# Patient Record
Sex: Female | Born: 1945 | Race: White | Hispanic: No | Smoking: Never smoker
Health system: Southern US, Community
[De-identification: ages and names within clinical notes are randomized; demographics above are authoritative.]

## PROBLEM LIST (undated history)

## (undated) DIAGNOSIS — R112 Nausea with vomiting, unspecified: Secondary | ICD-10-CM

## (undated) DIAGNOSIS — R590 Localized enlarged lymph nodes: Secondary | ICD-10-CM

## (undated) DIAGNOSIS — Z780 Asymptomatic menopausal state: Secondary | ICD-10-CM

## (undated) DIAGNOSIS — M4317 Spondylolisthesis, lumbosacral region: Secondary | ICD-10-CM

## (undated) DIAGNOSIS — I10 Essential (primary) hypertension: Secondary | ICD-10-CM

## (undated) DIAGNOSIS — H539 Unspecified visual disturbance: Secondary | ICD-10-CM

## (undated) DIAGNOSIS — Z9889 Other specified postprocedural states: Secondary | ICD-10-CM

## (undated) DIAGNOSIS — J45909 Unspecified asthma, uncomplicated: Secondary | ICD-10-CM

## (undated) DIAGNOSIS — K219 Gastro-esophageal reflux disease without esophagitis: Secondary | ICD-10-CM

## (undated) DIAGNOSIS — N200 Calculus of kidney: Secondary | ICD-10-CM

## (undated) DIAGNOSIS — E785 Hyperlipidemia, unspecified: Secondary | ICD-10-CM

## (undated) DIAGNOSIS — R7303 Prediabetes: Secondary | ICD-10-CM

## (undated) DIAGNOSIS — R7309 Other abnormal glucose: Secondary | ICD-10-CM

## (undated) DIAGNOSIS — N6009 Solitary cyst of unspecified breast: Secondary | ICD-10-CM

## (undated) DIAGNOSIS — Z9289 Personal history of other medical treatment: Secondary | ICD-10-CM

## (undated) DIAGNOSIS — M8580 Other specified disorders of bone density and structure, unspecified site: Secondary | ICD-10-CM

## (undated) DIAGNOSIS — T7840XA Allergy, unspecified, initial encounter: Secondary | ICD-10-CM

## (undated) HISTORY — DX: Other abnormal glucose: R73.09

## (undated) HISTORY — DX: Spondylolisthesis, lumbosacral region: M43.17

## (undated) HISTORY — DX: Other specified disorders of bone density and structure, unspecified site: M85.80

## (undated) HISTORY — DX: Unspecified visual disturbance: H53.9

## (undated) HISTORY — DX: Unspecified asthma, uncomplicated: J45.909

## (undated) HISTORY — PX: LYMPH NODE BIOPSY: SHX201

## (undated) HISTORY — DX: Personal history of other medical treatment: Z92.89

## (undated) HISTORY — DX: Calculus of kidney: N20.0

## (undated) HISTORY — DX: Asymptomatic menopausal state: Z78.0

## (undated) HISTORY — DX: Solitary cyst of unspecified breast: N60.09

## (undated) HISTORY — DX: Localized enlarged lymph nodes: R59.0

## (undated) HISTORY — PX: CHOLECYSTECTOMY: SHX55

## (undated) HISTORY — DX: Hyperlipidemia, unspecified: E78.5

## (undated) HISTORY — DX: Gastro-esophageal reflux disease without esophagitis: K21.9

## (undated) HISTORY — DX: Allergy, unspecified, initial encounter: T78.40XA

## (undated) HISTORY — DX: Essential (primary) hypertension: I10

---

## 1982-06-13 DIAGNOSIS — N200 Calculus of kidney: Secondary | ICD-10-CM

## 1982-06-13 HISTORY — DX: Calculus of kidney: N20.0

## 1982-06-13 HISTORY — PX: LITHOTRIPSY: SUR834

## 1998-11-30 ENCOUNTER — Other Ambulatory Visit: Admission: RE | Admit: 1998-11-30 | Discharge: 1998-11-30 | Payer: Self-pay | Admitting: Internal Medicine

## 1999-08-24 ENCOUNTER — Encounter: Admission: RE | Admit: 1999-08-24 | Discharge: 1999-08-24 | Payer: Self-pay | Admitting: Cardiovascular Disease

## 1999-12-16 ENCOUNTER — Encounter: Payer: Self-pay | Admitting: Internal Medicine

## 1999-12-16 ENCOUNTER — Encounter: Admission: RE | Admit: 1999-12-16 | Discharge: 1999-12-16 | Payer: Self-pay | Admitting: Internal Medicine

## 2000-10-09 ENCOUNTER — Encounter (INDEPENDENT_AMBULATORY_CARE_PROVIDER_SITE_OTHER): Payer: Self-pay

## 2000-10-09 ENCOUNTER — Ambulatory Visit (HOSPITAL_COMMUNITY): Admission: RE | Admit: 2000-10-09 | Discharge: 2000-10-09 | Payer: Self-pay | Admitting: Gastroenterology

## 2000-12-20 ENCOUNTER — Encounter: Admission: RE | Admit: 2000-12-20 | Discharge: 2000-12-20 | Payer: Self-pay | Admitting: Emergency Medicine

## 2000-12-20 ENCOUNTER — Encounter: Payer: Self-pay | Admitting: Emergency Medicine

## 2002-01-25 ENCOUNTER — Encounter: Payer: Self-pay | Admitting: Family Medicine

## 2002-01-25 ENCOUNTER — Encounter: Admission: RE | Admit: 2002-01-25 | Discharge: 2002-01-25 | Payer: Self-pay | Admitting: Family Medicine

## 2002-04-11 ENCOUNTER — Encounter: Admission: RE | Admit: 2002-04-11 | Discharge: 2002-04-11 | Payer: Self-pay | Admitting: Gastroenterology

## 2002-04-11 ENCOUNTER — Encounter: Payer: Self-pay | Admitting: Gastroenterology

## 2002-05-29 ENCOUNTER — Encounter: Payer: Self-pay | Admitting: Surgery

## 2002-06-03 ENCOUNTER — Ambulatory Visit (HOSPITAL_COMMUNITY): Admission: RE | Admit: 2002-06-03 | Discharge: 2002-06-04 | Payer: Self-pay | Admitting: Surgery

## 2002-06-03 ENCOUNTER — Encounter (INDEPENDENT_AMBULATORY_CARE_PROVIDER_SITE_OTHER): Payer: Self-pay | Admitting: *Deleted

## 2003-01-28 ENCOUNTER — Encounter: Payer: Self-pay | Admitting: Family Medicine

## 2003-01-28 ENCOUNTER — Encounter: Admission: RE | Admit: 2003-01-28 | Discharge: 2003-01-28 | Payer: Self-pay | Admitting: Family Medicine

## 2004-01-29 ENCOUNTER — Encounter: Admission: RE | Admit: 2004-01-29 | Discharge: 2004-01-29 | Payer: Self-pay | Admitting: Family Medicine

## 2004-04-23 ENCOUNTER — Other Ambulatory Visit: Admission: RE | Admit: 2004-04-23 | Discharge: 2004-04-23 | Payer: Self-pay | Admitting: Family Medicine

## 2005-03-03 ENCOUNTER — Encounter: Admission: RE | Admit: 2005-03-03 | Discharge: 2005-03-03 | Payer: Self-pay | Admitting: Family Medicine

## 2005-04-28 ENCOUNTER — Other Ambulatory Visit: Admission: RE | Admit: 2005-04-28 | Discharge: 2005-04-28 | Payer: Self-pay | Admitting: Family Medicine

## 2005-09-14 ENCOUNTER — Ambulatory Visit: Payer: Self-pay | Admitting: Cardiovascular Disease

## 2006-03-08 ENCOUNTER — Encounter: Admission: RE | Admit: 2006-03-08 | Discharge: 2006-03-08 | Payer: Self-pay | Admitting: Family Medicine

## 2006-07-12 ENCOUNTER — Other Ambulatory Visit: Admission: RE | Admit: 2006-07-12 | Discharge: 2006-07-12 | Payer: Self-pay | Admitting: Family Medicine

## 2006-07-21 ENCOUNTER — Ambulatory Visit (HOSPITAL_COMMUNITY): Admission: RE | Admit: 2006-07-21 | Discharge: 2006-07-21 | Payer: Self-pay | Admitting: Family Medicine

## 2006-10-31 ENCOUNTER — Ambulatory Visit (HOSPITAL_COMMUNITY): Admission: RE | Admit: 2006-10-31 | Discharge: 2006-10-31 | Payer: Self-pay | Admitting: Urology

## 2007-04-24 ENCOUNTER — Encounter: Admission: RE | Admit: 2007-04-24 | Discharge: 2007-04-24 | Payer: Self-pay | Admitting: Family Medicine

## 2007-06-14 DIAGNOSIS — Z9289 Personal history of other medical treatment: Secondary | ICD-10-CM

## 2007-06-14 HISTORY — DX: Personal history of other medical treatment: Z92.89

## 2008-02-01 ENCOUNTER — Ambulatory Visit: Payer: Self-pay | Admitting: Cardiovascular Disease

## 2008-02-01 LAB — CONVERTED CEMR LAB
BUN: 15 mg/dL (ref 6–23)
CO2: 31 meq/L (ref 19–32)
Calcium: 9.5 mg/dL (ref 8.4–10.5)
Creatinine, Ser: 0.9 mg/dL (ref 0.4–1.2)
GFR calc non Af Amer: 68 mL/min

## 2008-02-07 ENCOUNTER — Ambulatory Visit: Payer: Self-pay | Admitting: Cardiovascular Disease

## 2008-02-07 ENCOUNTER — Ambulatory Visit (HOSPITAL_COMMUNITY): Admission: RE | Admit: 2008-02-07 | Discharge: 2008-02-07 | Payer: Self-pay | Admitting: Cardiovascular Disease

## 2008-04-24 ENCOUNTER — Encounter: Admission: RE | Admit: 2008-04-24 | Discharge: 2008-04-24 | Payer: Self-pay | Admitting: Family Medicine

## 2008-12-03 ENCOUNTER — Ambulatory Visit (HOSPITAL_COMMUNITY): Admission: RE | Admit: 2008-12-03 | Discharge: 2008-12-03 | Payer: Self-pay | Admitting: Family Medicine

## 2009-04-30 ENCOUNTER — Encounter: Admission: RE | Admit: 2009-04-30 | Discharge: 2009-04-30 | Payer: Self-pay | Admitting: Family Medicine

## 2010-04-06 ENCOUNTER — Ambulatory Visit (HOSPITAL_COMMUNITY)
Admission: RE | Admit: 2010-04-06 | Discharge: 2010-04-06 | Payer: Self-pay | Source: Home / Self Care | Admitting: Surgery

## 2010-04-14 ENCOUNTER — Ambulatory Visit: Payer: Self-pay | Admitting: Oncology

## 2010-04-15 LAB — CBC WITH DIFFERENTIAL/PLATELET
BASO%: 0.2 % (ref 0.0–2.0)
EOS%: 5.9 % (ref 0.0–7.0)
LYMPH%: 14.7 % (ref 14.0–49.7)
MCH: 30.4 pg (ref 25.1–34.0)
MCHC: 34.2 g/dL (ref 31.5–36.0)
MONO#: 0.9 10*3/uL (ref 0.1–0.9)
NEUT%: 68.9 % (ref 38.4–76.8)
Platelets: 255 10*3/uL (ref 145–400)
RBC: 4.45 10*6/uL (ref 3.70–5.45)
WBC: 8.3 10*3/uL (ref 3.9–10.3)

## 2010-04-15 LAB — MORPHOLOGY: PLT EST: ADEQUATE

## 2010-04-16 LAB — COMPREHENSIVE METABOLIC PANEL
AST: 17 U/L (ref 0–37)
Albumin: 4.5 g/dL (ref 3.5–5.2)
BUN: 17 mg/dL (ref 6–23)
CO2: 23 mEq/L (ref 19–32)
Calcium: 9 mg/dL (ref 8.4–10.5)
Chloride: 106 mEq/L (ref 96–112)
Creatinine, Ser: 0.73 mg/dL (ref 0.40–1.20)
Glucose, Bld: 111 mg/dL — ABNORMAL HIGH (ref 70–99)
Potassium: 4.2 mEq/L (ref 3.5–5.3)

## 2010-04-16 LAB — ANA: Anti Nuclear Antibody(ANA): NEGATIVE

## 2010-04-16 LAB — RHEUMATOID FACTOR: Rhuematoid fact SerPl-aCnc: <20 IU/mL (ref 0–20)

## 2010-04-16 LAB — URIC ACID: Uric Acid, Serum: 4.2 mg/dL (ref 2.4–7.0)

## 2010-04-16 LAB — SEDIMENTATION RATE: Sed Rate: 4 mm/hr (ref 0–22)

## 2010-04-16 LAB — LACTATE DEHYDROGENASE: LDH: 161 U/L (ref 94–250)

## 2010-04-29 LAB — EPSTEIN-BARR VIRUS NUCLEAR ANTIGEN ANTIBODY, IGG: EBV NA IgG: 3.42 {ISR} — ABNORMAL HIGH

## 2010-04-29 LAB — EPSTEIN-BARR VIRUS VCA, IGG: EBV VCA IgG: 1.24 {ISR} — ABNORMAL HIGH

## 2010-04-29 LAB — EPSTEIN-BARR VIRUS EARLY D ANTIGEN ANTIBODY, IGG: EBV EA IgG: 0.84 {ISR}

## 2010-04-29 LAB — EPSTEIN-BARR VIRUS VCA, IGM: EBV VCA IgM: 0.18 {ISR}

## 2010-05-03 ENCOUNTER — Encounter: Admission: RE | Admit: 2010-05-03 | Discharge: 2010-05-03 | Payer: Self-pay | Admitting: Family Medicine

## 2010-06-24 ENCOUNTER — Ambulatory Visit: Payer: Self-pay | Admitting: Oncology

## 2010-06-28 LAB — CBC WITH DIFFERENTIAL/PLATELET
BASO%: 0.2 % (ref 0.0–2.0)
Basophils Absolute: 0 10*3/uL (ref 0.0–0.1)
EOS%: 2.5 % (ref 0.0–7.0)
Eosinophils Absolute: 0.2 10*3/uL (ref 0.0–0.5)
HCT: 42.8 % (ref 34.8–46.6)
HGB: 14.6 g/dL (ref 11.6–15.9)
LYMPH%: 15.3 % (ref 14.0–49.7)
MCH: 30.1 pg (ref 25.1–34.0)
MCHC: 34 g/dL (ref 31.5–36.0)
MCV: 88.4 fL (ref 79.5–101.0)
MONO#: 0.6 10*3/uL (ref 0.1–0.9)
MONO%: 8.2 % (ref 0.0–14.0)
NEUT#: 5 10*3/uL (ref 1.5–6.5)
NEUT%: 73.8 % (ref 38.4–76.8)
Platelets: 216 10*3/uL (ref 145–400)
RBC: 4.84 10*6/uL (ref 3.70–5.45)
RDW: 13.6 % (ref 11.2–14.5)
WBC: 6.8 10*3/uL (ref 3.9–10.3)
lymph#: 1 10*3/uL (ref 0.9–3.3)

## 2010-06-28 LAB — COMPREHENSIVE METABOLIC PANEL
ALT: 22 U/L (ref 0–35)
AST: 27 U/L (ref 0–37)
Albumin: 4.3 g/dL (ref 3.5–5.2)
Alkaline Phosphatase: 47 U/L (ref 39–117)
BUN: 16 mg/dL (ref 6–23)
CO2: 30 mEq/L (ref 19–32)
Calcium: 10 mg/dL (ref 8.4–10.5)
Chloride: 102 mEq/L (ref 96–112)
Creatinine, Ser: 0.91 mg/dL (ref 0.40–1.20)
Glucose, Bld: 122 mg/dL — ABNORMAL HIGH (ref 70–99)
Potassium: 4.3 mEq/L (ref 3.5–5.3)
Sodium: 140 mEq/L (ref 135–145)
Total Bilirubin: 1 mg/dL (ref 0.3–1.2)
Total Protein: 7.7 g/dL (ref 6.0–8.3)

## 2010-07-05 ENCOUNTER — Encounter: Payer: Self-pay | Admitting: Cardiovascular Disease

## 2010-07-06 ENCOUNTER — Other Ambulatory Visit: Payer: Self-pay | Admitting: Oncology

## 2010-07-06 DIAGNOSIS — R0989 Other specified symptoms and signs involving the circulatory and respiratory systems: Secondary | ICD-10-CM

## 2010-07-20 ENCOUNTER — Other Ambulatory Visit: Payer: Self-pay | Admitting: Oncology

## 2010-07-20 DIAGNOSIS — R591 Generalized enlarged lymph nodes: Secondary | ICD-10-CM

## 2010-08-25 LAB — BASIC METABOLIC PANEL
CO2: 29 mEq/L (ref 19–32)
Calcium: 9.3 mg/dL (ref 8.4–10.5)
Creatinine, Ser: 0.87 mg/dL (ref 0.4–1.2)
GFR calc Af Amer: >60 mL/min (ref >60)
GFR calc non Af Amer: >60 mL/min (ref >60)
Sodium: 141 mEq/L (ref 135–145)

## 2010-08-25 LAB — SURGICAL PCR SCREEN: MRSA, PCR: NEGATIVE

## 2010-10-26 NOTE — Assessment & Plan Note (Signed)
[facility]                            CARDIOLOGY OFFICE NOTE   BETTIE, CAPISTRAN                          MRN:          [id number]  DATE:02/01/2008                            DOB:          08/14/1945    Ms. Cathy [name] is a delightful 65 year old patient referred by Dr. Gilmore Laroche.  We have seen her in the past in 2005.  She has had multiple  previous episodes of somewhat atypical chest pain.  At one point, she  had significant gallbladder disease.   The patient had an episode about 3 weeks ago of substernal chest pain.  It was while she was outside working.  She had cramping in the left side  of her chest radiating to her arm.  It lasted for about 30 minutes.  She  subsequently had an episode of lightheadedness and palpitations.   She has had one recurrence since that time.  Her son, Dr. Gilmore Laroche,  subsequently did a treadmill test on January 09, 2008, on the patient.   She went 7 minutes on a Bruce protocol and had a positive  electrocardiographic response.   She is referred here for further workup.  In talking to [name], she has had  a couple of episodes of chest pain.  Her risk factors include,  hypercholesterolemia and hypertension.   We had a Myoview study from September 2005, which was normal on her.  Interestingly, at that time, she had a positive electrocardiographic  response as well.  She has been doing well over the last week without a  recurrence.   After lengthy discussion with [name], I think the best thing to do is refer  the patient for a cardiac CT.  She need some sort of imaging modality  and I do not think she would require a invasive catheterization at this  point; however, the positive electrocardiographic stress test needs to  be dealt with.   She seems to be happy with this idea.   ALLERGIES:  She has no known drug allergies.   PAST MEDICAL HISTORY:  Otherwise remarkable for hypertension,  hyperlipidemia.   FAMILY  HISTORY:  Coronary artery disease, noncardiac chest pain, and  history of cholelithiasis.   She is widowed.  I actually remember reading an article about Dr. Gilmore Laroche and how he became a physician after his father died suddenly of  a heart attack.  Elijah Birk is [name]'s only son.  She has a 22-year-old grandson,  which she spoils.  She does not smoke or drink.  She is otherwise  active.   FAMILY HISTORY:  Remarkable for premature coronary disease.   CURRENT MEDICATIONS:  1. Metoprolol 25 b.i.d.  She had previously been on Toprol 25 a day,      but was switched when there was a Sport and exercise psychologist of long-acting      succinate.  2. Ranitidine 150 a day.  3. Benicar 40/12.5.  4. Naprosyn p.r.n.  5. Simvastatin 80 a day  6. Aspirin a day.  7. Multivitamins.   She uses the [facility] in [place] off  [address].   PHYSICAL EXAMINATION:  GENERAL:  Remarkable for a pleasant white female  in no distress.  Affect appropriate.  VITAL SIGNS:  Weight 164, respiratory rate 14, afebrile, blood pressure  137/84, pulse 100 and regular.  HEENT:  Unremarkable.  NECK:  Carotids are without bruit.  No lymphadenopathy, thyromegaly, or  JVP elevation.  LUNGS:  Clear.  Good diaphragmatic motion.  No wheezing.  HEART:  S1 and S2.  Normal heart sounds.  PMI normal.  ABDOMEN:  Benign.  Bowel sounds positive.  No AAA.  No tenderness.  No  bruit.  No hepatosplenomegaly or hepatojugular reflux.  No tenderness.  EXTREMITIES:  Distal pulses are intact.  No edema.  NEURO:  Nonfocal.  SKIN:  Warm and dry.  MUSCULOSKELETAL:  No muscular weakness.   Strips from her treadmill were reviewed.  Her baseline EKG appeared  fairly normal with minor flat ST-segments, but she clearly had 1.5 mm of  ST-segment depression at the end of her treadmill test.   IMPRESSION:  1. Chest pain, multiple coronary artery risk factors, and abnormal      treadmill test.  We would like to refer the patient for cardiac CT.      I do  not think invasive cardiac catheterization is warranted at      this point.  As long as this can be approved, we will go ahead with      this, if not, I think she should have a followup stress Myoview.      We do have a comparison for that in 2005.  2. Hypertension, well controlled.  Continue current dose of Benicar      and beta-blocker.  3. Hypercholesterolemia, continue simvastatin.  Lipid and liver      profile in 6 months.  4. History of reflux, continue ranitidine.  Avoid spicy foods and late-      night meals.   Further recommendation will be based on the results of her followup  stress testing.     Noralyn Pick. Eden Emms, MD, [facility]  Electronically Signed    PCN/MedQ  DD: 02/01/2008  DT: 02/02/2008  Job #: [id number]

## 2010-10-29 NOTE — Procedures (Signed)
[facility]  Patient:    Cathy [name], Cathy [name]                        MRN: [id number] Proc. Date: 10/09/00 Adm. Date:  [id number] Attending:  Rich Brave CC:         Earl Lites, M.D.   Procedure Report  PROCEDURE:  Colonoscopy with biopsy.  INDICATIONS FOR PROCEDURE:  A 65 year old female with Hemoccult positive stool while on aspirin, with negative upper endoscopy (off aspirin x 2 weeks).  FINDINGS:  Diminutive cecal polyp. Possible early sigmoid diverticulosis.  DESCRIPTION OF PROCEDURE:  The nature, purpose and risk of the procedure had been reviewed with the patient who provided written consent. Sedation for this procedure and the upper endoscopy which preceded it totaled fentanyl 125 mcg and Versed 10 mg IV appendiceal orifice arrhythmias or desaturation. Digital exam showed generous anal sphincter tone without frank anal sphincteric stricturing. The Olympus adult video colonoscope was advanced to the cecum without too much difficulty, turning the patient into the supine position to help negotiate passage through the sigmoid region.  In the cecum, near its base was a 1-2 mm small hyperplastic-appearing polyp removed by a single cold biopsy or two. No large polyp, cancer, colitis, vascular malformations or definite diverticular disease were observed although there was a question of some early diverticular change in the sigmoid region. Retroflexion was attempted but could not be accomplished in the rectum. Pullout through the anal canal demonstrated a slightly excoriated anal canal.  Retroflexion could not be accomplished in the rectum due to a relatively small rectal ampulla.  Apart from a 2 mm cecal polyp which was cold biopsied x 1, in the question of the above-mentioned early diverticular change in the sigmoid region, this was a normal colonoscopy. The quality of the prep was very good and it was felt that all areas were well seen. No  large polyps, cancer, colitis, or vascular malformations were observed.  The patient tolerated the procedure well and there were no apparent complications.  IMPRESSION: 1. Early cecal polyp. 2. Possible early sigmoid diverticulosis. 3. No source for heme positivity identified.  PLAN:  P.R.N. follow-up. Could consider a repeat set of Hemoccults at the primary physicians discretion. DD:  10/09/00 TD:  10/09/00 Job: [id number]

## 2010-10-29 NOTE — Op Note (Signed)
NAME:  Cathy [name], Cathy [name]                           ACCOUNT NO.:  [id number]   MEDICAL RECORD NO.:  [id number]                   PATIENT TYPE:  OIB   LOCATION:  NA                                   FACILITY:  [facility]   PHYSICIAN:  Abigail Miyamoto, M.D.              DATE OF BIRTH:  1945/11/22   DATE OF PROCEDURE:  06/03/2002  DATE OF DISCHARGE:                                 OPERATIVE REPORT   PREOPERATIVE DIAGNOSIS:  Symptomatic cholelithiasis.   POSTOPERATIVE DIAGNOSIS:  Symptomatic cholelithiasis.   OPERATION PERFORMED:  Laparoscopic cholecystectomy.   SURGEON:  Douglas A. Magnus Ivan, M.D.   ASSISTANT:  Donnie Coffin. Samuella Cota, M.D.   ANESTHESIA:  General endotracheal and 0.25% Marcaine.   ESTIMATED BLOOD LOSS:  Minimal.   DESCRIPTION OF PROCEDURE:  The patient was brought to the operating room and  identified.  She was placed supine on the operating table and general  anesthesia was induced.  Her abdomen was then prepped and draped in the  usual sterile fashion.  Using a #15 blade, a small vertical incision was  made at the umbilicus.  The incision was carried down to the fascia which  was then opened with a scalpel.  Hemostat was introduced and passed into the  peritoneal cavity. Next, a 0 Vicryl pursestring suture was placed around the  fascial opening.  The Hasson port was placed through the opening and  insufflation of the abdomen was begun.  An 11 mm port was then placed in the  patient's epigastrium and two 5 mm ports placed in the patient's right flank  under direct vision.  The gallbladder was then elevated and retracted above  the liver bed.  Dissection was then carried out at the base of the  gallbladder.  A small bridging vein was identified and clipped twice  proximally, once distally and transected.  The cystic duct was then  dissected out and clipped three times proximally, once distally and  transected with the scissors as well.  The cystic artery was likewise  identified, clipped twice proximally and once distally and transected.  The  gallbladder was then easily dissected free from the liver bed with the  electrocautery.  The gallbladder was thickwalled in appearance and contained  small stones.  Once the gallbladder was completely excised from the liver  bed, the liver bed was examined and hemostasis was achieved.  The  gallbladder was then removed through the incision at the umbilicus.  The 0  Vicryl at the umbilicus was then tied in placed closing the fascial defect.  The liver bed was then examined.  Hemostasis was achieved.  All incisions  were then anesthetized with 0.25% Marcaine, then closed with 4-0 Vicryl  subcuticular sutures.  Steri-Strips, gauze and tape were then applied.  The  patient tolerated the procedure well.  All sponge, needle and instrument  counts were correct at the end of the procedure.  The patient was then  extubated in the operating room and taken in stable condition to the  recovery room.                                                   Abigail Miyamoto, M.D.    DB/MEDQ  D:  06/03/2002  T:  06/03/2002  Job:  [id number]   cc:   Bernette Redbird, M.D.  [address] [place]  [place], [place] [address]  Fax: [phone]

## 2010-12-06 ENCOUNTER — Other Ambulatory Visit: Payer: Self-pay | Admitting: Oncology

## 2010-12-06 ENCOUNTER — Encounter (HOSPITAL_BASED_OUTPATIENT_CLINIC_OR_DEPARTMENT_OTHER): Payer: BC Managed Care – PPO | Admitting: Oncology

## 2010-12-06 DIAGNOSIS — D360 Benign neoplasm of lymph nodes: Secondary | ICD-10-CM

## 2010-12-06 LAB — COMPREHENSIVE METABOLIC PANEL
ALT: 17 U/L (ref 0–35)
AST: 21 U/L (ref 0–37)
Albumin: 4.5 g/dL (ref 3.5–5.2)
CO2: 26 mEq/L (ref 19–32)
Calcium: 9.9 mg/dL (ref 8.4–10.5)
Chloride: 105 mEq/L (ref 96–112)
Potassium: 4.3 mEq/L (ref 3.5–5.3)
Sodium: 142 mEq/L (ref 135–145)
Total Protein: 6.9 g/dL (ref 6.0–8.3)

## 2010-12-06 LAB — TOXOPLASMA GONDII ANTIBODY, IGG: Toxoplasma IgG Ratio: 67.6 IU/mL — ABNORMAL HIGH (ref <6.4)

## 2010-12-06 LAB — CBC WITH DIFFERENTIAL/PLATELET
BASO%: 0.3 % (ref 0.0–2.0)
EOS%: 3.6 % (ref 0.0–7.0)
HCT: 41.1 % (ref 34.8–46.6)
LYMPH%: 16.1 % (ref 14.0–49.7)
MCH: 29.9 pg (ref 25.1–34.0)
MCHC: 34 g/dL (ref 31.5–36.0)
NEUT%: 68.3 % (ref 38.4–76.8)
Platelets: 199 10*3/uL (ref 145–400)
RBC: 4.66 10*6/uL (ref 3.70–5.45)
WBC: 5.5 10*3/uL (ref 3.9–10.3)
lymph#: 0.9 10*3/uL (ref 0.9–3.3)

## 2010-12-06 LAB — SEDIMENTATION RATE: Sed Rate: 4 mm/hr (ref 0–22)

## 2010-12-06 LAB — LACTATE DEHYDROGENASE: LDH: 150 U/L (ref 94–250)

## 2010-12-06 LAB — CHCC SMEAR

## 2010-12-07 LAB — TOXOPLASMA GONDII ANTIBODY, IGM

## 2010-12-20 ENCOUNTER — Inpatient Hospital Stay (HOSPITAL_COMMUNITY): Admission: RE | Admit: 2010-12-20 | Payer: Self-pay | Source: Ambulatory Visit

## 2010-12-20 ENCOUNTER — Other Ambulatory Visit (HOSPITAL_COMMUNITY): Payer: Self-pay

## 2010-12-28 ENCOUNTER — Encounter (HOSPITAL_BASED_OUTPATIENT_CLINIC_OR_DEPARTMENT_OTHER): Payer: BC Managed Care – PPO | Admitting: Oncology

## 2010-12-28 DIAGNOSIS — D360 Benign neoplasm of lymph nodes: Secondary | ICD-10-CM

## 2011-03-28 ENCOUNTER — Other Ambulatory Visit: Payer: Self-pay | Admitting: Family Medicine

## 2011-03-28 DIAGNOSIS — Z1231 Encounter for screening mammogram for malignant neoplasm of breast: Secondary | ICD-10-CM

## 2011-05-09 ENCOUNTER — Ambulatory Visit
Admission: RE | Admit: 2011-05-09 | Discharge: 2011-05-09 | Disposition: A | Discharge status: Home / Self Care | Payer: BC Managed Care – PPO | Source: Ambulatory Visit | Attending: Family Medicine | Admitting: Family Medicine

## 2011-05-09 DIAGNOSIS — Z1231 Encounter for screening mammogram for malignant neoplasm of breast: Secondary | ICD-10-CM

## 2011-05-19 ENCOUNTER — Telehealth: Payer: Self-pay | Admitting: Oncology

## 2011-05-19 NOTE — Telephone Encounter (Signed)
Called [facility] today to schedule a CT , computer was down, was given another number to call. Called the number and left message to call me directly on my extension. Called pt tonight to update, left message

## 2011-05-20 ENCOUNTER — Telehealth: Payer: Self-pay | Admitting: Oncology

## 2011-05-20 NOTE — Telephone Encounter (Signed)
Called [facility] to schedule CT Chest, Ct will be done 07/28/10 10 am.Fax order result is ok. Called pt,left message, instructed to be NPO 4 hrs prior to scan

## 2011-06-17 ENCOUNTER — Telehealth: Payer: Self-pay | Admitting: Oncology

## 2011-06-17 NOTE — Telephone Encounter (Signed)
Called [facility] and added the ct scxan of the neck to the chest appt that was already scheduled

## 2011-07-22 ENCOUNTER — Telehealth: Payer: Self-pay | Admitting: *Deleted

## 2011-07-22 NOTE — Telephone Encounter (Signed)
Received call from Rachel/[facility] stating they need an order for CT neck that was added to chest CT.  Order from 12/28/10 for 06/28/11 faxed to [phone].  She is scheduled for Monday per Fleet Contras.

## 2011-07-27 ENCOUNTER — Ambulatory Visit (HOSPITAL_BASED_OUTPATIENT_CLINIC_OR_DEPARTMENT_OTHER): Payer: BC Managed Care – PPO

## 2011-07-27 ENCOUNTER — Other Ambulatory Visit: Payer: BC Managed Care – PPO | Admitting: Lab

## 2011-07-27 DIAGNOSIS — D360 Benign neoplasm of lymph nodes: Secondary | ICD-10-CM

## 2011-07-27 LAB — CBC WITH DIFFERENTIAL/PLATELET
BASO%: 0.2 % (ref 0.0–2.0)
Basophils Absolute: 0 10*3/uL (ref 0.0–0.1)
EOS%: 0.8 % (ref 0.0–7.0)
HGB: 14.9 g/dL (ref 11.6–15.9)
MCH: 30.1 pg (ref 25.1–34.0)
RBC: 4.94 10*6/uL (ref 3.70–5.45)
RDW: 13.3 % (ref 11.2–14.5)
lymph#: 1.2 10*3/uL (ref 0.9–3.3)

## 2011-07-27 LAB — MORPHOLOGY: PLT EST: ADEQUATE

## 2011-07-28 LAB — BASIC METABOLIC PANEL
CO2: 26 mEq/L (ref 19–32)
Chloride: 102 mEq/L (ref 96–112)
Sodium: 141 mEq/L (ref 135–145)

## 2011-07-29 ENCOUNTER — Other Ambulatory Visit: Payer: BC Managed Care – PPO | Admitting: Lab

## 2011-08-01 ENCOUNTER — Ambulatory Visit (HOSPITAL_BASED_OUTPATIENT_CLINIC_OR_DEPARTMENT_OTHER): Payer: BC Managed Care – PPO | Admitting: Oncology

## 2011-08-01 ENCOUNTER — Encounter: Payer: Self-pay | Admitting: Oncology

## 2011-08-01 ENCOUNTER — Telehealth: Payer: Self-pay | Admitting: Oncology

## 2011-08-01 VITALS — BP 133/79 | HR 95 | Temp 99.0°F | Ht 64.0 in | Wt 161.5 lb

## 2011-08-01 DIAGNOSIS — R590 Localized enlarged lymph nodes: Secondary | ICD-10-CM | POA: Insufficient documentation

## 2011-08-01 DIAGNOSIS — R599 Enlarged lymph nodes, unspecified: Secondary | ICD-10-CM

## 2011-08-01 HISTORY — DX: Localized enlarged lymph nodes: R59.0

## 2011-08-01 NOTE — Telephone Encounter (Signed)
2014 appts made and printed for pt.  Ct order faxed to [facility](1 [phone]) they will call the pt with her appt.

## 2011-08-02 ENCOUNTER — Encounter: Payer: Self-pay | Admitting: Oncology

## 2011-08-02 NOTE — Progress Notes (Signed)
Hematology and Oncology Follow Up Visit  Cathy [name] [id number] [birth year] 66 y.o. 08/02/2011 12:29 PM   Principle Diagnosis: Encounter Diagnoses  Name Primary?  . Lymphadenopathy of left cervical region Yes  . 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 She initially presented in October 2011 with a lymph node in the left supraclavicular area. Biopsy 04/06/2010 showed atypical lymphoid hyperplasia. Pathology sent to the [facility] [place] second opinion. This appeared to be a T-cell process with increased CD4 positive T. lymphocytes but a firm diagnosis of lymphoma could not be established. CT scans of the neck chest abdomen and pelvis showed adenopathy in the left supraclavicular and left axillary regions and a single large right paratracheal lymph node 2 cm in diameter and 5-6 cm in length. No abdominal or pelvic adenopathy and no splenomegaly. She had no constitutional symptoms. Lab testing showed previous exposure to EBV and CMV  viruses with elevated IgG but not IgM titers. There was also a significant elevation of IgG against toxoplasmosis. She had no history of exposure to cats or cat litter. She was not anemic. She had a normal white blood count and differential. Serum LDH normal. ESR 4 mm. I elected to follow her with close observation alone and serial CT scans. I never did a PET scan since I didn't think the results would  change my clinical management. She remains asymptomatic. She denies any fevers, weight loss, night sweats, difficulty breathing, chest pressure, or dysphagia.  We initially checked scans at 4 month intervals and most recent scan was done at a six-month interval on 07/29/2011. I reviewed all of the images with her. There is persistent but unchanged adenopathy in all previous areas of involvement and no new areas of adenopathy. Largest node remains the right paratracheal node 2 x 2 cm transverse and 5 cm  in height. Incidentally reported as a 1.5 cm nodular area in the left breast "lesion is stable" but not mentioned on previous reports. The patient just had a mammogram last month and there were no areas of concern.   Medications: reviewed  Allergies:  Allergies  Allergen Reactions  . Codeine     Unable to recall     Review of Systems: Constitutional:   None see above Respiratory: No cough or dyspnea Cardiovascular:  No ischemic type chest pain pressure or palpitations Gastrointestinal: No abdominal pain or change in bowel habit Genito-Urinary: No urinary tract symptoms Musculoskeletal: No muscle or bone pain Neurologic: No headache or change in vision Skin: No rash or ecchymoses Remaining ROS negative.  Physical Exam: Blood pressure 133/79, pulse 95, temperature 99 F (37.2 C), temperature source Oral, height 5\' 4"  (1.626 m), weight 161 lb 8 oz (73.256 kg). Wt Readings from Last 3 Encounters:  08/01/11 161 lb 8 oz (73.256 kg)     General appearance: Well-nourished Caucasian woman HENNT: Pharynx no erythema or exudate Lymph nodes: Approximate 2 cm lymph node palpable in the area of previous scar left supraclavicular fossa. No other cervical, right supraclavicular, or axillary adenopathy palpable. Breasts: No dominant breast masses Lungs: Clear to auscultation resonant to percussion Heart: Regular cardiac rhythm no murmur or gallop Abdomen: Soft nontender no mass no organomegaly Extremities: No edema no calf tenderness Vascular: No cyanosis Neurologic: Motor strength 5 over 5 reflexes 1+ symmetric Skin: No rash or ecchymosis  Lab Results: Lab Results  Component Value Date   WBC 9.1 07/27/2011   HGB 14.9 07/27/2011  HCT 44.3 07/27/2011   MCV 89.6 07/27/2011   PLT 219 07/27/2011     Chemistry      Component Value Date/Time   NA 141 07/27/2011 1340   K 4.5 07/27/2011 1340   CL 102 07/27/2011 1340   CO2 26 07/27/2011 1340   BUN 18 07/27/2011 1340   CREATININE 0.77  07/27/2011 1340      Component Value Date/Time   CALCIUM 9.8 07/27/2011 1340   ALKPHOS 47 12/06/2010 0830   ALKPHOS 47 12/06/2010 0830   ALKPHOS 47 12/06/2010 0830   ALKPHOS 47 12/06/2010 0830   AST 21 12/06/2010 0830   AST 21 12/06/2010 0830   AST 21 12/06/2010 0830   AST 21 12/06/2010 0830   ALT 17 12/06/2010 0830   ALT 17 12/06/2010 0830   ALT 17 12/06/2010 0830   ALT 17 12/06/2010 0830   BILITOT 0.8 12/06/2010 0830   BILITOT 0.8 12/06/2010 0830   BILITOT 0.8 12/06/2010 0830   BILITOT 0.8 12/06/2010 0830       Radiological Studies: See discussion above. Most recent CT scan of the neck and CT chest done at [facility] 07/29/2011 shows stable adenopathy and no new abnormalities. Incidental mention of a nodular area in the left breast which did not appear abnormal on a recent mammogram done in November 2012. No palpable breast masses on exam.   Impression and Plan: #1. Idiopathic lymphadenopathy No clinical or radiographic changes now with more than 2 years of observation. Am going to schedule her next scans for a one year interval. She will call if she has any interim problems.  #2. Nodular area left breast seen on CT scan but not on mammogram. No palpable breast masses on current physical exam. I would like to get the radiologist to review these studies and determine whether she needs an ultrasound or an MRI of her left breast.    CC:. Dr. Nils Pyle; Dr Baird Lyons; Dr Criselda Peaches, MD 2/19/201312:29 PM

## 2011-08-10 ENCOUNTER — Other Ambulatory Visit: Payer: Self-pay | Admitting: Oncology

## 2011-08-10 DIAGNOSIS — R9389 Abnormal findings on diagnostic imaging of other specified body structures: Secondary | ICD-10-CM

## 2011-08-16 ENCOUNTER — Telehealth: Payer: Self-pay | Admitting: Oncology

## 2011-08-16 NOTE — Telephone Encounter (Signed)
Talked to pt, she is aware of the appt for mammogram and U/S on 08/22/11

## 2011-08-18 ENCOUNTER — Ambulatory Visit
Admission: RE | Admit: 2011-08-18 | Discharge: 2011-08-18 | Disposition: A | Discharge status: Home / Self Care | Payer: BC Managed Care – PPO | Source: Ambulatory Visit | Attending: Oncology | Admitting: Oncology

## 2011-08-18 DIAGNOSIS — R9389 Abnormal findings on diagnostic imaging of other specified body structures: Secondary | ICD-10-CM

## 2011-08-19 ENCOUNTER — Other Ambulatory Visit: Payer: Self-pay | Admitting: Oncology

## 2011-08-19 ENCOUNTER — Ambulatory Visit
Admission: RE | Admit: 2011-08-19 | Discharge: 2011-08-19 | Disposition: A | Discharge status: Home / Self Care | Payer: BC Managed Care – PPO | Source: Ambulatory Visit | Attending: Oncology | Admitting: Oncology

## 2011-08-19 ENCOUNTER — Ambulatory Visit: Admission: RE | Admit: 2011-08-19 | Payer: BC Managed Care – PPO | Source: Ambulatory Visit

## 2011-08-19 DIAGNOSIS — R9389 Abnormal findings on diagnostic imaging of other specified body structures: Secondary | ICD-10-CM

## 2011-08-22 ENCOUNTER — Other Ambulatory Visit: Payer: BC Managed Care – PPO

## 2011-08-25 ENCOUNTER — Encounter: Payer: Self-pay | Admitting: Oncology

## 2011-09-05 ENCOUNTER — Other Ambulatory Visit: Payer: Self-pay | Admitting: Family Medicine

## 2011-09-05 DIAGNOSIS — Z780 Asymptomatic menopausal state: Secondary | ICD-10-CM

## 2011-09-12 ENCOUNTER — Ambulatory Visit
Admission: RE | Admit: 2011-09-12 | Discharge: 2011-09-12 | Disposition: A | Discharge status: Home / Self Care | Payer: BC Managed Care – PPO | Source: Ambulatory Visit | Attending: Family Medicine | Admitting: Family Medicine

## 2011-09-12 DIAGNOSIS — Z780 Asymptomatic menopausal state: Secondary | ICD-10-CM

## 2012-01-12 ENCOUNTER — Telehealth: Payer: Self-pay | Admitting: *Deleted

## 2012-01-12 NOTE — Telephone Encounter (Signed)
Received call from pt stating that she is supposed to have a CT @ [facility] Feb 2014 & no one has called her yet.  She is requesting this to be scheduled.  Message sent to Scheduler/Rose to f/u.

## 2012-01-27 ENCOUNTER — Telehealth: Payer: Self-pay | Admitting: Oncology

## 2012-01-27 NOTE — Telephone Encounter (Signed)
Called pt and left message regarding CT for 2/14, tried to make appt with [facility] per pt's request, [facility] do not have a template for next year and don't know will it open, notified patient and waiting for a call back

## 2012-01-27 NOTE — Telephone Encounter (Signed)
Pt called back,regarding CT appt for February 2014, pt wants to wait and she will call us abck and remind [place] to schedule the CT @ [facility] next year

## 2012-03-19 ENCOUNTER — Other Ambulatory Visit: Payer: Self-pay | Admitting: Oncology

## 2012-03-19 DIAGNOSIS — Z1231 Encounter for screening mammogram for malignant neoplasm of breast: Secondary | ICD-10-CM

## 2012-03-30 ENCOUNTER — Telehealth: Payer: Self-pay | Admitting: Oncology

## 2012-03-30 NOTE — Telephone Encounter (Signed)
Called pt, left message regarding her  appt for CT @ [facility] on 07/28/11 @ 1045 am , pt aware of appt with labs and MD on February 2014

## 2012-05-14 ENCOUNTER — Ambulatory Visit: Payer: Medicare Other

## 2012-05-15 ENCOUNTER — Ambulatory Visit
Admission: RE | Admit: 2012-05-15 | Discharge: 2012-05-15 | Disposition: A | Discharge status: Home / Self Care | Payer: Medicare Other | Source: Ambulatory Visit | Attending: Oncology | Admitting: Oncology

## 2012-05-15 DIAGNOSIS — Z1231 Encounter for screening mammogram for malignant neoplasm of breast: Secondary | ICD-10-CM

## 2012-06-08 ENCOUNTER — Telehealth: Payer: Self-pay | Admitting: Oncology

## 2012-06-08 NOTE — Telephone Encounter (Signed)
Talked to patient and she will move CT @ [facility] lab moved to 2/3  And MD to 2/10 pt aware, r/s due to MD's PAL

## 2012-07-16 ENCOUNTER — Other Ambulatory Visit: Payer: Medicare Other

## 2012-07-16 ENCOUNTER — Other Ambulatory Visit (HOSPITAL_BASED_OUTPATIENT_CLINIC_OR_DEPARTMENT_OTHER): Payer: Medicare Other | Admitting: Lab

## 2012-07-16 DIAGNOSIS — R599 Enlarged lymph nodes, unspecified: Secondary | ICD-10-CM

## 2012-07-16 DIAGNOSIS — R590 Localized enlarged lymph nodes: Secondary | ICD-10-CM

## 2012-07-16 LAB — BASIC METABOLIC PANEL (CC13)
BUN: 14.4 mg/dL (ref 7.0–26.0)
CO2: 25 mEq/L (ref 22–29)
Calcium: 9.6 mg/dL (ref 8.4–10.4)
Creatinine: 0.8 mg/dL (ref 0.6–1.1)
Glucose: 112 mg/dl — ABNORMAL HIGH (ref 70–99)

## 2012-07-16 LAB — CBC WITH DIFFERENTIAL/PLATELET
Basophils Absolute: 0 10*3/uL (ref 0.0–0.1)
EOS%: 4 % (ref 0.0–7.0)
Eosinophils Absolute: 0.4 10*3/uL (ref 0.0–0.5)
HGB: 13.8 g/dL (ref 11.6–15.9)
LYMPH%: 15.5 % (ref 14.0–49.7)
MCH: 29.1 pg (ref 25.1–34.0)
MCV: 87 fL (ref 79.5–101.0)
MONO%: 8.7 % (ref 0.0–14.0)
NEUT#: 7 10*3/uL — ABNORMAL HIGH (ref 1.5–6.5)
Platelets: 274 10*3/uL (ref 145–400)
RDW: 13.4 % (ref 11.2–14.5)

## 2012-07-16 LAB — LACTATE DEHYDROGENASE (CC13): LDH: 212 U/L (ref 125–245)

## 2012-07-23 ENCOUNTER — Ambulatory Visit (HOSPITAL_BASED_OUTPATIENT_CLINIC_OR_DEPARTMENT_OTHER): Payer: Medicare Other | Admitting: Oncology

## 2012-07-23 ENCOUNTER — Telehealth: Payer: Self-pay | Admitting: Oncology

## 2012-07-23 ENCOUNTER — Ambulatory Visit: Payer: BC Managed Care – PPO | Admitting: Oncology

## 2012-07-23 VITALS — BP 126/77 | HR 105 | Temp 97.0°F | Resp 20 | Ht 64.0 in | Wt 166.6 lb

## 2012-07-23 DIAGNOSIS — R590 Localized enlarged lymph nodes: Secondary | ICD-10-CM

## 2012-07-23 DIAGNOSIS — R599 Enlarged lymph nodes, unspecified: Secondary | ICD-10-CM

## 2012-07-23 NOTE — Telephone Encounter (Signed)
Gave pt appt for lab before Ct on 8/11 am pt will have CT on 8/11@ [facility]  9am, pt aware of appt, then see MD on 01/25/13

## 2012-07-23 NOTE — Progress Notes (Signed)
Hematology and Oncology Follow Up Visit  Cathy [name] [id number] [birth year] 67 y.o. 07/23/2012 12:54 PM   Principle Diagnosis: Encounter Diagnosis  Name Primary?  . 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 She presented with asymptomatic left supraclavicular lymphadenopathy over 3 years ago in October 2011. CT scan showed additional right paratracheal lymphadenopathy maximum diameter 2 cm but extending over a 5 cm length. Additional left axillary adenopathy not clinically palpable. Biopsy of the left supraclavicular node was not diagnostic.The specimen was sent for an outside review to the [facility] [place].There was atypical lymphoid hyperplasia but a firm diagnosis of lymphoma could not be established. . The lymph node was very vascular. There was a mixed population of lymphocytes, plasma cells, histiocytes, immunoblasts, and occasional Reed-Sternberg-like cells. Although T cells predominated on immunohistochemical stains, there was no significant T cell expansion. Immunostaining for CMV was negative. FISH study for EBV could not be interpreted due to technical problems.serology in the peripheral blood showed evidence of prior exposure to both CMV and EBV but IgM titers were not elevated.Toxoplasmosis titer was significantly elevatedbut it appears that only an IgG and not an IgM titer was obtained.these titers have come down to undetectable levels over time.  She remains asymptomatic at this time. She denies any constitutional symptoms. Only interim problem occurred last week when she fell down coming out of her attic and fractured some ribs.  Medications: reviewed  Allergies:  Allergies  Allergen Reactions  . Codeine     Unable to recall     Review of Systems: Constitutional:   No constitutional symptoms Respiratory:no cough or dyspnea Cardiovascular:  No chest pain or palpitations Gastrointestinal:no  change in bowel habit Genito-Urinary: not questioned Musculoskeletal:right rib pain after a fall and fracture Neurologic::no headache or change in vision Skin:no rash or ecchymosis Remaining ROS negative.  Physical Exam: Blood pressure 126/77, pulse 105, temperature 97 F (36.1 C), temperature source Oral, resp. rate 20, height 5\' 4"  (1.626 m), weight 166 lb 9.6 oz (75.569 kg). Wt Readings from Last 3 Encounters:  07/23/12 166 lb 9.6 oz (75.569 kg)  08/01/11 161 lb 8 oz (73.256 kg)     General appearance: well-nourished Caucasian woman HENNT: pharynx no erythema or exudate Lymph nodes: persistent 2 cm lymph node palpable left supraclavicular fossa no other areas of adenopathy in the neck, right supraclavicular area, or axillae Breasts: Lungs:clear to auscultation resonant to percussion Heart:regular rhythm no murmur Abdomen:soft, nontender, no mass, no organomegaly Extremities:no edema, no calf tenderness Vascular:no cyanosis Neurologic:no focal deficit Skin:no rash or ecchymosis  Lab Results: Lab Results  Component Value Date   WBC 9.8 07/16/2012   HGB 13.8 07/16/2012   HCT 41.2 07/16/2012   MCV 87.0 07/16/2012   PLT 274 07/16/2012     Chemistry      Component Value Date/Time   NA 139 07/16/2012 1304   NA 141 07/27/2011 1340   K 4.3 07/16/2012 1304   K 4.5 07/27/2011 1340   CL 103 07/16/2012 1304   CL 102 07/27/2011 1340   CO2 25 07/16/2012 1304   CO2 26 07/27/2011 1340   BUN 14.4 07/16/2012 1304   BUN 18 07/27/2011 1340   CREATININE 0.8 07/16/2012 1304   CREATININE 0.77 07/27/2011 1340      Component Value Date/Time   CALCIUM 9.6 07/16/2012 1304   CALCIUM 9.8 07/27/2011 1340   ALKPHOS 47 12/06/2010 0830   AST 21 12/06/2010 0830  ALT 17 12/06/2010 0830   BILITOT 0.8 12/06/2010 0830       Radiological Studies:CT scans of the neck and chest done 07/16/2012 at [facility] and I personally reviewed these images. There has been little change compared  with prior studies. There is stable  intrathoracic, axillary, and left base of neck adenopathy. Some of these nodes are "slightly larger" than on the previous study but there is no new adenopathy. Lymph nodes in the right posterior paratracheal region similar in size and appearance. Fractures in the seventh through 11th rib apparent from recent fall. A small left pleural effusion has developed. .  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  No major changes in the size or shape of these nodes compared with prior studies over the last 3 years. Pathogenesis  remains unclear. I will refer for repeat biopsy if there is any clinical or radiographic change in the future. For now, I will continue every six-month followup visits and scans.   CC:. Dr. Lynnea Ferrier;   Levert Feinstein, MD 2/10/201412:54 PM

## 2012-07-27 ENCOUNTER — Other Ambulatory Visit: Payer: BC Managed Care – PPO | Admitting: Lab

## 2012-08-03 ENCOUNTER — Ambulatory Visit: Payer: BC Managed Care – PPO | Admitting: Oncology

## 2012-08-07 ENCOUNTER — Encounter: Payer: Self-pay | Admitting: Oncology

## 2012-08-07 ENCOUNTER — Ambulatory Visit: Payer: BC Managed Care – PPO | Admitting: Oncology

## 2012-08-11 ENCOUNTER — Encounter: Payer: Self-pay | Admitting: *Deleted

## 2012-08-11 DIAGNOSIS — I10 Essential (primary) hypertension: Secondary | ICD-10-CM | POA: Insufficient documentation

## 2012-08-11 DIAGNOSIS — E785 Hyperlipidemia, unspecified: Secondary | ICD-10-CM | POA: Insufficient documentation

## 2012-08-11 DIAGNOSIS — K219 Gastro-esophageal reflux disease without esophagitis: Secondary | ICD-10-CM | POA: Insufficient documentation

## 2012-09-03 ENCOUNTER — Other Ambulatory Visit: Payer: Self-pay | Admitting: Physician Assistant

## 2012-09-03 ENCOUNTER — Other Ambulatory Visit (INDEPENDENT_AMBULATORY_CARE_PROVIDER_SITE_OTHER): Payer: Medicare Other | Admitting: Family Medicine

## 2012-09-03 DIAGNOSIS — R590 Localized enlarged lymph nodes: Secondary | ICD-10-CM

## 2012-09-03 DIAGNOSIS — I10 Essential (primary) hypertension: Secondary | ICD-10-CM

## 2012-09-03 DIAGNOSIS — E782 Mixed hyperlipidemia: Secondary | ICD-10-CM

## 2012-09-03 DIAGNOSIS — Z79899 Other long term (current) drug therapy: Secondary | ICD-10-CM

## 2012-09-03 DIAGNOSIS — M8580 Other specified disorders of bone density and structure, unspecified site: Secondary | ICD-10-CM

## 2012-09-03 DIAGNOSIS — Z0000 Encounter for general adult medical examination without abnormal findings: Secondary | ICD-10-CM

## 2012-09-03 LAB — CBC WITH DIFFERENTIAL/PLATELET
Eosinophils Absolute: 0.4 10*3/uL (ref 0.0–0.7)
HCT: 44.2 % (ref 36.0–46.0)
Hemoglobin: 15.1 g/dL — ABNORMAL HIGH (ref 12.0–15.0)
Lymphs Abs: 1.5 10*3/uL (ref 0.7–4.0)
MCH: 29.2 pg (ref 26.0–34.0)
Monocytes Absolute: 0.7 10*3/uL (ref 0.1–1.0)
Monocytes Relative: 9 % (ref 3–12)
Neutro Abs: 5.5 10*3/uL (ref 1.7–7.7)
Neutrophils Relative %: 68 % (ref 43–77)
RBC: 5.18 MIL/uL — ABNORMAL HIGH (ref 3.87–5.11)

## 2012-09-03 NOTE — Addendum Note (Signed)
Addended by: WRAY, [place] on: 09/03/2012 09:28 AM   Modules accepted: Orders

## 2012-09-04 ENCOUNTER — Other Ambulatory Visit: Payer: Self-pay | Admitting: *Deleted

## 2012-09-04 DIAGNOSIS — R590 Localized enlarged lymph nodes: Secondary | ICD-10-CM

## 2012-09-04 LAB — COMPLETE METABOLIC PANEL WITH GFR
ALT: 18 U/L (ref 0–35)
BUN: 12 mg/dL (ref 6–23)
CO2: 28 mEq/L (ref 19–32)
Calcium: 10.1 mg/dL (ref 8.4–10.5)
Chloride: 99 mEq/L (ref 96–112)
Creat: 0.73 mg/dL (ref 0.50–1.10)
GFR, Est African American: >89 mL/min
GFR, Est Non African American: 87 mL/min
Glucose, Bld: 133 mg/dL — ABNORMAL HIGH (ref 70–99)
Total Bilirubin: 0.6 mg/dL (ref 0.3–1.2)

## 2012-09-04 LAB — LIPID PANEL
Cholesterol: 172 mg/dL (ref 0–200)
LDL Cholesterol: 111 mg/dL — ABNORMAL HIGH (ref 0–99)
Triglycerides: 84 mg/dL (ref <150)

## 2012-09-04 LAB — HEMOGLOBIN A1C: Hgb A1c MFr Bld: 6.1 % — ABNORMAL HIGH (ref <5.7)

## 2012-09-04 LAB — VITAMIN D 25 HYDROXY (VIT D DEFICIENCY, FRACTURES): Vit D, 25-Hydroxy: 55 ng/mL (ref 30–89)

## 2012-09-06 ENCOUNTER — Telehealth: Payer: Self-pay | Admitting: Physician Assistant

## 2012-09-06 DIAGNOSIS — I10 Essential (primary) hypertension: Secondary | ICD-10-CM

## 2012-09-06 DIAGNOSIS — R590 Localized enlarged lymph nodes: Secondary | ICD-10-CM

## 2012-09-06 MED ORDER — LOSARTAN POTASSIUM 50 MG PO TABS: 50.0000 mg | ORAL_TABLET | Freq: Every day | ORAL | Status: DC

## 2012-09-06 NOTE — Telephone Encounter (Signed)
Medication refilled per protocol. 

## 2012-09-10 ENCOUNTER — Ambulatory Visit (INDEPENDENT_AMBULATORY_CARE_PROVIDER_SITE_OTHER): Payer: Medicare Other | Admitting: Physician Assistant

## 2012-09-10 ENCOUNTER — Encounter: Payer: Self-pay | Admitting: Physician Assistant

## 2012-09-10 VITALS — BP 122/70 | HR 92 | Temp 98.5°F | Resp 18 | Ht 60.75 in | Wt 165.0 lb

## 2012-09-10 DIAGNOSIS — M8580 Other specified disorders of bone density and structure, unspecified site: Secondary | ICD-10-CM | POA: Insufficient documentation

## 2012-09-10 DIAGNOSIS — J45909 Unspecified asthma, uncomplicated: Secondary | ICD-10-CM

## 2012-09-10 DIAGNOSIS — R599 Enlarged lymph nodes, unspecified: Secondary | ICD-10-CM

## 2012-09-10 DIAGNOSIS — I10 Essential (primary) hypertension: Secondary | ICD-10-CM

## 2012-09-10 DIAGNOSIS — R7309 Other abnormal glucose: Secondary | ICD-10-CM

## 2012-09-10 DIAGNOSIS — R739 Hyperglycemia, unspecified: Secondary | ICD-10-CM

## 2012-09-10 DIAGNOSIS — E785 Hyperlipidemia, unspecified: Secondary | ICD-10-CM

## 2012-09-10 DIAGNOSIS — R590 Localized enlarged lymph nodes: Secondary | ICD-10-CM

## 2012-09-10 DIAGNOSIS — M899 Disorder of bone, unspecified: Secondary | ICD-10-CM

## 2012-09-10 DIAGNOSIS — K219 Gastro-esophageal reflux disease without esophagitis: Secondary | ICD-10-CM

## 2012-09-10 DIAGNOSIS — M949 Disorder of cartilage, unspecified: Secondary | ICD-10-CM

## 2012-09-10 MED ORDER — ALENDRONATE SODIUM 70 MG PO TABS: 70.0000 mg | ORAL_TABLET | ORAL | Status: DC

## 2012-09-10 MED ORDER — METOPROLOL SUCCINATE ER 50 MG PO TB24: 50.0000 mg | ORAL_TABLET | Freq: Every day | ORAL | Status: DC

## 2012-09-10 MED ORDER — ALBUTEROL SULFATE HFA 108 (90 BASE) MCG/ACT IN AERS: 2.0000 | INHALATION_SPRAY | Freq: Four times a day (QID) | RESPIRATORY_TRACT | Status: DC | PRN

## 2012-09-10 MED ORDER — LOSARTAN POTASSIUM 50 MG PO TABS: 50.0000 mg | ORAL_TABLET | Freq: Every day | ORAL | Status: DC

## 2012-09-10 MED ORDER — RANITIDINE HCL 150 MG PO TABS: 150.0000 mg | ORAL_TABLET | Freq: Every day | ORAL | Status: DC

## 2012-09-10 MED ORDER — PRAVASTATIN SODIUM 20 MG PO TABS: 20.0000 mg | ORAL_TABLET | Freq: Every day | ORAL | Status: DC

## 2012-09-10 NOTE — Progress Notes (Signed)
Patient ID: Cathy [name] MRN: [id number], DOB: Apr 10, 1946, 67 y.o. Date of Encounter: 09/10/2012,   Chief Complaint: Physical (CPE)  HPI: 67 y.o. y/o female with history of noted below here for CPE.  Doing well. No issues/complaints.  Review of Systems: Consitutional: No fever, chills, fatigue, night sweats. No significant/unexplained weight changes. Eyes: No visual changes, eye redness, or discharge. ENT/Mouth: No ear pain, sore throat, nasal drainage, or sinus pain. Cardiovascular: No chest pressure,heaviness, tightness or squeezing, even with exertion. No increased shortness of breath or dyspnea on exertion.No palpitations, edema, orthopnea, PND. Respiratory: No cough, hemoptysis, SOB, or wheezing. Gastrointestinal: No anorexia, dysphagia, reflux, pain, nausea, vomiting, hematemesis, diarrhea, constipation, BRBPR, or melena. Breast: No mass, nodules, bulging, or retraction. No skin changes or inflammation. No nipple discharge. No lymphadenopathy. Genitourinary: No dysuria, hematuria, incontinence, vaginal discharge, pruritis, burning, abnormal bleeding, or pain. Musculoskeletal: No decreased ROM, No joint pain or swelling. No significant pain in neck, back, or extremities. Skin: No rash, pruritis, or concerning lesions. Neurological: No headache, dizziness, syncope, seizures, tremors, memory loss, coordination problems, or paresthesias. Psychological: No anxiety, depression, hallucinations, SI/HI. Endocrine: No polydipsia, polyphagia, polyuria, or known diabetes.No increased fatigue. No palpitations/rapid heart rate. No significant/unexplained weight change. All other systems were reviewed and are otherwise negative.  Past Medical History  Diagnosis Date  . Lymphadenopathy of left cervical region 08/01/2011  . Hilar lymphadenopathy 08/01/2011  . Post-menopause   . Abnormal glucose   . Vision changes   . Benign breast cyst in female   . 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     Grade 2   . Nephrolithiasis 1984  . Asthma     Allergy induced  . GERD (gastroesophageal reflux disease)   . Hyperlipidemia   . Hypertension   . Osteopenia      Past Surgical History  Procedure Laterality Date  . Cholecystectomy    . Lithotripsy  1984    Home Meds:  Current Outpatient Prescriptions on File Prior to Visit  Medication Sig Dispense Refill  . albuterol (PROVENTIL HFA;VENTOLIN HFA) 108 (90 BASE) MCG/ACT inhaler Inhale 2 puffs into the lungs every 6 (six) hours as needed.      Marland Kitchen aspirin 81 MG tablet Take 81 mg by mouth daily.      . calcium carbonate (OS-CAL) 600 MG TABS Take 600 mg by mouth daily.      Marland Kitchen glucosamine-chondroitin 500-400 MG tablet Take 1 tablet by mouth 2 (two) times daily.      Marland Kitchen HYDROcodone-acetaminophen (NORCO/VICODIN) 5-325 MG per tablet       . losartan (COZAAR) 50 MG tablet Take 1 tablet (50 mg total) by mouth daily.  30 tablet  11  . methocarbamol (ROBAXIN) 750 MG tablet       . metoprolol succinate (TOPROL-XL) 50 MG 24 hr tablet Take 50 mg by mouth daily. Take with or immediately following a meal.      . Multiple Vitamin (MULTIVITAMIN) capsule Take 1 capsule by mouth daily.      . naproxen (NAPROSYN) 500 MG tablet Take 500 mg by mouth 2 (two) times daily with a meal.      . ranitidine (ZANTAC) 150 MG tablet Take 150 mg by mouth daily.       No current facility-administered medications on file prior to visit.    Allergies:  Allergies  Allergen Reactions  . Codeine     Unable to recall     History   Social History  . Marital Status: Widowed  Spouse Name: N/A    Number of Children: N/A  . Years of Education: N/A   Occupational History  . Not on file.   Social History Main Topics  . Smoking status: Never Smoker   . Smokeless tobacco: Not on file  . Alcohol Use: No  . Drug Use: No  . Sexually Active: Not on file   Other Topics Concern  . Not on file   Social History Narrative  . No narrative on file    Family History  Problem  Relation Age of Onset  . Hypertension Mother   . Stroke Mother   . Heart attack Mother     CABG, 5 Stents  . Dementia Mother   . Cancer Father     Kidney  . Diabetes Brother     Borderline  . Alcohol abuse Brother   . Asthma Sister     Physical Exam: Blood pressure 122/70, pulse 92, temperature 98.5 F (36.9 C), temperature source Oral, resp. rate 18, height 5' 0.75" (1.543 m), weight 165 lb (74.844 kg)., Body mass index is 31.44 kg/(m^2). General: Well developed, well nourished, in no acute distress. HEENT: Normocephalic, atraumatic. Conjunctiva pink, sclera non-icteric. Pupils 2 mm constricting to 1 mm, round, regular, and equally reactive to light and accomodation. EOMI. Internal auditory canal clear. TMs with good cone of light and without pathology. Nasal mucosa pink. Nares are without discharge. No sinus tenderness. Oral mucosa pink.  Pharynx without exudate.   Neck: Supple. Trachea midline. No thyromegaly. Full ROM.No Carotid Bruits. Lungs: Clear to auscultation bilaterally without wheezes, rales, or rhonchi. Breathing is of normal effort and unlabored. Cardiovascular: RRR with S1 S2. No murmurs, rubs, or gallops. Distal pulses 2+ symmetrically. No carotid or abdominal bruits. Breast: Symmetrical. No masses. Nipples without discharge. Abdomen: Soft, non-tender, non-distended with normoactive bowel sounds. No hepatosplenomegaly or masses. No rebound/guarding. No CVA tenderness. No hernias.  Genitourinary:  External genitalia without lesions. Vaginal mucosa pink.No discharge present. Cervix pink and without discharge. No cervical tenderness.Normal uterus size. No adnexal mass or tenderness.   Musculoskeletal: Full range of motion and 5/5 strength throughout. Without swelling, atrophy, tenderness, crepitus, or warmth. Extremities without clubbing, cyanosis, or edema. Calves supple. Skin: Warm and moist without erythema, ecchymosis, wounds, or rash. Neuro: A+Ox3. CN II-XII grossly  intact. Moves all extremities spontaneously. Full sensation throughout. Normal gait. DTR 2+ throughout upper and lower extremities. Finger to nose intact. Psych:  Responds to questions appropriately with a normal affect.    Immunization History  Administered Date(s) Administered  . Influenza Split 03/07/2012  . Pneumococcal Polysaccharide 03/31/2010  . Td 01/03/2011  . Zoster 05/04/2007    Assessment/Plan:  67 y.o. y/o female here for CPE 1. Asthma Occasionally needs inhaler "when allergies act up." Old inhaler expired;needs refill. - albuterol (PROVENTIL HFA;VENTOLIN HFA) 108 (90 BASE) MCG/ACT inhaler; Inhale 2 puffs into the lungs every 6 (six) hours as needed for wheezing.  Dispense: 1 Inhaler; Refill: 3  2. GERD (gastroesophageal reflux disease) Controlled. Cont Zantac QD  3. Hyperlipidemia Pt quit Zocor secondary to knee pain-not real sure if pain only in joint or if in muscle. Pain is better with glucosamine/chondroitin.  Pt had labs already. LFTs Nml. LDL 111. Pt concerned with her family h/o CAD. Also, with hyperglycemia, and HTN, will restart chol tx. Will go ahead and change to pravastatin rather than re-challenging her with simvastatin. If she develops pain in muscles, let me know. Will start Pravastatin 20mg  then recheck FLP/LFT in 3mos. -  pravastatin (PRAVACHOL) 20 MG tablet; Take 1 tablet (20 mg total) by mouth daily.  Dispense: 90 tablet; Refill: 3 Order placed for Lab in 3 mos.  4. Hypertension At Goal. BMET Nml. Cont current meds.   5. Hilar lymphadenopathy Pt reports that Dr Cyndie Chime plans to recheck in 6 months. At that time he may recommend bone marrow biopsy.  6. Lymphadenopathy of left cervical region See # 5.  7. Osteopenia She was on Evista in past. D/Ced this 9/09 secondary to concern of blood clot.  9/09 changed to Fosamax. At OV 3/13 she reported that she had stopped Fosamax b/c she heard of risk of problem with jaw and femur. She is still off  fosamax but is agreeable to restart if necessary. We reviewed her last DEXA-  09/12/11:   Lumbar Spine -2.1 T score.     Hip T-score: -1.9 Discussed risks/benefits of therapy. Discussed that studies have shown that if fosamax is continued more than 5 years, the risk of atypical fracture of the femur increases but that patients do not get additional benefit of continuing treatment greater than 5 years. She agrees to restart fosamax now but will plan to stop it around 02/2013. (It was started 9/09 but she has been off the med for past one year).  - alendronate (FOSAMAX) 70 MG tablet; Take 1 tablet (70 mg total) by mouth every 7 (seven) days. Take with a full glass of water on an empty stomach.  Dispense: 4 tablet; Refill: 11  8. Hyperglycemia: Discussed lab result with patient at length today. Discussed FBS and A1C. Gave and reviewed carbohydarate handout. Reviewed with her at length. Discussed specific things to avoid but also discussed good meal options. She is to be stricter with this then recheck A1C in 3 months when has FLP/LFT. Order placed. Her walking/activity has been decreased secondary to knee pain and achiness in back since fall (see note 06/25/12).  9.Screening Labs: Other screening labs normal. Cont current dose Vit D.  10. Immunizations: See Above-UTD  11. Colonoscopy: 10/10/11- Diverticulosis. Report states to repeat in 5 years.  12. Pap: Normal 08/31/11. Wait 3-5 years to repeat pap. Pelvic exam normal today.  13. Mammogram: 05/15/12 negative.  Repeat FLP/LFT/A1C in 3 months.      ROV in 6 months.  [address] [place], [place], [facility] 09/10/2012 11:43 AM

## 2013-01-18 ENCOUNTER — Telehealth: Payer: Self-pay | Admitting: *Deleted

## 2013-01-18 NOTE — Telephone Encounter (Signed)
Patient is scheduled for CT scan of soft tissue neck and chest with contrast.  [facility] does not have orders.  [facility] orders for scans faxed to [phone].

## 2013-01-21 ENCOUNTER — Other Ambulatory Visit (HOSPITAL_BASED_OUTPATIENT_CLINIC_OR_DEPARTMENT_OTHER): Payer: Medicare Other | Admitting: Lab

## 2013-01-21 DIAGNOSIS — R590 Localized enlarged lymph nodes: Secondary | ICD-10-CM

## 2013-01-21 DIAGNOSIS — R599 Enlarged lymph nodes, unspecified: Secondary | ICD-10-CM

## 2013-01-21 LAB — CBC WITH DIFFERENTIAL/PLATELET
Basophils Absolute: 0 10*3/uL (ref 0.0–0.1)
Eosinophils Absolute: 0.3 10*3/uL (ref 0.0–0.5)
HCT: 43.2 % (ref 34.8–46.6)
HGB: 14.5 g/dL (ref 11.6–15.9)
LYMPH%: 19.8 % (ref 14.0–49.7)
MONO#: 0.9 10*3/uL (ref 0.1–0.9)
NEUT#: 5.3 10*3/uL (ref 1.5–6.5)
Platelets: 203 10*3/uL (ref 145–400)
RBC: 4.9 10*6/uL (ref 3.70–5.45)
WBC: 8.1 10*3/uL (ref 3.9–10.3)

## 2013-01-21 LAB — SEDIMENTATION RATE: Sed Rate: 4 mm/hr (ref 0–22)

## 2013-01-25 ENCOUNTER — Telehealth: Payer: Self-pay | Admitting: Oncology

## 2013-01-25 ENCOUNTER — Ambulatory Visit (HOSPITAL_BASED_OUTPATIENT_CLINIC_OR_DEPARTMENT_OTHER): Payer: Medicare Other | Admitting: Oncology

## 2013-01-25 VITALS — BP 145/82 | HR 89 | Temp 97.6°F | Resp 18 | Ht 60.75 in | Wt 169.4 lb

## 2013-01-25 DIAGNOSIS — R590 Localized enlarged lymph nodes: Secondary | ICD-10-CM

## 2013-01-25 DIAGNOSIS — R599 Enlarged lymph nodes, unspecified: Secondary | ICD-10-CM

## 2013-01-25 NOTE — Progress Notes (Signed)
Hematology and Oncology Follow Up Visit  Cathy [name] [id number] 03-Jan-1946 67 y.o. 01/25/2013 4:27 PM   Principle Diagnosis: Encounter Diagnoses  Name Primary?  . Lymphadenopathy of left cervical region Yes  . Hilar lymphadenopathy      Interim History:  Follow up visit for this delightful 67 year old woman with idiopathic cervical and mediastinal lymphadenopathy initially presenting with asymptomatic left supraclavicular lymph gland enlargement in October 2011. She had an incisional biopsy on October 25 which showed atypical lymphoid hyperplasia. Biopsies were sent for second opinion. This appeared to be a T-cell process with increased CD4 positive T lymphocytes but a firm diagnosis of lymphoma could not be established. CT scans of the neck chest abdomen and pelvis showed adenopathy in the left supraclavicular and left axillary regions and a single large right paratracheal lymph node 2 cm in diameter and 5-6 cm in length. No abdominal or pelvic adenopathy and no splenomegaly.  She had no constitutional symptoms. Lab testing showed previous exposure to EBV and CMV viruses with elevated IgG but not IgM titers. There was also a significant elevation of IgG against toxoplasmosis. She had no history of exposure to cats or cat litter. She was not anemic. She had a normal white blood count and differential. Serum LDH normal. ESR 4 mm.  I elected to follow her with close observation alone and serial CT scans. She has remained asymptomatic. She has had no interim medical problems. She denies any dyspnea, dysphagia, no constitutional symptoms. She gets intermittent supraclavicular soft tissue swelling in the area of previous lymph node biopsy.   Medications: reviewed  Allergies:  Allergies  Allergen Reactions  . Codeine     Unable to recall     Review of Systems: Constitutional:   See above Respiratory: See above Cardiovascular:  No chest pain or palpitations Gastrointestinal: No change in  bowel habit Genito-Urinary: Not questioned Musculoskeletal: No muscle bone or joint pain Neurologic: No headache Skin: No rash Remaining ROS negative.  Physical Exam: Blood pressure 145/82, pulse 89, temperature 97.6 F (36.4 C), temperature source Oral, resp. rate 18, height 5' 0.75" (1.543 m), weight 169 lb 6.4 oz (76.839 kg). Wt Readings from Last 3 Encounters:  01/25/13 169 lb 6.4 oz (76.839 kg)  09/10/12 165 lb (74.844 kg)  06/25/12 166 lb (75.297 kg)     General appearance: Well-nourished Caucasian woman HENNT: Pharynx no erythema exudate or mass Lymph nodes: Soft tissue swelling left supraclavicular area without definite palpable adenopathy. No cervical, axillary, adenopathy Breasts: Lungs: Clear to auscultation resonant to percussion Heart: Regular rhythm no murmur Abdomen: Soft, nontender, no mass, no organomegaly Extremities: No edema, no calf tenderness Musculoskeletal: No joint deformities GU: Vascular: No cyanosis Neurologic: Motor strength 5 over 5 Skin: No rash or ecchymosis  Lab Results: Lab Results  Component Value Date   WBC 8.1 01/21/2013   HGB 14.5 01/21/2013   HCT 43.2 01/21/2013   MCV 88.1 01/21/2013   PLT 203 01/21/2013     Chemistry      Component Value Date/Time   NA 140 09/03/2012 0929   NA 139 07/16/2012 1304   K 5.1 09/03/2012 0929   K 4.3 07/16/2012 1304   CL 99 09/03/2012 0929   CL 103 07/16/2012 1304   CO2 28 09/03/2012 0929   CO2 25 07/16/2012 1304   BUN 12 09/03/2012 0929   BUN 14.4 07/16/2012 1304   CREATININE 0.73 09/03/2012 0929   CREATININE 0.8 07/16/2012 1304   CREATININE 0.77 07/27/2011 1340  Component Value Date/Time   CALCIUM 10.1 09/03/2012 0929   CALCIUM 9.6 07/16/2012 1304   ALKPHOS 92 09/03/2012 0929   AST 22 09/03/2012 0929   ALT 18 09/03/2012 0929   BILITOT 0.6 09/03/2012 0929    ESR 4 mm White count differential: 66 neutrophils, 20 lymphocytes, 11 monocytes, 4 eosinophils on 01/21/2013   Radiological Studies: CT scans of the  neck and chest which I personally reviewed the done at [facility] shows no change in the size or distribution of cervical, mediastinal, axillary, and left base of neck lymphadenopathy. Some of the nodes appear more prominent compared with the study done in February 2013. I went back and looked at a study done at diagnosis in October of 2011. It is my impression that all of the lymph nodes are less prominent. Dominant right paratracheal node measured in transverse dimension 2.4 cm on 03/29/2010 with current measuring 1.5 cm.  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  The patient remains asymptomatic. Plan: I will continue observation alone at this time. She understands that at some point we may need to repeat a biopsy. I'll see her again in 6 months with lab and scan a few days prior to the visit.   CC:.    Levert Feinstein, MD 8/15/20144:27 PM

## 2013-01-25 NOTE — Telephone Encounter (Signed)
Gave pt appt for lab and MD , gave Lanora Manis, to precert , order for CT to be scheduled @ [facility] on February 2015

## 2013-01-29 ENCOUNTER — Other Ambulatory Visit: Payer: Self-pay | Admitting: Oncology

## 2013-01-29 ENCOUNTER — Telehealth: Payer: Self-pay | Admitting: Oncology

## 2013-01-29 NOTE — Telephone Encounter (Signed)
Called pt and left message regarding lab and MD on February 2015

## 2013-02-15 ENCOUNTER — Other Ambulatory Visit (INDEPENDENT_AMBULATORY_CARE_PROVIDER_SITE_OTHER): Payer: Medicare Other

## 2013-02-15 DIAGNOSIS — R739 Hyperglycemia, unspecified: Secondary | ICD-10-CM

## 2013-02-15 DIAGNOSIS — I10 Essential (primary) hypertension: Secondary | ICD-10-CM

## 2013-02-15 DIAGNOSIS — Z23 Encounter for immunization: Secondary | ICD-10-CM

## 2013-02-15 DIAGNOSIS — E785 Hyperlipidemia, unspecified: Secondary | ICD-10-CM

## 2013-02-15 LAB — LIPID PANEL
HDL: 38 mg/dL — ABNORMAL LOW (ref >39)
LDL Cholesterol: 79 mg/dL (ref 0–99)

## 2013-02-15 LAB — COMPLETE METABOLIC PANEL WITH GFR
ALT: 28 U/L (ref 0–35)
CO2: 29 mEq/L (ref 19–32)
Chloride: 102 mEq/L (ref 96–112)
GFR, Est African American: >89 mL/min
Sodium: 138 mEq/L (ref 135–145)
Total Bilirubin: 0.7 mg/dL (ref 0.3–1.2)
Total Protein: 7.3 g/dL (ref 6.0–8.3)

## 2013-03-07 ENCOUNTER — Encounter: Payer: Self-pay | Admitting: Oncology

## 2013-03-11 ENCOUNTER — Telehealth: Payer: Self-pay | Admitting: Oncology

## 2013-03-11 NOTE — Telephone Encounter (Signed)
Talked to pt gave her appt fot CT on 07/23/12 @ [facility] lab here @ 0800

## 2013-04-05 ENCOUNTER — Other Ambulatory Visit: Payer: Self-pay

## 2013-04-05 DIAGNOSIS — Z1231 Encounter for screening mammogram for malignant neoplasm of breast: Secondary | ICD-10-CM

## 2013-05-20 ENCOUNTER — Ambulatory Visit
Admission: RE | Admit: 2013-05-20 | Discharge: 2013-05-20 | Disposition: A | Discharge status: Home / Self Care | Payer: Medicare Other | Source: Ambulatory Visit

## 2013-05-20 DIAGNOSIS — Z1231 Encounter for screening mammogram for malignant neoplasm of breast: Secondary | ICD-10-CM

## 2013-05-28 ENCOUNTER — Ambulatory Visit (INDEPENDENT_AMBULATORY_CARE_PROVIDER_SITE_OTHER): Payer: Medicare Other | Admitting: Family Medicine

## 2013-05-28 DIAGNOSIS — Z23 Encounter for immunization: Secondary | ICD-10-CM

## 2013-07-19 ENCOUNTER — Telehealth: Payer: Self-pay | Admitting: Family Medicine

## 2013-07-19 DIAGNOSIS — R590 Localized enlarged lymph nodes: Secondary | ICD-10-CM

## 2013-07-19 DIAGNOSIS — E785 Hyperlipidemia, unspecified: Secondary | ICD-10-CM

## 2013-07-19 DIAGNOSIS — I10 Essential (primary) hypertension: Secondary | ICD-10-CM

## 2013-07-19 MED ORDER — RANITIDINE HCL 150 MG PO TABS: 150.0000 mg | ORAL_TABLET | Freq: Every day | ORAL | Status: DC

## 2013-07-19 MED ORDER — LOSARTAN POTASSIUM 50 MG PO TABS: 50.0000 mg | ORAL_TABLET | Freq: Every day | ORAL | Status: DC

## 2013-07-19 MED ORDER — PRAVASTATIN SODIUM 20 MG PO TABS: 20.0000 mg | ORAL_TABLET | Freq: Every day | ORAL | Status: DC

## 2013-07-19 NOTE — Telephone Encounter (Signed)
Medication refilled per protocol. 

## 2013-07-23 ENCOUNTER — Telehealth: Payer: Self-pay | Admitting: *Deleted

## 2013-07-23 ENCOUNTER — Other Ambulatory Visit (HOSPITAL_BASED_OUTPATIENT_CLINIC_OR_DEPARTMENT_OTHER): Payer: Medicare Other

## 2013-07-23 ENCOUNTER — Other Ambulatory Visit: Payer: Self-pay | Admitting: *Deleted

## 2013-07-23 DIAGNOSIS — R599 Enlarged lymph nodes, unspecified: Secondary | ICD-10-CM

## 2013-07-23 DIAGNOSIS — R590 Localized enlarged lymph nodes: Secondary | ICD-10-CM

## 2013-07-23 LAB — COMPREHENSIVE METABOLIC PANEL (CC13)
ALBUMIN: 4.5 g/dL (ref 3.5–5.0)
ALK PHOS: 76 U/L (ref 40–150)
ALT: 29 U/L (ref 0–55)
AST: 30 U/L (ref 5–34)
Anion Gap: 9 mEq/L (ref 3–11)
BUN: 13.1 mg/dL (ref 7.0–26.0)
CHLORIDE: 104 meq/L (ref 98–109)
CO2: 28 mEq/L (ref 22–29)
Calcium: 10.3 mg/dL (ref 8.4–10.4)
Creatinine: 0.8 mg/dL (ref 0.6–1.1)
Glucose: 118 mg/dl (ref 70–140)
POTASSIUM: 4.3 meq/L (ref 3.5–5.1)
Sodium: 141 mEq/L (ref 136–145)
TOTAL PROTEIN: 7.6 g/dL (ref 6.4–8.3)
Total Bilirubin: 0.83 mg/dL (ref 0.20–1.20)

## 2013-07-23 LAB — CBC WITH DIFFERENTIAL/PLATELET
BASO%: 0.3 % (ref 0.0–2.0)
Basophils Absolute: 0 10*3/uL (ref 0.0–0.1)
EOS ABS: 0.3 10*3/uL (ref 0.0–0.5)
EOS%: 3.4 % (ref 0.0–7.0)
HCT: 43.7 % (ref 34.8–46.6)
HEMOGLOBIN: 14.3 g/dL (ref 11.6–15.9)
LYMPH%: 15.9 % (ref 14.0–49.7)
MCH: 29.1 pg (ref 25.1–34.0)
MCHC: 32.7 g/dL (ref 31.5–36.0)
MCV: 88.8 fL (ref 79.5–101.0)
MONO#: 0.9 10*3/uL (ref 0.1–0.9)
MONO%: 10.8 % (ref 0.0–14.0)
NEUT%: 69.6 % (ref 38.4–76.8)
NEUTROS ABS: 5.5 10*3/uL (ref 1.5–6.5)
Platelets: 234 10*3/uL (ref 145–400)
RBC: 4.92 10*6/uL (ref 3.70–5.45)
RDW: 13.4 % (ref 11.2–14.5)
WBC: 7.9 10*3/uL (ref 3.9–10.3)
lymph#: 1.3 10*3/uL (ref 0.9–3.3)

## 2013-07-23 LAB — MORPHOLOGY: PLT EST: ADEQUATE

## 2013-07-23 LAB — SEDIMENTATION RATE: Sed Rate: 6 mm/hr (ref 0–22)

## 2013-07-23 LAB — LACTATE DEHYDROGENASE (CC13): LDH: 208 U/L (ref 125–245)

## 2013-07-23 LAB — CHCC SMEAR

## 2013-07-23 NOTE — Telephone Encounter (Signed)
Received call from [place] stating that CT's need to be changed to with Contrast.  Notified [facility] to precert change & new order entered.  [employer] approved change & Cathy [name] was notified to contact [place].

## 2013-07-26 ENCOUNTER — Telehealth: Payer: Self-pay | Admitting: Oncology

## 2013-07-26 ENCOUNTER — Ambulatory Visit (HOSPITAL_BASED_OUTPATIENT_CLINIC_OR_DEPARTMENT_OTHER): Payer: Medicare Other | Admitting: Oncology

## 2013-07-26 VITALS — BP 166/82 | HR 88 | Temp 99.2°F | Resp 18 | Ht 60.0 in | Wt 172.0 lb

## 2013-07-26 DIAGNOSIS — R599 Enlarged lymph nodes, unspecified: Secondary | ICD-10-CM

## 2013-07-26 DIAGNOSIS — R590 Localized enlarged lymph nodes: Secondary | ICD-10-CM

## 2013-07-26 NOTE — Progress Notes (Signed)
Hematology and Oncology Follow Up Visit  Cathy [name] [id number] Jul 06, 1945 68 y.o. 07/26/2013 2:59 PM   Principle Diagnosis: Encounter Diagnoses  Name Primary?  . Lymphadenopathy of left cervical region Yes  . 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 She had an incisional biopsy on October 25 which showed atypical lymphoid hyperplasia. Biopsies were sent for second opinion. This appeared to be a T-cell process with increased CD4 positive T lymphocytes but a firm diagnosis of lymphoma could not be established. CT scans of the neck chest abdomen and pelvis showed adenopathy in the left supraclavicular and left axillary regions and a single large right paratracheal lymph node 2 cm in diameter and 5-6 cm in length. No abdominal or pelvic adenopathy and no splenomegaly.  She had no constitutional symptoms. Lab testing showed previous exposure to EBV and CMV viruses with elevated IgG but not IgM titers. There was also a significant elevation of IgG against toxoplasmosis. She had no history of exposure to cats or cat litter. She was not anemic. She had a normal white blood count and differential. Serum LDH normal. ESR 4 mm.  I elected to follow her with close observation alone and serial CT scans. She has remained asymptomatic.   She has had no interim medical problems. She denies any dyspnea, dysphagia, no constitutional symptoms.  She gets intermittent supraclavicular soft tissue swelling in the area of previous lymph node biopsy.  CT scans of the neck and chest and upper abdomen done at [facility] in anticipation of today's visit February 5. I personally reviewed these images with one of our senior radiologists Dr. Lin Landsman. Cuts through the neck are of poor quality due to artifact  from dental work. What is reported as "interval heterogeneous enlargement of the left oropharyngeal soft tissues with compression and rightward deviation of the trachea" is not confirmed by our radiologist. There are some minor changes in some of the lymph glands he.mec size of left pre-clavicular and left axillary nodes are slightly increased in size compared with 07/16/2012 but appear generally stable compared with 01/21/2013." There is persistent but unchanged adenopathy within the left neck base and right paratracheal region similar to prior exams. Subcentimeter mediastinal lymph nodes not pathologically enlarged. Upper abdomen appears normal to me with normal-size liver and spleen.    Medications: reviewed  Allergies:  Allergies  Allergen Reactions  . Codeine     Unable to recall     Review of Systems: Hematology:  No bleeding or bruising ENT ROS: No sore throat Breast ROS:  Respiratory ROS: No cough or dyspnea  Cardiovascular ROS:  No chest pain or palpitations Gastrointestinal ROS: No abdominal pain   Genito-Urinary ROS: Not questioned  Musculoskeletal ROS: No bone pain Neurological ROS: No headache or change in vision Dermatological ROS: No rash or ecchymosis Remaining ROS negative:   Physical Exam: Blood pressure 166/82, pulse 88, temperature 99.2 F (37.3 C), temperature source Oral, resp. rate 18, height 5' (1.524 m), weight 172 lb (78.019 kg), SpO2 98.00%. Wt Readings from Last 3 Encounters:  07/26/13 172 lb (78.019 kg)  01/25/13 169 lb 6.4 oz (76.839 kg)  09/10/12 165 lb (74.844 kg)     General appearance: Well-nourished Caucasian woman HENNT: Pharynx no erythema, exudate, mass, or ulcer. No thyromegaly or thyroid nodules Lymph nodes: No cervical, supraclavicular,  axillary, or inguinal lymphadenopathy  Breasts: Lungs: Clear to auscultation, resonant to percussion throughout Heart: Regular rhythm, no murmur, no gallop, no rub, no click, no edema Abdomen: Soft,  nontender, normal bowel sounds, no mass, no organomegaly Extremities: No edema, no calf tenderness Musculoskeletal: no joint deformities GU:  Vascular: Carotid pulses 2+, no bruits,  Neurologic: Alert, oriented, PERRLA,  , cranial nerves grossly normal, motor strength 5 over 5, reflexes 1+ symmetric, upper body coordination normal, gait normal, Skin: No rash or ecchymosis  Lab Results: CBC W/Diff    Component Value Date/Time   WBC 7.9 07/23/2013 0802   WBC 8.1 09/03/2012 0929   RBC 4.92 07/23/2013 0802   RBC 5.18* 09/03/2012 0929   HGB 14.3 07/23/2013 0802   HGB 15.1* 09/03/2012 0929   HCT 43.7 07/23/2013 0802   HCT 44.2 09/03/2012 0929   PLT 234 07/23/2013 0802   PLT 302 09/03/2012 0929   MCV 88.8 07/23/2013 0802   MCV 85.3 09/03/2012 0929   MCH 29.1 07/23/2013 0802   MCH 29.2 09/03/2012 0929   MCHC 32.7 07/23/2013 0802   MCHC 34.2 09/03/2012 0929   RDW 13.4 07/23/2013 0802   RDW 13.9 09/03/2012 0929   LYMPHSABS 1.3 07/23/2013 0802   LYMPHSABS 1.5 09/03/2012 0929   MONOABS 0.9 07/23/2013 0802   MONOABS 0.7 09/03/2012 0929   EOSABS 0.3 07/23/2013 0802   EOSABS 0.4 09/03/2012 0929   BASOSABS 0.0 07/23/2013 0802   BASOSABS 0.0 09/03/2012 0929     Chemistry      Component Value Date/Time   NA 141 07/23/2013 0802   NA 138 02/15/2013 0807   K 4.3 07/23/2013 0802   K 4.6 02/15/2013 0807   CL 102 02/15/2013 0807   CL 103 07/16/2012 1304   CO2 28 07/23/2013 0802   CO2 29 02/15/2013 0807   BUN 13.1 07/23/2013 0802   BUN 11 02/15/2013 0807   CREATININE 0.8 07/23/2013 0802   CREATININE 0.74 02/15/2013 0807   CREATININE 0.77 07/27/2011 1340      Component Value Date/Time   CALCIUM 10.3 07/23/2013 0802   CALCIUM 9.5 02/15/2013 0807   ALKPHOS 76 07/23/2013 0802   ALKPHOS 67 02/15/2013 0807   AST 30 07/23/2013 0802   AST 31 02/15/2013 0807   ALT 29 07/23/2013 0802   ALT 28 02/15/2013 0807   BILITOT 0.83 07/23/2013 0802   BILITOT 0.7 02/15/2013 9735       Radiological Studies: See discussion above   Impression:   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  She remains asymptomatic.   Plan: I will continue observation alone at this time. We discussed again that at some point we may need to repeat a biopsy but I do not see any indications for this at the present time.   I will transition her care to Dr. Julieanne Manson.    CC: Patient Care Team: Susy Frizzle, MD as PCP - General (Family Medicine)   Annia Belt, MD 2/13/20152:59 PM

## 2013-07-26 NOTE — Telephone Encounter (Signed)
Gave pt appt for lab and MD for August a former Dr.[email or web address]

## 2013-08-10 ENCOUNTER — Encounter: Payer: Self-pay | Admitting: Oncology

## 2013-08-15 ENCOUNTER — Encounter: Payer: Self-pay | Admitting: Oncology

## 2013-09-10 ENCOUNTER — Other Ambulatory Visit: Payer: Medicare Other

## 2013-09-10 ENCOUNTER — Other Ambulatory Visit: Payer: Self-pay | Admitting: *Deleted

## 2013-09-10 DIAGNOSIS — Z0000 Encounter for general adult medical examination without abnormal findings: Secondary | ICD-10-CM

## 2013-09-10 DIAGNOSIS — E785 Hyperlipidemia, unspecified: Secondary | ICD-10-CM

## 2013-09-10 DIAGNOSIS — I10 Essential (primary) hypertension: Secondary | ICD-10-CM

## 2013-09-10 DIAGNOSIS — E559 Vitamin D deficiency, unspecified: Secondary | ICD-10-CM

## 2013-09-10 LAB — COMPLETE METABOLIC PANEL WITH GFR
ALBUMIN: 4.6 g/dL (ref 3.5–5.2)
ALT: 30 U/L (ref 0–35)
AST: 32 U/L (ref 0–37)
Alkaline Phosphatase: 70 U/L (ref 39–117)
BUN: 14 mg/dL (ref 6–23)
CALCIUM: 9.7 mg/dL (ref 8.4–10.5)
CHLORIDE: 101 meq/L (ref 96–112)
CO2: 27 mEq/L (ref 19–32)
Creat: 0.71 mg/dL (ref 0.50–1.10)
GFR, Est African American: >89 mL/min
GFR, Est Non African American: 89 mL/min
Glucose, Bld: 112 mg/dL — ABNORMAL HIGH (ref 70–99)
Potassium: 4.6 mEq/L (ref 3.5–5.3)
Sodium: 140 mEq/L (ref 135–145)
Total Bilirubin: 0.7 mg/dL (ref 0.2–1.2)
Total Protein: 7.3 g/dL (ref 6.0–8.3)

## 2013-09-10 LAB — HEMOGLOBIN A1C
Hgb A1c MFr Bld: 6.1 % — ABNORMAL HIGH (ref <5.7)
Mean Plasma Glucose: 128 mg/dL — ABNORMAL HIGH (ref <117)

## 2013-09-10 LAB — CBC WITH DIFFERENTIAL/PLATELET
Basophils Absolute: 0 10*3/uL (ref 0.0–0.1)
Basophils Relative: 0 % (ref 0–1)
Eosinophils Absolute: 0.3 10*3/uL (ref 0.0–0.7)
Eosinophils Relative: 4 % (ref 0–5)
HCT: 41.9 % (ref 36.0–46.0)
Hemoglobin: 14.5 g/dL (ref 12.0–15.0)
Lymphocytes Relative: 22 % (ref 12–46)
Lymphs Abs: 1.7 10*3/uL (ref 0.7–4.0)
MCH: 29.2 pg (ref 26.0–34.0)
MCHC: 34.6 g/dL (ref 30.0–36.0)
MCV: 84.5 fL (ref 78.0–100.0)
Monocytes Absolute: 0.8 10*3/uL (ref 0.1–1.0)
Monocytes Relative: 11 % (ref 3–12)
NEUTROS ABS: 4.9 10*3/uL (ref 1.7–7.7)
Neutrophils Relative %: 63 % (ref 43–77)
PLATELETS: 221 10*3/uL (ref 150–400)
RBC: 4.96 MIL/uL (ref 3.87–5.11)
RDW: 14.7 % (ref 11.5–15.5)
WBC: 7.7 10*3/uL (ref 4.0–10.5)

## 2013-09-10 LAB — LIPID PANEL
CHOL/HDL RATIO: 3.8 ratio
Cholesterol: 150 mg/dL (ref 0–200)
HDL: 40 mg/dL (ref >39)
LDL Cholesterol: 80 mg/dL (ref 0–99)
Triglycerides: 151 mg/dL — ABNORMAL HIGH (ref <150)
VLDL: 30 mg/dL (ref 0–40)

## 2013-09-10 LAB — VITAMIN D 25 HYDROXY (VIT D DEFICIENCY, FRACTURES): Vit D, 25-Hydroxy: 54 ng/mL (ref 30–89)

## 2013-09-17 ENCOUNTER — Ambulatory Visit (INDEPENDENT_AMBULATORY_CARE_PROVIDER_SITE_OTHER): Payer: Medicare Other | Admitting: Family Medicine

## 2013-09-17 ENCOUNTER — Encounter: Payer: Self-pay | Admitting: Family Medicine

## 2013-09-17 VITALS — BP 138/82 | HR 76 | Temp 98.5°F | Resp 18 | Ht 61.5 in | Wt 173.0 lb

## 2013-09-17 DIAGNOSIS — R7303 Prediabetes: Secondary | ICD-10-CM

## 2013-09-17 DIAGNOSIS — R599 Enlarged lymph nodes, unspecified: Secondary | ICD-10-CM

## 2013-09-17 DIAGNOSIS — R590 Localized enlarged lymph nodes: Secondary | ICD-10-CM

## 2013-09-17 DIAGNOSIS — I10 Essential (primary) hypertension: Secondary | ICD-10-CM

## 2013-09-17 DIAGNOSIS — M1710 Unilateral primary osteoarthritis, unspecified knee: Secondary | ICD-10-CM

## 2013-09-17 DIAGNOSIS — E785 Hyperlipidemia, unspecified: Secondary | ICD-10-CM

## 2013-09-17 DIAGNOSIS — G629 Polyneuropathy, unspecified: Secondary | ICD-10-CM | POA: Insufficient documentation

## 2013-09-17 DIAGNOSIS — Z0000 Encounter for general adult medical examination without abnormal findings: Secondary | ICD-10-CM

## 2013-09-17 DIAGNOSIS — M899 Disorder of bone, unspecified: Secondary | ICD-10-CM

## 2013-09-17 DIAGNOSIS — M8580 Other specified disorders of bone density and structure, unspecified site: Secondary | ICD-10-CM

## 2013-09-17 DIAGNOSIS — IMO0002 Reserved for concepts with insufficient information to code with codable children: Secondary | ICD-10-CM

## 2013-09-17 DIAGNOSIS — R202 Paresthesia of skin: Secondary | ICD-10-CM

## 2013-09-17 DIAGNOSIS — M949 Disorder of cartilage, unspecified: Secondary | ICD-10-CM

## 2013-09-17 DIAGNOSIS — J45909 Unspecified asthma, uncomplicated: Secondary | ICD-10-CM

## 2013-09-17 DIAGNOSIS — R209 Unspecified disturbances of skin sensation: Secondary | ICD-10-CM

## 2013-09-17 DIAGNOSIS — M179 Osteoarthritis of knee, unspecified: Secondary | ICD-10-CM | POA: Insufficient documentation

## 2013-09-17 DIAGNOSIS — R7309 Other abnormal glucose: Secondary | ICD-10-CM

## 2013-09-17 MED ORDER — PRAVASTATIN SODIUM 20 MG PO TABS: 20.0000 mg | ORAL_TABLET | Freq: Every day | ORAL | Status: DC

## 2013-09-17 MED ORDER — RANITIDINE HCL 150 MG PO TABS: 150.0000 mg | ORAL_TABLET | Freq: Every day | ORAL | Status: DC

## 2013-09-17 MED ORDER — METOPROLOL SUCCINATE ER 50 MG PO TB24: 50.0000 mg | ORAL_TABLET | Freq: Every day | ORAL | Status: DC

## 2013-09-17 MED ORDER — LOSARTAN POTASSIUM 50 MG PO TABS: 50.0000 mg | ORAL_TABLET | Freq: Every day | ORAL | Status: DC

## 2013-09-17 NOTE — Assessment & Plan Note (Signed)
Advised her to try Claritin or Zyrtec over-the-counter for her allergies.

## 2013-09-17 NOTE — Assessment & Plan Note (Signed)
Bone density to be done she is on calcium and vitamin D

## 2013-09-17 NOTE — Assessment & Plan Note (Signed)
A1c looks good she has gained some weight recently so we have discussed low-fat low sugar diet.

## 2013-09-17 NOTE — Patient Instructions (Signed)
Try the zyrtec or claritin for allergies Try the Aleve for the joints- or Aspercreme Bone Density to be set up  Work on your diet - decrease the white foods  Schedule with Eye doctor F/U 4 months

## 2013-09-17 NOTE — Progress Notes (Signed)
Patient ID: Cathy [name], female   DOB: 1945/11/24, 68 y.o.   MRN: [id number] Subjective:   Patient presents for Medicare Annual/Subsequent preventive examination.   Patient here for annual exam. Her medications and history were reviewed her fasting labs were reviewed her immunizations are up to date per  History of prediabetes her A1c looks good. She's been trying to monitor her diet. She does have some mild paresthesias in her feet from time to time but the symptoms are not consistent. She does also complain of knee pain she has a history of osteoporosis of the knee she typically takes glucosamine. She has had cortisone injections in her knees before which has helped. She feels more stiff over the past few months especially when she gets up first thing in the morning when she gets moving around it improves. She occasionally takes an over-the-counter ibuprofen for pain which does help.  She still being followed by oncology for her lymphadenopathy everything has been stable.   Allergy-induced asthma she inquired about antihistamines as she to use for her allergies. Typically the pollen is what induces her asthma.  Osteopenia she is due for repeat bone density she is currently on calcium and vitamin D       Review Past Medical/Family/Social:-PER EMR   Risk Factors  Current exercise habits: Walks Dietary issues discussed: YES  Cardiac risk factors: Obesity (BMI >= 30 kg/m2).   Depression Screen  (Note: if answer to either of the following is "Yes", a more complete depression screening is indicated)  Over the past two weeks, have you felt down, depressed or hopeless? No Over the past two weeks, have you felt little interest or pleasure in doing things? No Have you lost interest or pleasure in daily life? No Do you often feel hopeless? No Do you cry easily over simple problems? No   Activities of Daily Living  In your present state of health, do you have any difficulty performing the  following activities?:  Driving? No  Managing money? No  Feeding yourself? No  Getting from bed to chair? No  Climbing a flight of stairs? No  Preparing food and eating?: No  Bathing or showering? No  Getting dressed: No  Getting to the toilet? No  Using the toilet:No  Moving around from place to place: No  In the past year have you fallen or had a near fall?:No  Are you sexually active? No  Do you have more than one partner? No   Hearing Difficulties: No  Do you often ask people to speak up or repeat themselves? No  Do you experience ringing or noises in your ears? No Do you have difficulty understanding soft or whispered voices? No  Do you feel that you have a problem with memory? No Do you often misplace items? No  Do you feel safe at home? Yes  Cognitive Testing  Alert? Yes Normal Appearance?Yes  Oriented to person? Yes Place? Yes  Time? Yes  Recall of three objects? Yes  Can perform simple calculations? Yes  Displays appropriate judgment?Yes  Can read the correct time from a watch face?Yes   List the Names of Other Physician/Practitioners you currently use: Oncology   Screening Tests / Date - Below ALL UP TO DATE Colonoscopy                     Zostavax  Mammogram  Influenza Vaccine  Tetanus/tdap  ROS:  GEN- denies fatigue, fever, weight loss,weakness, recent illness HEENT- denies eye  drainage, change in vision, nasal discharge, CVS- denies chest pain, palpitations RESP- denies SOB, cough, wheeze ABD- denies N/V, change in stools, abd pain GU- denies dysuria, hematuria, dribbling, incontinence MSK- denies joint pain, muscle aches, injury Neuro- denies headache, dizziness, syncope, seizure activity   PHYSICAL: GEN- NAD, alert and oriented x3 HEENT- PERRL, EOMI, non injected sclera, pink conjunctiva, MMM, oropharynx clear Neck- Supple, no thyromegaly, no LAD CVS- RRR, no murmur RESP-CTAB ABD-NABS,soft,NT,ND MSK- Right knee normal inspection, ligaments  in tact, fair ROM, no crepitus  - Left knee mild swelling inferior patella, no large effusion, no warmth, no erythema, ligaments in tact, fair ROM, no crepitus EXT- No edema Pulses- Radial, DP- 2+ Skin- in tact Neuro- normal monofilament bilat feet   Assessment:    Annual wellness medicare exam   Plan:    During the course of the visit the patient was educated and counseled about appropriate screening and preventive services including:   Screen Neg  for depression.  Diet review for nutrition referral? Yes ____ Not Indicated __x__  Patient Instructions (the written plan) was given to the patient.  Medicare Attestation  I have personally reviewed:  The patient's medical and social history  Their use of alcohol, tobacco or illicit drugs  Their current medications and supplements  The patient's functional ability including ADLs,fall risks, home safety risks, cognitive, and hearing and visual impairment  Diet and physical activities  Evidence for depression or mood disorders  The patient's weight, height, BMI, and visual acuity have been recorded in the chart. I have made referrals, counseling, and provided education to the patient based on review of the above and I have provided the patient with a written personalized care plan for preventive services.

## 2013-09-17 NOTE — Assessment & Plan Note (Signed)
Cholesterol looks good no change in medications. Continue to work on [employer]

## 2013-09-17 NOTE — Assessment & Plan Note (Signed)
We discussed imaging however she wants to hold off at this time. Advise her to try the Aleve as well as a topical Aspercreme for her joint to see if this helps first. She has had steroid injections in the past which did give her some relief. She can also continue the glucosamine

## 2013-09-17 NOTE — Assessment & Plan Note (Signed)
Blood pressure is well-controlled no change in medication 

## 2013-09-17 NOTE — Assessment & Plan Note (Signed)
She has a mild paresthesias or monofilament is normal these are not consistent symptoms therefore I would not wait for quite a neuropathy. We'll continue to monitor this.

## 2013-10-08 ENCOUNTER — Ambulatory Visit
Admission: RE | Admit: 2013-10-08 | Discharge: 2013-10-08 | Disposition: A | Discharge status: Home / Self Care | Payer: Self-pay | Source: Ambulatory Visit | Attending: Family Medicine | Admitting: Family Medicine

## 2013-10-08 ENCOUNTER — Encounter (INDEPENDENT_AMBULATORY_CARE_PROVIDER_SITE_OTHER): Payer: Self-pay

## 2013-10-08 DIAGNOSIS — M8580 Other specified disorders of bone density and structure, unspecified site: Secondary | ICD-10-CM

## 2013-10-08 LAB — HM DEXA SCAN

## 2013-10-18 ENCOUNTER — Encounter: Payer: Self-pay | Admitting: Family Medicine

## 2013-11-23 ENCOUNTER — Telehealth: Payer: Self-pay | Admitting: Oncology

## 2013-11-23 NOTE — Telephone Encounter (Signed)
PAL - per BS moved 8/14 appt from BS to [facility]. lmonvm for pt re appt for 8/11 and 8/14. schedule mailed.

## 2014-01-11 ENCOUNTER — Telehealth: Payer: Self-pay | Admitting: Hematology and Oncology

## 2014-01-11 NOTE — Telephone Encounter (Signed)
s.w. pt she did not want to keep appt will call back to sched appt once her sched is available.

## 2014-01-20 ENCOUNTER — Telehealth: Payer: Self-pay | Admitting: Hematology and Oncology

## 2014-01-20 ENCOUNTER — Ambulatory Visit: Payer: Medicare Other | Admitting: Family Medicine

## 2014-01-20 NOTE — Telephone Encounter (Signed)
Pt return cld from [facility] to r/s visit w/Dr. [name], r/s for 08/17 .Marland KitchenMarland KitchenMarland KitchenMarland KitchenKJ

## 2014-01-21 ENCOUNTER — Ambulatory Visit (HOSPITAL_COMMUNITY)
Admission: RE | Admit: 2014-01-21 | Discharge: 2014-01-21 | Disposition: A | Discharge status: Home / Self Care | Payer: Medicare Other | Source: Ambulatory Visit | Attending: Oncology | Admitting: Oncology

## 2014-01-21 ENCOUNTER — Other Ambulatory Visit (HOSPITAL_BASED_OUTPATIENT_CLINIC_OR_DEPARTMENT_OTHER): Payer: Medicare Other

## 2014-01-21 ENCOUNTER — Encounter (HOSPITAL_COMMUNITY): Payer: Self-pay

## 2014-01-21 DIAGNOSIS — M47812 Spondylosis without myelopathy or radiculopathy, cervical region: Secondary | ICD-10-CM | POA: Insufficient documentation

## 2014-01-21 DIAGNOSIS — R599 Enlarged lymph nodes, unspecified: Secondary | ICD-10-CM | POA: Diagnosis not present

## 2014-01-21 DIAGNOSIS — R590 Localized enlarged lymph nodes: Secondary | ICD-10-CM

## 2014-01-21 DIAGNOSIS — I7090 Unspecified atherosclerosis: Secondary | ICD-10-CM | POA: Diagnosis not present

## 2014-01-21 LAB — CBC WITH DIFFERENTIAL/PLATELET
BASO%: 0.4 % (ref 0.0–2.0)
Basophils Absolute: 0 10*3/uL (ref 0.0–0.1)
EOS%: 5.9 % (ref 0.0–7.0)
Eosinophils Absolute: 0.4 10*3/uL (ref 0.0–0.5)
HEMATOCRIT: 41.3 % (ref 34.8–46.6)
HEMOGLOBIN: 13.4 g/dL (ref 11.6–15.9)
LYMPH#: 1.3 10*3/uL (ref 0.9–3.3)
LYMPH%: 18.6 % (ref 14.0–49.7)
MCH: 29.2 pg (ref 25.1–34.0)
MCHC: 32.5 g/dL (ref 31.5–36.0)
MCV: 89.7 fL (ref 79.5–101.0)
MONO#: 0.8 10*3/uL (ref 0.1–0.9)
MONO%: 11.8 % (ref 0.0–14.0)
NEUT#: 4.4 10*3/uL (ref 1.5–6.5)
NEUT%: 63.3 % (ref 38.4–76.8)
Platelets: 213 10*3/uL (ref 145–400)
RBC: 4.6 10*6/uL (ref 3.70–5.45)
RDW: 13.9 % (ref 11.2–14.5)
WBC: 7 10*3/uL (ref 3.9–10.3)

## 2014-01-21 LAB — COMPREHENSIVE METABOLIC PANEL
ALK PHOS: 73 U/L (ref 39–117)
ALT: 32 U/L (ref 0–35)
AST: 33 U/L (ref 0–37)
Albumin: 4.2 g/dL (ref 3.5–5.2)
BILIRUBIN TOTAL: 0.6 mg/dL (ref 0.2–1.2)
BUN: 13 mg/dL (ref 6–23)
CO2: 28 mEq/L (ref 19–32)
Calcium: 9.9 mg/dL (ref 8.4–10.5)
Chloride: 101 mEq/L (ref 96–112)
Creatinine, Ser: 0.79 mg/dL (ref 0.50–1.10)
Glucose, Bld: 131 mg/dL — ABNORMAL HIGH (ref 70–99)
Potassium: 4.6 mEq/L (ref 3.5–5.3)
SODIUM: 140 meq/L (ref 135–145)
Total Protein: 7.6 g/dL (ref 6.0–8.3)

## 2014-01-21 LAB — LACTATE DEHYDROGENASE: LDH: 169 U/L (ref 94–250)

## 2014-01-21 LAB — SEDIMENTATION RATE: Sed Rate: 5 mm/hr (ref 0–22)

## 2014-01-21 MED ORDER — IOHEXOL 300 MG/ML  SOLN
100.0000 mL | Freq: Once | INTRAMUSCULAR | Status: AC | PRN
  Administered 2014-01-21: 100 mL via INTRAVENOUS

## 2014-01-22 ENCOUNTER — Telehealth: Payer: Self-pay | Admitting: Family Medicine

## 2014-01-22 NOTE — Telephone Encounter (Signed)
[phone]  Pt is calling because she had a CT done yesterday at [facility] and she had asked them to send it to [place], but they told her that we would have to request the CT is there anyway that you can contact them to get this sent over.

## 2014-01-23 NOTE — Telephone Encounter (Signed)
Contacted pt and informed her that the CT scan was in [facility] and provider will review and let her now the results.

## 2014-01-24 ENCOUNTER — Ambulatory Visit: Payer: Medicare Other | Admitting: Hematology and Oncology

## 2014-01-24 ENCOUNTER — Ambulatory Visit: Payer: Medicare Other | Admitting: Oncology

## 2014-01-27 ENCOUNTER — Ambulatory Visit: Payer: Medicare Other | Admitting: Hematology and Oncology

## 2014-01-27 ENCOUNTER — Encounter: Payer: Self-pay | Admitting: Hematology and Oncology

## 2014-01-27 ENCOUNTER — Ambulatory Visit (HOSPITAL_BASED_OUTPATIENT_CLINIC_OR_DEPARTMENT_OTHER): Payer: Medicare Other | Admitting: Hematology and Oncology

## 2014-01-27 VITALS — BP 160/77 | HR 88 | Temp 98.4°F | Resp 19 | Ht 61.0 in | Wt 175.4 lb

## 2014-01-27 DIAGNOSIS — R590 Localized enlarged lymph nodes: Secondary | ICD-10-CM

## 2014-01-27 DIAGNOSIS — R599 Enlarged lymph nodes, unspecified: Secondary | ICD-10-CM

## 2014-01-27 NOTE — Assessment & Plan Note (Signed)
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 She is not symptomatic. I recommend history, physical examination and blood work in 6 months and to defer imaging study unless there are signs and symptoms to suggest disease progression. She agreed.

## 2014-01-27 NOTE — Progress Notes (Signed)
[name] FOLLOW-UP progress notes  Patient Care Team: Susy Frizzle, MD as PCP - General (Family Medicine)  CHIEF COMPLAINTS/PURPOSE OF VISIT:  Lymphadenopathy  HISTORY OF PRESENTING ILLNESS:  Cathy [name] 68 y.o. female was transferred to my care after her prior physician has left.  I reviewed the patient's records extensive and collaborated the history with the patient. 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 She had an incisional biopsy on October 25 which showed atypical lymphoid hyperplasia. Biopsies were sent for second opinion. This appeared to be a T-cell process with increased CD4 positive T lymphocytes but a firm diagnosis of lymphoma could not be established. CT scans of the neck chest abdomen and pelvis showed adenopathy in the left supraclavicular and left axillary regions and a single large right paratracheal lymph node 2 cm in diameter. No abdominal or pelvic adenopathy and no splenomegaly.  She had no constitutional symptoms. Lab testing showed previous exposure to EBV and CMV viruses with elevated IgG but not IgM titers. There was also a significant elevation of IgG against toxoplasmosis. She had no history of exposure to cats or cat litter. She was not anemic. She had a normal white blood count and differential. Serum LDH normal. ESR 4 mm.  I elected to follow her with close observation alone and serial CT scans. She has remained asymptomatic.   Since she was last seen here, she denies any progression of the palpable lymphadenopathy on her neck on new lymphadenopathy. Denies recent infection. MEDICAL HISTORY:  Past Medical History  Diagnosis Date  . Lymphadenopathy of left cervical region 08/01/2011  . Hilar lymphadenopathy 08/01/2011  . Post-menopause   . Abnormal  glucose   . Vision changes   . Benign breast cyst in female   . 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     Grade 2  . Nephrolithiasis 1984  . Asthma     Allergy induced  . GERD (gastroesophageal reflux disease)   . Hyperlipidemia   . Hypertension   . Osteopenia     SURGICAL HISTORY: Past Surgical History  Procedure Laterality Date  . Cholecystectomy    . Lithotripsy  1984  . Lymph node biopsy      SOCIAL HISTORY: History   Social History  . Marital Status: Widowed    Spouse Name: N/A    Number of Children: N/A  . Years of Education: N/A   Occupational History  . Not on file.   Social History Main Topics  . Smoking status: Never Smoker   . Smokeless tobacco: Never Used  . Alcohol Use: No  . Drug Use: No  . Sexual Activity: Not Currently   Other Topics Concern  . Not on file   Social History Narrative  . No narrative on file    FAMILY HISTORY: Family History  Problem Relation Age of Onset  . Hypertension Mother   . Stroke Mother   . Heart attack Mother     CABG, 5 Stents  . Dementia Mother   . Cancer Father     Kidney  . Diabetes Brother     Borderline  . Alcohol abuse Brother   . Asthma Sister   . Cancer Maternal Aunt     stomach ca  . Cancer Maternal Aunt     ovarian ca    ALLERGIES:  is allergic to codeine.  MEDICATIONS:  Current Outpatient Prescriptions  Medication Sig Dispense Refill  . albuterol (PROVENTIL HFA;VENTOLIN HFA) 108 (90 BASE) MCG/ACT inhaler Inhale 2 puffs into the lungs every 6 (six) hours as needed.      Marland Kitchen aspirin 81 MG tablet Take 81 mg by mouth daily.      . calcium carbonate (OS-CAL) 600 MG TABS Take 600 mg by mouth daily.      Marland Kitchen glucosamine-chondroitin 500-400 MG tablet Take 1 tablet by mouth 2 (two) times daily.      Marland Kitchen losartan (COZAAR) 50 MG tablet Take 1 tablet (50 mg total) by mouth daily.  90 tablet  2  . LYSINE PO Take by mouth daily.      . metoprolol succinate (TOPROL-XL) 50 MG 24 hr tablet Take 1 tablet (50 mg  total) by mouth daily. Take with or immediately following a meal.  90 tablet  2  . Multiple Vitamin (MULTIVITAMIN) capsule Take 1 capsule by mouth daily.      . Multiple Vitamins-Minerals (HAIR/SKIN/NAILS PO) Take 1 tablet by mouth daily.      Marland Kitchen OVER THE COUNTER MEDICATION Tumeric once daily      . pravastatin (PRAVACHOL) 20 MG tablet Take 1 tablet (20 mg total) by mouth daily.  90 tablet  2  . ranitidine (ZANTAC) 150 MG tablet Take 1 tablet (150 mg total) by mouth daily.  90 tablet  2   No current facility-administered medications for this visit.    REVIEW OF SYSTEMS:   Constitutional: Denies fevers, chills or abnormal night sweats Eyes: Denies blurriness of vision, double vision or watery eyes Ears, nose, mouth, throat, and face: Denies mucositis or sore throat Respiratory: Denies cough, dyspnea or wheezes Cardiovascular: Denies palpitation, chest discomfort or lower extremity swelling Gastrointestinal:  Denies nausea, heartburn or change in bowel habits Skin: Denies abnormal skin rashes Lymphatics: Denies new lymphadenopathy or easy bruising Neurological:Denies numbness, tingling or new weaknesses Behavioral/Psych: Mood is stable, no new changes  All other systems were reviewed with the patient and are negative.  PHYSICAL EXAMINATION: ECOG PERFORMANCE STATUS: 0 - Asymptomatic  Filed Vitals:   01/27/14 1246  BP: 160/77  Pulse: 88  Temp: 98.4 F (36.9 C)  Resp: 19   Filed Weights   01/27/14 1246  Weight: 175 lb 6.4 oz (79.561 kg)    GENERAL:alert, no distress and comfortable ears she is mildly obese SKIN: skin color, texture, turgor are normal, no rashes or significant lesions EYES: normal, conjunctiva are pink and non-injected, sclera clear OROPHARYNX:no exudate, normal lips, buccal mucosa, and tongue  NECK: supple, noted surgical scar on the left neck with probable lymphadenopathy  LYMPH:  no palpable lymphadenopathy in the cervical, axillary or inguinal LUNGS: clear  to auscultation and percussion with normal breathing effort HEART: regular rate & rhythm and no murmurs without lower extremity edema ABDOMEN:abdomen soft, non-tender and normal bowel sounds Musculoskeletal:no cyanosis of digits and no clubbing  PSYCH: alert & oriented x 3 with fluent speech NEURO: no focal motor/sensory deficits  LABORATORY DATA:  I have reviewed the data as listed Lab Results  Component Value Date   WBC 7.0 01/21/2014   HGB 13.4 01/21/2014   HCT 41.3 01/21/2014   MCV 89.7 01/21/2014   PLT 213 01/21/2014    Recent Labs  02/15/13 0807 07/23/13 0802 09/10/13 0948 01/21/14 0753  NA 138 141 140 140  K 4.6 4.3 4.6 4.6  CL 102  --  101 101  CO2 $Re'29 28 27 28  'qIf$ GLUCOSE  115* 118 112* 131*  BUN 11 13.$RemoveB'1 14 13  'SxYYXUQW$ CREATININE 0.74 0.8 0.71 0.79  CALCIUM 9.5 10.3 9.7 9.9  GFRNONAA 85  --  89  --   GFRAA >89  --  >89  --   PROT 7.3 7.6 7.3 7.6  ALBUMIN 4.4 4.5 4.6 4.2  AST 31 30 32 33  ALT $Re'28 29 30 'hHh$ 32  ALKPHOS 67 76 70 73  BILITOT 0.7 0.83 0.7 0.6    RADIOGRAPHIC STUDIES: I reviewed the imaging with the patient I have personally reviewed the radiological images as listed and agreed with the findings in the report. Ct Soft Tissue Neck W Contrast  01/21/2014   CLINICAL DATA:  Idiopathic lymphadenopathy with a inconclusive biopsy of a supraclavicular lymph node several years ago.  EXAM: CT NECK WITH CONTRAST  TECHNIQUE: Multidetector CT imaging of the neck was performed using the standard protocol following the bolus administration of intravenous contrast.  CONTRAST:  114mL OMNIPAQUE IOHEXOL 300 MG/ML  SOLN  COMPARISON:  Previous imaging studies were ordered and performed at [facility]. These are not available on the [facility] PACS timeline for comparison.  FINDINGS: Suprahyoid neck: No worrisome masses are observed. There are prominent palatine tonsillar calcifications on the LEFT which in conjunction with the noncalcified tonsillar tissue mildly efface the oropharynx, non  worrisome.  Larynx:  Normal.  Infrahyoid neck:  Normal.  Lymph nodes: LEFT greater than RIGHT level VB lymph node enlargement, extending from the low neck into the supraclavicular region. Index lesion on sagittal image 49 measures 18 mm short axis. Additional pathologically enlarged node on coronal image 30 measures 15 mm in the supraclavicular region on the LEFT.  Upper chest/mediastinum:  Reported separately.  Additional: Atherosclerosis. Cervical spondylosis. Negative intracranial compartment.  IMPRESSION: LEFT greater than RIGHT level VB lymph node enlargement as described above.   Electronically Signed   By: Rolla Flatten M.D.   On: 01/21/2014 10:01   Ct Chest W Contrast  01/21/2014   CLINICAL DATA:  Persistent lymphadenopathy  EXAM: CT CHEST WITH CONTRAST  TECHNIQUE: Multidetector CT imaging of the chest was performed during intravenous contrast administration.  CONTRAST:  15mL OMNIPAQUE IOHEXOL 300 MG/ML  SOLN  COMPARISON:  Outside CT 07/23/2013  FINDINGS: There is left-sided supraclavicular and axillary lymphadenopathy as well as mediastinal adenopathy not changed from comparison exam. For example high left axillary lymph node measures 18 mm (image 6 series 2) compared to 18 mm on prior. Lymph node in the posterior left triangle measures 16 mm (image number 3) compared to 15 mm on prior. Infraclavicular lymph node measures 11 mm (image number 2) compared to 11 mm on prior.  Within the mediastinum, right paratracheal lymph node measures 15 mm (image number 12) compared to 14 mm on prior. No hilar lymphadenopathy. No pericardial fluid. No central pulmonary embolism.  Review of the lung windows demonstrates no suspicious pulmonary nodules.  Limited view of the upper abdomen shows no focal hepatic lesion. Spleen is normal. No periportal or retroperitoneal lymphadenopathy. No retrocrural adenopathy.  Limited view of the skeleton demonstrates no aggressive osseous lesion.  IMPRESSION: 1. Stable left axillary,  supraclavicular, and mediastinal lymphadenopathy compared to outside CT of 07/15/2013. 2. No evidence of adenopathy in the upper abdomen.   Electronically Signed   By: Suzy Bouchard M.D.   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 She is not symptomatic. I recommend history, physical examination and  blood work in 6 months and to defer imaging study unless there are signs and symptoms to suggest disease progression. She agreed.    Orders Placed This Encounter  Procedures  . CBC with Differential    Standing Status: Future     Number of Occurrences:      Standing Expiration Date: 03/03/2015  . Comprehensive metabolic panel    Standing Status: Future     Number of Occurrences:      Standing Expiration Date: 03/03/2015  . Lactate dehydrogenase    Standing Status: Future     Number of Occurrences:      Standing Expiration Date: 03/03/2015    All questions were answered. The patient knows to call the clinic with any problems, questions or concerns. I spent 20 minutes counseling the patient face to face. The total time spent in the appointment was 25 minutes and more than 50% was on counseling.     [facility], Jastin Fore, MD 01/27/2014 8:41 PM

## 2014-01-30 ENCOUNTER — Telehealth: Payer: Self-pay | Admitting: Hematology and Oncology

## 2014-01-30 NOTE — Telephone Encounter (Signed)
s.w. pt and advised on Feb 2016 appt ....pt ok and aware °

## 2014-02-25 ENCOUNTER — Ambulatory Visit: Payer: Medicare Other

## 2014-03-03 ENCOUNTER — Encounter: Payer: Self-pay | Admitting: Family Medicine

## 2014-03-03 ENCOUNTER — Ambulatory Visit (INDEPENDENT_AMBULATORY_CARE_PROVIDER_SITE_OTHER): Payer: Medicare Other | Admitting: Family Medicine

## 2014-03-03 ENCOUNTER — Ambulatory Visit (HOSPITAL_COMMUNITY)
Admission: RE | Admit: 2014-03-03 | Discharge: 2014-03-03 | Disposition: A | Discharge status: Home / Self Care | Payer: Medicare Other | Source: Ambulatory Visit | Attending: Family Medicine | Admitting: Family Medicine

## 2014-03-03 VITALS — BP 132/74 | HR 68 | Temp 98.3°F | Resp 12 | Ht 61.5 in | Wt 172.0 lb

## 2014-03-03 DIAGNOSIS — M1712 Unilateral primary osteoarthritis, left knee: Secondary | ICD-10-CM

## 2014-03-03 DIAGNOSIS — R7303 Prediabetes: Secondary | ICD-10-CM

## 2014-03-03 DIAGNOSIS — I10 Essential (primary) hypertension: Secondary | ICD-10-CM

## 2014-03-03 DIAGNOSIS — M1710 Unilateral primary osteoarthritis, unspecified knee: Secondary | ICD-10-CM

## 2014-03-03 DIAGNOSIS — R7309 Other abnormal glucose: Secondary | ICD-10-CM

## 2014-03-03 DIAGNOSIS — E785 Hyperlipidemia, unspecified: Secondary | ICD-10-CM

## 2014-03-03 LAB — LIPID PANEL
CHOLESTEROL: 146 mg/dL (ref 0–200)
HDL: 36 mg/dL — AB (ref >39)
LDL CALC: 67 mg/dL (ref 0–99)
Total CHOL/HDL Ratio: 4.1 Ratio
Triglycerides: 216 mg/dL — ABNORMAL HIGH (ref <150)
VLDL: 43 mg/dL — AB (ref 0–40)

## 2014-03-03 LAB — HEMOGLOBIN A1C
Hgb A1c MFr Bld: 6.5 % — ABNORMAL HIGH (ref <5.7)
Mean Plasma Glucose: 140 mg/dL — ABNORMAL HIGH (ref <117)

## 2014-03-03 MED ORDER — TRAMADOL HCL 50 MG PO TABS: 50.0000 mg | ORAL_TABLET | Freq: Three times a day (TID) | ORAL | Status: DC | PRN

## 2014-03-03 NOTE — Patient Instructions (Signed)
Steroid shot given Decrease aleve to 1 tablet twice a day at most Take Ultram for pain  Get xray of knee We will call with lab results F/U 4 months

## 2014-03-04 NOTE — Progress Notes (Signed)
Patient ID: Buffey Zabinski, female   DOB: 1945/11/11, 68 y.o.   MRN: [id number]   Subjective:    Patient ID: Kindred Heying, female    DOB: [birth year], 68 y.o.   MRN: [id number]  Patient presents for F/U and Knee Pain  patient here for blood work. She's history of borderline diabetes mellitus she had labs recently done with her oncologist which showed a fasting glucose of 131. She's also due for repeat cholesterol panel. She's been trying to watch her diet.  History of chronic knee pain with worsening left knee pain recently. She states that crutches and is very stiff. She's been taking a significant amount of anti-inflammatories she typically takes 2 ibuprofen to 3 times a day and also mixes and Aleve a couple times a day. She started on Zantac for acid reflux which she had in the past but has not been very symptomatic even with all the NSAIDs. She has been also taking tumeric which helps with the swelling of her joints as well as her varicose veins. She's not had any recent imaging of her knee but does not want to proceed with any surgical intervention. She has had a steroid injection in the past and did well with this.    Review Of Systems:  GEN- denies fatigue, fever, weight loss,weakness, recent illness HEENT- denies eye drainage, change in vision, nasal discharge, CVS- denies chest pain, palpitations RESP- denies SOB, cough, wheeze ABD- denies N/V, change in stools, abd pain GU- denies dysuria, hematuria, dribbling, incontinence MSK- +joint pain, muscle aches, injury Neuro- denies headache, dizziness, syncope, seizure activity       Objective:    BP 132/74  Pulse 68  Temp(Src) 98.3 F (36.8 C) (Oral)  Resp 12  Ht 5' 1.5" (1.562 m)  Wt 172 lb (78.019 kg)  BMI 31.98 kg/m2 GEN- NAD, alert and oriented x3 HEENT- PERRL, EOMI, non injected sclera, pink conjunctiva, MMM, oropharynx clear CVS- RRR, no murmur RESP-CTAB MSK- right knee normal inspection, no effusion, good ROM, left knee-  small effusion, no warmth, decreased ROM with flexion, +crepitus, no erythema of skin, non antalgic gait EXT- No edema Pulses- Radial, DP- 2+   Procedure- Knee Injection Procedure explained to patient questions answered benefits and risks discussed verbal consent obtained. Antiseptic-betadine Injection- Kenalog 40mg , marcaine 2% 34ml, lidocaine 1% 31ml Pt tolerated procedure well Bandage applied         Assessment & Plan:      Problem List Items Addressed This Visit   Pre-diabetes - Primary   Relevant Orders      Hemoglobin A1c (Completed)   OA (osteoarthritis) of knee   Relevant Medications      traMADol (ULTRAM) tablet 50 mg   Other Relevant Orders      DG Knee 4 Views W/Patella Left (Completed)   Hypertension   Hyperlipidemia   Relevant Orders      Lipid panel (Completed)      Note: This dictation was prepared with Dragon dictation along with smaller phrase technology. Any transcriptional errors that result from this process are unintentional.

## 2014-03-04 NOTE — Assessment & Plan Note (Signed)
S/p knee injection tolerated well Will decrease use of NSAIDS Aleve no more than twice a day Ultram for severe pain Recheck renal function

## 2014-03-04 NOTE — Assessment & Plan Note (Signed)
Recheck A1C, concern for DM with fasting of 131 Work on dietary changes

## 2014-03-04 NOTE — Assessment & Plan Note (Signed)
BP looks good no change to meds 

## 2014-03-04 NOTE — Assessment & Plan Note (Signed)
On pravastatin labs showed elevated TG, work on dietary changes, add fish oil

## 2014-03-05 ENCOUNTER — Other Ambulatory Visit: Payer: Self-pay | Admitting: *Deleted

## 2014-03-05 MED ORDER — BLOOD GLUCOSE METER KIT: PACK | Status: DC

## 2014-04-15 ENCOUNTER — Other Ambulatory Visit: Payer: Self-pay

## 2014-04-15 DIAGNOSIS — Z1231 Encounter for screening mammogram for malignant neoplasm of breast: Secondary | ICD-10-CM

## 2014-04-30 ENCOUNTER — Other Ambulatory Visit: Payer: Self-pay | Admitting: Family Medicine

## 2014-04-30 NOTE — Telephone Encounter (Signed)
Medication refilled per protocol. 

## 2014-05-06 ENCOUNTER — Other Ambulatory Visit: Payer: Self-pay | Admitting: Family Medicine

## 2014-05-06 NOTE — Telephone Encounter (Signed)
Refill appropriate and filled per protocol. 

## 2014-05-26 ENCOUNTER — Ambulatory Visit
Admission: RE | Admit: 2014-05-26 | Discharge: 2014-05-26 | Disposition: A | Discharge status: Home / Self Care | Payer: Medicare Other | Source: Ambulatory Visit

## 2014-05-26 DIAGNOSIS — Z1231 Encounter for screening mammogram for malignant neoplasm of breast: Secondary | ICD-10-CM

## 2014-06-30 ENCOUNTER — Ambulatory Visit: Payer: Medicare Other | Admitting: Family Medicine

## 2014-07-08 ENCOUNTER — Encounter: Payer: Self-pay | Admitting: Family Medicine

## 2014-07-08 ENCOUNTER — Ambulatory Visit (INDEPENDENT_AMBULATORY_CARE_PROVIDER_SITE_OTHER): Payer: 59 | Admitting: Family Medicine

## 2014-07-08 ENCOUNTER — Ambulatory Visit: Payer: Medicare Other | Admitting: Family Medicine

## 2014-07-08 VITALS — BP 158/76 | HR 78 | Temp 98.0°F | Resp 16 | Ht 61.5 in | Wt 171.0 lb

## 2014-07-08 DIAGNOSIS — R599 Enlarged lymph nodes, unspecified: Secondary | ICD-10-CM

## 2014-07-08 DIAGNOSIS — M170 Bilateral primary osteoarthritis of knee: Secondary | ICD-10-CM

## 2014-07-08 DIAGNOSIS — E785 Hyperlipidemia, unspecified: Secondary | ICD-10-CM

## 2014-07-08 DIAGNOSIS — R7303 Prediabetes: Secondary | ICD-10-CM

## 2014-07-08 DIAGNOSIS — R7309 Other abnormal glucose: Secondary | ICD-10-CM

## 2014-07-08 DIAGNOSIS — I10 Essential (primary) hypertension: Secondary | ICD-10-CM

## 2014-07-08 DIAGNOSIS — R590 Localized enlarged lymph nodes: Secondary | ICD-10-CM

## 2014-07-08 DIAGNOSIS — J4520 Mild intermittent asthma, uncomplicated: Secondary | ICD-10-CM

## 2014-07-08 MED ORDER — METOPROLOL SUCCINATE ER 50 MG PO TB24: ORAL_TABLET | ORAL | Status: DC

## 2014-07-08 MED ORDER — LOSARTAN POTASSIUM 100 MG PO TABS: 50.0000 mg | ORAL_TABLET | Freq: Every day | ORAL | Status: DC

## 2014-07-08 MED ORDER — PRAVASTATIN SODIUM 20 MG PO TABS: 20.0000 mg | ORAL_TABLET | Freq: Every day | ORAL | Status: DC

## 2014-07-08 MED ORDER — RANITIDINE HCL 150 MG PO TABS: 150.0000 mg | ORAL_TABLET | Freq: Every day | ORAL | Status: DC

## 2014-07-08 MED ORDER — ALBUTEROL SULFATE HFA 108 (90 BASE) MCG/ACT IN AERS: 2.0000 | INHALATION_SPRAY | RESPIRATORY_TRACT | Status: DC | PRN

## 2014-07-08 NOTE — Assessment & Plan Note (Signed)
Her blood pressure has been trending up over the past few months. She had an elevated blood pressure even at her oncologist office a few months back. I'll increase her losartan to 100 mg once a day. She will continue her Toprol at the current dose her heart rates in the 70s and I do not want to cause any bradycardia or increase fatigue. I will check her renal function today. She will buy a home monitoring system and we will follow up by phone with her blood pressure readings

## 2014-07-08 NOTE — Assessment & Plan Note (Signed)
Recheck triglycerides and LDL she's currently on pravastatin and Fish oil we also discussed dietary changes

## 2014-07-08 NOTE — Assessment & Plan Note (Signed)
Her fasting blood sugars have looked good she is not currently on any medications for A1c is greater than 6.5% we will start metformin therapy.

## 2014-07-08 NOTE — Assessment & Plan Note (Signed)
Mild intermittent typically allergy induced. Albuterol as needed she's had a flu shot

## 2014-07-08 NOTE — Progress Notes (Signed)
Patient ID: Cathy [name], female   DOB: 10-Jan-1946, 69 y.o.   MRN: [id number]   Subjective:    Patient ID: Cathy [name], female    DOB: 1945-11-30, 69 y.o.   MRN: [id number]  Patient presents for 4 month F/U  Patient here to follow-up chronic medical problems. At her last visit her A1c was elevated at 6.5% she is currently being treated as prediabetic. She's been checking her blood sugars 3 times a week her blood sugars range from 108 to 120s. She did have one elevated to146 after eating some birthday cake. Her overall average was 114  She's been taking fish oil as well as her pravastatin as prescribed as well as her blood pressure medicines and has not had any difficulties with these. 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 She does use ibuprofen typically once a day she has not tried the tramadol for her knee.  She is follow-up with her oncologist next month   Review Of Systems:  GEN- denies fatigue, fever, weight loss,weakness, recent illness HEENT- denies eye drainage, change in vision, nasal discharge, CVS- denies chest pain, palpitations RESP- denies SOB, cough, wheeze ABD- denies N/V, change in stools, abd pain GU- denies dysuria, hematuria, dribbling, incontinence MSK- denies joint pain, muscle aches, injury Neuro- denies headache, dizziness, syncope, seizure activity       Objective:    BP 158/76 mmHg  Pulse 78  Temp(Src) 98 F (36.7 C) (Oral)  Resp 16  Ht 5' 1.5" (1.562 m)  Wt 171 lb (77.565 kg)  BMI 31.79 kg/m2             Left arm- 158/72 GEN- NAD, alert and oriented x3 HEENT- PERRL, EOMI, non injected sclera, pink conjunctiva, MMM, oropharynx clear Neck- Supple, no thyromegaly CVS- RRR, no murmur RESP-CTAB EXT- No edema Pulses- Radial, DP- 2+        Assessment & Plan:      Problem List Items Addressed This Visit      Unprioritized   Pre-diabetes -  Primary   Relevant Orders   CBC with Differential/Platelet   Comprehensive metabolic panel   Hemoglobin A1c   Lymphadenopathy of left cervical region   Relevant Medications   albuterol (PROVENTIL HFA;VENTOLIN HFA) 108 (90 BASE) MCG/ACT inhaler   Hypertension   Relevant Medications   losartan (COZAAR) tablet   metoprolol succinate (TOPROL-XL) 24 hr tablet   pravastatin (PRAVACHOL) tablet   Hyperlipidemia   Relevant Medications   losartan (COZAAR) tablet   metoprolol succinate (TOPROL-XL) 24 hr tablet   pravastatin (PRAVACHOL) tablet   Other Relevant Orders   Lipid panel   Hilar lymphadenopathy   Relevant Medications   albuterol (PROVENTIL HFA;VENTOLIN HFA) 108 (90 BASE) MCG/ACT inhaler   Asthma   Relevant Medications   albuterol (PROVENTIL HFA;VENTOLIN HFA) 108 (90 BASE) MCG/ACT inhaler      Note: This dictation was prepared with Dragon dictation along with smaller phrase technology. Any transcriptional errors that result from this process are unintentional.

## 2014-07-08 NOTE — Patient Instructions (Signed)
Increase losartan to 100mg  for blood pressure Continue all other medications I will call with lab results F/u blood pressure by phone in 2 weeks F/U 4 months

## 2014-07-08 NOTE — Assessment & Plan Note (Signed)
Note she has done well status post the knee injection. I discussed with her chronic NSAIDs and the effects on the GI system as well as her kidneys. She can try acetaminophen twice a day if she does not one to take the tramadol

## 2014-07-09 LAB — COMPREHENSIVE METABOLIC PANEL
ALT: 33 U/L (ref 0–35)
AST: 37 U/L (ref 0–37)
Albumin: 4.5 g/dL (ref 3.5–5.2)
Alkaline Phosphatase: 63 U/L (ref 39–117)
BILIRUBIN TOTAL: 0.7 mg/dL (ref 0.2–1.2)
BUN: 13 mg/dL (ref 6–23)
CO2: 29 meq/L (ref 19–32)
Calcium: 10 mg/dL (ref 8.4–10.5)
Chloride: 102 mEq/L (ref 96–112)
Creat: 0.62 mg/dL (ref 0.50–1.10)
Glucose, Bld: 102 mg/dL — ABNORMAL HIGH (ref 70–99)
Potassium: 4.5 mEq/L (ref 3.5–5.3)
Sodium: 139 mEq/L (ref 135–145)
TOTAL PROTEIN: 7.3 g/dL (ref 6.0–8.3)

## 2014-07-09 LAB — CBC WITH DIFFERENTIAL/PLATELET
BASOS ABS: 0 10*3/uL (ref 0.0–0.1)
BASOS PCT: 0 % (ref 0–1)
EOS PCT: 2 % (ref 0–5)
Eosinophils Absolute: 0.2 10*3/uL (ref 0.0–0.7)
HCT: 43 % (ref 36.0–46.0)
Hemoglobin: 13.9 g/dL (ref 12.0–15.0)
LYMPHS PCT: 18 % (ref 12–46)
Lymphs Abs: 1.4 10*3/uL (ref 0.7–4.0)
MCH: 28.5 pg (ref 26.0–34.0)
MCHC: 32.3 g/dL (ref 30.0–36.0)
MCV: 88.3 fL (ref 78.0–100.0)
MPV: 10 fL (ref 8.6–12.4)
Monocytes Absolute: 0.9 10*3/uL (ref 0.1–1.0)
Monocytes Relative: 12 % (ref 3–12)
NEUTROS ABS: 5.2 10*3/uL (ref 1.7–7.7)
Neutrophils Relative %: 68 % (ref 43–77)
Platelets: 226 10*3/uL (ref 150–400)
RBC: 4.87 MIL/uL (ref 3.87–5.11)
RDW: 14.2 % (ref 11.5–15.5)
WBC: 7.6 10*3/uL (ref 4.0–10.5)

## 2014-07-09 LAB — LIPID PANEL
CHOL/HDL RATIO: 3.7 ratio
Cholesterol: 155 mg/dL (ref 0–200)
HDL: 42 mg/dL (ref >39)
LDL Cholesterol: 81 mg/dL (ref 0–99)
Triglycerides: 162 mg/dL — ABNORMAL HIGH (ref <150)
VLDL: 32 mg/dL (ref 0–40)

## 2014-07-09 LAB — HEMOGLOBIN A1C
HEMOGLOBIN A1C: 6.1 % — AB (ref <5.7)
Mean Plasma Glucose: 128 mg/dL — ABNORMAL HIGH (ref <117)

## 2014-07-23 ENCOUNTER — Telehealth: Payer: Self-pay | Admitting: Family Medicine

## 2014-07-23 NOTE — Telephone Encounter (Signed)
-----   Message from [place], LPN sent at 2/50/0370  4:45 PM EST ----- Regarding: RE: f/u BP readings at home- [facility] Call placed to patient.   Patient reports that she has not been diligent in taking BP readings.   Reports that her last readings are as follows: 164/81 139/79 161/78 154/74 137/77.  Reports that her HR has been running 70-74.  Advised to monitor BP 1 hour after taking HTN meditations QAM.   Reports that she has been taking BP throughout day when she remembers it.   States that she will contact office with readings again next week. ----- Message -----    From: Alycia Rossetti, MD    Sent: 07/23/2014   8:00 AM      To: [place]athe [place], LPN Subject: FW: f/u BP readings at home- [facility]       Call pt and see what her BP has been running, I increased losartan at last visit    ----- Message -----    From: Alycia Rossetti, MD    Sent: 07/23/2014      To: Alycia Rossetti, MD Subject: f/u BP readings at home- [facility]

## 2014-07-25 ENCOUNTER — Telehealth: Payer: Self-pay | Admitting: Hematology and Oncology

## 2014-07-25 NOTE — Telephone Encounter (Signed)
pt called to r/s appt....done...pt aware of new date and time

## 2014-07-28 ENCOUNTER — Ambulatory Visit: Payer: Medicare Other | Admitting: Hematology and Oncology

## 2014-07-28 ENCOUNTER — Other Ambulatory Visit: Payer: Medicare Other

## 2014-08-01 ENCOUNTER — Telehealth: Payer: Self-pay | Admitting: Family Medicine

## 2014-08-01 NOTE — Telephone Encounter (Signed)
MD to be made aware.  

## 2014-08-01 NOTE — Telephone Encounter (Signed)
These blood pressures look okay, no change to losartan dose. Keep previous F/U appt  Let us know if her BP starts to stay above 150/90

## 2014-08-01 NOTE — Telephone Encounter (Signed)
[phone] PT called with her BP and pulse readings over past few days 142/75 and pulse 74 142/73 and pulse 72 141/72 and pulse 85 149/73 and pulse 77 136/74 and pulse 71 133/72 and pulse 78 126/69 and pulse 71 149/77 and pulse 71

## 2014-08-01 NOTE — Telephone Encounter (Signed)
Call placed to patient and patient made aware.  

## 2014-08-18 ENCOUNTER — Ambulatory Visit (HOSPITAL_BASED_OUTPATIENT_CLINIC_OR_DEPARTMENT_OTHER): Payer: Medicare Other | Admitting: Hematology and Oncology

## 2014-08-18 ENCOUNTER — Encounter: Payer: Self-pay | Admitting: Hematology and Oncology

## 2014-08-18 ENCOUNTER — Telehealth: Payer: Self-pay | Admitting: Hematology and Oncology

## 2014-08-18 ENCOUNTER — Other Ambulatory Visit (HOSPITAL_BASED_OUTPATIENT_CLINIC_OR_DEPARTMENT_OTHER): Payer: Medicare Other

## 2014-08-18 VITALS — BP 144/69 | HR 73 | Temp 98.0°F | Resp 18 | Ht 61.5 in | Wt 171.2 lb

## 2014-08-18 DIAGNOSIS — R599 Enlarged lymph nodes, unspecified: Secondary | ICD-10-CM

## 2014-08-18 DIAGNOSIS — R590 Localized enlarged lymph nodes: Secondary | ICD-10-CM

## 2014-08-18 LAB — CBC WITH DIFFERENTIAL/PLATELET
BASO%: 0.3 % (ref 0.0–2.0)
BASOS ABS: 0 10*3/uL (ref 0.0–0.1)
EOS%: 4.3 % (ref 0.0–7.0)
Eosinophils Absolute: 0.3 10*3/uL (ref 0.0–0.5)
HCT: 42.7 % (ref 34.8–46.6)
HGB: 14.1 g/dL (ref 11.6–15.9)
LYMPH%: 21.6 % (ref 14.0–49.7)
MCH: 29.4 pg (ref 25.1–34.0)
MCHC: 33 g/dL (ref 31.5–36.0)
MCV: 89 fL (ref 79.5–101.0)
MONO#: 0.9 10*3/uL (ref 0.1–0.9)
MONO%: 12.6 % (ref 0.0–14.0)
NEUT#: 4.3 10*3/uL (ref 1.5–6.5)
NEUT%: 61.2 % (ref 38.4–76.8)
PLATELETS: 210 10*3/uL (ref 145–400)
RBC: 4.8 10*6/uL (ref 3.70–5.45)
RDW: 13.5 % (ref 11.2–14.5)
WBC: 7 10*3/uL (ref 3.9–10.3)
lymph#: 1.5 10*3/uL (ref 0.9–3.3)

## 2014-08-18 LAB — COMPREHENSIVE METABOLIC PANEL (CC13)
ALBUMIN: 4.2 g/dL (ref 3.5–5.0)
ALT: 28 U/L (ref 0–55)
ANION GAP: 10 meq/L (ref 3–11)
AST: 29 U/L (ref 5–34)
Alkaline Phosphatase: 75 U/L (ref 40–150)
BILIRUBIN TOTAL: 0.71 mg/dL (ref 0.20–1.20)
BUN: 13.1 mg/dL (ref 7.0–26.0)
CALCIUM: 10 mg/dL (ref 8.4–10.4)
CO2: 27 meq/L (ref 22–29)
Chloride: 105 mEq/L (ref 98–109)
Creatinine: 0.8 mg/dL (ref 0.6–1.1)
EGFR: 76 mL/min/{1.73_m2} — AB (ref >90)
Glucose: 109 mg/dl (ref 70–140)
POTASSIUM: 4.6 meq/L (ref 3.5–5.1)
SODIUM: 141 meq/L (ref 136–145)
TOTAL PROTEIN: 7.2 g/dL (ref 6.4–8.3)

## 2014-08-18 LAB — LACTATE DEHYDROGENASE (CC13): LDH: 215 U/L (ref 125–245)

## 2014-08-18 NOTE — Telephone Encounter (Signed)
lvm for pt regarding to 08/2015 appt....mailed pt appt sched and avs for pt for [phone]

## 2014-08-18 NOTE — Progress Notes (Signed)
[place] OFFICE PROGRESS NOTE  Patient Care Team: Alycia Rossetti, MD as PCP - General (Family Medicine) Heath Lark, MD as Consulting Physician (Hematology and Oncology)  SUMMARY OF ONCOLOGIC HISTORY: I reviewed the patient's records extensive and collaborated the history with the patient. 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 She had an incisional biopsy on October 25 which showed atypical lymphoid hyperplasia. Biopsies were sent for second opinion. This appeared to be a T-cell process with increased CD4 positive T lymphocytes but a firm diagnosis of lymphoma could not be established. CT scans of the neck chest abdomen and pelvis showed adenopathy in the left supraclavicular and left axillary regions and a single large right paratracheal lymph node 2 cm in diameter. No abdominal or pelvic adenopathy and no splenomegaly.  She had no constitutional symptoms. Lab testing showed previous exposure to EBV and CMV viruses with elevated IgG but not IgM titers. There was also a significant elevation of IgG against toxoplasmosis. She had no history of exposure to cats or cat litter. She was not anemic. She had a normal white blood count and differential. Serum LDH normal. ESR 4 mm.  I elected to follow her with close observation alone and serial CT scans. She has remained asymptomatic.   Since she was last seen here, she denies any progression of the palpable lymphadenopathy on her neck on new lymphadenopathy. Denies recent infection.  REVIEW OF SYSTEMS:   Constitutional: Denies fevers, chills or abnormal weight loss Eyes: Denies blurriness of vision Ears, nose, mouth, throat, and face: Denies mucositis or sore throat Respiratory: Denies cough, dyspnea or wheezes Cardiovascular: Denies  palpitation, chest discomfort or lower extremity swelling Gastrointestinal:  Denies nausea, heartburn or change in bowel habits Skin: Denies abnormal skin rashes Lymphatics: Denies new lymphadenopathy or easy bruising Neurological:Denies numbness, tingling or new weaknesses Behavioral/Psych: Mood is stable, no new changes  All other systems were reviewed with the patient and are negative.  I have reviewed the past medical history, past surgical history, social history and family history with the patient and they are unchanged from previous note.  ALLERGIES:  is allergic to codeine.  MEDICATIONS:  Current Outpatient Prescriptions  Medication Sig Dispense Refill  . ACCU-CHEK AVIVA PLUS test strip USE TO MONITOR BLOOD SUGAR 3 TIMES A WEEK 50 each 11  . albuterol (PROVENTIL HFA;VENTOLIN HFA) 108 (90 BASE) MCG/ACT inhaler Inhale 2 puffs into the lungs every 4 (four) hours as needed. 1 Inhaler 3  . aspirin 81 MG tablet Take 81 mg by mouth daily.    . calcium carbonate (OS-CAL) 600 MG TABS Take 600 mg by mouth daily.    . cetirizine (ZYRTEC) 10 MG tablet Take 10 mg by mouth daily.    . Cinnamon 500 MG TABS Take 1 tablet by mouth daily.    Marland Kitchen glucosamine-chondroitin 500-400 MG tablet Take 1 tablet by mouth 2 (two) times daily.    Marland Kitchen ibuprofen (ADVIL,MOTRIN) 200 MG tablet Take 200 mg by mouth every 6 (six) hours as needed.    Marland Kitchen losartan (COZAAR) 100 MG tablet Take 0.5 tablets (50 mg total) by mouth daily. 1 tab po daily for blood pressure 90 tablet 2  . metoprolol succinate (TOPROL-XL) 50 MG 24 hr tablet TAKE 1 TABLET BY MOUTH ONCE A DAY WITH OR IMMEDIATELY FOLLOWING A MEAL 90 tablet 3  . Multiple  Vitamin (MULTIVITAMIN) capsule Take 1 capsule by mouth daily.    . Multiple Vitamins-Minerals (OCUVITE-LUTEIN PO) Take 1 tablet by mouth daily.    . Omega-3 Fatty Acids (FISH OIL) 1000 MG CAPS Take 1 capsule by mouth daily.    Marland Kitchen OVER THE COUNTER MEDICATION Tumeric once daily    . pravastatin (PRAVACHOL)  20 MG tablet Take 1 tablet (20 mg total) by mouth daily. 90 tablet 2  . ranitidine (ZANTAC) 150 MG tablet Take 1 tablet (150 mg total) by mouth daily. 90 tablet 3  . tetrahydrozoline-zinc (VISINE-AC) 0.05-0.25 % ophthalmic solution 2 drops 3 (three) times daily as needed.    . traMADol (ULTRAM) 50 MG tablet Take 1 tablet (50 mg total) by mouth every 8 (eight) hours as needed. (Patient not taking: Reported on 07/08/2014) 45 tablet 0   No current facility-administered medications for this visit.    PHYSICAL EXAMINATION: ECOG PERFORMANCE STATUS: 0 - Asymptomatic  Filed Vitals:   08/18/14 1054  BP: 144/69  Pulse: 73  Temp: 98 F (36.7 C)  Resp: 18   Filed Weights   08/18/14 1054  Weight: 171 lb 3.2 oz (77.656 kg)    GENERAL:alert, no distress and comfortable SKIN: skin color, texture, turgor are normal, no rashes or significant lesions EYES: normal, Conjunctiva are pink and non-injected, sclera clear OROPHARYNX:no exudate, no erythema and lips, buccal mucosa, and tongue normal  NECK: Noted a fluctuant nodule at the previous surgical scar on the left supraclavicular region, unchanged.  LYMPH:  no palpable lymphadenopathy in the cervical, axillary or inguinal LUNGS: clear to auscultation and percussion with normal breathing effort HEART: regular rate & rhythm and no murmurs and no lower extremity edema ABDOMEN:abdomen soft, non-tender and normal bowel sounds Musculoskeletal:no cyanosis of digits and no clubbing  NEURO: alert & oriented x 3 with fluent speech, no focal motor/sensory deficits  LABORATORY DATA:  I have reviewed the data as listed    Component Value Date/Time   NA 141 08/18/2014 1027   NA 139 07/08/2014 1304   K 4.6 08/18/2014 1027   K 4.5 07/08/2014 1304   CL 102 07/08/2014 1304   CL 103 07/16/2012 1304   CO2 27 08/18/2014 1027   CO2 29 07/08/2014 1304   GLUCOSE 109 08/18/2014 1027   GLUCOSE 102* 07/08/2014 1304   GLUCOSE 112* 07/16/2012 1304   BUN 13.1  08/18/2014 1027   BUN 13 07/08/2014 1304   CREATININE 0.8 08/18/2014 1027   CREATININE 0.62 07/08/2014 1304   CREATININE 0.79 01/21/2014 0753   CALCIUM 10.0 08/18/2014 1027   CALCIUM 10.0 07/08/2014 1304   PROT 7.2 08/18/2014 1027   PROT 7.3 07/08/2014 1304   ALBUMIN 4.2 08/18/2014 1027   ALBUMIN 4.5 07/08/2014 1304   AST 29 08/18/2014 1027   AST 37 07/08/2014 1304   ALT 28 08/18/2014 1027   ALT 33 07/08/2014 1304   ALKPHOS 75 08/18/2014 1027   ALKPHOS 63 07/08/2014 1304   BILITOT 0.71 08/18/2014 1027   BILITOT 0.7 07/08/2014 1304   GFRNONAA 89 09/10/2013 0948   GFRNONAA >60 04/01/2010 1015   GFRAA >89 09/10/2013 0948   GFRAA  04/01/2010 1015    >60        The eGFR has been calculated using the MDRD equation. This calculation has not been validated in all clinical situations. eGFR's persistently <60 mL/min signify possible Chronic Kidney Disease.    No results found for: SPEP, UPEP  Lab Results  Component Value Date   WBC 7.0  08/18/2014   NEUTROABS 4.3 08/18/2014   HGB 14.1 08/18/2014   HCT 42.7 08/18/2014   MCV 89.0 08/18/2014   PLT 210 08/18/2014      Chemistry      Component Value Date/Time   NA 141 08/18/2014 1027   NA 139 07/08/2014 1304   K 4.6 08/18/2014 1027   K 4.5 07/08/2014 1304   CL 102 07/08/2014 1304   CL 103 07/16/2012 1304   CO2 27 08/18/2014 1027   CO2 29 07/08/2014 1304   BUN 13.1 08/18/2014 1027   BUN 13 07/08/2014 1304   CREATININE 0.8 08/18/2014 1027   CREATININE 0.62 07/08/2014 1304   CREATININE 0.79 01/21/2014 0753      Component Value Date/Time   CALCIUM 10.0 08/18/2014 1027   CALCIUM 10.0 07/08/2014 1304   ALKPHOS 75 08/18/2014 1027   ALKPHOS 63 07/08/2014 1304   AST 29 08/18/2014 1027   AST 37 07/08/2014 1304   ALT 28 08/18/2014 1027   ALT 33 07/08/2014 1304   BILITOT 0.71 08/18/2014 1027   BILITOT 0.7 07/08/2014 1304      ASSESSMENT & PLAN:  Lymphadenopathy of left cervical region 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 She is not symptomatic. I recommend history, physical examination and blood work in 1 year and to defer imaging study unless there are signs and symptoms to suggest disease progression. She agreed.      Orders Placed This Encounter  Procedures  . Comprehensive metabolic panel    Standing Status: Future     Number of Occurrences:      Standing Expiration Date: 09/22/2015  . CBC with Differential/Platelet    Standing Status: Future     Number of Occurrences:      Standing Expiration Date: 09/22/2015  . Lactate dehydrogenase    Standing Status: Future     Number of Occurrences:      Standing Expiration Date: 09/22/2015   All questions were answered. The patient knows to call the clinic with any problems, questions or concerns. No barriers to learning was detected. I spent 15 minutes counseling the patient face to face. The total time spent in the appointment was 20 minutes and more than 50% was on counseling and review of test results     [facility], [place], MD 08/18/2014 11:36 AM

## 2014-08-18 NOTE — Assessment & Plan Note (Signed)
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 She is not symptomatic. I recommend history, physical examination and blood work in 1 year and to defer imaging study unless there are signs and symptoms to suggest disease progression. She agreed.

## 2014-10-10 ENCOUNTER — Ambulatory Visit (INDEPENDENT_AMBULATORY_CARE_PROVIDER_SITE_OTHER): Payer: Medicare Other | Admitting: Family Medicine

## 2014-10-10 ENCOUNTER — Encounter: Payer: Self-pay | Admitting: Family Medicine

## 2014-10-10 VITALS — BP 140/68 | HR 66 | Temp 98.5°F | Resp 14 | Ht 61.5 in | Wt 171.0 lb

## 2014-10-10 DIAGNOSIS — M170 Bilateral primary osteoarthritis of knee: Secondary | ICD-10-CM | POA: Diagnosis not present

## 2014-10-10 NOTE — Assessment & Plan Note (Signed)
Tolerated knee injection well Has OA with bone spur on xray I think she will need knee replacement eventually She wants to hold off at this time

## 2014-10-10 NOTE — Progress Notes (Signed)
Patient ID: Cathy [name], female   DOB: Jan 08, 1946, 69 y.o.   MRN: [id number]   Subjective:    Patient ID: Cathy [name], female    DOB: [birth year], 69 y.o.   MRN: [id number]  Patient presents for L Knee Pain  patient here for left knee injection. She has history of known osteoarthritis of the knee. She had a knee injection done about 6-7 months ago which she has done well into this time. She does take ibuprofen for pain occasionally she uses the tramadol. She has no other new concerns today. Her blood pressure has done well at home the top number stays at 140 the bottom number is always below 80.     Review Of Systems:  GEN- denies fatigue, fever, weight loss,weakness, recent illness HEENT- denies eye drainage, change in vision, nasal discharge, CVS- denies chest pain, palpitations RESP- denies SOB, cough, wheeze ABD- denies N/V, change in stools, abd pain GU- denies dysuria, hematuria, dribbling, incontinence MSK-+ joint pain, muscle aches, injury Neuro- denies headache, dizziness, syncope, seizure activity       Objective:    BP 140/68 mmHg  Pulse 66  Temp(Src) 98.5 F (36.9 C) (Oral)  Resp 14  Ht 5' 1.5" (1.562 m)  Wt 171 lb (77.565 kg)  BMI 31.79 kg/m2 GEN- NAD, alert and oriented x3 MSK- Left knee- mild swelling inferior knee, no warmth, decreased ROM, +crepitus, ligaments in tact EXT- No edema Pulses- DP- 2+  Procedure- Left Knee injection Procedure explained to patient questions answered benefits and risks discussed verbal consent obtained. Antiseptic-Betadine Injection- Kenalog 40mg  1cc, Lidocaine 1% 2cc, Marcaine 0.5% 2cc Minimal blood loss Patient tolerated procedure well Bandage applied          Assessment & Plan:      Problem List Items Addressed This Visit    OA (osteoarthritis) of knee - Primary    Tolerated knee injection well Has OA with bone spur on xray I think she will need knee replacement eventually She wants to hold off at this time         Note: This dictation was prepared with Dragon dictation along with smaller phrase technology. Any transcriptional errors that result from this process are unintentional.

## 2014-10-10 NOTE — Patient Instructions (Signed)
F/U as previous Ice the knee Elevate your knee

## 2014-10-28 ENCOUNTER — Ambulatory Visit (INDEPENDENT_AMBULATORY_CARE_PROVIDER_SITE_OTHER): Payer: Medicare Other | Admitting: Family Medicine

## 2014-10-28 ENCOUNTER — Encounter: Payer: Self-pay | Admitting: Family Medicine

## 2014-10-28 VITALS — BP 132/70 | HR 70 | Temp 97.9°F | Resp 16 | Ht 61.5 in | Wt 169.0 lb

## 2014-10-28 DIAGNOSIS — H8110 Benign paroxysmal vertigo, unspecified ear: Secondary | ICD-10-CM | POA: Insufficient documentation

## 2014-10-28 DIAGNOSIS — R011 Cardiac murmur, unspecified: Secondary | ICD-10-CM | POA: Diagnosis not present

## 2014-10-28 DIAGNOSIS — R599 Enlarged lymph nodes, unspecified: Secondary | ICD-10-CM | POA: Diagnosis not present

## 2014-10-28 DIAGNOSIS — M8580 Other specified disorders of bone density and structure, unspecified site: Secondary | ICD-10-CM

## 2014-10-28 DIAGNOSIS — I10 Essential (primary) hypertension: Secondary | ICD-10-CM

## 2014-10-28 DIAGNOSIS — R590 Localized enlarged lymph nodes: Secondary | ICD-10-CM

## 2014-10-28 DIAGNOSIS — R7309 Other abnormal glucose: Secondary | ICD-10-CM

## 2014-10-28 DIAGNOSIS — Z0000 Encounter for general adult medical examination without abnormal findings: Secondary | ICD-10-CM | POA: Diagnosis not present

## 2014-10-28 DIAGNOSIS — R7303 Prediabetes: Secondary | ICD-10-CM

## 2014-10-28 DIAGNOSIS — E785 Hyperlipidemia, unspecified: Secondary | ICD-10-CM | POA: Diagnosis not present

## 2014-10-28 LAB — COMPREHENSIVE METABOLIC PANEL
ALT: 25 U/L (ref 0–35)
AST: 22 U/L (ref 0–37)
Albumin: 4.1 g/dL (ref 3.5–5.2)
Alkaline Phosphatase: 62 U/L (ref 39–117)
BILIRUBIN TOTAL: 0.6 mg/dL (ref 0.2–1.2)
BUN: 14 mg/dL (ref 6–23)
CO2: 23 mEq/L (ref 19–32)
Calcium: 9.6 mg/dL (ref 8.4–10.5)
Chloride: 105 mEq/L (ref 96–112)
Creat: 0.77 mg/dL (ref 0.50–1.10)
GLUCOSE: 112 mg/dL — AB (ref 70–99)
Potassium: 4.5 mEq/L (ref 3.5–5.3)
Sodium: 140 mEq/L (ref 135–145)
Total Protein: 6.8 g/dL (ref 6.0–8.3)

## 2014-10-28 LAB — CBC WITH DIFFERENTIAL/PLATELET
Basophils Absolute: 0 10*3/uL (ref 0.0–0.1)
Basophils Relative: 0 % (ref 0–1)
EOS ABS: 0.2 10*3/uL (ref 0.0–0.7)
EOS PCT: 3 % (ref 0–5)
HEMATOCRIT: 41.1 % (ref 36.0–46.0)
HEMOGLOBIN: 13.6 g/dL (ref 12.0–15.0)
LYMPHS ABS: 1.3 10*3/uL (ref 0.7–4.0)
Lymphocytes Relative: 19 % (ref 12–46)
MCH: 28.6 pg (ref 26.0–34.0)
MCHC: 33.1 g/dL (ref 30.0–36.0)
MCV: 86.5 fL (ref 78.0–100.0)
MONOS PCT: 12 % (ref 3–12)
MPV: 9.5 fL (ref 8.6–12.4)
Monocytes Absolute: 0.9 10*3/uL (ref 0.1–1.0)
Neutro Abs: 4.7 10*3/uL (ref 1.7–7.7)
Neutrophils Relative %: 66 % (ref 43–77)
Platelets: 211 10*3/uL (ref 150–400)
RBC: 4.75 MIL/uL (ref 3.87–5.11)
RDW: 14 % (ref 11.5–15.5)
WBC: 7.1 10*3/uL (ref 4.0–10.5)

## 2014-10-28 LAB — LIPID PANEL
CHOL/HDL RATIO: 3.5 ratio
Cholesterol: 151 mg/dL (ref 0–200)
HDL: 43 mg/dL — AB (ref >=46)
LDL Cholesterol: 84 mg/dL (ref 0–99)
Triglycerides: 122 mg/dL (ref <150)
VLDL: 24 mg/dL (ref 0–40)

## 2014-10-28 LAB — HEMOGLOBIN A1C
HEMOGLOBIN A1C: 6.3 % — AB (ref <5.7)
Mean Plasma Glucose: 134 mg/dL — ABNORMAL HIGH (ref <117)

## 2014-10-28 LAB — TSH: TSH: 2.173 u[IU]/mL (ref 0.350–4.500)

## 2014-10-28 MED ORDER — EPINEPHRINE 0.3 MG/0.3ML IJ SOAJ: 0.3000 mg | Freq: Once | INTRAMUSCULAR | Status: DC

## 2014-10-28 MED ORDER — PRAVASTATIN SODIUM 20 MG PO TABS: 20.0000 mg | ORAL_TABLET | Freq: Every day | ORAL | Status: DC

## 2014-10-28 MED ORDER — METOPROLOL SUCCINATE ER 50 MG PO TB24: ORAL_TABLET | ORAL | Status: DC

## 2014-10-28 MED ORDER — ALBUTEROL SULFATE HFA 108 (90 BASE) MCG/ACT IN AERS: 2.0000 | INHALATION_SPRAY | RESPIRATORY_TRACT | Status: DC | PRN

## 2014-10-28 MED ORDER — LOSARTAN POTASSIUM 100 MG PO TABS: 100.0000 mg | ORAL_TABLET | Freq: Every day | ORAL | Status: DC

## 2014-10-28 NOTE — Assessment & Plan Note (Signed)
Recheck cholesterol panel

## 2014-10-28 NOTE — Patient Instructions (Addendum)
Continue current medications 2D Echo to be done Colonoscopy not due until 10/09/16 EKG is normal We will call with lab results F/U 4 MONTHS

## 2014-10-28 NOTE — Assessment & Plan Note (Signed)
BP well controlled , no change to medication

## 2014-10-28 NOTE — Assessment & Plan Note (Signed)
Recheck A1C, some weight loss noted

## 2014-10-28 NOTE — Assessment & Plan Note (Signed)
Plan for repeat bone density in 2017 Continue calcium and vitamin D Has been on Evista  And Fosamax in the past

## 2014-10-28 NOTE — Progress Notes (Signed)
Patient ID: Cathy [name], female   DOB: [birth year], 69 y.o.   MRN: [id number] Subjective:   Patient presents for Medicare Annual/Subsequent preventive examination.  Patient here for annual wellness exam and to follow-up prediabetes and hypertension. This past weekend she had a couple episodes of dizziness only lasts a couple seconds. She stated times it will be when she moved her head but she was also a little congested and had to take some allergy medicine. Her symptoms are now resolved she did use some over-the-counter M Amin. With regards to her arthritis in her knee she's done well since the steroid injection she was out in her garden and twisted it but use her knee brace and some Aspercreme and she was back to her baseline.  Her blood pressure at home is been in the 008 systolic over 67Y to 19J diastolic. For her prediabetes her blood sugars fasting are typically less than 120. Review Past Medical/Family/Social: Per EMR   Risk Factors  Current exercise habits: walks some Dietary issues discussed: yes  Cardiac risk factors: Obesity (BMI >= 30 kg/m2), DM, HTN  Depression Screen  (Note: if answer to either of the following is "Yes", a more complete depression screening is indicated)  Over the past two weeks, have you felt down, depressed or hopeless? No Over the past two weeks, have you felt little interest or pleasure in doing things? No Have you lost interest or pleasure in daily life? No Do you often feel hopeless? No Do you cry easily over simple problems? No   Activities of Daily Living  In your present state of health, do you have any difficulty performing the following activities?:  Driving? No  Managing money? No  Feeding yourself? No  Getting from bed to chair? No  Climbing a flight of stairs? No  Preparing food and eating?: No  Bathing or showering? No  Getting dressed: No  Getting to the toilet? No  Using the toilet:No  Moving around from place to place: No  In the past  year have you fallen or had a near fall?:No  Are you sexually active? No  Do you have more than one partner? No   Hearing Difficulties: No  Do you often ask people to speak up or repeat themselves? No  Do you experience ringing or noises in your ears? No Do you have difficulty understanding soft or whispered voices? No  Do you feel that you have a problem with memory? No Do you often misplace items? No  Do you feel safe at home? Yes  Cognitive Testing  Alert? Yes Normal Appearance?Yes  Oriented to person? Yes Place? Yes  Time? Yes  Recall of three objects? Yes  Can perform simple calculations? Yes  Displays appropriate judgment?Yes  Can read the correct time from a watch face?Yes   List the Names of Other Physician/Practitioners you currently use: Oncology  Screening Tests / Date - All UTD Colonoscopy                     Zostavax  Mammogram  Influenza Vaccine  Tetanus/tdap  ROS: GEN- denies fatigue, fever, weight loss,weakness, recent illness HEENT- denies eye drainage, change in vision, nasal discharge, CVS- denies chest pain, palpitations RESP- denies SOB, cough, wheeze ABD- denies N/V, change in stools, abd pain GU- denies dysuria, hematuria, dribbling, incontinence MSK- + joint pain, muscle aches, injury Neuro- denies headache, +dizziness, syncope, seizure activity  Physical: GEN- NAD, alert and oriented x3 HEENT- PERRL, EOMI, non  injected sclera, pink conjunctiva, MMM, oropharynx clear, TM clear no effusion, canals clear, nares clear Neck- Supple, no thryomegaly CVS- RRR, 2/6 SEM best right sternal border RESP-CTAB ABD-NABS,soft,NT,ND Neuro-CNII-XII intact, no deficits EXT- No edema Pulses- Radial, DP- 2+   EKG - NSR, no ST changes   Assessment:    Annual wellness medicare exam   Plan:    During the course of the visit the patient was educated and counseled about appropriate screening and preventive services including:   Preventative care UTD  With  regards to heart murmur- concern for possible aortic sclerosis based on exam or possible mitral regurgitation, no systemic symptoms of CHF, strong family history of heart disease. Will obtain 2D Echo to evalute EF and valves. Fasting labs today, TSH will also be done    Diet review for nutrition referral? Yes ____ Not Indicated __x__  Patient Instructions (the written plan) was given to the patient.  Medicare Attestation  I have personally reviewed:  The patient's medical and social history  Their use of alcohol, tobacco or illicit drugs  Their current medications and supplements  The patient's functional ability including ADLs,fall risks, home safety risks, cognitive, and hearing and visual impairment  Diet and physical activities  Evidence for depression or mood disorders  The patient's weight, height, BMI, and visual acuity have been recorded in the chart. I have made referrals, counseling, and provided education to the patient based on review of the above and I have provided the patient with a written personalized care plan for preventive services.

## 2014-10-28 NOTE — Assessment & Plan Note (Signed)
Mild positional veritgo this past weekend, used dramamine , now clear Also had to take zyrtec ? Allergy related as well

## 2014-10-29 ENCOUNTER — Telehealth: Payer: Self-pay | Admitting: *Deleted

## 2014-10-29 LAB — VITAMIN D 25 HYDROXY (VIT D DEFICIENCY, FRACTURES): Vit D, 25-Hydroxy: 56 ng/mL (ref 30–100)

## 2014-10-29 NOTE — Telephone Encounter (Signed)
Submitted referral thru [facility] Medicare for authorization for 2D echo at [facility] with authorization number [id number]  Valid 10/29/14-12/13/14   Dx: Cardiac Murmur, Unspecified  Copy has been faxed to [facility] for review.

## 2014-10-30 ENCOUNTER — Encounter: Payer: Self-pay | Admitting: Family Medicine

## 2014-11-03 ENCOUNTER — Other Ambulatory Visit: Payer: Self-pay

## 2014-11-03 ENCOUNTER — Ambulatory Visit (HOSPITAL_COMMUNITY): Payer: Medicare Other | Attending: Cardiovascular Disease

## 2014-11-03 DIAGNOSIS — I10 Essential (primary) hypertension: Secondary | ICD-10-CM | POA: Diagnosis not present

## 2014-11-03 DIAGNOSIS — R011 Cardiac murmur, unspecified: Secondary | ICD-10-CM | POA: Diagnosis not present

## 2014-11-18 ENCOUNTER — Encounter: Payer: Self-pay | Admitting: Family Medicine

## 2015-02-25 ENCOUNTER — Other Ambulatory Visit: Payer: Self-pay | Admitting: Family Medicine

## 2015-02-25 DIAGNOSIS — R590 Localized enlarged lymph nodes: Secondary | ICD-10-CM

## 2015-02-25 NOTE — Progress Notes (Signed)
Pt called, has appt in 2 weeks, but I will be on vacation,s he has noticed some enlarged lymph nodes and swelling where she previously had her biopsy site done her lymphadenopathy has been followed by oncology for the past few years. She also notices a hard knot in the area. She denies any pain. We'll obtain CT of chest and neck there is been concerned that she may have underlying lymphoma

## 2015-03-03 ENCOUNTER — Ambulatory Visit: Payer: Medicare Other | Admitting: Family Medicine

## 2015-03-05 ENCOUNTER — Other Ambulatory Visit: Payer: Self-pay | Admitting: Family Medicine

## 2015-03-05 ENCOUNTER — Other Ambulatory Visit: Payer: Medicare Other

## 2015-03-05 DIAGNOSIS — R590 Localized enlarged lymph nodes: Secondary | ICD-10-CM

## 2015-03-05 DIAGNOSIS — Z0000 Encounter for general adult medical examination without abnormal findings: Secondary | ICD-10-CM

## 2015-03-05 DIAGNOSIS — I10 Essential (primary) hypertension: Secondary | ICD-10-CM

## 2015-03-06 LAB — COMPLETE METABOLIC PANEL WITH GFR
ALT: 29 U/L (ref 6–29)
AST: 33 U/L (ref 10–35)
Albumin: 4.5 g/dL (ref 3.6–5.1)
Alkaline Phosphatase: 64 U/L (ref 33–130)
BILIRUBIN TOTAL: 0.7 mg/dL (ref 0.2–1.2)
BUN: 13 mg/dL (ref 7–25)
CHLORIDE: 103 mmol/L (ref 98–110)
CO2: 28 mmol/L (ref 20–31)
Calcium: 10.3 mg/dL (ref 8.6–10.4)
Creat: 0.73 mg/dL (ref 0.50–0.99)
GFR, EST NON AFRICAN AMERICAN: 85 mL/min (ref >=60)
Glucose, Bld: 104 mg/dL — ABNORMAL HIGH (ref 70–99)
POTASSIUM: 4.9 mmol/L (ref 3.5–5.3)
Sodium: 139 mmol/L (ref 135–146)
TOTAL PROTEIN: 7.3 g/dL (ref 6.1–8.1)

## 2015-03-06 LAB — LIPID PANEL
CHOL/HDL RATIO: 4.8 ratio (ref <=5.0)
Cholesterol: 159 mg/dL (ref 125–200)
HDL: 33 mg/dL — ABNORMAL LOW (ref >=46)
LDL CALC: 84 mg/dL (ref <130)
Triglycerides: 208 mg/dL — ABNORMAL HIGH (ref <150)
VLDL: 42 mg/dL — AB (ref <30)

## 2015-03-06 LAB — HEMOGLOBIN A1C
HEMOGLOBIN A1C: 6.2 % — AB (ref <5.7)
MEAN PLASMA GLUCOSE: 131 mg/dL — AB (ref <117)

## 2015-03-09 ENCOUNTER — Encounter (HOSPITAL_COMMUNITY): Payer: Self-pay

## 2015-03-09 ENCOUNTER — Ambulatory Visit (HOSPITAL_COMMUNITY)
Admission: RE | Admit: 2015-03-09 | Discharge: 2015-03-09 | Disposition: A | Discharge status: Home / Self Care | Payer: Medicare Other | Source: Ambulatory Visit | Attending: Family Medicine | Admitting: Family Medicine

## 2015-03-09 DIAGNOSIS — R591 Generalized enlarged lymph nodes: Secondary | ICD-10-CM | POA: Insufficient documentation

## 2015-03-09 DIAGNOSIS — R590 Localized enlarged lymph nodes: Secondary | ICD-10-CM

## 2015-03-09 MED ORDER — IOHEXOL 300 MG/ML  SOLN
75.0000 mL | Freq: Once | INTRAMUSCULAR | Status: AC | PRN
  Administered 2015-03-09: 100 mL via INTRAVENOUS

## 2015-03-10 ENCOUNTER — Ambulatory Visit (INDEPENDENT_AMBULATORY_CARE_PROVIDER_SITE_OTHER): Payer: Medicare Other | Admitting: Family Medicine

## 2015-03-10 ENCOUNTER — Encounter: Payer: Self-pay | Admitting: Family Medicine

## 2015-03-10 VITALS — BP 136/72 | HR 84 | Temp 98.2°F | Resp 12 | Ht 62.0 in | Wt 176.0 lb

## 2015-03-10 DIAGNOSIS — E669 Obesity, unspecified: Secondary | ICD-10-CM

## 2015-03-10 DIAGNOSIS — G4700 Insomnia, unspecified: Secondary | ICD-10-CM

## 2015-03-10 DIAGNOSIS — Z23 Encounter for immunization: Secondary | ICD-10-CM

## 2015-03-10 DIAGNOSIS — R7309 Other abnormal glucose: Secondary | ICD-10-CM | POA: Diagnosis not present

## 2015-03-10 DIAGNOSIS — M170 Bilateral primary osteoarthritis of knee: Secondary | ICD-10-CM

## 2015-03-10 DIAGNOSIS — I10 Essential (primary) hypertension: Secondary | ICD-10-CM

## 2015-03-10 DIAGNOSIS — E785 Hyperlipidemia, unspecified: Secondary | ICD-10-CM | POA: Diagnosis not present

## 2015-03-10 DIAGNOSIS — R590 Localized enlarged lymph nodes: Secondary | ICD-10-CM

## 2015-03-10 DIAGNOSIS — R599 Enlarged lymph nodes, unspecified: Secondary | ICD-10-CM

## 2015-03-10 DIAGNOSIS — R7303 Prediabetes: Secondary | ICD-10-CM

## 2015-03-10 LAB — CBC WITH DIFFERENTIAL/PLATELET
BASOS PCT: 1 % (ref 0–1)
Basophils Absolute: 0.1 10*3/uL (ref 0.0–0.1)
EOS ABS: 0.3 10*3/uL (ref 0.0–0.7)
EOS PCT: 4 % (ref 0–5)
HCT: 39.5 % (ref 36.0–46.0)
Hemoglobin: 13.1 g/dL (ref 12.0–15.0)
Lymphocytes Relative: 19 % (ref 12–46)
Lymphs Abs: 1.5 10*3/uL (ref 0.7–4.0)
MCH: 29.5 pg (ref 26.0–34.0)
MCHC: 33.2 g/dL (ref 30.0–36.0)
MCV: 89 fL (ref 78.0–100.0)
MONOS PCT: 12 % (ref 3–12)
MPV: 9.2 fL (ref 8.6–12.4)
Monocytes Absolute: 0.9 10*3/uL (ref 0.1–1.0)
NEUTROS PCT: 64 % (ref 43–77)
Neutro Abs: 5.1 10*3/uL (ref 1.7–7.7)
PLATELETS: 224 10*3/uL (ref 150–400)
RBC: 4.44 MIL/uL (ref 3.87–5.11)
RDW: 13.8 % (ref 11.5–15.5)
WBC: 7.9 10*3/uL (ref 4.0–10.5)

## 2015-03-10 NOTE — Assessment & Plan Note (Signed)
A1C improved, continue to work on dietary changes

## 2015-03-10 NOTE — Assessment & Plan Note (Signed)
Trial of Melatonin over the counter

## 2015-03-10 NOTE — Assessment & Plan Note (Signed)
Nodes are stable, no further intervention at this time

## 2015-03-10 NOTE — Assessment & Plan Note (Signed)
Well controlled 

## 2015-03-10 NOTE — Patient Instructions (Signed)
Flu shot given Try the Melatonin over the counter start with 1mg  We will call with CBC  Work on diet- Avoid fried foods, increase water F/U 4 months

## 2015-03-10 NOTE — Assessment & Plan Note (Signed)
Knee injection given, discussed she will need to see ortho within the year, multiple steroid injections can weaken the ligaments, she wants to hold off until next year Has ultram but does not use

## 2015-03-10 NOTE — Progress Notes (Signed)
Patient ID: Cathy [name], female   DOB: [birth year], 69 y.o.   MRN: [id number]   Subjective:    Patient ID: Cathy [name], female    DOB: 11-30-1945, 69 y.o.   MRN: [id number]  Patient presents for 3 month F/U; L Knee Pain; and Immunizations   Patient here to follow-up medical problems. She will like to have another sterile injection done for her left knee she has known severe osteoarthritis once hold off on orthopedics at this time. She states the knee does give out on her at times it feels weak. She does use ibuprofen or Aleve. She also used 1 pain pill because she had severe pain a few weeks ago and that helped resolve the pain.  Her fasting labs were reviewed she is borderline diabetic or weight is actually up 7 pounds from her last visit. She is needing a lot of fried foods and eating out a lot. Her triglycerides were elevated at 208 however LDL total cholesterol was normal. HDL has also decreased.   There was concern for some increased supraclavicular swelling where she has had known lymphadenopathy being followed by oncology. CT of neck and chest were done and showed that the nodes overall were stable some and actually decreased in size. CBC was not done on her routine labs therefore this will be done today.    Review Of Systems:  GEN- denies fatigue, fever, weight loss,weakness, recent illness HEENT- denies eye drainage, change in vision, nasal discharge, CVS- denies chest pain, palpitations RESP- denies SOB, cough, wheeze ABD- denies N/V, change in stools, abd pain GU- denies dysuria, hematuria, dribbling, incontinence MSK- + joint pain, muscle aches, injury Neuro- denies headache, dizziness, syncope, seizure activity       Objective:    BP 136/72 mmHg  Pulse 84  Temp(Src) 98.2 F (36.8 C) (Oral)  Resp 12  Ht 5\' 2"  (1.575 m)  Wt 176 lb (79.833 kg)  BMI 32.18 kg/m2 GEN- NAD, alert and oriented x3 HEENT- PERRL, EOMI, non injected sclera, pink conjunctiva, MMM, oropharynx  clear Neck- Supple, no thyromegaly, left supraclavicular swelling noted, NT  CVS- RRR, 2/6 SEM RESP-CTAB ABD-NABS,soft,NT,ND EXT- No edema Pulses- Radial, DP- 2+ MSK- Left knee- No effusion, no warmth, decreased ROM, +crepitus, ligaments in tact   Procedure- Left Knee injection Procedure explained to patient questions answered benefits and risks discussed verbal consent obtained. Antiseptic-Betadine Injection- Kenalog 40mg  1cc, Lidocaine 1% 2cc, Marcaine 0.5% 2cc Minimal blood loss Patient tolerated procedure well Bandage applied       Assessment & Plan:      Problem List Items Addressed This Visit    Pre-diabetes - Primary    A1C improved, continue to work on dietary changes      Obesity   OA (osteoarthritis) of knee    Knee injection given, discussed she will need to see ortho within the year, multiple steroid injections can weaken the ligaments, she wants to hold off until next year Has ultram but does not use      Lymphadenopathy of left cervical region    Nodes are stable, no further intervention at this time      Insomnia    Trial of Melatonin over the counter      Hypertension    Well controlled      Relevant Orders   CBC with Differential/Platelet   Hyperlipidemia    Discussed healthy eating and change in diet, avoid fried foods, eating out, TG increasing as well as weight No change  to pravastatin LDL at goal      Hilar lymphadenopathy    Other Visit Diagnoses    Need for prophylactic vaccination and inoculation against influenza        Relevant Orders    Flu Vaccine QUAD 36+ mos PF IM (Fluarix & Fluzone Quad PF) (Completed)       Note: This dictation was prepared with Dragon dictation along with smaller phrase technology. Any transcriptional errors that result from this process are unintentional.

## 2015-03-10 NOTE — Assessment & Plan Note (Signed)
Discussed healthy eating and change in diet, avoid fried foods, eating out, TG increasing as well as weight No change to pravastatin LDL at goal

## 2015-04-20 ENCOUNTER — Other Ambulatory Visit: Payer: Self-pay

## 2015-04-20 DIAGNOSIS — Z1231 Encounter for screening mammogram for malignant neoplasm of breast: Secondary | ICD-10-CM

## 2015-06-01 ENCOUNTER — Ambulatory Visit: Payer: Medicare Other

## 2015-06-23 ENCOUNTER — Ambulatory Visit: Payer: Medicare Other

## 2015-06-26 ENCOUNTER — Other Ambulatory Visit: Payer: Self-pay | Admitting: Family Medicine

## 2015-06-26 NOTE — Telephone Encounter (Signed)
LRF 03/04/15 #45.  LOV 03/10/15  OK refill Tramadol?

## 2015-06-26 NOTE — Telephone Encounter (Addendum)
I already called this in this morning.  Trying to reach pharmacy-busy  Did finally reach pharmacy and they did have Tramadol RX that was called in earlier

## 2015-06-26 NOTE — Telephone Encounter (Signed)
Medication refilled per protocol. 

## 2015-06-26 NOTE — Telephone Encounter (Signed)
Okay to refill? 

## 2015-06-30 ENCOUNTER — Ambulatory Visit
Admission: RE | Admit: 2015-06-30 | Discharge: 2015-06-30 | Disposition: A | Discharge status: Home / Self Care | Payer: Medicare Other | Source: Ambulatory Visit

## 2015-06-30 DIAGNOSIS — Z1231 Encounter for screening mammogram for malignant neoplasm of breast: Secondary | ICD-10-CM

## 2015-07-13 ENCOUNTER — Ambulatory Visit (INDEPENDENT_AMBULATORY_CARE_PROVIDER_SITE_OTHER): Payer: Medicare Other | Admitting: Family Medicine

## 2015-07-13 ENCOUNTER — Encounter: Payer: Self-pay | Admitting: Family Medicine

## 2015-07-13 VITALS — BP 154/84 | HR 76 | Temp 98.3°F | Resp 14 | Ht 62.0 in | Wt 168.0 lb

## 2015-07-13 DIAGNOSIS — R7303 Prediabetes: Secondary | ICD-10-CM

## 2015-07-13 DIAGNOSIS — I10 Essential (primary) hypertension: Secondary | ICD-10-CM

## 2015-07-13 DIAGNOSIS — M170 Bilateral primary osteoarthritis of knee: Secondary | ICD-10-CM | POA: Diagnosis not present

## 2015-07-13 LAB — CBC WITH DIFFERENTIAL/PLATELET
Basophils Absolute: 0 10*3/uL (ref 0.0–0.1)
Basophils Relative: 0 % (ref 0–1)
EOS ABS: 0.2 10*3/uL (ref 0.0–0.7)
Eosinophils Relative: 2 % (ref 0–5)
HCT: 43.8 % (ref 36.0–46.0)
HEMOGLOBIN: 14.5 g/dL (ref 12.0–15.0)
LYMPHS ABS: 1.6 10*3/uL (ref 0.7–4.0)
Lymphocytes Relative: 21 % (ref 12–46)
MCH: 29.1 pg (ref 26.0–34.0)
MCHC: 33.1 g/dL (ref 30.0–36.0)
MCV: 88 fL (ref 78.0–100.0)
MONOS PCT: 11 % (ref 3–12)
MPV: 9.4 fL (ref 8.6–12.4)
Monocytes Absolute: 0.8 10*3/uL (ref 0.1–1.0)
NEUTROS PCT: 66 % (ref 43–77)
Neutro Abs: 5.1 10*3/uL (ref 1.7–7.7)
PLATELETS: 264 10*3/uL (ref 150–400)
RBC: 4.98 MIL/uL (ref 3.87–5.11)
RDW: 13.8 % (ref 11.5–15.5)
WBC: 7.7 10*3/uL (ref 4.0–10.5)

## 2015-07-13 LAB — COMPREHENSIVE METABOLIC PANEL
ALBUMIN: 4.6 g/dL (ref 3.6–5.1)
ALK PHOS: 63 U/L (ref 33–130)
ALT: 25 U/L (ref 6–29)
AST: 28 U/L (ref 10–35)
BILIRUBIN TOTAL: 0.7 mg/dL (ref 0.2–1.2)
BUN: 13 mg/dL (ref 7–25)
CHLORIDE: 102 mmol/L (ref 98–110)
CO2: 28 mmol/L (ref 20–31)
Calcium: 10.6 mg/dL — ABNORMAL HIGH (ref 8.6–10.4)
Creat: 0.72 mg/dL (ref 0.50–0.99)
Glucose, Bld: 125 mg/dL — ABNORMAL HIGH (ref 70–99)
POTASSIUM: 4.6 mmol/L (ref 3.5–5.3)
Sodium: 140 mmol/L (ref 135–146)
TOTAL PROTEIN: 7.4 g/dL (ref 6.1–8.1)

## 2015-07-13 LAB — LIPID PANEL
CHOL/HDL RATIO: 4.7 ratio (ref <=5.0)
Cholesterol: 141 mg/dL (ref 125–200)
HDL: 30 mg/dL — AB (ref >=46)
LDL Cholesterol: 81 mg/dL (ref <130)
TRIGLYCERIDES: 152 mg/dL — AB (ref <150)
VLDL: 30 mg/dL (ref <30)

## 2015-07-13 LAB — HEMOGLOBIN A1C
Hgb A1c MFr Bld: 6.1 % — ABNORMAL HIGH (ref <5.7)
MEAN PLASMA GLUCOSE: 128 mg/dL — AB (ref <117)

## 2015-07-13 NOTE — Progress Notes (Signed)
Patient ID: Cathy [name], female   DOB: December 15, 1945, 70 y.o.   MRN: [id number]    Subjective:    Patient ID: Cathy [name], female    DOB: 1946/01/02, 70 y.o.   MRN: [id number]  Patient presents for 4 month F/u and L Knee Pain  patient here to follow-up.  she will like to proceed with orthopedic referral for her osteoarthritis of her left knee. Her knee continues to give her difficulties. She is using tramadol as needed for pain the knee does give out on her at times. She does do a lot of bending and stooping as she watches her granddaughter. I have given her a couple of steroid injections which have helped but only last a few months in a time.    she is borderline diabetic last A1c 6.2% she has decreased her weight about 7 pounds since her last visit intentionally.   Hypertension she is taking her losartan as prescribed typically her blood pressure is well controlled today's blood pressure was little elevated. She denies any chest pain or shortness of breath. Next  She has follow-up with oncology in March for her lymphadenopathy    Review Of Systems:  GEN- denies fatigue, fever, weight loss,weakness, recent illness HEENT- denies eye drainage, change in vision, nasal discharge, CVS- denies chest pain, palpitations RESP- denies SOB, cough, wheeze ABD- denies N/V, change in stools, abd pain GU- denies dysuria, hematuria, dribbling, incontinence MSK- denies joint pain, muscle aches, injury Neuro- denies headache, dizziness, syncope, seizure activity       Objective:    BP 154/84 mmHg  Pulse 76  Temp(Src) 98.3 F (36.8 C) (Oral)  Resp 14  Ht 5\' 2"  (1.575 m)  Wt 168 lb (76.204 kg)  BMI 30.72 kg/m2 GEN- NAD, alert and oriented x3 HEENT- PERRL, EOMI, non injected sclera, pink conjunctiva, MMM, oropharynx clear Neck- Supple, no thyromegaly, left supraclavicular swelling noted, NT  CVS- RRR, 2/6 SEM RESP-CTAB ABD-NABS,soft,NT,ND EXT- No edema Pulses- Radial, DP- 2+        Assessment & Plan:      Problem List Items Addressed This Visit    Pre-diabetes   Relevant Orders   Hemoglobin A1c   OA (osteoarthritis) of knee    Referral to Dr. Ricki Rodriguez      Insomnia   Hypertension - Primary   Relevant Orders   CBC with Differential/Platelet   Comprehensive metabolic panel   Lipid panel      Note: This dictation was prepared with Dragon dictation along with smaller phrase technology. Any transcriptional errors that result from this process are unintentional.

## 2015-07-13 NOTE — Assessment & Plan Note (Addendum)
Referral to Dr. Ricki Rodriguez Continue tramadol

## 2015-07-13 NOTE — Patient Instructions (Addendum)
We will call with lab results Referral to ortho - Dr. Ricki Rodriguez F/U end of May for Physical

## 2015-07-14 ENCOUNTER — Encounter: Payer: Self-pay | Admitting: Family Medicine

## 2015-07-14 NOTE — Assessment & Plan Note (Signed)
She has lost some weight which is an improvement with regards her risk factors. Including her borderline diabetes. Recheck her levels today along with her cholesterol previously her triglycerides were elevated

## 2015-07-14 NOTE — Assessment & Plan Note (Signed)
Blood pressure is a little elevated today. She is typically well controlled. She is on losartan and Toprol. I'll have her check her blood pressure at home to see if this was just a random event here in the office. No change to blood pressure medication today Fasting labs were completed

## 2015-08-17 ENCOUNTER — Other Ambulatory Visit (HOSPITAL_BASED_OUTPATIENT_CLINIC_OR_DEPARTMENT_OTHER): Payer: Medicare Other

## 2015-08-17 ENCOUNTER — Other Ambulatory Visit: Payer: Self-pay | Admitting: Hematology and Oncology

## 2015-08-17 ENCOUNTER — Telehealth: Payer: Self-pay | Admitting: Hematology and Oncology

## 2015-08-17 ENCOUNTER — Ambulatory Visit (HOSPITAL_BASED_OUTPATIENT_CLINIC_OR_DEPARTMENT_OTHER): Payer: Medicare Other | Admitting: Hematology and Oncology

## 2015-08-17 ENCOUNTER — Encounter: Payer: Self-pay | Admitting: Hematology and Oncology

## 2015-08-17 VITALS — BP 169/77 | HR 79 | Temp 98.2°F | Resp 18 | Wt 168.9 lb

## 2015-08-17 DIAGNOSIS — R590 Localized enlarged lymph nodes: Secondary | ICD-10-CM

## 2015-08-17 DIAGNOSIS — I10 Essential (primary) hypertension: Secondary | ICD-10-CM

## 2015-08-17 DIAGNOSIS — D729 Disorder of white blood cells, unspecified: Secondary | ICD-10-CM | POA: Diagnosis not present

## 2015-08-17 DIAGNOSIS — R599 Enlarged lymph nodes, unspecified: Secondary | ICD-10-CM

## 2015-08-17 LAB — COMPREHENSIVE METABOLIC PANEL
ALT: 25 U/L (ref 0–55)
ANION GAP: 12 meq/L — AB (ref 3–11)
AST: 24 U/L (ref 5–34)
Albumin: 4.1 g/dL (ref 3.5–5.0)
Alkaline Phosphatase: 68 U/L (ref 40–150)
BILIRUBIN TOTAL: 0.68 mg/dL (ref 0.20–1.20)
BUN: 18.4 mg/dL (ref 7.0–26.0)
CALCIUM: 9.8 mg/dL (ref 8.4–10.4)
CHLORIDE: 105 meq/L (ref 98–109)
CO2: 24 meq/L (ref 22–29)
CREATININE: 0.9 mg/dL (ref 0.6–1.1)
EGFR: 70 mL/min/{1.73_m2} — ABNORMAL LOW (ref >90)
Glucose: 147 mg/dl — ABNORMAL HIGH (ref 70–140)
Potassium: 4.3 mEq/L (ref 3.5–5.1)
Sodium: 142 mEq/L (ref 136–145)
TOTAL PROTEIN: 7.6 g/dL (ref 6.4–8.3)

## 2015-08-17 LAB — CBC WITH DIFFERENTIAL/PLATELET
BASO%: 0.1 % (ref 0.0–2.0)
Basophils Absolute: 0 10*3/uL (ref 0.0–0.1)
EOS%: 0.1 % (ref 0.0–7.0)
Eosinophils Absolute: 0 10*3/uL (ref 0.0–0.5)
HEMATOCRIT: 43.8 % (ref 34.8–46.6)
HGB: 14.3 g/dL (ref 11.6–15.9)
LYMPH%: 6.2 % — AB (ref 14.0–49.7)
MCH: 28.9 pg (ref 25.1–34.0)
MCHC: 32.6 g/dL (ref 31.5–36.0)
MCV: 88.7 fL (ref 79.5–101.0)
MONO#: 0.6 10*3/uL (ref 0.1–0.9)
MONO%: 3.5 % (ref 0.0–14.0)
NEUT%: 90.1 % — ABNORMAL HIGH (ref 38.4–76.8)
NEUTROS ABS: 14.1 10*3/uL — AB (ref 1.5–6.5)
PLATELETS: 202 10*3/uL (ref 145–400)
RBC: 4.94 10*6/uL (ref 3.70–5.45)
RDW: 13.3 % (ref 11.2–14.5)
WBC: 15.6 10*3/uL — AB (ref 3.9–10.3)
lymph#: 1 10*3/uL (ref 0.9–3.3)

## 2015-08-17 LAB — LACTATE DEHYDROGENASE: LDH: 196 U/L (ref 125–245)

## 2015-08-17 NOTE — Telephone Encounter (Signed)
Gave and printed appt sched and avs for pt for march 2018 °

## 2015-08-18 ENCOUNTER — Telehealth: Payer: Self-pay | Admitting: Family Medicine

## 2015-08-18 DIAGNOSIS — D729 Disorder of white blood cells, unspecified: Secondary | ICD-10-CM | POA: Insufficient documentation

## 2015-08-18 NOTE — Telephone Encounter (Signed)
Call pt I received oncology note and BP found to be very high, what is BP running at home, she should be on losartan and Toprol.

## 2015-08-18 NOTE — Assessment & Plan Note (Signed)
She had recent bronchitis and was placed on prednisone. The neutrophilia is likely related to recent prednisone ingestion. I recommend observation only.

## 2015-08-18 NOTE — Progress Notes (Signed)
[facility] OFFICE PROGRESS NOTE  Patient Care Team: Alycia Rossetti, MD as PCP - General (Family Medicine) Heath Lark, MD as Consulting Physician (Hematology and Oncology)  SUMMARY OF ONCOLOGIC HISTORY:  I reviewed the patient's records extensive and collaborated the history with the patient. 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 She had an incisional biopsy on October 25 which showed atypical lymphoid hyperplasia. Biopsies were sent for second opinion. This appeared to be a T-cell process with increased CD4 positive T lymphocytes but a firm diagnosis of lymphoma could not be established. CT scans of the neck chest abdomen and pelvis showed adenopathy in the left supraclavicular and left axillary regions and a single large right paratracheal lymph node 2 cm in diameter. No abdominal or pelvic adenopathy and no splenomegaly.  She had no constitutional symptoms. Lab testing showed previous exposure to EBV and CMV viruses with elevated IgG but not IgM titers. There was also a significant elevation of IgG against toxoplasmosis. She had no history of exposure to cats or cat litter. She was not anemic. She had a normal white blood count and differential. Serum LDH normal. ESR 4 mm.  I elected to follow her with close observation alone and serial CT scans. She has remained asymptomatic.   INTERVAL HISTORY: Please see below for problem oriented charting. She felt well. Recently, she had bronchitis and was placed on a prednisone taper course. She just completed prednisone this morning. She continues to have persistent lymphadenopathy on the left side of the neck, unchanged. Appetite is stable. Denies recent weight change or night sweats.  REVIEW OF SYSTEMS:   Constitutional: Denies fevers,  chills or abnormal weight loss Eyes: Denies blurriness of vision Ears, nose, mouth, throat, and face: Denies mucositis or sore throat Cardiovascular: Denies palpitation, chest discomfort or lower extremity swelling Gastrointestinal:  Denies nausea, heartburn or change in bowel habits Skin: Denies abnormal skin rashes Neurological:Denies numbness, tingling or new weaknesses Behavioral/Psych: Mood is stable, no new changes  All other systems were reviewed with the patient and are negative.  I have reviewed the past medical history, past surgical history, social history and family history with the patient and they are unchanged from previous note.  ALLERGIES:  is allergic to codeine.  MEDICATIONS:  Current Outpatient Prescriptions  Medication Sig Dispense Refill  . ACCU-CHEK AVIVA PLUS test strip USE TO MONITOR BLOOD SUGAR 3 TIMES A WEEK 50 each 11  . albuterol (PROVENTIL HFA;VENTOLIN HFA) 108 (90 BASE) MCG/ACT inhaler Inhale 2 puffs into the lungs every 4 (four) hours as needed. 1 Inhaler 3  . aspirin 81 MG tablet Take 81 mg by mouth daily.    . calcium carbonate (OS-CAL) 600 MG TABS Take 600 mg by mouth daily.    . cetirizine (ZYRTEC) 10 MG tablet Take 10 mg by mouth daily.    . Cinnamon 500 MG TABS Take 1 tablet by mouth daily.    Marland Kitchen EPINEPHrine 0.3 mg/0.3 mL IJ SOAJ injection Inject 0.3 mLs (0.3 mg total) into the muscle once. 1 Device 2  . glucosamine-chondroitin 500-400 MG tablet Take 1 tablet by mouth 2 (two) times daily.    Marland Kitchen ibuprofen (ADVIL,MOTRIN) 200 MG tablet Take 200 mg by mouth every 6 (six) hours as needed.    Marland Kitchen losartan (COZAAR) 100 MG tablet Take 1 tablet (100 mg total) by mouth  daily. 90 tablet 2  . metoprolol succinate (TOPROL-XL) 50 MG 24 hr tablet TAKE 1 TABLET BY MOUTH ONCE A DAY WITH OR IMMEDIATELY FOLLOWING A MEAL 90 tablet 3  . Multiple Vitamin (MULTIVITAMIN) capsule Take 1 capsule by mouth daily.    . Omega-3 Fatty Acids (FISH OIL) 1000 MG CAPS Take 1 capsule by  mouth daily.    Marland Kitchen OVER THE COUNTER MEDICATION Tumeric once daily    . pravastatin (PRAVACHOL) 20 MG tablet Take 1 tablet (20 mg total) by mouth daily. 90 tablet 2  . ranitidine (ZANTAC) 150 MG tablet TAKE 1 TABLET BY MOUTH ONCE A DAY 90 tablet 3  . tetrahydrozoline-zinc (VISINE-AC) 0.05-0.25 % ophthalmic solution 2 drops 3 (three) times daily as needed.    . traMADol (ULTRAM) 50 MG tablet TAKE 1 TABLET BY MOUTH EVERY 8 HOURS AS NEEDED FOR PAIN (Patient not taking: Reported on 08/17/2015) 45 tablet 0   No current facility-administered medications for this visit.    PHYSICAL EXAMINATION: ECOG PERFORMANCE STATUS: 0 - Asymptomatic  Filed Vitals:   08/17/15 1022  BP: 169/77  Pulse: 79  Temp: 98.2 F (36.8 C)  Resp: 18   Filed Weights   08/17/15 1022  Weight: 168 lb 14.4 oz (76.613 kg)    GENERAL:alert, no distress and comfortable SKIN: skin color, texture, turgor are normal, no rashes or significant lesions EYES: normal, Conjunctiva are pink and non-injected, sclera clear OROPHARYNX:no exudate, no erythema and lips, buccal mucosa, and tongue normal  NECK: supple, thyroid normal size, non-tender, without nodularity LYMPH: She has persistent lymphadenopathy on the left side of the neck.  LUNGS: Mild scattered wheezes audible with normal breathing effort HEART: regular rate & rhythm and no murmurs and no lower extremity edema ABDOMEN:abdomen soft, non-tender and normal bowel sounds Musculoskeletal:no cyanosis of digits and no clubbing  NEURO: alert & oriented x 3 with fluent speech, no focal motor/sensory deficits  LABORATORY DATA:  I have reviewed the data as listed    Component Value Date/Time   NA 142 08/17/2015 1010   NA 140 07/13/2015 0901   K 4.3 08/17/2015 1010   K 4.6 07/13/2015 0901   CL 102 07/13/2015 0901   CL 103 07/16/2012 1304   CO2 24 08/17/2015 1010   CO2 28 07/13/2015 0901   GLUCOSE 147* 08/17/2015 1010   GLUCOSE 125* 07/13/2015 0901   GLUCOSE 112*  07/16/2012 1304   BUN 18.4 08/17/2015 1010   BUN 13 07/13/2015 0901   CREATININE 0.9 08/17/2015 1010   CREATININE 0.72 07/13/2015 0901   CREATININE 0.79 01/21/2014 0753   CALCIUM 9.8 08/17/2015 1010   CALCIUM 10.6* 07/13/2015 0901   PROT 7.6 08/17/2015 1010   PROT 7.4 07/13/2015 0901   ALBUMIN 4.1 08/17/2015 1010   ALBUMIN 4.6 07/13/2015 0901   AST 24 08/17/2015 1010   AST 28 07/13/2015 0901   ALT 25 08/17/2015 1010   ALT 25 07/13/2015 0901   ALKPHOS 68 08/17/2015 1010   ALKPHOS 63 07/13/2015 0901   BILITOT 0.68 08/17/2015 1010   BILITOT 0.7 07/13/2015 0901   GFRNONAA 85 03/05/2015 1601   GFRNONAA >60 04/01/2010 1015   GFRAA >89 03/05/2015 1601   GFRAA  04/01/2010 1015    >60        The eGFR has been calculated using the MDRD equation. This calculation has not been validated in all clinical situations. eGFR's persistently <60 mL/min signify possible Chronic Kidney Disease.    No results found for: SPEP, UPEP  Lab  Results  Component Value Date   WBC 15.6* 08/17/2015   NEUTROABS 14.1* 08/17/2015   HGB 14.3 08/17/2015   HCT 43.8 08/17/2015   MCV 88.7 08/17/2015   PLT 202 08/17/2015      Chemistry      Component Value Date/Time   NA 142 08/17/2015 1010   NA 140 07/13/2015 0901   K 4.3 08/17/2015 1010   K 4.6 07/13/2015 0901   CL 102 07/13/2015 0901   CL 103 07/16/2012 1304   CO2 24 08/17/2015 1010   CO2 28 07/13/2015 0901   BUN 18.4 08/17/2015 1010   BUN 13 07/13/2015 0901   CREATININE 0.9 08/17/2015 1010   CREATININE 0.72 07/13/2015 0901   CREATININE 0.79 01/21/2014 0753      Component Value Date/Time   CALCIUM 9.8 08/17/2015 1010   CALCIUM 10.6* 07/13/2015 0901   ALKPHOS 68 08/17/2015 1010   ALKPHOS 63 07/13/2015 0901   AST 24 08/17/2015 1010   AST 28 07/13/2015 0901   ALT 25 08/17/2015 1010   ALT 25 07/13/2015 0901   BILITOT 0.68 08/17/2015 1010   BILITOT 0.7 07/13/2015 0901      ASSESSMENT & PLAN:  Lymphadenopathy of left cervical  region 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 She is not symptomatic. I recommend history, physical examination and blood work in 1 year and to defer imaging study unless there are signs and symptoms to suggest disease progression. She agreed.   Neutrophilia She had recent bronchitis and was placed on prednisone. The neutrophilia is likely related to recent prednisone ingestion. I recommend observation only.  Essential hypertension she will continue current medical management. Her blood pressure is a little elevated likely related to recent prednisone or anxiety I recommend close follow-up with primary care doctor for medication adjustment.    No orders of the defined types were placed in this encounter.   All questions were answered. The patient knows to call the clinic with any problems, questions or concerns. No barriers to learning was detected. I spent 15 minutes counseling the patient face to face. The total time spent in the appointment was 20 minutes and more than 50% was on counseling and review of test results     [facility], [place], MD 08/18/2015 7:19 AM

## 2015-08-18 NOTE — Telephone Encounter (Signed)
Call placed to patient.   Reports that she has had URI and has been prescribed prednisone and using her albuterol inhaler.   Reports that she is taking HTN meds as directed.   Home BP readings are as follows: 148/78 142/78 132/73 140/71 128/66 159/74 144/76 140/72 134/67 139/75  MD to be made aware.

## 2015-08-18 NOTE — Assessment & Plan Note (Signed)
she will continue current medical management. Her blood pressure is a little elevated likely related to recent prednisone or anxiety I recommend close follow-up with primary care doctor for medication adjustment.

## 2015-08-18 NOTE — Assessment & Plan Note (Signed)
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 She is not symptomatic. I recommend history, physical examination and blood work in 1 year and to defer imaging study unless there are signs and symptoms to suggest disease progression. She agreed.   

## 2015-08-18 NOTE — Telephone Encounter (Signed)
Okay continue to monitor if stays above 140/90, I want her to come in

## 2015-08-18 NOTE — Telephone Encounter (Signed)
Call placed to patient and patient made aware.  

## 2015-10-08 ENCOUNTER — Other Ambulatory Visit: Payer: Self-pay | Admitting: Family Medicine

## 2015-10-08 DIAGNOSIS — H109 Unspecified conjunctivitis: Secondary | ICD-10-CM

## 2015-10-08 MED ORDER — ERYTHROMYCIN 5 MG/GM OP OINT: 1.0000 "application " | TOPICAL_OINTMENT | Freq: Every day | OPHTHALMIC | Status: DC

## 2015-10-08 NOTE — Progress Notes (Signed)
Patient has conjunctivitis of left eye x 48 hrs with small corneal abrasion seen at 1 o'clock on fluroscein exam.

## 2015-10-15 ENCOUNTER — Other Ambulatory Visit: Payer: Self-pay | Admitting: Family Medicine

## 2015-10-15 NOTE — Telephone Encounter (Signed)
Ok to refill??  Last office visit 07/13/2015.  Last refill 06/26/2015.

## 2015-10-16 NOTE — Telephone Encounter (Signed)
Medication called to pharmacy. 

## 2015-10-16 NOTE — Telephone Encounter (Signed)
Okay to refill? 

## 2015-12-21 ENCOUNTER — Other Ambulatory Visit: Payer: Self-pay | Admitting: Family Medicine

## 2015-12-21 NOTE — Telephone Encounter (Signed)
Refill appropriate and filled per protocol. 

## 2015-12-28 ENCOUNTER — Encounter: Payer: Self-pay | Admitting: Family Medicine

## 2015-12-28 ENCOUNTER — Ambulatory Visit (INDEPENDENT_AMBULATORY_CARE_PROVIDER_SITE_OTHER): Payer: Medicare Other | Admitting: Family Medicine

## 2015-12-28 VITALS — BP 138/70 | HR 76 | Temp 98.3°F | Resp 12 | Ht 61.0 in | Wt 176.0 lb

## 2015-12-28 DIAGNOSIS — M170 Bilateral primary osteoarthritis of knee: Secondary | ICD-10-CM

## 2015-12-28 DIAGNOSIS — R7303 Prediabetes: Secondary | ICD-10-CM | POA: Diagnosis not present

## 2015-12-28 DIAGNOSIS — E669 Obesity, unspecified: Secondary | ICD-10-CM

## 2015-12-28 DIAGNOSIS — I10 Essential (primary) hypertension: Secondary | ICD-10-CM | POA: Diagnosis not present

## 2015-12-28 DIAGNOSIS — E785 Hyperlipidemia, unspecified: Secondary | ICD-10-CM | POA: Diagnosis not present

## 2015-12-28 NOTE — Progress Notes (Signed)
Patient ID: Cathy [name], female   DOB: 02/08/1946, 70 y.o.   MRN: [id number]   Subjective:    Patient ID: Cathy [name], female    DOB: April 21, 1946, 70 y.o.   MRN: [id number]  Patient presents for Surgical Clearance- Knee Surgery  Patient here for clearance for  Left TKA mediolateral with patella resurfacing, to be done by Dr. Ricki Rodriguez. History of hypertension hyperlipidemia medications reviewed. She is also prediabetic. She's been taking medications as prescribed. Due for fasting labs today.  She is using tramadol as needed for her knee pain or over-the-counter anti-inflammatories  She also has lymphadenopathy suspicious for low-grade lymphoma that has been followed by oncology but there is no active treatment at this time surveillance  History of asthma which is very intermittent and she uses albuterol as needed but has not required this  Review Of Systems:  GEN- denies fatigue, fever, weight loss,weakness, recent illness HEENT- denies eye drainage, change in vision, nasal discharge, CVS- denies chest pain, palpitations RESP- denies SOB, cough, wheeze ABD- denies N/V, change in stools, abd pain GU- denies dysuria, hematuria, dribbling, incontinence MSK- + joint pain, muscle aches, injury Neuro- denies headache, dizziness, syncope, seizure activity       Objective:    BP 138/70 mmHg  Pulse 76  Temp(Src) 98.3 F (36.8 C) (Oral)  Resp 12  Ht 5\' 1"  (1.549 m)  Wt 176 lb (79.833 kg)  BMI 33.27 kg/m2 GEN- NAD, alert and oriented x3 HEENT- PERRL, EOMI, non injected sclera, pink conjunctiva, MMM, oropharynx clear Neck- Supple, no thyromegaly CVS- RRR, no murmur RESP-CTAB ABD-NABS,soft,NT,ND EXT- No edema Pulses- Radial, DP- 2+   EKG- NSR, no ST changes, poor r wave progression lateral leads- non specific, no signignca change from 2016     Assessment & Plan:      Problem List Items Addressed This Visit    Pre-diabetes    Check fasting labs, discussed dietary changes needed  to avoid developing DM       Relevant Orders   Hemoglobin A1c   Obesity   OA (osteoarthritis) of knee    Cleared for surgical internvetion on knee  Check labs  No change to meds       Hyperlipidemia   Relevant Orders   Lipid panel   Essential hypertension - Primary    BP controlled, no change to meds      Relevant Orders   EKG 12-Lead (Completed)   CBC with Differential/Platelet   Comprehensive metabolic panel   TSH      Note: This dictation was prepared with Dragon dictation along with smaller phrase technology. Any transcriptional errors that result from this process are unintentional.

## 2015-12-28 NOTE — Patient Instructions (Signed)
F/U Dec  for Physical

## 2015-12-28 NOTE — Assessment & Plan Note (Signed)
Check fasting labs, discussed dietary changes needed to avoid developing DM

## 2015-12-28 NOTE — Assessment & Plan Note (Signed)
BP controlled, no change to meds

## 2015-12-28 NOTE — Assessment & Plan Note (Signed)
Cleared for surgical internvetion on knee  Check labs  No change to meds

## 2015-12-29 ENCOUNTER — Other Ambulatory Visit: Payer: Self-pay | Admitting: *Deleted

## 2015-12-29 DIAGNOSIS — E875 Hyperkalemia: Secondary | ICD-10-CM

## 2015-12-29 LAB — COMPREHENSIVE METABOLIC PANEL
ALBUMIN: 4.5 g/dL (ref 3.6–5.1)
ALT: 24 U/L (ref 6–29)
AST: 27 U/L (ref 10–35)
Alkaline Phosphatase: 64 U/L (ref 33–130)
BUN: 15 mg/dL (ref 7–25)
CHLORIDE: 101 mmol/L (ref 98–110)
CO2: 26 mmol/L (ref 20–31)
Calcium: 10.1 mg/dL (ref 8.6–10.4)
Creat: 0.9 mg/dL (ref 0.50–0.99)
Glucose, Bld: 111 mg/dL — ABNORMAL HIGH (ref 70–99)
POTASSIUM: 5.5 mmol/L — AB (ref 3.5–5.3)
SODIUM: 140 mmol/L (ref 135–146)
TOTAL PROTEIN: 7.3 g/dL (ref 6.1–8.1)
Total Bilirubin: 0.7 mg/dL (ref 0.2–1.2)

## 2015-12-29 LAB — CBC WITH DIFFERENTIAL/PLATELET
BASOS ABS: 73 {cells}/uL (ref 0–200)
Basophils Relative: 1 %
EOS ABS: 292 {cells}/uL (ref 15–500)
EOS PCT: 4 %
HCT: 43.8 % (ref 35.0–45.0)
HEMOGLOBIN: 14.1 g/dL (ref 12.0–15.0)
Lymphocytes Relative: 18 %
Lymphs Abs: 1314 cells/uL (ref 850–3900)
MCH: 29.1 pg (ref 27.0–33.0)
MCHC: 32.2 g/dL (ref 32.0–36.0)
MCV: 90.3 fL (ref 80.0–100.0)
MONOS PCT: 11 %
MPV: 10 fL (ref 7.5–12.5)
Monocytes Absolute: 803 cells/uL (ref 200–950)
NEUTROS ABS: 4818 {cells}/uL (ref 1500–7800)
NEUTROS PCT: 66 %
PLATELETS: 268 10*3/uL (ref 140–400)
RBC: 4.85 MIL/uL (ref 3.80–5.10)
RDW: 13.7 % (ref 11.0–15.0)
WBC: 7.3 10*3/uL (ref 3.8–10.8)

## 2015-12-29 LAB — LIPID PANEL
CHOL/HDL RATIO: 4 ratio (ref <=5.0)
Cholesterol: 136 mg/dL (ref 125–200)
HDL: 34 mg/dL — AB (ref >=46)
LDL Cholesterol: 73 mg/dL (ref <130)
TRIGLYCERIDES: 143 mg/dL (ref <150)
VLDL: 29 mg/dL (ref <30)

## 2015-12-29 LAB — HEMOGLOBIN A1C
HEMOGLOBIN A1C: 6.1 % — AB (ref <5.7)
MEAN PLASMA GLUCOSE: 128 mg/dL

## 2015-12-29 LAB — TSH: TSH: 1.51 mIU/L

## 2016-01-04 ENCOUNTER — Other Ambulatory Visit: Payer: Medicare Other

## 2016-01-04 DIAGNOSIS — E875 Hyperkalemia: Secondary | ICD-10-CM

## 2016-01-04 LAB — BASIC METABOLIC PANEL
BUN: 14 mg/dL (ref 7–25)
CO2: 27 mmol/L (ref 20–31)
Calcium: 9.4 mg/dL (ref 8.6–10.4)
Chloride: 103 mmol/L (ref 98–110)
Creat: 0.81 mg/dL (ref 0.50–0.99)
GLUCOSE: 112 mg/dL — AB (ref 70–99)
POTASSIUM: 4.9 mmol/L (ref 3.5–5.3)
Sodium: 139 mmol/L (ref 135–146)

## 2016-02-16 ENCOUNTER — Ambulatory Visit (INDEPENDENT_AMBULATORY_CARE_PROVIDER_SITE_OTHER): Payer: Medicare Other

## 2016-02-16 DIAGNOSIS — Z23 Encounter for immunization: Secondary | ICD-10-CM

## 2016-03-16 ENCOUNTER — Telehealth: Payer: Self-pay | Admitting: *Deleted

## 2016-03-16 MED ORDER — TRAMADOL HCL 50 MG PO TABS: 50.0000 mg | ORAL_TABLET | Freq: Three times a day (TID) | ORAL | 0 refills | Status: DC | PRN

## 2016-03-16 NOTE — Telephone Encounter (Signed)
Received fax requesting refill on Tramadol.   Ok to refill??  Last office visit 12/28/2015.  Last refill  10/16/2015.

## 2016-03-16 NOTE — Telephone Encounter (Signed)
Medication called to pharmacy. 

## 2016-03-16 NOTE — Telephone Encounter (Signed)
okay

## 2016-03-20 ENCOUNTER — Ambulatory Visit: Payer: Self-pay | Admitting: Orthopedic Surgery

## 2016-03-24 ENCOUNTER — Encounter (HOSPITAL_COMMUNITY): Payer: Self-pay

## 2016-03-28 NOTE — Progress Notes (Signed)
12/28/2015- noted in [facility] 12/28/2015- Pre-operative clearance from Dr. Buelah Manis on chart. 11/03/2014- noted in [facility]

## 2016-03-28 NOTE — Patient Instructions (Addendum)
Cathy [name]  03/28/2016   Your procedure is scheduled on: Wednesday 04/06/2016  Report to [facility] Main  Entrance take [place]  elevators to 3rd floor to  [place] at  [place] AM.  Call this number if you have problems the morning of surgery [phone]   Remember: ONLY 1 PERSON MAY GO WITH YOU TO SHORT STAY TO GET  READY MORNING OF [place].   Do not eat food or drink liquids :After Midnight.     Take these medicines the morning of surgery with A SIP OF WATER:  Metoprolol, use Albuterol inhaler if needed                                 You may not have any metal on your body including hair pins and              piercings  Do not wear jewelry, make-up, lotions, powders or perfumes, deodorant             Do not wear nail polish.  Do not shave  48 hours prior to surgery.              Men may shave face and neck.   Do not bring valuables to the hospital. [facility].  Contacts, dentures or bridgework may not be worn into surgery.  Leave suitcase in the car. After surgery it may be brought to your room.                  Please read over the following fact sheets you were given: _____________________________________________________________________             [facility] - Preparing for Surgery Before surgery, you can play an important role.  Because skin is not sterile, your skin needs to be as free of germs as possible.  You can reduce the number of germs on your skin by washing with CHG (chlorahexidine gluconate) soap before surgery.  CHG is an antiseptic cleaner which kills germs and bonds with the skin to continue killing germs even after washing. Please DO NOT use if you have an allergy to CHG or antibacterial soaps.  If your skin becomes reddened/irritated stop using the CHG and inform your nurse when you arrive at Short Stay. Do not shave (including legs and underarms) for at least 48 hours  prior to the first CHG shower.  You may shave your face/neck. Please follow these instructions carefully:  1.  Shower with CHG Soap the night before surgery and the  morning of Surgery.  2.  If you choose to wash your hair, wash your hair first as usual with your  normal  shampoo.  3.  After you shampoo, rinse your hair and body thoroughly to remove the  shampoo.                           4.  Use CHG as you would any other liquid soap.  You can apply chg directly  to the skin and wash                       Gently with a scrungie  or clean washcloth.  5.  Apply the CHG Soap to your body ONLY FROM THE NECK DOWN.   Do not use on face/ open                           Wound or open sores. Avoid contact with eyes, ears mouth and genitals (private parts).                       Wash face,  Genitals (private parts) with your normal soap.             6.  Wash thoroughly, paying special attention to the area where your surgery  will be performed.  7.  Thoroughly rinse your body with warm water from the neck down.  8.  DO NOT shower/wash with your normal soap after using and rinsing off  the CHG Soap.                9.  Pat yourself dry with a clean towel.            10.  Wear clean pajamas.            11.  Place clean sheets on your bed the night of your first shower and do not  sleep with pets. Day of Surgery : Do not apply any lotions/deodorants the morning of surgery.  Please wear clean clothes to the hospital/surgery center.  FAILURE TO FOLLOW THESE INSTRUCTIONS MAY RESULT IN THE CANCELLATION OF YOUR SURGERY PATIENT SIGNATURE_________________________________  NURSE SIGNATURE__________________________________  ________________________________________________________________________   Adam Phenix  An incentive spirometer is a tool that can help keep your lungs clear and active. This tool measures how well you are filling your lungs with each breath. Taking long deep breaths may help reverse  or decrease the chance of developing breathing (pulmonary) problems (especially infection) following:  A long period of time when you are unable to move or be active. BEFORE THE PROCEDURE   If the spirometer includes an indicator to show your best effort, your nurse or respiratory therapist will set it to a desired goal.  If possible, sit up straight or lean slightly forward. Try not to slouch.  Hold the incentive spirometer in an upright position. INSTRUCTIONS FOR USE  1. Sit on the edge of your bed if possible, or sit up as far as you can in bed or on a chair. 2. Hold the incentive spirometer in an upright position. 3. Breathe out normally. 4. Place the mouthpiece in your mouth and seal your lips tightly around it. 5. Breathe in slowly and as deeply as possible, raising the piston or the ball toward the top of the column. 6. Hold your breath for 3-5 seconds or for as long as possible. Allow the piston or ball to fall to the bottom of the column. 7. Remove the mouthpiece from your mouth and breathe out normally. 8. Rest for a few seconds and repeat Steps 1 through 7 at least 10 times every 1-2 hours when you are awake. Take your time and take a few normal breaths between deep breaths. 9. The spirometer may include an indicator to show your best effort. Use the indicator as a goal to work toward during each repetition. 10. After each set of 10 deep breaths, practice coughing to be sure your lungs are clear. If you have an incision (the cut made at the time of surgery), support your incision when  coughing by placing a pillow or rolled up towels firmly against it. Once you are able to get out of bed, walk around indoors and cough well. You may stop using the incentive spirometer when instructed by your caregiver.  RISKS AND COMPLICATIONS  Take your time so you do not get dizzy or light-headed.  If you are in pain, you may need to take or ask for pain medication before doing incentive  spirometry. It is harder to take a deep breath if you are having pain. AFTER USE  Rest and breathe slowly and easily.  It can be helpful to keep track of a log of your progress. Your caregiver can provide you with a simple table to help with this. If you are using the spirometer at home, follow these instructions: [employer] IF:   You are having difficultly using the spirometer.  You have trouble using the spirometer as often as instructed.  Your pain medication is not giving enough relief while using the spirometer.  You develop fever of 100.5 F (38.1 C) or higher. SEEK IMMEDIATE MEDICAL CARE IF:   You cough up bloody sputum that had not been present before.  You develop fever of 102 F (38.9 C) or greater.  You develop worsening pain at or near the incision site. MAKE SURE YOU:   Understand these instructions.  Will watch your condition.  Will get help right away if you are not doing well or get worse. Document Released: 10/10/2006 Document Revised: 08/22/2011 Document Reviewed: 12/11/2006 ExitCare Patient Information 2014 ExitCare, [place].   ________________________________________________________________________  WHAT IS A BLOOD TRANSFUSION? Blood Transfusion Information  A transfusion is the replacement of blood or some of its parts. Blood is made up of multiple cells which provide different functions.  Red blood cells carry oxygen and are used for blood loss replacement.  White blood cells fight against infection.  Platelets control bleeding.  Plasma helps clot blood.  Other blood products are available for specialized needs, such as hemophilia or other clotting disorders. BEFORE THE TRANSFUSION  Who gives blood for transfusions?   Healthy volunteers who are fully evaluated to make sure their blood is safe. This is blood bank blood. Transfusion therapy is the safest it has ever been in the practice of medicine. Before blood is taken from a donor, a  complete history is taken to make sure that person has no history of diseases nor engages in risky social behavior (examples are intravenous drug use or sexual activity with multiple partners). The donor's travel history is screened to minimize risk of transmitting infections, such as malaria. The donated blood is tested for signs of infectious diseases, such as HIV and hepatitis. The blood is then tested to be sure it is compatible with you in order to minimize the chance of a transfusion reaction. If you or a relative donates blood, this is often done in anticipation of surgery and is not appropriate for emergency situations. It takes many days to process the donated blood. RISKS AND COMPLICATIONS Although transfusion therapy is very safe and saves many lives, the main dangers of transfusion include:   Getting an infectious disease.  Developing a transfusion reaction. This is an allergic reaction to something in the blood you were given. Every precaution is taken to prevent this. The decision to have a blood transfusion has been considered carefully by your caregiver before blood is given. Blood is not given unless the benefits outweigh the risks. AFTER THE TRANSFUSION  Right after receiving  a blood transfusion, you will usually feel much better and more energetic. This is especially true if your red blood cells have gotten low (anemic). The transfusion raises the level of the red blood cells which carry oxygen, and this usually causes an energy increase.  The nurse administering the transfusion will monitor you carefully for complications. HOME CARE INSTRUCTIONS  No special instructions are needed after a transfusion. You may find your energy is better. Speak with your caregiver about any limitations on activity for underlying diseases you may have. SEEK MEDICAL CARE IF:   Your condition is not improving after your transfusion.  You develop redness or irritation at the intravenous (IV)  site. SEEK IMMEDIATE MEDICAL CARE IF:  Any of the following symptoms occur over the next 12 hours:  Shaking chills.  You have a temperature by mouth above 102 F (38.9 C), not controlled by medicine.  Chest, back, or muscle pain.  People around you feel you are not acting correctly or are confused.  Shortness of breath or difficulty breathing.  Dizziness and fainting.  You get a rash or develop hives.  You have a decrease in urine output.  Your urine turns a dark color or changes to pink, red, or brown. Any of the following symptoms occur over the next 10 days:  You have a temperature by mouth above 102 F (38.9 C), not controlled by medicine.  Shortness of breath.  Weakness after normal activity.  The white part of the eye turns yellow (jaundice).  You have a decrease in the amount of urine or are urinating less often.  Your urine turns a dark color or changes to pink, red, or brown. Document Released: 05/27/2000 Document Revised: 08/22/2011 Document Reviewed: 01/14/2008 [facility] Patient Information 2014 [place], [place].  _______________________________________________________________________

## 2016-03-29 ENCOUNTER — Encounter (HOSPITAL_COMMUNITY): Payer: Self-pay

## 2016-03-29 ENCOUNTER — Encounter (HOSPITAL_COMMUNITY)
Admission: RE | Admit: 2016-03-29 | Discharge: 2016-03-29 | Disposition: A | Discharge status: Home / Self Care | Payer: Medicare Other | Source: Ambulatory Visit | Attending: Orthopedic Surgery | Admitting: Orthopedic Surgery

## 2016-03-29 DIAGNOSIS — Z0183 Encounter for blood typing: Secondary | ICD-10-CM | POA: Diagnosis not present

## 2016-03-29 DIAGNOSIS — Z01812 Encounter for preprocedural laboratory examination: Secondary | ICD-10-CM | POA: Diagnosis present

## 2016-03-29 HISTORY — DX: Other specified postprocedural states: Z98.890

## 2016-03-29 HISTORY — DX: Prediabetes: R73.03

## 2016-03-29 HISTORY — DX: Other specified postprocedural states: R11.2

## 2016-03-29 LAB — COMPREHENSIVE METABOLIC PANEL
ALBUMIN: 4.5 g/dL (ref 3.5–5.0)
ALT: 26 U/L (ref 14–54)
AST: 30 U/L (ref 15–41)
Alkaline Phosphatase: 54 U/L (ref 38–126)
Anion gap: 6 (ref 5–15)
BUN: 15 mg/dL (ref 6–20)
CHLORIDE: 105 mmol/L (ref 101–111)
CO2: 27 mmol/L (ref 22–32)
CREATININE: 0.73 mg/dL (ref 0.44–1.00)
Calcium: 9.4 mg/dL (ref 8.9–10.3)
GFR calc Af Amer: >60 mL/min (ref >60)
GLUCOSE: 124 mg/dL — AB (ref 65–99)
POTASSIUM: 4.8 mmol/L (ref 3.5–5.1)
SODIUM: 138 mmol/L (ref 135–145)
Total Bilirubin: 0.6 mg/dL (ref 0.3–1.2)
Total Protein: 7.6 g/dL (ref 6.5–8.1)

## 2016-03-29 LAB — URINALYSIS, ROUTINE W REFLEX MICROSCOPIC
BILIRUBIN URINE: NEGATIVE
Glucose, UA: NEGATIVE mg/dL
Hgb urine dipstick: NEGATIVE
KETONES UR: NEGATIVE mg/dL
Leukocytes, UA: NEGATIVE
NITRITE: NEGATIVE
PH: 7 (ref 5.0–8.0)
PROTEIN: NEGATIVE mg/dL
Specific Gravity, Urine: 1.019 (ref 1.005–1.030)

## 2016-03-29 LAB — CBC
HCT: 40.8 % (ref 36.0–46.0)
Hemoglobin: 13.4 g/dL (ref 12.0–15.0)
MCH: 28.6 pg (ref 26.0–34.0)
MCHC: 32.8 g/dL (ref 30.0–36.0)
MCV: 87.2 fL (ref 78.0–100.0)
PLATELETS: 249 10*3/uL (ref 150–400)
RBC: 4.68 MIL/uL (ref 3.87–5.11)
RDW: 13.2 % (ref 11.5–15.5)
WBC: 7.7 10*3/uL (ref 4.0–10.5)

## 2016-03-29 LAB — PROTIME-INR
INR: 0.99
PROTHROMBIN TIME: 13.1 s (ref 11.4–15.2)

## 2016-03-29 LAB — ABO/RH: ABO/RH(D): A POS

## 2016-03-29 LAB — SURGICAL PCR SCREEN
MRSA, PCR: INVALID — AB
STAPHYLOCOCCUS AUREUS: INVALID — AB

## 2016-03-29 LAB — APTT: APTT: 29 s (ref 24–36)

## 2016-03-30 LAB — HEMOGLOBIN A1C
Hgb A1c MFr Bld: 6 % — ABNORMAL HIGH (ref 4.8–5.6)
Mean Plasma Glucose: 126 mg/dL

## 2016-04-01 LAB — MRSA CULTURE: Culture: NOT DETECTED

## 2016-04-03 ENCOUNTER — Ambulatory Visit: Payer: Self-pay | Admitting: Orthopedic Surgery

## 2016-04-03 NOTE — H&P (Signed)
Cathy [name] DOB: May 14, 1946 Widowed / Language: Cleophus Molt / Race: White Female Date of Admission:  04/06/2016 CC:  Left Knee Pain History of Present Illness The patient is a 70 year old female who comes in for a preoperative History and Physical. The patient is scheduled for a left total knee arthroplasty to be performed by Dr. Dione Plover. Aluisio, MD at [facility] on 04-06-2016. The patient is a 70 year old female who presented for follow up of their knee. The patient is being followed for their left knee pain and osteoarthritis. They are now months out from Euflexxa series. Symptoms reported include: pain. The patient feels that they are doing poorly and report their pain level to be mild to moderate (pain varies). The following medication has been used for pain control: antiinflammatory medication and Ultram. The patient has reported improvement of their symptoms with: viscosupplementation (pt. state's the injections helped some, but she is still in a lot of pain). Cathy [name] comes back in for follow up on the gel series. She received a little benefit but not a tremendous amount. She still has pain and especially with certain movements. She was hoping for a better response. She is now at a point where she wants to proceed surgery. She has had several friends tell her that she should go ahead and get the surgery. She has got advanced medial and patellofemoral arthritis in that left knee with significant flexion contracture. No response to cortisone and viscosupplements. At this point, the most predictable means of improving her and function is going to be total knee arthroplasty. We did discuss that in detail today and she would like to go ahead and proceed. They have been treated conservatively in the past for the above stated problem and despite conservative measures, they continue to have progressive pain and severe functional limitations and dysfunction. They have failed non-operative  management including home exercise, medications, and injections. It is felt that they would benefit from undergoing total joint replacement. Risks and benefits of the procedure have been discussed with the patient and they elect to proceed with surgery. There are no active contraindications to surgery such as ongoing infection or rapidly progressive neurological disease.  Problem List/Past Medical Primary osteoarthritis of both knees (M17.0)  Asthma  allergy-induced Gastroesophageal Reflux Disease  High blood pressure  Kidney Stone  Vertigo  Cataract  Hypercholesterolemia  Varicose veins  Hiatal Hernia  Hemorrhoids  Urinary Incontinence  Menopause  Measles  Allergies Codeine Sulfate *ANALGESICS - OPIOID*   Family History  Cancer  Father. Cerebrovascular Accident  Maternal Grandmother, Mother. Congestive Heart Failure  Maternal Grandmother, Mother. Diabetes Mellitus  Paternal Grandfather. First Degree Relatives  reported Heart Disease  Maternal Grandmother, Mother. Heart disease in female family member before age 81  Heart disease in female family member before age 63  Hypertension  Mother. Kidney disease  Father. Osteoarthritis  Mother, Sister. Osteoporosis  Mother, Sister. Rheumatoid Arthritis  Maternal Grandfather.  Social History Children  1 Current work status  retired Furniture conservator/restorer rarely; does running / walking Living situation  live alone Marital status  widowed Never consumed alcohol  09/17/2015: Never consumed alcohol No history of drug/alcohol rehab  Not under pain contract  Number of flights of stairs before winded  1 Tobacco / smoke exposure  09/17/2015: no Tobacco use  Never smoker. 09/17/2015 Advance Directives  Living Will, Healthcare POA  Medication History  Accu-Chek Active (In Vitro) Active. Albuterol (90MCG/ACT Aerosol Soln, Inhalation) Active. Calcium Carbonate (  1500 (600 Ca)MG Tablet, Oral)  Active. ZyrTEC Allergy (10MG  Capsule, Oral) Active. Cinnamon (500MG  Capsule, Oral) Active. EPINEPHrine (0.3MG /0.3ML Soln Auto-inj, Intramuscular) Active. Glucosamine Chondroitin Complx (Oral) Active. Ibuprofen (200MG  Tablet, Oral) Active. Losartan Potassium (100MG  Tablet, Oral) Active. Metoprolol Succinate ER (50MG  Tablet ER 24HR, Oral) Active. Multi Vitamin Daily (Oral) Active. Omega 3 (1000MG  Capsule, Oral) Active. Pravastatin Sodium (20MG  Tablet, Oral) Active. Zantac (150MG  Tablet, Oral) Active. Visine Advanced Relief (0.05-0.1-1-1% Solution, Ophthalmic) Active. TraMADol HCl (50MG  Tablet, Oral) Active.  Past Surgical History Colon Polyp Removal - Colonoscopy  Gallbladder Surgery  laporoscopic Lithotripsy    Review of Systems  General Not Present- Chills, Fatigue, Fever, Memory Loss, Night Sweats, Weight Gain and Weight Loss. Skin Not Present- Eczema, Hives, Itching, Lesions and Rash. HEENT Not Present- Dentures, Double Vision, Headache, Hearing Loss, Tinnitus and Visual Loss. Respiratory Not Present- Allergies, Chronic Cough, Coughing up blood, Shortness of breath at rest and Shortness of breath with exertion. Cardiovascular Not Present- Chest Pain, Difficulty Breathing Lying Down, Murmur, Palpitations, Racing/skipping heartbeats and Swelling. Gastrointestinal Not Present- Abdominal Pain, Bloody Stool, Constipation, Diarrhea, Difficulty Swallowing, Heartburn, Jaundice, Loss of appetitie, Nausea and Vomiting. Female Genitourinary Present- Incontinence, Urinary frequency and Urinating at Night. Not Present- Blood in Urine, Discharge, Flank Pain, Painful Urination, Urgency, Urinary Retention and Weak urinary stream. Musculoskeletal Present- Back Pain, Joint Pain, Joint Swelling, Morning Stiffness, Muscle Pain and Muscle Weakness. Not Present- Spasms. Neurological Not Present- Blackout spells, Difficulty with balance, Dizziness, Paralysis, Tremor and  Weakness. Psychiatric Not Present- Insomnia.  Vitals Weight: 165 lb Height: 62in Weight was reported by patient. Height was reported by patient. Body Surface Area: 1.76 m Body Mass Index: 30.18 kg/m  Pulse: 68 (Regular)  BP: 138/71 (Sitting, Right Arm, Standard)   Physical Exam  General Mental Status -Alert, cooperative and good historian. General Appearance-pleasant, Not in acute distress. Orientation-Oriented X3. Build & Nutrition-Well nourished and Well developed.  Head and Neck Head-normocephalic, atraumatic . Neck Global Assessment - supple, no bruit auscultated on the right, no bruit auscultated on the left.  Eye Vision-Wears corrective lenses. Pupil - Bilateral-Regular and Round. Motion - Bilateral-EOMI.  Chest and Lung Exam Auscultation Breath sounds - clear at anterior chest wall and clear at posterior chest wall. Adventitious sounds - No Adventitious sounds.  Cardiovascular Auscultation Rhythm - Regular rate and rhythm. Heart Sounds - S1 WNL and S2 WNL. Murmurs & Other Heart Sounds: Murmur 1 - Location - Aortic Area. Timing - Early systolic. Grade - II/VI. Character - Low pitched.  Abdomen Palpation/Percussion Tenderness - Abdomen is non-tender to palpation. Rigidity (guarding) - Abdomen is soft. Auscultation Auscultation of the abdomen reveals - Bowel sounds normal.  Female Genitourinary Note: Not done, not pertinent to present illness   Musculoskeletal Note: Well-developed female, in no distress. Left knee shows no effusion. She has got about a 12-degree flexion contracture, can flex down to 110. She is very tender medially. There is no lateral tenderness or instability. Right knee range about 0 to 125 with no tenderness or instability.   Assessment & Plan  Primary osteoarthritis of left knee (M17.12)  Note:Surgical Plans: Left Total Knee Replacement  Disposition: Home with sister and friends  PCP: Dr. Buelah Manis -  Patient has been seen preoperatively and felt to be stable for surgery.  IV TXA  Anesthesia Issues: None except for nausea in the past  Signed electronically by Ok Edwards, III PA-C

## 2016-04-06 ENCOUNTER — Encounter (HOSPITAL_COMMUNITY)
Admission: RE | Disposition: A | Discharge status: Home Health | Payer: Self-pay | Source: Ambulatory Visit | Attending: Orthopedic Surgery

## 2016-04-06 ENCOUNTER — Inpatient Hospital Stay (HOSPITAL_COMMUNITY)
Admission: RE | Admit: 2016-04-06 | Discharge: 2016-04-08 | DRG: 470 | Disposition: A | Discharge status: Home Health | Payer: Medicare Other | Source: Ambulatory Visit | Attending: Orthopedic Surgery | Admitting: Orthopedic Surgery

## 2016-04-06 ENCOUNTER — Inpatient Hospital Stay (HOSPITAL_COMMUNITY): Payer: Medicare Other | Admitting: Anesthesiology

## 2016-04-06 ENCOUNTER — Encounter (HOSPITAL_COMMUNITY): Payer: Self-pay | Admitting: *Deleted

## 2016-04-06 DIAGNOSIS — Z79899 Other long term (current) drug therapy: Secondary | ICD-10-CM

## 2016-04-06 DIAGNOSIS — R7303 Prediabetes: Secondary | ICD-10-CM | POA: Diagnosis present

## 2016-04-06 DIAGNOSIS — M1710 Unilateral primary osteoarthritis, unspecified knee: Secondary | ICD-10-CM | POA: Diagnosis present

## 2016-04-06 DIAGNOSIS — M179 Osteoarthritis of knee, unspecified: Secondary | ICD-10-CM

## 2016-04-06 DIAGNOSIS — I10 Essential (primary) hypertension: Secondary | ICD-10-CM | POA: Diagnosis present

## 2016-04-06 DIAGNOSIS — Z833 Family history of diabetes mellitus: Secondary | ICD-10-CM | POA: Diagnosis not present

## 2016-04-06 DIAGNOSIS — E7800 Pure hypercholesterolemia, unspecified: Secondary | ICD-10-CM | POA: Diagnosis present

## 2016-04-06 DIAGNOSIS — K219 Gastro-esophageal reflux disease without esophagitis: Secondary | ICD-10-CM | POA: Diagnosis present

## 2016-04-06 DIAGNOSIS — J45909 Unspecified asthma, uncomplicated: Secondary | ICD-10-CM | POA: Diagnosis present

## 2016-04-06 DIAGNOSIS — M25562 Pain in left knee: Secondary | ICD-10-CM | POA: Diagnosis present

## 2016-04-06 DIAGNOSIS — M1712 Unilateral primary osteoarthritis, left knee: Principal | ICD-10-CM | POA: Diagnosis present

## 2016-04-06 DIAGNOSIS — Z8261 Family history of arthritis: Secondary | ICD-10-CM

## 2016-04-06 HISTORY — PX: TOTAL KNEE ARTHROPLASTY: SHX125

## 2016-04-06 LAB — TYPE AND SCREEN
ABO/RH(D): A POS
ANTIBODY SCREEN: NEGATIVE

## 2016-04-06 SURGERY — ARTHROPLASTY, KNEE, TOTAL
Anesthesia: Spinal | Site: Knee | Laterality: Left

## 2016-04-06 MED ORDER — OXYCODONE HCL 5 MG/5ML PO SOLN
5.0000 mg | Freq: Once | ORAL | Status: DC | PRN
  Filled 2016-04-06: qty 5

## 2016-04-06 MED ORDER — SODIUM CHLORIDE 0.9 % IV SOLN
INTRAVENOUS | Status: DC
  Administered 2016-04-06: 15:00:00 via INTRAVENOUS

## 2016-04-06 MED ORDER — CEFAZOLIN SODIUM-DEXTROSE 2-4 GM/100ML-% IV SOLN
2.0000 g | INTRAVENOUS | Status: AC
  Administered 2016-04-06: 2 g via INTRAVENOUS

## 2016-04-06 MED ORDER — ALBUTEROL SULFATE (2.5 MG/3ML) 0.083% IN NEBU: 2.5000 mg | INHALATION_SOLUTION | RESPIRATORY_TRACT | Status: DC | PRN

## 2016-04-06 MED ORDER — DEXAMETHASONE SODIUM PHOSPHATE 10 MG/ML IJ SOLN
10.0000 mg | Freq: Once | INTRAMUSCULAR | Status: AC
  Administered 2016-04-07: 10 mg via INTRAVENOUS
  Filled 2016-04-06: qty 1

## 2016-04-06 MED ORDER — HYDROMORPHONE HCL 1 MG/ML IJ SOLN: 0.2500 mg | INTRAMUSCULAR | Status: DC | PRN

## 2016-04-06 MED ORDER — BUPIVACAINE LIPOSOME 1.3 % IJ SUSP
20.0000 mL | Freq: Once | INTRAMUSCULAR | Status: DC
  Filled 2016-04-06: qty 20

## 2016-04-06 MED ORDER — BUPIVACAINE IN DEXTROSE 0.75-8.25 % IT SOLN
INTRATHECAL | Status: DC | PRN
  Administered 2016-04-06: 2 mL via INTRATHECAL

## 2016-04-06 MED ORDER — MIDAZOLAM HCL 5 MG/5ML IJ SOLN
INTRAMUSCULAR | Status: DC | PRN
  Administered 2016-04-06 (×2): 1 mg via INTRAVENOUS

## 2016-04-06 MED ORDER — BUPIVACAINE HCL 0.25 % IJ SOLN
INTRAMUSCULAR | Status: DC | PRN
  Administered 2016-04-06: 30 mL

## 2016-04-06 MED ORDER — MIDAZOLAM HCL 2 MG/2ML IJ SOLN
INTRAMUSCULAR | Status: AC
  Filled 2016-04-06: qty 2

## 2016-04-06 MED ORDER — PROPOFOL 500 MG/50ML IV EMUL
INTRAVENOUS | Status: DC | PRN
Start: 2016-04-06 — End: 2016-04-06
  Administered 2016-04-06: 100 ug/kg/min via INTRAVENOUS

## 2016-04-06 MED ORDER — LOSARTAN POTASSIUM 50 MG PO TABS
100.0000 mg | ORAL_TABLET | Freq: Every day | ORAL | Status: DC
  Administered 2016-04-07 – 2016-04-08 (×2): 100 mg via ORAL
  Filled 2016-04-06 (×2): qty 2

## 2016-04-06 MED ORDER — 0.9 % SODIUM CHLORIDE (POUR BTL) OPTIME
TOPICAL | Status: DC | PRN
  Administered 2016-04-06: 1000 mL

## 2016-04-06 MED ORDER — MENTHOL 3 MG MT LOZG
1.0000 | LOZENGE | OROMUCOSAL | Status: DC | PRN
Start: 2016-04-06 — End: 2016-04-08

## 2016-04-06 MED ORDER — METHOCARBAMOL 500 MG PO TABS
500.0000 mg | ORAL_TABLET | Freq: Four times a day (QID) | ORAL | Status: DC | PRN
  Administered 2016-04-06 – 2016-04-08 (×2): 500 mg via ORAL
  Filled 2016-04-06 (×2): qty 1

## 2016-04-06 MED ORDER — ACETAMINOPHEN 650 MG RE SUPP: 650.0000 mg | Freq: Four times a day (QID) | RECTAL | Status: DC | PRN

## 2016-04-06 MED ORDER — OXYCODONE HCL 5 MG PO TABS: 5.0000 mg | ORAL_TABLET | Freq: Once | ORAL | Status: DC | PRN

## 2016-04-06 MED ORDER — ACETAMINOPHEN 325 MG PO TABS: 650.0000 mg | ORAL_TABLET | Freq: Four times a day (QID) | ORAL | Status: DC | PRN

## 2016-04-06 MED ORDER — LORATADINE 10 MG PO TABS
10.0000 mg | ORAL_TABLET | Freq: Every day | ORAL | Status: DC
Start: 2016-04-07 — End: 2016-04-08
  Administered 2016-04-07 – 2016-04-08 (×2): 10 mg via ORAL
  Filled 2016-04-06 (×2): qty 1

## 2016-04-06 MED ORDER — PHENYLEPHRINE 40 MCG/ML (10ML) SYRINGE FOR IV PUSH (FOR BLOOD PRESSURE SUPPORT)
PREFILLED_SYRINGE | INTRAVENOUS | Status: AC
  Filled 2016-04-06: qty 10

## 2016-04-06 MED ORDER — BISACODYL 10 MG RE SUPP: 10.0000 mg | Freq: Every day | RECTAL | Status: DC | PRN

## 2016-04-06 MED ORDER — PHENOL 1.4 % MT LIQD: 1.0000 | OROMUCOSAL | Status: DC | PRN

## 2016-04-06 MED ORDER — MORPHINE SULFATE (PF) 2 MG/ML IV SOLN: 1.0000 mg | INTRAVENOUS | Status: DC | PRN

## 2016-04-06 MED ORDER — BUPIVACAINE LIPOSOME 1.3 % IJ SUSP
INTRAMUSCULAR | Status: DC | PRN
  Administered 2016-04-06: 20 mL

## 2016-04-06 MED ORDER — METHOCARBAMOL 1000 MG/10ML IJ SOLN
500.0000 mg | Freq: Four times a day (QID) | INTRAVENOUS | Status: DC | PRN
  Administered 2016-04-06: 500 mg via INTRAVENOUS
  Filled 2016-04-06: qty 550
  Filled 2016-04-06: qty 5

## 2016-04-06 MED ORDER — TRANEXAMIC ACID 1000 MG/10ML IV SOLN
1000.0000 mg | INTRAVENOUS | Status: AC
  Administered 2016-04-06: 1000 mg via INTRAVENOUS
  Filled 2016-04-06: qty 1100

## 2016-04-06 MED ORDER — SODIUM CHLORIDE 0.9 % IR SOLN
Status: DC | PRN
  Administered 2016-04-06: 1000 mL

## 2016-04-06 MED ORDER — ACETAMINOPHEN 10 MG/ML IV SOLN
1000.0000 mg | Freq: Once | INTRAVENOUS | Status: AC
  Administered 2016-04-06: 1000 mg via INTRAVENOUS

## 2016-04-06 MED ORDER — METOCLOPRAMIDE HCL 5 MG/ML IJ SOLN: 5.0000 mg | Freq: Three times a day (TID) | INTRAMUSCULAR | Status: DC | PRN

## 2016-04-06 MED ORDER — FLEET ENEMA 7-19 GM/118ML RE ENEM: 1.0000 | ENEMA | Freq: Once | RECTAL | Status: DC | PRN

## 2016-04-06 MED ORDER — PHENYLEPHRINE HCL 10 MG/ML IJ SOLN
INTRAMUSCULAR | Status: DC | PRN
  Administered 2016-04-06 (×2): 80 ug via INTRAVENOUS
  Administered 2016-04-06 (×3): 40 ug via INTRAVENOUS
  Administered 2016-04-06 (×2): 80 ug via INTRAVENOUS
  Administered 2016-04-06: 40 ug via INTRAVENOUS
  Administered 2016-04-06 (×2): 80 ug via INTRAVENOUS
  Administered 2016-04-06: 40 ug via INTRAVENOUS

## 2016-04-06 MED ORDER — RIVAROXABAN 10 MG PO TABS
10.0000 mg | ORAL_TABLET | Freq: Every day | ORAL | Status: DC
  Administered 2016-04-07 – 2016-04-08 (×2): 10 mg via ORAL
  Filled 2016-04-06 (×2): qty 1

## 2016-04-06 MED ORDER — ONDANSETRON HCL 4 MG/2ML IJ SOLN
INTRAMUSCULAR | Status: AC
Start: 2016-04-06 — End: 2016-04-06
  Filled 2016-04-06: qty 2

## 2016-04-06 MED ORDER — DEXAMETHASONE SODIUM PHOSPHATE 10 MG/ML IJ SOLN
INTRAMUSCULAR | Status: AC
  Filled 2016-04-06: qty 1

## 2016-04-06 MED ORDER — PROPOFOL 10 MG/ML IV BOLUS
INTRAVENOUS | Status: AC
  Filled 2016-04-06: qty 60

## 2016-04-06 MED ORDER — LACTATED RINGERS IV SOLN
INTRAVENOUS | Status: DC
  Administered 2016-04-06 (×3): via INTRAVENOUS

## 2016-04-06 MED ORDER — METOPROLOL SUCCINATE ER 50 MG PO TB24
50.0000 mg | ORAL_TABLET | Freq: Every day | ORAL | Status: DC
  Administered 2016-04-07 – 2016-04-08 (×2): 50 mg via ORAL
  Filled 2016-04-06 (×2): qty 1

## 2016-04-06 MED ORDER — SODIUM CHLORIDE 0.9 % IJ SOLN
INTRAMUSCULAR | Status: AC
  Filled 2016-04-06: qty 50

## 2016-04-06 MED ORDER — ONDANSETRON HCL 4 MG/2ML IJ SOLN
4.0000 mg | Freq: Four times a day (QID) | INTRAMUSCULAR | Status: DC | PRN
Start: 2016-04-06 — End: 2016-04-08

## 2016-04-06 MED ORDER — ACETAMINOPHEN 500 MG PO TABS
1000.0000 mg | ORAL_TABLET | Freq: Four times a day (QID) | ORAL | Status: AC
  Administered 2016-04-06 – 2016-04-07 (×4): 1000 mg via ORAL
  Filled 2016-04-06 (×4): qty 2

## 2016-04-06 MED ORDER — LIDOCAINE 2% (20 MG/ML) 5 ML SYRINGE
INTRAMUSCULAR | Status: AC
  Filled 2016-04-06: qty 5

## 2016-04-06 MED ORDER — METOCLOPRAMIDE HCL 5 MG PO TABS: 5.0000 mg | ORAL_TABLET | Freq: Three times a day (TID) | ORAL | Status: DC | PRN

## 2016-04-06 MED ORDER — LIDOCAINE HCL (CARDIAC) 20 MG/ML IV SOLN
INTRAVENOUS | Status: DC | PRN
  Administered 2016-04-06: 50 mg via INTRAVENOUS

## 2016-04-06 MED ORDER — BUPIVACAINE HCL (PF) 0.25 % IJ SOLN
INTRAMUSCULAR | Status: AC
  Filled 2016-04-06: qty 30

## 2016-04-06 MED ORDER — DEXAMETHASONE SODIUM PHOSPHATE 10 MG/ML IJ SOLN: 10.0000 mg | Freq: Once | INTRAMUSCULAR | Status: DC

## 2016-04-06 MED ORDER — DIPHENHYDRAMINE HCL 12.5 MG/5ML PO ELIX: 12.5000 mg | ORAL_SOLUTION | ORAL | Status: DC | PRN

## 2016-04-06 MED ORDER — OXYCODONE HCL 5 MG PO TABS
5.0000 mg | ORAL_TABLET | ORAL | Status: DC | PRN
  Administered 2016-04-06 – 2016-04-08 (×2): 5 mg via ORAL
  Filled 2016-04-06 (×2): qty 1

## 2016-04-06 MED ORDER — CHLORHEXIDINE GLUCONATE 4 % EX LIQD: 60.0000 mL | Freq: Once | CUTANEOUS | Status: DC

## 2016-04-06 MED ORDER — DOCUSATE SODIUM 100 MG PO CAPS
100.0000 mg | ORAL_CAPSULE | Freq: Two times a day (BID) | ORAL | Status: DC
  Administered 2016-04-06 – 2016-04-08 (×4): 100 mg via ORAL
  Filled 2016-04-06 (×4): qty 1

## 2016-04-06 MED ORDER — ACETAMINOPHEN 10 MG/ML IV SOLN
INTRAVENOUS | Status: AC
  Filled 2016-04-06: qty 100

## 2016-04-06 MED ORDER — TRAMADOL HCL 50 MG PO TABS
50.0000 mg | ORAL_TABLET | Freq: Four times a day (QID) | ORAL | Status: DC | PRN
  Administered 2016-04-06 – 2016-04-08 (×7): 100 mg via ORAL
  Filled 2016-04-06 (×7): qty 2

## 2016-04-06 MED ORDER — PROPOFOL 10 MG/ML IV BOLUS
INTRAVENOUS | Status: DC | PRN
  Administered 2016-04-06: 20 mg via INTRAVENOUS

## 2016-04-06 MED ORDER — SODIUM CHLORIDE 0.9 % IJ SOLN
INTRAMUSCULAR | Status: DC | PRN
  Administered 2016-04-06: 30 mL

## 2016-04-06 MED ORDER — ONDANSETRON HCL 4 MG/2ML IJ SOLN
INTRAMUSCULAR | Status: DC | PRN
  Administered 2016-04-06: 4 mg via INTRAVENOUS

## 2016-04-06 MED ORDER — CEFAZOLIN SODIUM-DEXTROSE 2-4 GM/100ML-% IV SOLN
2.0000 g | Freq: Four times a day (QID) | INTRAVENOUS | Status: AC
  Administered 2016-04-06 (×2): 2 g via INTRAVENOUS
  Filled 2016-04-06 (×2): qty 100

## 2016-04-06 MED ORDER — ONDANSETRON HCL 4 MG PO TABS: 4.0000 mg | ORAL_TABLET | Freq: Four times a day (QID) | ORAL | Status: DC | PRN

## 2016-04-06 MED ORDER — TRANEXAMIC ACID 1000 MG/10ML IV SOLN
1000.0000 mg | Freq: Once | INTRAVENOUS | Status: AC
  Administered 2016-04-06: 1000 mg via INTRAVENOUS
  Filled 2016-04-06: qty 10

## 2016-04-06 MED ORDER — POLYETHYLENE GLYCOL 3350 17 G PO PACK
17.0000 g | PACK | Freq: Every day | ORAL | Status: DC | PRN
  Administered 2016-04-07: 17 g via ORAL
  Filled 2016-04-06: qty 1

## 2016-04-06 MED ORDER — STERILE WATER FOR IRRIGATION IR SOLN
Status: DC | PRN
  Administered 2016-04-06: 3000 mL

## 2016-04-06 MED ORDER — CEFAZOLIN SODIUM-DEXTROSE 2-4 GM/100ML-% IV SOLN
INTRAVENOUS | Status: AC
  Filled 2016-04-06: qty 100

## 2016-04-06 MED ORDER — PRAVASTATIN SODIUM 20 MG PO TABS
20.0000 mg | ORAL_TABLET | Freq: Every day | ORAL | Status: DC
  Administered 2016-04-07 – 2016-04-08 (×2): 20 mg via ORAL
  Filled 2016-04-06 (×2): qty 1

## 2016-04-06 MED ORDER — FAMOTIDINE 20 MG PO TABS
20.0000 mg | ORAL_TABLET | Freq: Every day | ORAL | Status: DC
  Administered 2016-04-07 – 2016-04-08 (×2): 20 mg via ORAL
  Filled 2016-04-06 (×2): qty 1

## 2016-04-06 SURGICAL SUPPLY — 50 items
BAG DECANTER FOR FLEXI CONT (MISCELLANEOUS) ×1 IMPLANT
BAG SPEC THK2 15X12 ZIP CLS (MISCELLANEOUS) ×1
BAG ZIPLOCK 12X15 (MISCELLANEOUS) ×3 IMPLANT
BANDAGE ACE 6X5 VEL STRL LF (GAUZE/BANDAGES/DRESSINGS) ×3 IMPLANT
BLADE SAG 18X100X1.27 (BLADE) ×3 IMPLANT
BLADE SAW SGTL 11.0X1.19X90.0M (BLADE) ×3 IMPLANT
BOWL SMART MIX CTS (DISPOSABLE) ×3 IMPLANT
CAPT KNEE TOTAL 3 ATTUNE ×2 IMPLANT
CEMENT HV SMART SET (Cement) ×8 IMPLANT
CLOSURE WOUND 1/2 X4 (GAUZE/BANDAGES/DRESSINGS) ×1
CLOTH BEACON ORANGE TIMEOUT ST (SAFETY) ×3 IMPLANT
CUFF TOURN SGL QUICK 34 (TOURNIQUET CUFF) ×3
CUFF TRNQT CYL 34X4X40X1 (TOURNIQUET CUFF) ×1 IMPLANT
DECANTER SPIKE VIAL GLASS SM (MISCELLANEOUS) ×3 IMPLANT
DRAPE U-SHAPE 47X51 STRL (DRAPES) ×3 IMPLANT
DRSG ADAPTIC 3X8 NADH LF (GAUZE/BANDAGES/DRESSINGS) ×3 IMPLANT
DRSG PAD ABDOMINAL 8X10 ST (GAUZE/BANDAGES/DRESSINGS) ×3 IMPLANT
DURAPREP 26ML APPLICATOR (WOUND CARE) ×3 IMPLANT
ELECT REM PT RETURN 9FT ADLT (ELECTROSURGICAL) ×3
ELECTRODE REM PT RTRN 9FT ADLT (ELECTROSURGICAL) ×1 IMPLANT
EVACUATOR 1/8 PVC DRAIN (DRAIN) ×3 IMPLANT
GAUZE SPONGE 4X4 12PLY STRL (GAUZE/BANDAGES/DRESSINGS) ×3 IMPLANT
GLOVE BIO SURGEON STRL SZ7.5 (GLOVE) ×4 IMPLANT
GLOVE BIO SURGEON STRL SZ8 (GLOVE) ×3 IMPLANT
GLOVE BIOGEL PI IND STRL 6.5 (GLOVE) IMPLANT
GLOVE BIOGEL PI IND STRL 8 (GLOVE) ×1 IMPLANT
GLOVE BIOGEL PI INDICATOR 6.5 (GLOVE) ×8
GLOVE BIOGEL PI INDICATOR 8 (GLOVE) ×2
GLOVE SURG SS PI 6.5 STRL IVOR (GLOVE) IMPLANT
GOWN STRL REUS W/TWL LRG LVL3 (GOWN DISPOSABLE) ×3 IMPLANT
GOWN STRL REUS W/TWL XL LVL3 (GOWN DISPOSABLE) IMPLANT
HANDPIECE INTERPULSE COAX TIP (DISPOSABLE) ×3
IMMOBILIZER KNEE 20 (SOFTGOODS) ×3
IMMOBILIZER KNEE 20 THIGH 36 (SOFTGOODS) ×1 IMPLANT
MANIFOLD NEPTUNE II (INSTRUMENTS) ×3 IMPLANT
NS IRRIG 1000ML POUR BTL (IV SOLUTION) ×3 IMPLANT
PACK TOTAL KNEE CUSTOM (KITS) ×3 IMPLANT
PADDING CAST COTTON 6X4 STRL (CAST SUPPLIES) ×5 IMPLANT
POSITIONER SURGICAL ARM (MISCELLANEOUS) ×3 IMPLANT
SET HNDPC FAN SPRY TIP SCT (DISPOSABLE) ×1 IMPLANT
STRIP CLOSURE SKIN 1/2X4 (GAUZE/BANDAGES/DRESSINGS) ×3 IMPLANT
SUT MNCRL AB 4-0 PS2 18 (SUTURE) ×3 IMPLANT
SUT VIC AB 2-0 CT1 27 (SUTURE) ×9
SUT VIC AB 2-0 CT1 TAPERPNT 27 (SUTURE) ×3 IMPLANT
SUT VLOC 180 0 24IN GS25 (SUTURE) ×3 IMPLANT
SYR 50ML LL SCALE MARK (SYRINGE) ×3 IMPLANT
TRAY FOLEY W/METER SILVER 16FR (SET/KITS/TRAYS/PACK) ×3 IMPLANT
WATER STERILE IRR 1500ML POUR (IV SOLUTION) ×3 IMPLANT
WRAP KNEE MAXI GEL POST OP (GAUZE/BANDAGES/DRESSINGS) ×3 IMPLANT
YANKAUER SUCT BULB TIP 10FT TU (MISCELLANEOUS) ×3 IMPLANT

## 2016-04-06 NOTE — Anesthesia Procedure Notes (Signed)
Spinal  Patient location during procedure: OR Staffing Anesthesiologist: Oswell Say Preanesthetic Checklist Completed: patient identified, surgical consent, pre-op evaluation, timeout performed, IV checked, risks and benefits discussed and monitors and equipment checked Spinal Block Patient position: sitting Prep: site prepped and draped and DuraPrep Patient monitoring: heart rate, cardiac monitor, continuous pulse ox and blood pressure Approach: midline Location: L4-5 Injection technique: single-shot Needle Needle type: Pencan  Needle gauge: 24 G Needle length: 10 cm Assessment Sensory level: T6

## 2016-04-06 NOTE — Interval H&P Note (Signed)
History and Physical Interval Note:  04/06/2016 8:54 AM  Cathy [name]  has presented today for surgery, with the diagnosis of LEFT KNEE OA   The various methods of treatment have been discussed with the patient and family. After consideration of risks, benefits and other options for treatment, the patient has consented to  Procedure(s): LEFT TOTAL KNEE ARTHROPLASTY (Left) as a surgical intervention .  The patient's history has been reviewed, patient examined, no change in status, stable for surgery.  I have reviewed the patient's chart and labs.  Questions were answered to the patient's satisfaction.     Gearlean Alf

## 2016-04-06 NOTE — Anesthesia Preprocedure Evaluation (Signed)
Anesthesia Evaluation  Patient identified by MRN, date of birth, ID band Patient awake    Reviewed: Allergy & Precautions, NPO status , Patient's Chart, lab work & pertinent test results  History of Anesthesia Complications (+) PONV and history of anesthetic complications  Airway Mallampati: II  TM Distance: >3 FB Neck ROM: Full    Dental  (+) Teeth Intact   Pulmonary asthma ,    breath sounds clear to auscultation       Cardiovascular hypertension, Pt. on medications and Pt. on home beta blockers  Rhythm:Regular     Neuro/Psych negative neurological ROS  negative psych ROS   GI/Hepatic Neg liver ROS, GERD  Medicated and Controlled,  Endo/Other    Renal/GU Renal disease     Musculoskeletal  (+) Arthritis ,   Abdominal   Peds  Hematology negative hematology ROS (+)   Anesthesia Other Findings   Reproductive/Obstetrics                             Anesthesia Physical Anesthesia Plan  ASA: II  Anesthesia Plan: Spinal   Post-op Pain Management:    Induction:   Airway Management Planned: Natural Airway, Nasal Cannula and Simple Face Mask  Additional Equipment: None  Intra-op Plan:   Post-operative Plan:   Informed Consent: I have reviewed the patients History and Physical, chart, labs and discussed the procedure including the risks, benefits and alternatives for the proposed anesthesia with the patient or authorized representative who has indicated his/her understanding and acceptance.   Dental advisory given  Plan Discussed with: CRNA and Surgeon  Anesthesia Plan Comments:         Anesthesia Quick Evaluation

## 2016-04-06 NOTE — Op Note (Signed)
OPERATIVE REPORT-TOTAL KNEE ARTHROPLASTY   Pre-operative diagnosis- Osteoarthritis  Left knee(s)  Post-operative diagnosis- Osteoarthritis Left knee(s)  Procedure-  Left  Total Knee Arthroplasty (Depuy Attune)  Surgeon- Dione Plover. Ayahna Solazzo, MD  Assistant- Arlee Muslim, PA-C   Anesthesia-  Spinal  EBL-* No blood loss amount entered *   Drains Hemovac  Tourniquet time-  Total Tourniquet Time Documented: Thigh (Left) - 33 minutes Total: Thigh (Left) - 33 minutes     Complications- None  Condition-PACU - hemodynamically stable.   Brief Clinical Note  Cathy [name] is a 70 y.o. year old female with end stage OA of her left knee with progressively worsening pain and dysfunction. She has constant pain, with activity and at rest and significant functional deficits with difficulties even with ADLs. She has had extensive non-op management including analgesics, injections of cortisone and viscosupplements, and home exercise program, but remains in significant pain with significant dysfunction. Radiographs show bone on bone arthritis medial and patellofemoral. She presents now for left Total Knee Arthroplasty.    Procedure in detail---   The patient is brought into the operating room and positioned supine on the operating table. After successful administration of  Spinal,   a tourniquet is placed high on the  Left thigh(s) and the lower extremity is prepped and draped in the usual sterile fashion. Time out is performed by the operating team and then the  Left lower extremity is wrapped in Esmarch, knee flexed and the tourniquet inflated to 300 mmHg.       A midline incision is made with a ten blade through the subcutaneous tissue to the level of the extensor mechanism. A fresh blade is used to make a medial parapatellar arthrotomy. Soft tissue over the proximal medial tibia is subperiosteally elevated to the joint line with a knife and into the semimembranosus bursa with a Cobb elevator. Soft  tissue over the proximal lateral tibia is elevated with attention being paid to avoiding the patellar tendon on the tibial tubercle. The patella is everted, knee flexed 90 degrees and the ACL and PCL are removed. Findings are bone on bone medial and patellofemoral with large global osteophytes.        The drill is used to create a starting hole in the distal femur and the canal is thoroughly irrigated with sterile saline to remove the fatty contents. The 5 degree Left  valgus alignment guide is placed into the femoral canal and the distal femoral cutting block is pinned to remove 10 mm off the distal femur. Resection is made with an oscillating saw.      The tibia is subluxed forward and the menisci are removed. The extramedullary alignment guide is placed referencing proximally at the medial aspect of the tibial tubercle and distally along the second metatarsal axis and tibial crest. The block is pinned to remove 67mm off the more deficient medial  side. Resection is made with an oscillating saw. Size 4is the most appropriate size for the tibia and the proximal tibia is prepared with the modular drill and keel punch for that size.      The femoral sizing guide is placed and size 5 is most appropriate. Rotation is marked off the epicondylar axis and confirmed by creating a rectangular flexion gap at 90 degrees. The size 5 cutting block is pinned in this rotation and the anterior, posterior and chamfer cuts are made with the oscillating saw. The intercondylar block is then placed and that cut is made.  Trial size 4 tibial component, trial size 5 posterior stabilized femur and a 8  mm posterior stabilized rotating platform insert trial is placed. Full extension is achieved with excellent varus/valgus and anterior/posterior balance throughout full range of motion. The patella is everted and thickness measured to be 22  mm. Free hand resection is taken to 12 mm, a 35 template is placed, lug holes are drilled,  trial patella is placed, and it tracks normally. Osteophytes are removed off the posterior femur with the trial in place. All trials are removed and the cut bone surfaces prepared with pulsatile lavage. Cement is mixed and once ready for implantation, the size 4 tibial implant, size  5 posterior stabilized femoral component, and the size 35 patella are cemented in place and the patella is held with the clamp. The trial insert is placed and the knee held in full extension. The Exparel (20 ml mixed with 30 ml saline) and .25% Bupivicaine, are injected into the extensor mechanism, posterior capsule, medial and lateral gutters and subcutaneous tissues.  All extruded cement is removed and once the cement is hard the permanent 8 mm posterior stabilized rotating platform insert is placed into the tibial tray.      The wound is copiously irrigated with saline solution and the extensor mechanism closed over a hemovac drain with #1 V-loc suture. The tourniquet is released for a total tourniquet time of 33  minutes. Flexion against gravity is 140 degrees and the patella tracks normally. Subcutaneous tissue is closed with 2.0 vicryl and subcuticular with running 4.0 Monocryl. The incision is cleaned and dried and steri-strips and a bulky sterile dressing are applied. The limb is placed into a knee immobilizer and the patient is awakened and transported to recovery in stable condition.      Please note that a surgical assistant was a medical necessity for this procedure in order to perform it in a safe and expeditious manner. Surgical assistant was necessary to retract the ligaments and vital neurovascular structures to prevent injury to them and also necessary for proper positioning of the limb to allow for anatomic placement of the prosthesis.   Dione Plover Kylea Berrong, MD    04/06/2016, 11:15 AM

## 2016-04-06 NOTE — Transfer of Care (Signed)
Immediate Anesthesia Transfer of Care Note  Patient: Cathy [name]  Procedure(s) Performed: Procedure(s): LEFT TOTAL KNEE ARTHROPLASTY (Left)  Patient Location: PACU  Anesthesia Type:Spinal  Level of Consciousness: awake, alert  and oriented  Airway & Oxygen Therapy: Patient Spontanous Breathing and Patient connected to face mask oxygen  Post-op Assessment: Report given to RN and Post -op Vital signs reviewed and stable  Post vital signs: Reviewed and stable  Last Vitals:  Vitals:   04/06/16 0655  BP: (!) 166/82  Pulse: 85  Resp: 16  Temp: 36.6 C    Last Pain:  Vitals:   04/06/16 0655  TempSrc: Oral      Patients Stated Pain Goal: 4 ([id number])  Complications: No apparent anesthesia complications

## 2016-04-06 NOTE — H&P (View-Only) (Signed)
PEARL CLAMP DOB: 1946/01/06 Widowed / Language: Cleophus Molt / Race: White Female Date of Admission:  04/06/2016 CC:  Left Knee Pain History of Present Illness The patient is a 70 year old female who comes in for a preoperative History and Physical. The patient is scheduled for a left total knee arthroplasty to be performed by Dr. Dione Plover. Aluisio, MD at [facility] on 04-06-2016. The patient is a 70 year old female who presented for follow up of their knee. The patient is being followed for their left knee pain and osteoarthritis. They are now months out from Euflexxa series. Symptoms reported include: pain. The patient feels that they are doing poorly and report their pain level to be mild to moderate (pain varies). The following medication has been used for pain control: antiinflammatory medication and Ultram. The patient has reported improvement of their symptoms with: viscosupplementation (pt. state's the injections helped some, but she is still in a lot of pain). Mrs. Kimbel comes back in for follow up on the gel series. She received a little benefit but not a tremendous amount. She still has pain and especially with certain movements. She was hoping for a better response. She is now at a point where she wants to proceed surgery. She has had several friends tell her that she should go ahead and get the surgery. She has got advanced medial and patellofemoral arthritis in that left knee with significant flexion contracture. No response to cortisone and viscosupplements. At this point, the most predictable means of improving her and function is going to be total knee arthroplasty. We did discuss that in detail today and she would like to go ahead and proceed. They have been treated conservatively in the past for the above stated problem and despite conservative measures, they continue to have progressive pain and severe functional limitations and dysfunction. They have failed non-operative  management including home exercise, medications, and injections. It is felt that they would benefit from undergoing total joint replacement. Risks and benefits of the procedure have been discussed with the patient and they elect to proceed with surgery. There are no active contraindications to surgery such as ongoing infection or rapidly progressive neurological disease.  Problem List/Past Medical Primary osteoarthritis of both knees (M17.0)  Asthma  allergy-induced Gastroesophageal Reflux Disease  High blood pressure  Kidney Stone  Vertigo  Cataract  Hypercholesterolemia  Varicose veins  Hiatal Hernia  Hemorrhoids  Urinary Incontinence  Menopause  Measles  Allergies Codeine Sulfate *ANALGESICS - OPIOID*   Family History  Cancer  Father. Cerebrovascular Accident  Maternal Grandmother, Mother. Congestive Heart Failure  Maternal Grandmother, Mother. Diabetes Mellitus  Paternal Grandfather. First Degree Relatives  reported Heart Disease  Maternal Grandmother, Mother. Heart disease in female family member before age 29  Heart disease in female family member before age 22  Hypertension  Mother. Kidney disease  Father. Osteoarthritis  Mother, Sister. Osteoporosis  Mother, Sister. Rheumatoid Arthritis  Maternal Grandfather.  Social History Children  1 Current work status  retired Furniture conservator/restorer rarely; does running / walking Living situation  live alone Marital status  widowed Never consumed alcohol  09/17/2015: Never consumed alcohol No history of drug/alcohol rehab  Not under pain contract  Number of flights of stairs before winded  1 Tobacco / smoke exposure  09/17/2015: no Tobacco use  Never smoker. 09/17/2015 Advance Directives  Living Will, Healthcare POA  Medication History  Accu-Chek Active (In Vitro) Active. Albuterol (90MCG/ACT Aerosol Soln, Inhalation) Active. Calcium Carbonate (  1500 (600 Ca)MG Tablet, Oral)  Active. ZyrTEC Allergy (10MG  Capsule, Oral) Active. Cinnamon (500MG  Capsule, Oral) Active. EPINEPHrine (0.3MG /0.3ML Soln Auto-inj, Intramuscular) Active. Glucosamine Chondroitin Complx (Oral) Active. Ibuprofen (200MG  Tablet, Oral) Active. Losartan Potassium (100MG  Tablet, Oral) Active. Metoprolol Succinate ER (50MG  Tablet ER 24HR, Oral) Active. Multi Vitamin Daily (Oral) Active. Omega 3 (1000MG  Capsule, Oral) Active. Pravastatin Sodium (20MG  Tablet, Oral) Active. Zantac (150MG  Tablet, Oral) Active. Visine Advanced Relief (0.05-0.1-1-1% Solution, Ophthalmic) Active. TraMADol HCl (50MG  Tablet, Oral) Active.  Past Surgical History Colon Polyp Removal - Colonoscopy  Gallbladder Surgery  laporoscopic Lithotripsy    Review of Systems  General Not Present- Chills, Fatigue, Fever, Memory Loss, Night Sweats, Weight Gain and Weight Loss. Skin Not Present- Eczema, Hives, Itching, Lesions and Rash. HEENT Not Present- Dentures, Double Vision, Headache, Hearing Loss, Tinnitus and Visual Loss. Respiratory Not Present- Allergies, Chronic Cough, Coughing up blood, Shortness of breath at rest and Shortness of breath with exertion. Cardiovascular Not Present- Chest Pain, Difficulty Breathing Lying Down, Murmur, Palpitations, Racing/skipping heartbeats and Swelling. Gastrointestinal Not Present- Abdominal Pain, Bloody Stool, Constipation, Diarrhea, Difficulty Swallowing, Heartburn, Jaundice, Loss of appetitie, Nausea and Vomiting. Female Genitourinary Present- Incontinence, Urinary frequency and Urinating at Night. Not Present- Blood in Urine, Discharge, Flank Pain, Painful Urination, Urgency, Urinary Retention and Weak urinary stream. Musculoskeletal Present- Back Pain, Joint Pain, Joint Swelling, Morning Stiffness, Muscle Pain and Muscle Weakness. Not Present- Spasms. Neurological Not Present- Blackout spells, Difficulty with balance, Dizziness, Paralysis, Tremor and  Weakness. Psychiatric Not Present- Insomnia.  Vitals Weight: 165 lb Height: 62in Weight was reported by patient. Height was reported by patient. Body Surface Area: 1.76 m Body Mass Index: 30.18 kg/m  Pulse: 68 (Regular)  BP: 138/71 (Sitting, Right Arm, Standard)   Physical Exam  General Mental Status -Alert, cooperative and good historian. General Appearance-pleasant, Not in acute distress. Orientation-Oriented X3. Build & Nutrition-Well nourished and Well developed.  Head and Neck Head-normocephalic, atraumatic . Neck Global Assessment - supple, no bruit auscultated on the right, no bruit auscultated on the left.  Eye Vision-Wears corrective lenses. Pupil - Bilateral-Regular and Round. Motion - Bilateral-EOMI.  Chest and Lung Exam Auscultation Breath sounds - clear at anterior chest wall and clear at posterior chest wall. Adventitious sounds - No Adventitious sounds.  Cardiovascular Auscultation Rhythm - Regular rate and rhythm. Heart Sounds - S1 WNL and S2 WNL. Murmurs & Other Heart Sounds: Murmur 1 - Location - Aortic Area. Timing - Early systolic. Grade - II/VI. Character - Low pitched.  Abdomen Palpation/Percussion Tenderness - Abdomen is non-tender to palpation. Rigidity (guarding) - Abdomen is soft. Auscultation Auscultation of the abdomen reveals - Bowel sounds normal.  Female Genitourinary Note: Not done, not pertinent to present illness   Musculoskeletal Note: Well-developed female, in no distress. Left knee shows no effusion. She has got about a 12-degree flexion contracture, can flex down to 110. She is very tender medially. There is no lateral tenderness or instability. Right knee range about 0 to 125 with no tenderness or instability.   Assessment & Plan  Primary osteoarthritis of left knee (M17.12)  Note:Surgical Plans: Left Total Knee Replacement  Disposition: Home with sister and friends  PCP: Dr. Buelah Manis -  Patient has been seen preoperatively and felt to be stable for surgery.  IV TXA  Anesthesia Issues: None except for nausea in the past  Signed electronically by Ok Edwards, III PA-C

## 2016-04-06 NOTE — Anesthesia Postprocedure Evaluation (Signed)
Anesthesia Post Note  Patient: Cathy [name]  Procedure(s) Performed: Procedure(s) (LRB): LEFT TOTAL KNEE ARTHROPLASTY (Left)  Patient location during evaluation: PACU Anesthesia Type: Spinal Level of consciousness: awake Pain management: pain level controlled Vital Signs Assessment: post-procedure vital signs reviewed and stable Respiratory status: spontaneous breathing Cardiovascular status: stable Postop Assessment: spinal receding and no signs of nausea or vomiting Anesthetic complications: no    Last Vitals:  Vitals:   04/06/16 1533 04/06/16 1653  BP: (!) 141/62 (!) 148/65  Pulse: 82 80  Resp: 14 16  Temp:  36.4 C    Last Pain:  Vitals:   04/06/16 1700  TempSrc:   PainSc: 3                  Lakina Mcintire

## 2016-04-06 NOTE — Evaluation (Signed)
Physical Therapy Evaluation Patient Details Name: Cathy [name] MRN: [id number] DOB: 1945/09/14 Today's Date: 04/06/2016   History of Present Illness  Pt s/p L TKR  Clinical Impression  Pt s/p L TKR presents with decreased L LE strength/ROM and post op pain limiting functional mobility.  Pt should progress to dc home with 24/7 assist of family/friends and HHPT follow up.    Follow Up Recommendations Home health PT    Equipment Recommendations  None recommended by PT    Recommendations for Other Services OT consult     Precautions / Restrictions Precautions Precautions: Knee;Fall Restrictions Weight Bearing Restrictions: No Other Position/Activity Restrictions: WBAT      Mobility  Bed Mobility Overal bed mobility: Needs Assistance Bed Mobility: Supine to Sit     Supine to sit: Min assist     General bed mobility comments: cues for sequence, posture and position from RW  Transfers Overall transfer level: Needs assistance Equipment used: Rolling walker (2 wheeled) Transfers: Sit to/from Stand Sit to Stand: +2 safety/equipment;+2 physical assistance;Min assist;Mod assist         General transfer comment: cues for LE management and use of UEs to self assist  Ambulation/Gait Ambulation/Gait assistance: Min assist;+2 physical assistance;+2 safety/equipment Ambulation Distance (Feet): 4 Feet Assistive device: Rolling walker (2 wheeled) Gait Pattern/deviations: Step-to pattern;Decreased step length - right;Decreased step length - left;Shuffle;Trunk flexed Gait velocity: decr Gait velocity interpretation: Below normal speed for age/gender General Gait Details: cues for sequence, posture and position from RW.  Stairs            Wheelchair Mobility    Modified Rankin (Stroke Patients Only)       Balance Overall balance assessment: Needs assistance Sitting-balance support: No upper extremity supported;Feet supported Sitting balance-Leahy Scale: Good      Standing balance support: Bilateral upper extremity supported Standing balance-Leahy Scale: Poor                               Pertinent Vitals/Pain Pain Assessment: 0-10 Pain Score: 4  Pain Location: L knee Pain Descriptors / Indicators: Aching;Sore Pain Intervention(s): Limited activity within patient's tolerance;Monitored during session;Premedicated before session;Ice applied    Home Living Family/patient expects to be discharged to:: Private residence Living Arrangements: Alone Available Help at Discharge: Family;Friend(s);Available 24 hours/day Type of Home: House Home Access: Stairs to enter   [employer] of Steps: 1 Home Layout: Two level Home Equipment: Walker - 2 wheels;Bedside commode      Prior Function Level of Independence: Independent               Hand Dominance        Extremity/Trunk Assessment   Upper Extremity Assessment: Overall WFL for tasks assessed           Lower Extremity Assessment: LLE deficits/detail      Cervical / Trunk Assessment: Normal  Communication   Communication: No difficulties  Cognition Arousal/Alertness: Awake/alert Behavior During Therapy: WFL for tasks assessed/performed Overall Cognitive Status: Within Functional Limits for tasks assessed                      General Comments      Exercises Total Joint Exercises Ankle Circles/Pumps: AROM;Both;15 reps;Supine   Assessment/Plan    PT Assessment Patient needs continued PT services  PT Problem List Decreased strength;Decreased range of motion;Decreased activity tolerance;Decreased balance;Decreased mobility;Decreased knowledge of use of DME;Pain  PT Treatment Interventions DME instruction;Gait training;Stair training;Functional mobility training;Therapeutic activities;Therapeutic exercise;Patient/family education    PT Goals (Current goals can be found in the Care Plan section)  Acute Rehab PT Goals Patient  Stated Goal: Regain IND PT Goal Formulation: With patient Time For Goal Achievement: 04/11/16 Potential to Achieve Goals: Good    Frequency 7X/week   Barriers to discharge        Co-evaluation               End of Session Equipment Utilized During Treatment: Gait belt;Left knee immobilizer Activity Tolerance: Patient tolerated treatment well Patient left: in chair;with call bell/phone within reach;with family/visitor present Nurse Communication: Mobility status         Time: [id number] PT Time Calculation (min) (ACUTE ONLY): 22 min   Charges:   PT Evaluation $PT Eval Low Complexity: 1 Procedure     PT G Codes:        Cathy [name] 2016-04-20, 5:06 PM

## 2016-04-07 LAB — CBC
HCT: 36.8 % (ref 36.0–46.0)
HEMOGLOBIN: 12.1 g/dL (ref 12.0–15.0)
MCH: 29.4 pg (ref 26.0–34.0)
MCHC: 32.9 g/dL (ref 30.0–36.0)
MCV: 89.5 fL (ref 78.0–100.0)
Platelets: 239 10*3/uL (ref 150–400)
RBC: 4.11 MIL/uL (ref 3.87–5.11)
RDW: 13.1 % (ref 11.5–15.5)
WBC: 17.5 10*3/uL — ABNORMAL HIGH (ref 4.0–10.5)

## 2016-04-07 LAB — BASIC METABOLIC PANEL
Anion gap: 6 (ref 5–15)
BUN: 15 mg/dL (ref 6–20)
CHLORIDE: 108 mmol/L (ref 101–111)
CO2: 25 mmol/L (ref 22–32)
CREATININE: 0.85 mg/dL (ref 0.44–1.00)
Calcium: 8.6 mg/dL — ABNORMAL LOW (ref 8.9–10.3)
GFR calc Af Amer: >60 mL/min (ref >60)
GFR calc non Af Amer: >60 mL/min (ref >60)
GLUCOSE: 152 mg/dL — AB (ref 65–99)
Potassium: 4.3 mmol/L (ref 3.5–5.1)
SODIUM: 139 mmol/L (ref 135–145)

## 2016-04-07 NOTE — Care Management Note (Addendum)
Case Management Note  Patient Details  Name: Cathy [name] MRN: [id number] Date of Birth: Aug 11, 1945  Subjective/Objective:   70 y.o. F admitted 04/06/2016 for L TKA. Pt will have the assistance of friends, family and faith community in the immediate postop period as she is a widow. No DME needs. Pt has chosen [facility] to provide HHPT which has been recommended by PT. Tim, the rep for that agency is aware. No further CM needs at this time. Anticipate Discharge Friday.                  Action/Plan:CM will sign off for now but will be available should additional discharge needs arise or disposition change.    Expected Discharge Date:                  Expected Discharge Plan:  Home/Self Care  In-House Referral:  NA  Discharge planning Services  CM Consult  Post Acute Care Choice:  Home Health Choice offered to:  Patient  DME Arranged:   (Has RW and BSC) DME Agency:  NA  HH Arranged:    [place] Agency:  [facility] (now [facility])  Status of Service:  Completed, signed off  If discussed at [employer] of Stay Meetings, dates discussed:    Additional Comments:  Delrae Sawyers, RN 04/07/2016, 9:56 AM

## 2016-04-07 NOTE — Progress Notes (Signed)
Physical Therapy Treatment Patient Details Name: Cathy [name] MRN: [id number] DOB: 24-Mar-1946 Today's Date: 16-Apr-2016    History of Present Illness Pt s/p L TKR    PT Comments      Follow Up Recommendations  Home health PT     Equipment Recommendations  None recommended by PT    Recommendations for Other Services OT consult     Precautions / Restrictions Precautions Precautions: Knee;Fall Restrictions Weight Bearing Restrictions: No LLE Weight Bearing: Weight bearing as tolerated    Mobility  Bed Mobility           Sit to supine: Min assist   General bed mobility comments: NT - Pt OOB and requests back to chair  Transfers                 General transfer comment: pt was up with student RN  Ambulation/Gait                 Stairs            Wheelchair Mobility    Modified Rankin (Stroke Patients Only)       Balance                                    Cognition Arousal/Alertness: Awake/alert Behavior During Therapy: WFL for tasks assessed/performed Overall Cognitive Status: Within Functional Limits for tasks assessed                      Exercises Total Joint Exercises Ankle Circles/Pumps: AROM;Both;15 reps;Supine Quad Sets: AROM;Both;10 reps;Supine Heel Slides: AAROM;Left Straight Leg Raises: AAROM;Left;10 reps;Supine    General Comments        Pertinent Vitals/Pain Pain Assessment: 0-10 Pain Score: 5  Pain Location: L knee Pain Descriptors / Indicators: Aching;Sore Pain Intervention(s): Limited activity within patient's tolerance;Monitored during session;Premedicated before session;Ice applied    Home Living Family/patient expects to be discharged to:: Private residence Living Arrangements: Alone Available Help at Discharge: Family;Friend(s);Available 24 hours/day         Home Equipment: Bedside commode;Tub bench      Prior Function Level of Independence: Independent           PT Goals (current goals can now be found in the care plan section) Acute Rehab PT Goals Patient Stated Goal: Regain IND PT Goal Formulation: With patient Time For Goal Achievement: 04/11/16 Potential to Achieve Goals: Good Progress towards PT goals: Progressing toward goals    Frequency    7X/week      PT Plan Current plan remains appropriate    Co-evaluation             End of Session Equipment Utilized During Treatment: Gait belt;Left knee immobilizer Activity Tolerance: Patient tolerated treatment well Patient left: in chair;with call bell/phone within reach;with family/visitor present     Time: [id number] PT Time Calculation (min) (ACUTE ONLY): 14 min  Charges:  $Therapeutic Exercise: 8-22 mins                    G Codes:      Tamber Burtch 04/16/2016, 12:19 PM

## 2016-04-07 NOTE — Discharge Instructions (Addendum)
° °Dr. Frank Aluisio °Total Joint Specialist °[facility] °[address] °[place], [place] [address] °[phone] ° °TOTAL KNEE REPLACEMENT POSTOPERATIVE DIRECTIONS ° °Knee Rehabilitation, Guidelines Following Surgery  °Results after knee surgery are often greatly improved when you follow the exercise, range of motion and muscle strengthening exercises prescribed by your doctor. Safety measures are also important to protect the knee from further injury. Any time any of these exercises cause you to have increased pain or swelling in your knee joint, decrease the amount until you are comfortable again and slowly increase them. If you have problems or questions, call your caregiver or physical therapist for advice.  ° °HOME CARE INSTRUCTIONS  °Remove items at home which could result in a fall. This includes throw rugs or furniture in walking pathways.  °· ICE to the affected knee every three hours for 30 minutes at a time and then as needed for pain and swelling.  Continue to use ice on the knee for pain and swelling from surgery. You may notice swelling that will progress down to the foot and ankle.  This is normal after surgery.  Elevate the leg when you are not up walking on it.   °· Continue to use the breathing machine which will help keep your temperature down.  It is common for your temperature to cycle up and down following surgery, especially at night when you are not up moving around and exerting yourself.  The breathing machine keeps your lungs expanded and your temperature down. °· Do not place pillow under knee, focus on keeping the knee straight while resting ° °DIET °You may resume your previous home diet once your are discharged from the hospital. ° °DRESSING / WOUND CARE / SHOWERING °You may shower 3 days after surgery, but keep the wounds dry during showering.  You may use an occlusive plastic wrap (Press'n Seal for example), NO SOAKING/SUBMERGING IN THE BATHTUB.  If the  bandage gets wet, change with a clean dry gauze.  If the incision gets wet, pat the wound dry with a clean towel. °You may start showering once you are discharged home but do not submerge the incision under water. Just pat the incision dry and apply a dry gauze dressing on daily. °Change the surgical dressing daily and reapply a dry dressing each time. ° °ACTIVITY °Walk with your walker as instructed. °Use walker as long as suggested by your caregivers. °Avoid periods of inactivity such as sitting longer than an hour when not asleep. This helps prevent blood clots.  °You may resume a sexual relationship in one month or when given the OK by your doctor.  °You may return to work once you are cleared by your doctor.  °Do not drive a car for 6 weeks or until released by you surgeon.  °Do not drive while taking narcotics. ° °WEIGHT BEARING °Weight bearing as tolerated with assist device (walker, cane, etc) as directed, use it as long as suggested by your surgeon or therapist, typically at least 4-6 weeks. ° °POSTOPERATIVE CONSTIPATION PROTOCOL °Constipation - defined medically as fewer than three stools per week and severe constipation as less than one stool per week. ° °One of the most common issues patients have following surgery is constipation.  Even if you have a regular bowel pattern at home, your normal regimen is likely to be disrupted due to multiple reasons following surgery.  Combination of anesthesia, postoperative narcotics, change in appetite and fluid intake all can affect your bowels.    In order to avoid complications following surgery, here are some recommendations in order to help you during your recovery period. ° °Colace (docusate) - Pick up an over-the-counter form of Colace or another stool softener and take twice a day as long as you are requiring postoperative pain medications.  Take with a full glass of water daily.  If you experience loose stools or diarrhea, hold the colace until you stool forms  back up.  If your symptoms do not get better within 1 week or if they get worse, check with your doctor. ° °Dulcolax (bisacodyl) - Pick up over-the-counter and take as directed by the product packaging as needed to assist with the movement of your bowels.  Take with a full glass of water.  Use this product as needed if not relieved by Colace only.  ° °MiraLax (polyethylene glycol) - Pick up over-the-counter to have on hand.  MiraLax is a solution that will increase the amount of water in your bowels to assist with bowel movements.  Take as directed and can mix with a glass of water, juice, soda, coffee, or tea.  Take if you go more than two days without a movement. °Do not use MiraLax more than once per day. Call your doctor if you are still constipated or irregular after using this medication for 7 days in a row. ° °If you continue to have problems with postoperative constipation, please contact the office for further assistance and recommendations.  If you experience "the worst abdominal pain ever" or develop nausea or vomiting, please contact the office immediatly for further recommendations for treatment. ° °ITCHING ° If you experience itching with your medications, try taking only a single pain pill, or even half a pain pill at a time.  You can also use Benadryl over the counter for itching or also to help with sleep.  ° °TED HOSE STOCKINGS °Wear the elastic stockings on both legs for three weeks following surgery during the day but you may remove then at night for sleeping. ° °MEDICATIONS °See your medication summary on the “After Visit Summary” that the nursing staff will review with you prior to discharge.  You may have some home medications which will be placed on hold until you complete the course of blood thinner medication.  It is important for you to complete the blood thinner medication as prescribed by your surgeon.  Continue your approved medications as instructed at time of  discharge. ° °PRECAUTIONS °If you experience chest pain or shortness of breath - call 911 immediately for transfer to the hospital emergency department.  °If you develop a fever greater that 101 F, purulent drainage from wound, increased redness or drainage from wound, foul odor from the wound/dressing, or calf pain - CONTACT YOUR SURGEON.   °                                                °FOLLOW-UP APPOINTMENTS °Make sure you keep all of your appointments after your operation with your surgeon and caregivers. You should call the office at the above phone number and make an appointment for approximately two weeks after the date of your surgery or on the date instructed by your surgeon outlined in the "After Visit Summary". ° ° °RANGE OF MOTION AND STRENGTHENING EXERCISES  °Rehabilitation of the knee is important following a knee injury or   an operation. After just a few days of immobilization, the muscles of the thigh which control the knee become weakened and shrink (atrophy). Knee exercises are designed to build up the tone and strength of the thigh muscles and to improve knee motion. Often times heat used for twenty to thirty minutes before working out will loosen up your tissues and help with improving the range of motion but do not use heat for the first two weeks following surgery. These exercises can be done on a training (exercise) mat, on the floor, on a table or on a bed. Use what ever works the best and is most comfortable for you Knee exercises include:  °Leg Lifts - While your knee is still immobilized in a splint or cast, you can do straight leg raises. Lift the leg to 60 degrees, hold for 3 sec, and slowly lower the leg. Repeat 10-20 times 2-3 times daily. Perform this exercise against resistance later as your knee gets better.  °Quad and Hamstring Sets - Tighten up the muscle on the front of the thigh (Quad) and hold for 5-10 sec. Repeat this 10-20 times hourly. Hamstring sets are done by pushing the  foot backward against an object and holding for 5-10 sec. Repeat as with quad sets.  °· Leg Slides: Lying on your back, slowly slide your foot toward your buttocks, bending your knee up off the floor (only go as far as is comfortable). Then slowly slide your foot back down until your leg is flat on the floor again. °· Angel Wings: Lying on your back spread your legs to the side as far apart as you can without causing discomfort.  °A rehabilitation program following serious knee injuries can speed recovery and prevent re-injury in the future due to weakened muscles. Contact your doctor or a physical therapist for more information on knee rehabilitation.  ° °IF YOU ARE TRANSFERRED TO A SKILLED REHAB FACILITY °If the patient is transferred to a skilled rehab facility following release from the hospital, a list of the current medications will be sent to the facility for the patient to continue.  When discharged from the skilled rehab facility, please have the facility set up the patient's Home Health Physical Therapy prior to being released. Also, the skilled facility will be responsible for providing the patient with their medications at time of release from the facility to include their pain medication, the muscle relaxants, and their blood thinner medication. If the patient is still at the rehab facility at time of the two week follow up appointment, the skilled rehab facility will also need to assist the patient in arranging follow up appointment in our office and any transportation needs. ° °MAKE SURE YOU:  °Understand these instructions.  °Get help right away if you are not doing well or get worse.  ° ° °Pick up stool softner and laxative for home use following surgery while on pain medications. °Do not submerge incision under water. °Please use good hand washing techniques while changing dressing each day. °May shower starting three days after surgery. °Please use a clean towel to pat the incision dry following  showers. °Continue to use ice for pain and swelling after surgery. °Do not use any lotions or creams on the incision until instructed by your surgeon. ° °Take Xarelto for two and a half more weeks, then discontinue Xarelto. °Once the patient has completed the Xarelto, they may resume the 81 mg Aspirin. ° ° °Information on my medicine - XARELTO® (Rivaroxaban) ° °  This medication education was reviewed with me or my healthcare representative as part of my discharge preparation.  The pharmacist that spoke with me during my hospital stay was:  Nicole Dunn, Student-PharmD ° °Why was Xarelto® prescribed for you? °Xarelto® was prescribed for you to reduce the risk of blood clots forming after orthopedic surgery. The medical term for these abnormal blood clots is venous thromboembolism (VTE). ° °What do you need to know about xarelto® ? °Take your Xarelto® ONCE DAILY at the same time every day. °You may take it either with or without food. ° °If you have difficulty swallowing the tablet whole, you may crush it and mix in applesauce just prior to taking your dose. ° °Take Xarelto® exactly as prescribed by your doctor and DO NOT stop taking Xarelto® without talking to the doctor who prescribed the medication.  Stopping without other VTE prevention medication to take the place of Xarelto® may increase your risk of developing a clot. ° °After discharge, you should have regular check-up appointments with your healthcare provider that is prescribing your Xarelto®.   ° °What do you do if you miss a dose? °If you miss a dose, take it as soon as you remember on the same day then continue your regularly scheduled once daily regimen the next day. Do not take two doses of Xarelto® on the same day.  ° °Important Safety Information °A possible side effect of Xarelto® is bleeding. You should call your healthcare provider right away if you experience any of the following: °? Bleeding from an injury or your nose that does not  stop. °? Unusual colored urine (red or dark brown) or unusual colored stools (red or black). °? Unusual bruising for unknown reasons. °? A serious fall or if you hit your head (even if there is no bleeding). ° °Some medicines may interact with Xarelto® and might increase your risk of bleeding while on Xarelto®. To help avoid this, consult your healthcare provider or pharmacist prior to using any new prescription or non-prescription medications, including herbals, vitamins, non-steroidal anti-inflammatory drugs (NSAIDs) and supplements. ° °This website has more information on Xarelto®: www.xarelto.com. ° ° ° °

## 2016-04-07 NOTE — Evaluation (Signed)
Occupational Therapy Evaluation Patient Details Name: Cathy [name] MRN: [id number] DOB: 06-14-1945 Today's Date: 04/07/2016    History of Present Illness Pt s/p L TKR   Clinical Impression   This 70 year old female was admitted for the above sx. Will follow in acute setting and practice bathroom transfers.  Goals are for supervision to min A levels    Follow Up Recommendations  No OT follow up;Supervision/Assistance - 24 hour    Equipment Recommendations  Other (comment)    Recommendations for Other Services       Precautions / Restrictions Precautions Precautions: Knee;Fall Restrictions LLE Weight Bearing: Weight bearing as tolerated      Mobility Bed Mobility           Sit to supine: Min assist   General bed mobility comments: assist for LLE  Transfers                 General transfer comment: pt was up with student RN    Balance                                            ADL Overall ADL's : Needs assistance/impaired             Lower Body Bathing: Minimal assistance;Sit to/from stand       Lower Body Dressing: Moderate assistance;Sit to/from stand                 General ADL Comments: pt was walking with Student RN; needed cues for safety with stand to sit. Reinforced safety:  Feeling surface behind her, placement of UE/LE when sitting standing, and keeping walker in front of her when turning to retrieve items.  Pt will have 24/7 for a week. She had multiple questions for OT.  Pt has a tub bench; will return to review this with her.       Vision     Perception     Praxis      Pertinent Vitals/Pain Pain Score: 5  Pain Location: L knee Pain Descriptors / Indicators: Aching Pain Intervention(s): Limited activity within patient's tolerance;Monitored during session;Premedicated before session;Repositioned     Hand Dominance     Extremity/Trunk Assessment Upper Extremity Assessment Upper Extremity  Assessment: Overall WFL for tasks assessed           Communication Communication Communication: No difficulties   Cognition Arousal/Alertness: Awake/alert Behavior During Therapy: WFL for tasks assessed/performed Overall Cognitive Status: Within Functional Limits for tasks assessed                     General Comments       Exercises       Shoulder Instructions      Home Living Family/patient expects to be discharged to:: Private residence Living Arrangements: Alone Available Help at Discharge: Family;Friend(s);Available 24 hours/day               Bathroom Shower/Tub: Tub/shower unit Shower/tub characteristics: Architectural technologist: Standard     Home Equipment: Bedside commode;Tub bench          Prior Functioning/Environment Level of Independence: Independent                 OT Problem List: Decreased strength;Decreased knowledge of use of DME or AE;Pain   OT Treatment/Interventions: Self-care/ADL training;DME and/or AE instruction;Patient/family education  OT Goals(Current goals can be found in the care plan section) Acute Rehab OT Goals Patient Stated Goal: Regain IND OT Goal Formulation: With patient Time For Goal Achievement: 04/14/16 Potential to Achieve Goals: Good ADL Goals Pt Will Transfer to Toilet: with supervision;bedside commode;ambulating Pt Will Perform Tub/Shower Transfer: Tub transfer;ambulating;tub bench;with min assist  OT Frequency: Min 2X/week   Barriers to D/C:            Co-evaluation              End of Session    Activity Tolerance: Patient tolerated treatment well Patient left: in bed;with call bell/phone within reach   Time: 1045-1104 OT Time Calculation (min): 19 min Charges:  OT General Charges $OT Visit: 1 Procedure OT Evaluation $OT Eval Low Complexity: 1 Procedure G-Codes:    Marlane Hirschmann 17-Apr-2016, 11:40 AM Lesle Chris, OTR/L [phone] 17-Apr-2016

## 2016-04-07 NOTE — Progress Notes (Signed)
   Subjective: 1 Day Post-Op Procedure(s) (LRB): LEFT TOTAL KNEE ARTHROPLASTY (Left) Patient reports pain as mild.   Patient seen in rounds with Dr. Wynelle Link.  Patient sitting up in chair. Patient is well, but has had some minor complaints of pain in the knee, requiring pain medications and not able to perform SLR yet.  We will start therapy today.  Plan is to go Home after hospital stay with sister and friends.  Objective: Vital signs in last 24 hours: Temp:  [97.5 F (36.4 C)-98 F (36.7 C)] 97.9 F (36.6 C) (10/26 0725) Pulse Rate:  [60-82] 78 (10/26 0725) Resp:  [14-21] 16 (10/26 0725) BP: (114-148)/(56-73) 143/73 (10/26 0725) SpO2:  [93 %-100 %] 93 % (10/26 0725) Weight:  [75.3 kg (166 lb)] 75.3 kg (166 lb) (10/25 1330)  Intake/Output from previous day:  Intake/Output Summary (Last 24 hours) at 04/07/16 0837 Last data filed at 04/07/16 0815  Gross per 24 hour  Intake          4678.75 ml  Output             4530 ml  Net           148.75 ml    Intake/Output this shift: Total I/O In: 120 [P.O.:120] Out: -   Labs:  Recent Labs  04/07/16 0425  HGB 12.1    Recent Labs  04/07/16 0425  WBC 17.5*  RBC 4.11  HCT 36.8  PLT 239    Recent Labs  04/07/16 0425  NA 139  K 4.3  CL 108  CO2 25  BUN 15  CREATININE 0.85  GLUCOSE 152*  CALCIUM 8.6*   No results for input(s): LABPT, INR in the last 72 hours.  EXAM General - Patient is Alert, Appropriate and Oriented Extremity - Neurovascular intact Sensation intact distally Dorsiflexion/Plantar flexion intact Dressing - dressing C/D/I Motor Function - intact, moving foot and toes well on exam.  Hemovac pulled without difficulty.  Past Medical History:  Diagnosis Date  . Abnormal glucose   . Allergy   . Asthma    Allergy induced  . Benign breast cyst in female   . GERD (gastroesophageal reflux disease)   . Hilar lymphadenopathy 08/01/2011  . History of nuclear stress test 2009   Treadmill and Stress  Myoview- no CAD  . Hyperlipidemia   . Hypertension   . Lymphadenopathy of left cervical region 08/01/2011  . Nephrolithiasis 1984  . Osteopenia   . PONV (postoperative nausea and vomiting)   . Post-menopause   . Pre-diabetes    borderline  . 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 Vision changes     Assessment/Plan: 1 Day Post-Op Procedure(s) (LRB): LEFT TOTAL KNEE ARTHROPLASTY (Left) Principal Problem:   OA (osteoarthritis) of knee  Estimated body mass index is 28.49 kg/m as calculated from the following:   Height as of this encounter: 5\' 4"  (1.626 m).   Weight as of this encounter: 75.3 kg (166 lb). Advance diet Up with therapy Plan for discharge tomorrow Discharge home with home health  DVT Prophylaxis - Xarelto Weight-Bearing as tolerated to left leg D/C O2 and Pulse OX and try on Room Air HGB - 12.1  Arlee Muslim, PA-C Orthopaedic Surgery 04/07/2016, 8:37 AM

## 2016-04-07 NOTE — Progress Notes (Signed)
Physical Therapy Treatment Patient Details Name: Cathy [name] MRN: [id number] DOB: August 21, 1945 Today's Date: 04/07/2016    History of Present Illness Pt s/p L TKR    PT Comments    Pt very motivated and progressing well with mobility.  Follow Up Recommendations  Home health PT     Equipment Recommendations  None recommended by PT    Recommendations for Other Services OT consult     Precautions / Restrictions Precautions Precautions: Knee;Fall Restrictions Weight Bearing Restrictions: No LLE Weight Bearing: Weight bearing as tolerated    Mobility  Bed Mobility           Sit to supine: Min assist   General bed mobility comments: NT - Pt OOB and requests back to chair  Transfers Overall transfer level: Needs assistance Equipment used: Rolling walker (2 wheeled) Transfers: Sit to/from Stand Sit to Stand: Min assist         General transfer comment: cues for LE management and use of UEs to self assist  Ambulation/Gait Ambulation/Gait assistance: Min assist Ambulation Distance (Feet): 58 Feet Assistive device: Rolling walker (2 wheeled) Gait Pattern/deviations: Step-to pattern;Decreased step length - right;Decreased step length - left;Shuffle;Trunk flexed Gait velocity: decr Gait velocity interpretation: Below normal speed for age/gender General Gait Details: cues for sequence, posture and position from RW.   Stairs            Wheelchair Mobility    Modified Rankin (Stroke Patients Only)       Balance Overall balance assessment: Needs assistance Sitting-balance support: No upper extremity supported;Feet supported Sitting balance-Leahy Scale: Good     Standing balance support: Bilateral upper extremity supported Standing balance-Leahy Scale: Fair                      Cognition Arousal/Alertness: Awake/alert Behavior During Therapy: WFL for tasks assessed/performed Overall Cognitive Status: Within Functional Limits for tasks  assessed                      Exercises Total Joint Exercises Ankle Circles/Pumps: AROM;Both;15 reps;Supine Quad Sets: AROM;Both;10 reps;Supine Heel Slides: AAROM;Left Straight Leg Raises: AAROM;Left;10 reps;Supine    General Comments        Pertinent Vitals/Pain Pain Assessment: 0-10 Pain Score: 5  Pain Location: L knee Pain Descriptors / Indicators: Aching;Sore Pain Intervention(s): Limited activity within patient's tolerance;Monitored during session;Premedicated before session;Ice applied    Home Living Family/patient expects to be discharged to:: Private residence Living Arrangements: Alone Available Help at Discharge: Family;Friend(s);Available 24 hours/day         Home Equipment: Bedside commode;Tub bench      Prior Function Level of Independence: Independent          PT Goals (current goals can now be found in the care plan section) Acute Rehab PT Goals Patient Stated Goal: Regain IND PT Goal Formulation: With patient Time For Goal Achievement: 04/11/16 Potential to Achieve Goals: Good Progress towards PT goals: Progressing toward goals    Frequency    7X/week      PT Plan Current plan remains appropriate    Co-evaluation             End of Session Equipment Utilized During Treatment: Gait belt;Left knee immobilizer Activity Tolerance: Patient tolerated treatment well Patient left: in chair;with call bell/phone within reach;with family/visitor present     Time: [id number] PT Time Calculation (min) (ACUTE ONLY): 16 min  Charges:  $Gait Training: 8-22 mins $Therapeutic Exercise: 8-22 mins  G Codes:      Cathy [name] 04-19-16, 12:27 PM

## 2016-04-07 NOTE — Progress Notes (Signed)
Physical Therapy Treatment Patient Details Name: Cathy [name] MRN: [id number] DOB: 18-Aug-1945 Today's Date: 04/07/2016    History of Present Illness Pt s/p L TKR    PT Comments    Pt motivated, progressing well and hopeful for dc home tomorrow.  Follow Up Recommendations  Home health PT     Equipment Recommendations  None recommended by PT    Recommendations for Other Services OT consult     Precautions / Restrictions Precautions Precautions: Knee;Fall Restrictions Weight Bearing Restrictions: No LLE Weight Bearing: Weight bearing as tolerated    Mobility  Bed Mobility Overal bed mobility: Needs Assistance Bed Mobility: Supine to Sit;Sit to Supine     Supine to sit: Min assist Sit to supine: Min assist   General bed mobility comments: cues for sequence and min assist to manage L LE  Transfers Overall transfer level: Needs assistance Equipment used: Rolling walker (2 wheeled) Transfers: Sit to/from Stand Sit to Stand: Min assist         General transfer comment: cues for LE management and use of UEs to self assist  Ambulation/Gait Ambulation/Gait assistance: Min assist Ambulation Distance (Feet): 123 Feet Assistive device: Rolling walker (2 wheeled) Gait Pattern/deviations: Step-to pattern;Step-through pattern;Decreased step length - right;Decreased step length - left;Shuffle;Trunk flexed Gait velocity: decr Gait velocity interpretation: Below normal speed for age/gender General Gait Details: cues for sequence, posture and position from RW.   Stairs            Wheelchair Mobility    Modified Rankin (Stroke Patients Only)       Balance Overall balance assessment: Needs assistance Sitting-balance support: No upper extremity supported;Feet supported Sitting balance-Leahy Scale: Good     Standing balance support: Bilateral upper extremity supported Standing balance-Leahy Scale: Fair                      Cognition  Arousal/Alertness: Awake/alert Behavior During Therapy: WFL for tasks assessed/performed Overall Cognitive Status: Within Functional Limits for tasks assessed                      Exercises Total Joint Exercises Ankle Circles/Pumps: AROM;Both;15 reps;Supine Quad Sets: AROM;Both;10 reps;Supine Heel Slides: AAROM;Left;15 reps;Supine Straight Leg Raises: AAROM;Left;10 reps;Supine    General Comments        Pertinent Vitals/Pain Pain Assessment: 0-10 Pain Score: 5  Pain Location: L knee Pain Descriptors / Indicators: Aching;Sore Pain Intervention(s): Limited activity within patient's tolerance;Monitored during session;Premedicated before session;Ice applied    Home Living Family/patient expects to be discharged to:: Private residence Living Arrangements: Alone Available Help at Discharge: Family;Friend(s);Available 24 hours/day         Home Equipment: Bedside commode;Tub bench      Prior Function Level of Independence: Independent          PT Goals (current goals can now be found in the care plan section) Acute Rehab PT Goals Patient Stated Goal: Regain IND PT Goal Formulation: With patient Time For Goal Achievement: 04/11/16 Potential to Achieve Goals: Good Progress towards PT goals: Progressing toward goals    Frequency    7X/week      PT Plan Current plan remains appropriate    Co-evaluation             End of Session Equipment Utilized During Treatment: Gait belt;Left knee immobilizer Activity Tolerance: Patient tolerated treatment well Patient left: in bed;with call bell/phone within reach;with family/visitor present     Time: 1345-1411 PT Time Calculation (min) (  ACUTE ONLY): 26 min  Charges:  $Gait Training: 8-22 mins $Therapeutic Exercise: 8-22 mins                    G Codes:      Cathy [name] 04/17/2016, 2:15 PM

## 2016-04-08 LAB — CBC
HEMATOCRIT: 36 % (ref 36.0–46.0)
HEMOGLOBIN: 11.7 g/dL — AB (ref 12.0–15.0)
MCH: 29.5 pg (ref 26.0–34.0)
MCHC: 32.5 g/dL (ref 30.0–36.0)
MCV: 90.7 fL (ref 78.0–100.0)
Platelets: 237 10*3/uL (ref 150–400)
RBC: 3.97 MIL/uL (ref 3.87–5.11)
RDW: 13.4 % (ref 11.5–15.5)
WBC: 16.1 10*3/uL — ABNORMAL HIGH (ref 4.0–10.5)

## 2016-04-08 LAB — BASIC METABOLIC PANEL
Anion gap: 5 (ref 5–15)
BUN: 17 mg/dL (ref 6–20)
CHLORIDE: 104 mmol/L (ref 101–111)
CO2: 29 mmol/L (ref 22–32)
CREATININE: 0.58 mg/dL (ref 0.44–1.00)
Calcium: 9 mg/dL (ref 8.9–10.3)
GFR calc Af Amer: >60 mL/min (ref >60)
GFR calc non Af Amer: >60 mL/min (ref >60)
GLUCOSE: 121 mg/dL — AB (ref 65–99)
POTASSIUM: 4.4 mmol/L (ref 3.5–5.1)
SODIUM: 138 mmol/L (ref 135–145)

## 2016-04-08 MED ORDER — TRAMADOL HCL 50 MG PO TABS: 50.0000 mg | ORAL_TABLET | Freq: Four times a day (QID) | ORAL | 1 refills | Status: DC | PRN

## 2016-04-08 MED ORDER — RIVAROXABAN 10 MG PO TABS: 10.0000 mg | ORAL_TABLET | Freq: Every day | ORAL | 0 refills | Status: DC

## 2016-04-08 MED ORDER — METHOCARBAMOL 500 MG PO TABS: 500.0000 mg | ORAL_TABLET | Freq: Four times a day (QID) | ORAL | 0 refills | Status: DC | PRN

## 2016-04-08 MED ORDER — OXYCODONE HCL 5 MG PO TABS: 5.0000 mg | ORAL_TABLET | ORAL | 0 refills | Status: DC | PRN

## 2016-04-08 NOTE — Progress Notes (Signed)
   Subjective: 2 Days Post-Op Procedure(s) (LRB): LEFT TOTAL KNEE ARTHROPLASTY (Left) Patient reports pain as mild.   Patient seen in rounds for Dr. Wynelle Link.  She is doing well this morning and already able to perform SLR's today. Patient is well, and has had no acute complaints or problems Patient is ready to go home later today after lunch when her ride arrives.  Objective: Vital signs in last 24 hours: Temp:  [97.7 F (36.5 C)-98 F (36.7 C)] 98 F (36.7 C) (10/27 0507) Pulse Rate:  [70-74] 72 (10/27 0507) Resp:  [16-18] 18 (10/27 0507) BP: (120-133)/(54-80) 132/80 (10/27 0507) SpO2:  [96 %-97 %] 97 % (10/27 0507)  Intake/Output from previous day:  Intake/Output Summary (Last 24 hours) at 04/08/16 0758 Last data filed at 04/08/16 0200  Gross per 24 hour  Intake              840 ml  Output             2150 ml  Net            -1310 ml    Intake/Output this shift: No intake/output data recorded.  Labs:  Recent Labs  04/07/16 0425 04/08/16 0449  HGB 12.1 11.7*    Recent Labs  04/07/16 0425 04/08/16 0449  WBC 17.5* 16.1*  RBC 4.11 3.97  HCT 36.8 36.0  PLT 239 237    Recent Labs  04/07/16 0425 04/08/16 0449  NA 139 138  K 4.3 4.4  CL 108 104  CO2 25 29  BUN 15 17  CREATININE 0.85 0.58  GLUCOSE 152* 121*  CALCIUM 8.6* 9.0   No results for input(s): LABPT, INR in the last 72 hours.  EXAM: General - Patient is Alert, Appropriate and Oriented Extremity - Neurovascular intact Sensation intact distally Dorsiflexion/Plantar flexion intact Incision - clean, dry, no drainage, healing Motor Function - intact, moving foot and toes well on exam.   Assessment/Plan: 2 Days Post-Op Procedure(s) (LRB): LEFT TOTAL KNEE ARTHROPLASTY (Left) Procedure(s) (LRB): LEFT TOTAL KNEE ARTHROPLASTY (Left) Past Medical History:  Diagnosis Date  . Abnormal glucose   . Allergy   . Asthma    Allergy induced  . Benign breast cyst in female   . GERD (gastroesophageal  reflux disease)   . Hilar lymphadenopathy 08/01/2011  . History of nuclear stress test 2009   Treadmill and Stress Myoview- no CAD  . Hyperlipidemia   . Hypertension   . Lymphadenopathy of left cervical region 08/01/2011  . Nephrolithiasis 1984  . Osteopenia   . PONV (postoperative nausea and vomiting)   . Post-menopause   . Pre-diabetes    borderline  . 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 Vision changes    Principal Problem:   OA (osteoarthritis) of knee  Estimated body mass index is 28.49 kg/m as calculated from the following:   Height as of this encounter: 5\' 4"  (1.626 m).   Weight as of this encounter: 75.3 kg (166 lb). Up with therapy Discharge home with home health Diet - Cardiac diet and Diabetic diet Follow up - in 2 weeks Activity - WBAT Disposition - Home Condition Upon Discharge - Good D/C Meds - See DC Summary DVT Prophylaxis - Xarelto  Arlee Muslim, PA-C Orthopaedic Surgery 04/08/2016, 7:58 AM

## 2016-04-08 NOTE — Progress Notes (Signed)
Pt to d/c home with [facility]. No DME needs . AVS reviewed and "My Chart" discussed with pt. Pt capable of verbalizing medications, dressing changes, signs and symptoms of infection, and follow-up appointments. Remains hemodynamically stable. No signs and symptoms of distress. Educated pt to return to ER in the case of SOB, dizziness, or chest pain.

## 2016-04-08 NOTE — Progress Notes (Signed)
Physical Therapy Treatment Patient Details Name: Cathy [name] MRN: [id number] DOB: 25-Jul-1945 Today's Date: 04/08/2016    History of Present Illness Pt s/p L TKR    PT Comments    Pt progressing well with mobility and eager for dc to home.  Sister present and reviewed therex, stairs, transfers and ambulation with written information provided.  Follow Up Recommendations  Home health PT     Equipment Recommendations  None recommended by PT    Recommendations for Other Services OT consult     Precautions / Restrictions Precautions Precautions: Knee;Fall Required Braces or Orthoses: Knee Immobilizer - Left Knee Immobilizer - Left: Discontinue once straight leg raise with < 10 degree lag Restrictions Weight Bearing Restrictions: No LLE Weight Bearing: Weight bearing as tolerated    Mobility  Bed Mobility Overal bed mobility: Needs Assistance Bed Mobility: Supine to Sit;Sit to Supine     Supine to sit: Supervision Sit to supine: Supervision   General bed mobility comments: Increased time and cues for sequence and use of R LE to self assist  Transfers Overall transfer level: Needs assistance Equipment used: Rolling walker (2 wheeled) Transfers: Sit to/from Stand Sit to Stand: Supervision         General transfer comment: one cue for hand placement  Ambulation/Gait Ambulation/Gait assistance: Supervision Ambulation Distance (Feet): 60 Feet Assistive device: Rolling walker (2 wheeled) Gait Pattern/deviations: Step-to pattern;Decreased step length - right;Decreased step length - left;Shuffle;Trunk flexed Gait velocity: decr Gait velocity interpretation: Below normal speed for age/gender General Gait Details: min cues for sequence, posture and position from RW.   Stairs Stairs: Yes Stairs assistance: Min assist Stair Management: No rails;Backwards;Forwards;With walker Number of Stairs: 4 General stair comments: single step twice fwd and twice bkwd with cues  for sequence and RW/foot placement  Wheelchair Mobility    Modified Rankin (Stroke Patients Only)       Balance                                    Cognition Arousal/Alertness: Awake/alert Behavior During Therapy: WFL for tasks assessed/performed Overall Cognitive Status: Within Functional Limits for tasks assessed                      Exercises Total Joint Exercises Ankle Circles/Pumps: AROM;Both;15 reps;Supine Quad Sets: AROM;Both;Supine;15 reps Heel Slides: AAROM;Left;Supine;15 reps Straight Leg Raises: Left;Supine;AAROM;AROM;15 reps    General Comments        Pertinent Vitals/Pain Pain Assessment: 0-10 Pain Score: 5  Pain Location: L knee Pain Descriptors / Indicators: Aching;Sore Pain Intervention(s): Limited activity within patient's tolerance;Monitored during session;Premedicated before session;Ice applied    Home Living                      Prior Function            PT Goals (current goals can now be found in the care plan section) Acute Rehab PT Goals Patient Stated Goal: Regain IND PT Goal Formulation: With patient Time For Goal Achievement: 04/11/16 Potential to Achieve Goals: Good Progress towards PT goals: Progressing toward goals    Frequency    7X/week      PT Plan Current plan remains appropriate    Co-evaluation             End of Session Equipment Utilized During Treatment: Gait belt Activity Tolerance: Patient tolerated treatment well Patient left: in  bed;with call bell/phone within reach;with family/visitor present     Time: 1335-1413 PT Time Calculation (min) (ACUTE ONLY): 38 min  Charges:  $Gait Training: 8-22 mins $Therapeutic Exercise: 8-22 mins $Therapeutic Activity: 8-22 mins                    G Codes:      Pellegrino Kennard 05/04/16, 2:26 PM

## 2016-04-08 NOTE — Progress Notes (Signed)
Physical Therapy Treatment Patient Details Name: Cathy [name] MRN: [id number] DOB: 1945/10/28 Today's Date: 04/08/2016    History of Present Illness Pt s/p L TKR    PT Comments    Pt progressing well with mobility and eager for return home this pm.  Follow Up Recommendations  Home health PT     Equipment Recommendations  None recommended by PT    Recommendations for Other Services OT consult     Precautions / Restrictions Precautions Precautions: Knee;Fall Precaution Comments: no KI used Required Braces or Orthoses: Knee Immobilizer - Left Knee Immobilizer - Left: Discontinue once straight leg raise with < 10 degree lag (Pt performed IND SLR this am) Restrictions Weight Bearing Restrictions: No LLE Weight Bearing: Weight bearing as tolerated    Mobility  Bed Mobility               General bed mobility comments: OOB  Transfers Overall transfer level: Needs assistance Equipment used: Rolling walker (2 wheeled) Transfers: Sit to/from Stand Sit to Stand: Supervision         General transfer comment: one cue for hand placement  Ambulation/Gait Ambulation/Gait assistance: Min guard;Supervision Ambulation Distance (Feet): 150 Feet Assistive device: Rolling walker (2 wheeled) Gait Pattern/deviations: Step-to pattern;Step-through pattern;Decreased step length - right;Decreased step length - left;Shuffle Gait velocity: decr Gait velocity interpretation: Below normal speed for age/gender General Gait Details: min cues for sequence, posture and position from RW.   Stairs Stairs: Yes Stairs assistance: Min assist Stair Management: No rails;Step to pattern;Backwards;Forwards;With walker Number of Stairs: 3 General stair comments: single step twice fwd and once bkwd with cues for sequence and RW/foot placement  Wheelchair Mobility    Modified Rankin (Stroke Patients Only)       Balance                                    Cognition  Arousal/Alertness: Awake/alert Behavior During Therapy: WFL for tasks assessed/performed Overall Cognitive Status: Within Functional Limits for tasks assessed                      Exercises Total Joint Exercises Ankle Circles/Pumps: AROM;Both;15 reps;Supine Quad Sets: AROM;Both;Supine;15 reps Heel Slides: AAROM;Left;Supine;20 reps Straight Leg Raises: Left;Supine;AAROM;AROM;20 reps    General Comments        Pertinent Vitals/Pain Pain Assessment: 0-10 Pain Score: 4  Pain Location: L knee Pain Descriptors / Indicators: Aching;Sore Pain Intervention(s): Limited activity within patient's tolerance;Monitored during session;Premedicated before session;Ice applied    Home Living                      Prior Function            PT Goals (current goals can now be found in the care plan section) Acute Rehab PT Goals Patient Stated Goal: Regain IND PT Goal Formulation: With patient Time For Goal Achievement: 04/11/16 Potential to Achieve Goals: Good Progress towards PT goals: Progressing toward goals    Frequency    7X/week      PT Plan Current plan remains appropriate    Co-evaluation             End of Session Equipment Utilized During Treatment: Gait belt Activity Tolerance: Patient tolerated treatment well Patient left: in chair;with call bell/phone within reach;with family/visitor present     Time: [id number] PT Time Calculation (min) (ACUTE ONLY): 36 min  Charges:  $  Gait Training: 8-22 mins $Therapeutic Exercise: 8-22 mins                    G Codes:      Henryk Ursin 04/28/2016, 12:50 PM

## 2016-04-08 NOTE — Progress Notes (Signed)
Occupational Therapy Treatment Patient Details Name: Cathy [name] MRN: [id number] DOB: 1945-12-14 Today's Date: 04/08/2016    History of present illness Pt s/p L TKR   OT comments  Goals met this session.  No further OT needs  Follow Up Recommendations  No OT follow up;Supervision/Assistance - 24 hour    Equipment Recommendations  None recommended by OT    Recommendations for Other Services      Precautions / Restrictions Precautions Precautions: Knee;Fall Precaution Comments: no KI used Restrictions LLE Weight Bearing: Weight bearing as tolerated       Mobility Bed Mobility               General bed mobility comments: oob  Transfers   Equipment used: Rolling walker (2 wheeled)   Sit to Stand: Supervision         General transfer comment: one cue for hand placement    Balance                                   ADL                           Toilet Transfer: Supervision/safety;Ambulation;BSC;RW   Toileting- Clothing Manipulation and Hygiene: Supervision/safety;Bed level   Tub/ Shower Transfer: Tub transfer;Tub bench;Rolling walker     General ADL Comments: practiced bathroom transfers.  One cue for hand placement when standing      Vision                     Perception     Praxis      Cognition   Behavior During Therapy: WFL for tasks assessed/performed Overall Cognitive Status: Within Functional Limits for tasks assessed                       Extremity/Trunk Assessment               Exercises     Shoulder Instructions       General Comments      Pertinent Vitals/ Pain       Pain Score: 4  Pain Location: L knee Pain Descriptors / Indicators: Sore Pain Intervention(s): Limited activity within patient's tolerance;Monitored during session;Premedicated before session;Repositioned;Ice applied  Home Living                                          Prior  Functioning/Environment              Frequency           Progress Toward Goals  OT Goals(current goals can now be found in the care plan section)  Progress towards OT goals: Goals met/education completed, patient discharged from [place] of Session     Activity Tolerance Patient tolerated treatment well   Patient Left in chair;with call bell/phone within reach   Nurse Communication          Time: 8921-1941 OT Time Calculation (min): 22 min  Charges: OT General Charges $OT Visit: 1 Procedure OT Treatments $Self Care/Home Management : 8-22 mins  Rima Blizzard 04/08/2016, 9:54 AM  Simone Curia  Frederico Hamman, [place] 04/08/2016

## 2016-04-08 NOTE — Discharge Summary (Signed)
Physician Discharge Summary   Patient ID: Cathy [name] MRN: [id number] DOB/AGE: 1946/01/31 70 y.o.  Admit date: 04/06/2016 Discharge date: 04-08-2016  Primary Diagnosis:  Osteoarthritis  Left knee(s)  Admission Diagnoses:  Past Medical History:  Diagnosis Date  . Abnormal glucose   . Allergy   . Asthma    Allergy induced  . Benign breast cyst in female   . GERD (gastroesophageal reflux disease)   . Hilar lymphadenopathy 08/01/2011  . History of nuclear stress test 2009   Treadmill and Stress Myoview- no CAD  . Hyperlipidemia   . Hypertension   . Lymphadenopathy of left cervical region 08/01/2011  . Nephrolithiasis 1984  . Osteopenia   . PONV (postoperative nausea and vomiting)   . Post-menopause   . Pre-diabetes    borderline  . 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 Vision changes    Discharge Diagnoses:   Principal Problem:   OA (osteoarthritis) of knee  Estimated body mass index is 28.49 kg/m as calculated from the following:   Height as of this encounter: '5\' 4"'$  (1.626 m).   Weight as of this encounter: 75.3 kg (166 lb).  Procedure:  Procedure(s) (LRB): LEFT TOTAL KNEE ARTHROPLASTY (Left)   Consults: None  HPI: Cathy [name] is a 70 y.o. year old female with end stage OA of her left knee with progressively worsening pain and dysfunction. She has constant pain, with activity and at rest and significant functional deficits with difficulties even with ADLs. She has had extensive non-op management including analgesics, injections of cortisone and viscosupplements, and home exercise program, but remains in significant pain with significant dysfunction. Radiographs show bone on bone arthritis medial and patellofemoral. She presents now for left Total Knee Arthroplasty.    Laboratory Data: Admission on 04/06/2016  Component Date Value Ref Range Status  . MRSA, PCR 03/29/2016 INVALID RESULTS, SPECIMEN SENT FOR CULTURE* NEGATIVE Final  . Staphylococcus aureus  03/29/2016 INVALID RESULTS, SPECIMEN SENT FOR CULTURE* NEGATIVE Final   Comment:        The Xpert SA Assay (FDA approved for NASAL specimens in patients over 85 years of age), is one component of a comprehensive surveillance program.  Test performance has been validated by [facility] for patients greater than or equal to 75 year old. It is not intended to diagnose infection nor to guide or monitor treatment. INFORMED BEVERLY, RN '@1233'$  ON 10.17.17 BY MCCOY,N.   . aPTT 03/29/2016 29  24 - 36 seconds Final  . WBC 03/29/2016 7.7  4.0 - 10.5 K/uL Final  . RBC 03/29/2016 4.68  3.87 - 5.11 MIL/uL Final  . Hemoglobin 03/29/2016 13.4  12.0 - 15.0 g/dL Final  . HCT 03/29/2016 40.8  36.0 - 46.0 % Final  . MCV 03/29/2016 87.2  78.0 - 100.0 fL Final  . MCH 03/29/2016 28.6  26.0 - 34.0 pg Final  . MCHC 03/29/2016 32.8  30.0 - 36.0 g/dL Final  . RDW 03/29/2016 13.2  11.5 - 15.5 % Final  . Platelets 03/29/2016 249  150 - 400 K/uL Final  . Sodium 03/29/2016 138  135 - 145 mmol/L Final  . Potassium 03/29/2016 4.8  3.5 - 5.1 mmol/L Final  . Chloride 03/29/2016 105  101 - 111 mmol/L Final  . CO2 03/29/2016 27  22 - 32 mmol/L Final  . Glucose, Bld 03/29/2016 124* 65 - 99 mg/dL Final  . BUN 03/29/2016 15  6 - 20 mg/dL Final  . Creatinine, Ser 03/29/2016  0.73  0.44 - 1.00 mg/dL Final  . Calcium 03/29/2016 9.4  8.9 - 10.3 mg/dL Final  . Total Protein 03/29/2016 7.6  6.5 - 8.1 g/dL Final  . Albumin 03/29/2016 4.5  3.5 - 5.0 g/dL Final  . AST 03/29/2016 30  15 - 41 U/L Final  . ALT 03/29/2016 26  14 - 54 U/L Final  . Alkaline Phosphatase 03/29/2016 54  38 - 126 U/L Final  . Total Bilirubin 03/29/2016 0.6  0.3 - 1.2 mg/dL Final  . GFR calc non Af Amer 03/29/2016 >60  >60 mL/min Final  . GFR calc Af Amer 03/29/2016 >60  >60 mL/min Final   Comment: (NOTE) The eGFR has been calculated using the CKD EPI equation. This calculation has not been validated in all clinical situations. eGFR's persistently  <60 mL/min signify possible Chronic Kidney Disease.   . Anion gap 03/29/2016 6  5 - 15 Final  . Prothrombin Time 03/29/2016 13.1  11.4 - 15.2 seconds Final  . INR 03/29/2016 0.99   Final  . ABO/RH(D) 04/06/2016 A POS   Final  . Antibody Screen 04/06/2016 NEG   Final  . Sample Expiration 04/06/2016 04/09/2016   Final  . Extend sample reason 04/06/2016 NO TRANSFUSIONS OR PREGNANCY IN THE PAST 3 MONTHS   Final  . WBC 04/07/2016 17.5* 4.0 - 10.5 K/uL Final  . RBC 04/07/2016 4.11  3.87 - 5.11 MIL/uL Final  . Hemoglobin 04/07/2016 12.1  12.0 - 15.0 g/dL Final  . HCT 04/07/2016 36.8  36.0 - 46.0 % Final  . MCV 04/07/2016 89.5  78.0 - 100.0 fL Final  . MCH 04/07/2016 29.4  26.0 - 34.0 pg Final  . MCHC 04/07/2016 32.9  30.0 - 36.0 g/dL Final  . RDW 04/07/2016 13.1  11.5 - 15.5 % Final  . Platelets 04/07/2016 239  150 - 400 K/uL Final  . Sodium 04/07/2016 139  135 - 145 mmol/L Final  . Potassium 04/07/2016 4.3  3.5 - 5.1 mmol/L Final  . Chloride 04/07/2016 108  101 - 111 mmol/L Final  . CO2 04/07/2016 25  22 - 32 mmol/L Final  . Glucose, Bld 04/07/2016 152* 65 - 99 mg/dL Final  . BUN 04/07/2016 15  6 - 20 mg/dL Final  . Creatinine, Ser 04/07/2016 0.85  0.44 - 1.00 mg/dL Final  . Calcium 04/07/2016 8.6* 8.9 - 10.3 mg/dL Final  . GFR calc non Af Amer 04/07/2016 >60  >60 mL/min Final  . GFR calc Af Amer 04/07/2016 >60  >60 mL/min Final   Comment: (NOTE) The eGFR has been calculated using the CKD EPI equation. This calculation has not been validated in all clinical situations. eGFR's persistently <60 mL/min signify possible Chronic Kidney Disease.   . Anion gap 04/07/2016 6  5 - 15 Final  . WBC 04/08/2016 16.1* 4.0 - 10.5 K/uL Final  . RBC 04/08/2016 3.97  3.87 - 5.11 MIL/uL Final  . Hemoglobin 04/08/2016 11.7* 12.0 - 15.0 g/dL Final  . HCT 04/08/2016 36.0  36.0 - 46.0 % Final  . MCV 04/08/2016 90.7  78.0 - 100.0 fL Final  . MCH 04/08/2016 29.5  26.0 - 34.0 pg Final  . MCHC 04/08/2016  32.5  30.0 - 36.0 g/dL Final  . RDW 04/08/2016 13.4  11.5 - 15.5 % Final  . Platelets 04/08/2016 237  150 - 400 K/uL Final  . Sodium 04/08/2016 138  135 - 145 mmol/L Final  . Potassium 04/08/2016 4.4  3.5 - 5.1 mmol/L Final  .  Chloride 04/08/2016 104  101 - 111 mmol/L Final  . CO2 04/08/2016 29  22 - 32 mmol/L Final  . Glucose, Bld 04/08/2016 121* 65 - 99 mg/dL Final  . BUN 04/08/2016 17  6 - 20 mg/dL Final  . Creatinine, Ser 04/08/2016 0.58  0.44 - 1.00 mg/dL Final  . Calcium 04/08/2016 9.0  8.9 - 10.3 mg/dL Final  . GFR calc non Af Amer 04/08/2016 >60  >60 mL/min Final  . GFR calc Af Amer 04/08/2016 >60  >60 mL/min Final   Comment: (NOTE) The eGFR has been calculated using the CKD EPI equation. This calculation has not been validated in all clinical situations. eGFR's persistently <60 mL/min signify possible Chronic Kidney Disease.   Georgiann Hahn gap 04/08/2016 5  5 - 15 Final  Hospital Outpatient Visit on 03/29/2016  Component Date Value Ref Range Status  . Color, Urine 03/29/2016 YELLOW  YELLOW Final  . APPearance 03/29/2016 CLEAR  CLEAR Final  . Specific Gravity, Urine 03/29/2016 1.019  1.005 - 1.030 Final  . pH 03/29/2016 7.0  5.0 - 8.0 Final  . Glucose, UA 03/29/2016 NEGATIVE  NEGATIVE mg/dL Final  . Hgb urine dipstick 03/29/2016 NEGATIVE  NEGATIVE Final  . Bilirubin Urine 03/29/2016 NEGATIVE  NEGATIVE Final  . Ketones, ur 03/29/2016 NEGATIVE  NEGATIVE mg/dL Final  . Protein, ur 03/29/2016 NEGATIVE  NEGATIVE mg/dL Final  . Nitrite 03/29/2016 NEGATIVE  NEGATIVE Final  . Leukocytes, UA 03/29/2016 NEGATIVE  NEGATIVE Final  . Hgb A1c MFr Bld 03/30/2016 6.0* 4.8 - 5.6 % Final   Comment: (NOTE)         Pre-diabetes: 5.7 - 6.4         Diabetes: >6.4         Glycemic control for adults with diabetes: <7.0   . Mean Plasma Glucose 03/30/2016 126  mg/dL Final   Comment: (NOTE) Performed At: [facility] [place], [place] [id number] Lindon Romp MD  BS:[id number]   . ABO/RH(D) 03/29/2016 A POS   Final  . Specimen Description 04/01/2016 NOSE   Final  . Special Requests 04/01/2016 NONE   Final  . Culture 04/01/2016    Final                   Value:NO MRSA DETECTED Performed at [facility]   . Report Status 04/01/2016 04/01/2016 FINAL   Final     X-Rays:No results found.  EKG: Orders placed or performed in visit on 12/28/15  . EKG 12-Lead     Hospital Course: Cathy [name] is a 70 y.o. who was admitted to [facility] They were brought to the operating room on 04/06/2016 and underwent Procedure(s): LEFT TOTAL KNEE ARTHROPLASTY.  Patient tolerated the procedure well and was later transferred to the recovery room and then to the orthopaedic floor for postoperative care.  They were given PO and IV analgesics for pain control following their surgery.  They were given 24 hours of postoperative antibiotics of  Anti-infectives    Start     Dose/Rate Route Frequency Ordered Stop   04/06/16 1600  ceFAZolin (ANCEF) IVPB 2g/100 mL premix     2 g 200 mL/hr over 30 Minutes Intravenous Every 6 hours 04/06/16 1327 04/06/16 2153   04/06/16 0640  ceFAZolin (ANCEF) IVPB 2g/100 mL premix     2 g 200 mL/hr over 30 Minutes Intravenous On call to O.R. 04/06/16 7654 04/06/16 1009     and started on DVT  prophylaxis in the form of Xarelto.   PT and OT were ordered for total joint protocol.  Discharge planning consulted to help with postop disposition and equipment needs.  Patient had a decent night on the evening of surgery but with some pain.  They started to get up OOB with therapy on day one. Hemovac drain was pulled without difficulty.  Continued to work with therapy into day two.  Dressing was changed on day two and the incision was healing well. Patient was seen in rounds and was ready to go home later that day.  Discharge home with home health Diet - Cardiac diet and Diabetic diet Follow up - in 2 weeks Activity -  WBAT Disposition - Home Condition Upon Discharge - Good D/C Meds - See DC Summary DVT Prophylaxis - Xarelto  Discharge Instructions    Call MD / Call 911    Complete by:  As directed    If you experience chest pain or shortness of breath, CALL 911 and be transported to the hospital emergency room.  If you develope a fever above 101 F, pus (white drainage) or increased drainage or redness at the wound, or calf pain, call your surgeon's office.   Change dressing    Complete by:  As directed    Change dressing daily with sterile 4 x 4 inch gauze dressing and apply TED hose. Do not submerge the incision under water.   Constipation Prevention    Complete by:  As directed    Drink plenty of fluids.  Prune juice may be helpful.  You may use a stool softener, such as Colace (over the counter) 100 mg twice a day.  Use MiraLax (over the counter) for constipation as needed.   Diet - low sodium heart healthy    Complete by:  As directed    Diet Carb Modified    Complete by:  As directed    Discharge instructions    Complete by:  As directed    Pick up stool softner and laxative for home use following surgery while on pain medications. Do not submerge incision under water. Please use good hand washing techniques while changing dressing each day. May shower starting three days after surgery. Please use a clean towel to pat the incision dry following showers. Continue to use ice for pain and swelling after surgery. Do not use any lotions or creams on the incision until instructed by your surgeon.   Postoperative Constipation Protocol  Constipation - defined medically as fewer than three stools per week and severe constipation as less than one stool per week.  One of the most common issues patients have following surgery is constipation.  Even if you have a regular bowel pattern at home, your normal regimen is likely to be disrupted due to multiple reasons following surgery.  Combination of  anesthesia, postoperative narcotics, change in appetite and fluid intake all can affect your bowels.  In order to avoid complications following surgery, here are some recommendations in order to help you during your recovery period.  Colace (docusate) - Pick up an over-the-counter form of Colace or another stool softener and take twice a day as long as you are requiring postoperative pain medications.  Take with a full glass of water daily.  If you experience loose stools or diarrhea, hold the colace until you stool forms back up.  If your symptoms do not get better within 1 week or if they get worse, check with your doctor.  Dulcolax (  bisacodyl) - Pick up over-the-counter and take as directed by the product packaging as needed to assist with the movement of your bowels.  Take with a full glass of water.  Use this product as needed if not relieved by Colace only.   MiraLax (polyethylene glycol) - Pick up over-the-counter to have on hand.  MiraLax is a solution that will increase the amount of water in your bowels to assist with bowel movements.  Take as directed and can mix with a glass of water, juice, soda, coffee, or tea.  Take if you go more than two days without a movement. Do not use MiraLax more than once per day. Call your doctor if you are still constipated or irregular after using this medication for 7 days in a row.  If you continue to have problems with postoperative constipation, please contact the office for further assistance and recommendations.  If you experience "the worst abdominal pain ever" or develop nausea or vomiting, please contact the office immediatly for further recommendations for treatment.   Take Xarelto for two and a half more weeks, then discontinue Xarelto. Once the patient has completed the Xarelto, they may resume the 81 mg Aspirin.   Do not put a pillow under the knee. Place it under the heel.    Complete by:  As directed    Do not sit on low chairs, stoools or  toilet seats, as it may be difficult to get up from low surfaces    Complete by:  As directed    Driving restrictions    Complete by:  As directed    No driving until released by the physician.   Increase activity slowly as tolerated    Complete by:  As directed    Lifting restrictions    Complete by:  As directed    No lifting until released by the physician.   Patient may shower    Complete by:  As directed    You may shower without a dressing once there is no drainage.  Do not wash over the wound.  If drainage remains, do not shower until drainage stops.   TED hose    Complete by:  As directed    Use stockings (TED hose) for 3 weeks on both leg(s).  You may remove them at night for sleeping.   Weight bearing as tolerated    Complete by:  As directed    Laterality:  left   Extremity:  Lower       Medication List    STOP taking these medications   aspirin 81 MG tablet   calcium carbonate 600 MG Tabs tablet Commonly known as:  OS-CAL   Cinnamon 500 MG Tabs   EPINEPHrine 0.3 mg/0.3 mL Soaj injection Commonly known as:  EPI-PEN   Fish Oil 1000 MG Caps   glucosamine-chondroitin 500-400 MG tablet   ibuprofen 200 MG tablet Commonly known as:  ADVIL,MOTRIN   multivitamin capsule   TURMERIC PO     TAKE these medications   ACCU-CHEK AVIVA PLUS test strip Generic drug:  glucose blood USE TO MONITOR BLOOD SUGAR 3 TIMES A WEEK   albuterol 108 (90 Base) MCG/ACT inhaler Commonly known as:  PROVENTIL HFA;VENTOLIN HFA Inhale 2 puffs into the lungs every 4 (four) hours as needed. What changed:  reasons to take this   cetirizine 10 MG tablet Commonly known as:  ZYRTEC Take 10 mg by mouth daily.   losartan 100 MG tablet Commonly known as:  COZAAR  TAKE 1 TABLET BY MOUTH ONCE A DAY   methocarbamol 500 MG tablet Commonly known as:  ROBAXIN Take 1 tablet (500 mg total) by mouth every 6 (six) hours as needed for muscle spasms.   metoprolol succinate 50 MG 24 hr  tablet Commonly known as:  TOPROL-XL TAKE 1 TABLET BY MOUTH ONCE A DAY WITH OR IMMEDIATELY FOLLOWING A MEAL   oxyCODONE 5 MG immediate release tablet Commonly known as:  Oxy IR/ROXICODONE Take 1-2 tablets (5-10 mg total) by mouth every 4 (four) hours as needed for moderate pain or severe pain.   pravastatin 20 MG tablet Commonly known as:  PRAVACHOL TAKE 1 TABLET BY MOUTH ONCE A DAY   ranitidine 150 MG capsule Commonly known as:  ZANTAC Take 150 mg by mouth daily.   rivaroxaban 10 MG Tabs tablet Commonly known as:  XARELTO Take 1 tablet (10 mg total) by mouth daily with breakfast. Take Xarelto for two and a half more weeks, then discontinue Xarelto. Once the patient has completed the Xarelto, they may resume the 81 mg Aspirin.   tetrahydrozoline-zinc 0.05-0.25 % ophthalmic solution Commonly known as:  VISINE-AC Place 2 drops into both eyes 3 (three) times daily as needed (dryness).   traMADol 50 MG tablet Commonly known as:  ULTRAM Take 1-2 tablets (50-100 mg total) by mouth every 6 (six) hours as needed (mild pain). What changed:  how much to take  when to take this  reasons to take this      Follow-up Damon .   Specialty:  Home Health Services Why:  HHPT has been ordered by your MD and will be provided by [facility]. A representative will be in touch with you within 24 hours of your discharge to arrange your initial visit.  Contact information: [address] [place] [address] [phone]        Gearlean Alf, MD. Schedule an appointment as soon as possible for a visit on 04/19/2016.   Specialty:  Orthopedic Surgery Why:  Call office at [phone] to setup appointment with Dr. Wynelle Link on Tuesday 04/19/2016. Contact information: [address] [place] [address] [phone]           Signed: Arlee Muslim, PA-C Orthopaedic Surgery 04/08/2016, 8:06 AM

## 2016-05-16 ENCOUNTER — Encounter: Payer: Self-pay | Admitting: Family Medicine

## 2016-05-16 LAB — HM DIABETES EYE EXAM

## 2016-05-23 ENCOUNTER — Other Ambulatory Visit: Payer: Self-pay | Admitting: Family Medicine

## 2016-05-23 DIAGNOSIS — Z1231 Encounter for screening mammogram for malignant neoplasm of breast: Secondary | ICD-10-CM

## 2016-06-03 ENCOUNTER — Encounter: Payer: Self-pay | Admitting: Family Medicine

## 2016-06-03 ENCOUNTER — Ambulatory Visit (INDEPENDENT_AMBULATORY_CARE_PROVIDER_SITE_OTHER): Payer: Medicare Other | Admitting: Family Medicine

## 2016-06-03 VITALS — BP 130/72 | HR 68 | Temp 98.0°F | Resp 12 | Ht 61.0 in | Wt 155.0 lb

## 2016-06-03 DIAGNOSIS — R590 Localized enlarged lymph nodes: Secondary | ICD-10-CM | POA: Diagnosis not present

## 2016-06-03 DIAGNOSIS — I10 Essential (primary) hypertension: Secondary | ICD-10-CM

## 2016-06-03 DIAGNOSIS — R0789 Other chest pain: Secondary | ICD-10-CM | POA: Diagnosis not present

## 2016-06-03 DIAGNOSIS — Z13820 Encounter for screening for osteoporosis: Secondary | ICD-10-CM | POA: Diagnosis not present

## 2016-06-03 DIAGNOSIS — E7800 Pure hypercholesterolemia, unspecified: Secondary | ICD-10-CM | POA: Diagnosis not present

## 2016-06-03 DIAGNOSIS — R7303 Prediabetes: Secondary | ICD-10-CM

## 2016-06-03 DIAGNOSIS — Z0000 Encounter for general adult medical examination without abnormal findings: Secondary | ICD-10-CM | POA: Diagnosis not present

## 2016-06-03 DIAGNOSIS — Z23 Encounter for immunization: Secondary | ICD-10-CM

## 2016-06-03 LAB — COMPREHENSIVE METABOLIC PANEL
ALBUMIN: 4.3 g/dL (ref 3.6–5.1)
ALK PHOS: 64 U/L (ref 33–130)
ALT: 15 U/L (ref 6–29)
AST: 22 U/L (ref 10–35)
BILIRUBIN TOTAL: 0.8 mg/dL (ref 0.2–1.2)
BUN: 13 mg/dL (ref 7–25)
CALCIUM: 9.7 mg/dL (ref 8.6–10.4)
CO2: 27 mmol/L (ref 20–31)
Chloride: 101 mmol/L (ref 98–110)
Creat: 0.78 mg/dL (ref 0.50–0.99)
Glucose, Bld: 109 mg/dL — ABNORMAL HIGH (ref 70–99)
Potassium: 4.6 mmol/L (ref 3.5–5.3)
Sodium: 137 mmol/L (ref 135–146)
Total Protein: 7.1 g/dL (ref 6.1–8.1)

## 2016-06-03 LAB — CBC WITH DIFFERENTIAL/PLATELET
Basophils Absolute: 0 cells/uL (ref 0–200)
Basophils Relative: 0 %
EOS ABS: 225 {cells}/uL (ref 15–500)
Eosinophils Relative: 3 %
HEMATOCRIT: 40.8 % (ref 35.0–45.0)
HEMOGLOBIN: 13.2 g/dL (ref 12.0–15.0)
LYMPHS ABS: 1125 {cells}/uL (ref 850–3900)
Lymphocytes Relative: 15 %
MCH: 28.4 pg (ref 27.0–33.0)
MCHC: 32.4 g/dL (ref 32.0–36.0)
MCV: 87.9 fL (ref 80.0–100.0)
MONO ABS: 900 {cells}/uL (ref 200–950)
MPV: 10 fL (ref 7.5–12.5)
Monocytes Relative: 12 %
NEUTROS PCT: 70 %
Neutro Abs: 5250 cells/uL (ref 1500–7800)
Platelets: 263 10*3/uL (ref 140–400)
RBC: 4.64 MIL/uL (ref 3.80–5.10)
RDW: 13.8 % (ref 11.0–15.0)
WBC: 7.5 10*3/uL (ref 3.8–10.8)

## 2016-06-03 LAB — LIPID PANEL
Cholesterol: 125 mg/dL (ref <200)
HDL: 33 mg/dL — AB (ref >50)
LDL Cholesterol: 66 mg/dL (ref <100)
TRIGLYCERIDES: 128 mg/dL (ref <150)
Total CHOL/HDL Ratio: 3.8 Ratio (ref <5.0)
VLDL: 26 mg/dL (ref <30)

## 2016-06-03 LAB — TSH: TSH: 2.43 mIU/L

## 2016-06-03 MED ORDER — PRAVASTATIN SODIUM 20 MG PO TABS: 20.0000 mg | ORAL_TABLET | Freq: Every day | ORAL | 2 refills | Status: DC

## 2016-06-03 MED ORDER — METOPROLOL SUCCINATE ER 50 MG PO TB24: ORAL_TABLET | ORAL | 2 refills | Status: DC

## 2016-06-03 MED ORDER — CETIRIZINE HCL 10 MG PO TABS: 10.0000 mg | ORAL_TABLET | Freq: Every day | ORAL | 2 refills | Status: DC

## 2016-06-03 MED ORDER — LOSARTAN POTASSIUM 100 MG PO TABS: 100.0000 mg | ORAL_TABLET | Freq: Every day | ORAL | 2 refills | Status: DC

## 2016-06-03 MED ORDER — DEXLANSOPRAZOLE 60 MG PO CPDR: 60.0000 mg | DELAYED_RELEASE_CAPSULE | Freq: Every day | ORAL | 0 refills | Status: DC

## 2016-06-03 MED ORDER — ALBUTEROL SULFATE HFA 108 (90 BASE) MCG/ACT IN AERS: 2.0000 | INHALATION_SPRAY | RESPIRATORY_TRACT | 3 refills | Status: DC | PRN

## 2016-06-03 MED ORDER — TRAMADOL HCL 50 MG PO TABS: 50.0000 mg | ORAL_TABLET | Freq: Four times a day (QID) | ORAL | 1 refills | Status: DC | PRN

## 2016-06-03 NOTE — Progress Notes (Signed)
Subjective:   Patient presents for Medicare Annual/Subsequent preventive examination.   Pt here for annual wellness exam. Medications reviewed  Recent left knee replacement by Dr. Ricki Rodriguez in Oct    Chest pain- intermittant episodes of chest painOccurs mostly in the epigastric region will sometimes come up into her chest. She has history of GERD has been on Zantac for a few years. She states the feeling is similar to when she had her gallbladder and she had her gallbladder removed but still had intermittent episodes. She does state when she belches or passes gas the pain goes away and is very short-lived. The other caveat however is that when she exerts herself randomly she will get chest discomfort is mostly in the center for chest non radiating, no SOB/DIaphoresis associated and not the epigastric region. She does admit to taking NSAIDs because of her knee and occasionally for her varicose veins.   HTN- taking BP meds as prescribed , losrtan and toprol  Borderline DM- last A1C was 6% 2 months ago ,some  intential weight loss over past few months, weight down 20lbs since July , She states that she's been trying to watch things but then noted after her knee surgery her appetite was decreased because of the pain medications that she was taking she felt sick on the stomach therefore was not eating as much. Her appetite now is back to baseline.   Hyperlipidemia- on pravastatin as prescribed     Review Past Medical/Family/Social: Per EMR    Risk Factors  Current exercise habits: walks/home PT exercises from knee  Dietary issues discussed: Yes  Cardiac risk factors: Obesity (BMI >= 30 kg/m2). HTN, Hyperlipidemia, borderline DM   Depression Screen  (Note: if answer to either of the following is "Yes", a more complete depression screening is indicated)  Over the past two weeks, have you felt down, depressed or hopeless? No Over the past two weeks, have you felt little interest or pleasure in doing  things? No Have you lost interest or pleasure in daily life? No Do you often feel hopeless? No Do you cry easily over simple problems? No   Activities of Daily Living  In your present state of health, do you have any difficulty performing the following activities?:  Driving? No  Managing money? No  Feeding yourself? No  Getting from bed to chair? No  Climbing a flight of stairs? No  Preparing food and eating?: No  Bathing or showering? No  Getting dressed: No  Getting to the toilet? No  Using the toilet:No  Moving around from place to place: No  In the past year have you fallen or had a near fall?:No  Are you sexually active? No  Do you have more than one partner? No   Hearing Difficulties: No  Do you often ask people to speak up or repeat themselves? No  Do you experience ringing or noises in your ears? No Do you have difficulty understanding soft or whispered voices? No  Do you feel that you have a problem with memory? No Do you often misplace items? somestimes  Do you feel safe at home? Yes  Cognitive Testing  Alert? Yes Normal Appearance?Yes  Oriented to person? Yes Place? Yes  Time? Yes  Recall of three objects? Yes  Can perform simple calculations? Yes  Displays appropriate judgment?Yes  Can read the correct time from a watch face?Yes   List the Names of Other Physician/Practitioners you currently use: Oncology, Orthopedics     Screening Tests /  Date Colonoscopy   UTD                  Zostavax - UTD  Mammogram UTD  Influenza Vaccine UTD Tetanus/tdap UTD  Pneumonia- Had Pneumonia vaccine 23 at age 52,due for repeat   ROS: GEN- denies fatigue, fever, +weight loss,weakness, recent illness HEENT- denies eye drainage, change in vision, nasal discharge, CVS- denies chest pain, palpitations RESP- denies SOB, cough, wheeze ABD- denies N/V, change in stools, abd pain GU- denies dysuria, hematuria, dribbling, incontinence MSK- + joint pain, muscle aches,  injury Neuro- denies headache, dizziness, syncope, seizure activity  Physiacl  GEN- NAD, alert and oriented x3 HEENT- PERRL, EOMI, non injected sclera, pink conjunctiva, MMM, oropharynx clear Neck- Supple, no thryomegaly, left supraclavicular node palpated (appears a little larger in setting of weight loss) CVS- RRR, no murmur RESP-CTAB ABD-NABS,soft,NT,ND  EXT-  varicose veins bilat, mild pedal edema LLE, swelling left knee , incision well healed  Pulses- Radial, DP- 2+  EKG- Poor r wave progression   Assessment:    Annual wellness medicare exam   Plan:    During the course of the visit the patient was educated and counseled about appropriate screening and preventive services including:  Screening mammography - Due in Jan  Bone Density- Due   Screen NEG  for depression.   Blood pressure is controlled we'll check her fasting labs today. I am concerned about the amount of weight loss in the short period of time as well as this intermittent chest discomfort. While about 90% of this sounds like reflux symptoms I'm concerned about the exertional component with the chest pain. It is a little difficult to tease out if these are even connected her weight loss and her discomfort pain. She does have underlying lymphadenopathy which is being monitored by oncology. The node seems more prominent but she's also had the weight loss she feels like it is no different than usual.  Other first proceed with getting labs including a CBC she did have elevated white blood count account during her surgery but this may been more stress reaction she did not have any significant infection with surgery. I will also check a TSH metabolic panel. Her borderline diabetes is actually improved her A1c was 6% 8 weeks ago.  With regards to the epigastric/chest pain ominous start her on Dexilant 60 mg she will take once a day and hold off on the Zantac. She does have significant risk factors for coronary artery disease  therefore medicine her to cardiology her EKG does show some poor R-wave progression which is a little nonspecific but could indicate some underlying blockage.  If her white blood cells is still elevated we'll proceed with scanning of her neck as well as chest and abdomen.    Diet review for nutrition referral? Yes ____ Not Indicated __x__  Patient Instructions (the written plan) was given to the patient.  Medicare Attestation  I have personally reviewed:  The patient's medical and social history  Their use of alcohol, tobacco or illicit drugs  Their current medications and supplements  The patient's functional ability including ADLs,fall risks, home safety risks, cognitive, and hearing and visual impairment  Diet and physical activities  Evidence for depression or mood disorders  The patient's weight, height, BMI, and visual acuity have been recorded in the chart. I have made referrals, counseling, and provided education to the patient based on review of the above and I have provided the patient with a written personalized care  plan for preventive services.

## 2016-06-03 NOTE — Patient Instructions (Addendum)
Referral to cardiology  Continue current medications Pneumonia vaccine given  Try the dexilant once a day  We will call with lab results F/U 6 months

## 2016-06-17 ENCOUNTER — Other Ambulatory Visit: Payer: Self-pay | Admitting: Family Medicine

## 2016-06-17 DIAGNOSIS — M8580 Other specified disorders of bone density and structure, unspecified site: Secondary | ICD-10-CM

## 2016-07-04 ENCOUNTER — Ambulatory Visit: Payer: Medicare Other

## 2016-07-12 ENCOUNTER — Ambulatory Visit
Admission: RE | Admit: 2016-07-12 | Discharge: 2016-07-12 | Disposition: A | Discharge status: Home / Self Care | Payer: Medicare Other | Source: Ambulatory Visit | Attending: Family Medicine | Admitting: Family Medicine

## 2016-07-12 DIAGNOSIS — Z1231 Encounter for screening mammogram for malignant neoplasm of breast: Secondary | ICD-10-CM

## 2016-07-12 DIAGNOSIS — M8580 Other specified disorders of bone density and structure, unspecified site: Secondary | ICD-10-CM

## 2016-07-16 ENCOUNTER — Other Ambulatory Visit: Payer: Self-pay | Admitting: Family Medicine

## 2016-08-08 NOTE — Progress Notes (Signed)
Cardiology Office Note   Date:  08/09/2016   ID:  [name], [name] 09-11-1945, MRN [id number]  PCP:  Vic Blackbird, MD  Cardiologist:   Jenkins Rouge, MD   Chief Complaint  Patient presents with  . Chest Pain  . Establish Care      History of Present Illness: Cathy [name] is a 71 y.o. female who presents for evaluation of chest pain. Seen by primary 06/03/16 Pain mostly in epigastric area. Radiate to chest. History of GERD on Zantac. Pain similar to GB pain Before removal. Improved with belching and passing gas. Does get some central chest pressure with exertion at times.  CRF's HTN compliant with meds. Pre DM A1c 6 Elevated lipids on statin  Started on dexilant and referred To cardiology.   Echo 11/03/14 EF 55-60% mild LVH MIld LAE trivial regurgitation   Has had adenopathy in neck chest and upper abdomen with biopsy showing atypical lymphoid hyperplasia.    Past Medical History:  Diagnosis Date  . Abnormal glucose   . Allergy   . Asthma    Allergy induced  . Benign breast cyst in female   . GERD (gastroesophageal reflux disease)   . Hilar lymphadenopathy 08/01/2011  . History of nuclear stress test 2009   Treadmill and Stress Myoview- no CAD  . Hyperlipidemia   . Hypertension   . Lymphadenopathy of left cervical region 08/01/2011  . Nephrolithiasis 1984  . Osteopenia   . PONV (postoperative nausea and vomiting)   . Post-menopause   . Pre-diabetes    borderline  . 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 Vision changes     Past Surgical History:  Procedure Laterality Date  . CHOLECYSTECTOMY    . LITHOTRIPSY  1984  . LYMPH NODE BIOPSY    . 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 ACCU-CHEK AVIVA PLUS test strip USE TO MONITOR BLOOD SUGAR 3  TIMES A WEEK 50 each 11  . albuterol (PROVENTIL HFA;VENTOLIN HFA) 108 (90 Base) MCG/ACT inhaler Inhale 2 puffs into the lungs every 4 (four) hours as needed. 1 Inhaler 3  . aspirin EC 81 MG tablet Take 81 mg by mouth daily.    . calcium carbonate (OSCAL) 1500 (600 Ca) MG TABS tablet Take 600 mg of elemental calcium by mouth daily with breakfast.    . cetirizine (ZYRTEC) 10 MG tablet Take 1 tablet (10 mg total) by mouth daily. 90 tablet 2  . CINNAMON PO Take 500 mg by mouth daily.    . Glucosamine-Chondroitin (GLUCOSAMINE CHONDR COMPLEX PO) Take 400 mg by mouth daily.    Marland Kitchen losartan (COZAAR) 100 MG tablet Take 1 tablet (100 mg total) by mouth daily. 90 tablet 2  . metoprolol succinate (TOPROL-XL) 50 MG 24 hr tablet TAKE 1 TABLET BY MOUTH ONCE A DAY WITH OR IMMEDIATELY FOLLOWING A MEAL 90 tablet 2  . Multiple Vitamin (MULTIVITAMIN) capsule Take 1 capsule by mouth daily.    . Omega-3 Fatty Acids (FISH OIL PO) Take 1,000 mg by mouth daily.    . pravastatin (PRAVACHOL) 20 MG tablet Take 1 tablet (20 mg total) by mouth daily. 90 tablet 2  . ranitidine (ZANTAC) 150 MG tablet TAKE 1 TABLET BY MOUTH ONCE A DAY 90 tablet 3  .  traMADol (ULTRAM) 50 MG tablet Take 1-2 tablets (50-100 mg total) by mouth every 6 (six) hours as needed (mild pain). 80 tablet 1  . TURMERIC PO Take 1 tablet by mouth daily.     No current facility-administered medications for this visit.     Allergies:   Codeine    Social History:  The patient  reports that she has never smoked. She has never used smokeless tobacco. She reports that she does not drink alcohol or use drugs.   Family History:  The patient's family history includes Alcohol abuse in her brother; Asthma in her sister; Cancer in her father, maternal aunt, and maternal aunt; Dementia in her mother; Diabetes in her brother; Heart attack in her mother; Hypertension in her mother; Stroke in her mother.    ROS:  Please see the history of present illness.   Otherwise,  review of systems are positive for none.   All other systems are reviewed and negative.    PHYSICAL EXAM: VS:  There were no vitals taken for this visit. , BMI There is no height or weight on file to calculate BMI. Affect appropriate Healthy:  appears stated age 62: normal Neck supple with no adenopathy JVP normal no bruits no thyromegaly Lungs clear with no wheezing and good diaphragmatic motion Heart:  S1/S2 no murmur, no rub, gallop or click PMI normal Abdomen: benighn, BS positve, no tenderness, no AAA no bruit.  No HSM or HJR Distal pulses intact with no bruits No edema Neuro non-focal Skin warm and dry No muscular weakness    EKG:  06/03/16 SR rate 64 LVH poor R wave progression   Recent Labs: 06/03/2016: ALT 15; BUN 13; Creat 0.78; Hemoglobin 13.2; Platelets 263; Potassium 4.6; Sodium 137; TSH 2.43    Lipid Panel    Component Value Date/Time   CHOL 125 06/03/2016 1201   TRIG 128 06/03/2016 1201   HDL 33 (L) 06/03/2016 1201   CHOLHDL 3.8 06/03/2016 1201   VLDL 26 06/03/2016 1201   LDLCALC 66 06/03/2016 1201      Wt Readings from Last 3 Encounters:  06/03/16 155 lb (70.3 kg)  04/06/16 166 lb (75.3 kg)  03/29/16 166 lb 6.4 oz (75.5 kg)      Other studies Reviewed: Additional studies/ records that were reviewed today include: notes Dr Buelah Manis labs and ECG Echo 2016.    ASSESSMENT AND PLAN:  1.  Chest Pain atypical but multiple risk factors and abnormal ECG f/u lexiscan myovue  2. GERD: continue H2 blocker low carb diet avoid excess NSAI's 3. DM  Discussed low carb diet.  Target hemoglobin A1c is 6.5 or less.  Continue current medications. 4. Lymphadenopathy needs f/u CT scan Dr Buelah Manis to arrange follow CBC 5. HTN:  Well controlled.  Continue current medications and low sodium Dash type diet.   6. Lipids:  Labs with primary continue statin      Current medicines are reviewed at length with the patient today.  The patient does not have concerns  regarding medicines.  The following changes have been made:  no change  Labs/ tests ordered today include: Myvue  No orders of the defined types were placed in this encounter.    Disposition:   FU with me in a year      Signed, Jenkins Rouge, MD  08/09/2016 10:00 AM    [address], [place], [place]  [address] Phone: [phone]; Fax: [phone]

## 2016-08-09 ENCOUNTER — Encounter (INDEPENDENT_AMBULATORY_CARE_PROVIDER_SITE_OTHER): Payer: Self-pay

## 2016-08-09 ENCOUNTER — Ambulatory Visit (INDEPENDENT_AMBULATORY_CARE_PROVIDER_SITE_OTHER): Payer: Medicare Other | Admitting: Cardiovascular Disease

## 2016-08-09 ENCOUNTER — Encounter: Payer: Self-pay | Admitting: Cardiovascular Disease

## 2016-08-09 VITALS — BP 144/78 | HR 79 | Ht 62.0 in | Wt 162.0 lb

## 2016-08-09 DIAGNOSIS — R079 Chest pain, unspecified: Secondary | ICD-10-CM | POA: Diagnosis not present

## 2016-08-09 DIAGNOSIS — Z7689 Persons encountering health services in other specified circumstances: Secondary | ICD-10-CM | POA: Diagnosis not present

## 2016-08-09 NOTE — Patient Instructions (Addendum)
Medication Instructions:  Your physician recommends that you continue on your current medications as directed. Please refer to the Current Medication list given to you today.  Labwork: NONE  Testing/Procedures: Your physician has requested that you have a lexiscan myoview. For further information please visit [email or web address]. Please follow instruction sheet, as given.  Follow-Up: Your physician wants you to follow-up in: 12 months with Dr. Nishan. You will receive a reminder letter in the mail two months in advance. If you don't receive a letter, please call our office to schedule the follow-up appointment.   If you need a refill on your cardiac medications before your next appointment, please call your pharmacy.    

## 2016-08-11 ENCOUNTER — Telehealth (HOSPITAL_COMMUNITY): Payer: Self-pay | Admitting: *Deleted

## 2016-08-11 NOTE — Telephone Encounter (Signed)
Patient given detailed instructions per Myocardial Perfusion Study Information Sheet for the test on 08/16/16  Patient notified to arrive 15 minutes early and that it is imperative to arrive on time for appointment to keep from having the test rescheduled.  If you need to cancel or reschedule your appointment, please call the office within 24 hours of your appointment. Failure to do so may result in a cancellation of your appointment, and a $50 no show fee. Patient verbalized understanding. Kirstie Peri

## 2016-08-15 ENCOUNTER — Other Ambulatory Visit (HOSPITAL_BASED_OUTPATIENT_CLINIC_OR_DEPARTMENT_OTHER): Payer: Medicare Other

## 2016-08-15 ENCOUNTER — Ambulatory Visit (HOSPITAL_BASED_OUTPATIENT_CLINIC_OR_DEPARTMENT_OTHER): Payer: Medicare Other | Admitting: Hematology and Oncology

## 2016-08-15 ENCOUNTER — Telehealth: Payer: Self-pay | Admitting: Hematology and Oncology

## 2016-08-15 ENCOUNTER — Encounter: Payer: Self-pay | Admitting: Hematology and Oncology

## 2016-08-15 VITALS — BP 159/70 | HR 80 | Temp 98.1°F | Resp 18 | Wt 163.2 lb

## 2016-08-15 DIAGNOSIS — R590 Localized enlarged lymph nodes: Secondary | ICD-10-CM

## 2016-08-15 DIAGNOSIS — R599 Enlarged lymph nodes, unspecified: Secondary | ICD-10-CM | POA: Diagnosis not present

## 2016-08-15 LAB — CBC WITH DIFFERENTIAL/PLATELET
BASO%: 0.5 % (ref 0.0–2.0)
Basophils Absolute: 0 10*3/uL (ref 0.0–0.1)
EOS ABS: 0.3 10*3/uL (ref 0.0–0.5)
EOS%: 3.4 % (ref 0.0–7.0)
HEMATOCRIT: 44.4 % (ref 34.8–46.6)
HEMOGLOBIN: 14.6 g/dL (ref 11.6–15.9)
LYMPH#: 1.8 10*3/uL (ref 0.9–3.3)
LYMPH%: 21.3 % (ref 14.0–49.7)
MCH: 28.4 pg (ref 25.1–34.0)
MCHC: 32.8 g/dL (ref 31.5–36.0)
MCV: 86.5 fL (ref 79.5–101.0)
MONO#: 0.9 10*3/uL (ref 0.1–0.9)
MONO%: 10.1 % (ref 0.0–14.0)
NEUT%: 64.7 % (ref 38.4–76.8)
NEUTROS ABS: 5.5 10*3/uL (ref 1.5–6.5)
PLATELETS: 240 10*3/uL (ref 145–400)
RBC: 5.12 10*6/uL (ref 3.70–5.45)
RDW: 14.9 % — ABNORMAL HIGH (ref 11.2–14.5)
WBC: 8.6 10*3/uL (ref 3.9–10.3)

## 2016-08-15 LAB — COMPREHENSIVE METABOLIC PANEL
ALK PHOS: 67 U/L (ref 40–150)
ALT: 29 U/L (ref 0–55)
AST: 30 U/L (ref 5–34)
Albumin: 4.5 g/dL (ref 3.5–5.0)
Anion Gap: 10 mEq/L (ref 3–11)
BUN: 12.7 mg/dL (ref 7.0–26.0)
CHLORIDE: 106 meq/L (ref 98–109)
CO2: 27 meq/L (ref 22–29)
Calcium: 10.5 mg/dL — ABNORMAL HIGH (ref 8.4–10.4)
Creatinine: 0.9 mg/dL (ref 0.6–1.1)
EGFR: 69 mL/min/{1.73_m2} — AB (ref >90)
GLUCOSE: 111 mg/dL (ref 70–140)
POTASSIUM: 4.8 meq/L (ref 3.5–5.1)
SODIUM: 143 meq/L (ref 136–145)
Total Bilirubin: 0.8 mg/dL (ref 0.20–1.20)
Total Protein: 7.9 g/dL (ref 6.4–8.3)

## 2016-08-15 NOTE — Progress Notes (Signed)
[facility] OFFICE PROGRESS NOTE  Patient Care Team: Alycia Rossetti, MD as PCP - General (Family Medicine) Heath Lark, MD as Consulting Physician (Hematology and Oncology)  SUMMARY OF ONCOLOGIC HISTORY:  I reviewed the patient's records extensive and collaborated the history with the patient. 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 She had an incisional biopsy on October 25 which showed atypical lymphoid hyperplasia. Biopsies were sent for second opinion. This appeared to be a T-cell process with increased CD4 positive T lymphocytes but a firm diagnosis of lymphoma could not be established. CT scans of the neck chest abdomen and pelvis showed adenopathy in the left supraclavicular and left axillary regions and a single large right paratracheal lymph node 2 cm in diameter. No abdominal or pelvic adenopathy and no splenomegaly.  She had no constitutional symptoms. Lab testing showed previous exposure to EBV and CMV viruses with elevated IgG but not IgM titers. There was also a significant elevation of IgG against toxoplasmosis. She had no history of exposure to cats or cat litter. She was not anemic. She had a normal white blood count and differential. Serum LDH normal. ESR 4 mm.  I elected to follow her with close observation alone and serial CT scans. She has remained asymptomatic.   INTERVAL HISTORY: Please see below for problem oriented charting. She feels well. She had recent knee surgery. She denies pain. No progression of lymphadenopathy.  Denies recent abnormal fevers, night sweats or weight loss  REVIEW OF SYSTEMS:   Constitutional: Denies fevers, chills or abnormal weight loss Eyes: Denies blurriness of vision Ears, nose, mouth, throat, and face: Denies mucositis or sore  throat Respiratory: Denies cough, dyspnea or wheezes Cardiovascular: Denies palpitation, chest discomfort or lower extremity swelling Gastrointestinal:  Denies nausea, heartburn or change in bowel habits Skin: Denies abnormal skin rashes Lymphatics: Denies new lymphadenopathy or easy bruising Neurological:Denies numbness, tingling or new weaknesses Behavioral/Psych: Mood is stable, no new changes  All other systems were reviewed with the patient and are negative.  I have reviewed the past medical history, past surgical history, social history and family history with the patient and they are unchanged from previous note.  ALLERGIES:  is allergic to codeine.  MEDICATIONS:  Current Outpatient Prescriptions  Medication Sig Dispense Refill  . ACCU-CHEK AVIVA PLUS test strip USE TO MONITOR BLOOD SUGAR 3 TIMES A WEEK 50 each 11  . albuterol (PROVENTIL HFA;VENTOLIN HFA) 108 (90 Base) MCG/ACT inhaler Inhale 2 puffs into the lungs every 4 (four) hours as needed. 1 Inhaler 3  . aspirin EC 81 MG tablet Take 81 mg by mouth daily.    . calcium carbonate (OSCAL) 1500 (600 Ca) MG TABS tablet Take 600 mg of elemental calcium by mouth daily with breakfast.    . cetirizine (ZYRTEC) 10 MG tablet Take 1 tablet (10 mg total) by mouth daily. 90 tablet 2  . CINNAMON PO Take 500 mg by mouth daily.    . Glucosamine-Chondroitin (GLUCOSAMINE CHONDR COMPLEX PO) Take 400 mg by mouth daily.    Marland Kitchen losartan (COZAAR) 100 MG tablet Take 1 tablet (100 mg total) by mouth daily. 90 tablet 2  . metoprolol succinate (TOPROL-XL) 50 MG 24 hr tablet TAKE 1 TABLET BY MOUTH ONCE A DAY WITH OR IMMEDIATELY FOLLOWING A MEAL 90 tablet 2  . Multiple Vitamin (MULTIVITAMIN) capsule Take 1 capsule  by mouth daily.    . Omega-3 Fatty Acids (FISH OIL PO) Take 1,000 mg by mouth daily.    . pravastatin (PRAVACHOL) 20 MG tablet Take 1 tablet (20 mg total) by mouth daily. 90 tablet 2  . ranitidine (ZANTAC) 150 MG tablet TAKE 1 TABLET BY MOUTH ONCE  A DAY 90 tablet 3  . TURMERIC PO Take 1 tablet by mouth daily.    . traMADol (ULTRAM) 50 MG tablet Take 1-2 tablets (50-100 mg total) by mouth every 6 (six) hours as needed (mild pain). (Patient not taking: Reported on 08/15/2016) 80 tablet 1   No current facility-administered medications for this visit.     PHYSICAL EXAMINATION: ECOG PERFORMANCE STATUS: 0 - Asymptomatic  Vitals:   08/15/16 1052  BP: (!) 159/70  Pulse: 80  Resp: 18  Temp: 98.1 F (36.7 C)   Filed Weights   08/15/16 1052  Weight: 163 lb 3.2 oz (74 kg)    GENERAL:alert, no distress and comfortable SKIN: skin color, texture, turgor are normal, no rashes or significant lesions EYES: normal, Conjunctiva are pink and non-injected, sclera clear OROPHARYNX:no exudate, no erythema and lips, buccal mucosa, and tongue normal  NECK: supple, thyroid normal size, non-tender, without nodularity LYMPH: She has persistent palpable lymphadenopathy in the supraclavicular and axillary region  LUNGS: clear to auscultation and percussion with normal breathing effort HEART: regular rate & rhythm and no murmurs and no lower extremity edema ABDOMEN:abdomen soft, non-tender and normal bowel sounds Musculoskeletal:no cyanosis of digits and no clubbing  NEURO: alert & oriented x 3 with fluent speech, no focal motor/sensory deficits  LABORATORY DATA:  I have reviewed the data as listed    Component Value Date/Time   NA 143 08/15/2016 1037   K 4.8 08/15/2016 1037   CL 101 06/03/2016 1201   CL 103 07/16/2012 1304   CO2 27 08/15/2016 1037   GLUCOSE 111 08/15/2016 1037   GLUCOSE 112 (H) 07/16/2012 1304   BUN 12.7 08/15/2016 1037   CREATININE 0.9 08/15/2016 1037   CALCIUM 10.5 (H) 08/15/2016 1037   PROT 7.9 08/15/2016 1037   ALBUMIN 4.5 08/15/2016 1037   AST 30 08/15/2016 1037   ALT 29 08/15/2016 1037   ALKPHOS 67 08/15/2016 1037   BILITOT 0.80 08/15/2016 1037   GFRNONAA >60 04/08/2016 0449   GFRNONAA 85 03/05/2015 1601   GFRAA  >60 04/08/2016 0449   GFRAA >89 03/05/2015 1601    No results found for: SPEP, UPEP  Lab Results  Component Value Date   WBC 8.6 08/15/2016   NEUTROABS 5.5 08/15/2016   HGB 14.6 08/15/2016   HCT 44.4 08/15/2016   MCV 86.5 08/15/2016   PLT 240 08/15/2016      Chemistry      Component Value Date/Time   NA 143 08/15/2016 1037   K 4.8 08/15/2016 1037   CL 101 06/03/2016 1201   CL 103 07/16/2012 1304   CO2 27 08/15/2016 1037   BUN 12.7 08/15/2016 1037   CREATININE 0.9 08/15/2016 1037      Component Value Date/Time   CALCIUM 10.5 (H) 08/15/2016 1037   ALKPHOS 67 08/15/2016 1037   AST 30 08/15/2016 1037   ALT 29 08/15/2016 1037   BILITOT 0.80 08/15/2016 1037       ASSESSMENT & PLAN:  Lymphadenopathy of left cervical region 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 She is not symptomatic. Her last imaging study has been over a year. With new work, easy to take  treatment, I feel it is prudent for [place] to reevaluate now with repeat imaging study. I plan to order a CT scan of the neck and chest for evaluation. I recommend she holds losartan treatment the day before, the day of and the day after CT scan to reduce risks of contrast nephropathy.    Orders Placed This Encounter  Procedures  . CT CHEST W CONTRAST    Standing Status:   Future    Standing Expiration Date:   11/15/2017    Order Specific Question:   Reason for exam:    Answer:   diffuse lymphadenopathy, assess for progression    Order Specific Question:   Preferred imaging location?    Answer:   [facility]  . CT SOFT TISSUE NECK W CONTRAST    Standing Status:   Future    Standing Expiration Date:   11/15/2017    Order Specific Question:   Reason for Exam (SYMPTOM  OR DIAGNOSIS REQUIRED)    Answer:   diffuse lymphadenopathy, assess for progression    Order Specific Question:   Preferred imaging location?    Answer:   [facility]   All questions were answered. The  patient knows to call the clinic with any problems, questions or concerns. No barriers to learning was detected. I spent 15 minutes counseling the patient face to face. The total time spent in the appointment was 20 minutes and more than 50% was on counseling and review of test results     Heath Lark, MD 08/15/2016 12:28 PM

## 2016-08-15 NOTE — Assessment & Plan Note (Signed)
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 She is not symptomatic. Her last imaging study has been over a year. With new work, easy to take treatment, I feel it is prudent for [place] to reevaluate now with repeat imaging study. I plan to order a CT scan of the neck and chest for evaluation. I recommend she holds losartan treatment the day before, the day of and the day after CT scan to reduce risks of contrast nephropathy.

## 2016-08-15 NOTE — Telephone Encounter (Signed)
Appointments scheduled per 3/5 LOS. Patient given AVS report and calendars with future scheduled appointments. °

## 2016-08-16 ENCOUNTER — Ambulatory Visit (HOSPITAL_COMMUNITY): Payer: Medicare Other | Attending: Cardiology

## 2016-08-16 DIAGNOSIS — Z8249 Family history of ischemic heart disease and other diseases of the circulatory system: Secondary | ICD-10-CM | POA: Insufficient documentation

## 2016-08-16 DIAGNOSIS — R0609 Other forms of dyspnea: Secondary | ICD-10-CM | POA: Diagnosis not present

## 2016-08-16 DIAGNOSIS — R079 Chest pain, unspecified: Secondary | ICD-10-CM

## 2016-08-16 DIAGNOSIS — Z7689 Persons encountering health services in other specified circumstances: Secondary | ICD-10-CM | POA: Diagnosis not present

## 2016-08-16 DIAGNOSIS — R0789 Other chest pain: Secondary | ICD-10-CM | POA: Diagnosis not present

## 2016-08-16 DIAGNOSIS — I10 Essential (primary) hypertension: Secondary | ICD-10-CM | POA: Diagnosis not present

## 2016-08-16 DIAGNOSIS — R9431 Abnormal electrocardiogram [ECG] [EKG]: Secondary | ICD-10-CM | POA: Diagnosis not present

## 2016-08-16 LAB — MYOCARDIAL PERFUSION IMAGING
CHL CUP NUCLEAR SRS: 2
CHL CUP NUCLEAR SSS: 2
LV dias vol: 80 mL (ref 46–106)
LV sys vol: 32 mL
NUC STRESS TID: 1.02
Peak HR: 98 {beats}/min
RATE: 0.28
Rest HR: 68 {beats}/min
SDS: 0

## 2016-08-16 MED ORDER — TECHNETIUM TC 99M TETROFOSMIN IV KIT
32.9000 | PACK | Freq: Once | INTRAVENOUS | Status: DC | PRN
  Administered 2016-08-16: 32.9 via INTRAVENOUS
  Filled 2016-08-16: qty 33

## 2016-08-16 MED ORDER — REGADENOSON 0.4 MG/5ML IV SOLN
0.4000 mg | Freq: Once | INTRAVENOUS | Status: AC
Start: 2016-08-16 — End: 2016-08-16
  Administered 2016-08-16: 0.4 mg via INTRAVENOUS

## 2016-08-16 MED ORDER — TECHNETIUM TC 99M TETROFOSMIN IV KIT
10.1000 | PACK | Freq: Once | INTRAVENOUS | Status: AC | PRN
  Administered 2016-08-16: 10.1 via INTRAVENOUS
  Filled 2016-08-16: qty 11

## 2016-08-24 ENCOUNTER — Telehealth: Payer: Self-pay | Admitting: Hematology and Oncology

## 2016-08-24 NOTE — Telephone Encounter (Signed)
Patient called to r/s appt 4/3 to 4/9 with Dr. [name]. Patient is aware of new date and time

## 2016-09-12 ENCOUNTER — Other Ambulatory Visit: Payer: Medicare Other

## 2016-09-12 ENCOUNTER — Ambulatory Visit (HOSPITAL_COMMUNITY)
Admission: RE | Admit: 2016-09-12 | Discharge: 2016-09-12 | Disposition: A | Discharge status: Home / Self Care | Payer: Medicare Other | Source: Ambulatory Visit | Attending: Hematology and Oncology | Admitting: Hematology and Oncology

## 2016-09-12 ENCOUNTER — Encounter (HOSPITAL_COMMUNITY): Payer: Self-pay

## 2016-09-12 DIAGNOSIS — R591 Generalized enlarged lymph nodes: Secondary | ICD-10-CM | POA: Insufficient documentation

## 2016-09-12 DIAGNOSIS — R590 Localized enlarged lymph nodes: Secondary | ICD-10-CM | POA: Diagnosis present

## 2016-09-12 DIAGNOSIS — I2510 Atherosclerotic heart disease of native coronary artery without angina pectoris: Secondary | ICD-10-CM | POA: Diagnosis not present

## 2016-09-12 DIAGNOSIS — R918 Other nonspecific abnormal finding of lung field: Secondary | ICD-10-CM | POA: Insufficient documentation

## 2016-09-12 DIAGNOSIS — I700 Atherosclerosis of aorta: Secondary | ICD-10-CM | POA: Diagnosis not present

## 2016-09-12 MED ORDER — IOPAMIDOL (ISOVUE-300) INJECTION 61%
INTRAVENOUS | Status: AC
  Filled 2016-09-12: qty 75

## 2016-09-12 MED ORDER — IOPAMIDOL (ISOVUE-300) INJECTION 61%
100.0000 mL | Freq: Once | INTRAVENOUS | Status: AC | PRN
  Administered 2016-09-12: 100 mL via INTRAVENOUS

## 2016-09-13 ENCOUNTER — Ambulatory Visit: Payer: Medicare Other | Admitting: Hematology and Oncology

## 2016-09-19 ENCOUNTER — Encounter: Payer: Self-pay | Admitting: Hematology and Oncology

## 2016-09-19 ENCOUNTER — Telehealth: Payer: Self-pay | Admitting: Hematology and Oncology

## 2016-09-19 ENCOUNTER — Ambulatory Visit (HOSPITAL_BASED_OUTPATIENT_CLINIC_OR_DEPARTMENT_OTHER): Payer: Medicare Other | Admitting: Hematology and Oncology

## 2016-09-19 DIAGNOSIS — R590 Localized enlarged lymph nodes: Secondary | ICD-10-CM | POA: Diagnosis not present

## 2016-09-19 DIAGNOSIS — R918 Other nonspecific abnormal finding of lung field: Secondary | ICD-10-CM | POA: Diagnosis not present

## 2016-09-19 NOTE — Progress Notes (Signed)
[place] OFFICE PROGRESS NOTE  Patient Care Team: Alycia Rossetti, MD as PCP - General (Family Medicine) Heath Lark, MD as Consulting Physician (Hematology and Oncology)  SUMMARY OF ONCOLOGIC HISTORY:  I reviewed the patient's records extensive and collaborated the history with the patient. 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 She had an incisional biopsy on October 25 which showed atypical lymphoid hyperplasia. Biopsies were sent for second opinion. This appeared to be a T-cell process with increased CD4 positive T lymphocytes but a firm diagnosis of lymphoma could not be established. CT scans of the neck chest abdomen and pelvis showed adenopathy in the left supraclavicular and left axillary regions and a single large right paratracheal lymph node 2 cm in diameter. No abdominal or pelvic adenopathy and no splenomegaly.  She had no constitutional symptoms. Lab testing showed previous exposure to EBV and CMV viruses with elevated IgG but not IgM titers. There was also a significant elevation of IgG against toxoplasmosis. She had no history of exposure to cats or cat litter. She was not anemic. She had a normal white blood count and differential. Serum LDH normal. ESR 4 mm.  I elected to follow her with close observation alone and serial CT scans. She has remained asymptomatic.  Repeat CT scan from 09/12/16 showed minimum progression of lymphadenopathy  INTERVAL HISTORY: Please see below for problem oriented charting. She feels well.  Denies chest pain, cough or shortness of breath. No recent fever or chills  REVIEW OF SYSTEMS:   Constitutional: Denies fevers, chills or abnormal weight loss Eyes: Denies blurriness of vision Ears, nose, mouth, throat, and face: Denies mucositis or sore  throat Respiratory: Denies cough, dyspnea or wheezes Cardiovascular: Denies palpitation, chest discomfort or lower extremity swelling Gastrointestinal:  Denies nausea, heartburn or change in bowel habits Skin: Denies abnormal skin rashes Lymphatics: Denies new lymphadenopathy or easy bruising Neurological:Denies numbness, tingling or new weaknesses Behavioral/Psych: Mood is stable, no new changes  All other systems were reviewed with the patient and are negative.  I have reviewed the past medical history, past surgical history, social history and family history with the patient and they are unchanged from previous note.  ALLERGIES:  is allergic to codeine.  MEDICATIONS:  Current Outpatient Prescriptions  Medication Sig Dispense Refill  . ACCU-CHEK AVIVA PLUS test strip USE TO MONITOR BLOOD SUGAR 3 TIMES A WEEK 50 each 11  . albuterol (PROVENTIL HFA;VENTOLIN HFA) 108 (90 Base) MCG/ACT inhaler Inhale 2 puffs into the lungs every 4 (four) hours as needed. 1 Inhaler 3  . aspirin EC 81 MG tablet Take 81 mg by mouth daily.    . calcium carbonate (OSCAL) 1500 (600 Ca) MG TABS tablet Take 600 mg of elemental calcium by mouth daily with breakfast.    . cetirizine (ZYRTEC) 10 MG tablet Take 1 tablet (10 mg total) by mouth daily. 90 tablet 2  . CINNAMON PO Take 500 mg by mouth daily.    . Glucosamine-Chondroitin (GLUCOSAMINE CHONDR COMPLEX PO) Take 400 mg by mouth daily.    Marland Kitchen losartan (COZAAR) 100 MG tablet Take 1 tablet (100 mg total) by mouth daily. 90 tablet 2  . metoprolol succinate (TOPROL-XL) 50 MG 24 hr tablet TAKE 1 TABLET BY MOUTH ONCE A DAY WITH OR IMMEDIATELY FOLLOWING A MEAL 90 tablet 2  . Multiple Vitamin (MULTIVITAMIN) capsule Take  1 capsule by mouth daily.    . Omega-3 Fatty Acids (FISH OIL PO) Take 1,000 mg by mouth daily.    . pravastatin (PRAVACHOL) 20 MG tablet Take 1 tablet (20 mg total) by mouth daily. 90 tablet 2  . ranitidine (ZANTAC) 150 MG tablet TAKE 1 TABLET BY MOUTH ONCE  A DAY 90 tablet 3  . TURMERIC PO Take 1 tablet by mouth daily.    . traMADol (ULTRAM) 50 MG tablet Take 1-2 tablets (50-100 mg total) by mouth every 6 (six) hours as needed (mild pain). (Patient not taking: Reported on 08/15/2016) 80 tablet 1   No current facility-administered medications for this visit.     PHYSICAL EXAMINATION: ECOG PERFORMANCE STATUS: 0 - Asymptomatic  Vitals:   09/19/16 1421  BP: (!) 152/64  Pulse: (!) 54  Resp: 18  Temp: 98.1 F (36.7 C)   Filed Weights   09/19/16 1421  Weight: 165 lb 8 oz (75.1 kg)    GENERAL:alert, no distress and comfortable SKIN: skin color, texture, turgor are normal, no rashes or significant lesions EYES: normal, Conjunctiva are pink and non-injected, sclera clear Musculoskeletal:no cyanosis of digits and no clubbing  NEURO: alert & oriented x 3 with fluent speech, no focal motor/sensory deficits  LABORATORY DATA:  I have reviewed the data as listed    Component Value Date/Time   NA 143 08/15/2016 1037   K 4.8 08/15/2016 1037   CL 101 06/03/2016 1201   CL 103 07/16/2012 1304   CO2 27 08/15/2016 1037   GLUCOSE 111 08/15/2016 1037   GLUCOSE 112 (H) 07/16/2012 1304   BUN 12.7 08/15/2016 1037   CREATININE 0.9 08/15/2016 1037   CALCIUM 10.5 (H) 08/15/2016 1037   PROT 7.9 08/15/2016 1037   ALBUMIN 4.5 08/15/2016 1037   AST 30 08/15/2016 1037   ALT 29 08/15/2016 1037   ALKPHOS 67 08/15/2016 1037   BILITOT 0.80 08/15/2016 1037   GFRNONAA >60 04/08/2016 0449   GFRNONAA 85 03/05/2015 1601   GFRAA >60 04/08/2016 0449   GFRAA >89 03/05/2015 1601    No results found for: SPEP, UPEP  Lab Results  Component Value Date   WBC 8.6 08/15/2016   NEUTROABS 5.5 08/15/2016   HGB 14.6 08/15/2016   HCT 44.4 08/15/2016   MCV 86.5 08/15/2016   PLT 240 08/15/2016      Chemistry      Component Value Date/Time   NA 143 08/15/2016 1037   K 4.8 08/15/2016 1037   CL 101 06/03/2016 1201   CL 103 07/16/2012 1304   CO2 27 08/15/2016  1037   BUN 12.7 08/15/2016 1037   CREATININE 0.9 08/15/2016 1037      Component Value Date/Time   CALCIUM 10.5 (H) 08/15/2016 1037   ALKPHOS 67 08/15/2016 1037   AST 30 08/15/2016 1037   ALT 29 08/15/2016 1037   BILITOT 0.80 08/15/2016 1037       RADIOGRAPHIC STUDIES: I reviewed imaging study with the patient I have personally reviewed the radiological images as listed and agreed with the findings in the report. Ct Soft Tissue Neck W Contrast  Result Date: 09/12/2016 CLINICAL DATA:  Diffuse lymphadenopathy. Question of low-grade lymphoma. Assess for progression. EXAM: CT NECK WITH CONTRAST TECHNIQUE: Multidetector CT imaging of the neck was performed using the standard protocol following the bolus administration of intravenous contrast. CONTRAST:  122m ISOVUE-300 IOPAMIDOL (ISOVUE-300) INJECTION 61% COMPARISON:  03/09/2015 FINDINGS: Pharynx and larynx: No thickening of Waldeyer's ring. Tonsilliths on the left, also  seen previously and incidental. Salivary glands: Negative Thyroid: Negative Lymph nodes: Stable pattern of lymphadenopathy in the low left posterior triangle and lower cervical chain, extending to the supraclavicular fossa. Nodes have slightly decreased in size since prior. A lymph node at the junction of the supraclavicular fossa and low posterior triangle (6:52) measures 15 mm in short axis, previously 17 mm. More anteriorly on the same slice, a node measures 12 mm short axis compared to 13 mm previously. In the left posterior triangle on 6:40, there is a intervally enlarged rounded lymph node, 8 mm in diameter. Vascular: Atherosclerotic calcification. Limited intracranial: Negative Visualized orbits: Superior globe calcifications are chronic and stable. Negative for mass. Mastoids and visualized paranasal sinuses: Clear Skeleton: Degenerative changes without acute or aggressive finding. Upper chest: Reported separately IMPRESSION: 1. Chronic left neck and supraclavicular  lymphadenopathy. Single newly enlarged lymph node. Pre-existing lymphadenopathy is slightly decreased in size. 2. Chest reported separately. Electronically Signed   By: Monte Fantasia M.D.   On: 09/12/2016 09:12   Ct Chest W Contrast  Result Date: 09/12/2016 CLINICAL DATA:  Restaging lymphadenopathy. EXAM: CT CHEST WITH CONTRAST TECHNIQUE: Multidetector CT imaging of the chest was performed during intravenous contrast administration. CONTRAST:  151m ISOVUE-300 IOPAMIDOL (ISOVUE-300) INJECTION 61% COMPARISON:  Chest CT 03/09/2015 FINDINGS: Chest wall:  No breast masses are identified. Stable supraclavicular lymph nodes. 11 mm lymph node in the left supraclavicular region on image number 1 is stable. Smaller bilateral supraclavicular lymph nodes are unchanged. 14.5 mm subpectoral lymph node on image number 17 previously measured 13 mm. 19 x 15.5 mm left subpectoral lymph node on image number 23 previously measured 17.5 x 12 mm. 23 x 15.5 mm left axillary lymph node on image number 32 previously measured 23 x 15 mm. No right-sided axillary adenopathy. Cardiovascular: The heart is normal in size. No pericardial effusion. The aorta is normal in caliber. No dissection. Scattered atherosclerotic calcifications. Scattered three-vessel coronary artery calcifications. Mediastinum/Nodes: Stable mediastinal and hilar lymph nodes. 8 mm pretracheal lymph node on image 50 is stable. Right paratracheal lymph node on image number 25 measures 8.5 mm and previously measured 11 mm. 11 mm subcarinal lymph node on image 70 is stable. New line small bilateral hilar lymph nodes are stable. Lungs/Pleura: No worrisome pulmonary lesions. No pulmonary nodules. Small patchy area of tree-in-bud appearance and focal area of peribronchial thickening in the right upper lobe is likely an inflammatory process or atypical infection such as MAC. No focal infiltrate, pulmonary edema or pleural effusion. Upper Abdomen: Stable scattered upper  abdominal lymph nodes in the gastrohepatic ligament, celiac axis and periportal region. 14 mm portal lymph node on image number 139 is stable. No significant findings involving the liver or spleen. The adrenal glands and visualized pancreas are unremarkable. Status post cholecystectomy mild stable common bile duct dilatation. Musculoskeletal: No significant bony findings. Stable degenerative changes involving the spine. IMPRESSION: 1. Overall stable supraclavicular, subpectoral, axillary, mediastinal, hilar and upper abdominal lymphadenopathy. No progressive findings or new findings. 2. Patchy tree-in-bud findings in the right upper lobe likely be inflammatory or atypical infectious process such as MAC. No focal airspace consolidation or pulmonary edema. 3. Stable atherosclerotic calcifications involving the thoracic aorta and coronary arteries. Electronically Signed   By: PMarijo SanesM.D.   On: 09/12/2016 08:54    ASSESSMENT & PLAN:  Lymphadenopathy of left cervical region CT scan show mild lymphadenopathy progression but overall the patient is not symptomatic. I recommend close observation only and I plan to see  her back next year with repeat history, physical examination and blood work  Lung infiltrate on CT She is not symptomatic.  I recommend observation only  Hypercalcemia She has minimum hypercalcemia on her blood work done a month ago, likely due to her calcium and vitamin D supplement.  She is not symptomatic.  Recommend observation only   No orders of the defined types were placed in this encounter.  All questions were answered. The patient knows to call the clinic with any problems, questions or concerns. No barriers to learning was detected. I spent 15 minutes counseling the patient face to face. The total time spent in the appointment was 20 minutes and more than 50% was on counseling and review of test results     Heath Lark, MD 09/19/2016 2:43 PM

## 2016-09-19 NOTE — Telephone Encounter (Signed)
Appointments scheduled per 09/19/16 los. Patient was given a copy of the AVS report and appointment schedule, per 09/19/16 los. °

## 2016-09-19 NOTE — Assessment & Plan Note (Signed)
She has minimum hypercalcemia on her blood work done a month ago, likely due to her calcium and vitamin D supplement.  She is not symptomatic.  Recommend observation only

## 2016-09-19 NOTE — Assessment & Plan Note (Signed)
She is not symptomatic.  I recommend observation only

## 2016-09-19 NOTE — Assessment & Plan Note (Signed)
CT scan show mild lymphadenopathy progression but overall the patient is not symptomatic. I recommend close observation only and I plan to see her back next year with repeat history, physical examination and blood work

## 2016-10-14 ENCOUNTER — Other Ambulatory Visit: Payer: Self-pay | Admitting: Family Medicine

## 2016-10-14 MED ORDER — AMOXICILLIN 875 MG PO TABS: 875.0000 mg | ORAL_TABLET | Freq: Two times a day (BID) | ORAL | 0 refills | Status: DC

## 2016-10-14 NOTE — Progress Notes (Signed)
Has positive strep throat

## 2016-12-02 ENCOUNTER — Ambulatory Visit: Payer: Medicare Other | Admitting: Family Medicine

## 2016-12-06 ENCOUNTER — Ambulatory Visit (INDEPENDENT_AMBULATORY_CARE_PROVIDER_SITE_OTHER): Payer: Medicare Other | Admitting: Family Medicine

## 2016-12-06 ENCOUNTER — Encounter: Payer: Self-pay | Admitting: Family Medicine

## 2016-12-06 VITALS — BP 148/70 | HR 76 | Temp 98.4°F | Resp 14 | Ht 62.0 in | Wt 165.0 lb

## 2016-12-06 DIAGNOSIS — R7303 Prediabetes: Secondary | ICD-10-CM | POA: Diagnosis not present

## 2016-12-06 DIAGNOSIS — K219 Gastro-esophageal reflux disease without esophagitis: Secondary | ICD-10-CM

## 2016-12-06 DIAGNOSIS — I10 Essential (primary) hypertension: Secondary | ICD-10-CM

## 2016-12-06 DIAGNOSIS — E7800 Pure hypercholesterolemia, unspecified: Secondary | ICD-10-CM | POA: Diagnosis not present

## 2016-12-06 LAB — CBC WITH DIFFERENTIAL/PLATELET
BASOS ABS: 78 {cells}/uL (ref 0–200)
BASOS PCT: 1 %
EOS ABS: 234 {cells}/uL (ref 15–500)
Eosinophils Relative: 3 %
HEMATOCRIT: 43.4 % (ref 35.0–45.0)
HEMOGLOBIN: 14.2 g/dL (ref 12.0–15.0)
Lymphocytes Relative: 26 %
Lymphs Abs: 2028 cells/uL (ref 850–3900)
MCH: 29.6 pg (ref 27.0–33.0)
MCHC: 32.7 g/dL (ref 32.0–36.0)
MCV: 90.6 fL (ref 80.0–100.0)
MONO ABS: 780 {cells}/uL (ref 200–950)
MPV: 9.8 fL (ref 7.5–12.5)
Monocytes Relative: 10 %
NEUTROS ABS: 4680 {cells}/uL (ref 1500–7800)
Neutrophils Relative %: 60 %
Platelets: 268 10*3/uL (ref 140–400)
RBC: 4.79 MIL/uL (ref 3.80–5.10)
RDW: 13.8 % (ref 11.0–15.0)
WBC: 7.8 10*3/uL (ref 3.8–10.8)

## 2016-12-06 MED ORDER — METOPROLOL SUCCINATE ER 50 MG PO TB24: ORAL_TABLET | ORAL | 2 refills | Status: DC

## 2016-12-06 MED ORDER — RANITIDINE HCL 150 MG PO TABS: 150.0000 mg | ORAL_TABLET | Freq: Every day | ORAL | 3 refills | Status: DC

## 2016-12-06 MED ORDER — LOSARTAN POTASSIUM 100 MG PO TABS: 100.0000 mg | ORAL_TABLET | Freq: Every day | ORAL | 2 refills | Status: DC

## 2016-12-06 MED ORDER — PRAVASTATIN SODIUM 20 MG PO TABS: 20.0000 mg | ORAL_TABLET | Freq: Every day | ORAL | 2 refills | Status: DC

## 2016-12-06 MED ORDER — CETIRIZINE HCL 10 MG PO TABS: 10.0000 mg | ORAL_TABLET | Freq: Every day | ORAL | 2 refills | Status: DC

## 2016-12-06 NOTE — Assessment & Plan Note (Signed)
Check lipids/A1C

## 2016-12-06 NOTE — Patient Instructions (Signed)
F/U 4 months We will call with BP readings in 2 months

## 2016-12-06 NOTE — Assessment & Plan Note (Signed)
Use zantac each night, discussed referral to GI if symptoms worsens or has any bloating pain with eating with HH history

## 2016-12-06 NOTE — Assessment & Plan Note (Signed)
Mild elevation in systolic BP, check BP at home will call with readings consider increasing Toprol to 75mg 

## 2016-12-06 NOTE — Assessment & Plan Note (Signed)
Recheck calcium

## 2016-12-06 NOTE — Progress Notes (Signed)
   Subjective:    Patient ID: Cathy [name], female    DOB: 04/16/1946, 71 y.o.   MRN: [id number]  Patient presents for Follow-up (is fasting)  Her follow-up chronic medical problems. She is followed by oncology for cervical lymphadenopathy recent CT scan did show some mild enlargement of the nodes but asymptomatic therefore see her under surveillance.  She was seen by cardiology back in the fall after having some atypical chest pain. She did have normal Myoview.  Borderline diabetes mellitus last A1c was 6% she has been trying to monitor her sweets and carbs. Hypertension she is taking her blood pressure medicine as prescribed and side effects Due for repeat fasting labs today. She has not been checking recently   She did have labs done by her oncologist back in March she had mildly elevated calcium at 10.5 she is on calcium supplement therefore was advised just to recheck.  She continues to get reflux, did not see much difference with dexilant and zantac so still takes zantac on and off with tums. Told in past she had hiatal hernia  Still uses ultram prn knee pain since surgery   Review Of Systems:  GEN- denies fatigue, fever, weight loss,weakness, recent illness HEENT- denies eye drainage, change in vision, nasal discharge, CVS- denies chest pain, palpitations RESP- denies SOB, cough, wheeze ABD- denies N/V, change in stools, abd pain GU- denies dysuria, hematuria, dribbling, incontinence MSK- +joint pain, muscle aches, injury Neuro- denies headache, dizziness, syncope, seizure activity       Objective:    BP (!) 148/70   Pulse 76   Temp 98.4 F (36.9 C) (Oral)   Resp 14   Ht 5\' 2"  (1.575 m)   Wt 165 lb (74.8 kg)   SpO2 99%   BMI 30.18 kg/m  GEN- NAD, alert and oriented x3 HEENT- PERRL, EOMI, non injected sclera, pink conjunctiva, MMM, oropharynx clear Neck- Supple, no thryomegaly, left supraclavicular node palpated CVS- RRR, no murmur RESP-CTAB ABD-NABS,soft,NT,ND   EXT-  varicose veins bilat Pulses- Radial, DP- 2+      Assessment & Plan:      Problem List Items Addressed This Visit    Pre-diabetes - Primary   Relevant Orders   Hemoglobin A1c (Completed)   Hyperlipidemia    Check lipids/A1C      Relevant Medications   losartan (COZAAR) 100 MG tablet   metoprolol succinate (TOPROL-XL) 50 MG 24 hr tablet   pravastatin (PRAVACHOL) 20 MG tablet   Other Relevant Orders   Lipid panel (Completed)   Hypercalcemia    Recheck calcium      GERD (gastroesophageal reflux disease)    Use zantac each night, discussed referral to GI if symptoms worsens or has any bloating pain with eating with HH history      Relevant Medications   ranitidine (ZANTAC) 150 MG tablet   Essential hypertension    Mild elevation in systolic BP, check BP at home will call with readings consider increasing Toprol to 75mg       Relevant Medications   losartan (COZAAR) 100 MG tablet   metoprolol succinate (TOPROL-XL) 50 MG 24 hr tablet   pravastatin (PRAVACHOL) 20 MG tablet   Other Relevant Orders   CBC with Differential/Platelet (Completed)   Comprehensive metabolic panel (Completed)      Note: This dictation was prepared with Dragon dictation along with smaller phrase technology. Any transcriptional errors that result from this process are unintentional.

## 2016-12-07 LAB — LIPID PANEL
CHOLESTEROL: 157 mg/dL (ref <200)
HDL: 36 mg/dL — ABNORMAL LOW (ref >50)
LDL CALC: 87 mg/dL (ref <100)
TRIGLYCERIDES: 172 mg/dL — AB (ref <150)
Total CHOL/HDL Ratio: 4.4 Ratio (ref <5.0)
VLDL: 34 mg/dL — ABNORMAL HIGH (ref <30)

## 2016-12-07 LAB — COMPREHENSIVE METABOLIC PANEL
ALBUMIN: 4.6 g/dL (ref 3.6–5.1)
ALK PHOS: 57 U/L (ref 33–130)
ALT: 27 U/L (ref 6–29)
AST: 29 U/L (ref 10–35)
BUN: 17 mg/dL (ref 7–25)
CALCIUM: 10.6 mg/dL — AB (ref 8.6–10.4)
CHLORIDE: 101 mmol/L (ref 98–110)
CO2: 25 mmol/L (ref 20–31)
Creat: 0.87 mg/dL (ref 0.60–0.93)
Glucose, Bld: 108 mg/dL — ABNORMAL HIGH (ref 70–99)
POTASSIUM: 5 mmol/L (ref 3.5–5.3)
Sodium: 138 mmol/L (ref 135–146)
TOTAL PROTEIN: 7.6 g/dL (ref 6.1–8.1)
Total Bilirubin: 0.6 mg/dL (ref 0.2–1.2)

## 2016-12-07 LAB — HEMOGLOBIN A1C
HEMOGLOBIN A1C: 5.7 % — AB (ref <5.7)
MEAN PLASMA GLUCOSE: 117 mg/dL

## 2016-12-27 ENCOUNTER — Telehealth: Payer: Self-pay | Admitting: Family Medicine

## 2016-12-27 NOTE — Telephone Encounter (Signed)
Call placed to patient and patient made aware.  

## 2016-12-27 NOTE — Telephone Encounter (Signed)
   Okay , we will keep ,monitoring for now.

## 2016-12-27 NOTE — Telephone Encounter (Signed)
-----   Message from [place], LPN sent at 01/13/2335  4:39 PM EDT ----- Regarding: RE: F/U BP readings  Patient reports that BP averages are as follows: Week 1: 140/67, HR 69 Week 2: 139/72, HR 73  MD to be made aware.  ----- Message ----- From: Alycia Rossetti, MD Sent: 12/27/2016  10:05 AM To: Eden Lathe Six, LPN Subject: FW: F/U BP readings                            Call and get her home BP readings ----- Message ----- From: Alycia Rossetti, MD Sent: 12/27/2016 To: Alycia Rossetti, MD Subject: F/U BP readings

## 2017-03-01 ENCOUNTER — Ambulatory Visit (INDEPENDENT_AMBULATORY_CARE_PROVIDER_SITE_OTHER): Payer: Medicare Other | Admitting: *Deleted

## 2017-03-01 DIAGNOSIS — Z23 Encounter for immunization: Secondary | ICD-10-CM

## 2017-03-28 ENCOUNTER — Other Ambulatory Visit: Payer: Self-pay | Admitting: Family Medicine

## 2017-03-28 NOTE — Telephone Encounter (Signed)
test strips refilled 

## 2017-04-07 ENCOUNTER — Encounter: Payer: Self-pay | Admitting: Family Medicine

## 2017-04-07 ENCOUNTER — Ambulatory Visit (INDEPENDENT_AMBULATORY_CARE_PROVIDER_SITE_OTHER): Payer: Medicare Other | Admitting: Family Medicine

## 2017-04-07 VITALS — BP 138/74 | HR 82 | Temp 98.4°F | Resp 16 | Ht 62.0 in | Wt 171.0 lb

## 2017-04-07 DIAGNOSIS — I10 Essential (primary) hypertension: Secondary | ICD-10-CM | POA: Diagnosis not present

## 2017-04-07 DIAGNOSIS — J209 Acute bronchitis, unspecified: Secondary | ICD-10-CM | POA: Diagnosis not present

## 2017-04-07 DIAGNOSIS — J440 Chronic obstructive pulmonary disease with acute lower respiratory infection: Secondary | ICD-10-CM | POA: Diagnosis not present

## 2017-04-07 DIAGNOSIS — E7800 Pure hypercholesterolemia, unspecified: Secondary | ICD-10-CM | POA: Diagnosis not present

## 2017-04-07 DIAGNOSIS — R7303 Prediabetes: Secondary | ICD-10-CM

## 2017-04-07 MED ORDER — PREDNISONE 20 MG PO TABS: 40.0000 mg | ORAL_TABLET | Freq: Every day | ORAL | 0 refills | Status: DC

## 2017-04-07 MED ORDER — BENZONATATE 200 MG PO CAPS: 200.0000 mg | ORAL_CAPSULE | Freq: Three times a day (TID) | ORAL | 0 refills | Status: DC | PRN

## 2017-04-07 MED ORDER — AZITHROMYCIN 250 MG PO TABS: ORAL_TABLET | ORAL | 0 refills | Status: DC

## 2017-04-07 MED ORDER — VITAMIN D3 25 MCG (1000 UNIT) PO TABS: 1000.0000 [IU] | ORAL_TABLET | Freq: Every day | ORAL | Status: DC

## 2017-04-07 NOTE — Progress Notes (Signed)
   Subjective:    Patient ID: Cathy [name], female    DOB: 1945-08-06, 71 y.o.   MRN: [id number]  Patient presents for Follow-up (is fasting) and Illness (x1 week- productive cough with green colored mucus)  Patient here to follow-up chronic medical problems.  Medications reviewed  Hypertension she is currently on losartan 100 mg and Toprol 50 mg once a day She is still taking aspirin 81 mg  Lipidemia pravastatin 20 mg daily  Borderline diabetes mellitus last A1c 5.7% in June, her weight is up 6 pounds since her last visit  Cough with production green colored mucus for the2 weeks.  She has been taking OTC medsshe has not had any fever, no fever, occasional chills/aches , some wheezing and has required her albuterol  She is followed by oncology for cervical lymphadenopathy    Review Of Systems:  GEN- denies fatigue, fever, weight loss,weakness, recent illness HEENT- denies eye drainage, change in vision,+ nasal discharge, CVS- denies chest pain, palpitations RESP- denies SOB, +cough,+ wheeze ABD- denies N/V, change in stools, abd pain GU- denies dysuria, hematuria, dribbling, incontinence MSK- denies joint pain, muscle aches, injury Neuro- denies headache, dizziness, syncope, seizure activity       Objective:    BP 138/74   Pulse 82   Temp 98.4 F (36.9 C) (Oral)   Resp 16   Ht 5\' 2"  (1.575 m)   Wt 171 lb (77.6 kg)   SpO2 99%   BMI 31.28 kg/m  GEN- NAD, alert and oriented x3 HEENT- PERRL, EOMI, non injected sclera, pink conjunctiva, MMM, oropharynx clear, TM clear bilat no effusion Neck- Supple, no thryomegaly, left supraclavicular node palpated, no other nodes CVS- RRR, no murmur RESP-scattered bialt wheeze and rhonchi,normal WOB, good air movement  ABD-NABS,soft,NT,ND  EXT-  varicose veins bilat Pulses- Radial, DP- 2+        Assessment & Plan:      Problem List Items Addressed This Visit      Unprioritized   Pre-diabetes - Primary   Relevant Orders   Hemoglobin A1c (Completed)   Hyperlipidemia    Check lipids She is trying to watch sweets and carbs As borderline diabetic as well Due for recheck on A1C       Relevant Orders   Lipid panel (Completed)   Essential hypertension    Controlled for age, mild fluctations, but overall doing well No change to medications      Relevant Orders   CBC with Differential/Platelet (Completed)   Comprehensive metabolic panel (Completed)    Other Visit Diagnoses    Acute bronchitis with COPD ([place])       Bronchitis in setting of asthma, zpak, prednisone,albuterol, tessalon   Relevant Medications   predniSONE (DELTASONE) 20 MG tablet   azithromycin (ZITHROMAX) 250 MG tablet   benzonatate (TESSALON) 200 MG capsule      Note: This dictation was prepared with Dragon dictation along with smaller phrase technology. Any transcriptional errors that result from this process are unintentional.

## 2017-04-07 NOTE — Patient Instructions (Addendum)
F/U  Annual Wellness Exam Dec 31st

## 2017-04-08 LAB — COMPREHENSIVE METABOLIC PANEL
AG Ratio: 1.5 (calc) (ref 1.0–2.5)
ALKALINE PHOSPHATASE (APISO): 67 U/L (ref 33–130)
ALT: 25 U/L (ref 6–29)
AST: 27 U/L (ref 10–35)
Albumin: 4.3 g/dL (ref 3.6–5.1)
BILIRUBIN TOTAL: 0.6 mg/dL (ref 0.2–1.2)
BUN: 12 mg/dL (ref 7–25)
CALCIUM: 9.9 mg/dL (ref 8.6–10.4)
CO2: 25 mmol/L (ref 20–32)
Chloride: 103 mmol/L (ref 98–110)
Creat: 0.84 mg/dL (ref 0.60–0.93)
Globulin: 2.9 g/dL (calc) (ref 1.9–3.7)
Glucose, Bld: 122 mg/dL — ABNORMAL HIGH (ref 65–99)
Potassium: 5.2 mmol/L (ref 3.5–5.3)
Sodium: 138 mmol/L (ref 135–146)
Total Protein: 7.2 g/dL (ref 6.1–8.1)

## 2017-04-08 LAB — CBC WITH DIFFERENTIAL/PLATELET
BASOS PCT: 0.7 %
Basophils Absolute: 64 cells/uL (ref 0–200)
Eosinophils Absolute: 386 cells/uL (ref 15–500)
Eosinophils Relative: 4.2 %
HCT: 41.5 % (ref 35.0–45.0)
Hemoglobin: 14 g/dL (ref 11.7–15.5)
Lymphs Abs: 1647 cells/uL (ref 850–3900)
MCH: 29.7 pg (ref 27.0–33.0)
MCHC: 33.7 g/dL (ref 32.0–36.0)
MCV: 88.1 fL (ref 80.0–100.0)
MPV: 9.7 fL (ref 7.5–12.5)
Monocytes Relative: 9.7 %
NEUTROS ABS: 6210 {cells}/uL (ref 1500–7800)
Neutrophils Relative %: 67.5 %
PLATELETS: 284 10*3/uL (ref 140–400)
RBC: 4.71 10*6/uL (ref 3.80–5.10)
RDW: 12.2 % (ref 11.0–15.0)
TOTAL LYMPHOCYTE: 17.9 %
WBC: 9.2 10*3/uL (ref 3.8–10.8)
WBCMIX: 892 {cells}/uL (ref 200–950)

## 2017-04-08 LAB — HEMOGLOBIN A1C
EAG (MMOL/L): 6.6 (calc)
HEMOGLOBIN A1C: 5.8 %{Hb} — AB (ref <5.7)
MEAN PLASMA GLUCOSE: 120 (calc)

## 2017-04-08 LAB — LIPID PANEL
CHOLESTEROL: 150 mg/dL (ref <200)
HDL: 34 mg/dL — ABNORMAL LOW (ref >50)
LDL Cholesterol (Calc): 90 mg/dL (calc)
Non-HDL Cholesterol (Calc): 116 mg/dL (calc) (ref <130)
Total CHOL/HDL Ratio: 4.4 (calc) (ref <5.0)
Triglycerides: 154 mg/dL — ABNORMAL HIGH (ref <150)

## 2017-04-09 ENCOUNTER — Encounter: Payer: Self-pay | Admitting: Family Medicine

## 2017-04-09 NOTE — Assessment & Plan Note (Signed)
Controlled for age, mild fluctations, but overall doing well No change to medications

## 2017-04-09 NOTE — Assessment & Plan Note (Signed)
Check lipids She is trying to watch sweets and carbs As borderline diabetic as well Due for recheck on A1C

## 2017-06-01 ENCOUNTER — Other Ambulatory Visit: Payer: Self-pay | Admitting: Family Medicine

## 2017-06-01 DIAGNOSIS — Z139 Encounter for screening, unspecified: Secondary | ICD-10-CM

## 2017-06-12 ENCOUNTER — Other Ambulatory Visit: Payer: Self-pay

## 2017-06-12 ENCOUNTER — Encounter: Payer: Self-pay | Admitting: Family Medicine

## 2017-06-12 ENCOUNTER — Ambulatory Visit (INDEPENDENT_AMBULATORY_CARE_PROVIDER_SITE_OTHER): Payer: Medicare Other | Admitting: Family Medicine

## 2017-06-12 VITALS — BP 138/62 | HR 70 | Temp 98.1°F | Resp 16 | Ht 62.0 in | Wt 174.0 lb

## 2017-06-12 DIAGNOSIS — Z23 Encounter for immunization: Secondary | ICD-10-CM

## 2017-06-12 DIAGNOSIS — J4540 Moderate persistent asthma, uncomplicated: Secondary | ICD-10-CM

## 2017-06-12 DIAGNOSIS — R0981 Nasal congestion: Secondary | ICD-10-CM

## 2017-06-12 DIAGNOSIS — Z0000 Encounter for general adult medical examination without abnormal findings: Secondary | ICD-10-CM | POA: Diagnosis not present

## 2017-06-12 DIAGNOSIS — M8589 Other specified disorders of bone density and structure, multiple sites: Secondary | ICD-10-CM

## 2017-06-12 MED ORDER — FLUTICASONE FUROATE-VILANTEROL 100-25 MCG/INH IN AEPB: 1.0000 | INHALATION_SPRAY | Freq: Every day | RESPIRATORY_TRACT | 2 refills | Status: DC

## 2017-06-12 MED ORDER — ALBUTEROL SULFATE HFA 108 (90 BASE) MCG/ACT IN AERS: 2.0000 | INHALATION_SPRAY | RESPIRATORY_TRACT | 3 refills | Status: DC | PRN

## 2017-06-12 NOTE — Progress Notes (Signed)
Subjective:   Patient presents for Medicare Annual/Subsequent preventive examination.   Pt here for annual wellness exam   Her fasting labs done in 2 months ago for her borderline DM, A1C 5.8%  Hyperlipidemia- TG were improved at  154 Normal kidney and liver function    Discussed Hep C screening    Asthma has been bothering her more, treated for bronchitis , uses rescue inhaler 2 times a week , animal dander is a trigger   Review Past Medical/Family/Social: Per EMR    Risk Factors  Current exercise habits: walks some  Dietary issues discussed: Yes   Cardiac risk factors: Obesity (BMI >= 30 kg/m2).   Depression Screen  (Note: if answer to either of the following is "Yes", a more complete depression screening is indicated)  Over the past two weeks, have you felt down, depressed or hopeless? No Over the past two weeks, have you felt little interest or pleasure in doing things? No Have you lost interest or pleasure in daily life? No Do you often feel hopeless? No Do you cry easily over simple problems? No   Activities of Daily Living  In your present state of health, do you have any difficulty performing the following activities?:  Driving? No  Managing money? No  Feeding yourself? No  Getting from bed to chair? No  Climbing a flight of stairs? No  Preparing food and eating?: No  Bathing or showering? No  Getting dressed: No  Getting to the toilet? No  Using the toilet:No  Moving around from place to place: No  In the past year have you fallen or had a near fall?:No  Are you sexually active? No  Do you have more than one partner? No   Hearing Difficulties: No  Do you often ask people to speak up or repeat themselves? No  Do you experience ringing or noises in your ears? No Do you have difficulty understanding soft or whispered voices? No  Do you feel that you have a problem with memory? No Do you often misplace items? No  Do you feel safe at home? Yes  Cognitive  Testing  Alert? Yes Normal Appearance?Yes  Oriented to person? Yes Place? Yes  Time? Yes  Recall of three objects? Yes  Can perform simple calculations? Yes  Displays appropriate judgment?Yes  Can read the correct time from a watch face?Yes   List the Names of Other Physician/Practitioners you currently use:  [facility]  Dr. Herbert Deaner   Screening Tests / Date Colonoscopy  Due in 2023              Zostavax  UTD Bone Density - UTD  Due in Jan  2020 Pneumonia-  UTD  Mammogram UTD  Influenza Vaccin UTD  UTD Tetanus/tdap UTD  ROS: GEN- denies fatigue, fever, weight loss,weakness, recent illness HEENT- denies eye drainage, change in vision, nasal discharge, CVS- denies chest pain, palpitations RESP- denies SOB, +cough, wheeze ABD- denies N/V, change in stools, abd pain GU- denies dysuria, hematuria, dribbling, incontinence MSK- denies joint pain, muscle aches, injury Neuro- denies headache, dizziness, syncope, seizure activity  PHYSICAL: GEN- NAD, alert and oriented x3 HEENT- PERRL, EOMI, non injected sclera, pink conjunctiva, MMM, oropharynx clear Neck- Supple, no thryomegaly, left supraclavicular node palpated  CVS- RRR, no murmur RESP-CTAB ABD-NABS,soft,NT,ND EXT- No edema Pulses- Radial, DP- 2+  Peak Flow 250/250/300  Assessment:    Annual wellness medicare exam   Plan:    During the course of the visit the patient  was educated and counseled about appropriate screening and preventive services including:   Prevention is up-to-date.  Per her gastroenterologist she does not need another colonoscopy for another 5 years. She does want a mammogram yearly and this is scheduled for the end of January.  Bone density she has osteopenia repeat bone density in 2020  Advanced directives in place full code  Negative depression screen.  Blood pressure still controlled.  Discussed her diet and increasing weight.  No sign of any fluid overload.  With regards to her  asthma we will try her on a preventative inhaler Breo given samples.  We will also give her albuterol with a spacer to help with delivery  Shingrix vaccine given,2nd dose in 3-6 months   Diet review for nutrition referral? Yes ____ Not Indicated __x__  Patient Instructions (the written plan) was given to the patient.  Medicare Attestation  I have personally reviewed:  The patient's medical and social history  Their use of alcohol, tobacco or illicit drugs  Their current medications and supplements  The patient's functional ability including ADLs,fall risks, home safety risks, cognitive, and hearing and visual impairment  Diet and physical activities  Evidence for depression or mood disorders  The patient's weight, height, BMI, and visual acuity have been recorded in the chart. I have made referrals, counseling, and provided education to the patient based on review of the above and I have provided the patient with a written personalized care plan for preventive services.

## 2017-06-12 NOTE — Patient Instructions (Addendum)
F/U May  Shingrix vaccine given  Try the Breo, 1 puff daily ALbuterol to be used with a spacer  Use nasal saline

## 2017-07-13 ENCOUNTER — Ambulatory Visit
Admission: RE | Admit: 2017-07-13 | Discharge: 2017-07-13 | Disposition: A | Discharge status: Home / Self Care | Payer: Medicare Other | Source: Ambulatory Visit | Attending: Family Medicine | Admitting: Family Medicine

## 2017-07-13 DIAGNOSIS — Z139 Encounter for screening, unspecified: Secondary | ICD-10-CM

## 2017-07-31 NOTE — Progress Notes (Signed)
Cardiology Office Note   Date:  08/07/2017   ID:  Cathy [name], Cathy [name] 03-Sep-1945, MRN [id number]  PCP:  Alycia Rossetti, MD  Cardiologist:   Jenkins Rouge, MD   No chief complaint on file.     History of Present Illness:  72 y.o. first seen 08/09/16 for chest pain. Atypical with GI overtones GERD CRF;s HTN DM-2  Previous echo 11/03/14 EF 55-60% Had f/u myovue 08/16/16 normal no ischemia EF 60%   Has had adenopathy in neck chest and upper abdomen with biopsy showing atypical lymphoid hyperplasia.  Sees oncology [name] for this.  Asthma has been worse using inhaler given Breo by Dr Lonell Grandchild 06/12/17   She is sedentary and eats too much Son MD in [place] has a 1 yo and 110 yo grand-children    Past Medical History:  Diagnosis Date  . Abnormal glucose   . Allergy   . Asthma    Allergy induced  . Benign breast cyst in female   . GERD (gastroesophageal reflux disease)   . Hilar lymphadenopathy 08/01/2011  . History of nuclear stress test 2009   Treadmill and Stress Myoview- no CAD  . Hyperlipidemia   . Hypertension   . Lymphadenopathy of left cervical region 08/01/2011  . Nephrolithiasis 1984  . Osteopenia   . PONV (postoperative nausea and vomiting)   . Post-menopause   . Pre-diabetes    borderline  . 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 Vision changes     Past Surgical History:  Procedure Laterality Date  . CHOLECYSTECTOMY    . LITHOTRIPSY  1984  . LYMPH NODE BIOPSY    . 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 ACCU-CHEK AVIVA PLUS test strip USE TO MONITOR BLOOD SUGAR THREE TIMES AWEEK 50 each 6  . albuterol (PROVENTIL HFA;VENTOLIN HFA) 108 (90 Base) MCG/ACT inhaler Inhale 2 puffs into the lungs every 4 (four) hours as needed for wheezing or  shortness of breath (with spacer). 1 Inhaler 3  . aspirin EC 81 MG tablet Take 81 mg by mouth daily.    . cetirizine (ZYRTEC) 10 MG tablet Take 1 tablet (10 mg total) by mouth daily. 90 tablet 2  . cholecalciferol (VITAMIN D) 1000 units tablet Take 1 tablet (1,000 Units total) by mouth daily.    Marland Kitchen CINNAMON PO Take 500 mg by mouth daily.    . fluticasone furoate-vilanterol (BREO ELLIPTA) 100-25 MCG/INH AEPB Inhale 1 puff into the lungs daily. 30 each 2  . Glucosamine-Chondroitin (GLUCOSAMINE CHONDR COMPLEX PO) Take 400 mg by mouth daily.    Marland Kitchen losartan (COZAAR) 100 MG tablet Take 1 tablet (100 mg total) by mouth daily. 90 tablet 2  . metoprolol succinate (TOPROL-XL) 50 MG 24 hr tablet TAKE 1 TABLET BY MOUTH ONCE A DAY WITH OR IMMEDIATELY FOLLOWING A MEAL 90 tablet 2  . Multiple Vitamin (MULTIVITAMIN) capsule Take 1 capsule by mouth daily.    . Multiple Vitamins-Minerals (HAIR SKIN AND NAILS FORMULA) TABS Take by mouth 3 (three) times daily.    . Omega-3 Fatty Acids (FISH OIL PO) Take 1,000 mg by mouth daily.    . pravastatin (PRAVACHOL) 20 MG tablet Take 1 tablet (20 mg total) by mouth daily. 90 tablet 2  . predniSONE (DELTASONE) 20  MG tablet Take 2 tablets (40 mg total) by mouth daily with breakfast. 10 tablet 0  . ranitidine (ZANTAC) 150 MG tablet Take 1 tablet (150 mg total) by mouth daily. 90 tablet 3  . TURMERIC PO Take 1 tablet by mouth daily.     No current facility-administered medications for this visit.     Allergies:   Codeine    Social History:  The patient  reports that  has never smoked. she has never used smokeless tobacco. She reports that she does not drink alcohol or use drugs.   Family History:  The patient's family history includes Alcohol abuse in her brother; Asthma in her sister; Breast cancer in her cousin; Cancer in her father, maternal aunt, and maternal aunt; Dementia in her mother; Diabetes in her brother; Heart attack in her mother; Hypertension in her mother;  Stroke in her mother.    ROS:  Please see the history of present illness.   Otherwise, review of systems are positive for none.   All other systems are reviewed and negative.    PHYSICAL EXAM: VS:  BP (!) 142/76   Pulse 86   Ht 5\' 2"  (1.575 m)   Wt 179 lb 3.2 oz (81.3 kg)   SpO2 98%   BMI 32.78 kg/m  , BMI Body mass index is 32.78 kg/m. Affect appropriate Healthy:  appears stated age 38: normal Neck supple with cervical lymphadenopathy  JVP normal no bruits no thyromegaly Lungs clear with no wheezing and good diaphragmatic motion Heart:  S1/S2 no murmur, no rub, gallop or click PMI normal Abdomen: benighn, BS positve, no tenderness, no AAA no bruit.  No HSM or HJR Distal pulses intact with no bruits No edema Neuro non-focal Skin warm and dry No muscular weakness     EKG:  06/03/16 SR rate 64 LVH poor R wave progression 08/07/17  SR rate 78 LAD LVH    Recent Labs: 04/07/2017: ALT 25; BUN 12; Creat 0.84; Hemoglobin 14.0; Platelets 284; Potassium 5.2; Sodium 138    Lipid Panel    Component Value Date/Time   CHOL 150 04/07/2017 0835   TRIG 154 (H) 04/07/2017 0835   HDL 34 (L) 04/07/2017 0835   CHOLHDL 4.4 04/07/2017 0835   VLDL 34 (H) 12/06/2016 0932   LDLCALC 87 12/06/2016 0932      Wt Readings from Last 3 Encounters:  08/07/17 179 lb 3.2 oz (81.3 kg)  06/12/17 174 lb (78.9 kg)  04/07/17 171 lb (77.6 kg)      Other studies Reviewed: Additional studies/ records that were reviewed today include: notes Dr Buelah Manis labs and ECG Echo 2016.    ASSESSMENT AND PLAN:  1.  Chest Pain atypical normal myovue 08/16/16 observe  2. GERD: continue H2 blocker low carb diet avoid excess NSAI's 3. DM  Discussed low carb diet.  Target hemoglobin A1c is 6.5 or less.  Continue current medications. 4. Lymphadenopathy f/u [name] needs f/u CT scanning April 2019  5. HTN:  Well controlled.  Continue current medications and low sodium Dash type diet.   6. Lipids:  On statin  LDL at goal with no documented vascular disease    F/u in a year  Jenkins Rouge

## 2017-08-07 ENCOUNTER — Encounter: Payer: Self-pay | Admitting: Cardiovascular Disease

## 2017-08-07 ENCOUNTER — Ambulatory Visit: Payer: Medicare Other | Admitting: Cardiovascular Disease

## 2017-08-07 VITALS — BP 142/76 | HR 86 | Ht 62.0 in | Wt 179.2 lb

## 2017-08-07 DIAGNOSIS — R079 Chest pain, unspecified: Secondary | ICD-10-CM

## 2017-08-07 DIAGNOSIS — I10 Essential (primary) hypertension: Secondary | ICD-10-CM | POA: Diagnosis not present

## 2017-08-07 NOTE — Patient Instructions (Addendum)

## 2017-08-08 NOTE — Addendum Note (Signed)
Addended by: Joaquim Lai on: 08/08/2017 01:39 PM   Modules accepted: Orders

## 2017-09-18 ENCOUNTER — Other Ambulatory Visit: Payer: Self-pay | Admitting: Hematology and Oncology

## 2017-09-18 DIAGNOSIS — R918 Other nonspecific abnormal finding of lung field: Secondary | ICD-10-CM

## 2017-09-18 DIAGNOSIS — R590 Localized enlarged lymph nodes: Secondary | ICD-10-CM

## 2017-09-19 ENCOUNTER — Telehealth: Payer: Self-pay | Admitting: *Deleted

## 2017-09-19 ENCOUNTER — Encounter: Payer: Self-pay | Admitting: Hematology and Oncology

## 2017-09-19 ENCOUNTER — Inpatient Hospital Stay: Payer: Medicare Other

## 2017-09-19 ENCOUNTER — Inpatient Hospital Stay: Payer: Medicare Other | Attending: Hematology and Oncology | Admitting: Hematology and Oncology

## 2017-09-19 ENCOUNTER — Telehealth: Payer: Self-pay | Admitting: Hematology and Oncology

## 2017-09-19 DIAGNOSIS — R748 Abnormal levels of other serum enzymes: Secondary | ICD-10-CM | POA: Diagnosis not present

## 2017-09-19 DIAGNOSIS — R590 Localized enlarged lymph nodes: Secondary | ICD-10-CM | POA: Insufficient documentation

## 2017-09-19 DIAGNOSIS — Z7982 Long term (current) use of aspirin: Secondary | ICD-10-CM

## 2017-09-19 DIAGNOSIS — Z79899 Other long term (current) drug therapy: Secondary | ICD-10-CM | POA: Insufficient documentation

## 2017-09-19 DIAGNOSIS — R918 Other nonspecific abnormal finding of lung field: Secondary | ICD-10-CM

## 2017-09-19 LAB — COMPREHENSIVE METABOLIC PANEL
ALBUMIN: 4.4 g/dL (ref 3.5–5.0)
ALT: 63 U/L — AB (ref 0–55)
AST: 54 U/L — AB (ref 5–34)
Alkaline Phosphatase: 64 U/L (ref 40–150)
Anion gap: 10 (ref 3–11)
BILIRUBIN TOTAL: 0.7 mg/dL (ref 0.2–1.2)
BUN: 17 mg/dL (ref 7–26)
CALCIUM: 10.8 mg/dL — AB (ref 8.4–10.4)
CO2: 27 mmol/L (ref 22–29)
CREATININE: 0.96 mg/dL (ref 0.60–1.10)
Chloride: 104 mmol/L (ref 98–109)
GFR calc Af Amer: >60 mL/min (ref >60)
GFR, EST NON AFRICAN AMERICAN: 58 mL/min — AB (ref >60)
GLUCOSE: 123 mg/dL (ref 70–140)
Potassium: 5.2 mmol/L — ABNORMAL HIGH (ref 3.5–5.1)
Sodium: 141 mmol/L (ref 136–145)
TOTAL PROTEIN: 7.9 g/dL (ref 6.4–8.3)

## 2017-09-19 LAB — CBC WITH DIFFERENTIAL/PLATELET
BASOS ABS: 0 10*3/uL (ref 0.0–0.1)
Basophils Relative: 0 %
EOS PCT: 4 %
Eosinophils Absolute: 0.3 10*3/uL (ref 0.0–0.5)
HCT: 44.5 % (ref 34.8–46.6)
Hemoglobin: 14.7 g/dL (ref 11.6–15.9)
LYMPHS ABS: 1.8 10*3/uL (ref 0.9–3.3)
LYMPHS PCT: 24 %
MCH: 29.8 pg (ref 25.1–34.0)
MCHC: 33.1 g/dL (ref 31.5–36.0)
MCV: 90 fL (ref 79.5–101.0)
MONO ABS: 0.9 10*3/uL (ref 0.1–0.9)
Monocytes Relative: 12 %
NEUTROS ABS: 4.5 10*3/uL (ref 1.5–6.5)
Neutrophils Relative %: 60 %
Platelets: 236 10*3/uL (ref 145–400)
RBC: 4.95 MIL/uL (ref 3.70–5.45)
RDW: 13.9 % (ref 11.2–14.5)
WBC: 7.5 10*3/uL (ref 3.9–10.3)

## 2017-09-19 LAB — LACTATE DEHYDROGENASE: LDH: 235 U/L (ref 125–245)

## 2017-09-19 NOTE — Assessment & Plan Note (Signed)
CT scan in April 2018 showed mild lymphadenopathy progression but overall the patient is not symptomatic. Examination showed no significant disease progression She is educated to watch out for signs and symptoms of disease progression I recommend close observation only and I plan to see her back next year with repeat history, physical examination and blood work

## 2017-09-19 NOTE — Telephone Encounter (Signed)
LM on home phone. To call if she has questions

## 2017-09-19 NOTE — Telephone Encounter (Signed)
-----   Message from Heath Lark, MD sent at 09/19/2017 10:52 AM EDT ----- Regarding: CMP Let her know calcium is a bit high (not new) Also, liver enzymes a bit high (could be due to weight gain, recommend dietary change as discussed) Recommend follow-up with pcp ----- Message ----- From: Interface, Lab In [facility] Sent: 09/19/2017   9:45 AM To: Heath Lark, MD

## 2017-09-19 NOTE — Telephone Encounter (Signed)
Gave patient AVs and calendar of upcoming April 2020 appointments.  °

## 2017-09-19 NOTE — Progress Notes (Signed)
[facility] OFFICE PROGRESS NOTE  Patient Care Team: Verdigris, Modena Nunnery, MD as PCP - General (Family Medicine) Heath Lark, MD as Consulting Physician (Hematology and Oncology)  ASSESSMENT & PLAN:  Lymphadenopathy of left cervical region CT scan in April 2018 showed mild lymphadenopathy progression but overall the patient is not symptomatic. Examination showed no significant disease progression She is educated to watch out for signs and symptoms of disease progression I recommend close observation only and I plan to see her back next year with repeat history, physical examination and blood work  Hypercalcemia She has intermittent hypercalcemia again I would defer to her primary care doctor for further management  Elevated liver enzymes She has mild elevated liver enzymes She has gained a lot of weight I suspect that could be due to fatty liver disease I recommend close follow-up with primary care doctor for further management.   No orders of the defined types were placed in this encounter.   INTERVAL HISTORY: Please see below for problem oriented charting. She returns for further follow-up She denies new lymphadenopathy Appetite is stable without recent weight loss Denies recent infection  SUMMARY OF ONCOLOGIC HISTORY:  No history exists.    REVIEW OF SYSTEMS:   Constitutional: Denies fevers, chills or abnormal weight loss Eyes: Denies blurriness of vision Ears, nose, mouth, throat, and face: Denies mucositis or sore throat Respiratory: Denies cough, dyspnea or wheezes Cardiovascular: Denies palpitation, chest discomfort or lower extremity swelling Gastrointestinal:  Denies nausea, heartburn or change in bowel habits Skin: Denies abnormal skin rashes Lymphatics: Denies new lymphadenopathy or easy bruising Neurological:Denies numbness, tingling or new weaknesses Behavioral/Psych: Mood is stable, no new changes  All other systems were reviewed with the  patient and are negative.  I have reviewed the past medical history, past surgical history, social history and family history with the patient and they are unchanged from previous note.  ALLERGIES:  is allergic to codeine.  MEDICATIONS:  Current Outpatient Medications  Medication Sig Dispense Refill  . ACCU-CHEK AVIVA PLUS test strip USE TO MONITOR BLOOD SUGAR THREE TIMES AWEEK 50 each 6  . albuterol (PROVENTIL HFA;VENTOLIN HFA) 108 (90 Base) MCG/ACT inhaler Inhale 2 puffs into the lungs every 4 (four) hours as needed for wheezing or shortness of breath (with spacer). 1 Inhaler 3  . aspirin EC 81 MG tablet Take 81 mg by mouth daily.    . cetirizine (ZYRTEC) 10 MG tablet Take 1 tablet (10 mg total) by mouth daily. 90 tablet 2  . cholecalciferol (VITAMIN D) 1000 units tablet Take 1 tablet (1,000 Units total) by mouth daily.    Marland Kitchen CINNAMON PO Take 500 mg by mouth daily.    . fluticasone furoate-vilanterol (BREO ELLIPTA) 100-25 MCG/INH AEPB Inhale 1 puff into the lungs daily. 30 each 2  . Glucosamine-Chondroitin (GLUCOSAMINE CHONDR COMPLEX PO) Take 400 mg by mouth daily.    Marland Kitchen losartan (COZAAR) 100 MG tablet Take 1 tablet (100 mg total) by mouth daily. 90 tablet 2  . metoprolol succinate (TOPROL-XL) 50 MG 24 hr tablet TAKE 1 TABLET BY MOUTH ONCE A DAY WITH OR IMMEDIATELY FOLLOWING A MEAL 90 tablet 2  . Multiple Vitamin (MULTIVITAMIN) capsule Take 1 capsule by mouth daily.    . Multiple Vitamins-Minerals (HAIR SKIN AND NAILS FORMULA) TABS Take by mouth 3 (three) times daily.    . Omega-3 Fatty Acids (FISH OIL PO) Take 1,000 mg by mouth daily.    . pravastatin (PRAVACHOL) 20 MG tablet Take 1 tablet (  20 mg total) by mouth daily. 90 tablet 2  . predniSONE (DELTASONE) 20 MG tablet Take 2 tablets (40 mg total) by mouth daily with breakfast. 10 tablet 0  . ranitidine (ZANTAC) 150 MG tablet Take 1 tablet (150 mg total) by mouth daily. 90 tablet 3  . TURMERIC PO Take 1 tablet by mouth daily.     No  current facility-administered medications for this visit.     PHYSICAL EXAMINATION: ECOG PERFORMANCE STATUS: 1 - Symptomatic but completely ambulatory  Vitals:   09/19/17 0954  BP: (!) 148/77  Pulse: 72  Resp: 18  Temp: 97.9 F (36.6 C)  SpO2: 98%   Filed Weights   09/19/17 0954  Weight: 180 lb 12.8 oz (82 kg)    GENERAL:alert, no distress and comfortable SKIN: skin color, texture, turgor are normal, no rashes or significant lesions EYES: normal, Conjunctiva are pink and non-injected, sclera clear OROPHARYNX:no exudate, no erythema and lips, buccal mucosa, and tongue normal  NECK: supple, thyroid normal size, non-tender, without nodularity LYMPH: She has fullness on the right supraclavicular region, stable LUNGS: clear to auscultation and percussion with normal breathing effort HEART: regular rate & rhythm and no murmurs and no lower extremity edema ABDOMEN:abdomen soft, non-tender and normal bowel sounds Musculoskeletal:no cyanosis of digits and no clubbing  NEURO: alert & oriented x 3 with fluent speech, no focal motor/sensory deficits  LABORATORY DATA:  I have reviewed the data as listed    Component Value Date/Time   NA 141 09/19/2017 0935   NA 143 08/15/2016 1037   K 5.2 (H) 09/19/2017 0935   K 4.8 08/15/2016 1037   CL 104 09/19/2017 0935   CL 103 07/16/2012 1304   CO2 27 09/19/2017 0935   CO2 27 08/15/2016 1037   GLUCOSE 123 09/19/2017 0935   GLUCOSE 111 08/15/2016 1037   GLUCOSE 112 (H) 07/16/2012 1304   BUN 17 09/19/2017 0935   BUN 12.7 08/15/2016 1037   CREATININE 0.96 09/19/2017 0935   CREATININE 0.84 04/07/2017 0835   CREATININE 0.9 08/15/2016 1037   CALCIUM 10.8 (H) 09/19/2017 0935   CALCIUM 10.5 (H) 08/15/2016 1037   PROT 7.9 09/19/2017 0935   PROT 7.9 08/15/2016 1037   ALBUMIN 4.4 09/19/2017 0935   ALBUMIN 4.5 08/15/2016 1037   AST 54 (H) 09/19/2017 0935   AST 30 08/15/2016 1037   ALT 63 (H) 09/19/2017 0935   ALT 29 08/15/2016 1037   ALKPHOS  64 09/19/2017 0935   ALKPHOS 67 08/15/2016 1037   BILITOT 0.7 09/19/2017 0935   BILITOT 0.80 08/15/2016 1037   GFRNONAA 58 (L) 09/19/2017 0935   GFRNONAA 85 03/05/2015 1601   GFRAA >60 09/19/2017 0935   GFRAA >89 03/05/2015 1601    No results found for: SPEP, UPEP  Lab Results  Component Value Date   WBC 7.5 09/19/2017   NEUTROABS 4.5 09/19/2017   HGB 14.7 09/19/2017   HCT 44.5 09/19/2017   MCV 90.0 09/19/2017   PLT 236 09/19/2017      Chemistry      Component Value Date/Time   NA 141 09/19/2017 0935   NA 143 08/15/2016 1037   K 5.2 (H) 09/19/2017 0935   K 4.8 08/15/2016 1037   CL 104 09/19/2017 0935   CL 103 07/16/2012 1304   CO2 27 09/19/2017 0935   CO2 27 08/15/2016 1037   BUN 17 09/19/2017 0935   BUN 12.7 08/15/2016 1037   CREATININE 0.96 09/19/2017 0935   CREATININE 0.84 04/07/2017 0835  CREATININE 0.9 08/15/2016 1037      Component Value Date/Time   CALCIUM 10.8 (H) 09/19/2017 0935   CALCIUM 10.5 (H) 08/15/2016 1037   ALKPHOS 64 09/19/2017 0935   ALKPHOS 67 08/15/2016 1037   AST 54 (H) 09/19/2017 0935   AST 30 08/15/2016 1037   ALT 63 (H) 09/19/2017 0935   ALT 29 08/15/2016 1037   BILITOT 0.7 09/19/2017 0935   BILITOT 0.80 08/15/2016 1037       All questions were answered. The patient knows to call the clinic with any problems, questions or concerns. No barriers to learning was detected.  I spent 15 minutes counseling the patient face to face. The total time spent in the appointment was 20 minutes and more than 50% was on counseling and review of test results  Heath Lark, MD 09/19/2017 11:07 AM

## 2017-09-19 NOTE — Assessment & Plan Note (Signed)
She has intermittent hypercalcemia again I would defer to her primary care doctor for further management

## 2017-09-19 NOTE — Assessment & Plan Note (Signed)
She has mild elevated liver enzymes She has gained a lot of weight I suspect that could be due to fatty liver disease I recommend close follow-up with primary care doctor for further management.

## 2017-10-06 ENCOUNTER — Encounter: Payer: Self-pay | Admitting: Family Medicine

## 2017-10-06 ENCOUNTER — Ambulatory Visit: Payer: Medicare Other | Admitting: Family Medicine

## 2017-10-06 ENCOUNTER — Other Ambulatory Visit: Payer: Self-pay

## 2017-10-06 DIAGNOSIS — E7800 Pure hypercholesterolemia, unspecified: Secondary | ICD-10-CM

## 2017-10-06 DIAGNOSIS — J4540 Moderate persistent asthma, uncomplicated: Secondary | ICD-10-CM | POA: Diagnosis not present

## 2017-10-06 DIAGNOSIS — R7303 Prediabetes: Secondary | ICD-10-CM

## 2017-10-06 DIAGNOSIS — Z6833 Body mass index (BMI) 33.0-33.9, adult: Secondary | ICD-10-CM

## 2017-10-06 DIAGNOSIS — E6609 Other obesity due to excess calories: Secondary | ICD-10-CM

## 2017-10-06 DIAGNOSIS — R748 Abnormal levels of other serum enzymes: Secondary | ICD-10-CM | POA: Diagnosis not present

## 2017-10-06 DIAGNOSIS — I10 Essential (primary) hypertension: Secondary | ICD-10-CM

## 2017-10-06 LAB — LIPID PANEL
CHOLESTEROL: 166 mg/dL (ref <200)
HDL: 37 mg/dL — ABNORMAL LOW (ref >50)
LDL CHOLESTEROL (CALC): 103 mg/dL — AB
Non-HDL Cholesterol (Calc): 129 mg/dL (calc) (ref <130)
Total CHOL/HDL Ratio: 4.5 (calc) (ref <5.0)
Triglycerides: 154 mg/dL — ABNORMAL HIGH (ref <150)

## 2017-10-06 LAB — HEMOGLOBIN A1C
EAG (MMOL/L): 6.8 (calc)
Hgb A1c MFr Bld: 5.9 % of total Hgb — ABNORMAL HIGH (ref <5.7)
MEAN PLASMA GLUCOSE: 123 (calc)

## 2017-10-06 LAB — COMPREHENSIVE METABOLIC PANEL
AG RATIO: 1.8 (calc) (ref 1.0–2.5)
ALBUMIN MSPROF: 4.6 g/dL (ref 3.6–5.1)
ALKALINE PHOSPHATASE (APISO): 62 U/L (ref 33–130)
ALT: 51 U/L — ABNORMAL HIGH (ref 6–29)
AST: 45 U/L — ABNORMAL HIGH (ref 10–35)
BILIRUBIN TOTAL: 0.8 mg/dL (ref 0.2–1.2)
BUN: 16 mg/dL (ref 7–25)
CALCIUM: 9.9 mg/dL (ref 8.6–10.4)
CO2: 25 mmol/L (ref 20–32)
Chloride: 107 mmol/L (ref 98–110)
Creat: 0.83 mg/dL (ref 0.60–0.93)
Globulin: 2.5 g/dL (calc) (ref 1.9–3.7)
Glucose, Bld: 120 mg/dL — ABNORMAL HIGH (ref 65–99)
Potassium: 4.7 mmol/L (ref 3.5–5.3)
Sodium: 141 mmol/L (ref 135–146)
Total Protein: 7.1 g/dL (ref 6.1–8.1)

## 2017-10-06 MED ORDER — GLUCOSE BLOOD VI STRP: ORAL_STRIP | 6 refills | Status: DC

## 2017-10-06 MED ORDER — METOPROLOL SUCCINATE ER 50 MG PO TB24: ORAL_TABLET | ORAL | 2 refills | Status: DC

## 2017-10-06 MED ORDER — LOSARTAN POTASSIUM 100 MG PO TABS: 100.0000 mg | ORAL_TABLET | Freq: Every day | ORAL | 2 refills | Status: DC

## 2017-10-06 NOTE — Assessment & Plan Note (Signed)
In setting of elevated LFT, hold statin, recheck, lipids as well

## 2017-10-06 NOTE — Assessment & Plan Note (Addendum)
Bp looks okay for age, no known heart disease or stroke She is not diabetic but borderline No change to meds Continues to check at home  Discussed some type of exercise or water aerobics

## 2017-10-06 NOTE — Progress Notes (Signed)
   Subjective:    Patient ID: Cathy [name], female    DOB: [birth year], 72 y.o.   MRN: [id number]  Patient presents for Follow-up (liver labs)  Patient here to follow-up abnormal labs.  She was seen by her oncologist 2 weeks ago.  She had mildly elevated calcium as well as elevated LFTs at 53 and 64.  Her LDH was normal. To have lipid panel back in October 2018 had mildly elevated triglycerides 154 LDL was at goal.  He is currently on pravastatin 20 mg and was on calcium hair skin nails/and vitamin D however she did hold both Vitamin D and pravastatin after receiving the abnormal labs.  She is here for recheck.  That she also had mildly elevated potassium at 5.2.    Hypertension she is taking losartan and Toprol as prescribed. Medications reviewed  Asthma- has not used Breo or albuterol    Review Of Systems:  GEN- denies fatigue, fever, weight loss,weakness, recent illness HEENT- denies eye drainage, change in vision, nasal discharge, CVS- denies chest pain, palpitations RESP- denies SOB, cough, wheeze ABD- denies N/V, change in stools, abd pain GU- denies dysuria, hematuria, dribbling, incontinence MSK- +joint pain, muscle aches, injury Neuro- denies headache, dizziness, syncope, seizure activity       Objective:    BP (!) 142/78   Pulse 78   Temp 97.9 F (36.6 C) (Oral)   Resp 14   Ht 5\' 2"  (1.575 m)   Wt 178 lb (80.7 kg)   SpO2 98%   BMI 32.56 kg/m  GEN- NAD, alert and oriented x3 HEENT- PERRL, EOMI, non injected sclera, pink conjunctiva, MMM, oropharynx clear Neck- Supple, no thryomegaly, left supraclavicular node palpated  CVS- RRR, no murmur RESP-CTAB ABD-NABS,soft,NT,ND EXT- No edema Pulses- Radial, DP- 2+      Assessment & Plan:      Problem List Items Addressed This Visit      Unprioritized   Obesity   Elevated liver enzymes   Relevant Orders   Comprehensive metabolic panel   Lipid panel   Pre-diabetes    Recheck A1C discussed dietary changes        Relevant Orders   Hemoglobin A1c   Hyperlipidemia    In setting of elevated LFT, hold statin, recheck, lipids as well       Relevant Medications   metoprolol succinate (TOPROL-XL) 50 MG 24 hr tablet   losartan (COZAAR) 100 MG tablet   Other Relevant Orders   Lipid panel   Hypercalcemia - Primary    Stop all vitamins with calcium Check PTH      Relevant Orders   PTH, Intact and Calcium   Essential hypertension    Bp looks okay for age, no known heart disease or stroke She is not diabetic but borderline No change to meds Continues to check at home  Discussed some type of exercise or water aerobics      Relevant Medications   metoprolol succinate (TOPROL-XL) 50 MG 24 hr tablet   losartan (COZAAR) 100 MG tablet   Asthma    Doing well no flares, no meds required On allergy meds         Note: This dictation was prepared with Dragon dictation along with smaller phrase technology. Any transcriptional errors that result from this process are unintentional.

## 2017-10-06 NOTE — Assessment & Plan Note (Signed)
Stop all vitamins with calcium Check PTH

## 2017-10-06 NOTE — Patient Instructions (Addendum)
F/U Physical in December  We will call with lab results

## 2017-10-06 NOTE — Assessment & Plan Note (Signed)
Doing well no flares, no meds required On allergy meds

## 2017-10-06 NOTE — Assessment & Plan Note (Signed)
Recheck A1C discussed dietary changes

## 2017-10-09 LAB — PTH, INTACT AND CALCIUM
Calcium: 10 mg/dL (ref 8.6–10.4)
PTH: 22 pg/mL (ref 14–64)

## 2017-10-16 ENCOUNTER — Ambulatory Visit: Payer: Medicare Other | Admitting: Family Medicine

## 2017-12-05 ENCOUNTER — Other Ambulatory Visit: Payer: Medicare Other

## 2017-12-05 ENCOUNTER — Other Ambulatory Visit: Payer: Self-pay | Admitting: Family Medicine

## 2017-12-05 DIAGNOSIS — R748 Abnormal levels of other serum enzymes: Secondary | ICD-10-CM

## 2017-12-05 LAB — COMPLETE METABOLIC PANEL WITH GFR
AG RATIO: 1.7 (calc) (ref 1.0–2.5)
ALBUMIN MSPROF: 4.5 g/dL (ref 3.6–5.1)
ALT: 44 U/L — AB (ref 6–29)
AST: 42 U/L — ABNORMAL HIGH (ref 10–35)
Alkaline phosphatase (APISO): 54 U/L (ref 33–130)
BUN: 12 mg/dL (ref 7–25)
CALCIUM: 10.3 mg/dL (ref 8.6–10.4)
CO2: 30 mmol/L (ref 20–32)
Chloride: 105 mmol/L (ref 98–110)
Creat: 0.93 mg/dL (ref 0.60–0.93)
GFR, EST AFRICAN AMERICAN: 72 mL/min/{1.73_m2} (ref >=60)
GFR, EST NON AFRICAN AMERICAN: 62 mL/min/{1.73_m2} (ref >=60)
GLOBULIN: 2.7 g/dL (ref 1.9–3.7)
Glucose, Bld: 128 mg/dL — ABNORMAL HIGH (ref 65–99)
POTASSIUM: 4.8 mmol/L (ref 3.5–5.3)
SODIUM: 144 mmol/L (ref 135–146)
Total Bilirubin: 0.5 mg/dL (ref 0.2–1.2)
Total Protein: 7.2 g/dL (ref 6.1–8.1)

## 2017-12-07 ENCOUNTER — Ambulatory Visit (INDEPENDENT_AMBULATORY_CARE_PROVIDER_SITE_OTHER): Payer: Medicare Other | Admitting: *Deleted

## 2017-12-07 ENCOUNTER — Other Ambulatory Visit: Payer: Self-pay

## 2017-12-07 ENCOUNTER — Other Ambulatory Visit: Payer: Self-pay | Admitting: *Deleted

## 2017-12-07 DIAGNOSIS — Z23 Encounter for immunization: Secondary | ICD-10-CM

## 2017-12-07 DIAGNOSIS — E7800 Pure hypercholesterolemia, unspecified: Secondary | ICD-10-CM

## 2017-12-07 DIAGNOSIS — R748 Abnormal levels of other serum enzymes: Secondary | ICD-10-CM

## 2017-12-18 ENCOUNTER — Other Ambulatory Visit: Payer: Medicare Other

## 2017-12-25 ENCOUNTER — Ambulatory Visit
Admission: RE | Admit: 2017-12-25 | Discharge: 2017-12-25 | Disposition: A | Discharge status: Home / Self Care | Payer: Medicare Other | Source: Ambulatory Visit | Attending: Family Medicine | Admitting: Family Medicine

## 2017-12-25 DIAGNOSIS — E7800 Pure hypercholesterolemia, unspecified: Secondary | ICD-10-CM

## 2017-12-25 DIAGNOSIS — R748 Abnormal levels of other serum enzymes: Secondary | ICD-10-CM

## 2017-12-26 ENCOUNTER — Other Ambulatory Visit: Payer: Self-pay | Admitting: *Deleted

## 2017-12-26 DIAGNOSIS — E785 Hyperlipidemia, unspecified: Secondary | ICD-10-CM

## 2017-12-26 DIAGNOSIS — R748 Abnormal levels of other serum enzymes: Secondary | ICD-10-CM

## 2017-12-26 MED ORDER — ATORVASTATIN CALCIUM 20 MG PO TABS: 20.0000 mg | ORAL_TABLET | Freq: Every day | ORAL | 3 refills | Status: DC

## 2018-02-24 ENCOUNTER — Other Ambulatory Visit: Payer: Self-pay | Admitting: Family Medicine

## 2018-02-26 ENCOUNTER — Ambulatory Visit (INDEPENDENT_AMBULATORY_CARE_PROVIDER_SITE_OTHER): Payer: Medicare Other | Admitting: *Deleted

## 2018-02-26 DIAGNOSIS — Z23 Encounter for immunization: Secondary | ICD-10-CM

## 2018-04-30 ENCOUNTER — Other Ambulatory Visit: Payer: Self-pay | Admitting: Family Medicine

## 2018-05-17 LAB — HM DIABETES EYE EXAM

## 2018-05-23 ENCOUNTER — Encounter: Payer: Self-pay | Admitting: *Deleted

## 2018-06-04 ENCOUNTER — Other Ambulatory Visit: Payer: Self-pay | Admitting: Family Medicine

## 2018-06-04 DIAGNOSIS — Z1231 Encounter for screening mammogram for malignant neoplasm of breast: Secondary | ICD-10-CM

## 2018-06-19 ENCOUNTER — Encounter: Payer: Self-pay | Admitting: Family Medicine

## 2018-06-19 ENCOUNTER — Ambulatory Visit: Payer: Medicare Other | Admitting: Family Medicine

## 2018-06-19 VITALS — BP 136/82 | HR 80 | Temp 97.9°F | Resp 14 | Ht 62.0 in | Wt 175.0 lb

## 2018-06-19 DIAGNOSIS — J209 Acute bronchitis, unspecified: Secondary | ICD-10-CM

## 2018-06-19 DIAGNOSIS — I10 Essential (primary) hypertension: Secondary | ICD-10-CM | POA: Diagnosis not present

## 2018-06-19 DIAGNOSIS — M8589 Other specified disorders of bone density and structure, multiple sites: Secondary | ICD-10-CM | POA: Diagnosis not present

## 2018-06-19 DIAGNOSIS — Z0000 Encounter for general adult medical examination without abnormal findings: Secondary | ICD-10-CM | POA: Diagnosis not present

## 2018-06-19 DIAGNOSIS — E7800 Pure hypercholesterolemia, unspecified: Secondary | ICD-10-CM

## 2018-06-19 DIAGNOSIS — Z1159 Encounter for screening for other viral diseases: Secondary | ICD-10-CM

## 2018-06-19 DIAGNOSIS — R7303 Prediabetes: Secondary | ICD-10-CM

## 2018-06-19 DIAGNOSIS — K760 Fatty (change of) liver, not elsewhere classified: Secondary | ICD-10-CM

## 2018-06-19 MED ORDER — ATORVASTATIN CALCIUM 20 MG PO TABS: 20.0000 mg | ORAL_TABLET | Freq: Every day | ORAL | 3 refills | Status: DC

## 2018-06-19 MED ORDER — GABAPENTIN 100 MG PO CAPS: ORAL_CAPSULE | ORAL | 1 refills | Status: DC

## 2018-06-19 MED ORDER — FLUTICASONE FUROATE-VILANTEROL 100-25 MCG/INH IN AEPB: 1.0000 | INHALATION_SPRAY | Freq: Every day | RESPIRATORY_TRACT | 3 refills | Status: DC

## 2018-06-19 MED ORDER — PREDNISONE 20 MG PO TABS: 40.0000 mg | ORAL_TABLET | Freq: Every day | ORAL | 0 refills | Status: DC

## 2018-06-19 NOTE — Patient Instructions (Addendum)
We will call with lab results Bone Density- please schedule Vitamin D 1000IU  Robitussin DM F/U 6 months

## 2018-06-19 NOTE — Progress Notes (Signed)
Subjective:   Patient presents for Medicare Annual/Subsequent preventive examination.   Pt here for wellness exam and to f/u chronic medical problems  Medications reviewed  Borderline  DM- last A1C5.9% in April   HTN- no concerns or SE with medication, no chest pain, SOB  Hyperlipidemia/Fatty Liver- on lipitor 20mg  at bedtime, due for recheck on LFT and lipids   Bronchitis - using albuterol past week or so, had chest cold, still feels congested, and has some wheezing, no fever, nasal congestion has   Neuropathy is getting worse in feet , feels like feet are thick. occ burning sensation, no sores , tingles in great toes  Hypercalcemia- resolved with stopping calcium, need to recheck level     Review Past Medical/Family/Social: per EMR   Risk Factors  Current exercise habits: none Dietary issues discussed:yes   Cardiac risk factors: Obesity (BMI >= 30 kg/m2). HTN, Hyperlipidemia   Depression Screen  (Note: if answer to either of the following is "Yes", a more complete depression screening is indicated)  Over the past two weeks, have you felt down, depressed or hopeless? No Over the past two weeks, have you felt little interest or pleasure in doing things? No Have you lost interest or pleasure in daily life? No Do you often feel hopeless? No Do you cry easily over simple problems? No   Activities of Daily Living  In your present state of health, do you have any difficulty performing the following activities?:  Driving? No  Managing money? No  Feeding yourself? No  Getting from bed to chair? No  Climbing a flight of stairs? No  Preparing food and eating?: No  Bathing or showering? No  Getting dressed: No  Getting to the toilet? No  Using the toilet:No  Moving around from place to place: No  In the past year have you fallen or had a near fall?:No  Are you sexually active? No  Do you have more than one partner? No   Hearing Difficulties: No  Do you often ask people to  speak up or repeat themselves? No  Do you experience ringing or noises in your ears? No Do you have difficulty understanding soft or whispered voices? No  Do you feel that you have a problem with memory? No Do you often misplace items? No  Do you feel safe at home? Yes  Cognitive Testing  Alert? Yes Normal Appearance?Yes  Oriented to person? Yes Place? Yes  Time? Yes  Recall of three objects? Yes  Can perform simple calculations? Yes  Displays appropriate judgment?Yes  Can read the correct time from a watch face?Yes   List the Names of Other Physician/Practitioners you currently use:    Dr. Herbert Deaner - planning for cataract surgery   Oncology    Orthopedics      Screening Tests / Date Colonoscopy     UTD                Zostavax / Shingrix- UTD Mammogram  UTD Influenza Vaccine UTD  Tetanus/tdap  UTD Hep C screening  Bone Density- Ostepenia - 2018   ROS: GEN- denies fatigue, fever, weight loss,weakness, recent illness HEENT- denies eye drainage, change in vision, nasal discharge, CVS- denies chest pain, palpitations RESP- denies SOB,+ cough, +wheeze ABD- denies N/V, change in stools, abd pain GU- denies dysuria, hematuria, dribbling, incontinence MSK- denies joint pain, muscle aches, injury Neuro- denies headache, dizziness, syncope, seizure activity  GEN- NAD, alert and oriented x3 HEENT- PERRL, EOMI, non injected sclera,  pink conjunctiva, MMM, oropharynx clear Neck- Supple, no thryomegaly, no LAD, left supraclavicular node palpated ( Chronic) CVS- RRR, no murmur RESP-few scattered wheeze, normal WOB, no rales or rhonchi ABD-NABS,soft,NT,ND EXT- No edema Neuro-CNII-XII in tact, decreased monofilament bilat great toes and 2nd and 3rd  Pulses- Radial, DP- 2+    Assessment:    Annual wellness medicare exam   Plan:    During the course of the visit the patient was educated and counseled about appropriate screening and preventive services including:    Prevention-  due for Bone Density/ Mammogram - pt to schedule  , Hep C screening      Audit C/Fall/Depression screening Negative    Fasting labs to be done    Fatty Liver/Hyperlipidemia- continue statin drug,re check lipids   Peripheral neuropathy- trial of gabapentin at bedtime start 100mg , can increase to 200mg  if needed   Acute bronchitis- prednisone x 5 days, albuterol, refilled Breo to use as maintanance inhaler, robitussin DM  Pre-diabetes- discussed exercise to help maintain weight and help with OA of joints   Pt has advanced directives     F/U 6 months           Diet review for nutrition referral? Yes ____ Not Indicated __x__  Patient Instructions (the written plan) was given to the patient.  Medicare Attestation  I have personally reviewed:  The patient's medical and social history  Their use of alcohol, tobacco or illicit drugs  Their current medications and supplements  The patient's functional ability including ADLs,fall risks, home safety risks, cognitive, and hearing and visual impairment  Diet and physical activities  Evidence for depression or mood disorders  The patient's weight, height, BMI, and visual acuity have been recorded in the chart. I have made referrals, counseling, and provided education to the patient based on review of the above and I have provided the patient with a written personalized care plan for preventive services.

## 2018-06-20 LAB — HEMOGLOBIN A1C
Hgb A1c MFr Bld: 6 % of total Hgb — ABNORMAL HIGH (ref <5.7)
Mean Plasma Glucose: 126 (calc)
eAG (mmol/L): 7 (calc)

## 2018-06-20 LAB — CBC WITH DIFFERENTIAL/PLATELET
ABSOLUTE MONOCYTES: 981 {cells}/uL — AB (ref 200–950)
BASOS ABS: 63 {cells}/uL (ref 0–200)
BASOS PCT: 0.7 %
EOS ABS: 360 {cells}/uL (ref 15–500)
Eosinophils Relative: 4 %
HEMATOCRIT: 45.7 % — AB (ref 35.0–45.0)
HEMOGLOBIN: 15.5 g/dL (ref 11.7–15.5)
LYMPHS ABS: 1611 {cells}/uL (ref 850–3900)
MCH: 30.4 pg (ref 27.0–33.0)
MCHC: 33.9 g/dL (ref 32.0–36.0)
MCV: 89.6 fL (ref 80.0–100.0)
MONOS PCT: 10.9 %
MPV: 10.3 fL (ref 7.5–12.5)
NEUTROS ABS: 5985 {cells}/uL (ref 1500–7800)
Neutrophils Relative %: 66.5 %
Platelets: 296 10*3/uL (ref 140–400)
RBC: 5.1 10*6/uL (ref 3.80–5.10)
RDW: 12.4 % (ref 11.0–15.0)
Total Lymphocyte: 17.9 %
WBC: 9 10*3/uL (ref 3.8–10.8)

## 2018-06-20 LAB — LIPID PANEL
CHOL/HDL RATIO: 3.5 (calc) (ref <5.0)
CHOLESTEROL: 122 mg/dL (ref <200)
HDL: 35 mg/dL — ABNORMAL LOW (ref >50)
LDL Cholesterol (Calc): 62 mg/dL (calc)
Non-HDL Cholesterol (Calc): 87 mg/dL (calc) (ref <130)
Triglycerides: 168 mg/dL — ABNORMAL HIGH (ref <150)

## 2018-06-20 LAB — COMPREHENSIVE METABOLIC PANEL
AG Ratio: 1.8 (calc) (ref 1.0–2.5)
ALT: 29 U/L (ref 6–29)
AST: 31 U/L (ref 10–35)
Albumin: 4.7 g/dL (ref 3.6–5.1)
Alkaline phosphatase (APISO): 62 U/L (ref 33–130)
BUN: 13 mg/dL (ref 7–25)
CO2: 26 mmol/L (ref 20–32)
CREATININE: 0.91 mg/dL (ref 0.60–0.93)
Calcium: 10.3 mg/dL (ref 8.6–10.4)
Chloride: 102 mmol/L (ref 98–110)
Globulin: 2.6 g/dL (calc) (ref 1.9–3.7)
Glucose, Bld: 120 mg/dL — ABNORMAL HIGH (ref 65–99)
POTASSIUM: 5.2 mmol/L (ref 3.5–5.3)
Sodium: 140 mmol/L (ref 135–146)
Total Bilirubin: 0.8 mg/dL (ref 0.2–1.2)
Total Protein: 7.3 g/dL (ref 6.1–8.1)

## 2018-06-20 LAB — HEPATITIS C ANTIBODY
Hepatitis C Ab: NONREACTIVE
SIGNAL TO CUT-OFF: 0.03 (ref <1.00)

## 2018-06-21 ENCOUNTER — Encounter: Payer: Self-pay | Admitting: *Deleted

## 2018-07-16 ENCOUNTER — Ambulatory Visit: Payer: Medicare Other

## 2018-07-30 ENCOUNTER — Ambulatory Visit
Admission: RE | Admit: 2018-07-30 | Discharge: 2018-07-30 | Disposition: A | Discharge status: Home / Self Care | Payer: Medicare Other | Source: Ambulatory Visit | Attending: Family Medicine | Admitting: Family Medicine

## 2018-07-30 DIAGNOSIS — M8589 Other specified disorders of bone density and structure, multiple sites: Secondary | ICD-10-CM

## 2018-07-30 DIAGNOSIS — Z1231 Encounter for screening mammogram for malignant neoplasm of breast: Secondary | ICD-10-CM

## 2018-08-01 NOTE — Progress Notes (Signed)
Cardiology Office Note   Date:  08/06/2018   ID:  Cathy [name], Cathy [name] 1945/07/01, MRN [id number]  PCP:  Alycia Rossetti, MD  Cardiologist:   Jenkins Rouge, MD   No chief complaint on file.     History of Present Illness:  73 y.o. first seen 08/09/16 for chest pain. Atypical with GI overtones GERD CRF;s HTN DM-2  Previous echo 11/03/14 EF 55-60% Had f/u myovue 08/16/16 normal no ischemia EF 60%   Has had adenopathy in neck chest and upper abdomen with biopsy showing atypical lymphoid hyperplasia.  Sees oncology [name] for this.  Asthma has been worse using inhaler given Breo by Dr Lonell Grandchild 06/12/17   She is sedentary and eats too much Son MD in [place] has a 23 yo and 92 yo grand-children    Past Medical History:  Diagnosis Date  . Abnormal glucose   . Allergy   . Asthma    Allergy induced  . Benign breast cyst in female   . GERD (gastroesophageal reflux disease)   . Hilar lymphadenopathy 08/01/2011  . History of nuclear stress test 2009   Treadmill and Stress Myoview- no CAD  . Hyperlipidemia   . Hypertension   . Lymphadenopathy of left cervical region 08/01/2011  . Nephrolithiasis 1984  . Osteopenia   . PONV (postoperative nausea and vomiting)   . Post-menopause   . Pre-diabetes    borderline  . 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 Vision changes     Past Surgical History:  Procedure Laterality Date  . CHOLECYSTECTOMY    . LITHOTRIPSY  1984  . LYMPH NODE BIOPSY    . 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 albuterol (PROVENTIL HFA;VENTOLIN HFA) 108 (90 Base) MCG/ACT inhaler Inhale 2 puffs into the lungs every 4 (four) hours as needed for wheezing or shortness of breath (with spacer). 1 Inhaler 3  . aspirin EC 81 MG tablet Take 81 mg by mouth  daily.    Marland Kitchen atorvastatin (LIPITOR) 20 MG tablet Take 1 tablet (20 mg total) by mouth daily. 90 tablet 3  . cetirizine (ZYRTEC) 10 MG tablet Take 1 tablet (10 mg total) by mouth daily. 90 tablet 2  . CINNAMON PO Take 500 mg by mouth daily.    Marland Kitchen co-enzyme Q-10 30 MG capsule Take 30 mg by mouth 3 (three) times daily.    Marland Kitchen denosumab (PROLIA) 60 MG/ML SOSY injection Inject 60 mg into the skin every 6 (six) months.    . fluticasone furoate-vilanterol (BREO ELLIPTA) 100-25 MCG/INH AEPB Inhale 1 puff into the lungs daily. 30 each 3  . gabapentin (NEURONTIN) 100 MG capsule Take 1-2 capsules at bedtime 60 capsule 1  . Glucosamine-Chondroitin (GLUCOSAMINE CHONDR COMPLEX PO) Take 400 mg by mouth daily.    Marland Kitchen glucose blood (ACCU-CHEK AVIVA PLUS) test strip USE TO MONITOR BLOOD SUGAR THREE TIMES AWEEK 50 each 6  . losartan (COZAAR) 100 MG tablet Take 1 tablet (100 mg total) by mouth daily. 90 tablet 2  . metoprolol succinate (TOPROL-XL) 50 MG 24 hr tablet TAKE 1 TABLET BY MOUTH ONCE A DAY WITH OR IMMEDIATELY FOLLOWING A MEAL 90 tablet 2  . Multiple Vitamin (MULTIVITAMIN) capsule Take 1 capsule by mouth daily.    . Multiple Vitamins-Minerals (  HAIR SKIN AND NAILS FORMULA) TABS Take by mouth 3 (three) times daily.    . Omega-3 Fatty Acids (FISH OIL PO) Take 1,000 mg by mouth daily.    . predniSONE (DELTASONE) 20 MG tablet Take 2 tablets (40 mg total) by mouth daily with breakfast. 10 tablet 0  . ranitidine (ZANTAC) 150 MG tablet TAKE 1 TABLET BY MOUTH ONCE A DAY 90 tablet 3  . TURMERIC PO Take 1 tablet by mouth daily.    . vitamin E 100 UNIT capsule Take by mouth daily.     No current facility-administered medications for this visit.     Allergies:   Codeine    Social History:  The patient  reports that she has never smoked. She has never used smokeless tobacco. She reports that she does not drink alcohol or use drugs.   Family History:  The patient's family history includes Alcohol abuse in her brother;  Asthma in her sister; Breast cancer in her cousin; Cancer in her father, maternal aunt, and maternal aunt; Dementia in her mother; Diabetes in her brother; Heart attack in her mother; Hypertension in her mother; Stroke in her mother.    ROS:  Please see the history of present illness.   Otherwise, review of systems are positive for none.   All other systems are reviewed and negative.    PHYSICAL EXAM: VS:  BP (!) 154/82   Pulse 84   Ht 5\' 2"  (1.575 m)   Wt 82.5 kg   SpO2 97%   BMI 33.25 kg/m  , BMI Body mass index is 33.25 kg/m. Affect appropriate Healthy:  appears stated age 23: mild left adenopathy  Neck supple with cervical lymphadenopathy  JVP normal no bruits no thyromegaly Lungs clear with no wheezing and good diaphragmatic motion Heart:  S1/S2 no murmur, no rub, gallop or click PMI normal Abdomen: benighn, BS positve, no tenderness, no AAA no bruit.  No HSM or HJR Distal pulses intact with no bruits Left LLE  Edema post left TKR  Neuro non-focal Skin warm and dry No muscular weakness     EKG:  06/03/16 SR rate 64 LVH poor R wave progression 08/07/17  SR rate 78 LAD LVH  08/06/18 SR rate 84 LAFB voltage LVH   Recent Labs: 06/19/2018: ALT 29; BUN 13; Creat 0.91; Hemoglobin 15.5; Platelets 296; Potassium 5.2; Sodium 140    Lipid Panel    Component Value Date/Time   CHOL 122 06/19/2018 0848   TRIG 168 (H) 06/19/2018 0848   HDL 35 (L) 06/19/2018 0848   CHOLHDL 3.5 06/19/2018 0848   VLDL 34 (H) 12/06/2016 0932   LDLCALC 62 06/19/2018 0848      Wt Readings from Last 3 Encounters:  08/06/18 82.5 kg  06/19/18 79.4 kg  10/06/17 80.7 kg      Other studies Reviewed: Additional studies/ records that were reviewed today include: notes Dr Buelah Manis labs and ECG Echo 2016.    ASSESSMENT AND PLAN:  1.  Chest Pain atypical normal myovue 08/16/16 observe  2. GERD: continue H2 blocker low carb diet avoid excess NSAI's 3. DM  Discussed low carb diet.  Target  hemoglobin A1c is 6.5 or less.  Continue current medications. 4. Lymphadenopathy f/u [name] needs f/u CT scanning   5. HTN:  Elevated with LE edema on left start HCTZ 12.5 mg daily  F/u bMET in 4 weeks with son  6. Lipids:  On statin LDL at goal with no documented vascular disease  F/u in a year  Jenkins Rouge

## 2018-08-03 ENCOUNTER — Ambulatory Visit (INDEPENDENT_AMBULATORY_CARE_PROVIDER_SITE_OTHER): Payer: Medicare Other | Admitting: *Deleted

## 2018-08-03 DIAGNOSIS — M8589 Other specified disorders of bone density and structure, multiple sites: Secondary | ICD-10-CM

## 2018-08-03 MED ORDER — DENOSUMAB 60 MG/ML ~~LOC~~ SOSY
60.0000 mg | PREFILLED_SYRINGE | Freq: Once | SUBCUTANEOUS | Status: AC
  Administered 2018-08-03: 60 mg via SUBCUTANEOUS

## 2018-08-06 ENCOUNTER — Encounter: Payer: Self-pay | Admitting: Cardiovascular Disease

## 2018-08-06 ENCOUNTER — Telehealth: Payer: Self-pay | Admitting: *Deleted

## 2018-08-06 ENCOUNTER — Other Ambulatory Visit: Payer: Self-pay | Admitting: *Deleted

## 2018-08-06 ENCOUNTER — Ambulatory Visit: Payer: Medicare Other | Admitting: Cardiovascular Disease

## 2018-08-06 VITALS — BP 154/82 | HR 84 | Ht 62.0 in | Wt 181.8 lb

## 2018-08-06 DIAGNOSIS — I10 Essential (primary) hypertension: Secondary | ICD-10-CM

## 2018-08-06 DIAGNOSIS — R079 Chest pain, unspecified: Secondary | ICD-10-CM

## 2018-08-06 MED ORDER — HYDROCHLOROTHIAZIDE 12.5 MG PO CAPS: 12.5000 mg | ORAL_CAPSULE | Freq: Every day | ORAL | 3 refills | Status: DC

## 2018-08-06 NOTE — Patient Instructions (Signed)
Medication Instructions:  Your physician has recommended you make the following change in your medication: 1-START Hydrochlorothiazide 12.5 mg by mouth daily  If you need a refill on your cardiac medications before your next appointment, please call your pharmacy.   Lab work: Your physician recommends that you return for lab work in: 4 weeks for BMET.  If you have labs (blood work) drawn today and your tests are completely normal, you will receive your results only by: Marland Kitchen MyChart Message (if you have MyChart) OR . A paper copy in the mail If you have any lab test that is abnormal or we need to change your treatment, we will call you to review the results.  Testing/Procedures: None ordered today.  Follow-Up: At [facility], you and your health needs are our priority.  As part of our continuing mission to provide you with exceptional heart care, we have created designated Provider Care Teams.  These Care Teams include your primary Cardiologist (physician) and Advanced Practice Providers (APPs -  Physician Assistants and Nurse Practitioners) who all work together to provide you with the care you need, when you need it. You will need a follow up appointment in 1 years.  Please call our office 2 months in advance to schedule this appointment.  You may see Jenkins Rouge, MD or one of the following Advanced Practice Providers on your designated Care Team:   Truitt Merle, NP Cecilie Kicks, NP . Kathyrn Drown, NP

## 2018-08-06 NOTE — Telephone Encounter (Signed)
Received call from patient.   Reports that she was seen at cards today and noted elevated BP (154/ 82) and slight edema to BLE. Patient states that she had taken Losartan and Metoprolol that AM prior to appointment. Dr. Johnsie Cancel recommended starting HCTZ for BP and edema. Patient was advised to take medication later in day as she takes (2) BP meds in AM.   Advised to take HCTZ in AM as it is a fluid pill and will cause her to urinate more frequently. Advised that taking medication later in day will increase fall risk at night due to frequent BR trips.   Inquired as to if she should move either Metoprolol or Losartan to later in day or if it is appropriate to take all (3) medications together. Advised that she can take all medications in AM, but to monitor BP and be advised of Hypotensive Sx: dizziness or lightheadedness, syncope, blurred vision, nausea, fatigue, or lack of concentration. Advised if any Sx noted to contact office for further information.   Also states that Dr. Johnsie Cancel requested BMET to monitor kidney function in (4) weeks. Inquired as to if she can have labs drawn here. Advised that labs can be drawn here and faxed to Dr. Johnsie Cancel.   MD to be made aware.

## 2018-08-06 NOTE — Telephone Encounter (Signed)
Noted Agree with above, okay to have labs drawn Okay to take all meds in the morning

## 2018-08-10 ENCOUNTER — Ambulatory Visit: Payer: Medicare Other | Admitting: Family Medicine

## 2018-08-10 ENCOUNTER — Encounter: Payer: Self-pay | Admitting: Family Medicine

## 2018-08-10 ENCOUNTER — Telehealth: Payer: Self-pay | Admitting: Family Medicine

## 2018-08-10 ENCOUNTER — Other Ambulatory Visit: Payer: Self-pay

## 2018-08-10 VITALS — BP 128/74 | HR 90 | Temp 98.3°F | Resp 16 | Ht 62.0 in | Wt 180.0 lb

## 2018-08-10 DIAGNOSIS — D229 Melanocytic nevi, unspecified: Secondary | ICD-10-CM | POA: Diagnosis not present

## 2018-08-10 DIAGNOSIS — L989 Disorder of the skin and subcutaneous tissue, unspecified: Secondary | ICD-10-CM | POA: Diagnosis not present

## 2018-08-10 DIAGNOSIS — L821 Other seborrheic keratosis: Secondary | ICD-10-CM | POA: Diagnosis not present

## 2018-08-10 NOTE — Progress Notes (Signed)
   Subjective:    Patient ID: Cathy [name], female    DOB: [birth year], 73 y.o.   MRN: [id number]  Patient presents for Irritation (irregular ptchy of skin in hairlne- has scab like texture to top- no pain or ithing- no known injury )  Patient here with scab-like lesion in the right frontal area of her scalp.  States that she does scratch and pick at it.  Noticed about 2 weeks ago.  States that she scratches at some other spots in her scalp but is unable to see these.  Her hairstylist was worried and asked her to have it evaluated.  She also has multiple moles all over her skin and sun spots including cherry angiomas.  She has no known history of any skin cancer has not seen a dermatologist.  At her recent cardiology note hydrochlorothiazide was added secondary to mild peripheral edema and elevated blood pressure she is due to have a repeat metabolic panel in 4 weeks she not start the medication until this past Wednesday.  No other concerns.   Review Of Systems:  GEN- denies fatigue, fever, weight loss,weakness, recent illness HEENT- denies eye drainage, change in vision, nasal discharge, CVS- denies chest pain, palpitations RESP- denies SOB, cough, wheeze ABD- denies N/V, change in stools, abd pain GU- denies dysuria, hematuria, dribbling, incontinence MSK- denies joint pain, muscle aches, injury Neuro- denies headache, dizziness, syncope, seizure activity       Objective:    BP 128/74   Pulse 90   Temp 98.3 F (36.8 C) (Oral)   Resp 16   Ht 5\' 2"  (1.575 m)   Wt 180 lb (81.6 kg)   SpO2 97%   BMI 32.92 kg/m  GEN- NAD, alert and oriented x3 HEENT- PERRL, EOMI, non injected sclera, pink conjunctiva, MMM, oropharynx clear Neck- Supple, no thyromegaly CVS- RRR, no murmur RESP-CTAB Skin- multiple seb keratosis, cherry angiomas, sun spots on trunk, multiple moles in scalp, few seb keratosis with scabs, right frontal lesion- mole skin toned with scab, no active bleeding EXT- No  edema Pulses- Radia  2+        Assessment & Plan:      Problem List Items Addressed This Visit    None    Visit Diagnoses    Scalp lesion    -  Primary   Appears to be a nevus but also has Seb keratosis apperance has multiple in scalp and on bodys, send to Dermatology for evaluation of her skin in general, may freeze ones on scalp, none that I can see suspicious for melanoma but defer to dermatology    Numerous moles       Seborrheic keratoses           Note: This dictation was prepared with Dragon dictation along with smaller phrase technology. Any transcriptional errors that result from this process are unintentional.

## 2018-08-10 NOTE — Patient Instructions (Addendum)
Referral to dermatology F/U 4 weeks for blood draw

## 2018-08-10 NOTE — Telephone Encounter (Signed)
Pt states Dr. Buelah Manis will be referring pt to dermatology. Pt wants a Monday or Tuesday appt. With dermatology

## 2018-08-12 ENCOUNTER — Encounter: Payer: Self-pay | Admitting: Family Medicine

## 2018-08-22 ENCOUNTER — Other Ambulatory Visit: Payer: Self-pay | Admitting: Family Medicine

## 2018-09-05 ENCOUNTER — Telehealth: Payer: Self-pay | Admitting: Hematology and Oncology

## 2018-09-05 NOTE — Telephone Encounter (Signed)
Patient called to reschedule  °

## 2018-09-07 ENCOUNTER — Other Ambulatory Visit: Payer: Medicare Other

## 2018-09-10 ENCOUNTER — Other Ambulatory Visit: Payer: Self-pay | Admitting: Family Medicine

## 2018-09-17 ENCOUNTER — Other Ambulatory Visit: Payer: Medicare Other

## 2018-09-17 ENCOUNTER — Ambulatory Visit: Payer: Medicare Other | Admitting: Hematology and Oncology

## 2018-09-27 ENCOUNTER — Telehealth: Payer: Self-pay | Admitting: *Deleted

## 2018-09-27 NOTE — Telephone Encounter (Signed)
Received call from patient.   Requested alternative to Ranitidine D/T recall.   MD please advise.

## 2018-09-27 NOTE — Telephone Encounter (Signed)
I dont see this on her list, but if taking change to pepcid 20mg  at bedtime prn

## 2018-09-28 MED ORDER — OMEPRAZOLE 20 MG PO CPDR: 20.0000 mg | DELAYED_RELEASE_CAPSULE | Freq: Every day | ORAL | 3 refills | Status: DC | PRN

## 2018-09-28 MED ORDER — FAMOTIDINE 20 MG PO TABS: 20.0000 mg | ORAL_TABLET | Freq: Every evening | ORAL | 1 refills | Status: DC | PRN

## 2018-09-28 NOTE — Addendum Note (Signed)
Addended by: Sheral Flow on: 09/28/2018 03:08 PM   Modules accepted: Orders

## 2018-09-28 NOTE — Telephone Encounter (Signed)
Prescription sent to pharmacy.   Patient son to advise if no heart burn noted, stop medication.   If reflux continues, start Omeprazole.

## 2018-09-28 NOTE — Telephone Encounter (Signed)
Call placed to patient and patient made aware.   Reports that she is taking medication QD.   Agreeable to plan.   Prescription sent to pharmacy.

## 2018-09-28 NOTE — Telephone Encounter (Signed)
Received VM from pharmacy.   Was advised that Famotidine is on back order.   MD please advise.

## 2018-09-28 NOTE — Telephone Encounter (Signed)
Omeprazole 20 mg daily/prn  ° °

## 2018-10-09 ENCOUNTER — Telehealth: Payer: Self-pay | Admitting: Hematology and Oncology

## 2018-10-09 NOTE — Telephone Encounter (Signed)
Returned patient's phone call regarding rescheduling an appointment, per patient's request appointment has been moved from May to July.  Message to provider.

## 2018-10-22 ENCOUNTER — Ambulatory Visit: Payer: Medicare Other | Admitting: Hematology and Oncology

## 2018-10-22 ENCOUNTER — Other Ambulatory Visit: Payer: Medicare Other

## 2018-12-03 ENCOUNTER — Ambulatory Visit: Payer: Medicare Other | Admitting: Family Medicine

## 2018-12-03 ENCOUNTER — Encounter: Payer: Self-pay | Admitting: Family Medicine

## 2018-12-03 ENCOUNTER — Other Ambulatory Visit: Payer: Self-pay

## 2018-12-03 VITALS — BP 128/68 | HR 80 | Temp 98.4°F | Resp 14 | Ht 62.0 in | Wt 178.0 lb

## 2018-12-03 DIAGNOSIS — K219 Gastro-esophageal reflux disease without esophagitis: Secondary | ICD-10-CM | POA: Diagnosis not present

## 2018-12-03 DIAGNOSIS — I10 Essential (primary) hypertension: Secondary | ICD-10-CM | POA: Diagnosis not present

## 2018-12-03 DIAGNOSIS — J4540 Moderate persistent asthma, uncomplicated: Secondary | ICD-10-CM

## 2018-12-03 DIAGNOSIS — R590 Localized enlarged lymph nodes: Secondary | ICD-10-CM

## 2018-12-03 DIAGNOSIS — E7800 Pure hypercholesterolemia, unspecified: Secondary | ICD-10-CM | POA: Diagnosis not present

## 2018-12-03 DIAGNOSIS — R7303 Prediabetes: Secondary | ICD-10-CM

## 2018-12-03 NOTE — Assessment & Plan Note (Signed)
Unchanged, f/u oncology in July, no significant weight loss that is unintentional No B symptoms No enlargement on exam

## 2018-12-03 NOTE — Assessment & Plan Note (Signed)
Well controlled no changes 

## 2018-12-03 NOTE — Patient Instructions (Addendum)
F/U 4 months  We will call with lab results  

## 2018-12-03 NOTE — Assessment & Plan Note (Signed)
Controlled on breo

## 2018-12-03 NOTE — Assessment & Plan Note (Signed)
Continue lipitor  ?

## 2018-12-03 NOTE — Progress Notes (Signed)
   Subjective:    Patient ID: Cathy [name], female    DOB: [birth year], 73 y.o.   MRN: [id number]  Patient presents for Follow-up (is fasting- Dr. Johnsie Cancel requesting BMET) Patient here to follow-up chronic medical problems.  Medications reviewed. Still followed by oncology for her cervical region lymphadenopathy her appointment is in July for follow-up.  Borderline diabetes mellitus her last A1c in January was 6%.  Diet controlled Hyperlipidemia with mild elevated triglycerides.  Her last LDL had improved in January it was 62 triglycerides 168.  Toward 20 mg.  Hypertension she is taking her losartan and metoprolol as prescribed.  She did stop Zantac for the recall.takes TUMS as needed, has prilosec on hand as pepcidwas backordered   Asthma she has been maintained with her albuterol and Brio she also has her allergy medications as needed  appt for dermatology in August for her scalp lesions, noitching or pain, no real change  She has not been as active during COVID, gets right knee pain, but does not want to have surgery at this time   Review Of Systems:  GEN- denies fatigue, fever, weight loss,weakness, recent illness HEENT- denies eye drainage, change in vision, nasal discharge, CVS- denies chest pain, palpitations RESP- denies SOB, cough, wheeze ABD- denies N/V, change in stools, abd pain GU- denies dysuria, hematuria, dribbling, incontinence MSK- +joint pain, muscle aches, injury Neuro- denies headache, dizziness, syncope, seizure activity       Objective:    BP 128/68   Pulse 80   Temp 98.4 F (36.9 C) (Oral)   Resp 14   Ht 5\' 2"  (1.575 m)   Wt 178 lb (80.7 kg)   SpO2 97%   BMI 32.56 kg/m  GEN- NAD, alert and oriented x3 HEENT- PERRL, EOMI, non injected sclera, pink conjunctiva, MMM, oropharynx clear Neck- Supple, left cervical node palpated, NT CVS- RRR, no murmur RESP-CTAB ABD-NABS,soft,NT,ND EXT- No edema Pulses- Radial, DP- 2+        Assessment & Plan:       Problem List Items Addressed This Visit      Unprioritized   Asthma    Controlled on breo      Essential hypertension - Primary    Well controlled no changes       Relevant Orders   CBC with Differential/Platelet   Comprehensive metabolic panel   GERD (gastroesophageal reflux disease)    Prn prilosec or tums, she knows trigger foods, typically red sauce      Hyperlipidemia    Continue lipitor      Relevant Orders   Lipid panel   Lymphadenopathy of left cervical region    Unchanged, f/u oncology in July, no significant weight loss that is unintentional No B symptoms No enlargement on exam       Pre-diabetes   Relevant Orders   Hemoglobin A1c      Note: This dictation was prepared with Dragon dictation along with smaller phrase technology. Any transcriptional errors that result from this process are unintentional.

## 2018-12-03 NOTE — Assessment & Plan Note (Addendum)
Prn prilosec or tums, she knows trigger foods, typically red sauce

## 2018-12-04 LAB — COMPREHENSIVE METABOLIC PANEL
AG Ratio: 1.7 (calc) (ref 1.0–2.5)
ALT: 32 U/L — ABNORMAL HIGH (ref 6–29)
AST: 30 U/L (ref 10–35)
Albumin: 4.4 g/dL (ref 3.6–5.1)
Alkaline phosphatase (APISO): 51 U/L (ref 37–153)
BUN: 19 mg/dL (ref 7–25)
CO2: 25 mmol/L (ref 20–32)
Calcium: 10.4 mg/dL (ref 8.6–10.4)
Chloride: 102 mmol/L (ref 98–110)
Creat: 0.87 mg/dL (ref 0.60–0.93)
Globulin: 2.6 g/dL (calc) (ref 1.9–3.7)
Glucose, Bld: 137 mg/dL — ABNORMAL HIGH (ref 65–99)
Potassium: 4.2 mmol/L (ref 3.5–5.3)
Sodium: 139 mmol/L (ref 135–146)
Total Bilirubin: 0.7 mg/dL (ref 0.2–1.2)
Total Protein: 7 g/dL (ref 6.1–8.1)

## 2018-12-04 LAB — HEMOGLOBIN A1C
Hgb A1c MFr Bld: 6.3 % of total Hgb — ABNORMAL HIGH (ref <5.7)
Mean Plasma Glucose: 134 (calc)
eAG (mmol/L): 7.4 (calc)

## 2018-12-04 LAB — CBC WITH DIFFERENTIAL/PLATELET
Absolute Monocytes: 920 cells/uL (ref 200–950)
Basophils Absolute: 40 cells/uL (ref 0–200)
Basophils Relative: 0.5 %
Eosinophils Absolute: 272 cells/uL (ref 15–500)
Eosinophils Relative: 3.4 %
HCT: 42.7 % (ref 35.0–45.0)
Hemoglobin: 14.2 g/dL (ref 11.7–15.5)
Lymphs Abs: 1392 cells/uL (ref 850–3900)
MCH: 30.1 pg (ref 27.0–33.0)
MCHC: 33.3 g/dL (ref 32.0–36.0)
MCV: 90.7 fL (ref 80.0–100.0)
MPV: 9.9 fL (ref 7.5–12.5)
Monocytes Relative: 11.5 %
Neutro Abs: 5376 cells/uL (ref 1500–7800)
Neutrophils Relative %: 67.2 %
Platelets: 267 10*3/uL (ref 140–400)
RBC: 4.71 10*6/uL (ref 3.80–5.10)
RDW: 12.8 % (ref 11.0–15.0)
Total Lymphocyte: 17.4 %
WBC: 8 10*3/uL (ref 3.8–10.8)

## 2018-12-04 LAB — LIPID PANEL
Cholesterol: 124 mg/dL (ref <200)
HDL: 38 mg/dL — ABNORMAL LOW (ref >=50)
LDL Cholesterol (Calc): 61 mg/dL (calc)
Non-HDL Cholesterol (Calc): 86 mg/dL (calc) (ref <130)
Total CHOL/HDL Ratio: 3.3 (calc) (ref <5.0)
Triglycerides: 186 mg/dL — ABNORMAL HIGH (ref <150)

## 2018-12-10 ENCOUNTER — Other Ambulatory Visit: Payer: Self-pay | Admitting: Family Medicine

## 2018-12-17 ENCOUNTER — Ambulatory Visit: Payer: Medicare Other | Admitting: Family Medicine

## 2018-12-21 ENCOUNTER — Telehealth: Payer: Self-pay

## 2018-12-21 NOTE — Telephone Encounter (Signed)
Called and told her lab appt canceled for 7/13. Labs not needed because she had recent labs at PCP. She verbalized understanding.

## 2018-12-24 ENCOUNTER — Inpatient Hospital Stay: Payer: Medicare Other | Attending: Hematology and Oncology | Admitting: Hematology and Oncology

## 2018-12-24 ENCOUNTER — Other Ambulatory Visit: Payer: Self-pay

## 2018-12-24 ENCOUNTER — Inpatient Hospital Stay: Payer: Medicare Other

## 2018-12-24 ENCOUNTER — Encounter: Payer: Self-pay | Admitting: Hematology and Oncology

## 2018-12-24 DIAGNOSIS — Z79899 Other long term (current) drug therapy: Secondary | ICD-10-CM | POA: Diagnosis not present

## 2018-12-24 DIAGNOSIS — R739 Hyperglycemia, unspecified: Secondary | ICD-10-CM | POA: Diagnosis not present

## 2018-12-24 DIAGNOSIS — Z7982 Long term (current) use of aspirin: Secondary | ICD-10-CM | POA: Diagnosis not present

## 2018-12-24 DIAGNOSIS — R748 Abnormal levels of other serum enzymes: Secondary | ICD-10-CM | POA: Diagnosis not present

## 2018-12-24 DIAGNOSIS — R590 Localized enlarged lymph nodes: Secondary | ICD-10-CM | POA: Diagnosis present

## 2018-12-24 NOTE — Assessment & Plan Note (Signed)
She has mildly elevated liver enzymes and hyperglycemia, most consistent with possible metabolic syndrome/fatty liver disease She will continue medical management We discussed weight loss strategy and dietary modification

## 2018-12-24 NOTE — Progress Notes (Signed)
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 She is not symptomatic. She has no recurrence of disease elsewhere I recommend history, physical examination and blood work in 1 year and to defer imaging study unless there are signs and symptoms to suggest disease progression. She agreed.   Elevated liver enzymes She has mildly elevated liver enzymes and hyperglycemia, most consistent with possible metabolic syndrome/fatty liver disease She will continue medical management We discussed weight loss strategy and dietary modification   No orders of the defined types were placed in this encounter.   INTERVAL HISTORY: Please see below for problem oriented charting. She returns for further follow-up She feels well No new lymphadenopathy Denies recent infection, fever or chills Denies anorexia or abnormal weight loss  SUMMARY OF ONCOLOGIC HISTORY:  I reviewed the patient's records extensive and collaborated the history with the patient. 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 She had an incisional biopsy on October 25 which showed atypical lymphoid hyperplasia. Biopsies were sent for second opinion. This appeared to be a T-cell process with increased CD4 positive T lymphocytes but a firm diagnosis of lymphoma could not be established. CT scans of the neck chest abdomen and pelvis showed adenopathy in the left supraclavicular and left axillary regions and a single large right paratracheal lymph node 2 cm in  diameter. No abdominal or pelvic adenopathy and no splenomegaly.  She had no constitutional symptoms. Lab testing showed previous exposure to EBV and CMV viruses with elevated IgG but not IgM titers. There was also a significant elevation of IgG against toxoplasmosis. She had no history of exposure to cats or cat litter. She was not anemic. She had a normal white blood count and differential. Serum LDH normal. ESR 4 mm.  I elected to follow her with close observation alone and serial CT scans. She has remained asymptomatic.  REVIEW OF SYSTEMS:   Constitutional: Denies fevers, chills or abnormal weight loss Eyes: Denies blurriness of vision Ears, nose, mouth, throat, and face: Denies mucositis or sore throat Respiratory: Denies cough, dyspnea or wheezes Cardiovascular: Denies palpitation, chest discomfort or lower extremity swelling Gastrointestinal:  Denies nausea, heartburn or change in bowel habits Skin: Denies abnormal skin rashes Lymphatics: Denies new lymphadenopathy or easy bruising Neurological:Denies numbness, tingling or new weaknesses Behavioral/Psych: Mood is stable, no new changes  All other systems were reviewed with the patient and are negative.  I have reviewed the past medical history, past surgical history, social history and family history with the patient and they are unchanged from previous note.  ALLERGIES:  is allergic to codeine.  MEDICATIONS:  Current Outpatient Medications  Medication Sig Dispense Refill  . aspirin EC 81 MG tablet Take 81 mg by mouth daily.    Marland Kitchen atorvastatin (LIPITOR) 20 MG tablet Take 1 tablet (20 mg total) by mouth daily. 90 tablet 3  . cetirizine (ZYRTEC) 10 MG tablet Take 1 tablet (10 mg total) by mouth daily. 90 tablet 2  . Cholecalciferol (VITAMIN D) 50 MCG (2000 UT) CAPS Take by mouth.    Marland Kitchen CINNAMON PO Take 500 mg by  mouth daily.    Marland Kitchen co-enzyme Q-10 30 MG capsule Take 30 mg by mouth 3 (three) times daily.    Marland Kitchen denosumab (PROLIA) 60 MG/ML  SOSY injection Inject 60 mg into the skin every 6 (six) months.    . fluticasone furoate-vilanterol (BREO ELLIPTA) 100-25 MCG/INH AEPB Inhale 1 puff into the lungs daily. 30 each 3  . gabapentin (NEURONTIN) 100 MG capsule TAKE 1 TO 2 CAPSULES BY MOUTH AT BEDTIME 60 capsule 1  . Glucosamine-Chondroitin (GLUCOSAMINE CHONDR COMPLEX PO) Take 400 mg by mouth daily.    Marland Kitchen glucose blood (ACCU-CHEK AVIVA PLUS) test strip USE TO MONITOR BLOOD SUGAR THREE TIMES AWEEK 50 each 6  . hydrochlorothiazide (MICROZIDE) 12.5 MG capsule Take 1 capsule (12.5 mg total) by mouth daily. 90 capsule 3  . losartan (COZAAR) 100 MG tablet TAKE 1 TABLET BY MOUTH ONCE DAILY 90 tablet 2  . metoprolol succinate (TOPROL-XL) 50 MG 24 hr tablet TAKE 1 TABLET BY MOUTH ONCE A DAY WITH OR IMMEDIATELY FOLLOWING A MEAL 90 tablet 2  . Multiple Vitamin (MULTIVITAMIN) capsule Take 1 capsule by mouth daily.    . Multiple Vitamins-Minerals (HAIR SKIN AND NAILS FORMULA) TABS Take by mouth 3 (three) times daily.    . multivitamin-lutein (OCUVITE-LUTEIN) CAPS capsule Take 1 capsule by mouth daily.    . Omega-3 Fatty Acids (FISH OIL PO) Take 1,000 mg by mouth daily.    Marland Kitchen omeprazole (PRILOSEC) 20 MG capsule Take 1 capsule (20 mg total) by mouth daily as needed. 30 capsule 3  . PROAIR HFA 108 (90 Base) MCG/ACT inhaler INHALE 2 PUFFS INTO THE LUNGS BY MOUTH EVEYR 4 HOURS AS NEEDED (WITH SPACER) 8.5 g 3  . TURMERIC PO Take 1 tablet by mouth daily.    . vitamin C (ASCORBIC ACID) 500 MG tablet Take 500 mg by mouth daily.    . vitamin E 100 UNIT capsule Take by mouth daily.     No current facility-administered medications for this visit.     PHYSICAL EXAMINATION: ECOG PERFORMANCE STATUS: 0 - Asymptomatic  Vitals:   12/24/18 0955  BP: 137/70  Pulse: 88  Resp: 18  Temp: 98.7 F (37.1 C)  SpO2: 97%   Filed Weights   12/24/18 0955  Weight: 178 lb 3.2 oz (80.8 kg)    GENERAL:alert, no distress and comfortable SKIN: skin color, texture,  turgor are normal, no rashes or significant lesions EYES: normal, Conjunctiva are pink and non-injected, sclera clear OROPHARYNX:no exudate, no erythema and lips, buccal mucosa, and tongue normal  NECK: supple, thyroid normal size, non-tender, without nodularity.  Well-healed surgical scar at the base of his neck LYMPH:  no palpable lymphadenopathy in the cervical, axillary or inguinal LUNGS: clear to auscultation and percussion with normal breathing effort HEART: regular rate & rhythm and no murmurs and no lower extremity edema ABDOMEN:abdomen soft, non-tender and normal bowel sounds Musculoskeletal:no cyanosis of digits and no clubbing  NEURO: alert & oriented x 3 with fluent speech, no focal motor/sensory deficits  LABORATORY DATA:  I have reviewed the data as listed    Component Value Date/Time   NA 139 12/03/2018 0825   NA 143 08/15/2016 1037   K 4.2 12/03/2018 0825   K 4.8 08/15/2016 1037   CL 102 12/03/2018 0825   CL 103 07/16/2012 1304   CO2 25 12/03/2018 0825   CO2 27 08/15/2016 1037   GLUCOSE 137 (H) 12/03/2018 0825   GLUCOSE 111 08/15/2016 1037   GLUCOSE 112 (H) 07/16/2012 1304  BUN 19 12/03/2018 0825   BUN 12.7 08/15/2016 1037   CREATININE 0.87 12/03/2018 0825   CREATININE 0.9 08/15/2016 1037   CALCIUM 10.4 12/03/2018 0825   CALCIUM 10.5 (H) 08/15/2016 1037   PROT 7.0 12/03/2018 0825   PROT 7.9 08/15/2016 1037   ALBUMIN 4.4 09/19/2017 0935   ALBUMIN 4.5 08/15/2016 1037   AST 30 12/03/2018 0825   AST 30 08/15/2016 1037   ALT 32 (H) 12/03/2018 0825   ALT 29 08/15/2016 1037   ALKPHOS 64 09/19/2017 0935   ALKPHOS 67 08/15/2016 1037   BILITOT 0.7 12/03/2018 0825   BILITOT 0.80 08/15/2016 1037   GFRNONAA 62 12/05/2017 1000   GFRAA 72 12/05/2017 1000    No results found for: SPEP, UPEP  Lab Results  Component Value Date   WBC 8.0 12/03/2018   NEUTROABS 5,376 12/03/2018   HGB 14.2 12/03/2018   HCT 42.7 12/03/2018   MCV 90.7 12/03/2018   PLT 267 12/03/2018       Chemistry      Component Value Date/Time   NA 139 12/03/2018 0825   NA 143 08/15/2016 1037   K 4.2 12/03/2018 0825   K 4.8 08/15/2016 1037   CL 102 12/03/2018 0825   CL 103 07/16/2012 1304   CO2 25 12/03/2018 0825   CO2 27 08/15/2016 1037   BUN 19 12/03/2018 0825   BUN 12.7 08/15/2016 1037   CREATININE 0.87 12/03/2018 0825   CREATININE 0.9 08/15/2016 1037      Component Value Date/Time   CALCIUM 10.4 12/03/2018 0825   CALCIUM 10.5 (H) 08/15/2016 1037   ALKPHOS 64 09/19/2017 0935   ALKPHOS 67 08/15/2016 1037   AST 30 12/03/2018 0825   AST 30 08/15/2016 1037   ALT 32 (H) 12/03/2018 0825   ALT 29 08/15/2016 1037   BILITOT 0.7 12/03/2018 0825   BILITOT 0.80 08/15/2016 1037       All questions were answered. The patient knows to call the clinic with any problems, questions or concerns. No barriers to learning was detected.  I spent 10 minutes counseling the patient face to face. The total time spent in the appointment was 15 minutes and more than 50% was on counseling and review of test results  Heath Lark, MD 12/24/2018 10:26 AM

## 2018-12-24 NOTE — Assessment & Plan Note (Signed)
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 She is not symptomatic. She has no recurrence of disease elsewhere I recommend history, physical examination and blood work in 1 year and to defer imaging study unless there are signs and symptoms to suggest disease progression. She agreed.

## 2018-12-31 ENCOUNTER — Ambulatory Visit: Payer: Medicare Other | Admitting: Family Medicine

## 2019-01-04 ENCOUNTER — Other Ambulatory Visit: Payer: Self-pay | Admitting: Family Medicine

## 2019-01-04 MED ORDER — NEOMYCIN-POLYMYXIN-HC 3.5-10000-1 OT SOLN: 4.0000 [drp] | Freq: Four times a day (QID) | OTIC | 0 refills | Status: DC

## 2019-02-04 ENCOUNTER — Other Ambulatory Visit: Payer: Self-pay

## 2019-02-04 ENCOUNTER — Ambulatory Visit: Payer: Medicare Other

## 2019-02-04 ENCOUNTER — Ambulatory Visit (INDEPENDENT_AMBULATORY_CARE_PROVIDER_SITE_OTHER): Payer: Medicare Other

## 2019-02-04 DIAGNOSIS — Z23 Encounter for immunization: Secondary | ICD-10-CM | POA: Diagnosis not present

## 2019-02-04 DIAGNOSIS — M8588 Other specified disorders of bone density and structure, other site: Secondary | ICD-10-CM

## 2019-02-04 DIAGNOSIS — M8589 Other specified disorders of bone density and structure, multiple sites: Secondary | ICD-10-CM

## 2019-02-04 MED ORDER — DENOSUMAB 60 MG/ML ~~LOC~~ SOSY
60.0000 mg | PREFILLED_SYRINGE | SUBCUTANEOUS | Status: DC
  Administered 2019-02-04 – 2021-04-19 (×5): 60 mg via SUBCUTANEOUS

## 2019-02-04 NOTE — Progress Notes (Signed)
Patient came in today to receive her annual flu vaccine and her 6 month Prolia injection. Prolia was given in the right arm. High dose flu vaccine was given in the left deltoid. Patient tolerated both injections well.

## 2019-02-07 ENCOUNTER — Other Ambulatory Visit: Payer: Self-pay | Admitting: Family Medicine

## 2019-03-11 ENCOUNTER — Other Ambulatory Visit: Payer: Self-pay | Admitting: Family Medicine

## 2019-03-19 ENCOUNTER — Other Ambulatory Visit: Payer: Self-pay

## 2019-03-19 MED ORDER — ACCU-CHEK AVIVA PLUS VI STRP: ORAL_STRIP | 5 refills | Status: DC

## 2019-04-08 ENCOUNTER — Other Ambulatory Visit: Payer: Self-pay

## 2019-04-08 ENCOUNTER — Encounter: Payer: Self-pay | Admitting: Family Medicine

## 2019-04-08 ENCOUNTER — Ambulatory Visit (INDEPENDENT_AMBULATORY_CARE_PROVIDER_SITE_OTHER): Payer: Medicare Other | Admitting: Family Medicine

## 2019-04-08 VITALS — BP 128/62 | HR 80 | Temp 98.6°F | Resp 14 | Ht 62.0 in | Wt 163.0 lb

## 2019-04-08 DIAGNOSIS — G609 Hereditary and idiopathic neuropathy, unspecified: Secondary | ICD-10-CM

## 2019-04-08 DIAGNOSIS — R7303 Prediabetes: Secondary | ICD-10-CM | POA: Diagnosis not present

## 2019-04-08 DIAGNOSIS — E7800 Pure hypercholesterolemia, unspecified: Secondary | ICD-10-CM | POA: Diagnosis not present

## 2019-04-08 DIAGNOSIS — I10 Essential (primary) hypertension: Secondary | ICD-10-CM | POA: Diagnosis not present

## 2019-04-08 DIAGNOSIS — K760 Fatty (change of) liver, not elsewhere classified: Secondary | ICD-10-CM

## 2019-04-08 DIAGNOSIS — J4540 Moderate persistent asthma, uncomplicated: Secondary | ICD-10-CM

## 2019-04-08 NOTE — Assessment & Plan Note (Signed)
CONTINUE GABAPENTIN

## 2019-04-08 NOTE — Assessment & Plan Note (Signed)
Controlled on breo, flu shot UTD

## 2019-04-08 NOTE — Patient Instructions (Signed)
F/U 4 months for wellness visit We will call with lab results

## 2019-04-08 NOTE — Progress Notes (Signed)
   Subjective:    Patient ID: Cathy [name], female    DOB: May 23, 1946, 73 y.o.   MRN: [id number]  Patient presents for Follow-up (is fasting)   She has been intentionally tryin to lose weight, she has cut down on some of the sweets and has been walking    HTN- taking bp meds as prescribed no SE with meds  Borderline DM- due for recheck on A1C   Reflux controlled off meds    Asthma- using breo, she has had flu shot    Osteoporosis on Prolia    Reviewed oncology note from July  Weight down 15lbs since June  Review Of Systems:  GEN- denies fatigue, fever, weight loss,weakness, recent illness HEENT- denies eye drainage, change in vision, nasal discharge, CVS- denies chest pain, palpitations RESP- denies SOB, cough, wheeze ABD- denies N/V, change in stools, abd pain GU- denies dysuria, hematuria, dribbling, incontinence MSK- denies joint pain, muscle aches, injury Neuro- denies headache, dizziness, syncope, seizure activity       Objective:    BP 128/62   Pulse 80   Temp 98.6 F (37 C) (Oral)   Resp 14   Ht 5\' 2"  (1.575 m)   Wt 163 lb (73.9 kg)   SpO2 97%   BMI 29.81 kg/m  GEN- NAD, alert and oriented x3 HEENT- PERRL, EOMI, non injected sclera, pink conjunctiva, MMM, oropharynx clear Neck- Supple, no thyromegaly, small left supraclavicular node palpated, NT CVS- RRR, no murmur RESP-CTAB ABD-NABS,soft,NT,ND EXT- No edema Pulses- Radial, DP- 2+        Assessment & Plan:      Problem List Items Addressed This Visit      Unprioritized   Asthma    Controlled on breo, flu shot UTD      Essential hypertension - Primary    Controlled no changes She has lost some weight intentionally, will check labs 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 Any transcriptional errors that result from this process are unintentional.

## 2019-04-08 NOTE — Assessment & Plan Note (Signed)
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

## 2019-04-09 LAB — CBC WITH DIFFERENTIAL/PLATELET
Absolute Monocytes: 754 cells/uL (ref 200–950)
Basophils Absolute: 41 cells/uL (ref 0–200)
Basophils Relative: 0.5 %
Eosinophils Absolute: 139 cells/uL (ref 15–500)
Eosinophils Relative: 1.7 %
HCT: 42.4 % (ref 35.0–45.0)
Hemoglobin: 14 g/dL (ref 11.7–15.5)
Lymphs Abs: 1255 cells/uL (ref 850–3900)
MCH: 29.4 pg (ref 27.0–33.0)
MCHC: 33 g/dL (ref 32.0–36.0)
MCV: 89.1 fL (ref 80.0–100.0)
MPV: 10.3 fL (ref 7.5–12.5)
Monocytes Relative: 9.2 %
Neutro Abs: 6011 cells/uL (ref 1500–7800)
Neutrophils Relative %: 73.3 %
Platelets: 271 10*3/uL (ref 140–400)
RBC: 4.76 10*6/uL (ref 3.80–5.10)
RDW: 12.4 % (ref 11.0–15.0)
Total Lymphocyte: 15.3 %
WBC: 8.2 10*3/uL (ref 3.8–10.8)

## 2019-04-09 LAB — HEMOGLOBIN A1C
Hgb A1c MFr Bld: 5.9 % of total Hgb — ABNORMAL HIGH (ref <5.7)
Mean Plasma Glucose: 123 (calc)
eAG (mmol/L): 6.8 (calc)

## 2019-04-09 LAB — COMPREHENSIVE METABOLIC PANEL
AG Ratio: 1.7 (calc) (ref 1.0–2.5)
ALT: 20 U/L (ref 6–29)
AST: 24 U/L (ref 10–35)
Albumin: 4.5 g/dL (ref 3.6–5.1)
Alkaline phosphatase (APISO): 52 U/L (ref 37–153)
BUN: 12 mg/dL (ref 7–25)
CO2: 23 mmol/L (ref 20–32)
Calcium: 9.8 mg/dL (ref 8.6–10.4)
Chloride: 104 mmol/L (ref 98–110)
Creat: 0.85 mg/dL (ref 0.60–0.93)
Globulin: 2.6 g/dL (calc) (ref 1.9–3.7)
Glucose, Bld: 130 mg/dL — ABNORMAL HIGH (ref 65–99)
Potassium: 4.2 mmol/L (ref 3.5–5.3)
Sodium: 139 mmol/L (ref 135–146)
Total Bilirubin: 0.7 mg/dL (ref 0.2–1.2)
Total Protein: 7.1 g/dL (ref 6.1–8.1)

## 2019-04-09 LAB — LIPID PANEL
Cholesterol: 109 mg/dL (ref <200)
HDL: 37 mg/dL — ABNORMAL LOW (ref >=50)
LDL Cholesterol (Calc): 51 mg/dL (calc)
Non-HDL Cholesterol (Calc): 72 mg/dL (calc) (ref <130)
Total CHOL/HDL Ratio: 2.9 (calc) (ref <5.0)
Triglycerides: 127 mg/dL (ref <150)

## 2019-05-23 ENCOUNTER — Other Ambulatory Visit: Payer: Self-pay | Admitting: Cardiovascular Disease

## 2019-05-23 ENCOUNTER — Other Ambulatory Visit: Payer: Self-pay | Admitting: Family Medicine

## 2019-06-21 ENCOUNTER — Other Ambulatory Visit: Payer: Self-pay | Admitting: Family Medicine

## 2019-06-21 DIAGNOSIS — Z1231 Encounter for screening mammogram for malignant neoplasm of breast: Secondary | ICD-10-CM

## 2019-07-06 ENCOUNTER — Ambulatory Visit: Payer: Medicare PPO | Attending: Internal Medicine

## 2019-07-06 DIAGNOSIS — Z23 Encounter for immunization: Secondary | ICD-10-CM

## 2019-07-06 NOTE — Progress Notes (Signed)
   Covid-19 Vaccination Clinic  Name:  Cathy [name]    MRN: [id number] DOB: 10-06-1945  07/06/2019  Cathy [name] was observed post Covid-19 immunization for 15 minutes without incidence. She was provided with Vaccine Information Sheet and instruction to access the V-Safe system.   Cathy [name] was instructed to call 911 with any severe reactions post vaccine: Marland Kitchen Difficulty breathing  . Swelling of your face and throat  . A fast heartbeat  . A bad rash all over your body  . Dizziness and weakness    Immunizations Administered    Name Date Dose VIS Date Route   Pfizer COVID-19 Vaccine 07/06/2019 12:05 PM 0.3 mL 05/24/2019 Intramuscular   Manufacturer: [place]   Lot: [id number]   Agency: [id number]

## 2019-07-11 ENCOUNTER — Telehealth: Payer: Self-pay | Admitting: *Deleted

## 2019-07-11 NOTE — Telephone Encounter (Signed)
Received request from pharmacy for PA on Prolia [phone]) for provider buy and bill.  PA submitted via telephone with [employer].   Dx:M81.0- osteoporosis.   Determination to be faxed to office within 1-3 business days  Reference # [id number]

## 2019-07-12 NOTE — Telephone Encounter (Signed)
Received PA determination.   PA approved 07/11/2019- 06/12/2020.

## 2019-07-13 ENCOUNTER — Ambulatory Visit: Payer: Self-pay

## 2019-07-16 ENCOUNTER — Ambulatory Visit (INDEPENDENT_AMBULATORY_CARE_PROVIDER_SITE_OTHER): Payer: Medicare PPO | Admitting: Family Medicine

## 2019-07-16 ENCOUNTER — Encounter: Payer: Self-pay | Admitting: Family Medicine

## 2019-07-16 ENCOUNTER — Other Ambulatory Visit: Payer: Self-pay

## 2019-07-16 VITALS — BP 128/62 | HR 84 | Temp 97.7°F | Resp 12 | Ht 62.0 in | Wt 164.0 lb

## 2019-07-16 DIAGNOSIS — I10 Essential (primary) hypertension: Secondary | ICD-10-CM

## 2019-07-16 DIAGNOSIS — E7800 Pure hypercholesterolemia, unspecified: Secondary | ICD-10-CM | POA: Diagnosis not present

## 2019-07-16 DIAGNOSIS — J4540 Moderate persistent asthma, uncomplicated: Secondary | ICD-10-CM

## 2019-07-16 DIAGNOSIS — M25512 Pain in left shoulder: Secondary | ICD-10-CM

## 2019-07-16 DIAGNOSIS — K760 Fatty (change of) liver, not elsewhere classified: Secondary | ICD-10-CM | POA: Diagnosis not present

## 2019-07-16 DIAGNOSIS — G609 Hereditary and idiopathic neuropathy, unspecified: Secondary | ICD-10-CM

## 2019-07-16 DIAGNOSIS — Z0000 Encounter for general adult medical examination without abnormal findings: Secondary | ICD-10-CM | POA: Diagnosis not present

## 2019-07-16 DIAGNOSIS — R7303 Prediabetes: Secondary | ICD-10-CM | POA: Diagnosis not present

## 2019-07-16 DIAGNOSIS — R590 Localized enlarged lymph nodes: Secondary | ICD-10-CM | POA: Diagnosis not present

## 2019-07-16 DIAGNOSIS — M810 Age-related osteoporosis without current pathological fracture: Secondary | ICD-10-CM | POA: Diagnosis not present

## 2019-07-16 DIAGNOSIS — G8929 Other chronic pain: Secondary | ICD-10-CM

## 2019-07-16 NOTE — Progress Notes (Signed)
Subjective:   Patient presents for Medicare Annual/Subsequent preventive examination.   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  Not want any injections at this time it only occasionally hurts if she turns or twists the wrong way.  GERD- has not needed the prilosec   Neuropathy- takes gabapentin as needed not daily states that it just feels like she is walking on air bubbles at times, will plan for nerve conduction but she wants to hold off on this. Review Past Medical/Family/Social: per EMR   Risk Factors  Current exercise habits: none Dietary issues discussed:yes   Cardiac risk factors: Obesity (BMI >= 30 kg/m2). HTN, Hyperlipidemia   Depression Screen  (Note: if answer to either of the following is "Yes", a more complete depression screening is indicated)  Over the past two weeks, have you felt down, depressed or hopeless? No Over the past two weeks, have you felt little interest or pleasure in doing things? No Have you lost interest or pleasure in daily life? No Do you often feel hopeless? No Do you cry easily over simple problems? No   Activities of Daily Living  In your present state of health, do you have any difficulty performing the following activities?:  Driving? No  Managing money? No  Feeding yourself? No  Getting from bed to chair? No  Climbing a  flight of stairs? No  Preparing food and eating?: No  Bathing or showering? No  Getting dressed: No  Getting to the toilet? No  Using the toilet:No  Moving around from place to place: No  In the past year have you fallen or had a near fall?:No  Are you sexually active? No  Do you have more than one partner? No   Hearing Difficulties: No  Do you often ask people to speak up or repeat themselves? No  Do you experience ringing or noises in your ears? No Do you have difficulty understanding soft or whispered voices? No  Do you feel that you have a problem with memory? No Do you often misplace items? No  Do you feel safe at home? Yes  Cognitive Testing  Alert? Yes Normal Appearance?Yes  Oriented to person? Yes Place? Yes  Time? Yes  Recall of three objects? Yes  Can perform simple calculations? Yes  Displays appropriate judgment?Yes  Can read the correct time from a watch face?Yes   List the Names of Other Physician/Practitioners you currently use:    Dr. Herbert Deaner -Optometry    Oncology    Orthopedics      Screening Tests / Date Colonoscopy     UTD                Zostavax / Shingrix- UTD Mammogram  UTD done Feb 2020 Influenza Vaccine UTD  Tetanus/tdap  UTD Hep C screening  UTD Bone Density- Osteopprosos- 2020 COVID vaccine done 06/26/19  ROS: GEN- denies fatigue, fever, weight loss,weakness, recent illness HEENT- denies eye drainage, change in vision, nasal discharge, CVS- denies chest pain, palpitations RESP- denies SOB,+ cough, +wheeze ABD- denies N/V, change in stools, abd pain GU- denies dysuria, hematuria, dribbling, incontinence MSK- deni          es joint pain, muscle aches, injury Neuro- denies headache, dizziness, syncope, seizure activity  GEN- NAD, alert and oriented x3 HEENT- PERRL, EOMI, non injected sclera, pink conjunctiva, MMM, oropharynx clear Neck- Supple, no thryomegaly, no LAD, left supraclavicular node palpated ( Chronic) CVS- RRR, no  murmur RESP-few scattered wheeze, normal WOB, no rales or rhonchi ABD-NABS,soft,NT,ND EXT- No edema Neuro-CNII-XII in tact, decreased monofilament bilat great toes and 2nd and 3rd  Pulses- Radial, DP- 2+    Assessment:    Annual wellness medicare exam   Plan:    During the course of the visit the patient was educated and counseled about appropriate screening and preventive services including:    Prevention-  Mammogram - scheduled      Audit C/Fall/Depression screening Negative    Fasting labs to be done    Fatty Liver/Hyperlipidemia- continue statin drug,re check lipids   Peripheral neuropathy-continue as needed gabapentin.  When she is ready we will set up nerve conduction studies of the lower extremities there is been no progressive symptoms.   Pre-diabetes-no significant dietary changes.  We will recheck her A1c.  Arthritis of knees as well has shoulder where I think she also has a mild pinched nerve.  At this time which is going to monitor.  She does not have any constant pain into her shoulder.  She does follow with orthopedics and they can take a look at it whenever she is ready.  We discussed some exercises that she can do at home since she is unable to walk due to the weather.  Pt has advanced directives   Hilar lymphadenopathy followed by oncology once a year.  I will check labs as well as the LDH level No change in the left supraclavicular no   F/U 6 months   Diet review for nutrition referral? Yes ____ Not Indicated __x__ at this time.  I aksed her to please have her cardioloist send records since we have no Patient Instructions (the written plan) was given to the patient.  Medicare Attestation  I have personally reviewed:  The patient's medical and social history  Their use of alcohol, tobacco or illicit drugs  Their current medications and supplements  The patient's functional ability including ADLs,fall risks, home safety risks, cognitive, and  hearing and visual impairment  Diet and physical activities  Evidence for depression or mood disorders  The patient's weight, height, BMI, and visual acuity have been recorded in the chart. I have made referrals, counseling, and provided education to the patient based on review of the above and I have provided the patient with a written personalized care plan for preventive services.

## 2019-07-16 NOTE — Patient Instructions (Signed)
F/U 6 months  We will call with results  

## 2019-07-17 LAB — COMPREHENSIVE METABOLIC PANEL
AG Ratio: 1.6 (calc) (ref 1.0–2.5)
ALT: 21 U/L (ref 6–29)
AST: 27 U/L (ref 10–35)
Albumin: 4.8 g/dL (ref 3.6–5.1)
Alkaline phosphatase (APISO): 63 U/L (ref 37–153)
BUN: 15 mg/dL (ref 7–25)
CO2: 29 mmol/L (ref 20–32)
Calcium: 10.5 mg/dL — ABNORMAL HIGH (ref 8.6–10.4)
Chloride: 102 mmol/L (ref 98–110)
Creat: 0.77 mg/dL (ref 0.60–0.93)
Globulin: 3 g/dL (calc) (ref 1.9–3.7)
Glucose, Bld: 131 mg/dL — ABNORMAL HIGH (ref 65–99)
Potassium: 4.9 mmol/L (ref 3.5–5.3)
Sodium: 143 mmol/L (ref 135–146)
Total Bilirubin: 0.7 mg/dL (ref 0.2–1.2)
Total Protein: 7.8 g/dL (ref 6.1–8.1)

## 2019-07-17 LAB — CBC WITH DIFFERENTIAL/PLATELET
Absolute Monocytes: 1004 cells/uL — ABNORMAL HIGH (ref 200–950)
Basophils Absolute: 50 cells/uL (ref 0–200)
Basophils Relative: 0.6 %
Eosinophils Absolute: 125 cells/uL (ref 15–500)
Eosinophils Relative: 1.5 %
HCT: 42.3 % (ref 35.0–45.0)
Hemoglobin: 13.7 g/dL (ref 11.7–15.5)
Lymphs Abs: 1278 cells/uL (ref 850–3900)
MCH: 28.4 pg (ref 27.0–33.0)
MCHC: 32.4 g/dL (ref 32.0–36.0)
MCV: 87.8 fL (ref 80.0–100.0)
MPV: 9.7 fL (ref 7.5–12.5)
Monocytes Relative: 12.1 %
Neutro Abs: 5843 cells/uL (ref 1500–7800)
Neutrophils Relative %: 70.4 %
Platelets: 295 10*3/uL (ref 140–400)
RBC: 4.82 10*6/uL (ref 3.80–5.10)
RDW: 12.6 % (ref 11.0–15.0)
Total Lymphocyte: 15.4 %
WBC: 8.3 10*3/uL (ref 3.8–10.8)

## 2019-07-17 LAB — LIPID PANEL
Cholesterol: 122 mg/dL (ref <200)
HDL: 41 mg/dL — ABNORMAL LOW (ref >=50)
LDL Cholesterol (Calc): 57 mg/dL (calc)
Non-HDL Cholesterol (Calc): 81 mg/dL (calc) (ref <130)
Total CHOL/HDL Ratio: 3 (calc) (ref <5.0)
Triglycerides: 164 mg/dL — ABNORMAL HIGH (ref <150)

## 2019-07-17 LAB — HEMOGLOBIN A1C
Hgb A1c MFr Bld: 6.1 % of total Hgb — ABNORMAL HIGH (ref <5.7)
Mean Plasma Glucose: 128 (calc)
eAG (mmol/L): 7.1 (calc)

## 2019-07-17 LAB — LACTATE DEHYDROGENASE: LDH: 176 U/L (ref 120–250)

## 2019-07-18 DIAGNOSIS — Z471 Aftercare following joint replacement surgery: Secondary | ICD-10-CM | POA: Diagnosis not present

## 2019-07-18 DIAGNOSIS — Z96652 Presence of left artificial knee joint: Secondary | ICD-10-CM | POA: Diagnosis not present

## 2019-07-27 ENCOUNTER — Ambulatory Visit: Payer: Medicare PPO | Attending: Internal Medicine

## 2019-07-27 DIAGNOSIS — Z23 Encounter for immunization: Secondary | ICD-10-CM

## 2019-07-27 NOTE — Progress Notes (Signed)
   Covid-19 Vaccination Clinic  Name:  Cathy [name]    MRN: [id number] DOB: [birth year]  07/27/2019  [name] was observed post Covid-19 immunization for 15 minutes without incidence. She was provided with Vaccine Information Sheet and instruction to access the V-Safe system.   [name] was instructed to call 911 with any severe reactions post vaccine: Marland Kitchen Difficulty breathing  . Swelling of your face and throat  . A fast heartbeat  . A bad rash all over your body  . Dizziness and weakness    Immunizations Administered    Name Date Dose VIS Date Route   Pfizer COVID-19 Vaccine 07/27/2019 10:46 AM 0.3 mL 05/24/2019 Intramuscular   Manufacturer: [place]   Lot: [id number]   [place]: [id number]

## 2019-08-03 ENCOUNTER — Ambulatory Visit: Payer: Self-pay

## 2019-08-05 ENCOUNTER — Ambulatory Visit
Admission: RE | Admit: 2019-08-05 | Discharge: 2019-08-05 | Disposition: A | Discharge status: Home / Self Care | Payer: Medicare PPO | Source: Ambulatory Visit | Attending: Family Medicine | Admitting: Family Medicine

## 2019-08-05 ENCOUNTER — Other Ambulatory Visit: Payer: Self-pay

## 2019-08-05 DIAGNOSIS — Z1231 Encounter for screening mammogram for malignant neoplasm of breast: Secondary | ICD-10-CM

## 2019-08-06 ENCOUNTER — Ambulatory Visit: Payer: Medicare Other | Admitting: Cardiovascular Disease

## 2019-08-08 ENCOUNTER — Ambulatory Visit (INDEPENDENT_AMBULATORY_CARE_PROVIDER_SITE_OTHER): Payer: Medicare PPO | Admitting: *Deleted

## 2019-08-08 ENCOUNTER — Other Ambulatory Visit: Payer: Self-pay

## 2019-08-08 DIAGNOSIS — M8589 Other specified disorders of bone density and structure, multiple sites: Secondary | ICD-10-CM | POA: Diagnosis not present

## 2019-08-08 DIAGNOSIS — M810 Age-related osteoporosis without current pathological fracture: Secondary | ICD-10-CM

## 2019-08-08 NOTE — Progress Notes (Signed)
Patient seen in office for Prolia injection.   Tolerated administration well.

## 2019-08-16 ENCOUNTER — Other Ambulatory Visit: Payer: Self-pay | Admitting: Cardiovascular Disease

## 2019-08-20 NOTE — Progress Notes (Signed)
Cardiology Office Note   Date:  09/03/2019   ID:  Nadean, Sapir 1945-12-06, MRN [id number]  PCP:  Alycia Rossetti, MD  Cardiologist:   Jenkins Rouge, MD   No chief complaint on file.     History of Present Illness:  74 y.o. first seen 08/09/16 for chest pain. Atypical with GI overtones GERD CRF;s HTN DM-2  Previous echo 11/03/14 EF 55-60% Had f/u myovue 08/16/16 normal no ischemia EF 60%   Has had adenopathy in neck chest and upper abdomen with biopsy showing atypical lymphoid hyperplasia.  Sees oncology [name] for this.  Asthma has been using inhaler given Breo by Dr Lonell Grandchild 06/12/17   She is sedentary and eats too much Son MD in [place] has a 56 yo and 60 yo grand-children  Received COVID vaccine 07/06/19   No cardiac issues needs refill on diuretic which has worked well for BP Has 13/8 yo grand children  Past Medical History:  Diagnosis Date  . Abnormal glucose   . Allergy   . Asthma    Allergy induced  . Benign breast cyst in female   . GERD (gastroesophageal reflux disease)   . Hilar lymphadenopathy 08/01/2011  . History of nuclear stress test 2009   Treadmill and Stress Myoview- no CAD  . Hyperlipidemia   . Hypertension   . Lymphadenopathy of left cervical region 08/01/2011  . Nephrolithiasis 1984  . Osteopenia   . PONV (postoperative nausea and vomiting)   . Post-menopause   . Pre-diabetes    borderline  . 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 Vision changes     Past Surgical History:  Procedure Laterality Date  . CHOLECYSTECTOMY    . LITHOTRIPSY  1984  . LYMPH NODE BIOPSY    . 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 aspirin EC 81 MG tablet Take 81 mg by mouth daily.    Marland Kitchen atorvastatin (LIPITOR) 20 MG tablet TAKE 1 TABLET BY  MOUTH ONCE DAILY 90 tablet 3  . cetirizine (ZYRTEC) 10 MG tablet TAKE 1 TABLET BY MOUTH ONCE A DAY 90 tablet 2  . Cholecalciferol (VITAMIN D) 50 MCG (2000 UT) CAPS Take by mouth.    Marland Kitchen CINNAMON PO Take 500 mg by mouth daily.    Marland Kitchen co-enzyme Q-10 30 MG capsule Take 30 mg by mouth 3 (three) times daily.    Marland Kitchen denosumab (PROLIA) 60 MG/ML SOSY injection Inject 60 mg into the skin every 6 (six) months.    . fluticasone furoate-vilanterol (BREO ELLIPTA) 100-25 MCG/INH AEPB Inhale 1 puff into the lungs daily. 30 each 3  . gabapentin (NEURONTIN) 100 MG capsule TAKE 1 TO 2 CAPSULES BY MOUTH AT BEDTIME 60 capsule 1  . Glucosamine-Chondroitin (GLUCOSAMINE CHONDR COMPLEX PO) Take 400 mg by mouth daily.    Marland Kitchen glucose blood (ACCU-CHEK AVIVA PLUS) test strip Use to Monitor blood sugars daily 100 each 5  . hydrochlorothiazide (MICROZIDE) 12.5 MG capsule TAKE 1 CAPSULE BY MOUTH ONCE DAILY 90 capsule 0  . losartan (COZAAR) 100 MG tablet TAKE 1 TABLET BY MOUTH ONCE A DAY 90 tablet 2  . metoprolol succinate (TOPROL-XL) 50 MG 24 hr tablet TAKE 1 TABLET BY MOUTH ONCE A DAY WITH OR IMMEDIATELY FOLLOWING A MEAL 90 tablet 2  .  Multiple Vitamin (MULTIVITAMIN) capsule Take 1 capsule by mouth daily.    . Multiple Vitamins-Minerals (HAIR SKIN AND NAILS FORMULA) TABS Take by mouth 3 (three) times daily.    . multivitamin-lutein (OCUVITE-LUTEIN) CAPS capsule Take 1 capsule by mouth daily.    . Omega-3 Fatty Acids (FISH OIL PO) Take 1,000 mg by mouth 2 (two) times daily.     Marland Kitchen omeprazole (PRILOSEC) 20 MG capsule Take 1 capsule (20 mg total) by mouth daily as needed. 30 capsule 3  . PROAIR HFA 108 (90 Base) MCG/ACT inhaler INHALE 2 PUFFS INTO THE LUNGS BY MOUTH EVEYR 4 HOURS AS NEEDED (WITH SPACER) 8.5 g 3  . TURMERIC PO Take 1 tablet by mouth daily.    . vitamin C (ASCORBIC ACID) 500 MG tablet Take 500 mg by mouth daily.    . vitamin E 100 UNIT capsule Take by mouth daily.    . Zinc 50 MG TABS Take by mouth.     Current  Facility-Administered Medications  Medication Dose Route Frequency Provider Last Rate Last Admin  . denosumab (PROLIA) injection 60 mg  60 mg Subcutaneous Q6 months Alycia Rossetti, MD   60 mg at 08/08/19 [id number]    Allergies:   Codeine    Social History:  The patient  reports that she has never smoked. She has never used smokeless tobacco. She reports that she does not drink alcohol or use drugs.   Family History:  The patient's family history includes Alcohol abuse in her brother; Asthma in her sister; Breast cancer in her cousin; Cancer in her father, maternal aunt, and maternal aunt; Dementia in her mother; Diabetes in her brother; Heart attack in her mother; Hypertension in her mother; Stroke in her mother.    ROS:  Please see the history of present illness.   Otherwise, review of systems are positive for none.   All other systems are reviewed and negative.    PHYSICAL EXAM: VS:  BP 124/74   Pulse 75   Ht 5\' 2"  (1.575 m)   Wt 164 lb (74.4 kg)   SpO2 97%   BMI 30.00 kg/m  , BMI Body mass index is 30 kg/m. Affect appropriate Healthy:  appears stated age 74: mild left adenopathy  Neck supple with cervical lymphadenopathy  JVP normal no bruits no thyromegaly Lungs clear with no wheezing and good diaphragmatic motion Heart:  S1/S2 no murmur, no rub, gallop or click PMI normal Abdomen: benighn, BS positve, no tenderness, no AAA no bruit.  No HSM or HJR Distal pulses intact with no bruits Left LLE  Edema post left TKR  Neuro non-focal Skin warm and dry No muscular weakness     EKG:   09/03/19 SR rate 75 normal   Recent Labs: 07/16/2019: ALT 21; BUN 15; Creat 0.77; Hemoglobin 13.7; Platelets 295; Potassium 4.9; Sodium 143    Lipid Panel    Component Value Date/Time   CHOL 122 07/16/2019 1016   TRIG 164 (H) 07/16/2019 1016   HDL 41 (L) 07/16/2019 1016   CHOLHDL 3.0 07/16/2019 1016   VLDL 34 (H) 12/06/2016 0932   LDLCALC 57 07/16/2019 1016      Wt Readings  from Last 3 Encounters:  09/03/19 164 lb (74.4 kg)  07/16/19 164 lb (74.4 kg)  04/08/19 163 lb (73.9 kg)      Other studies Reviewed: Additional studies/ records that were reviewed today include: notes Dr Buelah Manis labs and ECG Echo 2016.    ASSESSMENT AND PLAN:  1. Chest Pain atypical normal myovue 08/16/16 observe  2. GERD: continue H2 blocker low carb diet avoid excess NSAI's 3. DM  Discussed low carb diet.  Target hemoglobin A1c is 6.5 or less.  Continue current medications. 4. Lymphadenopathy f/u [name] needs f/u CT scanning   5. HTN:  Elevated with LE edema on left start HCTZ 12.5 mg daily  F/u bMET in 4 weeks with son  6. Lipids:  On statin LDL at goal with no documented vascular disease    F/u in a year  Jenkins Rouge

## 2019-09-03 ENCOUNTER — Encounter: Payer: Self-pay | Admitting: Cardiovascular Disease

## 2019-09-03 ENCOUNTER — Ambulatory Visit: Payer: Medicare PPO | Admitting: Cardiovascular Disease

## 2019-09-03 ENCOUNTER — Other Ambulatory Visit: Payer: Self-pay

## 2019-09-03 VITALS — BP 124/74 | HR 75 | Ht 62.0 in | Wt 164.0 lb

## 2019-09-03 DIAGNOSIS — I10 Essential (primary) hypertension: Secondary | ICD-10-CM

## 2019-09-03 MED ORDER — HYDROCHLOROTHIAZIDE 12.5 MG PO CAPS: 12.5000 mg | ORAL_CAPSULE | Freq: Every day | ORAL | 3 refills | Status: DC

## 2019-09-03 NOTE — Patient Instructions (Signed)

## 2019-11-12 ENCOUNTER — Other Ambulatory Visit: Payer: Self-pay | Admitting: Family Medicine

## 2019-12-13 ENCOUNTER — Telehealth: Payer: Self-pay

## 2019-12-13 NOTE — Telephone Encounter (Signed)
Called and given below message. She can move appt. She is only available on Monday. Rescheudled to 7/26 at 8:20 am at her request.

## 2019-12-13 NOTE — Telephone Encounter (Signed)
-----   Message from Heath Lark, MD sent at 12/13/2019 11:31 AM EDT ----- Regarding: appt Can she come in the next day on 7/14 at 1045? 15 mins no labs

## 2019-12-23 DIAGNOSIS — H43813 Vitreous degeneration, bilateral: Secondary | ICD-10-CM | POA: Diagnosis not present

## 2019-12-23 DIAGNOSIS — H5319 Other subjective visual disturbances: Secondary | ICD-10-CM | POA: Diagnosis not present

## 2019-12-24 ENCOUNTER — Ambulatory Visit: Payer: Medicare Other | Admitting: Hematology and Oncology

## 2020-01-06 ENCOUNTER — Inpatient Hospital Stay: Payer: Medicare PPO | Attending: Hematology and Oncology | Admitting: Hematology and Oncology

## 2020-01-06 ENCOUNTER — Other Ambulatory Visit: Payer: Self-pay

## 2020-01-06 ENCOUNTER — Encounter: Payer: Self-pay | Admitting: Hematology and Oncology

## 2020-01-06 ENCOUNTER — Telehealth: Payer: Self-pay | Admitting: Hematology and Oncology

## 2020-01-06 DIAGNOSIS — Z7982 Long term (current) use of aspirin: Secondary | ICD-10-CM | POA: Diagnosis not present

## 2020-01-06 DIAGNOSIS — R590 Localized enlarged lymph nodes: Secondary | ICD-10-CM

## 2020-01-06 DIAGNOSIS — Z79899 Other long term (current) drug therapy: Secondary | ICD-10-CM | POA: Insufficient documentation

## 2020-01-06 DIAGNOSIS — R768 Other specified abnormal immunological findings in serum: Secondary | ICD-10-CM | POA: Diagnosis not present

## 2020-01-06 NOTE — Progress Notes (Signed)
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 She is not symptomatic. She has no recurrence of disease elsewhere or progression I recommend history, physical examination and blood work in 1 year and to defer imaging study unless there are signs and symptoms to suggest disease progression. She agreed. Her blood work that was drawn in the last few times were within normal limits   No orders of the defined types were placed in this encounter.   All questions were answered. The patient knows to call the clinic with any problems, questions or concerns. The total time spent in the appointment was 15 minutes encounter with patients including review of chart and various tests results, discussions about plan of care and coordination of care plan   Heath Lark, MD 01/06/2020 9:05 AM  INTERVAL HISTORY: Please see below for problem oriented charting. She returns for further follow-up She is doing well She has intentional weight loss through dietary changes She has no new lymphadenopathy She has occasional sweats at night but nondrenching in nature No recent infection, fever or chills  SUMMARY OF ONCOLOGIC HISTORY:  I reviewed the patient's records extensive and collaborated the history with the patient. 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 She had an incisional biopsy on October 25 which showed atypical lymphoid hyperplasia. Biopsies were sent for second opinion. This appeared to be a T-cell process with  increased CD4 positive T lymphocytes but a firm diagnosis of lymphoma could not be established. CT scans of the neck chest abdomen and pelvis showed adenopathy in the left supraclavicular and left axillary regions and a single large right paratracheal lymph node 2 cm in diameter. No abdominal or pelvic adenopathy and no splenomegaly.  She had no constitutional symptoms. Lab testing showed previous exposure to EBV and CMV viruses with elevated IgG but not IgM titers. There was also a significant elevation of IgG against toxoplasmosis. She had no history of exposure to cats or cat litter. She was not anemic. She had a normal white blood count and differential. Serum LDH normal. ESR 4 mm.  I elected to follow her with close observation alone and serial CT scans. She has remained asymptomatic.  REVIEW OF SYSTEMS:   Eyes: Denies blurriness of vision Ears, nose, mouth, throat, and face: Denies mucositis or sore throat Respiratory: Denies cough, dyspnea or wheezes Cardiovascular: Denies palpitation, chest discomfort or lower extremity swelling Gastrointestinal:  Denies nausea, heartburn or change in bowel habits Skin: Denies abnormal skin rashes Lymphatics: Denies new lymphadenopathy or easy bruising Neurological:Denies numbness, tingling or new weaknesses Behavioral/Psych: Mood is stable, no new changes  All other systems were reviewed with the patient and are negative.  I have reviewed the past medical history, past surgical history, social history and family history with the patient and they are unchanged from previous note.  ALLERGIES:  is allergic to codeine.  MEDICATIONS:  Current Outpatient Medications  Medication Sig Dispense Refill  . ibuprofen (ADVIL) 200 MG tablet Take 200 mg by mouth every 6 (six) hours as needed.    . vitamin B-12 (CYANOCOBALAMIN) 100 MCG tablet Take  100 mcg by mouth daily.    . vitamin B-12 (CYANOCOBALAMIN) 1000 MCG tablet Take 1,000 mcg by mouth daily.    Marland Kitchen aspirin  EC 81 MG tablet Take 81 mg by mouth daily.    Marland Kitchen atorvastatin (LIPITOR) 20 MG tablet TAKE 1 TABLET BY MOUTH ONCE DAILY 90 tablet 3  . cetirizine (ZYRTEC) 10 MG tablet TAKE 1 TABLET BY MOUTH ONCE DAILY 90 tablet 2  . Cholecalciferol (VITAMIN D) 50 MCG (2000 UT) CAPS Take by mouth.    Marland Kitchen CINNAMON PO Take 500 mg by mouth daily.    Marland Kitchen co-enzyme Q-10 30 MG capsule Take 30 mg by mouth 3 (three) times daily.    Marland Kitchen denosumab (PROLIA) 60 MG/ML SOSY injection Inject 60 mg into the skin every 6 (six) months.    . fluticasone furoate-vilanterol (BREO ELLIPTA) 100-25 MCG/INH AEPB Inhale 1 puff into the lungs daily. 30 each 3  . gabapentin (NEURONTIN) 100 MG capsule TAKE 1 TO 2 CAPSULES BY MOUTH AT BEDTIME 60 capsule 1  . Glucosamine-Chondroitin (GLUCOSAMINE CHONDR COMPLEX PO) Take 400 mg by mouth daily.    Marland Kitchen glucose blood (ACCU-CHEK AVIVA PLUS) test strip Use to Monitor blood sugars daily 100 each 5  . hydrochlorothiazide (MICROZIDE) 12.5 MG capsule Take 1 capsule (12.5 mg total) by mouth daily. 90 capsule 3  . losartan (COZAAR) 100 MG tablet TAKE 1 TABLET BY MOUTH ONCE A DAY 90 tablet 2  . metoprolol succinate (TOPROL-XL) 50 MG 24 hr tablet TAKE 1 TABLET BY MOUTH ONCE A DAY WITH OR IMMEDIATELY FOLLOWING A MEAL 90 tablet 2  . Multiple Vitamin (MULTIVITAMIN) capsule Take 1 capsule by mouth daily.    . Multiple Vitamins-Minerals (HAIR SKIN AND NAILS FORMULA) TABS Take by mouth 3 (three) times daily.    . multivitamin-lutein (OCUVITE-LUTEIN) CAPS capsule Take 1 capsule by mouth daily.    . Omega-3 Fatty Acids (FISH OIL PO) Take 1,000 mg by mouth 2 (two) times daily.     Marland Kitchen omeprazole (PRILOSEC) 20 MG capsule Take 1 capsule (20 mg total) by mouth daily as needed. 30 capsule 3  . PROAIR HFA 108 (90 Base) MCG/ACT inhaler INHALE 2 PUFFS INTO THE LUNGS BY MOUTH EVEYR 4 HOURS AS NEEDED (WITH SPACER) 8.5 g 3  . TURMERIC PO Take 1 tablet by mouth daily.    . vitamin C (ASCORBIC ACID) 500 MG tablet Take 500 mg by mouth  daily.    . vitamin E 100 UNIT capsule Take by mouth daily.    . Zinc 50 MG TABS Take by mouth.     Current Facility-Administered Medications  Medication Dose Route Frequency Provider Last Rate Last Admin  . denosumab (PROLIA) injection 60 mg  60 mg Subcutaneous Q6 months Alycia Rossetti, MD   60 mg at 08/08/19 0758    PHYSICAL EXAMINATION: ECOG PERFORMANCE STATUS: 0 - Asymptomatic  Vitals:   01/06/20 0858  BP: (!) 132/96  Pulse: 90  Resp: 18  Temp: 98.1 F (36.7 C)   Filed Weights   01/06/20 0858  Weight: 163 lb 12.8 oz (74.3 kg)    GENERAL:alert, no distress and comfortable SKIN: skin color, texture, turgor are normal, no rashes or significant lesions EYES: normal, Conjunctiva are pink and non-injected, sclera clear OROPHARYNX:no exudate, no erythema and lips, buccal mucosa, and tongue normal  NECK: supple, thyroid normal size, non-tender, without nodularity LYMPH:  no palpable lymphadenopathy in the cervical, axillary or inguinal LUNGS: clear to auscultation and percussion with normal breathing effort  HEART: regular rate & rhythm and no murmurs and no lower extremity edema ABDOMEN:abdomen soft, non-tender and normal bowel sounds Musculoskeletal:no cyanosis of digits and no clubbing  NEURO: alert & oriented x 3 with fluent speech, no focal motor/sensory deficits  LABORATORY DATA:  I have reviewed the data as listed    Component Value Date/Time   NA 143 07/16/2019 1016   NA 143 08/15/2016 1037   K 4.9 07/16/2019 1016   K 4.8 08/15/2016 1037   CL 102 07/16/2019 1016   CL 103 07/16/2012 1304   CO2 29 07/16/2019 1016   CO2 27 08/15/2016 1037   GLUCOSE 131 (H) 07/16/2019 1016   GLUCOSE 111 08/15/2016 1037   GLUCOSE 112 (H) 07/16/2012 1304   BUN 15 07/16/2019 1016   BUN 12.7 08/15/2016 1037   CREATININE 0.77 07/16/2019 1016   CREATININE 0.9 08/15/2016 1037   CALCIUM 10.5 (H) 07/16/2019 1016   CALCIUM 10.5 (H) 08/15/2016 1037   PROT 7.8 07/16/2019 1016   PROT  7.9 08/15/2016 1037   ALBUMIN 4.4 09/19/2017 0935   ALBUMIN 4.5 08/15/2016 1037   AST 27 07/16/2019 1016   AST 30 08/15/2016 1037   ALT 21 07/16/2019 1016   ALT 29 08/15/2016 1037   ALKPHOS 64 09/19/2017 0935   ALKPHOS 67 08/15/2016 1037   BILITOT 0.7 07/16/2019 1016   BILITOT 0.80 08/15/2016 1037   GFRNONAA 62 12/05/2017 1000   GFRAA 72 12/05/2017 1000    No results found for: SPEP, UPEP  Lab Results  Component Value Date   WBC 8.3 07/16/2019   NEUTROABS 5,843 07/16/2019   HGB 13.7 07/16/2019   HCT 42.3 07/16/2019   MCV 87.8 07/16/2019   PLT 295 07/16/2019      Chemistry      Component Value Date/Time   NA 143 07/16/2019 1016   NA 143 08/15/2016 1037   K 4.9 07/16/2019 1016   K 4.8 08/15/2016 1037   CL 102 07/16/2019 1016   CL 103 07/16/2012 1304   CO2 29 07/16/2019 1016   CO2 27 08/15/2016 1037   BUN 15 07/16/2019 1016   BUN 12.7 08/15/2016 1037   CREATININE 0.77 07/16/2019 1016   CREATININE 0.9 08/15/2016 1037      Component Value Date/Time   CALCIUM 10.5 (H) 07/16/2019 1016   CALCIUM 10.5 (H) 08/15/2016 1037   ALKPHOS 64 09/19/2017 0935   ALKPHOS 67 08/15/2016 1037   AST 27 07/16/2019 1016   AST 30 08/15/2016 1037   ALT 21 07/16/2019 1016   ALT 29 08/15/2016 1037   BILITOT 0.7 07/16/2019 1016   BILITOT 0.80 08/15/2016 1037

## 2020-01-06 NOTE — Assessment & Plan Note (Signed)
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 She is not symptomatic. She has no recurrence of disease elsewhere or progression I recommend history, physical examination and blood work in 1 year and to defer imaging study unless there are signs and symptoms to suggest disease progression. She agreed. Her blood work that was drawn in the last few times were within normal limits

## 2020-01-06 NOTE — Telephone Encounter (Signed)
Scheduled appts per 7/26 sch msg. Gave pt a print out of AVS.

## 2020-01-13 ENCOUNTER — Ambulatory Visit: Payer: Medicare PPO | Admitting: Family Medicine

## 2020-01-13 ENCOUNTER — Ambulatory Visit (INDEPENDENT_AMBULATORY_CARE_PROVIDER_SITE_OTHER): Payer: Medicare PPO | Admitting: Family Medicine

## 2020-01-13 ENCOUNTER — Other Ambulatory Visit: Payer: Self-pay

## 2020-01-13 ENCOUNTER — Encounter: Payer: Self-pay | Admitting: Family Medicine

## 2020-01-13 VITALS — BP 126/74 | HR 97 | Temp 98.1°F | Resp 16 | Ht 62.0 in | Wt 162.0 lb

## 2020-01-13 DIAGNOSIS — I10 Essential (primary) hypertension: Secondary | ICD-10-CM | POA: Diagnosis not present

## 2020-01-13 DIAGNOSIS — R6889 Other general symptoms and signs: Secondary | ICD-10-CM

## 2020-01-13 DIAGNOSIS — E559 Vitamin D deficiency, unspecified: Secondary | ICD-10-CM | POA: Diagnosis not present

## 2020-01-13 DIAGNOSIS — R5383 Other fatigue: Secondary | ICD-10-CM | POA: Diagnosis not present

## 2020-01-13 DIAGNOSIS — E7800 Pure hypercholesterolemia, unspecified: Secondary | ICD-10-CM | POA: Diagnosis not present

## 2020-01-13 DIAGNOSIS — R590 Localized enlarged lymph nodes: Secondary | ICD-10-CM

## 2020-01-13 DIAGNOSIS — R7303 Prediabetes: Secondary | ICD-10-CM | POA: Diagnosis not present

## 2020-01-13 NOTE — Progress Notes (Signed)
Subjective:    Patient ID: Cathy [name], female    DOB: 09-05-1945, 74 y.o.   MRN: [id number]  Patient presents for Follow-up (is not fasting) and Fatigue  Pt here to f/u chronic medical problems.  Her major concern is that she has been more fatigued than normal.  She was seen by oncology recently no labs were performed at that time.  He did not notice any increase in her supraclavicular lymphadenopathy which they have been monitored for the past few years.  She is not had any weight loss or other B symptoms.  Her blood pressure has been controlled.  Her sugars not been elevated.  Some GI upset with a burning sensation epigastric region about a week ago.  She restarted omeprazole and that has resolved.  She is also had some increased aches and pains in the joints but no swelling she has not had any falls.  Medications reviewed  HTN- losrtan HCTZ, TOPROL   Hyperllipidemia- taking lipitor as prescribed    osteoprorosis- Prolia   With regard to stress.  States that she is in a better place now.  COVID-19 and the shutdown was very difficult for her not being able to see family and friends like she was previously doing. Sleep is at her baseline   Review Of Systems:  GEN- + fatigue, fever, weight loss,weakness, recent illness HEENT- denies eye drainage, change in vision, nasal discharge, CVS- denies chest pain, palpitations RESP- denies SOB, cough, wheeze ABD- denies N/V, change in stools, abd pain GU- denies dysuria, hematuria, dribbling, incontinence MSK- denies joint pain, muscle aches, injury Neuro- denies headache, dizziness, syncope, seizure activity       Objective:    BP 126/74    Pulse 97    Temp 98.1 F (36.7 C) (Temporal)    Resp 16    Ht 5\' 2"  (1.575 m)    Wt 162 lb (73.5 kg)    SpO2 98%    BMI 29.63 kg/m                            GEN-NAD,alert and oriented x 3   HEENT- PERRL, EOMI, non injected sclera, pink conjunctiva, MMM, oropharynx clear Neck- Supple, no  thryomegaly, no LAD, left supraclavicular node palpated ( Chronic) CVS- RRR, no murmur RESPCTAB ABD-NABS,soft,NT,ND PSYCH- Normal affect and mood  EXT- No edema       Assessment & Plan:      Problem List Items Addressed This Visit      Unprioritized   Essential hypertension - Primary   Relevant Orders   CBC with Differential/Platelet (Completed)   Comprehensive metabolic panel (Completed)   TSH (Completed)   Hyperlipidemia   Relevant Orders   Lipid panel (Completed)   Lymphadenopathy of left cervical region    Reviewed oncology note.  She does not have any symptoms to suggest that her lymphadenopathy has converted to true lymphoma however will check WBCs and her other labs.  They are holding on imaging at this time.  Her lymph nodes have not enlarged.  She has not had any weight loss.  Appetite is good.  Blood pressure is controlled.  Her blood sugars are also controlled.  We will check thyroid also check B12 level, and vitamin D level she does have history of deficiency.  No sign of infection today on exam.  Regarding  the epigastric pain reflux symptoms this has improved with omeprazole will take this for another  week or so then can go back to as needed.  She thinks that there was some things that she was eating that caused her stomach to flareup.      Pre-diabetes   Relevant Orders   Lipid panel (Completed)   Hemoglobin A1c (Completed)    Other Visit Diagnoses    Fatigue, unspecified type       Relevant Orders   Lactate dehydrogenase (Completed)   Vitamin D deficiency       Relevant Orders   Vitamin D, 25-hydroxy (Completed)   Other general symptoms and signs       Relevant Orders   Vitamin B12 (Completed)      Note: This dictation was prepared with Dragon dictation along with smaller phrase technology. Any transcriptional errors that result from this process are unintentional.

## 2020-01-14 ENCOUNTER — Encounter: Payer: Self-pay | Admitting: Family Medicine

## 2020-01-14 LAB — LIPID PANEL
Cholesterol: 130 mg/dL (ref <200)
HDL: 33 mg/dL — ABNORMAL LOW (ref >=50)
LDL Cholesterol (Calc): 76 mg/dL (calc)
Non-HDL Cholesterol (Calc): 97 mg/dL (calc) (ref <130)
Total CHOL/HDL Ratio: 3.9 (calc) (ref <5.0)
Triglycerides: 133 mg/dL (ref <150)

## 2020-01-14 LAB — COMPREHENSIVE METABOLIC PANEL
AG Ratio: 1.4 (calc) (ref 1.0–2.5)
ALT: 11 U/L (ref 6–29)
AST: 19 U/L (ref 10–35)
Albumin: 4.5 g/dL (ref 3.6–5.1)
Alkaline phosphatase (APISO): 81 U/L (ref 37–153)
BUN: 16 mg/dL (ref 7–25)
CO2: 31 mmol/L (ref 20–32)
Calcium: 10.1 mg/dL (ref 8.6–10.4)
Chloride: 100 mmol/L (ref 98–110)
Creat: 0.81 mg/dL (ref 0.60–0.93)
Globulin: 3.2 g/dL (calc) (ref 1.9–3.7)
Glucose, Bld: 115 mg/dL — ABNORMAL HIGH (ref 65–99)
Potassium: 4.2 mmol/L (ref 3.5–5.3)
Sodium: 140 mmol/L (ref 135–146)
Total Bilirubin: 0.7 mg/dL (ref 0.2–1.2)
Total Protein: 7.7 g/dL (ref 6.1–8.1)

## 2020-01-14 LAB — CBC WITH DIFFERENTIAL/PLATELET
Absolute Monocytes: 814 cells/uL (ref 200–950)
Basophils Absolute: 41 cells/uL (ref 0–200)
Basophils Relative: 0.6 %
Eosinophils Absolute: 131 cells/uL (ref 15–500)
Eosinophils Relative: 1.9 %
HCT: 40.2 % (ref 35.0–45.0)
Hemoglobin: 12.8 g/dL (ref 11.7–15.5)
Lymphs Abs: 1187 cells/uL (ref 850–3900)
MCH: 28 pg (ref 27.0–33.0)
MCHC: 31.8 g/dL — ABNORMAL LOW (ref 32.0–36.0)
MCV: 88 fL (ref 80.0–100.0)
MPV: 9.5 fL (ref 7.5–12.5)
Monocytes Relative: 11.8 %
Neutro Abs: 4727 cells/uL (ref 1500–7800)
Neutrophils Relative %: 68.5 %
Platelets: 350 10*3/uL (ref 140–400)
RBC: 4.57 10*6/uL (ref 3.80–5.10)
RDW: 13.1 % (ref 11.0–15.0)
Total Lymphocyte: 17.2 %
WBC: 6.9 10*3/uL (ref 3.8–10.8)

## 2020-01-14 LAB — HEMOGLOBIN A1C
Hgb A1c MFr Bld: 6.2 % of total Hgb — ABNORMAL HIGH (ref <5.7)
Mean Plasma Glucose: 131 (calc)
eAG (mmol/L): 7.3 (calc)

## 2020-01-14 LAB — VITAMIN B12: Vitamin B-12: 1786 pg/mL — ABNORMAL HIGH (ref 200–1100)

## 2020-01-14 LAB — TSH: TSH: 2.67 mIU/L (ref 0.40–4.50)

## 2020-01-14 LAB — LACTATE DEHYDROGENASE: LDH: 151 U/L (ref 120–250)

## 2020-01-14 LAB — VITAMIN D 25 HYDROXY (VIT D DEFICIENCY, FRACTURES): Vit D, 25-Hydroxy: 49 ng/mL (ref 30–100)

## 2020-01-14 NOTE — Patient Instructions (Signed)
F/U pending results   

## 2020-01-14 NOTE — Assessment & Plan Note (Signed)
Reviewed oncology note.  She does not have any symptoms to suggest that her lymphadenopathy has converted to true lymphoma however will check WBCs and her other labs.  They are holding on imaging at this time.  Her lymph nodes have not enlarged.  She has not had any weight loss.  Appetite is good.  Blood pressure is controlled.  Her blood sugars are also controlled.  We will check thyroid also check B12 level, and vitamin D level she does have history of deficiency.  No sign of infection today on exam.  Regarding  the epigastric pain reflux symptoms this has improved with omeprazole will take this for another week or so then can go back to as needed.  She thinks that there was some things that she was eating that caused her stomach to flareup.

## 2020-01-18 ENCOUNTER — Other Ambulatory Visit: Payer: Self-pay | Admitting: Family Medicine

## 2020-02-06 ENCOUNTER — Ambulatory Visit (INDEPENDENT_AMBULATORY_CARE_PROVIDER_SITE_OTHER): Payer: Medicare PPO | Admitting: *Deleted

## 2020-02-06 ENCOUNTER — Other Ambulatory Visit: Payer: Self-pay

## 2020-02-06 DIAGNOSIS — M810 Age-related osteoporosis without current pathological fracture: Secondary | ICD-10-CM

## 2020-02-10 DIAGNOSIS — H43813 Vitreous degeneration, bilateral: Secondary | ICD-10-CM | POA: Diagnosis not present

## 2020-02-10 DIAGNOSIS — H5319 Other subjective visual disturbances: Secondary | ICD-10-CM | POA: Diagnosis not present

## 2020-02-14 ENCOUNTER — Other Ambulatory Visit: Payer: Self-pay | Admitting: Family Medicine

## 2020-02-24 DIAGNOSIS — L570 Actinic keratosis: Secondary | ICD-10-CM | POA: Diagnosis not present

## 2020-02-24 DIAGNOSIS — D225 Melanocytic nevi of trunk: Secondary | ICD-10-CM | POA: Diagnosis not present

## 2020-02-24 DIAGNOSIS — L814 Other melanin hyperpigmentation: Secondary | ICD-10-CM | POA: Diagnosis not present

## 2020-02-24 DIAGNOSIS — L821 Other seborrheic keratosis: Secondary | ICD-10-CM | POA: Diagnosis not present

## 2020-02-24 DIAGNOSIS — D2262 Melanocytic nevi of left upper limb, including shoulder: Secondary | ICD-10-CM | POA: Diagnosis not present

## 2020-02-24 DIAGNOSIS — Z85828 Personal history of other malignant neoplasm of skin: Secondary | ICD-10-CM | POA: Diagnosis not present

## 2020-02-24 DIAGNOSIS — C4441 Basal cell carcinoma of skin of scalp and neck: Secondary | ICD-10-CM | POA: Diagnosis not present

## 2020-02-24 DIAGNOSIS — D2261 Melanocytic nevi of right upper limb, including shoulder: Secondary | ICD-10-CM | POA: Diagnosis not present

## 2020-03-05 ENCOUNTER — Ambulatory Visit: Payer: Medicare PPO

## 2020-03-26 ENCOUNTER — Ambulatory Visit (INDEPENDENT_AMBULATORY_CARE_PROVIDER_SITE_OTHER): Payer: Medicare PPO | Admitting: Family Medicine

## 2020-03-26 ENCOUNTER — Other Ambulatory Visit: Payer: Self-pay

## 2020-03-26 DIAGNOSIS — Z23 Encounter for immunization: Secondary | ICD-10-CM

## 2020-03-27 DIAGNOSIS — Z23 Encounter for immunization: Secondary | ICD-10-CM | POA: Diagnosis not present

## 2020-04-03 ENCOUNTER — Other Ambulatory Visit: Payer: Self-pay | Admitting: Family Medicine

## 2020-05-04 ENCOUNTER — Other Ambulatory Visit: Payer: Self-pay | Admitting: *Deleted

## 2020-05-04 MED ORDER — OMEPRAZOLE 20 MG PO CPDR: DELAYED_RELEASE_CAPSULE | ORAL | 3 refills | Status: DC

## 2020-05-11 DIAGNOSIS — H35363 Drusen (degenerative) of macula, bilateral: Secondary | ICD-10-CM | POA: Diagnosis not present

## 2020-05-11 DIAGNOSIS — H5319 Other subjective visual disturbances: Secondary | ICD-10-CM | POA: Diagnosis not present

## 2020-05-11 DIAGNOSIS — H43813 Vitreous degeneration, bilateral: Secondary | ICD-10-CM | POA: Diagnosis not present

## 2020-05-11 DIAGNOSIS — E119 Type 2 diabetes mellitus without complications: Secondary | ICD-10-CM | POA: Diagnosis not present

## 2020-05-11 LAB — HM DIABETES EYE EXAM

## 2020-05-12 ENCOUNTER — Other Ambulatory Visit: Payer: Self-pay | Admitting: Family Medicine

## 2020-06-25 ENCOUNTER — Encounter: Payer: Self-pay | Admitting: *Deleted

## 2020-06-30 ENCOUNTER — Other Ambulatory Visit: Payer: Self-pay | Admitting: Family Medicine

## 2020-06-30 DIAGNOSIS — Z1231 Encounter for screening mammogram for malignant neoplasm of breast: Secondary | ICD-10-CM

## 2020-07-07 ENCOUNTER — Other Ambulatory Visit: Payer: Self-pay | Admitting: Family Medicine

## 2020-07-08 ENCOUNTER — Ambulatory Visit (INDEPENDENT_AMBULATORY_CARE_PROVIDER_SITE_OTHER): Payer: Medicare PPO | Admitting: Nurse Practitioner

## 2020-07-08 ENCOUNTER — Other Ambulatory Visit: Payer: Self-pay

## 2020-07-08 ENCOUNTER — Encounter: Payer: Self-pay | Admitting: Nurse Practitioner

## 2020-07-08 VITALS — BP 160/80 | HR 100 | Temp 98.5°F | Wt 156.0 lb

## 2020-07-08 DIAGNOSIS — R059 Cough, unspecified: Secondary | ICD-10-CM | POA: Diagnosis not present

## 2020-07-08 DIAGNOSIS — J069 Acute upper respiratory infection, unspecified: Secondary | ICD-10-CM | POA: Diagnosis not present

## 2020-07-08 DIAGNOSIS — J4521 Mild intermittent asthma with (acute) exacerbation: Secondary | ICD-10-CM

## 2020-07-08 MED ORDER — PREDNISONE 20 MG PO TABS: ORAL_TABLET | ORAL | 0 refills | Status: DC

## 2020-07-08 MED ORDER — PROAIR HFA 108 (90 BASE) MCG/ACT IN AERS: INHALATION_SPRAY | RESPIRATORY_TRACT | 3 refills | Status: DC

## 2020-07-08 NOTE — Progress Notes (Signed)
Subjective:    Patient ID: Cathy [name], female    DOB: 1945-11-12, 75 y.o.   MRN: [id number]  HPI  Patient is a 74 year old white female who presents today with upper respiratory infection symptoms.  I am seeing the patient on behalf of my nurse practitioner.  In terms began Thursday approximately 6 days ago.  Symptoms include rhinorrhea that the patient attributed to allergies, a low-grade fever of 99.6, cough, and chest congestion.  She has been treating the chest congestion with Mucinex and the runny nose with Zyrtec.  Unfortunately this is set off her asthma and she has been using her ProAir every 4-6 hours.  She is still wheezing this morning on exam.  Temperature remains in the low 99 range.  She denies any chest pain.  She denies any pleurisy.  Patient is coughing up yellow and white sputum.  She denies any hemoptysis.  She denies any rigors.  She denies any sore throat or sinus pain.  She is fully vaccinated for COVID but she has been exposed to Covid over the last 2 weeks. Past Medical History:  Diagnosis Date  . Abnormal glucose   . Allergy   . Asthma    Allergy induced  . Benign breast cyst in female   . GERD (gastroesophageal reflux disease)   . Hilar lymphadenopathy 08/01/2011  . History of nuclear stress test 2009   Treadmill and Stress Myoview- no CAD  . Hyperlipidemia   . Hypertension   . Lymphadenopathy of left cervical region 08/01/2011  . Nephrolithiasis 1984  . Osteopenia   . PONV (postoperative nausea and vomiting)   . Post-menopause   . Pre-diabetes    borderline  . 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 Vision changes    Past Surgical History:  Procedure Laterality Date  . CHOLECYSTECTOMY    . LITHOTRIPSY  1984  . LYMPH NODE BIOPSY    . 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   Procedure: LEFT TOTAL KNEE ARTHROPLASTY;  Surgeon: Gaynelle Arabian, MD;  Location: [facility];  Service: Orthopedics;  Laterality: Left;   Current Outpatient Medications on  File Prior to Visit  Medication Sig Dispense Refill  . aspirin EC 81 MG tablet Take 81 mg by mouth daily.    Marland Kitchen atorvastatin (LIPITOR) 20 MG tablet TAKE 1 TABLET BY MOUTH ONCE DAILY 90 tablet 3  . BREO ELLIPTA 100-25 MCG/INH AEPB INHALE 1 PUFF INTO THE LUNGS DAILY 60 each 5  . cetirizine (ZYRTEC) 10 MG tablet TAKE 1 TABLET BY MOUTH ONCE DAILY 90 tablet 2  . Cholecalciferol (VITAMIN D) 50 MCG (2000 UT) CAPS Take by mouth.    Marland Kitchen CINNAMON PO Take 500 mg by mouth daily.    Marland Kitchen co-enzyme Q-10 30 MG capsule Take 30 mg by mouth 3 (three) times daily.    Marland Kitchen denosumab (PROLIA) 60 MG/ML SOSY injection Inject 60 mg into the skin every 6 (six) months.    . hydrochlorothiazide (MICROZIDE) 12.5 MG capsule Take 1 capsule (12.5 mg total) by mouth daily. 90 capsule 3  . ibuprofen (ADVIL) 200 MG tablet Take 200 mg by mouth every 6 (six) hours as needed.    Marland Kitchen losartan (COZAAR) 100 MG tablet TAKE 1 TABLET BY MOUTH ONCE DAILY 90 tablet 2  . metoprolol succinate (TOPROL-XL) 50 MG 24 hr tablet TAKE 1 TABLET BY MOUTH ONCE A DAY WITH OR IMMEDIATELY FOLLOWING A MEAL 90 tablet 2  . Multiple Vitamin (MULTIVITAMIN) capsule Take 1 capsule  by mouth daily.    . Omega-3 Fatty Acids (FISH OIL PO) Take 1,000 mg by mouth 2 (two) times daily.     Marland Kitchen omeprazole (PRILOSEC) 20 MG capsule TAKE 1 CAPSULE BY MOUTH ONCE DAILY 90 capsule 3  . TURMERIC PO Take 1 tablet by mouth daily.    . vitamin B-12 (CYANOCOBALAMIN) 100 MCG tablet Take 100 mcg by mouth daily.    . vitamin C (ASCORBIC ACID) 500 MG tablet Take 500 mg by mouth daily.    . vitamin E 100 UNIT capsule Take by mouth daily.    . Zinc 50 MG TABS Take by mouth.    . gabapentin (NEURONTIN) 100 MG capsule TAKE 1 TO 2 CAPSULES BY MOUTH AT BEDTIME (Patient not taking: Reported on 07/08/2020) 60 capsule 1  . Glucosamine-Chondroitin (GLUCOSAMINE CHONDR COMPLEX PO) Take 400 mg by mouth daily.    Marland Kitchen glucose blood (ACCU-CHEK AVIVA PLUS) test strip Use to Monitor blood sugars daily 100 each 5   . Multiple Vitamins-Minerals (HAIR SKIN AND NAILS FORMULA) TABS Take by mouth 3 (three) times daily.    . multivitamin-lutein (OCUVITE-LUTEIN) CAPS capsule Take 1 capsule by mouth daily.     Current Facility-Administered Medications on File Prior to Visit  Medication Dose Route Frequency Provider Last Rate Last Admin  . denosumab (PROLIA) injection 60 mg  60 mg Subcutaneous Q6 months Alycia Rossetti, MD   60 mg at 02/06/20 0850   Allergies  Allergen Reactions  . Codeine     Patients states when she was young, she was told she "acted weird" after being given codeine   Social History   Socioeconomic History  . Marital status: Widowed    Spouse name: Not on file  . Number of children: Not on file  . Years of education: Not on file  . Highest education level: Not on file  Occupational History  . Not on file  Tobacco Use  . Smoking status: Never Smoker  . Smokeless tobacco: Never Used  Substance and Sexual Activity  . Alcohol use: No  . Drug use: No  . Sexual activity: Not Currently  Other Topics Concern  . Not on file  Social History Narrative  . Not on file   Social Determinants of Health   Financial Resource Strain: Not on file  Food Insecurity: Not on file  Transportation Needs: Not on file  Physical Activity: Not on file  Stress: Not on file  Social Connections: Not on file  Intimate Partner Violence: Not on file      Review of Systems  All other systems reviewed and are negative.      Objective:   Physical Exam Vitals reviewed.  Constitutional:      General: She is not in acute distress.    Appearance: Normal appearance. She is not ill-appearing or toxic-appearing.  HENT:     Right Ear: Tympanic membrane and ear canal normal.     Left Ear: Tympanic membrane and ear canal normal.     Nose: Congestion and rhinorrhea present.     Mouth/Throat:     Pharynx: Oropharynx is clear. No oropharyngeal exudate or posterior oropharyngeal erythema.  Eyes:      Conjunctiva/sclera: Conjunctivae normal.  Cardiovascular:     Rate and Rhythm: Normal rate and regular rhythm.     Heart sounds: Normal heart sounds.  Pulmonary:     Effort: Pulmonary effort is normal. No respiratory distress.     Breath sounds: Wheezing present. No rhonchi or  rales.  Neurological:     Mental Status: She is alert.           Assessment & Plan:  Cough - Plan: SARS-COV-2 RNA,(COVID-19) QUAL NAAT  Viral URI  Mild intermittent asthma with exacerbation  Patient likely had a viral upper respiratory infection.  Symptoms are mild including rhinorrhea, low-grade fever, cough.  However it has set off an asthma exacerbation.  I will treat the asthma exacerbation with a prednisone taper pack.  Patient can also use her albuterol 2 puffs every 4-6 hours as needed for wheezing.  Given her recent exposure to Covid I will also screen the patient for Covid so she will know if she needs to quarantine.  At the present time patient does not demonstrate any evidence of pneumonia or respiratory distress.

## 2020-07-10 ENCOUNTER — Other Ambulatory Visit: Payer: Self-pay | Admitting: *Deleted

## 2020-07-10 DIAGNOSIS — U071 COVID-19: Secondary | ICD-10-CM

## 2020-07-10 LAB — SARS-COV-2 RNA,(COVID-19) QUALITATIVE NAAT: SARS CoV2 RNA: DETECTED — CR

## 2020-07-10 MED ORDER — ALBUTEROL SULFATE HFA 108 (90 BASE) MCG/ACT IN AERS
2.0000 | INHALATION_SPRAY | Freq: Four times a day (QID) | RESPIRATORY_TRACT | 0 refills | Status: DC | PRN
Start: 2020-07-10 — End: 2020-09-23

## 2020-07-22 ENCOUNTER — Telehealth: Payer: Self-pay | Admitting: *Deleted

## 2020-07-22 MED ORDER — ACCU-CHEK AVIVA PLUS VI STRP: ORAL_STRIP | 5 refills | Status: DC

## 2020-07-22 NOTE — Telephone Encounter (Signed)
Received call from patient.   Reports that she requires refill on test strips.   Prescription sent to pharmacy.

## 2020-08-10 ENCOUNTER — Ambulatory Visit (INDEPENDENT_AMBULATORY_CARE_PROVIDER_SITE_OTHER): Payer: Medicare PPO | Admitting: Family Medicine

## 2020-08-10 ENCOUNTER — Other Ambulatory Visit: Payer: Self-pay

## 2020-08-10 ENCOUNTER — Encounter: Payer: Self-pay | Admitting: Family Medicine

## 2020-08-10 ENCOUNTER — Ambulatory Visit (HOSPITAL_COMMUNITY)
Admission: RE | Admit: 2020-08-10 | Discharge: 2020-08-10 | Disposition: A | Discharge status: Home / Self Care | Payer: Medicare PPO | Source: Ambulatory Visit | Attending: Family Medicine | Admitting: Family Medicine

## 2020-08-10 VITALS — BP 142/70 | HR 76 | Temp 98.6°F | Resp 14 | Ht 62.0 in | Wt 149.0 lb

## 2020-08-10 DIAGNOSIS — M810 Age-related osteoporosis without current pathological fracture: Secondary | ICD-10-CM | POA: Diagnosis not present

## 2020-08-10 DIAGNOSIS — J4540 Moderate persistent asthma, uncomplicated: Secondary | ICD-10-CM

## 2020-08-10 DIAGNOSIS — R509 Fever, unspecified: Secondary | ICD-10-CM | POA: Diagnosis not present

## 2020-08-10 DIAGNOSIS — R590 Localized enlarged lymph nodes: Secondary | ICD-10-CM | POA: Diagnosis not present

## 2020-08-10 DIAGNOSIS — R7303 Prediabetes: Secondary | ICD-10-CM

## 2020-08-10 DIAGNOSIS — I700 Atherosclerosis of aorta: Secondary | ICD-10-CM | POA: Insufficient documentation

## 2020-08-10 DIAGNOSIS — Z8616 Personal history of COVID-19: Secondary | ICD-10-CM

## 2020-08-10 DIAGNOSIS — Z0001 Encounter for general adult medical examination with abnormal findings: Secondary | ICD-10-CM | POA: Diagnosis not present

## 2020-08-10 DIAGNOSIS — I10 Essential (primary) hypertension: Secondary | ICD-10-CM | POA: Diagnosis not present

## 2020-08-10 DIAGNOSIS — K760 Fatty (change of) liver, not elsewhere classified: Secondary | ICD-10-CM

## 2020-08-10 DIAGNOSIS — R062 Wheezing: Secondary | ICD-10-CM | POA: Diagnosis not present

## 2020-08-10 DIAGNOSIS — D729 Disorder of white blood cells, unspecified: Secondary | ICD-10-CM | POA: Diagnosis not present

## 2020-08-10 DIAGNOSIS — R0602 Shortness of breath: Secondary | ICD-10-CM

## 2020-08-10 DIAGNOSIS — Z0000 Encounter for general adult medical examination without abnormal findings: Secondary | ICD-10-CM

## 2020-08-10 DIAGNOSIS — K219 Gastro-esophageal reflux disease without esophagitis: Secondary | ICD-10-CM | POA: Diagnosis not present

## 2020-08-10 DIAGNOSIS — R634 Abnormal weight loss: Secondary | ICD-10-CM

## 2020-08-10 NOTE — Progress Notes (Signed)
Subjective:   Patient presents for Medicare Annual/Subsequent preventive examination.   Pt here to f/u    [facility] had COVID-19 4 weeks ago, she is still getting low grade fever up to 100F intermittanly She has woken up with night sweats  She still having wheezing at night , but no significant cough Using aleve and mucinex  Little SOB with exertion.  no chest pain Using  albuterol more   Lymphoma- she has had a scan 2018 , has followup later this year with oncology   HLD- taking lipitor as prescribed  HTN-she is currently taking hydrochlorothiazide 12.5 mg along with losartan 100 mg once a day.  States that her blood pressure does go home after she takes her medications.  Reports he takes her medicine in the morning they may be up work-up 150s over 80s.  No dizzy spells no chest pain.  GERD-taking omeprazole   She is scheduled for mammogram    Review Past Medical/Family/Social: per EMR    Risk Factors  Current exercise habits: tries to stay active around home Dietary issues discussed: yes  Cardiac risk factors:  DM, atherosclerosis  Depression Screen  (Note: if answer to either of the following is "Yes", a more complete depression screening is indicated)  Over the past two weeks, have you felt down, depressed or hopeless? No Over the past two weeks, have you felt little interest or pleasure in doing things? No Have you lost interest or pleasure in daily life? No Do you often feel hopeless? No Do you cry easily over simple problems? No   Activities of Daily Living  In your present state of health, do you have any difficulty performing the following activities?:  Driving? No  Managing money? No  Feeding yourself? No  Getting from bed to chair? No  Climbing a flight of stairs? No  Preparing food and eating?: No  Bathing or showering? No  Getting dressed: No  Getting to the toilet? No  Using the toilet:No  Moving around from place to place: No  In the past year have you  fallen or had a near fall?:No    Hearing Difficulties: No  Do you often ask people to speak up or repeat themselves? No  Do you experience ringing or noises in your ears? No Do you have difficulty understanding soft or whispered voices? No  Do you feel that you have a problem with memory? No Do you often misplace items? No  Do you feel safe at home? Yes  Cognitive Testing  Alert? Yes Normal Appearance?Yes  Oriented to person? Yes Place? Yes  Time? Yes  Recall of three objects? Yes  Can perform simple calculations? Yes  Displays appropriate judgment?Yes  Can read the correct time from a watch face?Yes   List the Names of Other Physician/Practitioners you currently use:  Oncology, cardiology, ophthalmology, [facility]   Screening Tests / Date Colonoscopy    UTD                 Zostavax  UTD Mammogram SCHEDULED Bone DENSITY- DUE Influenza Vaccine  UTD Tetanus/tdap UTD FLU- UTD COVID-19 UTD  ROS: GEN- denies fatigue, fever, weight loss,weakness, recent illness HEENT- denies eye drainage, change in vision, nasal discharge, CVS- denies chest pain, palpitations RESP- denies SOB, cough, wheeze ABD- denies N/V, change in stools, abd pain GU- denies dysuria, hematuria, dribbling, incontinence MSK- denies joint pain, muscle aches, injury Neuro- denies headache, dizziness, syncope, seizure activity  PHYSICAL: GEN- NAD, alert and oriented x3weight loss  HEENT- PERRL, EOMI, non injected sclera, pink conjunctiva, MMM, oropharynx clear Neck- Supple, no thryomegaly, left supraclavicular LAD palpated , no carotid bruit  CVS- RRR, no murmur RESP-CTAB, no wheeze, normal WOB at rest  Psych normal affect and mood ABD-NABS,sofT,NT,ND, EXT- No edema Pulses- Radial, DP- 2+     Assessment:    Annual wellness medicare exam   Plan:    During the course of the visit the patient was educated and counseled about appropriate screening and preventive services including:  Screening  mammography  - pt has scheduled  Osteoporosis- continue vitamin D, Prolia injection given, hold calcium due to hypercalcemia  Recent COVID-10 infection with ongoing wheezing episodes, with history of asthma, and intermittent low grade fever. Obtain CXR STAT today r/u PNA. 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   Pre diabetes diet controlled, A1C 6.4% on labs reviewed  Fatty liver/ aortic atherosclerosis on lipitor 20mg , can increase to 40mg , once cancer work up is done   HTN - bp controlled at home, looks okay in office, no changes today  FULL CODE   Diet review for nutrition referral? Yes ____ Not Indicated __x__  Patient Instructions (the written plan) was given to the patient.  Medicare Attestation  I have personally reviewed:  The patient's medical and social history  Their use of alcohol, tobacco or illicit drugs  Their current medications and supplements  The patient's functional ability including ADLs,fall risks, home safety risks, cognitive, and hearing and visual impairment  Diet and physical activities  Evidence for depression or mood disorders  The patient's weight, height, BMI, and visual acuity have been recorded in the chart. I have made referrals, counseling, and provided education to the patient based on review of the above and I have provided the patient with a written personalized care plan for preventive services.

## 2020-08-10 NOTE — Patient Instructions (Addendum)
Start back vitamin D and Multivitamin  Schedule Bone Density We will call with lab results  Chest xray  F/U Jessica 4 months

## 2020-08-11 ENCOUNTER — Encounter: Payer: Self-pay | Admitting: Family Medicine

## 2020-08-11 ENCOUNTER — Other Ambulatory Visit: Payer: Self-pay | Admitting: *Deleted

## 2020-08-11 DIAGNOSIS — E559 Vitamin D deficiency, unspecified: Secondary | ICD-10-CM

## 2020-08-11 LAB — CBC WITH DIFFERENTIAL/PLATELET
Absolute Monocytes: 1077 cells/uL — ABNORMAL HIGH (ref 200–950)
Basophils Absolute: 68 cells/uL (ref 0–200)
Basophils Relative: 0.7 %
Eosinophils Absolute: 78 cells/uL (ref 15–500)
Eosinophils Relative: 0.8 %
HCT: 38.5 % (ref 35.0–45.0)
Hemoglobin: 12.4 g/dL (ref 11.7–15.5)
Lymphs Abs: 1222 cells/uL (ref 850–3900)
MCH: 26.7 pg — ABNORMAL LOW (ref 27.0–33.0)
MCHC: 32.2 g/dL (ref 32.0–36.0)
MCV: 82.8 fL (ref 80.0–100.0)
MPV: 9.4 fL (ref 7.5–12.5)
Monocytes Relative: 11.1 %
Neutro Abs: 7256 cells/uL (ref 1500–7800)
Neutrophils Relative %: 74.8 %
Platelets: 463 10*3/uL — ABNORMAL HIGH (ref 140–400)
RBC: 4.65 10*6/uL (ref 3.80–5.10)
RDW: 13.6 % (ref 11.0–15.0)
Total Lymphocyte: 12.6 %
WBC: 9.7 10*3/uL (ref 3.8–10.8)

## 2020-08-11 LAB — COMPREHENSIVE METABOLIC PANEL
AG Ratio: 1.4 (calc) (ref 1.0–2.5)
ALT: 9 U/L (ref 6–29)
AST: 15 U/L (ref 10–35)
Albumin: 4.3 g/dL (ref 3.6–5.1)
Alkaline phosphatase (APISO): 72 U/L (ref 37–153)
BUN/Creatinine Ratio: 18 (calc) (ref 6–22)
BUN: 17 mg/dL (ref 7–25)
CO2: 29 mmol/L (ref 20–32)
Calcium: 11.1 mg/dL — ABNORMAL HIGH (ref 8.6–10.4)
Chloride: 98 mmol/L (ref 98–110)
Creat: 0.95 mg/dL — ABNORMAL HIGH (ref 0.60–0.93)
Globulin: 3 g/dL (calc) (ref 1.9–3.7)
Glucose, Bld: 111 mg/dL — ABNORMAL HIGH (ref 65–99)
Potassium: 4.5 mmol/L (ref 3.5–5.3)
Sodium: 139 mmol/L (ref 135–146)
Total Bilirubin: 0.7 mg/dL (ref 0.2–1.2)
Total Protein: 7.3 g/dL (ref 6.1–8.1)

## 2020-08-11 LAB — LIPID PANEL
Cholesterol: 140 mg/dL (ref <200)
HDL: 31 mg/dL — ABNORMAL LOW (ref >=50)
LDL Cholesterol (Calc): 85 mg/dL (calc)
Non-HDL Cholesterol (Calc): 109 mg/dL (calc) (ref <130)
Total CHOL/HDL Ratio: 4.5 (calc) (ref <5.0)
Triglycerides: 147 mg/dL (ref <150)

## 2020-08-11 LAB — HEMOGLOBIN A1C
Hgb A1c MFr Bld: 6.4 % of total Hgb — ABNORMAL HIGH (ref <5.7)
Mean Plasma Glucose: 137 mg/dL
eAG (mmol/L): 7.6 mmol/L

## 2020-08-13 ENCOUNTER — Other Ambulatory Visit: Payer: Self-pay

## 2020-08-13 ENCOUNTER — Other Ambulatory Visit (HOSPITAL_COMMUNITY)
Admission: RE | Admit: 2020-08-13 | Discharge: 2020-08-13 | Disposition: A | Discharge status: Home / Self Care | Payer: Medicare PPO | Source: Ambulatory Visit | Attending: Nurse Practitioner | Admitting: Nurse Practitioner

## 2020-08-13 ENCOUNTER — Ambulatory Visit (HOSPITAL_COMMUNITY)
Admission: RE | Admit: 2020-08-13 | Discharge: 2020-08-13 | Disposition: A | Discharge status: Home / Self Care | Payer: Medicare PPO | Source: Ambulatory Visit | Attending: Family Medicine | Admitting: Family Medicine

## 2020-08-13 DIAGNOSIS — R634 Abnormal weight loss: Secondary | ICD-10-CM | POA: Insufficient documentation

## 2020-08-13 DIAGNOSIS — E559 Vitamin D deficiency, unspecified: Secondary | ICD-10-CM | POA: Insufficient documentation

## 2020-08-13 DIAGNOSIS — R590 Localized enlarged lymph nodes: Secondary | ICD-10-CM | POA: Diagnosis not present

## 2020-08-13 DIAGNOSIS — J984 Other disorders of lung: Secondary | ICD-10-CM | POA: Diagnosis not present

## 2020-08-13 DIAGNOSIS — R0602 Shortness of breath: Secondary | ICD-10-CM | POA: Insufficient documentation

## 2020-08-13 DIAGNOSIS — J9859 Other diseases of mediastinum, not elsewhere classified: Secondary | ICD-10-CM | POA: Diagnosis not present

## 2020-08-13 DIAGNOSIS — C801 Malignant (primary) neoplasm, unspecified: Secondary | ICD-10-CM | POA: Diagnosis not present

## 2020-08-13 DIAGNOSIS — R591 Generalized enlarged lymph nodes: Secondary | ICD-10-CM | POA: Diagnosis not present

## 2020-08-13 DIAGNOSIS — I2510 Atherosclerotic heart disease of native coronary artery without angina pectoris: Secondary | ICD-10-CM | POA: Diagnosis not present

## 2020-08-13 LAB — BASIC METABOLIC PANEL
Anion gap: 11 (ref 5–15)
BUN: 17 mg/dL (ref 8–23)
CO2: 25 mmol/L (ref 22–32)
Calcium: 9.8 mg/dL (ref 8.9–10.3)
Chloride: 97 mmol/L — ABNORMAL LOW (ref 98–111)
Creatinine, Ser: 1.02 mg/dL — ABNORMAL HIGH (ref 0.44–1.00)
GFR, Estimated: 58 mL/min — ABNORMAL LOW (ref >60)
Glucose, Bld: 114 mg/dL — ABNORMAL HIGH (ref 70–99)
Potassium: 3.8 mmol/L (ref 3.5–5.1)
Sodium: 133 mmol/L — ABNORMAL LOW (ref 135–145)

## 2020-08-13 LAB — VITAMIN D 25 HYDROXY (VIT D DEFICIENCY, FRACTURES): Vit D, 25-Hydroxy: 41.74 ng/mL (ref 30–100)

## 2020-08-13 MED ORDER — IOHEXOL 300 MG/ML  SOLN
100.0000 mL | Freq: Once | INTRAMUSCULAR | Status: AC | PRN
  Administered 2020-08-13: 100 mL via INTRAVENOUS

## 2020-08-14 LAB — PTH, INTACT AND CALCIUM
Calcium, Total (PTH): 10.3 mg/dL (ref 8.7–10.3)
PTH: 11 pg/mL — ABNORMAL LOW (ref 15–65)

## 2020-08-17 ENCOUNTER — Ambulatory Visit
Admission: RE | Admit: 2020-08-17 | Discharge: 2020-08-17 | Disposition: A | Discharge status: Home / Self Care | Payer: Medicare PPO | Source: Ambulatory Visit | Attending: Family Medicine | Admitting: Family Medicine

## 2020-08-17 ENCOUNTER — Telehealth: Payer: Self-pay

## 2020-08-17 ENCOUNTER — Telehealth: Payer: Self-pay | Admitting: Family Medicine

## 2020-08-17 ENCOUNTER — Other Ambulatory Visit: Payer: Self-pay

## 2020-08-17 DIAGNOSIS — Z1231 Encounter for screening mammogram for malignant neoplasm of breast: Secondary | ICD-10-CM

## 2020-08-17 NOTE — Telephone Encounter (Signed)
Called and scheduled appt with Dr. [name] for 3/15 at 1140 am, arrive at 1120. She is aware of appt time.

## 2020-08-17 NOTE — Telephone Encounter (Signed)
Patient is wanting to know if she still needs to come by next week for lab work. She states that she does have an appt with oncology.  CB# [phone]

## 2020-08-17 NOTE — Telephone Encounter (Signed)
I spoke to pt.

## 2020-08-25 ENCOUNTER — Inpatient Hospital Stay: Payer: Medicare PPO | Attending: Hematology and Oncology | Admitting: Hematology and Oncology

## 2020-08-25 ENCOUNTER — Encounter: Payer: Self-pay | Admitting: Hematology and Oncology

## 2020-08-25 ENCOUNTER — Other Ambulatory Visit: Payer: Self-pay

## 2020-08-25 VITALS — BP 161/72 | HR 104 | Temp 97.4°F | Resp 18 | Ht 62.0 in | Wt 146.2 lb

## 2020-08-25 DIAGNOSIS — Z79899 Other long term (current) drug therapy: Secondary | ICD-10-CM | POA: Insufficient documentation

## 2020-08-25 DIAGNOSIS — Z7982 Long term (current) use of aspirin: Secondary | ICD-10-CM | POA: Insufficient documentation

## 2020-08-25 DIAGNOSIS — I10 Essential (primary) hypertension: Secondary | ICD-10-CM | POA: Diagnosis not present

## 2020-08-25 DIAGNOSIS — C8598 Non-Hodgkin lymphoma, unspecified, lymph nodes of multiple sites: Secondary | ICD-10-CM

## 2020-08-25 DIAGNOSIS — C8198 Hodgkin lymphoma, unspecified, lymph nodes of multiple sites: Secondary | ICD-10-CM | POA: Insufficient documentation

## 2020-08-25 DIAGNOSIS — Z8616 Personal history of COVID-19: Secondary | ICD-10-CM | POA: Diagnosis not present

## 2020-08-25 DIAGNOSIS — I2510 Atherosclerotic heart disease of native coronary artery without angina pectoris: Secondary | ICD-10-CM | POA: Diagnosis not present

## 2020-08-25 DIAGNOSIS — I700 Atherosclerosis of aorta: Secondary | ICD-10-CM | POA: Insufficient documentation

## 2020-08-25 DIAGNOSIS — C8590 Non-Hodgkin lymphoma, unspecified, unspecified site: Secondary | ICD-10-CM | POA: Diagnosis not present

## 2020-08-25 DIAGNOSIS — R590 Localized enlarged lymph nodes: Secondary | ICD-10-CM | POA: Diagnosis not present

## 2020-08-25 DIAGNOSIS — R61 Generalized hyperhidrosis: Secondary | ICD-10-CM

## 2020-08-25 NOTE — Assessment & Plan Note (Signed)
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  I am not able to appreciate palpable lymphadenopathy on her left axilla Even though it is more desirable to avoid the neck region, I felt that the left cervical region is very assessable and have the greatest change since her last visit and likely represent a more aggressive behavior In any case, PET CT scan would be helpful to stage her disease and to guide the location of the biopsy If confirmed that the neck would be the best area to do a biopsy, I recommend ENT consult for excisional lymph node biopsy She is in agreement to proceed

## 2020-08-25 NOTE — Assessment & Plan Note (Signed)
This is likely related to her disease I noted that her creatinine is mildly elevated I recommend increase fluid hydration if possible

## 2020-08-25 NOTE — Assessment & Plan Note (Signed)
Her blood pressure is profoundly elevated but could be attributed to anxiety For now, observe closely

## 2020-08-25 NOTE — Progress Notes (Signed)
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  I am not able to appreciate palpable lymphadenopathy on her left axilla Even though it is more desirable to avoid the neck region, I felt that the left cervical region is very assessable and have the greatest change since her last visit and likely represent a more aggressive behavior In any case, PET CT scan would be helpful to stage her disease and to guide the location of the biopsy If confirmed that the neck would be the best area to do a biopsy, I recommend ENT consult for excisional lymph node biopsy She is in agreement to proceed  Night sweats This is likely related to her disease I noted that her creatinine is mildly elevated I recommend increase fluid hydration if possible  Essential hypertension Her blood pressure is profoundly elevated but could be attributed to anxiety For now, observe closely   Orders Placed This Encounter  Procedures  . NM PET Image Initial (PI) Skull Base To Thigh    Standing Status:   Future    Standing Expiration Date:   08/25/2021    Order Specific Question:   If indicated for the ordered procedure, I authorize the administration of a radiopharmaceutical per Radiology protocol    Answer:   Yes    Order Specific Question:   Preferred imaging location?    Answer:   [facility]    All questions were answered. The patient knows to call the clinic with any problems,  questions or concerns. The total time spent in the appointment was 40 minutes encounter with patients including review of chart and various tests results, discussions about plan of care and coordination of care plan   Heath Lark, MD 08/25/2020 1:16 PM  INTERVAL HISTORY: Please see below for problem oriented charting. She returns with her son for further follow-up The patient started to have drenching night sweats and weight loss since January In January, the patient was diagnosed with Covid infection She thought she was improving but still have frequent night sweats Subsequently, she was noted to have progressive weight loss and discomfort/abdominal fullness around the epigastrium region She also noticed that the neck lymphadenopathy appears to be slightly worse and underwent imaging study  On 08/13/2020, she underwent CT imaging of the chest  1. Interval progression of disease. There has been interval increase in size of right axillary, mediastinal, hilar, and upper abdominal adenopathy. 2. There is a new subpleural nodule within the paramediastinal right lower lobe measuring 1.2 x 1.3 cm. Cannot rule out malignancy. 3. Coronary artery calcifications noted. 4. Aortic atherosclerosis.  CT scan of the neck showed  Persistent lymphadenopathy, left much greater than right, with mixed changes since the prior study.  She returns today to review test results and to discuss the next step. SUMMARY OF ONCOLOGIC HISTORY:  I reviewed the patient's records extensive and collaborated the history with the patient. 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 She had an incisional biopsy on October 25 which showed atypical lymphoid hyperplasia. Biopsies were sent for second opinion. This appeared to be a T-cell process  with increased CD4 positive T lymphocytes but a firm diagnosis of lymphoma could not be established. CT scans of the neck chest abdomen and pelvis showed adenopathy in the left supraclavicular and left axillary regions and a single large right paratracheal lymph node 2 cm in diameter. No abdominal or pelvic adenopathy and no splenomegaly.  She had no constitutional symptoms. Lab testing showed previous exposure to EBV and CMV viruses with elevated IgG but not IgM titers. There was also a significant elevation of IgG against toxoplasmosis. She had no history of exposure to cats or cat litter. She was not anemic. She had a normal white blood count and differential. Serum LDH normal. ESR 4 mm.  I elected to follow her with close observation alone and serial CT scans. She has remained asymptomatic.  REVIEW OF SYSTEMS:   CEyes: Denies blurriness of vision Ears, nose, mouth, throat, and face: Denies mucositis or sore throat Respiratory: Denies cough, dyspnea or wheezes Cardiovascular: Denies palpitation, chest discomfort or lower extremity swelling Gastrointestinal:  Denies nausea, heartburn or change in bowel habits Skin: Denies abnormal skin rashes Neurological:Denies numbness, tingling or new weaknesses Behavioral/Psych: Mood is stable, no new changes  All other systems were reviewed with the patient and are negative.  I have reviewed the past medical history, past surgical history, social history and family history with the patient and they are unchanged from previous note.  ALLERGIES:  is allergic to codeine.  MEDICATIONS:  Current Outpatient Medications  Medication Sig Dispense Refill  . albuterol (VENTOLIN HFA) 108 (90 Base) MCG/ACT inhaler Inhale 2 puffs into the lungs every 6 (six) hours as needed for wheezing or shortness of breath. 8 g 0  . aspirin EC 81 MG tablet Take 81 mg by mouth daily.    Marland Kitchen atorvastatin (LIPITOR) 20 MG tablet TAKE 1 TABLET BY MOUTH ONCE DAILY 90 tablet 3  . BREO  ELLIPTA 100-25 MCG/INH AEPB INHALE 1 PUFF INTO THE LUNGS DAILY 60 each 5  . cetirizine (ZYRTEC) 10 MG tablet TAKE 1 TABLET BY MOUTH ONCE DAILY (Patient not taking: Reported on 08/10/2020) 90 tablet 2  . Cholecalciferol (VITAMIN D) 50 MCG (2000 UT) CAPS Take by mouth. (Patient not taking: Reported on 08/10/2020)    . denosumab (PROLIA) 60 MG/ML SOSY injection Inject 60 mg into the skin every 6 (six) months.    . Glucosamine-Chondroitin (GLUCOSAMINE CHONDR COMPLEX PO) Take 400 mg by mouth daily. (Patient not taking: Reported on 08/10/2020)    . glucose blood (ACCU-CHEK AVIVA PLUS) test strip Use to Monitor blood sugars daily 100 each 5  . hydrochlorothiazide (MICROZIDE) 12.5 MG capsule Take 1 capsule (12.5 mg total) by mouth daily. 90 capsule 3  . ibuprofen (ADVIL) 200 MG tablet Take 200 mg by mouth every 6 (six) hours as needed. (Patient not taking: Reported on 08/10/2020)    . losartan (COZAAR) 100 MG tablet TAKE 1 TABLET BY MOUTH ONCE DAILY 90 tablet 2  . metoprolol succinate (TOPROL-XL) 50 MG 24 hr tablet TAKE 1 TABLET BY MOUTH ONCE A DAY WITH OR IMMEDIATELY FOLLOWING A MEAL 90 tablet 2  . Multiple Vitamin (MULTIVITAMIN) capsule Take 1 capsule by mouth daily. (Patient not taking: Reported on 08/10/2020)    . Omega-3 Fatty Acids (FISH OIL PO) Take 1,000 mg by mouth 2 (  two) times daily.  (Patient not taking: Reported on 08/10/2020)    . omeprazole (PRILOSEC) 20 MG capsule TAKE 1 CAPSULE BY MOUTH ONCE DAILY 90 capsule 3  . vitamin B-12 (CYANOCOBALAMIN) 100 MCG tablet Take 100 mcg by mouth daily. (Patient not taking: Reported on 08/10/2020)     Current Facility-Administered Medications  Medication Dose Route Frequency Provider Last Rate Last Admin  . denosumab (PROLIA) injection 60 mg  60 mg Subcutaneous Q6 months [place], Kaw[place] F, MD   60 mg at 08/10/20 1000    PHYSICAL EXAMINATION: ECOG PERFORMANCE STATUS: 1 - Symptomatic but completely ambulatory  Vitals:   08/25/20 1136  BP: (!) 161/72  Pulse:  (!) 104  Resp: 18  Temp: (!) 97.4 F (36.3 C)  SpO2: 99%   Filed Weights   08/25/20 1136  Weight: 146 lb 3.2 oz (66.3 kg)    GENERAL:alert, no distress and comfortable SKIN: skin color, texture, turgor are normal, no rashes or significant lesions EYES: normal, Conjunctiva are pink and non-injected, sclera clear OROPHARYNX:no exudate, no erythema and lips, buccal mucosa, and tongue normal  NECK: supple, thyroid normal size, non-tender, without nodularity LYMPH palpable lymphadenopathy on the left cervical region, slightly enlarged and worse than before LUNGS: clear to auscultation and percussion with normal breathing effort HEART: regular rate & rhythm and no murmurs and no lower extremity edema ABDOMEN:abdomen soft, non-tender and normal bowel sounds.  I am not able to appreciate hepatosplenomegaly Musculoskeletal:no cyanosis of digits and no clubbing  NEURO: alert & oriented x 3 with fluent speech, no focal motor/sensory deficits  LABORATORY DATA:  I have reviewed the data as listed    Component Value Date/Time   NA 133 (L) 08/13/2020 1117   NA 143 08/15/2016 1037   K 3.8 08/13/2020 1117   K 4.8 08/15/2016 1037   CL 97 (L) 08/13/2020 1117   CL 103 07/16/2012 1304   CO2 25 08/13/2020 1117   CO2 27 08/15/2016 1037   GLUCOSE 114 (H) 08/13/2020 1117   GLUCOSE 111 08/15/2016 1037   GLUCOSE 112 (H) 07/16/2012 1304   BUN 17 08/13/2020 1117   BUN 12.7 08/15/2016 1037   CREATININE 1.02 (H) 08/13/2020 1117   CREATININE 0.95 (H) 08/10/2020 1041   CREATININE 0.9 08/15/2016 1037   CALCIUM 9.8 08/13/2020 1117   CALCIUM 10.3 08/13/2020 1117   CALCIUM 10.5 (H) 08/15/2016 1037   PROT 7.3 08/10/2020 1041   PROT 7.9 08/15/2016 1037   ALBUMIN 4.4 09/19/2017 0935   ALBUMIN 4.5 08/15/2016 1037   AST 15 08/10/2020 1041   AST 30 08/15/2016 1037   ALT 9 08/10/2020 1041   ALT 29 08/15/2016 1037   ALKPHOS 64 09/19/2017 0935   ALKPHOS 67 08/15/2016 1037   BILITOT 0.7 08/10/2020 1041    BILITOT 0.80 08/15/2016 1037   GFRNONAA 58 (L) 08/13/2020 1117   GFRNONAA 62 12/05/2017 1000   GFRAA 72 12/05/2017 1000    No results found for: SPEP, UPEP  Lab Results  Component Value Date   WBC 9.7 08/10/2020   NEUTROABS 7,256 08/10/2020   HGB 12.4 08/10/2020   HCT 38.5 08/10/2020   MCV 82.8 08/10/2020   PLT 463 (H) 08/10/2020      Chemistry      Component Value Date/Time   NA 133 (L) 08/13/2020 1117   NA 143 08/15/2016 1037   K 3.8 08/13/2020 1117   K 4.8 08/15/2016 1037   CL 97 (L) 08/13/2020 1117   CL 103 07/16/2012 1304  CO2 25 08/13/2020 1117   CO2 27 08/15/2016 1037   BUN 17 08/13/2020 1117   BUN 12.7 08/15/2016 1037   CREATININE 1.02 (H) 08/13/2020 1117   CREATININE 0.95 (H) 08/10/2020 1041   CREATININE 0.9 08/15/2016 1037      Component Value Date/Time   CALCIUM 9.8 08/13/2020 1117   CALCIUM 10.3 08/13/2020 1117   CALCIUM 10.5 (H) 08/15/2016 1037   ALKPHOS 64 09/19/2017 0935   ALKPHOS 67 08/15/2016 1037   AST 15 08/10/2020 1041   AST 30 08/15/2016 1037   ALT 9 08/10/2020 1041   ALT 29 08/15/2016 1037   BILITOT 0.7 08/10/2020 1041   BILITOT 0.80 08/15/2016 1037       RADIOGRAPHIC STUDIES: I have reviewed her CT imaging I have personally reviewed the radiological images as listed and agreed with the findings in the report. DG Chest 2 View  Result Date: 08/11/2020 CLINICAL DATA:  75 year old female with history of COVID diagnosed January 26th. Persistent wheezing and low-grade fevers. EXAM: CHEST - 2 VIEW COMPARISON:  No priors. FINDINGS: Lung volumes are normal. No consolidative airspace disease. No pleural effusions. No pneumothorax. No pulmonary nodule or mass noted. Pulmonary vasculature and the cardiomediastinal silhouette are within normal limits. Atherosclerotic calcifications in the thoracic aorta. IMPRESSION: 1.  No radiographic evidence of acute cardiopulmonary disease. 2. Aortic atherosclerosis. Electronically Signed   By: Vinnie Langton  M.D.   On: 08/11/2020 09:06   CT Soft Tissue Neck W Contrast  Result Date: 08/13/2020 CLINICAL DATA:  Lymphadenopathy, COVID positive 1 month prior, possible lymphoma history EXAM: CT NECK WITH CONTRAST TECHNIQUE: Multidetector CT imaging of the neck was performed using the standard protocol following the bolus administration of intravenous contrast. CONTRAST:  169mL OMNIPAQUE IOHEXOL 300 MG/ML  SOLN COMPARISON:  2018 FINDINGS: Pharynx and larynx: Unremarkable.  No mass or swelling. Salivary glands: Probable small left intraparotid lymph nodes. Otherwise unremarkable parotid and submandibular glands. Thyroid: Stable appearance. Lymph nodes: Lymphadenopathy in the infrahyoid neck, posterior triangle, and supraclavicular region, left much greater than right, again identified. A left level 5 node on series 2, image 42 measures 1 cm (previously 0.8 cm). Adjacent cluster of level 5 nodes measures 1.6 cm on image 47 (previously 2.1 cm). Left level 3 node on image 53 measures 1.7 cm (previously 0.9 cm). Right supraclavicular node on image 67 measures 1.1 cm (previously 0.6 cm). Left supraclavicular cluster on image 75 measures 1.3 cm (previously 2.1 cm). Vascular: Major neck vessels are patent. Mild calcified plaque at the common carotid bifurcations. Limited intracranial: No abnormal enhancement. Visualized orbits: Unremarkable. Mastoids and visualized paranasal sinuses: No significant opacification. Skeleton: Advanced degenerative changes of the cervical spine similar to the prior study. Upper chest: Dictated separately. Other: None. IMPRESSION: Persistent lymphadenopathy, left much greater than right, with mixed changes since the prior study. Electronically Signed   By: Macy Mis M.D.   On: 08/13/2020 12:49   CT Chest W Contrast  Result Date: 08/13/2020 CLINICAL DATA:  Cancer of unknown primary.  Follow-up imaging. EXAM: CT CHEST WITH CONTRAST TECHNIQUE: Multidetector CT imaging of the chest was performed  during intravenous contrast administration. CONTRAST:  136mL OMNIPAQUE IOHEXOL 300 MG/ML  SOLN COMPARISON:  09/12/2016 FINDINGS: Cardiovascular: The heart size is within normal limits. There is no pericardial effusion. Aortic atherosclerosis. Coronary artery calcifications. Mediastinum/Nodes: Normal appearance of the thyroid gland. The trachea appears patent and is midline. Normal appearance of the esophagus. Thoracic adenopathy is again identified, including: -left axillary lymph node measuring 1.3  cm, image 32/2. Previously 1.6 cm. -left retropectoral node measures 1 cm, image 21/2. Previously 1.6 cm -high left retropectoral lymph node measures 1.2 cm, image 17/2. Previously 1.5 cm -Pre-vascular anterior mediastinal lymph node measures 1.6 cm, image 32/2. Previously 1.1 cm -Right paratracheal nodal conglomeration measures 1.8 cm, image 52/2. Previously 0.7 cm. -Subcarinal nodal conglomeration measures 2.5 cm, image 62/2. Previously 1.1 cm. Left hilar node measures 1.8 cm, image 50/2. This is new from previous exam. Right hilar nodal mass is new measuring 2.9 x 2.0 cm, image 53/2. Inferior right hilar node measures 1.7 cm, image 81/2. Also new from previous exam. Right hilar nodes measure 1.3 cm, image 18/2. New from previous exam. Right supraclavicular node measures 9 mm, image 6/2. Previously 4 mm. -posterior mediastinal lymph node adjacent to esophagus and aorta measures 1.6 cm, image 101/2. Previously 1 cm Lungs/Pleura: Scar like density identified within the anteromedial right middle lobe. Subpleural nodule within the paramediastinal right lower lobe measures 1.2 x 1.3 cm, image 90/2. This is new compared with the previous exam. Upper Abdomen: Limited imaging through the upper abdomen shows multiple enlarged lymph nodes which appears progressive from previous exam, including: -Conglomeration of large nodes within the gastro hepatic ligament measures 5.5 x 2.8 cm, image 125/2. Previously there were several  borderline enlarged lymph nodes in the gastrohepatic ligament measuring up to 8 mm. -Left periaortic lymph node at the level of the left renal vein measures 2.6 cm, image 151/2. Previously 1.0 cm. No focal liver abnormality identified within the imaged portions of the upper abdomen. Musculoskeletal: No aggressive lytic or sclerotic bone lesions. Multilevel degenerative disc disease identified. IMPRESSION: 1. Interval progression of disease. There has been interval increase in size of right axillary, mediastinal, hilar, and upper abdominal adenopathy. 2. There is a new subpleural nodule within the paramediastinal right lower lobe measuring 1.2 x 1.3 cm. Cannot rule out malignancy. 3. Coronary artery calcifications noted. 4. Aortic atherosclerosis. Aortic Atherosclerosis (ICD10-I70.0). Electronically Signed   By: Kerby Moors M.D.   On: 08/13/2020 13:10

## 2020-08-26 ENCOUNTER — Other Ambulatory Visit: Payer: Self-pay | Admitting: Hematology and Oncology

## 2020-08-26 ENCOUNTER — Telehealth: Payer: Self-pay | Admitting: Hematology and Oncology

## 2020-08-26 DIAGNOSIS — C8598 Non-Hodgkin lymphoma, unspecified, lymph nodes of multiple sites: Secondary | ICD-10-CM

## 2020-08-26 NOTE — Telephone Encounter (Signed)
I called the patient this morning and recommend ENT referral now rather than wait for PET CT scan due to delay of getting PET scan until the end of the month She is in agreement

## 2020-08-27 ENCOUNTER — Other Ambulatory Visit: Payer: Self-pay

## 2020-08-27 ENCOUNTER — Ambulatory Visit (INDEPENDENT_AMBULATORY_CARE_PROVIDER_SITE_OTHER): Payer: Medicare PPO | Admitting: Otolaryngology

## 2020-08-27 VITALS — Temp 97.7°F

## 2020-08-27 DIAGNOSIS — R590 Localized enlarged lymph nodes: Secondary | ICD-10-CM | POA: Diagnosis not present

## 2020-08-27 NOTE — Progress Notes (Signed)
HPI: Cathy [name] is a 75 y.o. female who presents is referred by Heath Lark, MD for evaluation of progression of non-Hodgkin's lymphoma.  Patient apparently has had a long history of lymphoma that has been followed and more recently has had some progression of lymphadenopathy in her neck.  She is scheduled for a PET scan.  Dr. [name] consulted me concerning obtaining lymph node biopsy.  She had a recent CT scan of her neck that I reviewed.  She has a large lymph node in the region of the tail of the parotid on the left side.  She also has some smaller lymph nodes in the posterior lower neck which will be easier to remove with less risk to the facial nerve and parotid gland.. She is on no blood thinners except for baby aspirin.  She has no significant cardiac history.  Past Medical History:  Diagnosis Date  . Abnormal glucose   . Allergy   . Asthma    Allergy induced  . Benign breast cyst in female   . GERD (gastroesophageal reflux disease)   . Hilar lymphadenopathy 08/01/2011  . History of nuclear stress test 2009   Treadmill and Stress Myoview- no CAD  . Hyperlipidemia   . Hypertension   . Lymphadenopathy of left cervical region 08/01/2011  . Nephrolithiasis 1984  . Osteopenia   . PONV (postoperative nausea and vomiting)   . Post-menopause   . Pre-diabetes    borderline  . 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 Vision changes    Past Surgical History:  Procedure Laterality Date  . CHOLECYSTECTOMY    . LITHOTRIPSY  1984  . LYMPH NODE BIOPSY    . 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 Marital status: Widowed    Spouse name: Not on file  . Number of children: Not on file  . Years of education: Not on file  . Highest education level: Not on file  Occupational History  . Not on file   Tobacco Use  . Smoking status: Never Smoker  . Smokeless tobacco: Never Used  Substance and Sexual Activity  . Alcohol use: No  . Drug use: No  . Sexual activity: Not Currently  Other Topics Concern  . Not on file  Social History Narrative  . Not on file   Social Determinants of Health   Financial Resource Strain: Not on file  Food Insecurity: Not on file  Transportation Needs: Not on file  Physical Activity: Not on file  Stress: Not on file  Social Connections: Not on file   Family History  Problem Relation Age of Onset  . Hypertension Mother   . Stroke Mother   . Heart attack Mother        CABG, 5 Stents  . Dementia Mother   . Cancer Father        Kidney  . Diabetes Brother        Borderline  . Alcohol abuse Brother   . Asthma Sister   . Cancer Maternal Aunt        stomach ca  . Cancer Maternal Aunt        ovarian ca  . Breast cancer Cousin    Allergies  Allergen Reactions  . Codeine     Patients states when she was young,  she was told she "acted weird" after being given codeine   Prior to Admission medications   Medication Sig Start Date End Date Taking? Authorizing Provider  albuterol (VENTOLIN HFA) 108 (90 Base) MCG/ACT inhaler Inhale 2 puffs into the lungs every 6 (six) hours as needed for wheezing or shortness of breath. 07/10/20   Alycia Rossetti, MD  aspirin EC 81 MG tablet Take 81 mg by mouth daily.    [provider]  atorvastatin (LIPITOR) 20 MG tablet TAKE 1 TABLET BY MOUTH ONCE DAILY 05/12/20   [place], Modena Nunnery, MD  BREO ELLIPTA 100-25 MCG/INH AEPB INHALE 1 PUFF INTO THE LUNGS DAILY 04/03/20   Susy Frizzle, MD  cetirizine (ZYRTEC) 10 MG tablet TAKE 1 TABLET BY MOUTH ONCE DAILY Patient not taking: Reported on 08/10/2020 11/13/19   Alycia Rossetti, MD  Cholecalciferol (VITAMIN D) 50 MCG (2000 UT) CAPS Take by mouth. Patient not taking: Reported on 08/10/2020    [provider]  denosumab (PROLIA) 60 MG/ML SOSY injection  Inject 60 mg into the skin every 6 (six) months.    [provider]  Glucosamine-Chondroitin (GLUCOSAMINE CHONDR COMPLEX PO) Take 400 mg by mouth daily. Patient not taking: Reported on 08/10/2020    [provider]  glucose blood (ACCU-CHEK AVIVA PLUS) test strip Use to Monitor blood sugars daily 07/22/20   Alycia Rossetti, MD  hydrochlorothiazide (MICROZIDE) 12.5 MG capsule Take 1 capsule (12.5 mg total) by mouth daily. 09/03/19   Josue Hector, MD  ibuprofen (ADVIL) 200 MG tablet Take 200 mg by mouth every 6 (six) hours as needed. Patient not taking: Reported on 08/10/2020    [provider]  losartan (COZAAR) 100 MG tablet TAKE 1 TABLET BY MOUTH ONCE DAILY 02/14/20   Alycia Rossetti, MD  metoprolol succinate (TOPROL-XL) 50 MG 24 hr tablet TAKE 1 TABLET BY MOUTH ONCE A DAY WITH OR IMMEDIATELY FOLLOWING A MEAL 02/14/20   [place], Modena Nunnery, MD  Multiple Vitamin (MULTIVITAMIN) capsule Take 1 capsule by mouth daily. Patient not taking: Reported on 08/10/2020    [provider]  Omega-3 Fatty Acids (FISH OIL PO) Take 1,000 mg by mouth 2 (two) times daily.  Patient not taking: Reported on 08/10/2020    [provider]  omeprazole (PRILOSEC) 20 MG capsule TAKE 1 CAPSULE BY MOUTH ONCE DAILY 05/04/20   [place], Modena Nunnery, MD  vitamin B-12 (CYANOCOBALAMIN) 100 MCG tablet Take 100 mcg by mouth daily. Patient not taking: Reported on 08/10/2020    [provider]     Positive ROS: Otherwise negative  All other systems have been reviewed and were otherwise negative with the exception of those mentioned in the HPI and as above.  Physical Exam: Constitutional: Alert, well-appearing, no acute distress Ears: External ears without lesions or tenderness. Ear canals are clear bilaterally with intact, clear TMs.  Nasal: External nose without lesions. Septum with minimal deformity.. Clear nasal passages otherwise. Oral: Lips and gums without lesions. Tongue and  palate mucosa without lesions. Posterior oropharynx clear. Neck: No palpable adenopathy or masses.  Patient has a 2 cm nodule just behind the angle of the jaw in the region of the tail of the parotid gland on the left side.  She also has some small lymphadenopathy approximately 1 to 1/2 cm lower in the neck just posterior to the sternocleidomastoid muscle. Respiratory: Breathing comfortably Cardiac exam: Regular rate and rhythm without murmur Lungs: Clear to auscultation. Skin: No facial/neck lesions or rash noted.  Procedures  Assessment: Left neck lymphadenopathy in patient with history of non-Hodgkin's lymphoma  Plan: We will schedule lymph node biopsy for next week.   Radene Journey, MD   CC:

## 2020-08-28 ENCOUNTER — Other Ambulatory Visit: Payer: Self-pay

## 2020-08-28 ENCOUNTER — Encounter (HOSPITAL_BASED_OUTPATIENT_CLINIC_OR_DEPARTMENT_OTHER): Payer: Self-pay | Admitting: Otolaryngology

## 2020-08-31 ENCOUNTER — Inpatient Hospital Stay: Payer: Medicare PPO | Admitting: Hematology and Oncology

## 2020-08-31 ENCOUNTER — Ambulatory Visit (HOSPITAL_COMMUNITY): Payer: Medicare PPO

## 2020-08-31 ENCOUNTER — Other Ambulatory Visit: Payer: Self-pay

## 2020-08-31 ENCOUNTER — Encounter (HOSPITAL_BASED_OUTPATIENT_CLINIC_OR_DEPARTMENT_OTHER)
Admission: RE | Admit: 2020-08-31 | Discharge: 2020-08-31 | Disposition: A | Discharge status: Home / Self Care | Payer: Medicare PPO | Source: Ambulatory Visit | Attending: Otolaryngology | Admitting: Otolaryngology

## 2020-08-31 ENCOUNTER — Other Ambulatory Visit (HOSPITAL_COMMUNITY): Payer: Medicare PPO

## 2020-08-31 ENCOUNTER — Ambulatory Visit (INDEPENDENT_AMBULATORY_CARE_PROVIDER_SITE_OTHER): Payer: Self-pay | Admitting: Otolaryngology

## 2020-08-31 DIAGNOSIS — R590 Localized enlarged lymph nodes: Secondary | ICD-10-CM

## 2020-08-31 DIAGNOSIS — Z01818 Encounter for other preprocedural examination: Secondary | ICD-10-CM | POA: Diagnosis not present

## 2020-08-31 LAB — BASIC METABOLIC PANEL
Anion gap: 9 (ref 5–15)
BUN: 14 mg/dL (ref 8–23)
CO2: 26 mmol/L (ref 22–32)
Calcium: 9.4 mg/dL (ref 8.9–10.3)
Chloride: 100 mmol/L (ref 98–111)
Creatinine, Ser: 0.98 mg/dL (ref 0.44–1.00)
GFR, Estimated: >60 mL/min (ref >60)
Glucose, Bld: 126 mg/dL — ABNORMAL HIGH (ref 70–99)
Potassium: 4 mmol/L (ref 3.5–5.1)
Sodium: 135 mmol/L (ref 135–145)

## 2020-08-31 NOTE — H&P (Signed)
PREOPERATIVE H&P  Chief Complaint: New left neck lymphadenopathy  HPI: Cathy [name] is a 75 y.o. female who presents for evaluation of progressive left neck lymphadenopathy.  Patient is being followed by Dr. [name] for non-Hodgkin's lymphoma.  She has recently developed some new left neck lymphadenopathy and was referred to me for excisional biopsy of the left neck node.  She has a larger higher left neck node but this is adjacent to the parotid gland.  She has some lower cervical lymphadenopathy along the posterior neck along the accessory chain of lymph nodes.  I will plan on excisional biopsy of one of the lower left neck nodes.  Past Medical History:  Diagnosis Date  . Abnormal glucose   . Allergy   . Asthma    Allergy induced  . Benign breast cyst in female   . GERD (gastroesophageal reflux disease)   . Hilar lymphadenopathy 08/01/2011  . History of nuclear stress test 2009   Treadmill and Stress Myoview- no CAD  . Hyperlipidemia   . Hypertension   . Lymphadenopathy of left cervical region 08/01/2011  . Nephrolithiasis 1984  . Osteopenia   . PONV (postoperative nausea and vomiting)   . Post-menopause   . Pre-diabetes    borderline  . 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 Vision changes    Past Surgical History:  Procedure Laterality Date  . CHOLECYSTECTOMY    . LITHOTRIPSY  1984  . LYMPH NODE BIOPSY    . 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 Marital status: Widowed    Spouse name: Not on file  . Number of children: Not on file  . Years of education: Not on file  . Highest education level: Not on file  Occupational History  . Not on file  Tobacco Use  . Smoking status: Never Smoker  . Smokeless tobacco: Never Used  Substance and Sexual Activity  . Alcohol use: No  .  Drug use: No  . Sexual activity: Not Currently    Birth control/protection: Post-menopausal  Other Topics Concern  . Not on file  Social History Narrative  . Not on file   Social Determinants of Health   Financial Resource Strain: Not on file  Food Insecurity: Not on file  Transportation Needs: Not on file  Physical Activity: Not on file  Stress: Not on file  Social Connections: Not on file   Family History  Problem Relation Age of Onset  . Hypertension Mother   . Stroke Mother   . Heart attack Mother        CABG, 5 Stents  . Dementia Mother   . Cancer Father        Kidney  . Diabetes Brother        Borderline  . Alcohol abuse Brother   . Asthma Sister   . Cancer Maternal Aunt        stomach ca  . Cancer Maternal Aunt        ovarian ca  . Breast cancer Cousin    Allergies  Allergen Reactions  . Codeine     Patients states when she was young, she was told she "acted weird" after being given codeine   Prior to Admission medications   Medication Sig Start Date End Date Taking? Authorizing Provider  albuterol (  VENTOLIN HFA) 108 (90 Base) MCG/ACT inhaler Inhale 2 puffs into the lungs every 6 (six) hours as needed for wheezing or shortness of breath. 07/10/20   Alycia Rossetti, MD  aspirin EC 81 MG tablet Take 81 mg by mouth daily.    [provider]  atorvastatin (LIPITOR) 20 MG tablet TAKE 1 TABLET BY MOUTH ONCE DAILY 05/12/20   [place], Modena Nunnery, MD  BREO ELLIPTA 100-25 MCG/INH AEPB INHALE 1 PUFF INTO THE LUNGS DAILY 04/03/20   Susy Frizzle, MD  cetirizine (ZYRTEC) 10 MG tablet TAKE 1 TABLET BY MOUTH ONCE DAILY 11/13/19   Alycia Rossetti, MD  Cholecalciferol (VITAMIN D) 50 MCG (2000 UT) CAPS Take by mouth.    [provider]  denosumab (PROLIA) 60 MG/ML SOSY injection Inject 60 mg into the skin every 6 (six) months.    [provider]  Glucosamine-Chondroitin (GLUCOSAMINE CHONDR COMPLEX PO) Take 400 mg by mouth daily.    [provider]  glucose blood (ACCU-CHEK AVIVA PLUS) test strip Use to Monitor blood sugars daily 07/22/20   Alycia Rossetti, MD  hydrochlorothiazide (MICROZIDE) 12.5 MG capsule Take 1 capsule (12.5 mg total) by mouth daily. 09/03/19   Josue Hector, MD  losartan (COZAAR) 100 MG tablet TAKE 1 TABLET BY MOUTH ONCE DAILY 02/14/20   Alycia Rossetti, MD  metoprolol succinate (TOPROL-XL) 50 MG 24 hr tablet TAKE 1 TABLET BY MOUTH ONCE A DAY WITH OR IMMEDIATELY FOLLOWING A MEAL 02/14/20   [place], Modena Nunnery, MD  Multiple Vitamin (MULTIVITAMIN) capsule Take 1 capsule by mouth daily.    [provider]  Omega-3 Fatty Acids (FISH OIL PO) Take 1,000 mg by mouth 2 (two) times daily.    [provider]  omeprazole (PRILOSEC) 20 MG capsule TAKE 1 CAPSULE BY MOUTH ONCE DAILY 05/04/20   [place], Modena Nunnery, MD  vitamin B-12 (CYANOCOBALAMIN) 100 MCG tablet Take 100 mcg by mouth daily.    [provider]     Positive ROS: Otherwise negative  All other systems have been reviewed and were otherwise negative with the exception of those mentioned in the HPI and as above.  Physical Exam: There were no vitals filed for this visit.  General: Alert, no acute distress Oral: Normal oral mucosa and tonsils Nasal: Clear nasal passages Neck: No palpable adenopathy or thyroid nodules.  Patient has a larger node approximately 2 cm in the region of the tail of the parotid gland.  There are some small lymph nodes about 1-1 and half centimeters lower in the left neck just posterior to the sternocleidomastoid muscle. Ear: Ear canal is clear with normal appearing TMs Cardiovascular: Regular rate and rhythm, no murmur.  Respiratory: Clear to auscultation Neurologic: Alert and oriented x 3   Assessment/Plan: New left neck lymphadenopathy in patient with history of non-Hodgkin's lymphoma  Plan for excisional biopsy of left neck node.   Melony Overly, MD 08/31/2020 12:49 PM

## 2020-08-31 NOTE — H&P (View-Only) (Signed)
PREOPERATIVE H&P  Chief Complaint: New left neck lymphadenopathy  HPI: Cathy [name] is a 75 y.o. female who presents for evaluation of progressive left neck lymphadenopathy.  Patient is being followed by Dr. [name] for non-Hodgkin's lymphoma.  She has recently developed some new left neck lymphadenopathy and was referred to me for excisional biopsy of the left neck node.  She has a larger higher left neck node but this is adjacent to the parotid gland.  She has some lower cervical lymphadenopathy along the posterior neck along the accessory chain of lymph nodes.  I will plan on excisional biopsy of one of the lower left neck nodes.  Past Medical History:  Diagnosis Date  . Abnormal glucose   . Allergy   . Asthma    Allergy induced  . Benign breast cyst in female   . GERD (gastroesophageal reflux disease)   . Hilar lymphadenopathy 08/01/2011  . History of nuclear stress test 2009   Treadmill and Stress Myoview- no CAD  . Hyperlipidemia   . Hypertension   . Lymphadenopathy of left cervical region 08/01/2011  . Nephrolithiasis 1984  . Osteopenia   . PONV (postoperative nausea and vomiting)   . Post-menopause   . Pre-diabetes    borderline  . 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 Vision changes    Past Surgical History:  Procedure Laterality Date  . CHOLECYSTECTOMY    . LITHOTRIPSY  1984  . LYMPH NODE BIOPSY    . 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 Marital status: Widowed    Spouse name: Not on file  . Number of children: Not on file  . Years of education: Not on file  . Highest education level: Not on file  Occupational History  . Not on file  Tobacco Use  . Smoking status: Never Smoker  . Smokeless tobacco: Never Used  Substance and Sexual Activity  . Alcohol use: No  .  Drug use: No  . Sexual activity: Not Currently    Birth control/protection: Post-menopausal  Other Topics Concern  . Not on file  Social History Narrative  . Not on file   Social Determinants of Health   Financial Resource Strain: Not on file  Food Insecurity: Not on file  Transportation Needs: Not on file  Physical Activity: Not on file  Stress: Not on file  Social Connections: Not on file   Family History  Problem Relation Age of Onset  . Hypertension Mother   . Stroke Mother   . Heart attack Mother        CABG, 5 Stents  . Dementia Mother   . Cancer Father        Kidney  . Diabetes Brother        Borderline  . Alcohol abuse Brother   . Asthma Sister   . Cancer Maternal Aunt        stomach ca  . Cancer Maternal Aunt        ovarian ca  . Breast cancer Cousin    Allergies  Allergen Reactions  . Codeine     Patients states when she was young, she was told she "acted weird" after being given codeine   Prior to Admission medications   Medication Sig Start Date End Date Taking? Authorizing Provider  albuterol (  VENTOLIN HFA) 108 (90 Base) MCG/ACT inhaler Inhale 2 puffs into the lungs every 6 (six) hours as needed for wheezing or shortness of breath. 07/10/20   Alycia Rossetti, MD  aspirin EC 81 MG tablet Take 81 mg by mouth daily.    [provider]  atorvastatin (LIPITOR) 20 MG tablet TAKE 1 TABLET BY MOUTH ONCE DAILY 05/12/20   [place], Modena Nunnery, MD  BREO ELLIPTA 100-25 MCG/INH AEPB INHALE 1 PUFF INTO THE LUNGS DAILY 04/03/20   Susy Frizzle, MD  cetirizine (ZYRTEC) 10 MG tablet TAKE 1 TABLET BY MOUTH ONCE DAILY 11/13/19   Alycia Rossetti, MD  Cholecalciferol (VITAMIN D) 50 MCG (2000 UT) CAPS Take by mouth.    [provider]  denosumab (PROLIA) 60 MG/ML SOSY injection Inject 60 mg into the skin every 6 (six) months.    [provider]  Glucosamine-Chondroitin (GLUCOSAMINE CHONDR COMPLEX PO) Take 400 mg by mouth daily.    [provider]  glucose blood (ACCU-CHEK AVIVA PLUS) test strip Use to Monitor blood sugars daily 07/22/20   Alycia Rossetti, MD  hydrochlorothiazide (MICROZIDE) 12.5 MG capsule Take 1 capsule (12.5 mg total) by mouth daily. 09/03/19   Josue Hector, MD  losartan (COZAAR) 100 MG tablet TAKE 1 TABLET BY MOUTH ONCE DAILY 02/14/20   Alycia Rossetti, MD  metoprolol succinate (TOPROL-XL) 50 MG 24 hr tablet TAKE 1 TABLET BY MOUTH ONCE A DAY WITH OR IMMEDIATELY FOLLOWING A MEAL 02/14/20   [place], Modena Nunnery, MD  Multiple Vitamin (MULTIVITAMIN) capsule Take 1 capsule by mouth daily.    [provider]  Omega-3 Fatty Acids (FISH OIL PO) Take 1,000 mg by mouth 2 (two) times daily.    [provider]  omeprazole (PRILOSEC) 20 MG capsule TAKE 1 CAPSULE BY MOUTH ONCE DAILY 05/04/20   [place], Modena Nunnery, MD  vitamin B-12 (CYANOCOBALAMIN) 100 MCG tablet Take 100 mcg by mouth daily.    [provider]     Positive ROS: Otherwise negative  All other systems have been reviewed and were otherwise negative with the exception of those mentioned in the HPI and as above.  Physical Exam: There were no vitals filed for this visit.  General: Alert, no acute distress Oral: Normal oral mucosa and tonsils Nasal: Clear nasal passages Neck: No palpable adenopathy or thyroid nodules.  Patient has a larger node approximately 2 cm in the region of the tail of the parotid gland.  There are some small lymph nodes about 1-1 and half centimeters lower in the left neck just posterior to the sternocleidomastoid muscle. Ear: Ear canal is clear with normal appearing TMs Cardiovascular: Regular rate and rhythm, no murmur.  Respiratory: Clear to auscultation Neurologic: Alert and oriented x 3   Assessment/Plan: New left neck lymphadenopathy in patient with history of non-Hodgkin's lymphoma  Plan for excisional biopsy of left neck node.   Melony Overly, MD 08/31/2020 12:49 PM

## 2020-09-01 ENCOUNTER — Ambulatory Visit
Admission: RE | Admit: 2020-09-01 | Discharge: 2020-09-01 | Disposition: A | Discharge status: Home / Self Care | Payer: Medicare PPO | Source: Ambulatory Visit | Attending: Hematology and Oncology | Admitting: Hematology and Oncology

## 2020-09-01 ENCOUNTER — Other Ambulatory Visit: Payer: Self-pay

## 2020-09-01 DIAGNOSIS — C8598 Non-Hodgkin lymphoma, unspecified, lymph nodes of multiple sites: Secondary | ICD-10-CM | POA: Diagnosis not present

## 2020-09-01 DIAGNOSIS — Z01818 Encounter for other preprocedural examination: Secondary | ICD-10-CM | POA: Diagnosis not present

## 2020-09-01 DIAGNOSIS — R590 Localized enlarged lymph nodes: Secondary | ICD-10-CM

## 2020-09-01 LAB — GLUCOSE, CAPILLARY: Glucose-Capillary: 141 mg/dL — ABNORMAL HIGH (ref 70–99)

## 2020-09-01 MED ORDER — FLUDEOXYGLUCOSE F - 18 (FDG) INJECTION
7.6000 | Freq: Once | INTRAVENOUS | Status: AC | PRN
  Administered 2020-09-01: 8.06 via INTRAVENOUS

## 2020-09-02 ENCOUNTER — Encounter: Payer: Self-pay | Admitting: Hematology and Oncology

## 2020-09-02 ENCOUNTER — Inpatient Hospital Stay: Payer: Medicare PPO | Admitting: Hematology and Oncology

## 2020-09-02 DIAGNOSIS — I10 Essential (primary) hypertension: Secondary | ICD-10-CM | POA: Diagnosis not present

## 2020-09-02 DIAGNOSIS — C8598 Non-Hodgkin lymphoma, unspecified, lymph nodes of multiple sites: Secondary | ICD-10-CM

## 2020-09-02 DIAGNOSIS — I700 Atherosclerosis of aorta: Secondary | ICD-10-CM | POA: Diagnosis not present

## 2020-09-02 DIAGNOSIS — C8590 Non-Hodgkin lymphoma, unspecified, unspecified site: Secondary | ICD-10-CM | POA: Diagnosis not present

## 2020-09-02 DIAGNOSIS — Z7982 Long term (current) use of aspirin: Secondary | ICD-10-CM | POA: Diagnosis not present

## 2020-09-02 DIAGNOSIS — I2510 Atherosclerotic heart disease of native coronary artery without angina pectoris: Secondary | ICD-10-CM | POA: Diagnosis not present

## 2020-09-02 DIAGNOSIS — Z8616 Personal history of COVID-19: Secondary | ICD-10-CM | POA: Diagnosis not present

## 2020-09-02 DIAGNOSIS — R61 Generalized hyperhidrosis: Secondary | ICD-10-CM | POA: Diagnosis not present

## 2020-09-02 DIAGNOSIS — Z79899 Other long term (current) drug therapy: Secondary | ICD-10-CM | POA: Diagnosis not present

## 2020-09-02 NOTE — Progress Notes (Signed)
[place] OFFICE PROGRESS NOTE  Patient Care Team: Eulogio Bear, NP as PCP - General (Nurse Practitioner) Josue Hector, MD as PCP - Cardiology (Cardiology) Heath Lark, MD as Consulting Physician (Hematology and Oncology)  ASSESSMENT & PLAN:  Non-Hodgkin lymphoma [facility]) I have reviewed PET CT scan with the patient I agree with Dr. 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   All questions were answered. The patient knows to call the clinic with any problems, questions or concerns. The total time spent in the appointment was 20 minutes encounter with patients including review of chart and various tests results, discussions about plan of care and coordination of care plan   Heath Lark, MD 09/02/2020 9:54 AM  INTERVAL HISTORY: Please see below for problem oriented charting. She returns today to review her PET CT scan results She is scheduled for excisional lymph node biopsy tomorrow  SUMMARY OF ONCOLOGIC HISTORY: Oncology History  Non-Hodgkin lymphoma ([place])  08/25/2020 Initial Diagnosis   Non-Hodgkin lymphoma ([place])   08/25/2020 Cancer Staging   Staging form: Hodgkin and Non-Hodgkin Lymphoma, AJCC 8th Edition - Clinical stage from 08/25/2020: Stage IV (Unknown) - Signed by Heath Lark, MD on 09/02/2020 Stage prefix: Initial diagnosis   09/01/2020 PET scan   1. Extensive hypermetabolic lymphadenopathy involving the neck, chest and abdomen as detailed above ([place] 5). I do not see any pelvic disease. 2. Possible  involvement of the spleen. 3. Osseous involvement is also noted.       REVIEW OF SYSTEMS:   Constitutional: Denies fevers, chills or abnormal weight loss Eyes: Denies blurriness of vision Ears, nose, mouth, throat, and face: Denies mucositis or sore throat Respiratory: Denies cough, dyspnea or wheezes Cardiovascular: Denies palpitation, chest discomfort or lower extremity swelling Gastrointestinal:  Denies nausea, heartburn or change in bowel habits Skin: Denies abnormal skin rashes Lymphatics: Denies new lymphadenopathy or easy bruising Neurological:Denies numbness, tingling or new weaknesses Behavioral/Psych: Mood is stable, no new changes  All other systems were reviewed with the patient and are negative.  I have reviewed the past medical history, past surgical history, social history and family history with the patient and they are unchanged from previous note.  ALLERGIES:  is allergic to codeine.  MEDICATIONS:  Current Outpatient Medications  Medication Sig Dispense Refill  . albuterol (VENTOLIN HFA) 108 (90 Base) MCG/ACT inhaler Inhale 2 puffs into the lungs every 6 (six) hours as needed for wheezing or shortness of breath. 8 g 0  . aspirin EC 81 MG tablet Take 81 mg by mouth daily.    Marland Kitchen atorvastatin (LIPITOR) 20 MG tablet TAKE 1 TABLET BY MOUTH ONCE DAILY 90 tablet 3  . BREO ELLIPTA 100-25 MCG/INH AEPB INHALE 1 PUFF INTO THE LUNGS DAILY 60 each 5  . cetirizine (ZYRTEC) 10 MG tablet TAKE 1 TABLET BY MOUTH ONCE DAILY 90 tablet 2  . Cholecalciferol (VITAMIN D) 50 MCG (2000 UT) CAPS Take by mouth.    . denosumab (PROLIA) 60 MG/ML SOSY injection Inject 60 mg into the skin every 6 (six)  months.    . Glucosamine-Chondroitin (GLUCOSAMINE CHONDR COMPLEX PO) Take 400 mg by mouth daily.    Marland Kitchen glucose blood (ACCU-CHEK AVIVA PLUS) test strip Use to Monitor blood sugars daily 100 each 5  . hydrochlorothiazide (MICROZIDE) 12.5 MG capsule Take 1 capsule (12.5 mg total) by mouth daily. 90  capsule 3  . losartan (COZAAR) 100 MG tablet TAKE 1 TABLET BY MOUTH ONCE DAILY 90 tablet 2  . metoprolol succinate (TOPROL-XL) 50 MG 24 hr tablet TAKE 1 TABLET BY MOUTH ONCE A DAY WITH OR IMMEDIATELY FOLLOWING A MEAL 90 tablet 2  . Multiple Vitamin (MULTIVITAMIN) capsule Take 1 capsule by mouth daily.    . Omega-3 Fatty Acids (FISH OIL PO) Take 1,000 mg by mouth 2 (two) times daily.    Marland Kitchen omeprazole (PRILOSEC) 20 MG capsule TAKE 1 CAPSULE BY MOUTH ONCE DAILY 90 capsule 3  . vitamin B-12 (CYANOCOBALAMIN) 100 MCG tablet Take 100 mcg by mouth daily.     Current Facility-Administered Medications  Medication Dose Route Frequency Provider Last Rate Last Admin  . denosumab (PROLIA) injection 60 mg  60 mg Subcutaneous Q6 months Vic Blackbird F, MD   60 mg at 08/10/20 1000    PHYSICAL EXAMINATION: ECOG PERFORMANCE STATUS: 1 - Symptomatic but completely ambulatory  Vitals:   09/02/20 0934  BP: 139/61  Pulse: 88  Resp: 20  Temp: 98.9 F (37.2 C)   Filed Weights   09/02/20 0934  Weight: 147 lb 8 oz (66.9 kg)    GENERAL:alert, no distress and comfortable NEURO: alert & oriented x 3 with fluent speech, no focal motor/sensory deficits  LABORATORY DATA:  I have reviewed the data as listed    Component Value Date/Time   NA 135 08/31/2020 1030   NA 143 08/15/2016 1037   K 4.0 08/31/2020 1030   K 4.8 08/15/2016 1037   CL 100 08/31/2020 1030   CL 103 07/16/2012 1304   CO2 26 08/31/2020 1030   CO2 27 08/15/2016 1037   GLUCOSE 126 (H) 08/31/2020 1030   GLUCOSE 111 08/15/2016 1037   GLUCOSE 112 (H) 07/16/2012 1304   BUN 14 08/31/2020 1030   BUN 12.7 08/15/2016 1037   CREATININE 0.98 08/31/2020 1030   CREATININE 0.95 (H) 08/10/2020 1041   CREATININE 0.9 08/15/2016 1037   CALCIUM 9.4 08/31/2020 1030   CALCIUM 10.3 08/13/2020 1117   CALCIUM 10.5 (H) 08/15/2016 1037   PROT 7.3 08/10/2020 1041   PROT 7.9 08/15/2016 1037   ALBUMIN 4.4 09/19/2017 0935   ALBUMIN 4.5 08/15/2016 1037   AST  15 08/10/2020 1041   AST 30 08/15/2016 1037   ALT 9 08/10/2020 1041   ALT 29 08/15/2016 1037   ALKPHOS 64 09/19/2017 0935   ALKPHOS 67 08/15/2016 1037   BILITOT 0.7 08/10/2020 1041   BILITOT 0.80 08/15/2016 1037   GFRNONAA >60 08/31/2020 1030   GFRNONAA 62 12/05/2017 1000   GFRAA 72 12/05/2017 1000    No results found for: SPEP, UPEP  Lab Results  Component Value Date   WBC 9.7 08/10/2020   NEUTROABS 7,256 08/10/2020   HGB 12.4 08/10/2020   HCT 38.5 08/10/2020   MCV 82.8 08/10/2020   PLT 463 (H) 08/10/2020      Chemistry      Component Value Date/Time   NA 135 08/31/2020 1030   NA 143 08/15/2016 1037   K 4.0 08/31/2020 1030   K 4.8 08/15/2016 1037   CL 100 08/31/2020 1030   CL 103 07/16/2012 1304  CO2 26 08/31/2020 1030   CO2 27 08/15/2016 1037   BUN 14 08/31/2020 1030   BUN 12.7 08/15/2016 1037   CREATININE 0.98 08/31/2020 1030   CREATININE 0.95 (H) 08/10/2020 1041   CREATININE 0.9 08/15/2016 1037      Component Value Date/Time   CALCIUM 9.4 08/31/2020 1030   CALCIUM 10.3 08/13/2020 1117   CALCIUM 10.5 (H) 08/15/2016 1037   ALKPHOS 64 09/19/2017 0935   ALKPHOS 67 08/15/2016 1037   AST 15 08/10/2020 1041   AST 30 08/15/2016 1037   ALT 9 08/10/2020 1041   ALT 29 08/15/2016 1037   BILITOT 0.7 08/10/2020 1041   BILITOT 0.80 08/15/2016 1037       RADIOGRAPHIC STUDIES: I have reviewed PET CT scan with the patient I have personally reviewed the radiological images as listed and agreed with the findings in the report. DG Chest 2 View  Result Date: 08/11/2020 CLINICAL DATA:  75 year old female with history of COVID diagnosed January 26th. Persistent wheezing and low-grade fevers. EXAM: CHEST - 2 VIEW COMPARISON:  No priors. FINDINGS: Lung volumes are normal. No consolidative airspace disease. No pleural effusions. No pneumothorax. No pulmonary nodule or mass noted. Pulmonary vasculature and the cardiomediastinal silhouette are within normal limits.  Atherosclerotic calcifications in the thoracic aorta. IMPRESSION: 1.  No radiographic evidence of acute cardiopulmonary disease. 2. Aortic atherosclerosis. Electronically Signed   By: Vinnie Langton M.D.   On: 08/11/2020 09:06   CT Soft Tissue Neck W Contrast  Result Date: 08/13/2020 CLINICAL DATA:  Lymphadenopathy, COVID positive 1 month prior, possible lymphoma history EXAM: CT NECK WITH CONTRAST TECHNIQUE: Multidetector CT imaging of the neck was performed using the standard protocol following the bolus administration of intravenous contrast. CONTRAST:  137mL OMNIPAQUE IOHEXOL 300 MG/ML  SOLN COMPARISON:  2018 FINDINGS: Pharynx and larynx: Unremarkable.  No mass or swelling. Salivary glands: Probable small left intraparotid lymph nodes. Otherwise unremarkable parotid and submandibular glands. Thyroid: Stable appearance. Lymph nodes: Lymphadenopathy in the infrahyoid neck, posterior triangle, and supraclavicular region, left much greater than right, again identified. A left level 5 node on series 2, image 42 measures 1 cm (previously 0.8 cm). Adjacent cluster of level 5 nodes measures 1.6 cm on image 47 (previously 2.1 cm). Left level 3 node on image 53 measures 1.7 cm (previously 0.9 cm). Right supraclavicular node on image 67 measures 1.1 cm (previously 0.6 cm). Left supraclavicular cluster on image 75 measures 1.3 cm (previously 2.1 cm). Vascular: Major neck vessels are patent. Mild calcified plaque at the common carotid bifurcations. Limited intracranial: No abnormal enhancement. Visualized orbits: Unremarkable. Mastoids and visualized paranasal sinuses: No significant opacification. Skeleton: Advanced degenerative changes of the cervical spine similar to the prior study. Upper chest: Dictated separately. Other: None. IMPRESSION: Persistent lymphadenopathy, left much greater than right, with mixed changes since the prior study. Electronically Signed   By: Macy Mis M.D.   On: 08/13/2020 12:49   CT  Chest W Contrast  Result Date: 08/13/2020 CLINICAL DATA:  Cancer of unknown primary.  Follow-up imaging. EXAM: CT CHEST WITH CONTRAST TECHNIQUE: Multidetector CT imaging of the chest was performed during intravenous contrast administration. CONTRAST:  172mL OMNIPAQUE IOHEXOL 300 MG/ML  SOLN COMPARISON:  09/12/2016 FINDINGS: Cardiovascular: The heart size is within normal limits. There is no pericardial effusion. Aortic atherosclerosis. Coronary artery calcifications. Mediastinum/Nodes: Normal appearance of the thyroid gland. The trachea appears patent and is midline. Normal appearance of the esophagus. Thoracic adenopathy is again identified, including: -left axillary lymph node  measuring 1.3 cm, image 32/2. Previously 1.6 cm. -left retropectoral node measures 1 cm, image 21/2. Previously 1.6 cm -high left retropectoral lymph node measures 1.2 cm, image 17/2. Previously 1.5 cm -Pre-vascular anterior mediastinal lymph node measures 1.6 cm, image 32/2. Previously 1.1 cm -Right paratracheal nodal conglomeration measures 1.8 cm, image 52/2. Previously 0.7 cm. -Subcarinal nodal conglomeration measures 2.5 cm, image 62/2. Previously 1.1 cm. Left hilar node measures 1.8 cm, image 50/2. This is new from previous exam. Right hilar nodal mass is new measuring 2.9 x 2.0 cm, image 53/2. Inferior right hilar node measures 1.7 cm, image 81/2. Also new from previous exam. Right hilar nodes measure 1.3 cm, image 18/2. New from previous exam. Right supraclavicular node measures 9 mm, image 6/2. Previously 4 mm. -posterior mediastinal lymph node adjacent to esophagus and aorta measures 1.6 cm, image 101/2. Previously 1 cm Lungs/Pleura: Scar like density identified within the anteromedial right middle lobe. Subpleural nodule within the paramediastinal right lower lobe measures 1.2 x 1.3 cm, image 90/2. This is new compared with the previous exam. Upper Abdomen: Limited imaging through the upper abdomen shows multiple enlarged lymph  nodes which appears progressive from previous exam, including: -Conglomeration of large nodes within the gastro hepatic ligament measures 5.5 x 2.8 cm, image 125/2. Previously there were several borderline enlarged lymph nodes in the gastrohepatic ligament measuring up to 8 mm. -Left periaortic lymph node at the level of the left renal vein measures 2.6 cm, image 151/2. Previously 1.0 cm. No focal liver abnormality identified within the imaged portions of the upper abdomen. Musculoskeletal: No aggressive lytic or sclerotic bone lesions. Multilevel degenerative disc disease identified. IMPRESSION: 1. Interval progression of disease. There has been interval increase in size of right axillary, mediastinal, hilar, and upper abdominal adenopathy. 2. There is a new subpleural nodule within the paramediastinal right lower lobe measuring 1.2 x 1.3 cm. Cannot rule out malignancy. 3. Coronary artery calcifications noted. 4. Aortic atherosclerosis. Aortic Atherosclerosis (ICD10-I70.0). Electronically Signed   By: Kerby Moors M.D.   On: 08/13/2020 13:10   NM PET Image Initial (PI) Skull Base To Thigh  Result Date: 09/01/2020 CLINICAL DATA:  Initial treatment strategy for lymphadenopathy. EXAM: NUCLEAR MEDICINE PET SKULL BASE TO THIGH TECHNIQUE: 8.06 mCi F-18 FDG was injected intravenously. Full-ring PET imaging was performed from the skull base to thigh after the radiotracer. CT data was obtained and used for attenuation correction and anatomic localization. Fasting blood glucose: 141 mg/dl COMPARISON:  Neck and chest CT 08/13/2020 FINDINGS: Mediastinal blood pool activity: SUV max 1.59 Liver activity: SUV max 2.66 NECK: Bilateral hypermetabolic neck adenopathy as demonstrated on the CT scan. Enlarged lymph nodes an or adjacent to the posterior aspect of the left parotid gland are hypermetabolic with SUV max of 4.19. 9 mm right level 2 lymph node has an SUV max of 5.86. 17 mm left-sided node just deep to the  sternocleidomastoid muscle on image 42/4 has an SUV max of 9.80. Multiple other hypermetabolic neck nodes are noted bilaterally. Incidental CT findings: none CHEST: Extensive hypermetabolic adenopathy the involving the chest. There are bilateral supraclavicular and axillary lymph nodes and extensive mediastinal and hilar adenopathy. Left axillary node has an SUV max of 14.80. Subcarinal nodal mass has an SUV max of 13.39 Right hilar/mediastinal nodal mass has an SUV max of 12.60. No worrisome pulmonary nodules. No breast masses are identified. Incidental CT findings: none ABDOMEN/PELVIS: Extensive hypermetabolic adenopathy the the abdomen. Periportal nodal mass has an SUV max of 9.79. Retroperitoneal  nodal mass has an SUV max of 10.20 No pelvic or inguinal adenopathy. The spleen is normal in size. Small focus of hypermetabolism in the spleen could be a splenic lesion. SUV max is 3.92. Incidental CT findings: Vascular calcifications are noted. No aneurysm. There is a benign-appearing 4 cm cystic lesion in the left pelvis. No hypermetabolism. This is likely a benign peritoneal inclusion cyst. A postoperative seroma is also possible. SKELETON: There is a hypermetabolic lesion involving the right lower lobe which has an SUV max of 4.59. I do not see an actual pulmonary nodule or pleural lesion. It may be misregistration from a right rib lesion. There is also a E small hypermetabolic lesion in the left seventh rib posteriorly with SUV max of 3.72. There is also a lesion in the subtrochanteric right femur worrisome for lymphomatous involvement. The SUV max is 6.75 Incidental CT findings: none IMPRESSION: 1. Extensive hypermetabolic lymphadenopathy involving the neck, chest and abdomen as detailed above ([place] 5). I do not see any pelvic disease. 2. Possible involvement of the spleen. 3. Osseous involvement is also noted. Electronically Signed   By: Marijo Sanes M.D.   On: 09/01/2020 17:15

## 2020-09-02 NOTE — Anesthesia Preprocedure Evaluation (Addendum)
Anesthesia Evaluation  Patient identified by MRN, date of birth, ID band Patient awake    Reviewed: Allergy & Precautions, NPO status , Patient's Chart, lab work & pertinent test results, reviewed documented beta blocker date and time   History of Anesthesia Complications (+) PONV  Airway Mallampati: II  TM Distance: >3 FB Neck ROM: Full    Dental  (+) Dental Advisory Given, Caps   Pulmonary COPD,  COPD inhaler, Recent URI , Resolved,  07/08/2020 SARS coronavirus POS   breath sounds clear to auscultation       Cardiovascular hypertension, Pt. on medications and Pt. on home beta blockers (-) angina Rhythm:Regular Rate:Normal  '18 STRESS : EF 60%, low rtisk study  '16 ECHO: EF 55-60%, no significant valvular abnormalities   Neuro/Psych negative neurological ROS     GI/Hepatic Neg liver ROS, GERD  Medicated and Controlled,  Endo/Other  negative endocrine ROS  Renal/GU negative Renal ROS     Musculoskeletal  (+) Arthritis , Osteoarthritis,    Abdominal   Peds  Hematology negative hematology ROS (+)   Anesthesia Other Findings   Reproductive/Obstetrics                            Anesthesia Physical Anesthesia Plan  ASA: III  Anesthesia Plan: General   Post-op Pain Management:    Induction: Intravenous  PONV Risk Score and Plan: Ondansetron, Dexamethasone and Treatment may vary due to age or medical condition  Airway Management Planned: LMA  Additional Equipment: None  Intra-op Plan:   Post-operative Plan:   Informed Consent: I have reviewed the patients History and Physical, chart, labs and discussed the procedure including the risks, benefits and alternatives for the proposed anesthesia with the patient or authorized representative who has indicated his/her understanding and acceptance.     Dental advisory given  Plan Discussed with: CRNA and Surgeon  Anesthesia Plan  Comments:        Anesthesia Quick Evaluation

## 2020-09-02 NOTE — Assessment & Plan Note (Signed)
I have reviewed PET CT scan with the patient I agree with Dr. 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

## 2020-09-03 ENCOUNTER — Ambulatory Visit (HOSPITAL_BASED_OUTPATIENT_CLINIC_OR_DEPARTMENT_OTHER)
Admission: RE | Admit: 2020-09-03 | Discharge: 2020-09-03 | Disposition: A | Discharge status: Home / Self Care | Payer: Medicare PPO | Attending: Otolaryngology | Admitting: Otolaryngology

## 2020-09-03 ENCOUNTER — Ambulatory Visit (HOSPITAL_BASED_OUTPATIENT_CLINIC_OR_DEPARTMENT_OTHER): Payer: Medicare PPO | Admitting: Anesthesiology

## 2020-09-03 ENCOUNTER — Encounter (HOSPITAL_BASED_OUTPATIENT_CLINIC_OR_DEPARTMENT_OTHER): Payer: Self-pay | Admitting: Otolaryngology

## 2020-09-03 ENCOUNTER — Encounter (HOSPITAL_BASED_OUTPATIENT_CLINIC_OR_DEPARTMENT_OTHER)
Admission: RE | Disposition: A | Discharge status: Home / Self Care | Payer: Self-pay | Source: Home / Self Care | Attending: Otolaryngology

## 2020-09-03 ENCOUNTER — Other Ambulatory Visit: Payer: Self-pay

## 2020-09-03 DIAGNOSIS — Z800 Family history of malignant neoplasm of digestive organs: Secondary | ICD-10-CM | POA: Diagnosis not present

## 2020-09-03 DIAGNOSIS — Z833 Family history of diabetes mellitus: Secondary | ICD-10-CM | POA: Insufficient documentation

## 2020-09-03 DIAGNOSIS — Z8249 Family history of ischemic heart disease and other diseases of the circulatory system: Secondary | ICD-10-CM | POA: Diagnosis not present

## 2020-09-03 DIAGNOSIS — E785 Hyperlipidemia, unspecified: Secondary | ICD-10-CM | POA: Insufficient documentation

## 2020-09-03 DIAGNOSIS — D487 Neoplasm of uncertain behavior of other specified sites: Secondary | ICD-10-CM | POA: Insufficient documentation

## 2020-09-03 DIAGNOSIS — J449 Chronic obstructive pulmonary disease, unspecified: Secondary | ICD-10-CM | POA: Diagnosis not present

## 2020-09-03 DIAGNOSIS — R590 Localized enlarged lymph nodes: Secondary | ICD-10-CM | POA: Diagnosis not present

## 2020-09-03 DIAGNOSIS — Z96652 Presence of left artificial knee joint: Secondary | ICD-10-CM | POA: Insufficient documentation

## 2020-09-03 DIAGNOSIS — R7303 Prediabetes: Secondary | ICD-10-CM | POA: Diagnosis not present

## 2020-09-03 DIAGNOSIS — Z9049 Acquired absence of other specified parts of digestive tract: Secondary | ICD-10-CM | POA: Insufficient documentation

## 2020-09-03 DIAGNOSIS — I10 Essential (primary) hypertension: Secondary | ICD-10-CM | POA: Insufficient documentation

## 2020-09-03 DIAGNOSIS — Z8041 Family history of malignant neoplasm of ovary: Secondary | ICD-10-CM | POA: Diagnosis not present

## 2020-09-03 DIAGNOSIS — D4989 Neoplasm of unspecified behavior of other specified sites: Secondary | ICD-10-CM | POA: Diagnosis not present

## 2020-09-03 DIAGNOSIS — Z803 Family history of malignant neoplasm of breast: Secondary | ICD-10-CM | POA: Insufficient documentation

## 2020-09-03 DIAGNOSIS — D479 Neoplasm of uncertain behavior of lymphoid, hematopoietic and related tissue, unspecified: Secondary | ICD-10-CM | POA: Diagnosis not present

## 2020-09-03 DIAGNOSIS — G4700 Insomnia, unspecified: Secondary | ICD-10-CM | POA: Diagnosis not present

## 2020-09-03 DIAGNOSIS — Z885 Allergy status to narcotic agent status: Secondary | ICD-10-CM | POA: Insufficient documentation

## 2020-09-03 DIAGNOSIS — Z79899 Other long term (current) drug therapy: Secondary | ICD-10-CM | POA: Insufficient documentation

## 2020-09-03 DIAGNOSIS — Z7951 Long term (current) use of inhaled steroids: Secondary | ICD-10-CM | POA: Insufficient documentation

## 2020-09-03 DIAGNOSIS — Z8051 Family history of malignant neoplasm of kidney: Secondary | ICD-10-CM | POA: Diagnosis not present

## 2020-09-03 DIAGNOSIS — Z7982 Long term (current) use of aspirin: Secondary | ICD-10-CM | POA: Insufficient documentation

## 2020-09-03 HISTORY — PX: MASS BIOPSY: SHX5445

## 2020-09-03 SURGERY — BIOPSY, MASS, NECK
Anesthesia: General | Site: Neck | Laterality: Left

## 2020-09-03 MED ORDER — PHENYLEPHRINE 40 MCG/ML (10ML) SYRINGE FOR IV PUSH (FOR BLOOD PRESSURE SUPPORT)
PREFILLED_SYRINGE | INTRAVENOUS | Status: DC | PRN
  Administered 2020-09-03: 80 ug via INTRAVENOUS
  Administered 2020-09-03: 120 ug via INTRAVENOUS
  Administered 2020-09-03: 80 ug via INTRAVENOUS

## 2020-09-03 MED ORDER — BACITRACIN ZINC 500 UNIT/GM EX OINT
TOPICAL_OINTMENT | CUTANEOUS | Status: AC
  Filled 2020-09-03: qty 28.35

## 2020-09-03 MED ORDER — CEFAZOLIN SODIUM-DEXTROSE 2-4 GM/100ML-% IV SOLN
2.0000 g | INTRAVENOUS | Status: AC
  Administered 2020-09-03: 2 g via INTRAVENOUS

## 2020-09-03 MED ORDER — DEXAMETHASONE SODIUM PHOSPHATE 10 MG/ML IJ SOLN
INTRAMUSCULAR | Status: AC
  Filled 2020-09-03: qty 1

## 2020-09-03 MED ORDER — MIDAZOLAM HCL 2 MG/2ML IJ SOLN
INTRAMUSCULAR | Status: AC
  Filled 2020-09-03: qty 2

## 2020-09-03 MED ORDER — DEXAMETHASONE SODIUM PHOSPHATE 4 MG/ML IJ SOLN
INTRAMUSCULAR | Status: DC | PRN
  Administered 2020-09-03: 4 mg via INTRAVENOUS

## 2020-09-03 MED ORDER — LACTATED RINGERS IV SOLN: INTRAVENOUS | Status: DC

## 2020-09-03 MED ORDER — CEPHALEXIN 500 MG PO CAPS: 500.0000 mg | ORAL_CAPSULE | Freq: Three times a day (TID) | ORAL | 0 refills | Status: DC

## 2020-09-03 MED ORDER — CEFAZOLIN SODIUM-DEXTROSE 2-4 GM/100ML-% IV SOLN
INTRAVENOUS | Status: AC
  Filled 2020-09-03: qty 100

## 2020-09-03 MED ORDER — PROMETHAZINE HCL 25 MG/ML IJ SOLN
6.2500 mg | INTRAMUSCULAR | Status: DC | PRN
Start: 2020-09-03 — End: 2020-09-03

## 2020-09-03 MED ORDER — LIDOCAINE 2% (20 MG/ML) 5 ML SYRINGE
INTRAMUSCULAR | Status: AC
  Filled 2020-09-03: qty 5

## 2020-09-03 MED ORDER — PROPOFOL 10 MG/ML IV BOLUS
INTRAVENOUS | Status: DC | PRN
  Administered 2020-09-03: 130 mg via INTRAVENOUS

## 2020-09-03 MED ORDER — FENTANYL CITRATE (PF) 100 MCG/2ML IJ SOLN: 25.0000 ug | INTRAMUSCULAR | Status: DC | PRN

## 2020-09-03 MED ORDER — MEPERIDINE HCL 25 MG/ML IJ SOLN
6.2500 mg | INTRAMUSCULAR | Status: DC | PRN
Start: 2020-09-03 — End: 2020-09-03

## 2020-09-03 MED ORDER — ROCURONIUM BROMIDE 10 MG/ML (PF) SYRINGE
PREFILLED_SYRINGE | INTRAVENOUS | Status: AC
  Filled 2020-09-03: qty 10

## 2020-09-03 MED ORDER — FENTANYL CITRATE (PF) 100 MCG/2ML IJ SOLN
INTRAMUSCULAR | Status: DC | PRN
  Administered 2020-09-03: 25 ug via INTRAVENOUS
  Administered 2020-09-03: 50 ug via INTRAVENOUS
  Administered 2020-09-03: 25 ug via INTRAVENOUS

## 2020-09-03 MED ORDER — MIDAZOLAM HCL 5 MG/5ML IJ SOLN
INTRAMUSCULAR | Status: DC | PRN
  Administered 2020-09-03: 2 mg via INTRAVENOUS

## 2020-09-03 MED ORDER — CHLORHEXIDINE GLUCONATE CLOTH 2 % EX PADS: 6.0000 | MEDICATED_PAD | Freq: Once | CUTANEOUS | Status: DC

## 2020-09-03 MED ORDER — PHENYLEPHRINE 40 MCG/ML (10ML) SYRINGE FOR IV PUSH (FOR BLOOD PRESSURE SUPPORT)
PREFILLED_SYRINGE | INTRAVENOUS | Status: AC
  Filled 2020-09-03: qty 10

## 2020-09-03 MED ORDER — LIDOCAINE-EPINEPHRINE 1 %-1:100000 IJ SOLN
INTRAMUSCULAR | Status: AC
  Filled 2020-09-03: qty 1

## 2020-09-03 MED ORDER — LIDOCAINE HCL (CARDIAC) PF 100 MG/5ML IV SOSY
PREFILLED_SYRINGE | INTRAVENOUS | Status: DC | PRN
  Administered 2020-09-03: 40 mg via INTRAVENOUS

## 2020-09-03 MED ORDER — ONDANSETRON HCL 4 MG/2ML IJ SOLN
INTRAMUSCULAR | Status: AC
  Filled 2020-09-03: qty 2

## 2020-09-03 MED ORDER — ACETAMINOPHEN 500 MG PO TABS
1000.0000 mg | ORAL_TABLET | Freq: Once | ORAL | Status: AC
  Administered 2020-09-03: 1000 mg via ORAL

## 2020-09-03 MED ORDER — MIDAZOLAM HCL 2 MG/2ML IJ SOLN
0.5000 mg | Freq: Once | INTRAMUSCULAR | Status: DC | PRN
Start: 2020-09-03 — End: 2020-09-03

## 2020-09-03 MED ORDER — ONDANSETRON HCL 4 MG/2ML IJ SOLN
INTRAMUSCULAR | Status: DC | PRN
  Administered 2020-09-03: 4 mg via INTRAVENOUS

## 2020-09-03 MED ORDER — PROPOFOL 500 MG/50ML IV EMUL
INTRAVENOUS | Status: AC
  Filled 2020-09-03: qty 50

## 2020-09-03 MED ORDER — ACETAMINOPHEN 500 MG PO TABS
ORAL_TABLET | ORAL | Status: AC
  Filled 2020-09-03: qty 2

## 2020-09-03 MED ORDER — LIDOCAINE-EPINEPHRINE 1 %-1:100000 IJ SOLN
INTRAMUSCULAR | Status: DC | PRN
  Administered 2020-09-03: 4 mL

## 2020-09-03 MED ORDER — OXYCODONE HCL 5 MG/5ML PO SOLN: 5.0000 mg | Freq: Once | ORAL | Status: DC | PRN

## 2020-09-03 MED ORDER — FENTANYL CITRATE (PF) 100 MCG/2ML IJ SOLN
INTRAMUSCULAR | Status: AC
  Filled 2020-09-03: qty 2

## 2020-09-03 MED ORDER — OXYCODONE HCL 5 MG PO TABS: 5.0000 mg | ORAL_TABLET | Freq: Once | ORAL | Status: DC | PRN

## 2020-09-03 SURGICAL SUPPLY — 57 items
ADH SKN CLS APL DERMABOND .7 (GAUZE/BANDAGES/DRESSINGS)
APL SKNCLS STERI-STRIP NONHPOA (GAUZE/BANDAGES/DRESSINGS)
BENZOIN TINCTURE PRP APPL 2/3 (GAUZE/BANDAGES/DRESSINGS) IMPLANT
BLADE SURG 15 STRL LF DISP TIS (BLADE) ×1 IMPLANT
BLADE SURG 15 STRL SS (BLADE) ×2
CANISTER SUCT 1200ML W/VALVE (MISCELLANEOUS) ×2 IMPLANT
CLEANER CAUTERY TIP 5X5 PAD (MISCELLANEOUS) IMPLANT
CORD BIPOLAR FORCEPS 12FT (ELECTRODE) IMPLANT
COVER BACK TABLE 60X90IN (DRAPES) ×2 IMPLANT
COVER MAYO STAND STRL (DRAPES) ×2 IMPLANT
COVER WAND RF STERILE (DRAPES) IMPLANT
DECANTER SPIKE VIAL GLASS SM (MISCELLANEOUS) ×1 IMPLANT
DERMABOND ADVANCED (GAUZE/BANDAGES/DRESSINGS)
DERMABOND ADVANCED .7 DNX12 (GAUZE/BANDAGES/DRESSINGS) IMPLANT
DRAPE U-SHAPE 76X120 STRL (DRAPES) ×2 IMPLANT
ELECT COATED BLADE 2.86 ST (ELECTRODE) ×2 IMPLANT
ELECT REM PT RETURN 9FT ADLT (ELECTROSURGICAL) ×2
ELECTRODE REM PT RTRN 9FT ADLT (ELECTROSURGICAL) ×1 IMPLANT
GAUZE 4X4 16PLY RFD (DISPOSABLE) IMPLANT
GAUZE SPONGE 4X4 12PLY STRL LF (GAUZE/BANDAGES/DRESSINGS) IMPLANT
GLOVE SS BIOGEL STRL SZ 7.5 (GLOVE) ×1 IMPLANT
GLOVE SUPERSENSE BIOGEL SZ 7.5 (GLOVE) ×1
GLOVE SURG POLYISO LF SZ8 (GLOVE) ×2 IMPLANT
GOWN STRL REUS W/ TWL LRG LVL3 (GOWN DISPOSABLE) ×1 IMPLANT
GOWN STRL REUS W/TWL LRG LVL3 (GOWN DISPOSABLE) ×2
HEMOSTAT SURGICEL .5X2 ABSORB (HEMOSTASIS) IMPLANT
LOCATOR NERVE 3 VOLT (DISPOSABLE) IMPLANT
NEEDLE HYPO 25X1 1.5 SAFETY (NEEDLE) ×2 IMPLANT
NS IRRIG 1000ML POUR BTL (IV SOLUTION) ×2 IMPLANT
PACK BASIN DAY SURGERY FS (CUSTOM PROCEDURE TRAY) ×2 IMPLANT
PAD CLEANER CAUTERY TIP 5X5 (MISCELLANEOUS)
PENCIL SMOKE EVACUATOR (MISCELLANEOUS) ×2 IMPLANT
SLEEVE SCD COMPRESS KNEE MED (STOCKING) ×2 IMPLANT
SPONGE INTESTINAL PEANUT (DISPOSABLE) ×1 IMPLANT
STRIP CLOSURE SKIN 1/2X4 (GAUZE/BANDAGES/DRESSINGS) IMPLANT
STRIP CLOSURE SKIN 1/4X4 (GAUZE/BANDAGES/DRESSINGS) IMPLANT
SUCTION FRAZIER HANDLE 10FR (MISCELLANEOUS)
SUCTION TUBE FRAZIER 10FR DISP (MISCELLANEOUS) IMPLANT
SUT CHROMIC 3 0 PS 2 (SUTURE) ×2 IMPLANT
SUT CHROMIC 3 0 SH 27 (SUTURE) IMPLANT
SUT ETHILON 4 0 PS 2 18 (SUTURE) IMPLANT
SUT ETHILON 5 0 P 3 18 (SUTURE) ×2
SUT NYLON ETHILON 5-0 P-3 1X18 (SUTURE) ×1 IMPLANT
SUT SILK 2 0 TIES 17X18 (SUTURE)
SUT SILK 2-0 18XBRD TIE BLK (SUTURE) IMPLANT
SUT SILK 3 0 SH 30 (SUTURE) IMPLANT
SUT SILK 3 0 TIES 17X18 (SUTURE) ×2
SUT SILK 3-0 18XBRD TIE BLK (SUTURE) ×1 IMPLANT
SUT VIC AB 5-0 P-3 18X BRD (SUTURE) IMPLANT
SUT VIC AB 5-0 P3 18 (SUTURE)
SWAB COLLECTION DEVICE MRSA (MISCELLANEOUS) IMPLANT
SWAB CULTURE ESWAB REG 1ML (MISCELLANEOUS) IMPLANT
SYR BULB EAR ULCER 3OZ GRN STR (SYRINGE) ×2 IMPLANT
SYR CONTROL 10ML LL (SYRINGE) ×2 IMPLANT
TOWEL GREEN STERILE FF (TOWEL DISPOSABLE) ×4 IMPLANT
TRAY DSU PREP LF (CUSTOM PROCEDURE TRAY) ×2 IMPLANT
TUBE CONNECTING 20X1/4 (TUBING) ×2 IMPLANT

## 2020-09-03 NOTE — Transfer of Care (Signed)
Immediate Anesthesia Transfer of Care Note  Patient: Cathy [name]  Procedure(s) Performed: LEFT NECK JUGULAR NODE (Left Neck)  Patient Location: PACU  Anesthesia Type:General  Level of Consciousness: sedated  Airway & Oxygen Therapy: Patient Spontanous Breathing and Patient connected to face mask oxygen  Post-op Assessment: Report given to RN and Post -op Vital signs reviewed and stable  Post vital signs: Reviewed and stable  Last Vitals:  Vitals Value Taken Time  BP 122/59 09/03/20 0915  Temp    Pulse 71 09/03/20 0916  Resp 23 09/03/20 0916  SpO2 100 % 09/03/20 0916  Vitals shown include unvalidated device data.  Last Pain:  Vitals:   09/03/20 0657  TempSrc: Oral  PainSc: 0-No pain      Patients Stated Pain Goal: 3 (03/79/44 4619)  Complications: No complications documented.

## 2020-09-03 NOTE — Anesthesia Postprocedure Evaluation (Signed)
Anesthesia Post Note  Patient: Cathy [name]  Procedure(s) Performed: LEFT NECK JUGULAR NODE (Left Neck)     Patient location during evaluation: Phase II Anesthesia Type: General Level of consciousness: awake and alert, oriented and patient cooperative Pain management: pain level controlled Vital Signs Assessment: post-procedure vital signs reviewed and stable Respiratory status: spontaneous breathing, nonlabored ventilation and respiratory function stable Cardiovascular status: blood pressure returned to baseline and stable Postop Assessment: no apparent nausea or vomiting, adequate PO intake and able to ambulate Anesthetic complications: no   No complications documented.  Last Vitals:  Vitals:   09/03/20 0936 09/03/20 0954  BP: 126/63 122/62  Pulse: 72 72  Resp: (!) 27 16  Temp:  36.9 C  SpO2: 95% 95%    Last Pain:  Vitals:   09/03/20 0954  TempSrc:   PainSc: 0-No pain                 Kaleeya Hancock,E. Natausha Jungwirth

## 2020-09-03 NOTE — Interval H&P Note (Signed)
History and Physical Interval Note:  09/03/2020 7:27 AM  Cathy [name]  has presented today for surgery, with the diagnosis of NEOPLASMA UNCERTAIN BEHAVIOR AND CERVICAL LYMPHADENOPATHY.  The various methods of treatment have been discussed with the patient and family. After consideration of risks, benefits and other options for treatment, the patient has consented to  Procedure(s): LEFT NECK JUGULAR NODE (Left) as a surgical intervention.  The patient's history has been reviewed, patient examined, no change in status, stable for surgery.  I have reviewed the patient's chart and labs.  Questions were answered to the patient's satisfaction.     Melony Overly

## 2020-09-03 NOTE — Discharge Instructions (Signed)
Next dose of Tylenol can be given after 1:00PM.     Post Anesthesia Home Care Instructions  Activity: Get plenty of rest for the remainder of the day. A responsible individual must stay with you for 24 hours following the procedure.  For the next 24 hours, DO NOT: -Drive a car -Paediatric nurse -Drink alcoholic beverages -Take any medication unless instructed by your physician -Make any legal decisions or sign important papers.  Meals: Start with liquid foods such as gelatin or soup. Progress to regular foods as tolerated. Avoid greasy, spicy, heavy foods. If nausea and/or vomiting occur, drink only clear liquids until the nausea and/or vomiting subsides. Call your physician if vomiting continues.  Special Instructions/Symptoms: Your throat may feel dry or sore from the anesthesia or the breathing tube placed in your throat during surgery. If this causes discomfort, gargle with warm salt water. The discomfort should disappear within 24 hours.  If you had a scopolamine patch placed behind your ear for the management of post- operative nausea and/or vomiting:  1. The medication in the patch is effective for 72 hours, after which it should be removed.  Wrap patch in a tissue and discard in the trash. Wash hands thoroughly with soap and water. 2. You may remove the patch earlier than 72 hours if you experience unpleasant side effects which may include dry mouth, dizziness or visual disturbances. 3. Avoid touching the patch. Wash your hands with soap and water after contact with the patch.  Keep incision site dry for the next 24 hrs Apply cool compress to neck to reduce swelling Tylenol or ibuprofen prn pain Keflex 500 mg tid for 5 days Call office if you have any problems or questions  [phone]

## 2020-09-03 NOTE — Anesthesia Procedure Notes (Signed)
Procedure Name: LMA Insertion Performed by: Leighton Luster, [place], CRNA Pre-anesthesia Checklist: Patient identified, Emergency Drugs available, Suction available and Patient being monitored Patient Re-evaluated:Patient Re-evaluated prior to induction Oxygen Delivery Method: Circle system utilized Preoxygenation: Pre-oxygenation with 100% oxygen Induction Type: IV induction Ventilation: Mask ventilation without difficulty LMA: LMA inserted LMA Size: 4.0 Number of attempts: 1 Airway Equipment and Method: Bite block Placement Confirmation: positive ETCO2 Tube secured with: Tape Dental Injury: Teeth and Oropharynx as per pre-operative assessment        

## 2020-09-03 NOTE — Brief Op Note (Signed)
09/03/2020  9:09 AM  PATIENT:  Cathy [name]  75 y.o. female  PRE-OPERATIVE DIAGNOSIS:  NEOPLASMA UNCERTAIN BEHAVIOR AND CERVICAL LYMPHADENOPATHY  POST-OPERATIVE DIAGNOSIS:  NEOPLASMA UNCERTAIN BEHAVIOR AND CERVICAL LYMPHADENOPATHY  PROCEDURE:  Procedure(s): LEFT NECK JUGULAR NODE (Left)  SURGEON:  Surgeon(s) and Role:    Rozetta Nunnery, MD - Primary  PHYSICIAN ASSISTANT:   ASSISTANTS: none   ANESTHESIA:   general  EBL:  10 mL   BLOOD ADMINISTERED:none  DRAINS: none   LOCAL MEDICATIONS USED:  XYLOCAINE   SPECIMEN:  Source of Specimen:  left inferior cervical lymph node  DISPOSITION OF SPECIMEN:  PATHOLOGY  COUNTS:  YES  TOURNIQUET:  * No tourniquets in log *  DICTATION: .Other Dictation: Dictation Number [id number]  PLAN OF CARE: Discharge to home after PACU  PATIENT DISPOSITION:  PACU - hemodynamically stable.   Delay start of Pharmacological VTE agent (>24hrs) due to surgical blood loss or risk of bleeding: yes

## 2020-09-03 NOTE — Op Note (Signed)
NAME: Cathy [name], STURDY MEDICAL RECORD NO: [id number] ACCOUNT NO: [id number] DATE OF BIRTH: 1945-12-28 FACILITY: [facility] LOCATION: [facility] PHYSICIAN: Leonides Sake. [name], MD  Operative Report   DATE OF PROCEDURE: 09/03/2020  PREOPERATIVE DIAGNOSIS:  Left cervical lymphadenopathy.  POSTOPERATIVE DIAGNOSIS:  Left cervical lymphadenopathy.  OPERATION:  Excision of deep left cervical lymph node.  SURGEON:  Dr. Melony Overly.  ANESTHESIA:  General LMA.  BLOOD LOSS:  Less than 10 mL  COMPLICATIONS:  None.  BRIEF CLINICAL NOTE:  This is a 75 year old female with non-Hodgkin's lymphoma, who is followed by Dr. [name].  She has had recent increased lymphadenopathy and Dr. [name] referred her to me for excisional biopsy of one of the lymph nodes.  On exam,  she has a larger superior lymph node just to the parotid gland and a slightly smaller inferior cervical node that would be better for excisional biopsy concerning the patient's risk and healing.  She was taken to the operating room at this time for  excision of a deep inferior left cervical lymph node.  DESCRIPTION OF PROCEDURE:  After adequate LMA anesthesia, the position of the lymph node was palpated and marked out and the area was injected with Xylocaine with epinephrine for hemostasis.  The neck was then prepped with Betadine solution and draped  off with sterile towels.  A horizontal incision was made directly over the lymph node.  Dissection was carried down through the subcutaneous tissues, the platysma muscle and a portion of the posterior sternocleidomastoid muscle.  This was retracted and  the lymph node was encountered.  Careful dissection around the left accessory nerve   was performed making sure not to damage any of the surrounding nodes as well as using cautery and bipolar cautery for feeding veins.  The patient had minimal bleeding.  The lymph  node was removed and sent saline fresh for lymphoma workup to  pathology.  Hemostasis was obtained with bipolar cautery.  The wound was irrigated with saline and then closed with 3-0 chromic suture subcutaneously and 5-0 Vicryl subcuticular stitch  followed by Dermabond.  The patient was awoken from anesthesia and transferred to recovery room.  Postop doing well.  DISPOSITION:  The patient was discharged home later this morning.  Placed on Keflex 500 mg t.i.d. for 5 days.  She will follow up in my office in 7-10 days for recheck.    Elián.Darby D: 09/03/2020 9:15:34 am T: 09/03/2020 10:49:00 am  JOB: [id number]/ [id number]

## 2020-09-04 ENCOUNTER — Encounter (HOSPITAL_BASED_OUTPATIENT_CLINIC_OR_DEPARTMENT_OTHER): Payer: Self-pay | Admitting: Otolaryngology

## 2020-09-07 ENCOUNTER — Telehealth: Payer: Self-pay

## 2020-09-07 NOTE — Telephone Encounter (Signed)
Called and moved appt to 3/30 at 0915 due to pathology report not being back. She is aware of appt time/date.

## 2020-09-08 ENCOUNTER — Inpatient Hospital Stay: Payer: Medicare PPO | Admitting: Hematology and Oncology

## 2020-09-08 ENCOUNTER — Telehealth: Payer: Self-pay

## 2020-09-08 ENCOUNTER — Ambulatory Visit (HOSPITAL_COMMUNITY): Payer: Medicare PPO

## 2020-09-08 LAB — SURGICAL PATHOLOGY

## 2020-09-08 NOTE — Progress Notes (Signed)
Cardiology Office Note   Date:  09/14/2020   ID:  Cathy [name], Cathy [name] 1946/03/21, MRN [id number]  PCP:  Eulogio Bear, NP  Cardiologist:   Jenkins Rouge, MD   No chief complaint on file.     History of Present Illness:  75 y.o. first seen 08/09/16 for chest pain. Atypical with GI overtones GERD CRF;s HTN DM-2  Previous echo 11/03/14 EF 55-60% Had f/u myovue 08/16/16 normal no ischemia EF 60%   Has had adenopathy in neck chest and upper abdomen with biopsy showing atypical lymphoid hyperplasia.  Sees oncology [name] for this.  Adenopathy progressive by CT and had left neck biopsy by Dr [name] 09/03/20 PET scan with extensive hypermetabolic lymphadenopathy involving neck chest and abdomen  Biopsy with lymphoma Will be seeing Gorsucsch this week to find out Rx Asthma has been using inhaler given Breo by Dr Lonell Grandchild 06/12/17   She is sedentary and eats too much Son MD in [place] has a 11 yo and 41 yo grand-children  Received COVID vaccine 07/06/19   No cardiac issues needs refill on diuretic which has worked well for BP Has 13/8 yo grand children  Past Medical History:  Diagnosis Date  . Abnormal glucose   . Allergy   . Asthma    Allergy induced  . Benign breast cyst in female   . GERD (gastroesophageal reflux disease)   . Hilar lymphadenopathy 08/01/2011  . History of nuclear stress test 2009   Treadmill and Stress Myoview- no CAD  . Hyperlipidemia   . Hypertension   . Lymphadenopathy of left cervical region 08/01/2011  . Nephrolithiasis 1984  . Osteopenia   . PONV (postoperative nausea and vomiting)   . Post-menopause   . Pre-diabetes    borderline  . 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 Vision changes     Past Surgical History:  Procedure Laterality Date  . CHOLECYSTECTOMY    . LITHOTRIPSY  1984  . LYMPH NODE BIOPSY    . MASS BIOPSY Left 09/03/2020   Procedure: LEFT NECK JUGULAR NODE;  Surgeon: Rozetta Nunnery, MD;  Location: [facility];  Service: ENT;  Laterality: Left;  . 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 albuterol (VENTOLIN HFA) 108 (90 Base) MCG/ACT inhaler Inhale 2 puffs into the lungs every 6 (six) hours as needed for wheezing or shortness of breath. 8 g 0  . aspirin EC 81 MG tablet Take 81 mg by mouth daily.    Marland Kitchen atorvastatin (LIPITOR) 20 MG tablet TAKE 1 TABLET BY MOUTH ONCE DAILY 90 tablet 3  . BREO ELLIPTA 100-25 MCG/INH AEPB INHALE 1 PUFF INTO THE LUNGS DAILY 60 each 5  . cetirizine (ZYRTEC) 10 MG tablet TAKE 1 TABLET BY MOUTH ONCE DAILY 90 tablet 2  . denosumab (PROLIA) 60 MG/ML SOSY injection Inject 60 mg into the skin every 6 (six) months.    Marland Kitchen glucose blood (ACCU-CHEK AVIVA PLUS) test strip Use to Monitor blood sugars daily 100 each 5  . hydrochlorothiazide (MICROZIDE) 12.5 MG capsule Take 1 capsule (12.5 mg total) by mouth daily. 90 capsule 3  . Ibuprofen 200 MG CAPS Take 1 capsule by mouth daily.    Marland Kitchen losartan (COZAAR) 100 MG tablet TAKE 1 TABLET BY  MOUTH ONCE DAILY 90 tablet 2  . metoprolol succinate (TOPROL-XL) 50 MG 24 hr tablet TAKE 1 TABLET BY MOUTH ONCE A DAY WITH OR IMMEDIATELY FOLLOWING A MEAL 90 tablet 2  . Multiple Vitamin (MULTIVITAMIN) capsule Take 1 capsule by mouth daily.    Marland Kitchen omeprazole (PRILOSEC) 20 MG capsule TAKE 1 CAPSULE BY MOUTH ONCE DAILY 90 capsule 3   Current Facility-Administered Medications  Medication Dose Route Frequency Provider Last Rate Last Admin  . denosumab (PROLIA) injection 60 mg  60 mg Subcutaneous Q6 months Buelah Manis, Kawanta F, MD   60 mg at 08/10/20 1000    Allergies:   Codeine    Social History:  The patient  reports that she has never smoked. She has never used smokeless tobacco. She reports that she does not drink alcohol and does not use  drugs.   Family History:  The patient's family history includes Alcohol abuse in her brother; Asthma in her sister; Breast cancer in her cousin; Cancer in her father, maternal aunt, and maternal aunt; Dementia in her mother; Diabetes in her brother; Heart attack in her mother; Hypertension in her mother; Stroke in her mother.    ROS:  Please see the history of present illness.   Otherwise, review of systems are positive for none.   All other systems are reviewed and negative.    PHYSICAL EXAM: VS:  BP 116/68   Pulse 87   Ht 5\' 3"  (1.6 m)   Wt 65.3 kg   SpO2 99%   BMI 25.51 kg/m  , BMI Body mass index is 25.51 kg/m. Affect appropriate Healthy:  appears stated age 62: left neck adenopathy post biopsy  Neck supple with cervical lymphadenopathy  JVP normal no bruits no thyromegaly Lungs clear with no wheezing and good diaphragmatic motion Heart:  S1/S2 no murmur, no rub, gallop or click PMI normal Abdomen: benighn, BS positve, no tenderness, no AAA no bruit.  No HSM or HJR Distal pulses intact with no bruits Left LLE  Edema post left TKR  Neuro non-focal Skin warm and dry No muscular weakness   EKG:   09/03/19 SR rate 75 normal   Recent Labs: 01/13/2020: TSH 2.67 08/10/2020: ALT 9; Hemoglobin 12.4; Platelets 463 08/31/2020: BUN 14; Creatinine, Ser 0.98; Potassium 4.0; Sodium 135    Lipid Panel    Component Value Date/Time   CHOL 140 08/10/2020 1041   TRIG 147 08/10/2020 1041   HDL 31 (L) 08/10/2020 1041   CHOLHDL 4.5 08/10/2020 1041   VLDL 34 (H) 12/06/2016 0932   LDLCALC 85 08/10/2020 1041      Wt Readings from Last 3 Encounters:  09/14/20 65.3 kg  09/03/20 66.1 kg  09/02/20 66.9 kg      Other studies Reviewed: Additional studies/ records that were reviewed today include: notes Dr Buelah Manis labs and ECG Echo 2016.and op note Dr [name] 09/03/20    ASSESSMENT AND PLAN:  1. Chest Pain atypical normal myovue 08/16/16 observe  2. GERD: continue H2 blocker low  carb diet avoid excess NSAI's 3. DM  Discussed low carb diet.  Target hemoglobin A1c is 6.5 or less.  Continue current medications. 4. Lymphadenopathy  History of non Hodgkins Lymphoma post biopsy 09/03/20 with lymphoma f/u Oncology to start Rx will likely need f/u echo if getting chemo to check baseline EF and strain 5. HTN:  Elevated with LE edema on left start HCTZ 12.5 mg daily  F/u bMET in 4 weeks with son  6. Lipids:  On statin  LDL at goal with no documented vascular disease    F/u in 3 months   Jenkins Rouge

## 2020-09-08 NOTE — Telephone Encounter (Signed)
Called back and moved appt to 2:30, she is aware of appt time. Waiting on pathology results. Will call back and move appt of pathology does not result.

## 2020-09-08 NOTE — Telephone Encounter (Signed)
Called and left a message asking her to call the office regarding tomorrows appt.

## 2020-09-09 ENCOUNTER — Inpatient Hospital Stay: Payer: Medicare PPO | Admitting: Hematology and Oncology

## 2020-09-09 ENCOUNTER — Telehealth: Payer: Self-pay

## 2020-09-09 NOTE — Telephone Encounter (Signed)
Called and moved appt to 3/5 at 2:15 due to pathology not resulting. She is aware of appt time.

## 2020-09-10 ENCOUNTER — Other Ambulatory Visit: Payer: Self-pay

## 2020-09-10 DIAGNOSIS — C8190 Hodgkin lymphoma, unspecified, unspecified site: Secondary | ICD-10-CM

## 2020-09-10 DIAGNOSIS — Z01818 Encounter for other preprocedural examination: Secondary | ICD-10-CM

## 2020-09-10 LAB — SURGICAL PATHOLOGY

## 2020-09-14 ENCOUNTER — Ambulatory Visit: Payer: Medicare PPO

## 2020-09-14 ENCOUNTER — Encounter (INDEPENDENT_AMBULATORY_CARE_PROVIDER_SITE_OTHER): Payer: Self-pay | Admitting: Otolaryngology

## 2020-09-14 ENCOUNTER — Encounter: Payer: Self-pay | Admitting: Cardiovascular Disease

## 2020-09-14 ENCOUNTER — Other Ambulatory Visit: Payer: Self-pay | Admitting: Hematology and Oncology

## 2020-09-14 ENCOUNTER — Other Ambulatory Visit: Payer: Self-pay

## 2020-09-14 ENCOUNTER — Ambulatory Visit: Payer: Medicare PPO | Admitting: Cardiovascular Disease

## 2020-09-14 ENCOUNTER — Ambulatory Visit (INDEPENDENT_AMBULATORY_CARE_PROVIDER_SITE_OTHER): Payer: Medicare PPO | Admitting: Otolaryngology

## 2020-09-14 ENCOUNTER — Other Ambulatory Visit: Payer: Self-pay | Admitting: Pharmacist

## 2020-09-14 VITALS — Temp 97.5°F

## 2020-09-14 VITALS — BP 116/68 | HR 87 | Ht 63.0 in | Wt 144.0 lb

## 2020-09-14 DIAGNOSIS — Z4889 Encounter for other specified surgical aftercare: Secondary | ICD-10-CM

## 2020-09-14 DIAGNOSIS — I10 Essential (primary) hypertension: Secondary | ICD-10-CM

## 2020-09-14 DIAGNOSIS — C8118 Nodular sclerosis classical Hodgkin lymphoma, lymph nodes of multiple sites: Secondary | ICD-10-CM

## 2020-09-14 DIAGNOSIS — R079 Chest pain, unspecified: Secondary | ICD-10-CM

## 2020-09-14 DIAGNOSIS — C8591 Non-Hodgkin lymphoma, unspecified, lymph nodes of head, face, and neck: Secondary | ICD-10-CM | POA: Diagnosis not present

## 2020-09-14 DIAGNOSIS — C8110 Nodular sclerosis classical Hodgkin lymphoma, unspecified site: Secondary | ICD-10-CM | POA: Insufficient documentation

## 2020-09-14 DIAGNOSIS — C8598 Non-Hodgkin lymphoma, unspecified, lymph nodes of multiple sites: Secondary | ICD-10-CM

## 2020-09-14 NOTE — Patient Instructions (Addendum)
Medication Instructions:  Your physician recommends that you continue on your current medications as directed. Please refer to the Current Medication list given to you today.  *If you need a refill on your cardiac medications before your next appointment, please call your pharmacy*   Lab Work: None  If you have labs (blood work) drawn today and your tests are completely normal, you will receive your results only by: Marland Kitchen MyChart Message (if you have MyChart) OR . A paper copy in the mail If you have any lab test that is abnormal or we need to change your treatment, we will call you to review the results.   Testing/Procedures: None  Follow-Up: Follow up with Dr. Johnsie Cancel on 01/01/21 at 8:30 AM   Other Instructions None

## 2020-09-14 NOTE — Progress Notes (Signed)
HPI: Cathy [name] is a 75 y.o. female who presents 10 days s/p excisional biopsy of left lower neck node for evaluation of lymphoma for Dr [name].  She is doing well with no specific complaints.   Past Medical History:  Diagnosis Date  . Abnormal glucose   . Allergy   . Asthma    Allergy induced  . Benign breast cyst in female   . GERD (gastroesophageal reflux disease)   . Hilar lymphadenopathy 08/01/2011  . History of nuclear stress test 2009   Treadmill and Stress Myoview- no CAD  . Hyperlipidemia   . Hypertension   . Lymphadenopathy of left cervical region 08/01/2011  . Nephrolithiasis 1984  . Osteopenia   . PONV (postoperative nausea and vomiting)   . Post-menopause   . Pre-diabetes    borderline  . 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 Vision changes    Past Surgical History:  Procedure Laterality Date  . CHOLECYSTECTOMY    . LITHOTRIPSY  1984  . LYMPH NODE BIOPSY    . MASS BIOPSY Left 09/03/2020   Procedure: LEFT NECK JUGULAR NODE;  Surgeon: Rozetta Nunnery, MD;  Location: [facility];  Service: ENT;  Laterality: Left;  . 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 Marital status: Widowed    Spouse name: Not on file  . Number of children: Not on file  . Years of education: Not on file  . Highest education level: Not on file  Occupational History  . Not on file  Tobacco Use  . Smoking status: Never Smoker  . Smokeless tobacco: Never Used  Substance and Sexual Activity  . Alcohol use: No  . Drug use: No  . Sexual activity: Not Currently    Birth control/protection: Post-menopausal  Other Topics Concern  . Not on file  Social History Narrative  . Not on file   Social Determinants of Health   Financial Resource Strain: Not on file  Food Insecurity:  Not on file  Transportation Needs: Not on file  Physical Activity: Not on file  Stress: Not on file  Social Connections: Not on file   Family History  Problem Relation Age of Onset  . Hypertension Mother   . Stroke Mother   . Heart attack Mother        CABG, 5 Stents  . Dementia Mother   . Cancer Father        Kidney  . Diabetes Brother        Borderline  . Alcohol abuse Brother   . Asthma Sister   . Cancer Maternal Aunt        stomach ca  . Cancer Maternal Aunt        ovarian ca  . Breast cancer Cousin    Allergies  Allergen Reactions  . Codeine     Patients states when she was young, she was told she "acted weird" after being given codeine   Prior to Admission medications   Medication Sig Start Date End Date Taking? Authorizing Provider  albuterol (VENTOLIN HFA) 108 (90 Base) MCG/ACT inhaler Inhale 2 puffs into the lungs every 6 (six) hours as needed for wheezing or shortness of breath. 07/10/20   Alycia Rossetti, MD  aspirin EC 81 MG tablet Take 81 mg by mouth  daily.    [provider]  atorvastatin (LIPITOR) 20 MG tablet TAKE 1 TABLET BY MOUTH ONCE DAILY 05/12/20   [place], Modena Nunnery, MD  BREO ELLIPTA 100-25 MCG/INH AEPB INHALE 1 PUFF INTO THE LUNGS DAILY 04/03/20   Susy Frizzle, MD  cetirizine (ZYRTEC) 10 MG tablet TAKE 1 TABLET BY MOUTH ONCE DAILY 11/13/19   Alycia Rossetti, MD  denosumab (PROLIA) 60 MG/ML SOSY injection Inject 60 mg into the skin every 6 (six) months.    [provider]  glucose blood (ACCU-CHEK AVIVA PLUS) test strip Use to Monitor blood sugars daily 07/22/20   Alycia Rossetti, MD  hydrochlorothiazide (MICROZIDE) 12.5 MG capsule Take 1 capsule (12.5 mg total) by mouth daily. 09/03/19   Josue Hector, MD  Ibuprofen 200 MG CAPS Take 1 capsule by mouth daily.    [provider]  losartan (COZAAR) 100 MG tablet TAKE 1 TABLET BY MOUTH ONCE DAILY 02/14/20   Alycia Rossetti, MD  metoprolol succinate (TOPROL-XL) 50 MG 24 hr  tablet TAKE 1 TABLET BY MOUTH ONCE A DAY WITH OR IMMEDIATELY FOLLOWING A MEAL 02/14/20   , Modena Nunnery, MD  Multiple Vitamin (MULTIVITAMIN) capsule Take 1 capsule by mouth daily.    [provider]  omeprazole (PRILOSEC) 20 MG capsule TAKE 1 CAPSULE BY MOUTH ONCE DAILY 05/04/20   Alycia Rossetti, MD     Physical Exam: Excision site is healing nicely with no signs of infection.  Dermabond is still adherent over the incision site.   Assessment: S/p excisional biopsy of left lower neck node.  Plan: This is doing well. Discussed with her concerning follow-up with Dr. [name] concerning review of the pathology.  She can remove the Dermabond from the incision site over the next couple of days.   Radene Journey, MD

## 2020-09-14 NOTE — Progress Notes (Signed)
START ON PATHWAY REGIMEN - Lymphoma and CLL     Cycles 1 and 2: A cycle is every 21 days:     Brentuximab vedotin    Cycles 3 through 8: A cycle is every 28 days:     Dacarbazine      Doxorubicin      Vinblastine   **Always confirm dose/schedule in your pharmacy ordering system**  Patient Characteristics: Classical Hodgkin Lymphoma, First Line, Stage III / IV, Age ? 61 Disease Type: Not Applicable Disease Type: Not Applicable Disease Type: Classical Hodgkin Lymphoma Line of therapy: First Line Age: ? 75 Intent of Therapy: Curative Intent, Discussed with Patient

## 2020-09-15 ENCOUNTER — Other Ambulatory Visit: Payer: Medicare PPO

## 2020-09-15 ENCOUNTER — Encounter: Payer: Self-pay | Admitting: Hematology and Oncology

## 2020-09-15 ENCOUNTER — Inpatient Hospital Stay: Payer: Medicare PPO | Attending: Hematology and Oncology | Admitting: Hematology and Oncology

## 2020-09-15 ENCOUNTER — Telehealth: Payer: Self-pay | Admitting: Hematology and Oncology

## 2020-09-15 ENCOUNTER — Ambulatory Visit
Admission: RE | Admit: 2020-09-15 | Discharge: 2020-09-15 | Disposition: A | Discharge status: Home / Self Care | Payer: Medicare PPO | Source: Ambulatory Visit | Attending: Family Medicine | Admitting: Family Medicine

## 2020-09-15 VITALS — BP 144/61 | HR 100 | Temp 98.0°F | Resp 19 | Ht 63.0 in | Wt 146.8 lb

## 2020-09-15 DIAGNOSIS — I10 Essential (primary) hypertension: Secondary | ICD-10-CM | POA: Diagnosis not present

## 2020-09-15 DIAGNOSIS — I313 Pericardial effusion (noninflammatory): Secondary | ICD-10-CM | POA: Insufficient documentation

## 2020-09-15 DIAGNOSIS — Z5111 Encounter for antineoplastic chemotherapy: Secondary | ICD-10-CM | POA: Diagnosis not present

## 2020-09-15 DIAGNOSIS — K219 Gastro-esophageal reflux disease without esophagitis: Secondary | ICD-10-CM | POA: Insufficient documentation

## 2020-09-15 DIAGNOSIS — C8590 Non-Hodgkin lymphoma, unspecified, unspecified site: Secondary | ICD-10-CM | POA: Insufficient documentation

## 2020-09-15 DIAGNOSIS — G609 Hereditary and idiopathic neuropathy, unspecified: Secondary | ICD-10-CM | POA: Diagnosis not present

## 2020-09-15 DIAGNOSIS — G629 Polyneuropathy, unspecified: Secondary | ICD-10-CM | POA: Insufficient documentation

## 2020-09-15 DIAGNOSIS — Z9221 Personal history of antineoplastic chemotherapy: Secondary | ICD-10-CM | POA: Insufficient documentation

## 2020-09-15 DIAGNOSIS — C8118 Nodular sclerosis classical Hodgkin lymphoma, lymph nodes of multiple sites: Secondary | ICD-10-CM

## 2020-09-15 DIAGNOSIS — Z79899 Other long term (current) drug therapy: Secondary | ICD-10-CM | POA: Diagnosis not present

## 2020-09-15 DIAGNOSIS — E785 Hyperlipidemia, unspecified: Secondary | ICD-10-CM | POA: Diagnosis not present

## 2020-09-15 DIAGNOSIS — Z5189 Encounter for other specified aftercare: Secondary | ICD-10-CM | POA: Diagnosis not present

## 2020-09-15 MED ORDER — ALLOPURINOL 300 MG PO TABS: 300.0000 mg | ORAL_TABLET | Freq: Every day | ORAL | 1 refills | Status: DC

## 2020-09-15 MED ORDER — LIDOCAINE-PRILOCAINE 2.5-2.5 % EX CREA: TOPICAL_CREAM | CUTANEOUS | 3 refills | Status: AC

## 2020-09-15 MED ORDER — ACYCLOVIR 400 MG PO TABS: 400.0000 mg | ORAL_TABLET | Freq: Two times a day (BID) | ORAL | 6 refills | Status: DC

## 2020-09-15 MED ORDER — PROCHLORPERAZINE MALEATE 10 MG PO TABS: 10.0000 mg | ORAL_TABLET | Freq: Four times a day (QID) | ORAL | 1 refills | Status: DC | PRN

## 2020-09-15 MED ORDER — ONDANSETRON HCL 8 MG PO TABS: 8.0000 mg | ORAL_TABLET | Freq: Three times a day (TID) | ORAL | 1 refills | Status: DC | PRN

## 2020-09-15 NOTE — Assessment & Plan Note (Signed)
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

## 2020-09-15 NOTE — Telephone Encounter (Signed)
Scheduled per 04/05 staff message, patient received updated calender and after visit summary.

## 2020-09-15 NOTE — Assessment & Plan Note (Signed)
I have reviewed results from recent pathology and gave the patient copies of her biopsy report The patient has stage IV advanced Hodgkin lymphoma We did review current NCCN guidelines We discussed the risk, benefits, side effects of ABVD versus brentuximab plus AVD  The recommendation is based on NCCN guidelines  Brentuximab vedotin with chemotherapy for stage III or IV classical Hodgkin lymphoma (ECHELON-1): 5-year update of an international, open-label, randomised, phase 3 trial by Deborah Chalk. al  http://www.baxter.com/  Summary Background Despite advances in the treatment of Hodgkin lymphoma with the introduction of PET-adapted regimens, practical challenges prevent more widespread use of these approaches. The ECHELON-1 study assessed the safety and efficacy of front-line A+AVD (brentuximab vedotin, doxorubicin, vinblastine, and dacarbazine) versus ABVD (doxorubicin, bleomycin, vinblastine, and dacarbazine) in patients with stage III or IV classical Hodgkin lymphoma. The primary analysis showed improved modified progression-free survival with A+AVD. We present an updated analysis of ECHELON-1 at 5 years, an important landmark for this patient population.  Methods ECHELON-1 was an international, open-label, randomised, phase 3 trial done at 218 clinical sites, including hospitals, cancer centres, and community clinics, in 21 countries. Previously untreated patients (?18 years with an [place] [employer] performance status of ?2) with stage III or IV classical Hodgkin lymphoma were randomly assigned (1:1) to receive A+AVD (brentuximab vedotin, 12 mg/kg of bodyweight, doxorubicin 25 mg/m2 of body surface area, vinblastine 6 mg/m2, and dacarbazine 375 mg/m2) or ABVD (doxorubicin 25 mg/m2, bleomycin 10 U/m2, vinblastine 6 mg/m2, and dacarbazine 375 mg/m2) intravenously on days 1 and 15 of each 28-day cycle for up to six cycles. Stratification  factors included region ([place] vs [place] vs [place]) and International Prognostic Score risk group (low, intermediate, or high risk). The primary endpoint was modified progression-free survival; this 5-year update includes analysis of progression-free survival as per investigator assessment in the intention-to-treat population, which was an exploratory endpoint, although the 5-year analysis was not prespecified in the protocol. This trial is registered with [employer] [id number]) and [employer] [phone]), and is ongoing.  Findings Between May 02, 2011, and Jun 25, 2014, 1334 patients were randomly assigned to receive A+AVD (n=664) or ABVD (n=670). At a median follow-up of 609 months (IQR 522-673), 5-year progression-free survival was 822% (95% CI 790-850) with A+AVD and 753% (094-709) with ABVD (hazard ratio [HR] 068 [95% CI 053-087]; p=00017). Among PET-2-negative patients, 5-year progression-free survival was higher with A+AVD than with ABVD (849% [95% CI 817-876] vs 789% [752-821]; HR 066 [95% CI 050-088]; p=00035). 5-year progression-free survival for PET-2-positive patients was 606% (95% CI 450-731) with A+AVD versus 459% (327-582) with ABVD (HR 070 [95% CI 039-126]; p=023). Peripheral neuropathy continued to improve or resolve over time with both A+AVD (375 [85%] of 443 patients) and ABVD (245 [86%] of 286 patients); more patients had ongoing peripheral neuropathy in the A+AVD group (127 [19%] of 662) than in the ABVD group (59 [9%] of 659). Fewer secondary malignancies were reported with A+AVD (19 [3%] of 662) than with ABVD (29 [4%] of 659). More livebirths were reported in the A+AVD group (n=75) than in the ABVD group (n=50).  Interpretation With 5 years of follow-up, A+AVD showed robust and durable improvement in progression-free survival versus ABVD, regardless of PET-2 status, and a consistent safety profile. On the basis of these findings, A+AVD  should be preferred over ABVD for patients with previously untreated stage III or IV classical Hodgkin lymphoma.  We discussed the role of chemotherapy. The intent is for curative intent  We discussed some of  the risks, benefits, side-effects of Adriamycin, Brentuximab, Dacarbazine and Vinblastine  Some of the short term side-effects included, though not limited to, risk of fatigue, risk of allergic reactions, cardiomyopathy, peripheral neuropathy, mouth sores, weight loss, pancytopenia, life-threatening infections, need for transfusions of blood products, nausea, vomiting, change in bowel habits, blood clots, admission to hospital for various reasons, and risks of death.   Long term side-effects are also discussed including risks of infertility, permanent damage to nerve function, chronic fatigue, and rare secondary malignancy including bone marrow disorders and leukemia.   The patient is aware that the response rates discussed earlier is not guaranteed.  After a long discussion, patient made an informed decision to proceed with the prescribed plan of care.  I will get insurance authorization for upfront G-CSF support I will start her on acyclovir for antimicrobial prophylaxis and allopurinol for tumor lysis prophylaxis I will schedule chemo education class, port placement and baseline echocardiogram I plan to reduce vinblastine upfront 50% reduction to reduce risk of peripheral neuropathy

## 2020-09-15 NOTE — Progress Notes (Signed)
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 al  [email or web address]  Summary Background Despite advances in the treatment of Hodgkin lymphoma with the introduction of PET-adapted regimens, practical challenges prevent more widespread use of these approaches. The ECHELON-1 study assessed the safety and efficacy of front-line A+AVD (brentuximab vedotin, doxorubicin, vinblastine, and dacarbazine) versus ABVD (doxorubicin, bleomycin, vinblastine, and dacarbazine) in patients with stage III or IV classical Hodgkin lymphoma. The primary analysis showed improved modified progression-free survival with A+AVD. We present an updated analysis of ECHELON-1 at 5 years, an important landmark for this patient population.  Methods ECHELON-1 was an international, open-label, randomised, phase 3 trial done at 218 clinical sites, including hospitals, cancer centres, and community clinics, in 21 countries. Previously untreated patients (?18 years with an [employer] performance status of ?2) with stage III or IV classical Hodgkin lymphoma were randomly assigned (1:1) to receive  A+AVD (brentuximab vedotin, 12 mg/kg of bodyweight, doxorubicin 25 mg/m2 of body surface area, vinblastine 6 mg/m2, and dacarbazine 375 mg/m2) or ABVD (doxorubicin 25 mg/m2, bleomycin 10 U/m2, vinblastine 6 mg/m2, and dacarbazine 375 mg/m2) intravenously on days 1 and 15 of each 28-day cycle for up to six cycles. Stratification factors included region ([place] vs [place] vs [place]) and International Prognostic Score risk group (low, intermediate, or high risk). The primary endpoint was modified progression-free survival; this 5-year update includes analysis of progression-free survival as per investigator assessment in the intention-to-treat population, which was an exploratory endpoint, although the 5-year analysis was not prespecified in the protocol. This trial is registered with [place].gov [id number]) and [employer] [phone]), and is ongoing.  Findings Between May 02, 2011, and Jun 25, 2014, 1334 patients were randomly assigned to receive A+AVD (n=664) or ABVD (n=670). At a median follow-up of 609 months (IQR 522-673), 5-year progression-free survival was 822% (95% CI 790-850) with A+AVD and 753% (809-983) with ABVD (hazard ratio [HR] 068 [95% CI 053-087]; p=00017). Among PET-2-negative patients, 5-year progression-free survival was higher with A+AVD than with ABVD (849% [95% CI 817-876] vs 789% [752-821]; HR 066 [95% CI 050-088]; p=00035). 5-year progression-free survival for PET-2-positive patients was 606% (95% CI 450-731) with A+AVD versus 459% (327-582) with ABVD (HR 070 [95% CI 039-126]; p=023). Peripheral neuropathy continued to improve or resolve over time with both A+AVD (375 [85%] of 443 patients) and ABVD (245 [86%] of 286 patients); more patients had ongoing peripheral neuropathy in the A+AVD group (127 [19%] of 662) than in the ABVD group (59 [9%] of 659). Fewer secondary malignancies were reported with A+AVD (19 [3%] of 662) than with ABVD (29 [4%]  of 659). More livebirths were reported in the A+AVD group (n=75) than in the ABVD group (n=50).  Interpretation With 5 years of follow-up, A+AVD showed robust and durable improvement in progression-free survival  versus ABVD, regardless of PET-2 status, and a consistent safety profile. On the basis of these findings, A+AVD should be preferred over ABVD for patients with previously untreated stage III or IV classical Hodgkin lymphoma.  We discussed the role of chemotherapy. The intent is for curative intent  We discussed some of the risks, benefits, side-effects of Adriamycin, Brentuximab, Dacarbazine and Vinblastine  Some of the short term side-effects included, though not limited to, risk of fatigue, risk of allergic reactions, cardiomyopathy, peripheral neuropathy, mouth sores, weight loss, pancytopenia, life-threatening infections, need for transfusions of blood products, nausea, vomiting, change in bowel habits, blood clots, admission to hospital for various reasons, and risks of death.   Long term side-effects are also discussed including risks of infertility, permanent damage to nerve function, chronic fatigue, and rare secondary malignancy including bone marrow disorders and leukemia.   The patient is aware that the response rates discussed earlier is not guaranteed.  After a long discussion, patient made an informed decision to proceed with the prescribed plan of care.  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 Lactate dehydrogenase    Standing Status:   Standing    Number of Occurrences:   3    Standing Expiration Date:   09/15/2021  . Sedimentation rate    Standing Status:   Standing    Number of Occurrences:   3    Standing Expiration Date:   09/15/2021  . Uric acid    Standing Status:   Standing    Number of Occurrences:   3    Standing Expiration Date:   09/15/2021  . HIV antibody (with reflex)    Standing Status:   Future    Standing Expiration Date:   09/15/2021    All questions were answered. The patient knows to call the clinic with any problems, questions or concerns. The total time spent in the appointment was 40 minutes encounter with patients including review of chart and various tests results, discussions about plan of care and coordination of care plan   Artis Delay, MD 09/15/2020 3:16 PM  INTERVAL HISTORY: Please see below for problem oriented charting. 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 Extensive hypermetabolic lymphadenopathy involving the neck, chest and abdomen as detailed above ([place]auville 5). I do not see any pelvic disease. 2. Possible involvement of the spleen. 3. Osseous involvement is also noted.     09/03/2020 Pathology Results   A.  LYMPH NODE, LEFT NECK, EXCISION:  -Lymphoproliferative disorder consistent with  classical Hodgkin lymphoma -See comment   COMMENT:   The sections show effacement of the lymph nodal architecture by a primarily nodular lymphoproliferative process.  In the more cellular areas, the nodules are composed of a mixture of small lymphocytes, plasma cells, abundant histiocytes in addition to atypical large mononuclear and multi-lobated lymphoid appearing cells with variably prominent nucleoli.  The nodules are surrounded by dense collagenous fibrosis.  In the less cellular areas, there appears to be a fibroblastic proliferation and a much more depleted cellular appearance but scattering of large atypical lymphoid appearing cells is still seen.   Flow cytometric analysis was performed [facility] [phone]) and shows predominance of T lymphocytes with nonspecific changes including relative abundance of CD8 positive cells and reversal of the CD4: CD8 ratio.  No monoclonal B-cell population identified.  A battery of immunohistochemical stains was performed and shows that the large atypical lymphoid appearing cells are positive for CD30, Mum-1, PAX 5 (weak), CD23 in addition to variable but generally weak positivity for CD79a and CD20.  Only scattered large atypical lymphoid cells show weak Golgi positivity for CD15.  The large atypical lymphoid cells are negative for LCA, CD10, CD3, CD5, EMA, ALK protein and EBV in situ  hybridization.  The small lymphoid cells in the background show a mixture of T and B cells with predominance of T cells.  T cells show a mixture of CD4 and CD8 positive cells with slight predominance of the latter.  The overall findings are most consistent with classical Hodgkin lymphoma, nodular sclerosis subtype.      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[place])   09/29/2020 -  Chemotherapy    Patient is on Treatment Plan: HODGKINS LYMPHOMA  A + AVD Q28D        REVIEW OF SYSTEMS:   Constitutional: Denies  fevers, chills or abnormal weight loss Eyes: Denies blurriness of vision Ears, nose, mouth, throat, and face: Denies mucositis or sore throat Respiratory: Denies cough, dyspnea or wheezes Cardiovascular: Denies palpitation, chest discomfort or lower extremity swelling Gastrointestinal:  Denies nausea, heartburn or change in bowel habits Skin: Denies abnormal skin rashes Lymphatics: Denies new lymphadenopathy or easy bruising Neurological:Denies numbness, tingling or new weaknesses Behavioral/Psych: Mood is stable, no new changes  All other systems were reviewed with the patient and are negative.  I have reviewed the past medical history, past surgical history, social history and family history with the patient and they are unchanged from previous note.  ALLERGIES:  is allergic to codeine.  MEDICATIONS:  Current Outpatient Medications  Medication Sig Dispense Refill  . acyclovir (ZOVIRAX) 400 MG tablet Take 1 tablet (400 mg total) by mouth 2 (two) times daily. 60 tablet 6  . allopurinol (ZYLOPRIM) 300 MG tablet Take 1 tablet (300 mg total) by mouth daily. 30 tablet 1  . albuterol (VENTOLIN HFA) 108 (90 Base) MCG/ACT inhaler Inhale 2 puffs into the lungs every 6 (six) hours as needed for wheezing or shortness of breath. 8 g 0  . aspirin EC 81 MG tablet Take 81 mg by mouth daily.    Marland Kitchen atorvastatin (LIPITOR) 20 MG tablet TAKE 1 TABLET BY MOUTH ONCE DAILY 90 tablet 3  . BREO ELLIPTA 100-25 MCG/INH AEPB INHALE 1 PUFF INTO THE LUNGS DAILY 60 each 5  . cetirizine (ZYRTEC) 10 MG tablet TAKE 1 TABLET BY MOUTH ONCE DAILY 90 tablet 2  .  denosumab (PROLIA) 60 MG/ML SOSY injection Inject 60 mg into the skin every 6 (six) months.    Marland Kitchen glucose blood (ACCU-CHEK AVIVA PLUS) test strip Use to Monitor blood sugars daily 100 each 5  . hydrochlorothiazide (MICROZIDE) 12.5 MG capsule Take 1 capsule (12.5 mg total) by mouth daily. 90 capsule 3  . Ibuprofen 200 MG CAPS Take 1 capsule by mouth daily.    Marland Kitchen  lidocaine-prilocaine (EMLA) cream Apply to affected area once 30 g 3  . losartan (COZAAR) 100 MG tablet TAKE 1 TABLET BY MOUTH ONCE DAILY 90 tablet 2  . metoprolol succinate (TOPROL-XL) 50 MG 24 hr tablet TAKE 1 TABLET BY MOUTH ONCE A DAY WITH OR IMMEDIATELY FOLLOWING A MEAL 90 tablet 2  . Multiple Vitamin (MULTIVITAMIN) capsule Take 1 capsule by mouth daily.    Marland Kitchen omeprazole (PRILOSEC) 20 MG capsule TAKE 1 CAPSULE BY MOUTH ONCE DAILY 90 capsule 3  . ondansetron (ZOFRAN) 8 MG tablet Take 1 tablet (8 mg total) by mouth every 8 (eight) hours as needed. 30 tablet 1  . prochlorperazine (COMPAZINE) 10 MG tablet Take 1 tablet (10 mg total) by mouth every 6 (six) hours as needed (Nausea or vomiting). 30 tablet 1   Current Facility-Administered Medications  Medication Dose Route Frequency Provider Last Rate Last Admin  . denosumab (PROLIA) injection 60 mg  60 mg Subcutaneous Q6 months [place], Lonell Grandchild F, MD   60 mg at 08/10/20 1000    PHYSICAL EXAMINATION: ECOG PERFORMANCE STATUS: 1 - Symptomatic but completely ambulatory  Vitals:   09/15/20 1403  BP: (!) 144/61  Pulse: 100  Resp: 19  Temp: 98 F (36.7 C)  SpO2: 100%   Filed Weights   09/15/20 1403  Weight: 146 lb 12.8 oz (66.6 kg)    GENERAL:alert, no distress and comfortable NEURO: alert & oriented x 3 with fluent speech, no focal motor/sensory deficits  LABORATORY DATA:  I have reviewed the data as listed    Component Value Date/Time   NA 135 08/31/2020 1030   NA 143 08/15/2016 1037   K 4.0 08/31/2020 1030   K 4.8 08/15/2016 1037   CL 100 08/31/2020 1030   CL 103 07/16/2012 1304   CO2 26 08/31/2020 1030   CO2 27 08/15/2016 1037   GLUCOSE 126 (H) 08/31/2020 1030   GLUCOSE 111 08/15/2016 1037   GLUCOSE 112 (H) 07/16/2012 1304   BUN 14 08/31/2020 1030   BUN 12.7 08/15/2016 1037   CREATININE 0.98 08/31/2020 1030   CREATININE 0.95 (H) 08/10/2020 1041   CREATININE 0.9 08/15/2016 1037   CALCIUM 9.4 08/31/2020 1030   CALCIUM  10.3 08/13/2020 1117   CALCIUM 10.5 (H) 08/15/2016 1037   PROT 7.3 08/10/2020 1041   PROT 7.9 08/15/2016 1037   ALBUMIN 4.4 09/19/2017 0935   ALBUMIN 4.5 08/15/2016 1037   AST 15 08/10/2020 1041   AST 30 08/15/2016 1037   ALT 9 08/10/2020 1041   ALT 29 08/15/2016 1037   ALKPHOS 64 09/19/2017 0935   ALKPHOS 67 08/15/2016 1037   BILITOT 0.7 08/10/2020 1041   BILITOT 0.80 08/15/2016 1037   GFRNONAA >60 08/31/2020 1030   GFRNONAA 62 12/05/2017 1000   GFRAA 72 12/05/2017 1000    No results found for: SPEP, UPEP  Lab Results  Component Value Date   WBC 9.7 08/10/2020   NEUTROABS 7,256 08/10/2020   HGB 12.4 08/10/2020   HCT 38.5 08/10/2020   MCV 82.8 08/10/2020   PLT 463 (H) 08/10/2020  Chemistry      Component Value Date/Time   NA 135 08/31/2020 1030   NA 143 08/15/2016 1037   K 4.0 08/31/2020 1030   K 4.8 08/15/2016 1037   CL 100 08/31/2020 1030   CL 103 07/16/2012 1304   CO2 26 08/31/2020 1030   CO2 27 08/15/2016 1037   BUN 14 08/31/2020 1030   BUN 12.7 08/15/2016 1037   CREATININE 0.98 08/31/2020 1030   CREATININE 0.95 (H) 08/10/2020 1041   CREATININE 0.9 08/15/2016 1037      Component Value Date/Time   CALCIUM 9.4 08/31/2020 1030   CALCIUM 10.3 08/13/2020 1117   CALCIUM 10.5 (H) 08/15/2016 1037   ALKPHOS 64 09/19/2017 0935   ALKPHOS 67 08/15/2016 1037   AST 15 08/10/2020 1041   AST 30 08/15/2016 1037   ALT 9 08/10/2020 1041   ALT 29 08/15/2016 1037   BILITOT 0.7 08/10/2020 1041   BILITOT 0.80 08/15/2016 1037       RADIOGRAPHIC STUDIES: I have personally reviewed the radiological images as listed and agreed with the findings in the report. NM PET Image Initial (PI) Skull Base To Thigh  Result Date: 09/01/2020 CLINICAL DATA:  Initial treatment strategy for lymphadenopathy. EXAM: NUCLEAR MEDICINE PET SKULL BASE TO THIGH TECHNIQUE: 8.06 mCi F-18 FDG was injected intravenously. Full-ring PET imaging was performed from the skull base to thigh after the  radiotracer. CT data was obtained and used for attenuation correction and anatomic localization. Fasting blood glucose: 141 mg/dl COMPARISON:  Neck and chest CT 08/13/2020 FINDINGS: Mediastinal blood pool activity: SUV max 1.59 Liver activity: SUV max 2.66 NECK: Bilateral hypermetabolic neck adenopathy as demonstrated on the CT scan. Enlarged lymph nodes an or adjacent to the posterior aspect of the left parotid gland are hypermetabolic with SUV max of 2.99. 9 mm right level 2 lymph node has an SUV max of 5.86. 17 mm left-sided node just deep to the sternocleidomastoid muscle on image 42/4 has an SUV max of 9.80. Multiple other hypermetabolic neck nodes are noted bilaterally. Incidental CT findings: none CHEST: Extensive hypermetabolic adenopathy the involving the chest. There are bilateral supraclavicular and axillary lymph nodes and extensive mediastinal and hilar adenopathy. Left axillary node has an SUV max of 14.80. Subcarinal nodal mass has an SUV max of 13.39 Right hilar/mediastinal nodal mass has an SUV max of 12.60. No worrisome pulmonary nodules. No breast masses are identified. Incidental CT findings: none ABDOMEN/PELVIS: Extensive hypermetabolic adenopathy the the abdomen. Periportal nodal mass has an SUV max of 9.79. Retroperitoneal nodal mass has an SUV max of 10.20 No pelvic or inguinal adenopathy. The spleen is normal in size. Small focus of hypermetabolism in the spleen could be a splenic lesion. SUV max is 3.92. Incidental CT findings: Vascular calcifications are noted. No aneurysm. There is a benign-appearing 4 cm cystic lesion in the left pelvis. No hypermetabolism. This is likely a benign peritoneal inclusion cyst. A postoperative seroma is also possible. SKELETON: There is a hypermetabolic lesion involving the right lower lobe which has an SUV max of 4.59. I do not see an actual pulmonary nodule or pleural lesion. It may be misregistration from a right rib lesion. There is also a E small  hypermetabolic lesion in the left seventh rib posteriorly with SUV max of 3.72. There is also a lesion in the subtrochanteric right femur worrisome for lymphomatous involvement. The SUV max is 6.75 Incidental CT findings: none IMPRESSION: 1. Extensive hypermetabolic lymphadenopathy involving the neck, chest and abdomen as detailed  above (Deauville 5). I do not see any pelvic disease. 2. Possible involvement of the spleen. 3. Osseous involvement is also noted. Electronically Signed   By: Marijo Sanes M.D.   On: 09/01/2020 17:15

## 2020-09-17 ENCOUNTER — Other Ambulatory Visit: Payer: Self-pay | Admitting: Radiology

## 2020-09-17 ENCOUNTER — Other Ambulatory Visit: Payer: Self-pay

## 2020-09-17 ENCOUNTER — Inpatient Hospital Stay: Payer: Medicare PPO

## 2020-09-21 ENCOUNTER — Other Ambulatory Visit: Payer: Self-pay | Admitting: Hematology and Oncology

## 2020-09-21 ENCOUNTER — Other Ambulatory Visit: Payer: Self-pay

## 2020-09-21 ENCOUNTER — Inpatient Hospital Stay: Payer: Medicare PPO

## 2020-09-21 ENCOUNTER — Ambulatory Visit (HOSPITAL_COMMUNITY)
Admission: RE | Admit: 2020-09-21 | Discharge: 2020-09-21 | Disposition: A | Discharge status: Home / Self Care | Payer: Medicare PPO | Source: Ambulatory Visit | Attending: Hematology and Oncology | Admitting: Hematology and Oncology

## 2020-09-21 DIAGNOSIS — Z8051 Family history of malignant neoplasm of kidney: Secondary | ICD-10-CM | POA: Diagnosis not present

## 2020-09-21 DIAGNOSIS — Z7982 Long term (current) use of aspirin: Secondary | ICD-10-CM | POA: Diagnosis not present

## 2020-09-21 DIAGNOSIS — Z803 Family history of malignant neoplasm of breast: Secondary | ICD-10-CM | POA: Diagnosis not present

## 2020-09-21 DIAGNOSIS — Z800 Family history of malignant neoplasm of digestive organs: Secondary | ICD-10-CM | POA: Insufficient documentation

## 2020-09-21 DIAGNOSIS — Z885 Allergy status to narcotic agent status: Secondary | ICD-10-CM | POA: Diagnosis not present

## 2020-09-21 DIAGNOSIS — Z9049 Acquired absence of other specified parts of digestive tract: Secondary | ICD-10-CM | POA: Insufficient documentation

## 2020-09-21 DIAGNOSIS — Z833 Family history of diabetes mellitus: Secondary | ICD-10-CM | POA: Diagnosis not present

## 2020-09-21 DIAGNOSIS — Z96652 Presence of left artificial knee joint: Secondary | ICD-10-CM | POA: Insufficient documentation

## 2020-09-21 DIAGNOSIS — C8190 Hodgkin lymphoma, unspecified, unspecified site: Secondary | ICD-10-CM

## 2020-09-21 DIAGNOSIS — E785 Hyperlipidemia, unspecified: Secondary | ICD-10-CM | POA: Insufficient documentation

## 2020-09-21 DIAGNOSIS — R7303 Prediabetes: Secondary | ICD-10-CM | POA: Diagnosis not present

## 2020-09-21 DIAGNOSIS — C8118 Nodular sclerosis classical Hodgkin lymphoma, lymph nodes of multiple sites: Secondary | ICD-10-CM

## 2020-09-21 DIAGNOSIS — Z8249 Family history of ischemic heart disease and other diseases of the circulatory system: Secondary | ICD-10-CM | POA: Diagnosis not present

## 2020-09-21 DIAGNOSIS — Z79899 Other long term (current) drug therapy: Secondary | ICD-10-CM | POA: Insufficient documentation

## 2020-09-21 DIAGNOSIS — Z5111 Encounter for antineoplastic chemotherapy: Secondary | ICD-10-CM | POA: Diagnosis not present

## 2020-09-21 DIAGNOSIS — Z8041 Family history of malignant neoplasm of ovary: Secondary | ICD-10-CM | POA: Diagnosis not present

## 2020-09-21 DIAGNOSIS — I10 Essential (primary) hypertension: Secondary | ICD-10-CM | POA: Diagnosis not present

## 2020-09-21 HISTORY — PX: IR IMAGING GUIDED PORT INSERTION: IMG5740

## 2020-09-21 LAB — CBC WITH DIFFERENTIAL (CANCER CENTER ONLY)
Abs Immature Granulocytes: 0.04 10*3/uL (ref 0.00–0.07)
Basophils Absolute: 0 10*3/uL (ref 0.0–0.1)
Basophils Relative: 1 %
Eosinophils Absolute: 0.1 10*3/uL (ref 0.0–0.5)
Eosinophils Relative: 1 %
HCT: 33.5 % — ABNORMAL LOW (ref 36.0–46.0)
Hemoglobin: 11 g/dL — ABNORMAL LOW (ref 12.0–15.0)
Immature Granulocytes: 1 %
Lymphocytes Relative: 13 %
Lymphs Abs: 1.1 10*3/uL (ref 0.7–4.0)
MCH: 27.4 pg (ref 26.0–34.0)
MCHC: 32.8 g/dL (ref 30.0–36.0)
MCV: 83.5 fL (ref 80.0–100.0)
Monocytes Absolute: 0.7 10*3/uL (ref 0.1–1.0)
Monocytes Relative: 9 %
Neutro Abs: 6.2 10*3/uL (ref 1.7–7.7)
Neutrophils Relative %: 75 %
Platelet Count: 316 10*3/uL (ref 150–400)
RBC: 4.01 MIL/uL (ref 3.87–5.11)
RDW: 14.1 % (ref 11.5–15.5)
WBC Count: 8.2 10*3/uL (ref 4.0–10.5)
nRBC: 0 % (ref 0.0–0.2)

## 2020-09-21 LAB — CMP (CANCER CENTER ONLY)
ALT: 11 U/L (ref 0–44)
AST: 19 U/L (ref 15–41)
Albumin: 3.5 g/dL (ref 3.5–5.0)
Alkaline Phosphatase: 76 U/L (ref 38–126)
Anion gap: 12 (ref 5–15)
BUN: 10 mg/dL (ref 8–23)
CO2: 26 mmol/L (ref 22–32)
Calcium: 10.2 mg/dL (ref 8.9–10.3)
Chloride: 102 mmol/L (ref 98–111)
Creatinine: 0.8 mg/dL (ref 0.44–1.00)
GFR, Estimated: >60 mL/min (ref >60)
Glucose, Bld: 120 mg/dL — ABNORMAL HIGH (ref 70–99)
Potassium: 3.8 mmol/L (ref 3.5–5.1)
Sodium: 140 mmol/L (ref 135–145)
Total Bilirubin: 0.5 mg/dL (ref 0.3–1.2)
Total Protein: 6.9 g/dL (ref 6.5–8.1)

## 2020-09-21 LAB — PROTIME-INR
INR: 1 (ref 0.8–1.2)
Prothrombin Time: 12.5 seconds (ref 11.4–15.2)

## 2020-09-21 LAB — LACTATE DEHYDROGENASE: LDH: 159 U/L (ref 98–192)

## 2020-09-21 LAB — SEDIMENTATION RATE: Sed Rate: 44 mm/hr — ABNORMAL HIGH (ref 0–22)

## 2020-09-21 LAB — URIC ACID: Uric Acid, Serum: 5.4 mg/dL (ref 2.5–7.1)

## 2020-09-21 LAB — HIV ANTIBODY (ROUTINE TESTING W REFLEX): HIV Screen 4th Generation wRfx: NONREACTIVE

## 2020-09-21 MED ORDER — LIDOCAINE-EPINEPHRINE 1 %-1:100000 IJ SOLN
INTRAMUSCULAR | Status: AC
  Filled 2020-09-21: qty 1

## 2020-09-21 MED ORDER — LIDOCAINE HCL 1 % IJ SOLN
INTRAMUSCULAR | Status: AC
  Filled 2020-09-21: qty 20

## 2020-09-21 MED ORDER — HEPARIN SOD (PORK) LOCK FLUSH 100 UNIT/ML IV SOLN
INTRAVENOUS | Status: AC
  Filled 2020-09-21: qty 5

## 2020-09-21 MED ORDER — FENTANYL CITRATE (PF) 100 MCG/2ML IJ SOLN
INTRAMUSCULAR | Status: AC | PRN
  Administered 2020-09-21: 25 ug via INTRAVENOUS
  Administered 2020-09-21: 50 ug via INTRAVENOUS
  Administered 2020-09-21: 25 ug via INTRAVENOUS

## 2020-09-21 MED ORDER — MIDAZOLAM HCL 2 MG/2ML IJ SOLN
INTRAMUSCULAR | Status: AC
  Filled 2020-09-21: qty 4

## 2020-09-21 MED ORDER — SODIUM CHLORIDE 0.9 % IV SOLN: INTRAVENOUS | Status: DC

## 2020-09-21 MED ORDER — MIDAZOLAM HCL 2 MG/2ML IJ SOLN
INTRAMUSCULAR | Status: AC | PRN
  Administered 2020-09-21 (×2): 0.5 mg via INTRAVENOUS
  Administered 2020-09-21: 1 mg via INTRAVENOUS

## 2020-09-21 MED ORDER — LIDOCAINE HCL (PF) 1 % IJ SOLN
INTRAMUSCULAR | Status: AC | PRN
  Administered 2020-09-21: 5 mL

## 2020-09-21 MED ORDER — FENTANYL CITRATE (PF) 100 MCG/2ML IJ SOLN
INTRAMUSCULAR | Status: AC
  Filled 2020-09-21: qty 2

## 2020-09-21 NOTE — Consult Note (Signed)
Chief Complaint: "I'm here for a port a cath"  Referring Physician(s): [name],Ni  Supervising Physician: Markus Daft  Patient Status: [facility] - Out-pt  History of Present Illness: Cathy [name] is a 75 y.o. female with hx of newly diagnosed Hodgkin's lymphoma who presents today for port a cath placement for chemotherapy. Additional med hx as below.   Past Medical History:  Diagnosis Date  . Abnormal glucose   . Allergy   . Asthma    Allergy induced  . Benign breast cyst in female   . GERD (gastroesophageal reflux disease)   . Hilar lymphadenopathy 08/01/2011  . History of nuclear stress test 2009   Treadmill and Stress Myoview- no CAD  . Hyperlipidemia   . Hypertension   . Lymphadenopathy of left cervical region 08/01/2011  . Nephrolithiasis 1984  . Osteopenia   . PONV (postoperative nausea and vomiting)   . Post-menopause   . Pre-diabetes    borderline  . 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 Vision changes     Past Surgical History:  Procedure Laterality Date  . CHOLECYSTECTOMY    . LITHOTRIPSY  1984  . LYMPH NODE BIOPSY    . MASS BIOPSY Left 09/03/2020   Procedure: LEFT NECK JUGULAR NODE;  Surgeon: Rozetta Nunnery, MD;  Location: [place];  Service: ENT;  Laterality: Left;  . 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   Procedure: LEFT TOTAL KNEE ARTHROPLASTY;  Surgeon: Gaynelle Arabian, MD;  Location: [facility];  Service: Orthopedics;  Laterality: Left;    Allergies: Codeine  Medications: Prior to Admission medications   Medication Sig Start Date End Date Taking? Authorizing Provider  acyclovir (ZOVIRAX) 400 MG tablet Take 1 tablet (400 mg total) by mouth 2 (two) times daily. 09/15/20   Heath Lark, MD  albuterol (VENTOLIN HFA) 108 (90 Base) MCG/ACT inhaler Inhale 2 puffs into the lungs every 6 (six) hours as needed for wheezing or shortness of breath. 07/10/20   Alycia Rossetti, MD  allopurinol (ZYLOPRIM) 300 MG tablet Take 1  tablet (300 mg total) by mouth daily. 09/15/20   Heath Lark, MD  aspirin EC 81 MG tablet Take 81 mg by mouth daily.    [provider]  atorvastatin (LIPITOR) 20 MG tablet TAKE 1 TABLET BY MOUTH ONCE DAILY 05/12/20   Addy, Modena Nunnery, MD  BREO ELLIPTA 100-25 MCG/INH AEPB INHALE 1 PUFF INTO THE LUNGS DAILY 04/03/20   Susy Frizzle, MD  cetirizine (ZYRTEC) 10 MG tablet TAKE 1 TABLET BY MOUTH ONCE DAILY 11/13/19   Alycia Rossetti, MD  denosumab (PROLIA) 60 MG/ML SOSY injection Inject 60 mg into the skin every 6 (six) months.    [provider]  glucose blood (ACCU-CHEK AVIVA PLUS) test strip Use to Monitor blood sugars daily 07/22/20   Alycia Rossetti, MD  hydrochlorothiazide (MICROZIDE) 12.5 MG capsule Take 1 capsule (12.5 mg total) by mouth daily. 09/03/19   Josue Hector, MD  Ibuprofen 200 MG CAPS Take 1 capsule by mouth daily.    [provider]  lidocaine-prilocaine (EMLA) cream Apply to affected area once 09/15/20   Heath Lark, MD  losartan (COZAAR) 100 MG tablet TAKE 1 TABLET BY MOUTH ONCE DAILY 02/14/20   Alycia Rossetti, MD  metoprolol succinate (TOPROL-XL) 50 MG 24 hr tablet TAKE 1 TABLET BY MOUTH ONCE A DAY WITH OR IMMEDIATELY FOLLOWING A MEAL 02/14/20   Alycia Rossetti, MD  Multiple Vitamin (MULTIVITAMIN) capsule  Take 1 capsule by mouth daily.    [provider]  omeprazole (PRILOSEC) 20 MG capsule TAKE 1 CAPSULE BY MOUTH ONCE DAILY 05/04/20   [place], Modena Nunnery, MD  ondansetron (ZOFRAN) 8 MG tablet Take 1 tablet (8 mg total) by mouth every 8 (eight) hours as needed. 09/15/20   Heath Lark, MD  prochlorperazine (COMPAZINE) 10 MG tablet Take 1 tablet (10 mg total) by mouth every 6 (six) hours as needed (Nausea or vomiting). 09/15/20   Heath Lark, MD     Family History  Problem Relation Age of Onset  . Hypertension Mother   . Stroke Mother   . Heart attack Mother        CABG, 5 Stents  . Dementia Mother   . Cancer Father        Kidney  . Diabetes  Brother        Borderline  . Alcohol abuse Brother   . Asthma Sister   . Cancer Maternal Aunt        stomach ca  . Cancer Maternal Aunt        ovarian ca  . Breast cancer Cousin     Social History   Socioeconomic History  . Marital status: Widowed    Spouse name: Not on file  . Number of children: Not on file  . Years of education: Not on file  . Highest education level: Not on file  Occupational History  . Not on file  Tobacco Use  . Smoking status: Never Smoker  . Smokeless tobacco: Never Used  Substance and Sexual Activity  . Alcohol use: No  . Drug use: No  . Sexual activity: Not Currently    Birth control/protection: Post-menopausal  Other Topics Concern  . Not on file  Social History Narrative  . Not on file   Social Determinants of Health   Financial Resource Strain: Not on file  Food Insecurity: Not on file  Transportation Needs: Not on file  Physical Activity: Not on file  Stress: Not on file  Social Connections: Not on file      Review of Systems: denies fever,HA,CP,dyspnea, cough, abd/back pain,N/V or bleeding  Vital Signs: BP (!) 168/75   Pulse 89   Temp 98 F (36.7 C) (Oral)   Resp 18   Ht 5\' 3"  (1.6 m)   Wt 146 lb 12.8 oz (66.6 kg)   SpO2 100%   BMI 26.00 kg/m   Physical Exam awake/alert; chest- CTA bilat; heart- RRR; abd- soft,+BS,NT; trace prtetibial edema bilat  Imaging: NM PET Image Initial (PI) Skull Base To Thigh  Result Date: 09/01/2020 CLINICAL DATA:  Initial treatment strategy for lymphadenopathy. EXAM: NUCLEAR MEDICINE PET SKULL BASE TO THIGH TECHNIQUE: 8.06 mCi F-18 FDG was injected intravenously. Full-ring PET imaging was performed from the skull base to thigh after the radiotracer. CT data was obtained and used for attenuation correction and anatomic localization. Fasting blood glucose: 141 mg/dl COMPARISON:  Neck and chest CT 08/13/2020 FINDINGS: Mediastinal blood pool activity: SUV max 1.59 Liver activity: SUV max 2.66  NECK: Bilateral hypermetabolic neck adenopathy as demonstrated on the CT scan. Enlarged lymph nodes an or adjacent to the posterior aspect of the left parotid gland are hypermetabolic with SUV max of 8.93. 9 mm right level 2 lymph node has an SUV max of 5.86. 17 mm left-sided node just deep to the sternocleidomastoid muscle on image 42/4 has an SUV max of 9.80. Multiple other hypermetabolic neck nodes are noted bilaterally. Incidental CT  findings: none CHEST: Extensive hypermetabolic adenopathy the involving the chest. There are bilateral supraclavicular and axillary lymph nodes and extensive mediastinal and hilar adenopathy. Left axillary node has an SUV max of 14.80. Subcarinal nodal mass has an SUV max of 13.39 Right hilar/mediastinal nodal mass has an SUV max of 12.60. No worrisome pulmonary nodules. No breast masses are identified. Incidental CT findings: none ABDOMEN/PELVIS: Extensive hypermetabolic adenopathy the the abdomen. Periportal nodal mass has an SUV max of 9.79. Retroperitoneal nodal mass has an SUV max of 10.20 No pelvic or inguinal adenopathy. The spleen is normal in size. Small focus of hypermetabolism in the spleen could be a splenic lesion. SUV max is 3.92. Incidental CT findings: Vascular calcifications are noted. No aneurysm. There is a benign-appearing 4 cm cystic lesion in the left pelvis. No hypermetabolism. This is likely a benign peritoneal inclusion cyst. A postoperative seroma is also possible. SKELETON: There is a hypermetabolic lesion involving the right lower lobe which has an SUV max of 4.59. I do not see an actual pulmonary nodule or pleural lesion. It may be misregistration from a right rib lesion. There is also a E small hypermetabolic lesion in the left seventh rib posteriorly with SUV max of 3.72. There is also a lesion in the subtrochanteric right femur worrisome for lymphomatous involvement. The SUV max is 6.75 Incidental CT findings: none IMPRESSION: 1. Extensive  hypermetabolic lymphadenopathy involving the neck, chest and abdomen as detailed above ([place] 5). I do not see any pelvic disease. 2. Possible involvement of the spleen. 3. Osseous involvement is also noted. Electronically Signed   By: Marijo Sanes M.D.   On: 09/01/2020 17:15   MM 3D SCREEN BREAST BILATERAL  Result Date: 09/16/2020 CLINICAL DATA:  Screening. Recently diagnosed with lymphoma. EXAM: DIGITAL SCREENING BILATERAL MAMMOGRAM WITH TOMOSYNTHESIS AND CAD TECHNIQUE: Bilateral screening digital craniocaudal and mediolateral oblique mammograms were obtained. Bilateral screening digital breast tomosynthesis was performed. The images were evaluated with computer-aided detection. COMPARISON:  Previous exam(s). ACR Breast Density Category c: The breast tissue is heterogeneously dense, which may obscure small masses. FINDINGS: There are no findings suspicious for malignancy within either breast. There is an enlarged/morphologically abnormal lymph node in the lower LEFT axilla corresponding to patient's history of recently diagnosed lymphoma and corresponding to hypermetabolic adenopathy described on PET-CT report of 09/01/2020. The images were evaluated with computer-aided detection. IMPRESSION: No mammographic evidence of malignancy within either breast. A result letter of this screening mammogram will be mailed directly to the patient. RECOMMENDATION: Screening mammogram in one year. (Code:SM-B-01Y) BI-RADS CATEGORY  1: Negative. Electronically Signed   By: Franki Cabot M.D.   On: 09/16/2020 13:12    Labs:  CBC: Recent Labs    01/13/20 1649 08/10/20 1041 09/21/20 0914  WBC 6.9 9.7 8.2  HGB 12.8 12.4 11.0*  HCT 40.2 38.5 33.5*  PLT 350 463* 316    COAGS: No results for input(s): INR, APTT in the last 8760 hours.  BMP: Recent Labs    08/10/20 1041 08/13/20 1117 08/31/20 1030 09/21/20 0914  NA 139 133* 135 140  K 4.5 3.8 4.0 3.8  CL 98 97* 100 102  CO2 29 25 26 26   GLUCOSE 111*  114* 126* 120*  BUN 17 17 14 10   CALCIUM 11.1* 9.8  10.3 9.4 10.2  CREATININE 0.95* 1.02* 0.98 0.80  GFRNONAA  --  58* >60 >60    LIVER FUNCTION TESTS: Recent Labs    01/13/20 1649 08/10/20 1041 09/21/20 0914  BILITOT  0.7 0.7 0.5  AST 19 15 19   ALT 11 9 11   ALKPHOS  --   --  76  PROT 7.7 7.3 6.9  ALBUMIN  --   --  3.5    TUMOR MARKERS: No results for input(s): AFPTM, CEA, CA199, CHROMGRNA in the last 8760 hours.  Assessment and Plan: 75 y.o. female with hx of asthma, GERD, HTN, HLD, nephrolithiasis, and newly diagnosed Hodgkin's lymphoma who presents today for port a cath placement for chemotherapy.Risks and benefits of image guided port-a-catheter placement was discussed with the patient including, but not limited to bleeding, infection, pneumothorax, or fibrin sheath development and need for additional procedures.  All of the patient's questions were answered, patient is agreeable to proceed. Consent signed and in chart.     Thank you for this interesting consult.  I greatly enjoyed meeting Cathy [name] and look forward to participating in their care.  A copy of this report was sent to the requesting provider on this date.  Electronically Signed: D. Rowe Robert, PA-C 09/21/2020, 10:24 AM   I spent a total of  25 minutes   in face to face in clinical consultation, greater than 50% of which was counseling/coordinating care for port a cath placement

## 2020-09-21 NOTE — Procedures (Signed)
Interventional Radiology Procedure:   Indications: Hodgkin Lymphoma   Procedure: Port placement  Findings: Right jugular port, tip at SVC/RA junction  Complications: None     EBL: Minimal, less than 10 ml  Plan: Discharge in one hour.  Keep port site and incisions dry for at least 24 hours.     Blaine Guiffre R. Anselm Pancoast, MD  Pager: [phone]

## 2020-09-21 NOTE — Discharge Instructions (Signed)
Interventional radiology phone numbers [phone] After hours [phone]    You have skin glue (dermabond) over your new port. Do not use the lidocaine cream (EMLA cream) over the skin glue until it has healed. The petroleum in the lidocaine cream will dissolve the skin glue resulting in an infection of your new port. Use ice in a zip lock bag for 1-2 minutes over your new port before the cancer center nurses access your port.   Implanted Port Insertion, Care After This sheet gives you information about how to care for yourself after your procedure. Your health care provider may also give you more specific instructions. If you have problems or questions, contact your health care provider. What can I expect after the procedure? After the procedure, it is common to have:  Discomfort at the port insertion site.  Bruising on the skin over the port. This should improve over 3-4 days. Follow these instructions at home: Port care  After your port is placed, you will get a manufacturer's information card. The card has information about your port. Keep this card with you at all times.  Take care of the port as told by your health care provider. Ask your health care provider if you or a family member can get training for taking care of the port at home. A home health care nurse may also take care of the port.  Make sure to remember what type of port you have. Incision care 1. Follow instructions from your health care provider about how to take care of your port insertion site. Make sure you: ? Wash your hands with soap and water before and after you change your bandage (dressing). If soap and water are not available, use hand sanitizer. ? Change your dressing as told by your health care provider. 2. Leave skin glue strips in place. These skin closures may need to stay in place for 2 weeks or longer.  3. Check your port insertion site every day for signs of infection. Check for: ? Redness,  swelling, or pain. ? Fluid or blood. ? Warmth. ? Pus or a bad smell.      Activity  Return to your normal activities as told by your health care provider. Ask your health care provider what activities are safe for you.  Do not lift anything that is heavier than 10 lb (4.5 kg), or the limit that you are told, until your health care provider says that it is safe. General instructions  Take over-the-counter and prescription medicines only as told by your health care provider.  Do not take baths, swim, or use a hot tub until your health care provider approves.You may remove your dressing tomorrow and shower.  Do not drive for 24 hours if you were given a sedative during your procedure.  Wear a medical alert bracelet in case of an emergency. This will tell any health care providers that you have a port.  Keep all follow-up visits as told by your health care provider. This is important. Contact a health care provider if:  You cannot flush your port with saline as directed, or you cannot draw blood from the port.  You have a fever or chills.  You have redness, swelling, or pain around your port insertion site.  You have fluid or blood coming from your port insertion site.  Your port insertion site feels warm to the touch.  You have pus or a bad smell coming from the port insertion site. Get help right away   if:  You have chest pain or shortness of breath.  You have bleeding from your port that you cannot control. Summary  Take care of the port as told by your health care provider. Keep the manufacturer's information card with you at all times.  Change your dressing as told by your health care provider.  Contact a health care provider if you have a fever or chills or if you have redness, swelling, or pain around your port insertion site.  Keep all follow-up visits as told by your health care provider. This information is not intended to replace advice given to you by your  health care provider. Make sure you discuss any questions you have with your health care provider. Document Revised: 12/26/2017 Document Reviewed: 12/26/2017 Elsevier Patient Education  2021 Elsevier Inc.    Moderate Conscious Sedation, Adult, Care After This sheet gives you information about how to care for yourself after your procedure. Your health care provider may also give you more specific instructions. If you have problems or questions, contact your health care provider. What can I expect after the procedure? After the procedure, it is common to have:  Sleepiness for several hours.  Impaired judgment for several hours.  Difficulty with balance.  Vomiting if you eat too soon. Follow these instructions at home: For the time period you were told by your health care provider:  Rest.  Do not participate in activities where you could fall or become injured.  Do not drive or use machinery.  Do not drink alcohol.  Do not take sleeping pills or medicines that cause drowsiness.  Do not make important decisions or sign legal documents.  Do not take care of children on your own.      Eating and drinking 4. Follow the diet recommended by your health care provider. 5. Drink enough fluid to keep your urine pale yellow. 6. If you vomit: ? Drink water, juice, or soup when you can drink without vomiting. ? Make sure you have little or no nausea before eating solid foods.   General instructions  Take over-the-counter and prescription medicines only as told by your health care provider.  Have a responsible adult stay with you for the time you are told. It is important to have someone help care for you until you are awake and alert.  Do not smoke.  Keep all follow-up visits as told by your health care provider. This is important. Contact a health care provider if:  You are still sleepy or having trouble with balance after 24 hours.  You feel light-headed.  You keep feeling  nauseous or you keep vomiting.  You develop a rash.  You have a fever.  You have redness or swelling around the IV site. Get help right away if:  You have trouble breathing.  You have new-onset confusion at home. Summary  After the procedure, it is common to feel sleepy, have impaired judgment, or feel nauseous if you eat too soon.  Rest after you get home. Know the things you should not do after the procedure.  Follow the diet recommended by your health care provider and drink enough fluid to keep your urine pale yellow.  Get help right away if you have trouble breathing or new-onset confusion at home. This information is not intended to replace advice given to you by your health care provider. Make sure you discuss any questions you have with your health care provider. Document Revised: 09/27/2019 Document Reviewed: 04/25/2019 Elsevier Patient Education    2021 Elsevier Inc. 

## 2020-09-22 NOTE — Progress Notes (Signed)
Pharmacist Chemotherapy Monitoring - Initial Assessment    Anticipated start date: 09/29/2020   Regimen:  . Are orders appropriate based on the patient's diagnosis, regimen, and cycle? Yes . Does the plan date match the patient's scheduled date? Yes . Is the sequencing of drugs appropriate? Yes . Are the premedications appropriate for the patient's regimen? Yes . Prior Authorization for treatment is: Pending o If applicable, is the correct biosimilar selected based on the patient's insurance? not applicable  Organ Function and Labs: Marland Kitchen Are dose adjustments needed based on the patient's renal function, hepatic function, or hematologic function? Yes . Are appropriate labs ordered prior to the start of patient's treatment? Yes . Other organ system assessment, if indicated: anthracyclines: Echo/ MUGA . The following baseline labs, if indicated, have been ordered: N/A  Dose Assessment: . Are the drug doses appropriate? Yes . Are the following correct: o Drug concentrations Yes o IV fluid compatible with drug Yes o Administration routes yes o  o Timing of therapy Yes . If applicable, does the patient have documented access for treatment and/or plans for port-a-cath placement? yes . If applicable, have lifetime cumulative doses been properly documented and assessed? not applicable Lifetime Dose Tracking  No doses have been documented on this patient for the following tracked chemicals: Doxorubicin, Epirubicin, Idarubicin, Daunorubicin, Mitoxantrone, Bleomycin, Oxaliplatin, Carboplatin, Liposomal Doxorubicin  o   Toxicity Monitoring/Prevention: . The patient has the following take home antiemetics prescribed: Ondansetron and Prochlorperazine . The patient has the following take home medications prescribed: N/A . Medication allergies and previous infusion related reactions, if applicable, have been reviewed and addressed. No . The patient's current medication list has been assessed for  drug-drug interactions with their chemotherapy regimen. no significant drug-drug interactions were identified on review.  Order Review: . Are the treatment plan orders signed? Yes . Is the patient scheduled to see a provider prior to their treatment? Yes  I verify that I have reviewed each item in the above checklist and answered each question accordingly.  Larene Beach, RPH, 09/22/2020  10:33 AM

## 2020-09-23 ENCOUNTER — Other Ambulatory Visit: Payer: Self-pay | Admitting: *Deleted

## 2020-09-23 MED ORDER — ALBUTEROL SULFATE HFA 108 (90 BASE) MCG/ACT IN AERS: 2.0000 | INHALATION_SPRAY | Freq: Four times a day (QID) | RESPIRATORY_TRACT | 0 refills | Status: DC | PRN

## 2020-09-25 ENCOUNTER — Ambulatory Visit (HOSPITAL_COMMUNITY)
Admission: RE | Admit: 2020-09-25 | Discharge: 2020-09-25 | Disposition: A | Discharge status: Home / Self Care | Payer: Medicare PPO | Source: Ambulatory Visit | Attending: Hematology and Oncology | Admitting: Hematology and Oncology

## 2020-09-25 ENCOUNTER — Other Ambulatory Visit: Payer: Self-pay

## 2020-09-25 DIAGNOSIS — K219 Gastro-esophageal reflux disease without esophagitis: Secondary | ICD-10-CM | POA: Insufficient documentation

## 2020-09-25 DIAGNOSIS — E785 Hyperlipidemia, unspecified: Secondary | ICD-10-CM | POA: Insufficient documentation

## 2020-09-25 DIAGNOSIS — I119 Hypertensive heart disease without heart failure: Secondary | ICD-10-CM | POA: Diagnosis not present

## 2020-09-25 DIAGNOSIS — Z01818 Encounter for other preprocedural examination: Secondary | ICD-10-CM | POA: Insufficient documentation

## 2020-09-25 DIAGNOSIS — Z0189 Encounter for other specified special examinations: Secondary | ICD-10-CM

## 2020-09-25 DIAGNOSIS — I313 Pericardial effusion (noninflammatory): Secondary | ICD-10-CM | POA: Insufficient documentation

## 2020-09-25 DIAGNOSIS — C8190 Hodgkin lymphoma, unspecified, unspecified site: Secondary | ICD-10-CM | POA: Insufficient documentation

## 2020-09-25 NOTE — Progress Notes (Signed)
  Echocardiogram 2D Echocardiogram has been performed.  Cathy [name] 09/25/2020, 11:35 AM

## 2020-09-28 ENCOUNTER — Other Ambulatory Visit: Payer: Self-pay | Admitting: Cardiovascular Disease

## 2020-09-29 ENCOUNTER — Inpatient Hospital Stay: Payer: Medicare PPO | Admitting: Hematology and Oncology

## 2020-09-29 ENCOUNTER — Inpatient Hospital Stay: Payer: Medicare PPO

## 2020-09-29 ENCOUNTER — Other Ambulatory Visit: Payer: Self-pay

## 2020-09-29 ENCOUNTER — Encounter: Payer: Self-pay | Admitting: Hematology and Oncology

## 2020-09-29 ENCOUNTER — Other Ambulatory Visit: Payer: Self-pay | Admitting: *Deleted

## 2020-09-29 DIAGNOSIS — G609 Hereditary and idiopathic neuropathy, unspecified: Secondary | ICD-10-CM | POA: Diagnosis not present

## 2020-09-29 DIAGNOSIS — C8118 Nodular sclerosis classical Hodgkin lymphoma, lymph nodes of multiple sites: Secondary | ICD-10-CM

## 2020-09-29 DIAGNOSIS — Z5111 Encounter for antineoplastic chemotherapy: Secondary | ICD-10-CM | POA: Diagnosis not present

## 2020-09-29 DIAGNOSIS — R590 Localized enlarged lymph nodes: Secondary | ICD-10-CM

## 2020-09-29 LAB — ECHOCARDIOGRAM COMPLETE
Area-P 1/2: 3.31 cm2
S' Lateral: 2.5 cm

## 2020-09-29 MED ORDER — SODIUM CHLORIDE 0.9 % IV SOLN
150.0000 mg | Freq: Once | INTRAVENOUS | Status: AC
  Administered 2020-09-29: 150 mg via INTRAVENOUS
  Filled 2020-09-29: qty 150

## 2020-09-29 MED ORDER — SODIUM CHLORIDE 0.9 % IV SOLN
3.0000 mg/m2 | Freq: Once | INTRAVENOUS | Status: AC
  Administered 2020-09-29: 5.1 mg via INTRAVENOUS
  Filled 2020-09-29: qty 5.1

## 2020-09-29 MED ORDER — SODIUM CHLORIDE 0.9% FLUSH
10.0000 mL | INTRAVENOUS | Status: DC | PRN
  Administered 2020-09-29: 10 mL
  Filled 2020-09-29: qty 10

## 2020-09-29 MED ORDER — SODIUM CHLORIDE 0.9 % IV SOLN
1.2000 mg/kg | Freq: Once | INTRAVENOUS | Status: AC
  Administered 2020-09-29: 80 mg via INTRAVENOUS
  Filled 2020-09-29: qty 16

## 2020-09-29 MED ORDER — ACETAMINOPHEN 325 MG PO TABS
650.0000 mg | ORAL_TABLET | Freq: Once | ORAL | Status: AC
  Administered 2020-09-29: 650 mg via ORAL

## 2020-09-29 MED ORDER — HEPARIN SOD (PORK) LOCK FLUSH 100 UNIT/ML IV SOLN
500.0000 [IU] | Freq: Once | INTRAVENOUS | Status: AC | PRN
  Administered 2020-09-29: 500 [IU]
  Filled 2020-09-29: qty 5

## 2020-09-29 MED ORDER — SODIUM CHLORIDE 0.9 % IV SOLN
Freq: Once | INTRAVENOUS | Status: AC
  Filled 2020-09-29: qty 250

## 2020-09-29 MED ORDER — ACETAMINOPHEN 325 MG PO TABS
ORAL_TABLET | ORAL | Status: AC
  Filled 2020-09-29: qty 2

## 2020-09-29 MED ORDER — PALONOSETRON HCL INJECTION 0.25 MG/5ML
INTRAVENOUS | Status: AC
  Filled 2020-09-29: qty 5

## 2020-09-29 MED ORDER — SODIUM CHLORIDE 0.9 % IV SOLN
10.0000 mg | Freq: Once | INTRAVENOUS | Status: AC
  Administered 2020-09-29: 10 mg via INTRAVENOUS
  Filled 2020-09-29: qty 10

## 2020-09-29 MED ORDER — DOXORUBICIN HCL CHEMO IV INJECTION 2 MG/ML
25.0000 mg/m2 | Freq: Once | INTRAVENOUS | Status: AC
  Administered 2020-09-29: 42 mg via INTRAVENOUS
  Filled 2020-09-29: qty 21

## 2020-09-29 MED ORDER — DIPHENHYDRAMINE HCL 50 MG/ML IJ SOLN
12.5000 mg | Freq: Once | INTRAMUSCULAR | Status: AC
  Administered 2020-09-29: 12.5 mg via INTRAVENOUS

## 2020-09-29 MED ORDER — HYDROCHLOROTHIAZIDE 12.5 MG PO CAPS
12.5000 mg | ORAL_CAPSULE | Freq: Every day | ORAL | 3 refills | Status: DC
Start: 2020-09-29 — End: 2021-07-12

## 2020-09-29 MED ORDER — SODIUM CHLORIDE 0.9 % IV SOLN
375.0000 mg/m2 | Freq: Once | INTRAVENOUS | Status: AC
  Administered 2020-09-29: 640 mg via INTRAVENOUS
  Filled 2020-09-29: qty 64

## 2020-09-29 MED ORDER — DIPHENHYDRAMINE HCL 50 MG/ML IJ SOLN
INTRAMUSCULAR | Status: AC
  Filled 2020-09-29: qty 1

## 2020-09-29 MED ORDER — PALONOSETRON HCL INJECTION 0.25 MG/5ML
0.2500 mg | Freq: Once | INTRAVENOUS | Status: AC
  Administered 2020-09-29: 0.25 mg via INTRAVENOUS

## 2020-09-29 NOTE — Assessment & Plan Note (Signed)
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

## 2020-09-29 NOTE — Assessment & Plan Note (Signed)
I have reviewed recent results of echocardiogram We will proceed with treatment as discussed I reviewed side effects with her and her son again

## 2020-09-29 NOTE — Assessment & Plan Note (Signed)
She has palpable lymphadenopathy but it does not bother her I am hopeful it will resolve by her next visit

## 2020-09-29 NOTE — Patient Instructions (Signed)
[facility] Discharge Instructions for Patients Receiving Chemotherapy  Today you received the following chemotherapy agents: Doxorubicin, Vinblastine, Dacarbazine, and Brentuximab Vedotin  To help prevent nausea and vomiting after your treatment, we encourage you to take your nausea medication as directed by your MD.   If you develop nausea and vomiting that is not controlled by your nausea medication, call the clinic.   BELOW ARE SYMPTOMS THAT SHOULD BE REPORTED IMMEDIATELY:  *FEVER GREATER THAN 100.5 F  *CHILLS WITH OR WITHOUT FEVER  NAUSEA AND VOMITING THAT IS NOT CONTROLLED WITH YOUR NAUSEA MEDICATION  *UNUSUAL SHORTNESS OF BREATH  *UNUSUAL BRUISING OR BLEEDING  TENDERNESS IN MOUTH AND THROAT WITH OR WITHOUT PRESENCE OF ULCERS  *URINARY PROBLEMS  *BOWEL PROBLEMS  UNUSUAL RASH Items with * indicate a potential emergency and should be followed up as soon as possible.  Feel free to call the clinic should you have any questions or concerns. The clinic phone number is [phone].  Please show the [facility] at check-in to the Emergency Department and triage nurse.  Doxorubicin injection What is this medicine? DOXORUBICIN (dox oh ROO bi sin) is a chemotherapy drug. It is used to treat many kinds of cancer like leukemia, lymphoma, neuroblastoma, sarcoma, and Wilms' tumor. It is also used to treat bladder cancer, breast cancer, lung cancer, ovarian cancer, stomach cancer, and thyroid cancer. This medicine may be used for other purposes; ask your health care provider or pharmacist if you have questions. COMMON BRAND NAME(S): Adriamycin, Adriamycin PFS, Adriamycin RDF, Rubex What should I tell my health care provider before I take this medicine? They need to know if you have any of these conditions:  heart disease  history of low blood counts caused by a medicine  liver disease  recent or ongoing radiation therapy  an unusual or allergic reaction  to doxorubicin, other chemotherapy agents, other medicines, foods, dyes, or preservatives  pregnant or trying to get pregnant  breast-feeding How should I use this medicine? This drug is given as an infusion into a vein. It is administered in a hospital or clinic by a specially trained health care professional. If you have pain, swelling, burning or any unusual feeling around the site of your injection, tell your health care professional right away. Talk to your pediatrician regarding the use of this medicine in children. Special care may be needed. Overdosage: If you think you have taken too much of this medicine contact a poison control center or emergency room at once. NOTE: This medicine is only for you. Do not share this medicine with others. What if I miss a dose? It is important not to miss your dose. Call your doctor or health care professional if you are unable to keep an appointment. What may interact with this medicine? This medicine may interact with the following medications:  6-mercaptopurine  paclitaxel  phenytoin  St. John's Wort  trastuzumab  verapamil This list may not describe all possible interactions. Give your health care provider a list of all the medicines, herbs, non-prescription drugs, or dietary supplements you use. Also tell them if you smoke, drink alcohol, or use illegal drugs. Some items may interact with your medicine. What should I watch for while using this medicine? This drug may make you feel generally unwell. This is not uncommon, as chemotherapy can affect healthy cells as well as cancer cells. Report any side effects. Continue your course of treatment even though you feel ill unless your doctor tells you to  stop. There is a maximum amount of this medicine you should receive throughout your life. The amount depends on the medical condition being treated and your overall health. Your doctor will watch how much of this medicine you receive in your  lifetime. Tell your doctor if you have taken this medicine before. You may need blood work done while you are taking this medicine. Your urine may turn red for a few days after your dose. This is not blood. If your urine is dark or brown, call your doctor. In some cases, you may be given additional medicines to help with side effects. Follow all directions for their use. Call your doctor or health care professional for advice if you get a fever, chills or sore throat, or other symptoms of a cold or flu. Do not treat yourself. This drug decreases your body's ability to fight infections. Try to avoid being around people who are sick. This medicine may increase your risk to bruise or bleed. Call your doctor or health care professional if you notice any unusual bleeding. Talk to your doctor about your risk of cancer. You may be more at risk for certain types of cancers if you take this medicine. Do not become pregnant while taking this medicine or for 6 months after stopping it. Women should inform their doctor if they wish to become pregnant or think they might be pregnant. Men should not father a child while taking this medicine and for 6 months after stopping it. There is a potential for serious side effects to an unborn child. Talk to your health care professional or pharmacist for more information. Do not breast-feed an infant while taking this medicine. This medicine has caused ovarian failure in some women and reduced sperm counts in some men This medicine may interfere with the ability to have a child. Talk with your doctor or health care professional if you are concerned about your fertility. This medicine may cause a decrease in Co-Enzyme Q-10. You should make sure that you get enough Co-Enzyme Q-10 while you are taking this medicine. Discuss the foods you eat and the vitamins you take with your health care professional. What side effects may I notice from receiving this medicine? Side effects that  you should report to your doctor or health care professional as soon as possible:  allergic reactions like skin rash, itching or hives, swelling of the face, lips, or tongue  breathing problems  chest pain  fast or irregular heartbeat  low blood counts - this medicine may decrease the number of white blood cells, red blood cells and platelets. You may be at increased risk for infections and bleeding.  pain, redness, or irritation at site where injected  signs of infection - fever or chills, cough, sore throat, pain or difficulty passing urine  signs of decreased platelets or bleeding - bruising, pinpoint red spots on the skin, black, tarry stools, blood in the urine  swelling of the ankles, feet, hands  tiredness  weakness Side effects that usually do not require medical attention (report to your doctor or health care professional if they continue or are bothersome):  diarrhea  hair loss  mouth sores  nail discoloration or damage  nausea  red colored urine  vomiting This list may not describe all possible side effects. Call your doctor for medical advice about side effects. You may report side effects to FDA at 1-800-FDA-1088. Where should I keep my medicine? This drug is given in a hospital or clinic and  will not be stored at home. NOTE: This sheet is a summary. It may not cover all possible information. If you have questions about this medicine, talk to your doctor, pharmacist, or health care provider.  2021 Elsevier/Gold Standard (2017-01-11 11:01:26)  Vinblastine injection What is this medicine? VINBLASTINE (vin BLAS teen) is a chemotherapy drug. It slows the growth of cancer cells. This medicine is used to treat many types of cancer like breast cancer, testicular cancer, Hodgkin's disease, non-Hodgkin's lymphoma, and sarcoma. This medicine may be used for other purposes; ask your health care provider or pharmacist if you have questions. COMMON BRAND NAME(S):  Velban What should I tell my health care provider before I take this medicine? They need to know if you have any of these conditions:  blood disorders  dental disease  gout  infection (especially a virus infection such as chickenpox, cold sores, or herpes)  liver disease  lung disease  nervous system disease  recent or ongoing radiation therapy  an unusual or allergic reaction to vinblastine, other chemotherapy agents, other medicines, foods, dyes, or preservatives  pregnant or trying to get pregnant  breast-feeding How should I use this medicine? This drug is given as an infusion into a vein. It is administered in a hospital or clinic by a specially trained health care professional. If you have pain, swelling, burning or any unusual feeling around the site of your injection, tell your health care professional right away. Talk to your pediatrician regarding the use of this medicine in children. While this drug may be prescribed for selected conditions, precautions do apply. Overdosage: If you think you have taken too much of this medicine contact a poison control center or emergency room at once. NOTE: This medicine is only for you. Do not share this medicine with others. What if I miss a dose? It is important not to miss your dose. Call your doctor or health care professional if you are unable to keep an appointment. What may interact with this medicine?  erythromycin  certain medicines for fungal infections like itraconazole, ketoconazole, posaconazole, voriconazole  certain medicines for seizures like phenytoin This list may not describe all possible interactions. Give your health care provider a list of all the medicines, herbs, non-prescription drugs, or dietary supplements you use. Also tell them if you smoke, drink alcohol, or use illegal drugs. Some items may interact with your medicine. What should I watch for while using this medicine? Your condition will be  monitored carefully while you are receiving this medicine. You will need important blood work done while you are taking this medicine. This drug may make you feel generally unwell. This is not uncommon, as chemotherapy can affect healthy cells as well as cancer cells. Report any side effects. Continue your course of treatment even though you feel ill unless your doctor tells you to stop. In some cases, you may be given additional medicines to help with side effects. Follow all directions for their use. Call your doctor or health care professional for advice if you get a fever, chills or sore throat, or other symptoms of a cold or flu. Do not treat yourself. This drug decreases your body's ability to fight infections. Try to avoid being around people who are sick. This medicine may increase your risk to bruise or bleed. Call your doctor or health care professional if you notice any unusual bleeding. Be careful brushing and flossing your teeth or using a toothpick because you may get an infection or bleed more  easily. If you have any dental work done, tell your dentist you are receiving this medicine. Avoid taking products that contain aspirin, acetaminophen, ibuprofen, naproxen, or ketoprofen unless instructed by your doctor. These medicines may hide a fever. Do not become pregnant while taking this medicine. Women should inform their doctor if they wish to become pregnant or think they might be pregnant. There is a potential for serious side effects to an unborn child. Talk to your health care professional or pharmacist for more information. Do not breast-feed an infant while taking this medicine. Men may have a lower sperm count while taking this medicine. Talk to your doctor if you plan to father a child. What side effects may I notice from receiving this medicine? Side effects that you should report to your doctor or health care professional as soon as possible:  allergic reactions like skin rash,  itching or hives, swelling of the face, lips, or tongue  low blood counts - This drug may decrease the number of white blood cells, red blood cells and platelets. You may be at increased risk for infections and bleeding.  signs of infection - fever or chills, cough, sore throat, pain or difficulty passing urine  signs of decreased platelets or bleeding - bruising, pinpoint red spots on the skin, black, tarry stools, nosebleeds  signs of decreased red blood cells - unusually weak or tired, fainting spells, lightheadedness  breathing problems  changes in hearing  change in the amount of urine  chest pain  high blood pressure  mouth sores  nausea and vomiting  pain, swelling, redness or irritation at the injection site  pain, tingling, numbness in the hands or feet  problems with balance, dizziness  seizures Side effects that usually do not require medical attention (report to your doctor or health care professional if they continue or are bothersome):  constipation  hair loss  jaw pain  loss of appetite  sensitivity to light  stomach pain  tumor pain This list may not describe all possible side effects. Call your doctor for medical advice about side effects. You may report side effects to FDA at 1-800-FDA-1088. Where should I keep my medicine? This drug is given in a hospital or clinic and will not be stored at home. NOTE: This sheet is a summary. It may not cover all possible information. If you have questions about this medicine, talk to your doctor, pharmacist, or health care provider.  2021 Elsevier/Gold Standard (2019-04-30 16:42:23)  Dacarbazine, DTIC injection What is this medicine? DACARBAZINE (da KAR ba zeen) is a chemotherapy drug. This medicine is used to treat skin cancer. It is also used with other medicines to treat Hodgkin's disease. This medicine may be used for other purposes; ask your health care provider or pharmacist if you have  questions. COMMON BRAND NAME(S): DTIC-Dome What should I tell my health care provider before I take this medicine? They need to know if you have any of these conditions:  infection (especially virus infection such as chickenpox, cold sores, or herpes)  kidney disease  liver disease  low blood counts like low platelets, red blood cells, white blood cells  recent radiation therapy  an unusual or allergic reaction to dacarbazine, other chemotherapy agents, other medicines, foods, dyes, or preservatives  pregnant or trying to get pregnant  breast-feeding How should I use this medicine? This drug is given as an injection or infusion into a vein. It is administered in a hospital or clinic by a specially trained health  care professional. Talk to your pediatrician regarding the use of this medicine in children. While this drug may be prescribed for selected conditions, precautions do apply. Overdosage: If you think you have taken too much of this medicine contact a poison control center or emergency room at once. NOTE: This medicine is only for you. Do not share this medicine with others. What if I miss a dose? It is important not to miss your dose. Call your doctor or health care professional if you are unable to keep an appointment. What may interact with this medicine?  medicines to increase blood counts like filgrastim, pegfilgrastim, sargramostim  vaccines This list may not describe all possible interactions. Give your health care provider a list of all the medicines, herbs, non-prescription drugs, or dietary supplements you use. Also tell them if you smoke, drink alcohol, or use illegal drugs. Some items may interact with your medicine. What should I watch for while using this medicine? Your condition will be monitored carefully while you are receiving this medicine. You will need important blood work done while you are taking this medicine. This drug may make you feel generally  unwell. This is not uncommon, as chemotherapy can affect healthy cells as well as cancer cells. Report any side effects. Continue your course of treatment even though you feel ill unless your doctor tells you to stop. Call your doctor or health care professional for advice if you get a fever, chills or sore throat, or other symptoms of a cold or flu. Do not treat yourself. This drug decreases your body's ability to fight infections. Try to avoid being around people who are sick. This medicine may increase your risk to bruise or bleed. Call your doctor or health care professional if you notice any unusual bleeding. Talk to your doctor about your risk of cancer. You may be more at risk for certain types of cancers if you take this medicine. Do not become pregnant while taking this medicine. Women should inform their doctor if they wish to become pregnant or think they might be pregnant. There is a potential for serious side effects to an unborn child. Talk to your health care professional or pharmacist for more information. Do not breast-feed an infant while taking this medicine. What side effects may I notice from receiving this medicine? Side effects that you should report to your doctor or health care professional as soon as possible:  allergic reactions like skin rash, itching or hives, swelling of the face, lips, or tongue  low blood counts - this medicine may decrease the number of white blood cells, red blood cells and platelets. You may be at increased risk for infections and bleeding.  signs of infection - fever or chills, cough, sore throat, pain or difficulty passing urine  signs of decreased platelets or bleeding - bruising, pinpoint red spots on the skin, black, tarry stools, blood in the urine  signs of decreased red blood cells - unusually weak or tired, fainting spells, lightheadedness  breathing problems  muscle pains  pain at site where injected  trouble passing urine or  change in the amount of urine  vomiting  yellowing of the eyes or skin Side effects that usually do not require medical attention (report to your doctor or health care professional if they continue or are bothersome):  diarrhea  hair loss  loss of appetite  nausea  skin more sensitive to sun or ultraviolet light  stomach upset This list may not describe all possible side  effects. Call your doctor for medical advice about side effects. You may report side effects to FDA at 1-800-FDA-1088. Where should I keep my medicine? This drug is given in a hospital or clinic and will not be stored at home. NOTE: This sheet is a summary. It may not cover all possible information. If you have questions about this medicine, talk to your doctor, pharmacist, or health care provider.  2021 Elsevier/Gold Standard (2015-07-31 15:17:39)  Brentuximab vedotin solution for injection What is this medicine? BRENTUXIMAB VEDOTIN (bren TUX see mab ve DOE tin) is a monoclonal antibody and a chemotherapy drug. It is used for treating Hodgkin lymphoma and certain non-Hodgkin lymphomas, such as anaplastic large-cell lymphoma, mycosis fungoides, and peripheral T-cell lymphoma. This medicine may be used for other purposes; ask your health care provider or pharmacist if you have questions. COMMON BRAND NAME(S): ADCETRIS What should I tell my health care provider before I take this medicine? They need to know if you have any of these conditions:  immune system problems  infection (especially a virus infection such as chickenpox, cold sores, or herpes)  kidney disease  liver disease  low blood counts, like low white cell, platelet, or red cell counts  tingling of the fingers or toes, or other nerve disorder  an unusual or allergic reaction to brentuximab vedotin, other medicines, foods, dyes, or preservatives  pregnant or trying to get pregnant  breast-feeding How should I use this medicine? This  medicine is for infusion into a vein. It is given by a health care professional in a hospital or clinic setting. Talk to your pediatrician regarding the use of this medicine in children. Special care may be needed. Overdosage: If you think you have taken too much of this medicine contact a poison control center or emergency room at once. NOTE: This medicine is only for you. Do not share this medicine with others. What if I miss a dose? It is important not to miss your dose. Call your doctor or health care professional if you are unable to keep an appointment. What may interact with this medicine? Do not take this medicine with any of the following medications:  bleomycin This medicine may also interact with the following medications:  ketoconazole  rifampin  St. John's wort; Hypericum perforatum This list may not describe all possible interactions. Give your health care provider a list of all the medicines, herbs, non-prescription drugs, or dietary supplements you use. Also tell them if you smoke, drink alcohol, or use illegal drugs. Some items may interact with your medicine. What should I watch for while using this medicine? Visit your doctor for checks on your progress. This drug may make you feel generally unwell. Report any side effects. Continue your course of treatment even though you feel ill unless your doctor tells you to stop. Call your doctor or health care professional for advice if you get a fever, chills or sore throat, or other symptoms of a cold or flu. Do not treat yourself. This drug decreases your body's ability to fight infections. Try to avoid being around people who are sick. This medicine may increase your risk to bruise or bleed. Call your doctor or health care professional if you notice any unusual bleeding. In some patients, this medicine may cause a serious brain infection that may cause death. If you have any problems seeing, thinking, speaking, walking, or  standing, tell your doctor right away. If you cannot reach your doctor, urgently seek other source of medical care.  Do not become pregnant while taking this medicine or for 6 months after stopping it. Women should inform their doctor if they wish to become pregnant or think they might be pregnant. Men should not father a child while taking this medicine and for 6 months after stopping it. There is a potential for serious side effects to an unborn child. Talk to your health care professional or pharmacist for more information. Do not breast-feed an infant while taking this medicine. This may interfere with the ability to father a child. You should talk to your doctor or health care professional if you are concerned about your fertility. What side effects may I notice from receiving this medicine? Side effects that you should report to your doctor or health care professional as soon as possible:  allergic reactions like skin rash, itching or hives, swelling of the face, lips, or tongue  changes in emotions or moods  diarrhea  low blood counts - this medicine may decrease the number of white blood cells, red blood cells and platelets. You may be at increased risk for infections and bleeding.  pain, tingling, numbness in the hands or feet  redness, blistering, peeling or loosening of the skin, including inside the mouth  shortness of breath  signs of infection - fever or chills, cough, sore throat, pain or difficulty passing urine  signs of decreased platelets or bleeding - bruising, pinpoint red spots on the skin, black, tarry stools, blood in the urine  signs of decreased red blood cells - unusually weak or tired, fainting spells, lightheadedness  signs of liver injury like dark yellow or brown urine; general ill feeling or flu-like symptoms; light-colored stools; loss of appetite; nausea; right upper belly pain; yellowing of the eyes or skin  stomach pain  sudden numbness or weakness of  the face, arm or leg  vomiting Side effects that usually do not require medical attention (report to your doctor or health care professional if they continue or are bothersome):  constipation  dizziness  headache  muscle pain  tiredness This list may not describe all possible side effects. Call your doctor for medical advice about side effects. You may report side effects to FDA at 1-800-FDA-1088. Where should I keep my medicine? This drug is given in a hospital or clinic and will not be stored at home. NOTE: This sheet is a summary. It may not cover all possible information. If you have questions about this medicine, talk to your doctor, pharmacist, or health care provider.  2021 Elsevier/Gold Standard (2019-04-29 20:04:26)

## 2020-09-29 NOTE — Progress Notes (Signed)
Blood return noted before, during, and after Doxorubicin IV injection.

## 2020-09-29 NOTE — Progress Notes (Signed)
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 [facility]) I have reviewed recent results of echocardiogram We will proceed with treatment as discussed I reviewed side effects with her and her son again  Peripheral neuropathy She has slight neuropathy affecting the bottom of her feet but it does not bother her She is on medications We discussed the risk of neuropathy with brentuximab She is willing to proceed with treatment   Lymphadenopathy of left cervical region She has palpable lymphadenopathy but it does not bother her I am hopeful it will resolve by her next visit   No orders of the defined types were placed in this encounter.   All questions were answered. The patient knows to call the clinic with any problems, questions or concerns. The total time spent in the appointment was 20 minutes encounter with patients including review of chart and various tests results, discussions about plan of care and coordination of care plan   Heath Lark, MD 09/29/2020 11:01 AM  INTERVAL HISTORY: Please see below for problem oriented charting. 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 Extensive hypermetabolic lymphadenopathy involving the neck, chest and abdomen as detailed above (Deauville 5). I do not see any pelvic disease. 2. Possible involvement of the spleen. 3. Osseous involvement is also noted.     09/03/2020 Pathology Results   A. LYMPH NODE, LEFT NECK, EXCISION:  -Lymphoproliferative disorder consistent with classical Hodgkin lymphoma -See comment   COMMENT:   The sections show effacement of the lymph nodal architecture by a primarily nodular lymphoproliferative process.  In the more cellular areas, the nodules are composed of a mixture of small lymphocytes, plasma cells, abundant histiocytes in addition to atypical large mononuclear and multi-lobated lymphoid appearing cells with variably prominent nucleoli.  The nodules are surrounded by dense collagenous fibrosis.  In the less cellular areas, there appears to be a fibroblastic proliferation and a much more depleted cellular appearance but scattering of large atypical lymphoid appearing cells is still seen.   Flow cytometric analysis was performed [facility] [id number]) and shows predominance of T lymphocytes with nonspecific changes including relative abundance of CD8 positive cells and reversal of the CD4: CD8 ratio.  No monoclonal B-cell population identified.  A battery of immunohistochemical stains was performed and shows that the large atypical lymphoid appearing cells are positive for CD30, Mum-1, PAX 5 (weak), CD23 in addition to variable but generally weak positivity for CD79a and CD20.  Only scattered large atypical lymphoid cells show weak Golgi positivity for CD15.  The large atypical lymphoid cells are negative for LCA, CD10, CD3, CD5, EMA, ALK protein and EBV in situ  hybridization.  The small lymphoid cells  in the background show a mixture of T and B cells with predominance of T cells.  T cells show a mixture of CD4  and CD8 positive cells with slight predominance of the latter.  The overall findings are most consistent with classical Hodgkin lymphoma, nodular sclerosis subtype.      09/21/2020 Procedure   Placement of a subcutaneous port device. Catheter tip at the superior cavoatrial junction.   09/25/2020 Echocardiogram   1. Left ventricular ejection fraction, by estimation, is 60 to 65%. The left ventricle has normal function. The left ventricle has no regional wall motion abnormalities. There is mild concentric left ventricular hypertrophy. Left ventricular diastolic parameters are consistent with Grade I diastolic dysfunction (impaired relaxation). The average left ventricular global longitudinal strain is -19.6 %. The global longitudinal strain is normal.  2. Right ventricular systolic function is normal. The right ventricular size is normal.  3. A small pericardial effusion is present. The pericardial effusion is surrounding the apex and anterior to the right ventricle. There is no evidence of cardiac tamponade.  4. The mitral valve is normal in structure. No evidence of mitral valve regurgitation.  5. The aortic valve is tricuspid. Aortic valve regurgitation is not visualized. Mild aortic valve sclerosis is present, with no evidence of aortic valve stenosis.  6. The inferior vena cava is normal in size with greater than 50% respiratory variability, suggesting right atrial pressure of 3 mmHg.   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[place])  09/14/2020 Initial Diagnosis   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[place])   09/29/2020 -  Chemotherapy    Patient is on Treatment Plan: HODGKINS LYMPHOMA  A + AVD Q28D        REVIEW OF SYSTEMS:   Constitutional: Denies fevers, chills or abnormal weight loss Eyes: Denies blurriness of vision Ears, nose, mouth, throat, and face: Denies mucositis or sore throat Respiratory: Denies cough, dyspnea or wheezes Cardiovascular: Denies palpitation, chest discomfort or lower  extremity swelling Gastrointestinal:  Denies nausea, heartburn or change in bowel habits Skin: Denies abnormal skin rashes Lymphatics: Denies new lymphadenopathy or easy bruising Neurological:Denies numbness, tingling or new weaknesses Behavioral/Psych: Mood is stable, no new changes  All other systems were reviewed with the patient and are negative.  I have reviewed the past medical history, past surgical history, social history and family history with the patient and they are unchanged from previous note.  ALLERGIES:  is allergic to codeine.  MEDICATIONS:  Current Outpatient Medications  Medication Sig Dispense Refill  . acyclovir (ZOVIRAX) 400 MG tablet Take 1 tablet (400 mg total) by mouth 2 (two) times daily. 60 tablet 6  . albuterol (VENTOLIN HFA) 108 (90 Base) MCG/ACT inhaler Inhale 2 puffs into the lungs every 6 (six) hours as needed for wheezing or shortness of breath. 8 g 0  . allopurinol (ZYLOPRIM) 300 MG tablet Take 1 tablet (300 mg total) by mouth daily. 30 tablet 1  . aspirin EC 81 MG tablet Take 81 mg by mouth daily.    Marland Kitchen atorvastatin (LIPITOR) 20 MG tablet TAKE 1 TABLET BY MOUTH ONCE DAILY 90 tablet 3  . BREO ELLIPTA 100-25 MCG/INH AEPB INHALE 1 PUFF INTO THE LUNGS DAILY 60 each 5  . cetirizine (ZYRTEC) 10 MG tablet TAKE 1 TABLET BY MOUTH ONCE DAILY 90 tablet 2  . denosumab (PROLIA) 60 MG/ML SOSY injection Inject 60 mg into the skin every 6 (six) months.    Marland Kitchen glucose blood (ACCU-CHEK AVIVA PLUS) test strip Use to Monitor blood sugars daily  100 each 5  . hydrochlorothiazide (MICROZIDE) 12.5 MG capsule Take 1 capsule (12.5 mg total) by mouth daily. 90 capsule 3  . Ibuprofen 200 MG CAPS Take 1 capsule by mouth daily.    Marland Kitchen lidocaine-prilocaine (EMLA) cream Apply to affected area once 30 g 3  . losartan (COZAAR) 100 MG tablet TAKE 1 TABLET BY MOUTH ONCE DAILY 90 tablet 2  . metoprolol succinate (TOPROL-XL) 50 MG 24 hr tablet TAKE 1 TABLET BY MOUTH ONCE A DAY WITH OR IMMEDIATELY  FOLLOWING A MEAL 90 tablet 2  . Multiple Vitamin (MULTIVITAMIN) capsule Take 1 capsule by mouth daily.    Marland Kitchen omeprazole (PRILOSEC) 20 MG capsule TAKE 1 CAPSULE BY MOUTH ONCE DAILY 90 capsule 3  . ondansetron (ZOFRAN) 8 MG tablet Take 1 tablet (8 mg total) by mouth every 8 (eight) hours as needed. 30 tablet 1  . prochlorperazine (COMPAZINE) 10 MG tablet Take 1 tablet (10 mg total) by mouth every 6 (six) hours as needed (Nausea or vomiting). 30 tablet 1   Current Facility-Administered Medications  Medication Dose Route Frequency Provider Last Rate Last Admin  . denosumab (PROLIA) injection 60 mg  60 mg Subcutaneous Q6 months Jeanice Lim, Kawanta F, MD   60 mg at 08/10/20 1000   Facility-Administered Medications Ordered in Other Visits  Medication Dose Route Frequency Provider Last Rate Last Admin  . brentuximab vedotin (ADCETRIS) 80 mg in sodium chloride 0.9 % 100 mL chemo infusion  1.2 mg/kg (Treatment Plan Recorded) Intravenous Once Bertis Ruddy, Jaelle Campanile, MD      . dacarbazine (DTIC) 640 mg in sodium chloride 0.9 % 250 mL chemo infusion  375 mg/m2 (Treatment Plan Recorded) Intravenous Once Bertis Ruddy, Braiden Rodman, MD      . DOXOrubicin (ADRIAMYCIN) chemo injection 42 mg  25 mg/m2 (Treatment Plan Recorded) Intravenous Once Bertis Ruddy, Marque Rademaker, MD      . heparin lock flush 100 unit/mL  500 Units Intracatheter Once PRN Bertis Ruddy, German Manke, MD      . sodium chloride flush (NS) 0.9 % injection 10 mL  10 mL Intracatheter PRN Tynetta Bachmann, MD      . vinBLAStine (VELBAN) 5.1 mg in sodium chloride 0.9 % 50 mL chemo infusion  3 mg/m2 (Treatment Plan Recorded) Intravenous Once Bertis Ruddy, Georgeanne Frankland, MD        PHYSICAL EXAMINATION: ECOG PERFORMANCE STATUS: 0 - Asymptomatic  Vitals:   09/29/20 0820  BP: (!) 158/56  Pulse: 100  Resp: 18  Temp: 98.8 F (37.1 C)  SpO2: 98%   Filed Weights   09/29/20 0820  Weight: 145 lb 6.4 oz (66 kg)    GENERAL:alert, no distress and comfortable SKIN: skin color, texture, turgor are normal, no rashes or  significant lesions EYES: normal, Conjunctiva are pink and non-injected, sclera clear OROPHARYNX:no exudate, no erythema and lips, buccal mucosa, and tongue normal  NECK: supple, thyroid normal size, non-tender, without nodularity LYMPH: She has persistent palpable lymphadenopathy, stable LUNGS: clear to auscultation and percussion with normal breathing effort HEART: regular rate & rhythm and no murmurs and no lower extremity edema ABDOMEN:abdomen soft, non-tender and normal bowel sounds Musculoskeletal:no cyanosis of digits and no clubbing.  Noted well-healed surgical scar from port placement NEURO: alert & oriented x 3 with fluent speech, no focal motor/sensory deficits  LABORATORY DATA:  I have reviewed the data as listed    Component Value Date/Time   NA 140 09/21/2020 0914   NA 143 08/15/2016 1037   K 3.8 09/21/2020 0914   K 4.8 08/15/2016 1037  CL 102 09/21/2020 0914   CL 103 07/16/2012 1304   CO2 26 09/21/2020 0914   CO2 27 08/15/2016 1037   GLUCOSE 120 (H) 09/21/2020 0914   GLUCOSE 111 08/15/2016 1037   GLUCOSE 112 (H) 07/16/2012 1304   BUN 10 09/21/2020 0914   BUN 12.7 08/15/2016 1037   CREATININE 0.80 09/21/2020 0914   CREATININE 0.95 (H) 08/10/2020 1041   CREATININE 0.9 08/15/2016 1037   CALCIUM 10.2 09/21/2020 0914   CALCIUM 10.3 08/13/2020 1117   CALCIUM 10.5 (H) 08/15/2016 1037   PROT 6.9 09/21/2020 0914   PROT 7.9 08/15/2016 1037   ALBUMIN 3.5 09/21/2020 0914   ALBUMIN 4.5 08/15/2016 1037   AST 19 09/21/2020 0914   AST 30 08/15/2016 1037   ALT 11 09/21/2020 0914   ALT 29 08/15/2016 1037   ALKPHOS 76 09/21/2020 0914   ALKPHOS 67 08/15/2016 1037   BILITOT 0.5 09/21/2020 0914   BILITOT 0.80 08/15/2016 1037   GFRNONAA >60 09/21/2020 0914   GFRNONAA 62 12/05/2017 1000   GFRAA 72 12/05/2017 1000    No results found for: SPEP, UPEP  Lab Results  Component Value Date   WBC 8.2 09/21/2020   NEUTROABS 6.2 09/21/2020   HGB 11.0 (L) 09/21/2020   HCT 33.5  (L) 09/21/2020   MCV 83.5 09/21/2020   PLT 316 09/21/2020      Chemistry      Component Value Date/Time   NA 140 09/21/2020 0914   NA 143 08/15/2016 1037   K 3.8 09/21/2020 0914   K 4.8 08/15/2016 1037   CL 102 09/21/2020 0914   CL 103 07/16/2012 1304   CO2 26 09/21/2020 0914   CO2 27 08/15/2016 1037   BUN 10 09/21/2020 0914   BUN 12.7 08/15/2016 1037   CREATININE 0.80 09/21/2020 0914   CREATININE 0.95 (H) 08/10/2020 1041   CREATININE 0.9 08/15/2016 1037      Component Value Date/Time   CALCIUM 10.2 09/21/2020 0914   CALCIUM 10.3 08/13/2020 1117   CALCIUM 10.5 (H) 08/15/2016 1037   ALKPHOS 76 09/21/2020 0914   ALKPHOS 67 08/15/2016 1037   AST 19 09/21/2020 0914   AST 30 08/15/2016 1037   ALT 11 09/21/2020 0914   ALT 29 08/15/2016 1037   BILITOT 0.5 09/21/2020 0914   BILITOT 0.80 08/15/2016 1037       RADIOGRAPHIC STUDIES: I have personally reviewed the radiological images as listed and agreed with the findings in the report. NM PET Image Initial (PI) Skull Base To Thigh  Result Date: 09/01/2020 CLINICAL DATA:  Initial treatment strategy for lymphadenopathy. EXAM: NUCLEAR MEDICINE PET SKULL BASE TO THIGH TECHNIQUE: 8.06 mCi F-18 FDG was injected intravenously. Full-ring PET imaging was performed from the skull base to thigh after the radiotracer. CT data was obtained and used for attenuation correction and anatomic localization. Fasting blood glucose: 141 mg/dl COMPARISON:  Neck and chest CT 08/13/2020 FINDINGS: Mediastinal blood pool activity: SUV max 1.59 Liver activity: SUV max 2.66 NECK: Bilateral hypermetabolic neck adenopathy as demonstrated on the CT scan. Enlarged lymph nodes an or adjacent to the posterior aspect of the left parotid gland are hypermetabolic with SUV max of 2.33. 9 mm right level 2 lymph node has an SUV max of 5.86. 17 mm left-sided node just deep to the sternocleidomastoid muscle on image 42/4 has an SUV max of 9.80. Multiple other hypermetabolic  neck nodes are noted bilaterally. Incidental CT findings: none CHEST: Extensive hypermetabolic adenopathy the involving the chest. There are  bilateral supraclavicular and axillary lymph nodes and extensive mediastinal and hilar adenopathy. Left axillary node has an SUV max of 14.80. Subcarinal nodal mass has an SUV max of 13.39 Right hilar/mediastinal nodal mass has an SUV max of 12.60. No worrisome pulmonary nodules. No breast masses are identified. Incidental CT findings: none ABDOMEN/PELVIS: Extensive hypermetabolic adenopathy the the abdomen. Periportal nodal mass has an SUV max of 9.79. Retroperitoneal nodal mass has an SUV max of 10.20 No pelvic or inguinal adenopathy. The spleen is normal in size. Small focus of hypermetabolism in the spleen could be a splenic lesion. SUV max is 3.92. Incidental CT findings: Vascular calcifications are noted. No aneurysm. There is a benign-appearing 4 cm cystic lesion in the left pelvis. No hypermetabolism. This is likely a benign peritoneal inclusion cyst. A postoperative seroma is also possible. SKELETON: There is a hypermetabolic lesion involving the right lower lobe which has an SUV max of 4.59. I do not see an actual pulmonary nodule or pleural lesion. It may be misregistration from a right rib lesion. There is also a E small hypermetabolic lesion in the left seventh rib posteriorly with SUV max of 3.72. There is also a lesion in the subtrochanteric right femur worrisome for lymphomatous involvement. The SUV max is 6.75 Incidental CT findings: none IMPRESSION: 1. Extensive hypermetabolic lymphadenopathy involving the neck, chest and abdomen as detailed above ([place]au[place] 5). I do not see any pelvic disease. 2. Possible involvement of the spleen. 3. Osseous involvement is also noted. Electronically Signed   By: Marijo Sanes M.D.   On: 09/01/2020 17:15   MM 3D SCREEN BREAST BILATERAL  Result Date: 09/16/2020 CLINICAL DATA:  Screening. Recently diagnosed with lymphoma.  EXAM: DIGITAL SCREENING BILATERAL MAMMOGRAM WITH TOMOSYNTHESIS AND CAD TECHNIQUE: Bilateral screening digital craniocaudal and mediolateral oblique mammograms were obtained. Bilateral screening digital breast tomosynthesis was performed. The images were evaluated with computer-aided detection. COMPARISON:  Previous exam(s). ACR Breast Density Category c: The breast tissue is heterogeneously dense, which may obscure small masses. FINDINGS: There are no findings suspicious for malignancy within either breast. There is an enlarged/morphologically abnormal lymph node in the lower LEFT axilla corresponding to patient's history of recently diagnosed lymphoma and corresponding to hypermetabolic adenopathy described on PET-CT report of 09/01/2020. The images were evaluated with computer-aided detection. IMPRESSION: No mammographic evidence of malignancy within either breast. A result letter of this screening mammogram will be mailed directly to the patient. RECOMMENDATION: Screening mammogram in one year. (Code:SM-B-01Y) BI-RADS CATEGORY  1: Negative. Electronically Signed   By: Franki Cabot M.D.   On: 09/16/2020 13:12   ECHOCARDIOGRAM COMPLETE  Result Date: 09/29/2020    ECHOCARDIOGRAM REPORT   Patient Name:   DALAYAH DEAHL [facility] Date of Exam: 09/25/2020 Medical Rec #:  [id number]          Height:       63.0 in Accession #:    [id number]         Weight:       146.8 lb Date of Birth:  1945/08/05           BSA:          1.695 m Patient Age:    45 years           BP:           128/72 mmHg Patient Gender: F                  HR:  82 bpm. Exam Location:  Outpatient Procedure: 2D Echo, 3D Echo, Cardiac Doppler, Color Doppler and Strain Analysis Indications:    Z51.11 Encounter for antineoplastic chemotheraphy  History:        Patient has prior history of Echocardiogram examinations, most                 recent 11/03/2014. Risk Factors:Hypertension and Dyslipidemia.                 GERD. Cancer.  Sonographer:    Tiffany  Dance Referring Phys: [id number] Rojean Ige [place]  1. Left ventricular ejection fraction, by estimation, is 60 to 65%. The left ventricle has normal function. The left ventricle has no regional wall motion abnormalities. There is mild concentric left ventricular hypertrophy. Left ventricular diastolic parameters are consistent with Grade I diastolic dysfunction (impaired relaxation). The average left ventricular global longitudinal strain is -19.6 %. The global longitudinal strain is normal.  2. Right ventricular systolic function is normal. The right ventricular size is normal.  3. A small pericardial effusion is present. The pericardial effusion is surrounding the apex and anterior to the right ventricle. There is no evidence of cardiac tamponade.  4. The mitral valve is normal in structure. No evidence of mitral valve regurgitation.  5. The aortic valve is tricuspid. Aortic valve regurgitation is not visualized. Mild aortic valve sclerosis is present, with no evidence of aortic valve stenosis.  6. The inferior vena cava is normal in size with greater than 50% respiratory variability, suggesting right atrial pressure of 3 mmHg. Comparison(s): Prior images unable to be directly viewed, comparison made by report only. FINDINGS  Left Ventricle: Left ventricular ejection fraction, by estimation, is 60 to 65%. The left ventricle has normal function. The left ventricle has no regional wall motion abnormalities. The average left ventricular global longitudinal strain is -19.6 %. The global longitudinal strain is normal. The left ventricular internal cavity size was normal in size. There is mild concentric left ventricular hypertrophy. Left ventricular diastolic parameters are consistent with Grade I diastolic dysfunction (impaired relaxation). Indeterminate filling pressures. Right Ventricle: The right ventricular size is normal. No increase in right ventricular wall thickness. Right ventricular systolic function is  normal. Left Atrium: Left atrial size was normal in size. Right Atrium: Right atrial size was normal in size. Pericardium: A small pericardial effusion is present. The pericardial effusion is surrounding the apex and anterior to the right ventricle. There is no evidence of cardiac tamponade. Mitral Valve: The mitral valve is normal in structure. Mild mitral annular calcification. No evidence of mitral valve regurgitation. Tricuspid Valve: The tricuspid valve is normal in structure. Tricuspid valve regurgitation is not demonstrated. Aortic Valve: The aortic valve is tricuspid. Aortic valve regurgitation is not visualized. Mild aortic valve sclerosis is present, with no evidence of aortic valve stenosis. Pulmonic Valve: The pulmonic valve was grossly normal. Pulmonic valve regurgitation is not visualized. Aorta: The aortic root and ascending aorta are structurally normal, with no evidence of dilitation. Venous: The inferior vena cava is normal in size with greater than 50% respiratory variability, suggesting right atrial pressure of 3 mmHg. IAS/Shunts: No atrial level shunt detected by color flow Doppler.  LEFT VENTRICLE PLAX 2D LVIDd:         3.50 cm  Diastology LVIDs:         2.50 cm  LV e' medial:    5.77 cm/s LV PW:         1.30 cm  LV E/e' medial:  10.4 LV IVS:        1.20 cm  LV e' lateral:   4.24 cm/s LVOT diam:     1.90 cm  LV E/e' lateral: 14.2 LV SV:         66 LV SV Index:   39       2D Longitudinal Strain LVOT Area:     2.84 cm 2D Strain GLS Avg:     -19.6 %                          3D Volume EF:                         3D EF:        63 %                         LV EDV:       98 ml                         LV ESV:       36 ml                         LV SV:        61 ml RIGHT VENTRICLE             IVC RV Basal diam:  2.80 cm     IVC diam: 1.40 cm RV S prime:     15.00 cm/s TAPSE (M-mode): 2.1 cm LEFT ATRIUM             Index       RIGHT ATRIUM          Index LA diam:        3.50 cm 2.06 cm/m  RA Area:      9.53 cm LA Vol (A2C):   42.0 ml 24.77 ml/m RA Volume:   19.20 ml 11.32 ml/m LA Vol (A4C):   39.4 ml 23.24 ml/m LA Biplane Vol: 41.9 ml 24.71 ml/m  AORTIC VALVE LVOT Vmax:   119.00 cm/s LVOT Vmean:  81.100 cm/s LVOT VTI:    0.233 m  AORTA Ao Root diam: 2.80 cm Ao Asc diam:  3.00 cm MITRAL VALVE MV Area (PHT): 3.31 cm    SHUNTS MV Decel Time: 229 msec    Systemic VTI:  0.23 m MV E velocity: 60.20 cm/s  Systemic Diam: 1.90 cm MV A velocity: 91.30 cm/s MV E/A ratio:  0.66 Mihai Croitoru MD Electronically signed by Sanda Klein MD Signature Date/Time: 09/29/2020/9:09:36 AM    Final    IR IMAGING GUIDED PORT INSERTION  Result Date: 09/21/2020 INDICATION: 75 year old with Hodgkin lymphoma. Port-A-Cath needed for treatment. EXAM: FLUOROSCOPIC AND ULTRASOUND GUIDED PLACEMENT OF A SUBCUTANEOUS PORT COMPARISON:  None. MEDICATIONS: Moderate sedation ANESTHESIA/SEDATION: Versed 2.0 mg IV; Fentanyl 100 mcg IV; Moderate Sedation Time:  36 minutes The patient was continuously monitored during the procedure by the interventional radiology nurse under my direct supervision. FLUOROSCOPY TIME:  36 seconds, 7 mGy COMPLICATIONS: None immediate. PROCEDURE: The procedure, risks, benefits, and alternatives were explained to the patient. Questions regarding the procedure were encouraged and answered. The patient understands and consents to the procedure. Patient was placed supine on the interventional table. Ultrasound confirmed a patent right internal jugular vein. Ultrasound image was saved for documentation. The right  chest and neck were cleaned with a skin antiseptic and a sterile drape was placed. Maximal barrier sterile technique was utilized including caps, mask, sterile gowns, sterile gloves, sterile drape, hand hygiene and skin antiseptic. The right neck was anesthetized with 1% lidocaine. Small incision was made in the right neck with a blade. Micropuncture set was placed in the right internal jugular vein with  ultrasound guidance. The micropuncture wire was used for measurement purposes. The right chest was anesthetized with 1% lidocaine with epinephrine. #15 blade was used to make an incision and a subcutaneous port pocket was formed. [place] was assembled. Subcutaneous tunnel was formed with a stiff tunneling device. The port catheter was brought through the subcutaneous tunnel. The port was placed in the subcutaneous pocket and sutured in place. The micropuncture set was exchanged for a peel-away sheath. The catheter was placed through the peel-away sheath and the tip was positioned at the superior cavoatrial junction. Catheter placement was confirmed with fluoroscopy. The port was accessed and flushed with heparinized saline. The port pocket was closed using two layers of absorbable sutures and Dermabond. The vein skin site was closed using a single layer of absorbable suture and Dermabond. Sterile dressings were applied. Patient tolerated the procedure well without an immediate complication. Ultrasound and fluoroscopic images were taken and saved for this procedure. IMPRESSION: Placement of a subcutaneous port device. Catheter tip at the superior cavoatrial junction. Electronically Signed   By: Markus Daft M.D.   On: 09/21/2020 13:03

## 2020-09-30 ENCOUNTER — Encounter: Payer: Self-pay | Admitting: Hematology and Oncology

## 2020-09-30 ENCOUNTER — Telehealth: Payer: Self-pay | Admitting: *Deleted

## 2020-09-30 NOTE — Telephone Encounter (Signed)
-----   Message from Lester [place], RN sent at 09/29/2020  2:15 PM EDT ----- Regarding: Dr. [name] Pt. received first time Doxorubicin, Vinblastine, Dacarbazine, and Brentuximab. Tolerated well, no issues noted. Had 1 episode of diarrhea at the end of treatment. Instructed her to take Imodium as needed and drink lots of fluids/water.

## 2020-09-30 NOTE — Progress Notes (Signed)
Called pt to introduce myself as her Arboriculturist.  Unfortunately there aren't any foundations offering copay assistance for her Dx and the type of ins she has.  I offered the [employer], went over what it covers and gave her the income requirement.  She stated she exceeds the income so she doesn't qualify for the grant at this time.

## 2020-09-30 NOTE — Telephone Encounter (Signed)
Called pt to see how she did with her treatment yest.  She reports doing well & no c/o's. She was very appreciative of care received.  She reports a "blow out" BM before she left but states wasn't diarrhea. She did not take imodium.  She held her miralax though & no further BM.  She reports knowing how to reach [place] with concerns & will be here tomorrow for injection & was reminded about claritin & tylenol.

## 2020-10-01 ENCOUNTER — Inpatient Hospital Stay: Payer: Medicare PPO

## 2020-10-01 ENCOUNTER — Other Ambulatory Visit: Payer: Self-pay

## 2020-10-01 DIAGNOSIS — Z5111 Encounter for antineoplastic chemotherapy: Secondary | ICD-10-CM | POA: Diagnosis not present

## 2020-10-01 DIAGNOSIS — C8118 Nodular sclerosis classical Hodgkin lymphoma, lymph nodes of multiple sites: Secondary | ICD-10-CM

## 2020-10-01 MED ORDER — PEGFILGRASTIM-JMDB 6 MG/0.6ML ~~LOC~~ SOSY
6.0000 mg | PREFILLED_SYRINGE | Freq: Once | SUBCUTANEOUS | Status: AC
  Administered 2020-10-01: 6 mg via SUBCUTANEOUS

## 2020-10-01 MED ORDER — PEGFILGRASTIM-JMDB 6 MG/0.6ML ~~LOC~~ SOSY
PREFILLED_SYRINGE | SUBCUTANEOUS | Status: AC
  Filled 2020-10-01: qty 0.6

## 2020-10-01 NOTE — Patient Instructions (Signed)
Pegfilgrastim injection What is this medicine? PEGFILGRASTIM (PEG fil gra stim) is a long-acting granulocyte colony-stimulating factor that stimulates the growth of neutrophils, a type of white blood cell important in the body's fight against infection. It is used to reduce the incidence of fever and infection in patients with certain types of cancer who are receiving chemotherapy that affects the bone marrow, and to increase survival after being exposed to high doses of radiation. This medicine may be used for other purposes; ask your health care provider or pharmacist if you have questions. COMMON BRAND NAME(S): Fulphila, Neulasta, Nyvepria, UDENYCA, Ziextenzo What should I tell my health care provider before I take this medicine? They need to know if you have any of these conditions:  kidney disease  latex allergy  ongoing radiation therapy  sickle cell disease  skin reactions to acrylic adhesives (On-Body Injector only)  an unusual or allergic reaction to pegfilgrastim, filgrastim, other medicines, foods, dyes, or preservatives  pregnant or trying to get pregnant  breast-feeding How should I use this medicine? This medicine is for injection under the skin. If you get this medicine at home, you will be taught how to prepare and give the pre-filled syringe or how to use the On-body Injector. Refer to the patient Instructions for Use for detailed instructions. Use exactly as directed. Tell your healthcare provider immediately if you suspect that the On-body Injector may not have performed as intended or if you suspect the use of the On-body Injector resulted in a missed or partial dose. It is important that you put your used needles and syringes in a special sharps container. Do not put them in a trash can. If you do not have a sharps container, call your pharmacist or healthcare provider to get one. Talk to your pediatrician regarding the use of this medicine in children. While this drug  may be prescribed for selected conditions, precautions do apply. Overdosage: If you think you have taken too much of this medicine contact a poison control center or emergency room at once. NOTE: This medicine is only for you. Do not share this medicine with others. What if I miss a dose? It is important not to miss your dose. Call your doctor or health care professional if you miss your dose. If you miss a dose due to an On-body Injector failure or leakage, a new dose should be administered as soon as possible using a single prefilled syringe for manual use. What may interact with this medicine? Interactions have not been studied. This list may not describe all possible interactions. Give your health care provider a list of all the medicines, herbs, non-prescription drugs, or dietary supplements you use. Also tell them if you smoke, drink alcohol, or use illegal drugs. Some items may interact with your medicine. What should I watch for while using this medicine? Your condition will be monitored carefully while you are receiving this medicine. You may need blood work done while you are taking this medicine. Talk to your health care provider about your risk of cancer. You may be more at risk for certain types of cancer if you take this medicine. If you are going to need a MRI, CT scan, or other procedure, tell your doctor that you are using this medicine (On-Body Injector only). What side effects may I notice from receiving this medicine? Side effects that you should report to your doctor or health care professional as soon as possible:  allergic reactions (skin rash, itching or hives, swelling of   the face, lips, or tongue)  back pain  dizziness  fever  pain, redness, or irritation at site where injected  pinpoint red spots on the skin  red or dark-brown urine  shortness of breath or breathing problems  stomach or side pain, or pain at the shoulder  swelling  tiredness  trouble  passing urine or change in the amount of urine  unusual bruising or bleeding Side effects that usually do not require medical attention (report to your doctor or health care professional if they continue or are bothersome):  bone pain  muscle pain This list may not describe all possible side effects. Call your doctor for medical advice about side effects. You may report side effects to FDA at 1-800-FDA-1088. Where should I keep my medicine? Keep out of the reach of children. If you are using this medicine at home, you will be instructed on how to store it. Throw away any unused medicine after the expiration date on the label. NOTE: This sheet is a summary. It may not cover all possible information. If you have questions about this medicine, talk to your doctor, pharmacist, or health care provider.  2021 Elsevier/Gold Standard (2019-06-21 13:20:51)  

## 2020-10-13 ENCOUNTER — Inpatient Hospital Stay: Payer: Medicare PPO | Attending: Hematology and Oncology

## 2020-10-13 ENCOUNTER — Inpatient Hospital Stay: Payer: Medicare PPO

## 2020-10-13 ENCOUNTER — Inpatient Hospital Stay: Payer: Medicare PPO | Admitting: Hematology and Oncology

## 2020-10-13 ENCOUNTER — Other Ambulatory Visit: Payer: Medicare PPO

## 2020-10-13 ENCOUNTER — Other Ambulatory Visit: Payer: Self-pay

## 2020-10-13 ENCOUNTER — Encounter: Payer: Self-pay | Admitting: Hematology and Oncology

## 2020-10-13 DIAGNOSIS — Z5111 Encounter for antineoplastic chemotherapy: Secondary | ICD-10-CM | POA: Insufficient documentation

## 2020-10-13 DIAGNOSIS — D72825 Bandemia: Secondary | ICD-10-CM | POA: Diagnosis not present

## 2020-10-13 DIAGNOSIS — G629 Polyneuropathy, unspecified: Secondary | ICD-10-CM | POA: Insufficient documentation

## 2020-10-13 DIAGNOSIS — D6481 Anemia due to antineoplastic chemotherapy: Secondary | ICD-10-CM | POA: Diagnosis not present

## 2020-10-13 DIAGNOSIS — T451X5A Adverse effect of antineoplastic and immunosuppressive drugs, initial encounter: Secondary | ICD-10-CM | POA: Insufficient documentation

## 2020-10-13 DIAGNOSIS — G609 Hereditary and idiopathic neuropathy, unspecified: Secondary | ICD-10-CM | POA: Diagnosis not present

## 2020-10-13 DIAGNOSIS — D72829 Elevated white blood cell count, unspecified: Secondary | ICD-10-CM | POA: Insufficient documentation

## 2020-10-13 DIAGNOSIS — Z79899 Other long term (current) drug therapy: Secondary | ICD-10-CM | POA: Insufficient documentation

## 2020-10-13 DIAGNOSIS — C8118 Nodular sclerosis classical Hodgkin lymphoma, lymph nodes of multiple sites: Secondary | ICD-10-CM

## 2020-10-13 DIAGNOSIS — C8198 Hodgkin lymphoma, unspecified, lymph nodes of multiple sites: Secondary | ICD-10-CM | POA: Diagnosis not present

## 2020-10-13 DIAGNOSIS — Z7951 Long term (current) use of inhaled steroids: Secondary | ICD-10-CM | POA: Insufficient documentation

## 2020-10-13 DIAGNOSIS — K219 Gastro-esophageal reflux disease without esophagitis: Secondary | ICD-10-CM | POA: Insufficient documentation

## 2020-10-13 DIAGNOSIS — Z7982 Long term (current) use of aspirin: Secondary | ICD-10-CM | POA: Insufficient documentation

## 2020-10-13 LAB — URIC ACID: Uric Acid, Serum: 3.8 mg/dL (ref 2.5–7.1)

## 2020-10-13 LAB — CBC WITH DIFFERENTIAL (CANCER CENTER ONLY)
Abs Immature Granulocytes: 2.23 10*3/uL — ABNORMAL HIGH (ref 0.00–0.07)
Basophils Absolute: 0.2 10*3/uL — ABNORMAL HIGH (ref 0.0–0.1)
Basophils Relative: 1 %
Eosinophils Absolute: 0.3 10*3/uL (ref 0.0–0.5)
Eosinophils Relative: 1 %
HCT: 35.8 % — ABNORMAL LOW (ref 36.0–46.0)
Hemoglobin: 11.5 g/dL — ABNORMAL LOW (ref 12.0–15.0)
Immature Granulocytes: 9 %
Lymphocytes Relative: 11 %
Lymphs Abs: 2.6 10*3/uL (ref 0.7–4.0)
MCH: 27.5 pg (ref 26.0–34.0)
MCHC: 32.1 g/dL (ref 30.0–36.0)
MCV: 85.6 fL (ref 80.0–100.0)
Monocytes Absolute: 1.5 10*3/uL — ABNORMAL HIGH (ref 0.1–1.0)
Monocytes Relative: 6 %
Neutro Abs: 17.3 10*3/uL — ABNORMAL HIGH (ref 1.7–7.7)
Neutrophils Relative %: 72 %
Platelet Count: 234 10*3/uL (ref 150–400)
RBC: 4.18 MIL/uL (ref 3.87–5.11)
RDW: 15.3 % (ref 11.5–15.5)
WBC Count: 24.1 10*3/uL — ABNORMAL HIGH (ref 4.0–10.5)
nRBC: 0.1 % (ref 0.0–0.2)

## 2020-10-13 LAB — CMP (CANCER CENTER ONLY)
ALT: 16 U/L (ref 0–44)
AST: 26 U/L (ref 15–41)
Albumin: 3.9 g/dL (ref 3.5–5.0)
Alkaline Phosphatase: 87 U/L (ref 38–126)
Anion gap: 11 (ref 5–15)
BUN: 10 mg/dL (ref 8–23)
CO2: 27 mmol/L (ref 22–32)
Calcium: 9.4 mg/dL (ref 8.9–10.3)
Chloride: 101 mmol/L (ref 98–111)
Creatinine: 0.79 mg/dL (ref 0.44–1.00)
GFR, Estimated: >60 mL/min (ref >60)
Glucose, Bld: 116 mg/dL — ABNORMAL HIGH (ref 70–99)
Potassium: 3.5 mmol/L (ref 3.5–5.1)
Sodium: 139 mmol/L (ref 135–145)
Total Bilirubin: 0.3 mg/dL (ref 0.3–1.2)
Total Protein: 6.7 g/dL (ref 6.5–8.1)

## 2020-10-13 LAB — SEDIMENTATION RATE: Sed Rate: 17 mm/hr (ref 0–22)

## 2020-10-13 LAB — LACTATE DEHYDROGENASE: LDH: 293 U/L — ABNORMAL HIGH (ref 98–192)

## 2020-10-13 MED ORDER — PALONOSETRON HCL INJECTION 0.25 MG/5ML
INTRAVENOUS | Status: AC
  Filled 2020-10-13: qty 5

## 2020-10-13 MED ORDER — SODIUM CHLORIDE 0.9 % IV SOLN
375.0000 mg/m2 | Freq: Once | INTRAVENOUS | Status: AC
  Administered 2020-10-13: 640 mg via INTRAVENOUS
  Filled 2020-10-13: qty 64

## 2020-10-13 MED ORDER — ACETAMINOPHEN 325 MG PO TABS
650.0000 mg | ORAL_TABLET | Freq: Once | ORAL | Status: AC
  Administered 2020-10-13: 650 mg via ORAL

## 2020-10-13 MED ORDER — ACETAMINOPHEN 325 MG PO TABS
ORAL_TABLET | ORAL | Status: AC
  Filled 2020-10-13: qty 2

## 2020-10-13 MED ORDER — SODIUM CHLORIDE 0.9% FLUSH
10.0000 mL | INTRAVENOUS | Status: DC | PRN
  Administered 2020-10-13: 10 mL
  Filled 2020-10-13: qty 10

## 2020-10-13 MED ORDER — FOSAPREPITANT DIMEGLUMINE INJECTION 150 MG
150.0000 mg | Freq: Once | INTRAVENOUS | Status: AC
  Administered 2020-10-13: 150 mg via INTRAVENOUS
  Filled 2020-10-13: qty 150

## 2020-10-13 MED ORDER — SODIUM CHLORIDE 0.9 % IV SOLN
1.2000 mg/kg | Freq: Once | INTRAVENOUS | Status: AC
  Administered 2020-10-13: 80 mg via INTRAVENOUS
  Filled 2020-10-13: qty 16

## 2020-10-13 MED ORDER — SODIUM CHLORIDE 0.9 % IV SOLN
10.0000 mg | Freq: Once | INTRAVENOUS | Status: AC
  Administered 2020-10-13: 10 mg via INTRAVENOUS
  Filled 2020-10-13: qty 10

## 2020-10-13 MED ORDER — HEPARIN SOD (PORK) LOCK FLUSH 100 UNIT/ML IV SOLN
500.0000 [IU] | Freq: Once | INTRAVENOUS | Status: AC | PRN
  Administered 2020-10-13: 500 [IU]
  Filled 2020-10-13: qty 5

## 2020-10-13 MED ORDER — SODIUM CHLORIDE 0.9% FLUSH
10.0000 mL | Freq: Once | INTRAVENOUS | Status: AC
  Administered 2020-10-13: 10 mL
  Filled 2020-10-13: qty 10

## 2020-10-13 MED ORDER — DIPHENHYDRAMINE HCL 50 MG/ML IJ SOLN
INTRAMUSCULAR | Status: AC
  Filled 2020-10-13: qty 1

## 2020-10-13 MED ORDER — PALONOSETRON HCL INJECTION 0.25 MG/5ML
0.2500 mg | Freq: Once | INTRAVENOUS | Status: AC
  Administered 2020-10-13: 0.25 mg via INTRAVENOUS

## 2020-10-13 MED ORDER — DIPHENHYDRAMINE HCL 50 MG/ML IJ SOLN
12.5000 mg | Freq: Once | INTRAMUSCULAR | Status: AC
  Administered 2020-10-13: 12.5 mg via INTRAVENOUS

## 2020-10-13 MED ORDER — SODIUM CHLORIDE 0.9 % IV SOLN
3.0000 mg/m2 | Freq: Once | INTRAVENOUS | Status: AC
  Administered 2020-10-13: 5.1 mg via INTRAVENOUS
  Filled 2020-10-13: qty 5.1

## 2020-10-13 MED ORDER — SODIUM CHLORIDE 0.9 % IV SOLN
Freq: Once | INTRAVENOUS | Status: AC
  Filled 2020-10-13: qty 250

## 2020-10-13 MED ORDER — DOXORUBICIN HCL CHEMO IV INJECTION 2 MG/ML
25.0000 mg/m2 | Freq: Once | INTRAVENOUS | Status: AC
  Administered 2020-10-13: 42 mg via INTRAVENOUS
  Filled 2020-10-13: qty 21

## 2020-10-13 NOTE — Assessment & Plan Note (Signed)
Neuropathy is stable Observe for now 

## 2020-10-13 NOTE — Assessment & Plan Note (Signed)
She tolerated chemotherapy very well with no side effects except for very mild constipation Her night sweats and fever had resolved She has no palpable lymphadenopathy on exam today We will proceed with treatment without delay She needs minimum 2 cycles of treatment, 4 doses of chemotherapy before repeat imaging study

## 2020-10-13 NOTE — Progress Notes (Signed)
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 The patient denies recent history of bleeding such as epistaxis, hematuria or hematochezia. She is asymptomatic from the anemia. I will observe for now.  She does not require transfusion now. I will continue the chemotherapy at current dose without dosage adjustment.  If the anemia gets progressive worse in the future, I might have to delay her treatment or adjust the chemotherapy dose.   Leukocytosis This is due to recent G-CSF support Observe for now   No orders of the defined types were placed in this encounter.   All questions were answered. The patient knows to call the clinic with any problems, questions or concerns. The total time spent in the appointment was 20 minutes encounter with patients including review of chart and various tests results, discussions about plan of care and coordination of care plan   Heath Lark, MD 10/13/2020 10:37 AM  INTERVAL HISTORY: Please see below for problem oriented charting. 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 Extensive hypermetabolic lymphadenopathy involving the neck, chest and abdomen as detailed above ([place] 5). I do not see any pelvic disease. 2. Possible involvement of the spleen. 3. Osseous involvement is also noted.     09/03/2020 Pathology Results   A. LYMPH NODE, LEFT NECK, EXCISION:  -Lymphoproliferative disorder consistent with classical Hodgkin lymphoma -See comment   COMMENT:   The sections show effacement of the lymph nodal architecture by a primarily nodular lymphoproliferative process.  In the more cellular areas, the nodules are composed of a mixture of small lymphocytes, plasma cells, abundant histiocytes in addition to atypical large mononuclear and multi-lobated lymphoid appearing cells with variably prominent nucleoli.  The nodules are surrounded by dense collagenous fibrosis.  In the less cellular areas, there appears to be a fibroblastic proliferation and a much more depleted cellular appearance but scattering of large atypical lymphoid appearing cells is still seen.   Flow cytometric analysis was performed [facility] [phone]) and shows predominance of T lymphocytes with nonspecific changes including relative abundance of CD8 positive cells and reversal of the CD4: CD8 ratio.  No monoclonal B-cell population identified.  A battery of immunohistochemical stains was performed  and shows that the large atypical lymphoid appearing cells are positive for CD30, Mum-1, PAX 5 (weak), CD23 in addition to variable but generally weak positivity for CD79a and CD20.  Only scattered large atypical lymphoid cells show weak Golgi positivity for CD15.  The large atypical lymphoid cells are negative for LCA, CD10, CD3, CD5, EMA, ALK protein and EBV in situ  hybridization.  The small lymphoid cells in the background show a mixture of T and B cells with predominance of T cells.  T cells show a mixture of CD4 and CD8 positive cells with slight predominance of the latter.  The overall findings are most consistent with classical Hodgkin lymphoma, nodular sclerosis subtype.      09/21/2020 Procedure   Placement of a subcutaneous port device. Catheter tip at the superior cavoatrial junction.   09/25/2020 Echocardiogram   1. Left ventricular ejection fraction, by estimation, is 60 to 65%. The left ventricle has normal function. The left ventricle has no regional wall motion abnormalities. There is mild concentric left ventricular hypertrophy. Left ventricular diastolic parameters are consistent with Grade I diastolic dysfunction (impaired relaxation). The average left ventricular global longitudinal strain is -19.6 %. The global longitudinal strain is normal.  2. Right ventricular systolic function is normal. The right ventricular size is normal.  3. A small pericardial effusion is present. The pericardial effusion is surrounding the apex and anterior to the right ventricle. There is no evidence of cardiac tamponade.  4. The mitral valve is normal in structure. No evidence of mitral valve regurgitation.  5. The aortic valve is tricuspid. Aortic valve regurgitation is not visualized. Mild aortic valve sclerosis is present, with no evidence of aortic valve stenosis.  6. The inferior vena cava is normal in size with greater than 50% respiratory variability, suggesting right atrial pressure of 3 mmHg.    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[place])  09/14/2020 Initial Diagnosis   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[place])   09/29/2020 -  Chemotherapy    Patient is on Treatment Plan: HODGKINS LYMPHOMA  A + AVD Q28D        REVIEW OF SYSTEMS:   Constitutional: Denies fevers, chills or abnormal weight loss Eyes: Denies blurriness of vision Ears, nose, mouth, throat, and face: Denies mucositis or sore throat Respiratory: Denies cough, dyspnea or wheezes Cardiovascular: Denies palpitation, chest discomfort or lower extremity swelling Skin: Denies abnormal skin rashes Lymphatics: Denies new lymphadenopathy or easy bruising Neurological:Denies numbness, tingling or new weaknesses Behavioral/Psych: Mood is stable, no new changes  All other systems were reviewed with the patient and are negative.  I have reviewed the past medical history, past surgical history, social history and family history with the patient and they are unchanged from previous note.  ALLERGIES:  is allergic to codeine.  MEDICATIONS:  Current Outpatient Medications  Medication Sig Dispense Refill  . acyclovir (ZOVIRAX) 400 MG tablet Take 1 tablet (400 mg total) by mouth 2 (two) times daily. 60 tablet 6  . albuterol (VENTOLIN HFA) 108 (90 Base) MCG/ACT inhaler Inhale 2 puffs into the lungs every 6 (six) hours as needed for wheezing or shortness of breath. 8 g 0  . allopurinol (ZYLOPRIM) 300 MG tablet Take 1 tablet (300 mg total) by mouth daily. 30 tablet 1  . aspirin EC 81 MG tablet Take 81 mg by mouth daily.    Marland Kitchen atorvastatin (LIPITOR) 20 MG tablet TAKE 1 TABLET BY MOUTH ONCE DAILY 90 tablet 3  .  BREO ELLIPTA 100-25 MCG/INH AEPB INHALE 1 PUFF INTO THE LUNGS DAILY 60 each 5  . cetirizine (ZYRTEC) 10 MG tablet TAKE 1 TABLET BY MOUTH ONCE DAILY 90 tablet 2  . denosumab (PROLIA) 60 MG/ML SOSY injection Inject 60 mg into the skin every 6 (six) months.    Marland Kitchen glucose blood (ACCU-CHEK AVIVA PLUS) test strip Use to Monitor blood  sugars daily 100 each 5  . hydrochlorothiazide (MICROZIDE) 12.5 MG capsule Take 1 capsule (12.5 mg total) by mouth daily. 90 capsule 3  . Ibuprofen 200 MG CAPS Take 1 capsule by mouth daily.    Marland Kitchen lidocaine-prilocaine (EMLA) cream Apply to affected area once 30 g 3  . losartan (COZAAR) 100 MG tablet TAKE 1 TABLET BY MOUTH ONCE DAILY 90 tablet 2  . metoprolol succinate (TOPROL-XL) 50 MG 24 hr tablet TAKE 1 TABLET BY MOUTH ONCE A DAY WITH OR IMMEDIATELY FOLLOWING A MEAL 90 tablet 2  . Multiple Vitamin (MULTIVITAMIN) capsule Take 1 capsule by mouth daily.    Marland Kitchen omeprazole (PRILOSEC) 20 MG capsule TAKE 1 CAPSULE BY MOUTH ONCE DAILY 90 capsule 3  . ondansetron (ZOFRAN) 8 MG tablet Take 1 tablet (8 mg total) by mouth every 8 (eight) hours as needed. 30 tablet 1  . prochlorperazine (COMPAZINE) 10 MG tablet Take 1 tablet (10 mg total) by mouth every 6 (six) hours as needed (Nausea or vomiting). 30 tablet 1   Current Facility-Administered Medications  Medication Dose Route Frequency Provider Last Rate Last Admin  . denosumab (PROLIA) injection 60 mg  60 mg Subcutaneous Q6 months Buelah Manis, Kawanta F, MD   60 mg at 08/10/20 1000   Facility-Administered Medications Ordered in Other Visits  Medication Dose Route Frequency Provider Last Rate Last Admin  . acetaminophen (TYLENOL) tablet 650 mg  650 mg Oral Once [name], Shamonique Battiste, MD      . brentuximab vedotin (ADCETRIS) 80 mg in sodium chloride 0.9 % 100 mL chemo infusion  1.2 mg/kg (Treatment Plan Recorded) Intravenous Once [name], Kamdin Follett, MD      . dacarbazine (DTIC) 640 mg in sodium chloride 0.9 % 250 mL chemo infusion  375 mg/m2 (Treatment Plan Recorded) Intravenous Once [name], Yoshiaki Kreuser, MD      . dexamethasone (DECADRON) 10 mg in sodium chloride 0.9 % 50 mL IVPB  10 mg Intravenous Once [name], Makhya Arave, MD      . diphenhydrAMINE (BENADRYL) injection 12.5 mg  12.5 mg Intravenous Once [name], Ronnett Pullin, MD      . DOXOrubicin (ADRIAMYCIN) chemo injection 42 mg  25 mg/m2 (Treatment  Plan Recorded) Intravenous Once [name], Saladin Petrelli, MD      . fosaprepitant (EMEND) 150 mg in sodium chloride 0.9 % 145 mL IVPB  150 mg Intravenous Once [name], Laelle Bridgett, MD      . heparin lock flush 100 unit/mL  500 Units Intracatheter Once PRN [name], Griffey Nicasio, MD      . palonosetron (ALOXI) injection 0.25 mg  0.25 mg Intravenous Once Bianey Tesoro, MD      . sodium chloride flush (NS) 0.9 % injection 10 mL  10 mL Intracatheter PRN Meaghen Vecchiarelli, MD      . vinBLAStine (VELBAN) 5.1 mg in sodium chloride 0.9 % 50 mL chemo infusion  3 mg/m2 (Treatment Plan Recorded) Intravenous Once Heath Lark, MD        PHYSICAL EXAMINATION: ECOG PERFORMANCE STATUS: 0 - Asymptomatic  Vitals:   10/13/20 0926  BP: (!) 136/55  Pulse: 91  Resp: 18  Temp: 97.9 F (36.6 C)  SpO2: 100%   Filed Weights   10/13/20 0926  Weight: 144 lb (65.3 kg)    GENERAL:alert, no distress and comfortable SKIN: skin color, texture, turgor are normal, no rashes or significant lesions EYES: normal, Conjunctiva are pink and non-injected, sclera clear OROPHARYNX:no exudate, no erythema and lips, buccal mucosa, and tongue normal  NECK: supple, thyroid normal size, non-tender, without nodularity LYMPH:  no palpable lymphadenopathy in the cervical, axillary or inguinal LUNGS: clear to auscultation and percussion with normal breathing effort HEART: regular rate & rhythm and no murmurs and no lower extremity edema ABDOMEN:abdomen soft, non-tender and normal bowel sounds Musculoskeletal:no cyanosis of digits and no clubbing  NEURO: alert & oriented x 3 with fluent speech, no focal motor/sensory deficits  LABORATORY DATA:  I have reviewed the data as listed    Component Value Date/Time   NA 139 10/13/2020 0909   NA 143 08/15/2016 1037   K 3.5 10/13/2020 0909   K 4.8 08/15/2016 1037   CL 101 10/13/2020 0909   CL 103 07/16/2012 1304   CO2 27 10/13/2020 0909   CO2 27 08/15/2016 1037   GLUCOSE 116 (H) 10/13/2020 0909   GLUCOSE 111 08/15/2016  1037   GLUCOSE 112 (H) 07/16/2012 1304   BUN 10 10/13/2020 0909   BUN 12.7 08/15/2016 1037   CREATININE 0.79 10/13/2020 0909   CREATININE 0.95 (H) 08/10/2020 1041   CREATININE 0.9 08/15/2016 1037   CALCIUM 9.4 10/13/2020 0909   CALCIUM 10.3 08/13/2020 1117   CALCIUM 10.5 (H) 08/15/2016 1037   PROT 6.7 10/13/2020 0909   PROT 7.9 08/15/2016 1037   ALBUMIN 3.9 10/13/2020 0909   ALBUMIN 4.5 08/15/2016 1037   AST 26 10/13/2020 0909   AST 30 08/15/2016 1037   ALT 16 10/13/2020 0909   ALT 29 08/15/2016 1037   ALKPHOS 87 10/13/2020 0909   ALKPHOS 67 08/15/2016 1037   BILITOT 0.3 10/13/2020 0909   BILITOT 0.80 08/15/2016 1037   GFRNONAA >60 10/13/2020 0909   GFRNONAA 62 12/05/2017 1000   GFRAA 72 12/05/2017 1000    No results found for: SPEP, UPEP  Lab Results  Component Value Date   WBC 24.1 (H) 10/13/2020   NEUTROABS 17.3 (H) 10/13/2020   HGB 11.5 (L) 10/13/2020   HCT 35.8 (L) 10/13/2020   MCV 85.6 10/13/2020   PLT 234 10/13/2020      Chemistry      Component Value Date/Time   NA 139 10/13/2020 0909   NA 143 08/15/2016 1037   K 3.5 10/13/2020 0909   K 4.8 08/15/2016 1037   CL 101 10/13/2020 0909   CL 103 07/16/2012 1304   CO2 27 10/13/2020 0909   CO2 27 08/15/2016 1037   BUN 10 10/13/2020 0909   BUN 12.7 08/15/2016 1037   CREATININE 0.79 10/13/2020 0909   CREATININE 0.95 (H) 08/10/2020 1041   CREATININE 0.9 08/15/2016 1037      Component Value Date/Time   CALCIUM 9.4 10/13/2020 0909   CALCIUM 10.3 08/13/2020 1117   CALCIUM 10.5 (H) 08/15/2016 1037   ALKPHOS 87 10/13/2020 0909   ALKPHOS 67 08/15/2016 1037   AST 26 10/13/2020 0909   AST 30 08/15/2016 1037   ALT 16 10/13/2020 0909   ALT 29 08/15/2016 1037   BILITOT 0.3 10/13/2020 0909   BILITOT 0.80 08/15/2016 1037

## 2020-10-13 NOTE — Patient Instructions (Signed)

## 2020-10-13 NOTE — Assessment & Plan Note (Signed)
This is due to recent G-CSF support Observe for now 

## 2020-10-13 NOTE — Patient Instructions (Signed)
[facility] Discharge Instructions for Patients Receiving Chemotherapy  Today you received the following chemotherapy agents: Doxorubicin, Vinblastine, Dacarbazine, and Brentuximab Vedotin  To help prevent nausea and vomiting after your treatment, we encourage you to take your nausea medication as directed by your MD.   If you develop nausea and vomiting that is not controlled by your nausea medication, call the clinic.   BELOW ARE SYMPTOMS THAT SHOULD BE REPORTED IMMEDIATELY:  *FEVER GREATER THAN 100.5 F  *CHILLS WITH OR WITHOUT FEVER  NAUSEA AND VOMITING THAT IS NOT CONTROLLED WITH YOUR NAUSEA MEDICATION  *UNUSUAL SHORTNESS OF BREATH  *UNUSUAL BRUISING OR BLEEDING  TENDERNESS IN MOUTH AND THROAT WITH OR WITHOUT PRESENCE OF ULCERS  *URINARY PROBLEMS  *BOWEL PROBLEMS  UNUSUAL RASH Items with * indicate a potential emergency and should be followed up as soon as possible.  Feel free to call the clinic should you have any questions or concerns. The clinic phone number is [phone].  Please show the [place] at check-in to the Emergency Department and triage nurse.

## 2020-10-13 NOTE — Assessment & Plan Note (Signed)

## 2020-10-15 ENCOUNTER — Other Ambulatory Visit: Payer: Self-pay

## 2020-10-15 ENCOUNTER — Inpatient Hospital Stay: Payer: Medicare PPO

## 2020-10-15 VITALS — BP 132/63 | HR 78 | Temp 97.7°F | Resp 18

## 2020-10-15 DIAGNOSIS — Z5111 Encounter for antineoplastic chemotherapy: Secondary | ICD-10-CM | POA: Diagnosis not present

## 2020-10-15 DIAGNOSIS — C8118 Nodular sclerosis classical Hodgkin lymphoma, lymph nodes of multiple sites: Secondary | ICD-10-CM

## 2020-10-15 MED ORDER — PEGFILGRASTIM-JMDB 6 MG/0.6ML ~~LOC~~ SOSY
6.0000 mg | PREFILLED_SYRINGE | Freq: Once | SUBCUTANEOUS | Status: AC
  Administered 2020-10-15: 6 mg via SUBCUTANEOUS

## 2020-10-15 MED ORDER — PEGFILGRASTIM-JMDB 6 MG/0.6ML ~~LOC~~ SOSY
PREFILLED_SYRINGE | SUBCUTANEOUS | Status: AC
  Filled 2020-10-15: qty 0.6

## 2020-10-15 NOTE — Patient Instructions (Signed)
Pegfilgrastim injection What is this medicine? PEGFILGRASTIM (PEG fil gra stim) is a long-acting granulocyte colony-stimulating factor that stimulates the growth of neutrophils, a type of white blood cell important in the body's fight against infection. It is used to reduce the incidence of fever and infection in patients with certain types of cancer who are receiving chemotherapy that affects the bone marrow, and to increase survival after being exposed to high doses of radiation. This medicine may be used for other purposes; ask your health care provider or pharmacist if you have questions. COMMON BRAND NAME(S): Fulphila, Neulasta, Nyvepria, UDENYCA, Ziextenzo What should I tell my health care provider before I take this medicine? They need to know if you have any of these conditions:  kidney disease  latex allergy  ongoing radiation therapy  sickle cell disease  skin reactions to acrylic adhesives (On-Body Injector only)  an unusual or allergic reaction to pegfilgrastim, filgrastim, other medicines, foods, dyes, or preservatives  pregnant or trying to get pregnant  breast-feeding How should I use this medicine? This medicine is for injection under the skin. If you get this medicine at home, you will be taught how to prepare and give the pre-filled syringe or how to use the On-body Injector. Refer to the patient Instructions for Use for detailed instructions. Use exactly as directed. Tell your healthcare provider immediately if you suspect that the On-body Injector may not have performed as intended or if you suspect the use of the On-body Injector resulted in a missed or partial dose. It is important that you put your used needles and syringes in a special sharps container. Do not put them in a trash can. If you do not have a sharps container, call your pharmacist or healthcare provider to get one. Talk to your pediatrician regarding the use of this medicine in children. While this drug  may be prescribed for selected conditions, precautions do apply. Overdosage: If you think you have taken too much of this medicine contact a poison control center or emergency room at once. NOTE: This medicine is only for you. Do not share this medicine with others. What if I miss a dose? It is important not to miss your dose. Call your doctor or health care professional if you miss your dose. If you miss a dose due to an On-body Injector failure or leakage, a new dose should be administered as soon as possible using a single prefilled syringe for manual use. What may interact with this medicine? Interactions have not been studied. This list may not describe all possible interactions. Give your health care provider a list of all the medicines, herbs, non-prescription drugs, or dietary supplements you use. Also tell them if you smoke, drink alcohol, or use illegal drugs. Some items may interact with your medicine. What should I watch for while using this medicine? Your condition will be monitored carefully while you are receiving this medicine. You may need blood work done while you are taking this medicine. Talk to your health care provider about your risk of cancer. You may be more at risk for certain types of cancer if you take this medicine. If you are going to need a MRI, CT scan, or other procedure, tell your doctor that you are using this medicine (On-Body Injector only). What side effects may I notice from receiving this medicine? Side effects that you should report to your doctor or health care professional as soon as possible:  allergic reactions (skin rash, itching or hives, swelling of   the face, lips, or tongue)  back pain  dizziness  fever  pain, redness, or irritation at site where injected  pinpoint red spots on the skin  red or dark-brown urine  shortness of breath or breathing problems  stomach or side pain, or pain at the shoulder  swelling  tiredness  trouble  passing urine or change in the amount of urine  unusual bruising or bleeding Side effects that usually do not require medical attention (report to your doctor or health care professional if they continue or are bothersome):  bone pain  muscle pain This list may not describe all possible side effects. Call your doctor for medical advice about side effects. You may report side effects to FDA at 1-800-FDA-1088. Where should I keep my medicine? Keep out of the reach of children. If you are using this medicine at home, you will be instructed on how to store it. Throw away any unused medicine after the expiration date on the label. NOTE: This sheet is a summary. It may not cover all possible information. If you have questions about this medicine, talk to your doctor, pharmacist, or health care provider.  2021 Elsevier/Gold Standard (2019-06-21 13:20:51)  

## 2020-10-27 ENCOUNTER — Other Ambulatory Visit: Payer: Medicare PPO

## 2020-10-27 ENCOUNTER — Inpatient Hospital Stay: Payer: Medicare PPO

## 2020-10-27 ENCOUNTER — Telehealth: Payer: Self-pay | Admitting: Hematology and Oncology

## 2020-10-27 ENCOUNTER — Other Ambulatory Visit: Payer: Self-pay

## 2020-10-27 ENCOUNTER — Encounter: Payer: Self-pay | Admitting: Hematology and Oncology

## 2020-10-27 ENCOUNTER — Inpatient Hospital Stay: Payer: Medicare PPO | Admitting: Hematology and Oncology

## 2020-10-27 VITALS — BP 149/71 | HR 101 | Temp 98.3°F | Resp 18 | Ht 63.0 in | Wt 144.6 lb

## 2020-10-27 VITALS — HR 92

## 2020-10-27 DIAGNOSIS — C8118 Nodular sclerosis classical Hodgkin lymphoma, lymph nodes of multiple sites: Secondary | ICD-10-CM

## 2020-10-27 DIAGNOSIS — G609 Hereditary and idiopathic neuropathy, unspecified: Secondary | ICD-10-CM | POA: Diagnosis not present

## 2020-10-27 DIAGNOSIS — D6481 Anemia due to antineoplastic chemotherapy: Secondary | ICD-10-CM

## 2020-10-27 DIAGNOSIS — D72825 Bandemia: Secondary | ICD-10-CM

## 2020-10-27 DIAGNOSIS — Z5111 Encounter for antineoplastic chemotherapy: Secondary | ICD-10-CM | POA: Diagnosis not present

## 2020-10-27 DIAGNOSIS — K219 Gastro-esophageal reflux disease without esophagitis: Secondary | ICD-10-CM

## 2020-10-27 DIAGNOSIS — T451X5A Adverse effect of antineoplastic and immunosuppressive drugs, initial encounter: Secondary | ICD-10-CM

## 2020-10-27 LAB — URIC ACID: Uric Acid, Serum: 3.4 mg/dL (ref 2.5–7.1)

## 2020-10-27 LAB — CBC WITH DIFFERENTIAL (CANCER CENTER ONLY)
Abs Immature Granulocytes: 1.09 10*3/uL — ABNORMAL HIGH (ref 0.00–0.07)
Basophils Absolute: 0.1 10*3/uL (ref 0.0–0.1)
Basophils Relative: 1 %
Eosinophils Absolute: 0.2 10*3/uL (ref 0.0–0.5)
Eosinophils Relative: 2 %
HCT: 31.8 % — ABNORMAL LOW (ref 36.0–46.0)
Hemoglobin: 10.3 g/dL — ABNORMAL LOW (ref 12.0–15.0)
Immature Granulocytes: 8 %
Lymphocytes Relative: 11 %
Lymphs Abs: 1.7 10*3/uL (ref 0.7–4.0)
MCH: 28.2 pg (ref 26.0–34.0)
MCHC: 32.4 g/dL (ref 30.0–36.0)
MCV: 87.1 fL (ref 80.0–100.0)
Monocytes Absolute: 1.3 10*3/uL — ABNORMAL HIGH (ref 0.1–1.0)
Monocytes Relative: 9 %
Neutro Abs: 10.1 10*3/uL — ABNORMAL HIGH (ref 1.7–7.7)
Neutrophils Relative %: 69 %
Platelet Count: 273 10*3/uL (ref 150–400)
RBC: 3.65 MIL/uL — ABNORMAL LOW (ref 3.87–5.11)
RDW: 17.2 % — ABNORMAL HIGH (ref 11.5–15.5)
WBC Count: 14.5 10*3/uL — ABNORMAL HIGH (ref 4.0–10.5)
nRBC: 0.2 % (ref 0.0–0.2)

## 2020-10-27 LAB — CMP (CANCER CENTER ONLY)
ALT: 14 U/L (ref 0–44)
AST: 25 U/L (ref 15–41)
Albumin: 3.7 g/dL (ref 3.5–5.0)
Alkaline Phosphatase: 67 U/L (ref 38–126)
Anion gap: 11 (ref 5–15)
BUN: 9 mg/dL (ref 8–23)
CO2: 27 mmol/L (ref 22–32)
Calcium: 7.7 mg/dL — ABNORMAL LOW (ref 8.9–10.3)
Chloride: 103 mmol/L (ref 98–111)
Creatinine: 0.71 mg/dL (ref 0.44–1.00)
GFR, Estimated: >60 mL/min (ref >60)
Glucose, Bld: 115 mg/dL — ABNORMAL HIGH (ref 70–99)
Potassium: 3.4 mmol/L — ABNORMAL LOW (ref 3.5–5.1)
Sodium: 141 mmol/L (ref 135–145)
Total Bilirubin: 0.3 mg/dL (ref 0.3–1.2)
Total Protein: 6.3 g/dL — ABNORMAL LOW (ref 6.5–8.1)

## 2020-10-27 LAB — LACTATE DEHYDROGENASE: LDH: 269 U/L — ABNORMAL HIGH (ref 98–192)

## 2020-10-27 LAB — SEDIMENTATION RATE: Sed Rate: 15 mm/hr (ref 0–22)

## 2020-10-27 MED ORDER — SODIUM CHLORIDE 0.9 % IV SOLN
375.0000 mg/m2 | Freq: Once | INTRAVENOUS | Status: AC
  Administered 2020-10-27: 640 mg via INTRAVENOUS
  Filled 2020-10-27: qty 64

## 2020-10-27 MED ORDER — DOXORUBICIN HCL CHEMO IV INJECTION 2 MG/ML
25.0000 mg/m2 | Freq: Once | INTRAVENOUS | Status: AC
  Administered 2020-10-27: 42 mg via INTRAVENOUS
  Filled 2020-10-27: qty 21

## 2020-10-27 MED ORDER — SODIUM CHLORIDE 0.9% FLUSH
10.0000 mL | Freq: Once | INTRAVENOUS | Status: AC
  Administered 2020-10-27: 10 mL
  Filled 2020-10-27: qty 10

## 2020-10-27 MED ORDER — SODIUM CHLORIDE 0.9 % IV SOLN
1.2000 mg/kg | Freq: Once | INTRAVENOUS | Status: AC
  Administered 2020-10-27: 80 mg via INTRAVENOUS
  Filled 2020-10-27: qty 16

## 2020-10-27 MED ORDER — VINBLASTINE SULFATE CHEMO INJECTION 1 MG/ML
3.0000 mg/m2 | Freq: Once | INTRAVENOUS | Status: AC
  Administered 2020-10-27: 5.1 mg via INTRAVENOUS
  Filled 2020-10-27: qty 5.1

## 2020-10-27 MED ORDER — ACETAMINOPHEN 325 MG PO TABS
ORAL_TABLET | ORAL | Status: AC
  Filled 2020-10-27: qty 2

## 2020-10-27 MED ORDER — ACETAMINOPHEN 325 MG PO TABS
650.0000 mg | ORAL_TABLET | Freq: Once | ORAL | Status: AC
  Administered 2020-10-27: 650 mg via ORAL

## 2020-10-27 MED ORDER — DIPHENHYDRAMINE HCL 50 MG/ML IJ SOLN
INTRAMUSCULAR | Status: AC
  Filled 2020-10-27: qty 1

## 2020-10-27 MED ORDER — PALONOSETRON HCL INJECTION 0.25 MG/5ML
0.2500 mg | Freq: Once | INTRAVENOUS | Status: AC
Start: 2020-10-27 — End: 2020-10-27
  Administered 2020-10-27: 0.25 mg via INTRAVENOUS

## 2020-10-27 MED ORDER — PALONOSETRON HCL INJECTION 0.25 MG/5ML
INTRAVENOUS | Status: AC
  Filled 2020-10-27: qty 5

## 2020-10-27 MED ORDER — SODIUM CHLORIDE 0.9% FLUSH
10.0000 mL | INTRAVENOUS | Status: DC | PRN
  Administered 2020-10-27: 10 mL
  Filled 2020-10-27: qty 10

## 2020-10-27 MED ORDER — HEPARIN SOD (PORK) LOCK FLUSH 100 UNIT/ML IV SOLN
500.0000 [IU] | Freq: Once | INTRAVENOUS | Status: AC | PRN
  Administered 2020-10-27: 500 [IU]
  Filled 2020-10-27: qty 5

## 2020-10-27 MED ORDER — SODIUM CHLORIDE 0.9 % IV SOLN
Freq: Once | INTRAVENOUS | Status: AC
  Filled 2020-10-27: qty 250

## 2020-10-27 MED ORDER — DEXAMETHASONE SODIUM PHOSPHATE 100 MG/10ML IJ SOLN
10.0000 mg | Freq: Once | INTRAMUSCULAR | Status: AC
  Administered 2020-10-27: 10 mg via INTRAVENOUS
  Filled 2020-10-27: qty 10

## 2020-10-27 MED ORDER — DIPHENHYDRAMINE HCL 50 MG/ML IJ SOLN
12.5000 mg | Freq: Once | INTRAMUSCULAR | Status: AC
  Administered 2020-10-27: 12.5 mg via INTRAVENOUS

## 2020-10-27 MED ORDER — SODIUM CHLORIDE 0.9 % IV SOLN
150.0000 mg | Freq: Once | INTRAVENOUS | Status: AC
  Administered 2020-10-27: 150 mg via INTRAVENOUS
  Filled 2020-10-27: qty 150

## 2020-10-27 NOTE — Telephone Encounter (Signed)
Schedule appointment per 05/17 sch msg. Patient received calender.

## 2020-10-27 NOTE — Assessment & Plan Note (Signed)

## 2020-10-27 NOTE — Assessment & Plan Note (Signed)
Neuropathy is stable Observe for now 

## 2020-10-27 NOTE — Assessment & Plan Note (Signed)
I recommend she continues on omeprazole in the morning and add Pepcid or Zantac at nighttime

## 2020-10-27 NOTE — Assessment & Plan Note (Signed)
This is due to recent G-CSF support Observe for now

## 2020-10-27 NOTE — Patient Instructions (Signed)
[facility]  Discharge Instructions: Thank you for choosing [facility] to provide your oncology and hematology care.   If you have a lab appointment with the [facility], please go directly to the [facility] and check in at the registration area.   Wear comfortable clothing and clothing appropriate for easy access to any Portacath or PICC line.   We strive to give you quality time with your provider. You may need to reschedule your appointment if you arrive late (15 or more minutes).  Arriving late affects you and other patients whose appointments are after yours.  Also, if you miss three or more appointments without notifying the office, you may be dismissed from the clinic at the provider's discretion.      For prescription refill requests, have your pharmacy contact our office and allow 72 hours for refills to be completed.    Today you received the following chemotherapy and/or immunotherapy agents doxorubicin, vinblastine, dacarbazine, brentuximab    To help prevent nausea and vomiting after your treatment, we encourage you to take your nausea medication as directed.  BELOW ARE SYMPTOMS THAT SHOULD BE REPORTED IMMEDIATELY: . *FEVER GREATER THAN 100.4 F (38 C) OR HIGHER . *CHILLS OR SWEATING . *NAUSEA AND VOMITING THAT IS NOT CONTROLLED WITH YOUR NAUSEA MEDICATION . *UNUSUAL SHORTNESS OF BREATH . *UNUSUAL BRUISING OR BLEEDING . *URINARY PROBLEMS (pain or burning when urinating, or frequent urination) . *BOWEL PROBLEMS (unusual diarrhea, constipation, pain near the anus) . TENDERNESS IN MOUTH AND THROAT WITH OR WITHOUT PRESENCE OF ULCERS (sore throat, sores in mouth, or a toothache) . UNUSUAL RASH, SWELLING OR PAIN  . UNUSUAL VAGINAL DISCHARGE OR ITCHING   Items with * indicate a potential emergency and should be followed up as soon as possible or go to the Emergency Department if any problems should occur.  Please show the  CHEMOTHERAPY ALERT CARD or IMMUNOTHERAPY ALERT CARD at check-in to the Emergency Department and triage nurse.  Should you have questions after your visit or need to cancel or reschedule your appointment, please contact [facility]  Dept: [phone]  and follow the prompts.  Office hours are 8:00 a.m. to 4:30 p.m. Monday - Friday. Please note that voicemails left after 4:00 p.m. may not be returned until the following business day.  We are closed weekends and major holidays. You have access to a nurse at all times for urgent questions. Please call the main number to the clinic Dept: [phone] and follow the prompts.   For any non-urgent questions, you may also contact your provider using MyChart. We now offer e-Visits for anyone 46 and older to request care online for non-urgent symptoms. For details visit [email or web address].   Also download the MyChart app! Go to the app store, search "MyChart", open the app, select [facility], and log in with your MyChart username and password.  Due to Covid, a mask is required upon entering the hospital/clinic. If you do not have a mask, one will be given to you upon arrival. For doctor visits, patients may have 1 support person aged 59 or older with them. For treatment visits, patients cannot have anyone with them due to current Covid guidelines and our immunocompromised population.

## 2020-10-27 NOTE — Progress Notes (Signed)
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 The patient denies recent history of bleeding such as epistaxis, hematuria or hematochezia. She is asymptomatic from the anemia. I will observe for now.  She does not require transfusion now. I will continue the chemotherapy at current dose without dosage adjustment.  If the anemia gets progressive worse in the future, I might have to delay her treatment or adjust the chemotherapy dose.   Leukocytosis This is due to recent G-CSF support Observe for now  Peripheral neuropathy Neuropathy is stable Observe for now  GERD (gastroesophageal reflux disease) I recommend she continues on omeprazole in the morning and add Pepcid or Zantac at nighttime   Orders Placed This Encounter  Procedures  . NM PET Image Restage (PS) Skull Base to Thigh    Standing Status:   Future    Standing Expiration Date:   10/27/2021    Order Specific Question:   If indicated for the ordered procedure, I authorize the administration of a radiopharmaceutical per Radiology protocol    Answer:   Yes    Order Specific Question:   Preferred imaging location?    Answer:   [facility]    Order Specific Question:   Radiology Contrast Protocol - do NOT remove file  path    Answer:   [email or web address]    All questions were answered. The patient knows to call the clinic with any problems, questions or concerns. The total time spent in the appointment was 20 minutes encounter with patients including review of chart and various tests results, discussions about plan of care and coordination of care plan   Heath Lark, MD 10/27/2020 2:30 PM  INTERVAL HISTORY: Please see below for problem oriented charting. She returns for treatment and follow-up She is doing well She has noted reflux symptoms especially after meals. 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 Extensive hypermetabolic lymphadenopathy involving the neck, chest and abdomen as detailed above ([place] 5). I do not see any pelvic disease. 2. Possible involvement of the spleen. 3. Osseous involvement is also noted.     09/03/2020 Pathology Results   A. LYMPH NODE, LEFT NECK, EXCISION:  -Lymphoproliferative disorder consistent with classical Hodgkin lymphoma -See comment   COMMENT:   The sections show effacement of the lymph nodal architecture by a primarily nodular lymphoproliferative process.  In the more cellular areas, the nodules are composed of a mixture of small lymphocytes, plasma cells, abundant histiocytes in addition to atypical large mononuclear and multi-lobated lymphoid appearing  cells with variably prominent nucleoli.  The nodules are surrounded by dense collagenous fibrosis.  In the less cellular areas, there appears to be a fibroblastic proliferation and a much more depleted cellular appearance but scattering of large atypical lymphoid appearing cells is still seen.   Flow cytometric analysis was performed [facility] [id number]) and shows predominance of T lymphocytes with nonspecific changes including relative abundance of CD8 positive cells and reversal of the CD4: CD8 ratio.  No monoclonal B-cell population identified.  A battery of immunohistochemical stains was performed and shows that the large atypical lymphoid appearing cells are positive for CD30, Mum-1, PAX 5 (weak), CD23 in addition to variable but generally weak positivity for CD79a and CD20.  Only scattered large atypical lymphoid cells show weak Golgi positivity for CD15.  The large atypical lymphoid cells are negative for LCA, CD10, CD3, CD5, EMA, ALK protein and EBV in situ  hybridization.  The small lymphoid cells in the background show a mixture of T and B cells with predominance of T cells.  T cells show a mixture of CD4 and CD8 positive cells with slight predominance of the latter.  The overall findings are most consistent with classical Hodgkin lymphoma, nodular sclerosis subtype.      09/21/2020 Procedure   Placement of a subcutaneous port device. Catheter tip at the superior cavoatrial junction.   09/25/2020 Echocardiogram   1. Left ventricular ejection fraction, by estimation, is 60 to 65%. The left ventricle has normal function. The left ventricle has no regional wall motion abnormalities. There is mild concentric left ventricular hypertrophy. Left ventricular diastolic parameters are consistent with Grade I diastolic dysfunction (impaired relaxation). The average left ventricular global longitudinal strain is -19.6 %. The global longitudinal strain is normal.  2. Right ventricular systolic function is normal.  The right ventricular size is normal.  3. A small pericardial effusion is present. The pericardial effusion is surrounding the apex and anterior to the right ventricle. There is no evidence of cardiac tamponade.  4. The mitral valve is normal in structure. No evidence of mitral valve regurgitation.  5. The aortic valve is tricuspid. Aortic valve regurgitation is not visualized. Mild aortic valve sclerosis is present, with no evidence of aortic valve stenosis.  6. The inferior vena cava is normal in size with greater than 50% respiratory variability, suggesting right atrial pressure of 3 mmHg.   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[place])   09/29/2020 -  Chemotherapy    Patient is on Treatment Plan: HODGKINS LYMPHOMA  A + AVD Q28D        REVIEW OF SYSTEMS:   Constitutional: Denies fevers, chills or abnormal weight loss Eyes: Denies blurriness of vision Ears, nose, mouth, throat, and face: Denies mucositis or sore throat Respiratory: Denies cough, dyspnea or wheezes Cardiovascular: Denies palpitation, chest discomfort or lower extremity swelling Gastrointestinal:  Denies nausea, heartburn or change in bowel habits Skin: Denies abnormal skin rashes Lymphatics: Denies new lymphadenopathy or easy bruising Neurological:Denies numbness, tingling or new weaknesses Behavioral/Psych: Mood is stable, no new changes  All other systems were reviewed with the patient and are negative.  I have reviewed the past medical history, past surgical history, social history and family history with the patient and they are unchanged from previous note.  ALLERGIES:  is allergic to codeine.  MEDICATIONS:  Current Outpatient Medications  Medication Sig Dispense Refill  . acyclovir (ZOVIRAX) 400 MG tablet Take 1 tablet (400 mg total) by mouth 2 (two) times daily. 60 tablet 6  . albuterol (VENTOLIN HFA) 108 (90 Base) MCG/ACT inhaler Inhale  2 puffs into the lungs every 6 (six) hours as needed for wheezing or shortness of breath. 8 g 0  . allopurinol (ZYLOPRIM) 300 MG tablet Take 1 tablet (300 mg total) by mouth daily. 30 tablet 1  . aspirin EC 81 MG tablet Take 81 mg by mouth daily.    Marland Kitchen atorvastatin (LIPITOR) 20 MG tablet TAKE 1 TABLET BY MOUTH ONCE DAILY 90 tablet 3  . BREO ELLIPTA 100-25 MCG/INH AEPB INHALE 1 PUFF INTO THE LUNGS DAILY 60 each 5  . cetirizine (ZYRTEC) 10 MG tablet TAKE 1 TABLET BY MOUTH ONCE DAILY 90 tablet 2  . denosumab (PROLIA) 60 MG/ML SOSY injection Inject 60 mg into the skin every 6 (six) months.    Marland Kitchen glucose blood (ACCU-CHEK AVIVA PLUS) test strip Use to Monitor blood sugars daily 100 each 5  . hydrochlorothiazide (MICROZIDE) 12.5 MG capsule Take 1 capsule (12.5 mg total) by mouth daily. 90 capsule 3  . lidocaine-prilocaine (EMLA) cream Apply to affected area once 30 g 3  . losartan (COZAAR) 100 MG tablet TAKE 1 TABLET BY MOUTH ONCE DAILY 90 tablet 2  . metoprolol succinate (TOPROL-XL) 50 MG 24 hr tablet TAKE 1 TABLET BY MOUTH ONCE A DAY WITH OR IMMEDIATELY FOLLOWING A MEAL 90 tablet 2  . Multiple Vitamin (MULTIVITAMIN) capsule Take 1 capsule by mouth daily.    Marland Kitchen omeprazole (PRILOSEC) 20 MG capsule TAKE 1 CAPSULE BY MOUTH ONCE DAILY 90 capsule 3  . ondansetron (ZOFRAN) 8 MG tablet Take 1 tablet (8 mg total) by mouth every 8 (eight) hours as needed. 30 tablet 1  . prochlorperazine (COMPAZINE) 10 MG tablet Take 1 tablet (10 mg total) by mouth every 6 (six) hours as needed (Nausea or vomiting). 30 tablet 1   Current Facility-Administered Medications  Medication Dose Route Frequency Provider Last Rate Last Admin  . denosumab (PROLIA) injection 60 mg  60 mg Subcutaneous Q6 months Buelah Manis, Kawanta F, MD   60 mg at 08/10/20 1000   Facility-Administered Medications Ordered in Other Visits  Medication Dose Route Frequency Provider Last Rate Last Admin  . brentuximab vedotin (ADCETRIS) 80 mg in sodium chloride 0.9  % 100 mL chemo infusion  1.2 mg/kg (Treatment Plan Recorded) Intravenous Once [name], Tawnee Clegg, MD      . dacarbazine (DTIC) 640 mg in sodium chloride 0.9 % 250 mL chemo infusion  375 mg/m2 (Treatment Plan Recorded) Intravenous Once Heath Lark, MD 314 mL/hr at 10/27/20 1357 640 mg at 10/27/20 1357  . heparin lock flush 100 unit/mL  500 Units Intracatheter Once PRN [name], Winfield Caba, MD      . sodium chloride flush (NS) 0.9 % injection 10 mL  10 mL Intracatheter PRN [name], Ammy Lienhard, MD        PHYSICAL EXAMINATION: ECOG PERFORMANCE STATUS: 1 - Symptomatic but completely ambulatory  Vitals:   10/27/20 1120  BP: (!) 149/71  Pulse: (!) 101  Resp: 18  Temp: 98.3 F (36.8 C)  SpO2: 99%   Filed Weights   10/27/20 1120  Weight: 144 lb 9.6 oz (65.6 kg)    GENERAL:alert, no distress and comfortable SKIN: skin color, texture, turgor are normal, no rashes or significant lesions EYES: normal, Conjunctiva are  pink and non-injected, sclera clear OROPHARYNX:no exudate, no erythema and lips, buccal mucosa, and tongue normal  NECK: supple, thyroid normal size, non-tender, without nodularity LYMPH:  no palpable lymphadenopathy in the cervical, axillary or inguinal LUNGS: clear to auscultation and percussion with normal breathing effort HEART: regular rate & rhythm and no murmurs and no lower extremity edema ABDOMEN:abdomen soft, non-tender and normal bowel sounds Musculoskeletal:no cyanosis of digits and no clubbing  NEURO: alert & oriented x 3 with fluent speech, no focal motor/sensory deficits  LABORATORY DATA:  I have reviewed the data as listed    Component Value Date/Time   NA 141 10/27/2020 1101   NA 143 08/15/2016 1037   K 3.4 (L) 10/27/2020 1101   K 4.8 08/15/2016 1037   CL 103 10/27/2020 1101   CL 103 07/16/2012 1304   CO2 27 10/27/2020 1101   CO2 27 08/15/2016 1037   GLUCOSE 115 (H) 10/27/2020 1101   GLUCOSE 111 08/15/2016 1037   GLUCOSE 112 (H) 07/16/2012 1304   BUN 9 10/27/2020 1101    BUN 12.7 08/15/2016 1037   CREATININE 0.71 10/27/2020 1101   CREATININE 0.95 (H) 08/10/2020 1041   CREATININE 0.9 08/15/2016 1037   CALCIUM 7.7 (L) 10/27/2020 1101   CALCIUM 10.3 08/13/2020 1117   CALCIUM 10.5 (H) 08/15/2016 1037   PROT 6.3 (L) 10/27/2020 1101   PROT 7.9 08/15/2016 1037   ALBUMIN 3.7 10/27/2020 1101   ALBUMIN 4.5 08/15/2016 1037   AST 25 10/27/2020 1101   AST 30 08/15/2016 1037   ALT 14 10/27/2020 1101   ALT 29 08/15/2016 1037   ALKPHOS 67 10/27/2020 1101   ALKPHOS 67 08/15/2016 1037   BILITOT 0.3 10/27/2020 1101   BILITOT 0.80 08/15/2016 1037   GFRNONAA >60 10/27/2020 1101   GFRNONAA 62 12/05/2017 1000   GFRAA 72 12/05/2017 1000    No results found for: SPEP, UPEP  Lab Results  Component Value Date   WBC 14.5 (H) 10/27/2020   NEUTROABS 10.1 (H) 10/27/2020   HGB 10.3 (L) 10/27/2020   HCT 31.8 (L) 10/27/2020   MCV 87.1 10/27/2020   PLT 273 10/27/2020      Chemistry      Component Value Date/Time   NA 141 10/27/2020 1101   NA 143 08/15/2016 1037   K 3.4 (L) 10/27/2020 1101   K 4.8 08/15/2016 1037   CL 103 10/27/2020 1101   CL 103 07/16/2012 1304   CO2 27 10/27/2020 1101   CO2 27 08/15/2016 1037   BUN 9 10/27/2020 1101   BUN 12.7 08/15/2016 1037   CREATININE 0.71 10/27/2020 1101   CREATININE 0.95 (H) 08/10/2020 1041   CREATININE 0.9 08/15/2016 1037      Component Value Date/Time   CALCIUM 7.7 (L) 10/27/2020 1101   CALCIUM 10.3 08/13/2020 1117   CALCIUM 10.5 (H) 08/15/2016 1037   ALKPHOS 67 10/27/2020 1101   ALKPHOS 67 08/15/2016 1037   AST 25 10/27/2020 1101   AST 30 08/15/2016 1037   ALT 14 10/27/2020 1101   ALT 29 08/15/2016 1037   BILITOT 0.3 10/27/2020 1101   BILITOT 0.80 08/15/2016 1037

## 2020-10-27 NOTE — Assessment & Plan Note (Signed)
She tolerated chemotherapy very well with no side effects except for very mild anemia and stable peripheral neuropathy Her night sweats and fever had resolved She has no palpable lymphadenopathy on exam today We will proceed with treatment without delay She needs minimum 2 cycles of treatment, 4 doses of chemotherapy before repeat imaging study

## 2020-10-29 ENCOUNTER — Inpatient Hospital Stay: Payer: Medicare PPO

## 2020-10-29 ENCOUNTER — Other Ambulatory Visit: Payer: Self-pay

## 2020-10-29 VITALS — BP 124/75 | HR 84 | Temp 98.5°F | Resp 16

## 2020-10-29 DIAGNOSIS — Z5111 Encounter for antineoplastic chemotherapy: Secondary | ICD-10-CM | POA: Diagnosis not present

## 2020-10-29 DIAGNOSIS — C8118 Nodular sclerosis classical Hodgkin lymphoma, lymph nodes of multiple sites: Secondary | ICD-10-CM

## 2020-10-29 MED ORDER — PEGFILGRASTIM-JMDB 6 MG/0.6ML ~~LOC~~ SOSY
PREFILLED_SYRINGE | SUBCUTANEOUS | Status: AC
  Filled 2020-10-29: qty 0.6

## 2020-10-29 MED ORDER — PEGFILGRASTIM-JMDB 6 MG/0.6ML ~~LOC~~ SOSY
6.0000 mg | PREFILLED_SYRINGE | Freq: Once | SUBCUTANEOUS | Status: AC
  Administered 2020-10-29: 6 mg via SUBCUTANEOUS

## 2020-10-29 NOTE — Patient Instructions (Signed)
Pegfilgrastim injection What is this medicine? PEGFILGRASTIM (PEG fil gra stim) is a long-acting granulocyte colony-stimulating factor that stimulates the growth of neutrophils, a type of white blood cell important in the body's fight against infection. It is used to reduce the incidence of fever and infection in patients with certain types of cancer who are receiving chemotherapy that affects the bone marrow, and to increase survival after being exposed to high doses of radiation. This medicine may be used for other purposes; ask your health care provider or pharmacist if you have questions. COMMON BRAND NAME(S): Fulphila, Neulasta, Nyvepria, UDENYCA, Ziextenzo What should I tell my health care provider before I take this medicine? They need to know if you have any of these conditions:  kidney disease  latex allergy  ongoing radiation therapy  sickle cell disease  skin reactions to acrylic adhesives (On-Body Injector only)  an unusual or allergic reaction to pegfilgrastim, filgrastim, other medicines, foods, dyes, or preservatives  pregnant or trying to get pregnant  breast-feeding How should I use this medicine? This medicine is for injection under the skin. If you get this medicine at home, you will be taught how to prepare and give the pre-filled syringe or how to use the On-body Injector. Refer to the patient Instructions for Use for detailed instructions. Use exactly as directed. Tell your healthcare provider immediately if you suspect that the On-body Injector may not have performed as intended or if you suspect the use of the On-body Injector resulted in a missed or partial dose. It is important that you put your used needles and syringes in a special sharps container. Do not put them in a trash can. If you do not have a sharps container, call your pharmacist or healthcare provider to get one. Talk to your pediatrician regarding the use of this medicine in children. While this drug  may be prescribed for selected conditions, precautions do apply. Overdosage: If you think you have taken too much of this medicine contact a poison control center or emergency room at once. NOTE: This medicine is only for you. Do not share this medicine with others. What if I miss a dose? It is important not to miss your dose. Call your doctor or health care professional if you miss your dose. If you miss a dose due to an On-body Injector failure or leakage, a new dose should be administered as soon as possible using a single prefilled syringe for manual use. What may interact with this medicine? Interactions have not been studied. This list may not describe all possible interactions. Give your health care provider a list of all the medicines, herbs, non-prescription drugs, or dietary supplements you use. Also tell them if you smoke, drink alcohol, or use illegal drugs. Some items may interact with your medicine. What should I watch for while using this medicine? Your condition will be monitored carefully while you are receiving this medicine. You may need blood work done while you are taking this medicine. Talk to your health care provider about your risk of cancer. You may be more at risk for certain types of cancer if you take this medicine. If you are going to need a MRI, CT scan, or other procedure, tell your doctor that you are using this medicine (On-Body Injector only). What side effects may I notice from receiving this medicine? Side effects that you should report to your doctor or health care professional as soon as possible:  allergic reactions (skin rash, itching or hives, swelling of   the face, lips, or tongue)  back pain  dizziness  fever  pain, redness, or irritation at site where injected  pinpoint red spots on the skin  red or dark-brown urine  shortness of breath or breathing problems  stomach or side pain, or pain at the shoulder  swelling  tiredness  trouble  passing urine or change in the amount of urine  unusual bruising or bleeding Side effects that usually do not require medical attention (report to your doctor or health care professional if they continue or are bothersome):  bone pain  muscle pain This list may not describe all possible side effects. Call your doctor for medical advice about side effects. You may report side effects to FDA at 1-800-FDA-1088. Where should I keep my medicine? Keep out of the reach of children. If you are using this medicine at home, you will be instructed on how to store it. Throw away any unused medicine after the expiration date on the label. NOTE: This sheet is a summary. It may not cover all possible information. If you have questions about this medicine, talk to your doctor, pharmacist, or health care provider.  2021 Elsevier/Gold Standard (2019-06-21 13:20:51)  

## 2020-11-05 ENCOUNTER — Other Ambulatory Visit: Payer: Self-pay | Admitting: Family Medicine

## 2020-11-05 MED ORDER — CETIRIZINE HCL 10 MG PO TABS: 10.0000 mg | ORAL_TABLET | Freq: Every day | ORAL | 2 refills | Status: DC

## 2020-11-05 MED ORDER — METOPROLOL SUCCINATE ER 50 MG PO TB24: ORAL_TABLET | ORAL | 2 refills | Status: DC

## 2020-11-05 MED ORDER — LOSARTAN POTASSIUM 100 MG PO TABS: 1.0000 | ORAL_TABLET | Freq: Every day | ORAL | 2 refills | Status: DC

## 2020-11-05 NOTE — Addendum Note (Signed)
Addended by: Sheral Flow on: 11/05/2020 09:48 AM   Modules accepted: Orders

## 2020-11-06 MED FILL — Dexamethasone Sodium Phosphate Inj 100 MG/10ML: INTRAMUSCULAR | Qty: 1 | Status: AC

## 2020-11-10 ENCOUNTER — Inpatient Hospital Stay: Payer: Medicare PPO

## 2020-11-10 ENCOUNTER — Encounter: Payer: Self-pay | Admitting: Hematology and Oncology

## 2020-11-10 ENCOUNTER — Other Ambulatory Visit: Payer: Medicare PPO

## 2020-11-10 ENCOUNTER — Inpatient Hospital Stay: Payer: Medicare PPO | Admitting: Hematology and Oncology

## 2020-11-10 ENCOUNTER — Other Ambulatory Visit: Payer: Self-pay | Admitting: Hematology and Oncology

## 2020-11-10 ENCOUNTER — Other Ambulatory Visit: Payer: Self-pay

## 2020-11-10 DIAGNOSIS — C8118 Nodular sclerosis classical Hodgkin lymphoma, lymph nodes of multiple sites: Secondary | ICD-10-CM

## 2020-11-10 DIAGNOSIS — I10 Essential (primary) hypertension: Secondary | ICD-10-CM

## 2020-11-10 DIAGNOSIS — K219 Gastro-esophageal reflux disease without esophagitis: Secondary | ICD-10-CM | POA: Diagnosis not present

## 2020-11-10 DIAGNOSIS — G609 Hereditary and idiopathic neuropathy, unspecified: Secondary | ICD-10-CM

## 2020-11-10 DIAGNOSIS — Z5111 Encounter for antineoplastic chemotherapy: Secondary | ICD-10-CM | POA: Diagnosis not present

## 2020-11-10 DIAGNOSIS — T451X5A Adverse effect of antineoplastic and immunosuppressive drugs, initial encounter: Secondary | ICD-10-CM

## 2020-11-10 DIAGNOSIS — D6481 Anemia due to antineoplastic chemotherapy: Secondary | ICD-10-CM | POA: Diagnosis not present

## 2020-11-10 LAB — CMP (CANCER CENTER ONLY)
ALT: 15 U/L (ref 0–44)
AST: 21 U/L (ref 15–41)
Albumin: 3.9 g/dL (ref 3.5–5.0)
Alkaline Phosphatase: 81 U/L (ref 38–126)
Anion gap: 12 (ref 5–15)
BUN: 9 mg/dL (ref 8–23)
CO2: 23 mmol/L (ref 22–32)
Calcium: 8.3 mg/dL — ABNORMAL LOW (ref 8.9–10.3)
Chloride: 107 mmol/L (ref 98–111)
Creatinine: 0.74 mg/dL (ref 0.44–1.00)
GFR, Estimated: >60 mL/min (ref >60)
Glucose, Bld: 130 mg/dL — ABNORMAL HIGH (ref 70–99)
Potassium: 3.8 mmol/L (ref 3.5–5.1)
Sodium: 142 mmol/L (ref 135–145)
Total Bilirubin: 0.3 mg/dL (ref 0.3–1.2)
Total Protein: 6.4 g/dL — ABNORMAL LOW (ref 6.5–8.1)

## 2020-11-10 LAB — CBC WITH DIFFERENTIAL (CANCER CENTER ONLY)
Abs Immature Granulocytes: 0.92 10*3/uL — ABNORMAL HIGH (ref 0.00–0.07)
Basophils Absolute: 0.1 10*3/uL (ref 0.0–0.1)
Basophils Relative: 1 %
Eosinophils Absolute: 0.3 10*3/uL (ref 0.0–0.5)
Eosinophils Relative: 2 %
HCT: 34 % — ABNORMAL LOW (ref 36.0–46.0)
Hemoglobin: 10.9 g/dL — ABNORMAL LOW (ref 12.0–15.0)
Immature Granulocytes: 6 %
Lymphocytes Relative: 9 %
Lymphs Abs: 1.3 10*3/uL (ref 0.7–4.0)
MCH: 29 pg (ref 26.0–34.0)
MCHC: 32.1 g/dL (ref 30.0–36.0)
MCV: 90.4 fL (ref 80.0–100.0)
Monocytes Absolute: 1.3 10*3/uL — ABNORMAL HIGH (ref 0.1–1.0)
Monocytes Relative: 8 %
Neutro Abs: 11.6 10*3/uL — ABNORMAL HIGH (ref 1.7–7.7)
Neutrophils Relative %: 74 %
Platelet Count: 257 10*3/uL (ref 150–400)
RBC: 3.76 MIL/uL — ABNORMAL LOW (ref 3.87–5.11)
RDW: 20.3 % — ABNORMAL HIGH (ref 11.5–15.5)
WBC Count: 15.5 10*3/uL — ABNORMAL HIGH (ref 4.0–10.5)
nRBC: 0.3 % — ABNORMAL HIGH (ref 0.0–0.2)

## 2020-11-10 MED ORDER — ACETAMINOPHEN 325 MG PO TABS
650.0000 mg | ORAL_TABLET | Freq: Once | ORAL | Status: AC
  Administered 2020-11-10: 650 mg via ORAL

## 2020-11-10 MED ORDER — PALONOSETRON HCL INJECTION 0.25 MG/5ML
INTRAVENOUS | Status: AC
  Filled 2020-11-10: qty 5

## 2020-11-10 MED ORDER — DIPHENHYDRAMINE HCL 50 MG/ML IJ SOLN
12.5000 mg | Freq: Once | INTRAMUSCULAR | Status: AC
  Administered 2020-11-10: 12.5 mg via INTRAVENOUS

## 2020-11-10 MED ORDER — HEPARIN SOD (PORK) LOCK FLUSH 100 UNIT/ML IV SOLN
500.0000 [IU] | Freq: Once | INTRAVENOUS | Status: AC | PRN
  Administered 2020-11-10: 500 [IU]
  Filled 2020-11-10: qty 5

## 2020-11-10 MED ORDER — DEXAMETHASONE SODIUM PHOSPHATE 10 MG/ML IJ SOLN
5.0000 mg | Freq: Once | INTRAMUSCULAR | Status: AC
Start: 2020-11-10 — End: 2020-11-10
  Administered 2020-11-10: 5 mg via INTRAVENOUS

## 2020-11-10 MED ORDER — SODIUM CHLORIDE 0.9 % IV SOLN
375.0000 mg/m2 | Freq: Once | INTRAVENOUS | Status: AC
  Administered 2020-11-10: 630 mg via INTRAVENOUS
  Filled 2020-11-10: qty 63

## 2020-11-10 MED ORDER — PALONOSETRON HCL INJECTION 0.25 MG/5ML
0.2500 mg | Freq: Once | INTRAVENOUS | Status: AC
  Administered 2020-11-10: 0.25 mg via INTRAVENOUS

## 2020-11-10 MED ORDER — DIPHENHYDRAMINE HCL 50 MG/ML IJ SOLN
INTRAMUSCULAR | Status: AC
  Filled 2020-11-10: qty 1

## 2020-11-10 MED ORDER — FOSAPREPITANT DIMEGLUMINE INJECTION 150 MG
150.0000 mg | Freq: Once | INTRAVENOUS | Status: AC
  Administered 2020-11-10: 150 mg via INTRAVENOUS
  Filled 2020-11-10: qty 150

## 2020-11-10 MED ORDER — ACETAMINOPHEN 325 MG PO TABS
ORAL_TABLET | ORAL | Status: AC
  Filled 2020-11-10: qty 2

## 2020-11-10 MED ORDER — DOXORUBICIN HCL CHEMO IV INJECTION 2 MG/ML
25.0000 mg/m2 | Freq: Once | INTRAVENOUS | Status: AC
  Administered 2020-11-10: 42 mg via INTRAVENOUS
  Filled 2020-11-10: qty 21

## 2020-11-10 MED ORDER — VINBLASTINE SULFATE CHEMO INJECTION 1 MG/ML
3.0000 mg/m2 | Freq: Once | INTRAVENOUS | Status: AC
  Administered 2020-11-10: 5.1 mg via INTRAVENOUS
  Filled 2020-11-10: qty 5.1

## 2020-11-10 MED ORDER — SODIUM CHLORIDE 0.9% FLUSH
10.0000 mL | INTRAVENOUS | Status: DC | PRN
  Administered 2020-11-10: 10 mL
  Filled 2020-11-10: qty 10

## 2020-11-10 MED ORDER — SODIUM CHLORIDE 0.9 % IV SOLN
Freq: Once | INTRAVENOUS | Status: AC
Start: 2020-11-10 — End: 2020-11-10
  Filled 2020-11-10: qty 250

## 2020-11-10 MED ORDER — DEXAMETHASONE SODIUM PHOSPHATE 10 MG/ML IJ SOLN
INTRAMUSCULAR | Status: AC
  Filled 2020-11-10: qty 1

## 2020-11-10 MED ORDER — SODIUM CHLORIDE 0.9% FLUSH
10.0000 mL | Freq: Once | INTRAVENOUS | Status: AC
  Administered 2020-11-10: 10 mL
  Filled 2020-11-10: qty 10

## 2020-11-10 MED ORDER — SODIUM CHLORIDE 0.9 % IV SOLN
0.9600 mg/kg | Freq: Once | INTRAVENOUS | Status: AC
  Administered 2020-11-10: 60 mg via INTRAVENOUS
  Filled 2020-11-10: qty 12

## 2020-11-10 NOTE — Assessment & Plan Note (Signed)
She tolerated treatment well except for slight worsening neuropathy in reflux symptoms Plan to reduce dexamethasone to 50% dose I will reduce the dose of brentuximab vedotin a bit She is scheduled for PET CT scan next month for objective assessment of response to treatment

## 2020-11-10 NOTE — Assessment & Plan Note (Addendum)
she has mild peripheral neuropathy, likely related to side effects of treatment. °I plan to reduce the dose of treatment as outlined above.  °I explained to the patient the rationale of this strategy and reassured the patient it would not compromise the efficacy of treatment ° °

## 2020-11-10 NOTE — Assessment & Plan Note (Signed)
This is likely exacerbated by high-dose steroids Plan to reduce dexamethasone She will continue antiacid

## 2020-11-10 NOTE — Progress Notes (Signed)
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 The patient denies recent history of bleeding such as epistaxis, hematuria or hematochezia. She is asymptomatic from the anemia. I will observe for now.  She does not require transfusion now. I will continue the chemotherapy at current dose without dosage adjustment.  If the anemia gets progressive worse in the future, I might have to delay her treatment or adjust the chemotherapy dose.   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 I plan to reduce the dose of treatment as outlined above.  I explained to the patient the rationale of this strategy and reassured the patient it would not compromise the efficacy of treatment    No orders of the defined types were placed in this encounter.   All questions were answered. The patient knows to call the clinic with any problems, questions or concerns. The total time spent in the appointment was 30 minutes encounter with patients including  review of chart and various tests results, discussions about plan of care and coordination of care plan   Heath Lark, MD 11/10/2020 10:01 AM  INTERVAL HISTORY: Please see below for problem oriented charting. 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 Extensive hypermetabolic lymphadenopathy involving the neck, chest and abdomen as detailed above ([place] 5). I do not see any pelvic disease. 2. Possible involvement of the spleen. 3. Osseous involvement is also noted.     09/03/2020 Pathology Results   A. LYMPH NODE, LEFT NECK, EXCISION:  -Lymphoproliferative disorder consistent with classical Hodgkin lymphoma -See comment   COMMENT:   The sections show effacement of the lymph nodal architecture by a primarily nodular lymphoproliferative process.  In the more cellular areas, the nodules are composed of a mixture of small lymphocytes, plasma cells, abundant histiocytes in addition to atypical large mononuclear and multi-lobated lymphoid appearing cells with variably prominent nucleoli.  The nodules are surrounded by dense collagenous fibrosis.  In the less cellular areas, there appears to be a fibroblastic proliferation and a much more depleted cellular  appearance but scattering of large atypical lymphoid appearing cells is  still seen.   Flow cytometric analysis was performed [facility] [phone]) and shows predominance of T lymphocytes with nonspecific changes including relative abundance of CD8 positive cells and reversal of the CD4: CD8 ratio.  No monoclonal B-cell population identified.  A battery of immunohistochemical stains was performed and shows that the large atypical lymphoid appearing cells are positive for CD30, Mum-1, PAX 5 (weak), CD23 in addition to variable but generally weak positivity for CD79a and CD20.  Only scattered large atypical lymphoid cells show weak Golgi positivity for CD15.  The large atypical lymphoid cells are negative for LCA, CD10, CD3, CD5, EMA, ALK protein and EBV in situ  hybridization.  The small lymphoid cells in the background show a mixture of T and B cells with predominance of T cells.  T cells show a mixture of CD4 and CD8 positive cells with slight predominance of the latter.  The overall findings are most consistent with classical Hodgkin lymphoma, nodular sclerosis subtype.      09/21/2020 Procedure   Placement of a subcutaneous port device. Catheter tip at the superior cavoatrial junction.   09/25/2020 Echocardiogram   1. Left ventricular ejection fraction, by estimation, is 60 to 65%. The left ventricle has normal function. The left ventricle has no regional wall motion abnormalities. There is mild concentric left ventricular hypertrophy. Left ventricular diastolic parameters are consistent with Grade I diastolic dysfunction (impaired relaxation). The average left ventricular global longitudinal strain is -19.6 %. The global longitudinal strain is normal.  2. Right ventricular systolic function is normal. The right ventricular size is normal.  3. A small pericardial effusion is present. The pericardial effusion is surrounding the apex and anterior to the right ventricle. There is no evidence of cardiac tamponade.  4. The mitral valve is normal in structure. No evidence of mitral  valve regurgitation.  5. The aortic valve is tricuspid. Aortic valve regurgitation is not visualized. Mild aortic valve sclerosis is present, with no evidence of aortic valve stenosis.  6. The inferior vena cava is normal in size with greater than 50% respiratory variability, suggesting right atrial pressure of 3 mmHg.    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[place])  09/14/2020 Initial Diagnosis   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[place])   09/29/2020 -  Chemotherapy    Patient is on Treatment Plan: HODGKINS LYMPHOMA  A + AVD Q28D        REVIEW OF SYSTEMS:   Constitutional: Denies fevers, chills or abnormal weight loss Eyes: Denies blurriness of vision Ears, nose, mouth, throat, and face: Denies mucositis or sore throat Respiratory: Denies cough, dyspnea or wheezes Cardiovascular: Denies palpitation, chest discomfort or lower extremity swelling Skin: Denies abnormal skin rashes Lymphatics: Denies new lymphadenopathy or easy bruising Behavioral/Psych: Mood is stable, no new changes  All other systems were reviewed with the patient and are negative.  I have reviewed the past medical history, past surgical history, social history and family history with the patient and they are unchanged from previous note.  ALLERGIES:  is allergic to codeine.  MEDICATIONS:  Current Outpatient Medications  Medication Sig Dispense Refill  . acyclovir (ZOVIRAX) 400 MG tablet Take 1 tablet (400 mg total) by mouth 2 (two) times daily. 60 tablet 6  . albuterol (VENTOLIN HFA) 108 (90 Base) MCG/ACT inhaler Inhale 2 puffs into the lungs  every 6 (six) hours as needed for wheezing or shortness of breath. 8 g 0  . aspirin EC 81 MG tablet Take 81 mg by mouth daily.    Marland Kitchen atorvastatin (LIPITOR) 20 MG tablet TAKE 1 TABLET BY MOUTH ONCE DAILY 90 tablet 3  . BREO ELLIPTA 100-25 MCG/INH AEPB INHALE 1 PUFF INTO THE LUNGS DAILY 60 each  5  . cetirizine (ZYRTEC) 10 MG tablet Take 1 tablet (10 mg total) by mouth daily. 90 tablet 2  . denosumab (PROLIA) 60 MG/ML SOSY injection Inject 60 mg into the skin every 6 (six) months.    Marland Kitchen glucose blood (ACCU-CHEK AVIVA PLUS) test strip Use to Monitor blood sugars daily 100 each 5  . hydrochlorothiazide (MICROZIDE) 12.5 MG capsule Take 1 capsule (12.5 mg total) by mouth daily. 90 capsule 3  . lidocaine-prilocaine (EMLA) cream Apply to affected area once 30 g 3  . losartan (COZAAR) 100 MG tablet Take 1 tablet (100 mg total) by mouth daily. 90 tablet 2  . metoprolol succinate (TOPROL-XL) 50 MG 24 hr tablet Take with or immediately following a meal. 90 tablet 2  . Multiple Vitamin (MULTIVITAMIN) capsule Take 1 capsule by mouth daily.    Marland Kitchen omeprazole (PRILOSEC) 20 MG capsule TAKE 1 CAPSULE BY MOUTH ONCE DAILY 90 capsule 3  . ondansetron (ZOFRAN) 8 MG tablet Take 1 tablet (8 mg total) by mouth every 8 (eight) hours as needed. 30 tablet 1  . prochlorperazine (COMPAZINE) 10 MG tablet Take 1 tablet (10 mg total) by mouth every 6 (six) hours as needed (Nausea or vomiting). 30 tablet 1   Current Facility-Administered Medications  Medication Dose Route Frequency Provider Last Rate Last Admin  . denosumab (PROLIA) injection 60 mg  60 mg Subcutaneous Q6 months Buelah Manis, Kawanta F, MD   60 mg at 08/10/20 1000   Facility-Administered Medications Ordered in Other Visits  Medication Dose Route Frequency Provider Last Rate Last Admin  . acetaminophen (TYLENOL) tablet 650 mg  650 mg Oral Once [name], Shauntea Lok, MD      . brentuximab vedotin (ADCETRIS) 60 mg in sodium chloride 0.9 % 100 mL chemo infusion  0.96 mg/kg (Treatment Plan Recorded) Intravenous Once [name], Jovin Fester, MD      . dacarbazine (DTIC) 630 mg in sodium chloride 0.9 % 250 mL chemo infusion  375 mg/m2 (Treatment Plan Recorded) Intravenous Once [name], Dany Walther, MD      . dexamethasone (DECADRON) injection 5 mg  5 mg Intravenous Once [name], Jenia Klepper, MD      .  diphenhydrAMINE (BENADRYL) injection 12.5 mg  12.5 mg Intravenous Once [name], Titan Karner, MD      . DOXOrubicin (ADRIAMYCIN) chemo injection 42 mg  25 mg/m2 (Treatment Plan Recorded) Intravenous Once [name], Dajon Lazar, MD      . fosaprepitant (EMEND) 150 mg in sodium chloride 0.9 % 145 mL IVPB  150 mg Intravenous Once [name], Didier Brandenburg, MD      . heparin lock flush 100 unit/mL  500 Units Intracatheter Once PRN [name], Lavel Rieman, MD      . sodium chloride flush (NS) 0.9 % injection 10 mL  10 mL Intracatheter PRN Yasuo Phimmasone, MD      . vinBLAStine (VELBAN) 5.1 mg in sodium chloride 0.9 % 50 mL chemo infusion  3 mg/m2 (Treatment Plan Recorded) Intravenous Once [name], Jaclene Bartelt, MD        PHYSICAL EXAMINATION: ECOG PERFORMANCE STATUS: 1 - Symptomatic but completely ambulatory  Vitals:   11/10/20 0853  BP: (!) 163/84  Pulse: 84  Resp: 18  SpO2: 99%   Filed Weights   11/10/20 0853  Weight: 142 lb 3.2 oz (64.5 kg)    GENERAL:alert, no distress and comfortable SKIN: skin color, texture, turgor are normal, no rashes or significant lesions EYES: normal, Conjunctiva are pink and non-injected, sclera clear OROPHARYNX:no exudate, no erythema and lips, buccal mucosa, and tongue normal  NECK: supple, thyroid normal size, non-tender, without nodularity LYMPH:  no palpable lymphadenopathy in the cervical, axillary or inguinal LUNGS: clear to auscultation and percussion with normal breathing effort HEART: regular rate & rhythm and no murmurs and no lower extremity edema ABDOMEN:abdomen soft, non-tender and normal bowel sounds Musculoskeletal:no cyanosis of digits and no clubbing  NEURO: alert & oriented x 3 with fluent speech, no focal motor/sensory deficits  LABORATORY DATA:  I have reviewed the data as listed    Component Value Date/Time   NA 142 11/10/2020 0825   NA 143 08/15/2016 1037   K 3.8 11/10/2020 0825   K 4.8 08/15/2016 1037   CL 107 11/10/2020 0825   CL 103 07/16/2012 1304   CO2 23 11/10/2020 0825   CO2  27 08/15/2016 1037   GLUCOSE 130 (H) 11/10/2020 0825   GLUCOSE 111 08/15/2016 1037   GLUCOSE 112 (H) 07/16/2012 1304   BUN 9 11/10/2020 0825   BUN 12.7 08/15/2016 1037   CREATININE 0.74 11/10/2020 0825   CREATININE 0.95 (H) 08/10/2020 1041   CREATININE 0.9 08/15/2016 1037   CALCIUM 8.3 (L) 11/10/2020 0825   CALCIUM 10.3 08/13/2020 1117   CALCIUM 10.5 (H) 08/15/2016 1037   PROT 6.4 (L) 11/10/2020 0825   PROT 7.9 08/15/2016 1037   ALBUMIN 3.9 11/10/2020 0825   ALBUMIN 4.5 08/15/2016 1037   AST 21 11/10/2020 0825   AST 30 08/15/2016 1037   ALT 15 11/10/2020 0825   ALT 29 08/15/2016 1037   ALKPHOS 81 11/10/2020 0825   ALKPHOS 67 08/15/2016 1037   BILITOT 0.3 11/10/2020 0825   BILITOT 0.80 08/15/2016 1037   GFRNONAA >60 11/10/2020 0825   GFRNONAA 62 12/05/2017 1000   GFRAA 72 12/05/2017 1000    No results found for: SPEP, UPEP  Lab Results  Component Value Date   WBC 15.5 (H) 11/10/2020   NEUTROABS 11.6 (H) 11/10/2020   HGB 10.9 (L) 11/10/2020   HCT 34.0 (L) 11/10/2020   MCV 90.4 11/10/2020   PLT 257 11/10/2020      Chemistry      Component Value Date/Time   NA 142 11/10/2020 0825   NA 143 08/15/2016 1037   K 3.8 11/10/2020 0825   K 4.8 08/15/2016 1037   CL 107 11/10/2020 0825   CL 103 07/16/2012 1304   CO2 23 11/10/2020 0825   CO2 27 08/15/2016 1037   BUN 9 11/10/2020 0825   BUN 12.7 08/15/2016 1037   CREATININE 0.74 11/10/2020 0825   CREATININE 0.95 (H) 08/10/2020 1041   CREATININE 0.9 08/15/2016 1037      Component Value Date/Time   CALCIUM 8.3 (L) 11/10/2020 0825   CALCIUM 10.3 08/13/2020 1117   CALCIUM 10.5 (H) 08/15/2016 1037   ALKPHOS 81 11/10/2020 0825   ALKPHOS 67 08/15/2016 1037   AST 21 11/10/2020 0825   AST 30 08/15/2016 1037   ALT 15 11/10/2020 0825   ALT 29 08/15/2016 1037   BILITOT 0.3 11/10/2020 0825   BILITOT 0.80 08/15/2016 1037

## 2020-11-10 NOTE — Assessment & Plan Note (Signed)
Her blood pressure is profoundly elevated but could be attributed to anxiety For now, observe closely

## 2020-11-10 NOTE — Patient Instructions (Signed)
[facility]  Discharge Instructions: Thank you for choosing [facility] to provide your oncology and hematology care.   If you have a lab appointment with the [facility], please go directly to the [facility] and check in at the registration area.   Wear comfortable clothing and clothing appropriate for easy access to any Portacath or PICC line.   We strive to give you quality time with your provider. You may need to reschedule your appointment if you arrive late (15 or more minutes).  Arriving late affects you and other patients whose appointments are after yours.  Also, if you miss three or more appointments without notifying the office, you may be dismissed from the clinic at the provider's discretion.      For prescription refill requests, have your pharmacy contact our office and allow 72 hours for refills to be completed.    Today you received the following chemotherapy and/or immunotherapy agents doxorubicin, vinblastine, dacarbazine, brentuximab    To help prevent nausea and vomiting after your treatment, we encourage you to take your nausea medication as directed.  BELOW ARE SYMPTOMS THAT SHOULD BE REPORTED IMMEDIATELY: . *FEVER GREATER THAN 100.4 F (38 C) OR HIGHER . *CHILLS OR SWEATING . *NAUSEA AND VOMITING THAT IS NOT CONTROLLED WITH YOUR NAUSEA MEDICATION . *UNUSUAL SHORTNESS OF BREATH . *UNUSUAL BRUISING OR BLEEDING . *URINARY PROBLEMS (pain or burning when urinating, or frequent urination) . *BOWEL PROBLEMS (unusual diarrhea, constipation, pain near the anus) . TENDERNESS IN MOUTH AND THROAT WITH OR WITHOUT PRESENCE OF ULCERS (sore throat, sores in mouth, or a toothache) . UNUSUAL RASH, SWELLING OR PAIN  . UNUSUAL VAGINAL DISCHARGE OR ITCHING   Items with * indicate a potential emergency and should be followed up as soon as possible or go to the Emergency Department if any problems should occur.  Please show the  CHEMOTHERAPY ALERT CARD or IMMUNOTHERAPY ALERT CARD at check-in to the Emergency Department and triage nurse.  Should you have questions after your visit or need to cancel or reschedule your appointment, please contact [facility]  Dept: [phone]  and follow the prompts.  Office hours are 8:00 a.m. to 4:30 p.m. Monday - Friday. Please note that voicemails left after 4:00 p.m. may not be returned until the following business day.  We are closed weekends and major holidays. You have access to a nurse at all times for urgent questions. Please call the main number to the clinic Dept: [phone] and follow the prompts.   For any non-urgent questions, you may also contact your provider using MyChart. We now offer e-Visits for anyone 46 and older to request care online for non-urgent symptoms. For details visit [email or web address].   Also download the MyChart app! Go to the app store, search "MyChart", open the app, select [facility], and log in with your MyChart username and password.  Due to Covid, a mask is required upon entering the hospital/clinic. If you do not have a mask, one will be given to you upon arrival. For doctor visits, patients may have 1 support person aged 59 or older with them. For treatment visits, patients cannot have anyone with them due to current Covid guidelines and our immunocompromised population.

## 2020-11-10 NOTE — Assessment & Plan Note (Signed)

## 2020-11-11 ENCOUNTER — Other Ambulatory Visit: Payer: Self-pay | Admitting: *Deleted

## 2020-11-11 MED ORDER — METOPROLOL SUCCINATE ER 50 MG PO TB24: ORAL_TABLET | ORAL | 2 refills | Status: DC

## 2020-11-12 ENCOUNTER — Inpatient Hospital Stay: Payer: Medicare PPO | Attending: Hematology and Oncology

## 2020-11-12 ENCOUNTER — Other Ambulatory Visit: Payer: Self-pay

## 2020-11-12 VITALS — BP 140/63 | HR 79 | Temp 98.4°F | Resp 18

## 2020-11-12 DIAGNOSIS — D6481 Anemia due to antineoplastic chemotherapy: Secondary | ICD-10-CM | POA: Diagnosis not present

## 2020-11-12 DIAGNOSIS — Z5111 Encounter for antineoplastic chemotherapy: Secondary | ICD-10-CM | POA: Diagnosis present

## 2020-11-12 DIAGNOSIS — C8118 Nodular sclerosis classical Hodgkin lymphoma, lymph nodes of multiple sites: Secondary | ICD-10-CM

## 2020-11-12 DIAGNOSIS — G620 Drug-induced polyneuropathy: Secondary | ICD-10-CM | POA: Insufficient documentation

## 2020-11-12 DIAGNOSIS — Z5189 Encounter for other specified aftercare: Secondary | ICD-10-CM | POA: Insufficient documentation

## 2020-11-12 DIAGNOSIS — T451X5A Adverse effect of antineoplastic and immunosuppressive drugs, initial encounter: Secondary | ICD-10-CM | POA: Diagnosis not present

## 2020-11-12 DIAGNOSIS — C8198 Hodgkin lymphoma, unspecified, lymph nodes of multiple sites: Secondary | ICD-10-CM | POA: Insufficient documentation

## 2020-11-12 MED ORDER — PEGFILGRASTIM-JMDB 6 MG/0.6ML ~~LOC~~ SOSY
6.0000 mg | PREFILLED_SYRINGE | Freq: Once | SUBCUTANEOUS | Status: AC
Start: 2020-11-12 — End: 2020-11-12
  Administered 2020-11-12: 6 mg via SUBCUTANEOUS

## 2020-11-12 MED ORDER — PEGFILGRASTIM-JMDB 6 MG/0.6ML ~~LOC~~ SOSY
PREFILLED_SYRINGE | SUBCUTANEOUS | Status: AC
  Filled 2020-11-12: qty 0.6

## 2020-11-12 NOTE — Patient Instructions (Signed)
Pegfilgrastim injection What is this medicine? PEGFILGRASTIM (PEG fil gra stim) is a long-acting granulocyte colony-stimulating factor that stimulates the growth of neutrophils, a type of white blood cell important in the body's fight against infection. It is used to reduce the incidence of fever and infection in patients with certain types of cancer who are receiving chemotherapy that affects the bone marrow, and to increase survival after being exposed to high doses of radiation. This medicine may be used for other purposes; ask your health care provider or pharmacist if you have questions. COMMON BRAND NAME(S): Fulphila, Neulasta, Nyvepria, UDENYCA, Ziextenzo What should I tell my health care provider before I take this medicine? They need to know if you have any of these conditions:  kidney disease  latex allergy  ongoing radiation therapy  sickle cell disease  skin reactions to acrylic adhesives (On-Body Injector only)  an unusual or allergic reaction to pegfilgrastim, filgrastim, other medicines, foods, dyes, or preservatives  pregnant or trying to get pregnant  breast-feeding How should I use this medicine? This medicine is for injection under the skin. If you get this medicine at home, you will be taught how to prepare and give the pre-filled syringe or how to use the On-body Injector. Refer to the patient Instructions for Use for detailed instructions. Use exactly as directed. Tell your healthcare provider immediately if you suspect that the On-body Injector may not have performed as intended or if you suspect the use of the On-body Injector resulted in a missed or partial dose. It is important that you put your used needles and syringes in a special sharps container. Do not put them in a trash can. If you do not have a sharps container, call your pharmacist or healthcare provider to get one. Talk to your pediatrician regarding the use of this medicine in children. While this drug  may be prescribed for selected conditions, precautions do apply. Overdosage: If you think you have taken too much of this medicine contact a poison control center or emergency room at once. NOTE: This medicine is only for you. Do not share this medicine with others. What if I miss a dose? It is important not to miss your dose. Call your doctor or health care professional if you miss your dose. If you miss a dose due to an On-body Injector failure or leakage, a new dose should be administered as soon as possible using a single prefilled syringe for manual use. What may interact with this medicine? Interactions have not been studied. This list may not describe all possible interactions. Give your health care provider a list of all the medicines, herbs, non-prescription drugs, or dietary supplements you use. Also tell them if you smoke, drink alcohol, or use illegal drugs. Some items may interact with your medicine. What should I watch for while using this medicine? Your condition will be monitored carefully while you are receiving this medicine. You may need blood work done while you are taking this medicine. Talk to your health care provider about your risk of cancer. You may be more at risk for certain types of cancer if you take this medicine. If you are going to need a MRI, CT scan, or other procedure, tell your doctor that you are using this medicine (On-Body Injector only). What side effects may I notice from receiving this medicine? Side effects that you should report to your doctor or health care professional as soon as possible:  allergic reactions (skin rash, itching or hives, swelling of   the face, lips, or tongue)  back pain  dizziness  fever  pain, redness, or irritation at site where injected  pinpoint red spots on the skin  red or dark-brown urine  shortness of breath or breathing problems  stomach or side pain, or pain at the shoulder  swelling  tiredness  trouble  passing urine or change in the amount of urine  unusual bruising or bleeding Side effects that usually do not require medical attention (report to your doctor or health care professional if they continue or are bothersome):  bone pain  muscle pain This list may not describe all possible side effects. Call your doctor for medical advice about side effects. You may report side effects to FDA at 1-800-FDA-1088. Where should I keep my medicine? Keep out of the reach of children. If you are using this medicine at home, you will be instructed on how to store it. Throw away any unused medicine after the expiration date on the label. NOTE: This sheet is a summary. It may not cover all possible information. If you have questions about this medicine, talk to your doctor, pharmacist, or health care provider.  2021 Elsevier/Gold Standard (2019-06-21 13:20:51)  

## 2020-11-23 ENCOUNTER — Ambulatory Visit (HOSPITAL_COMMUNITY)
Admission: RE | Admit: 2020-11-23 | Discharge: 2020-11-23 | Disposition: A | Discharge status: Home / Self Care | Payer: Medicare PPO | Source: Ambulatory Visit | Attending: Hematology and Oncology | Admitting: Hematology and Oncology

## 2020-11-23 ENCOUNTER — Other Ambulatory Visit: Payer: Self-pay

## 2020-11-23 DIAGNOSIS — C8118 Nodular sclerosis classical Hodgkin lymphoma, lymph nodes of multiple sites: Secondary | ICD-10-CM | POA: Insufficient documentation

## 2020-11-23 LAB — GLUCOSE, CAPILLARY: Glucose-Capillary: 131 mg/dL — ABNORMAL HIGH (ref 70–99)

## 2020-11-23 MED ORDER — FLUDEOXYGLUCOSE F - 18 (FDG) INJECTION
7.4000 | Freq: Once | INTRAVENOUS | Status: AC | PRN
  Administered 2020-11-23: 7 via INTRAVENOUS

## 2020-11-24 ENCOUNTER — Inpatient Hospital Stay: Payer: Medicare PPO

## 2020-11-24 ENCOUNTER — Inpatient Hospital Stay (HOSPITAL_BASED_OUTPATIENT_CLINIC_OR_DEPARTMENT_OTHER): Payer: Medicare PPO | Admitting: Hematology and Oncology

## 2020-11-24 ENCOUNTER — Other Ambulatory Visit: Payer: Self-pay | Admitting: Pharmacist

## 2020-11-24 ENCOUNTER — Encounter: Payer: Self-pay | Admitting: Hematology and Oncology

## 2020-11-24 DIAGNOSIS — T451X5A Adverse effect of antineoplastic and immunosuppressive drugs, initial encounter: Secondary | ICD-10-CM | POA: Diagnosis not present

## 2020-11-24 DIAGNOSIS — C8118 Nodular sclerosis classical Hodgkin lymphoma, lymph nodes of multiple sites: Secondary | ICD-10-CM

## 2020-11-24 DIAGNOSIS — D6481 Anemia due to antineoplastic chemotherapy: Secondary | ICD-10-CM

## 2020-11-24 DIAGNOSIS — Z5111 Encounter for antineoplastic chemotherapy: Secondary | ICD-10-CM | POA: Diagnosis not present

## 2020-11-24 DIAGNOSIS — G609 Hereditary and idiopathic neuropathy, unspecified: Secondary | ICD-10-CM | POA: Diagnosis not present

## 2020-11-24 LAB — CBC WITH DIFFERENTIAL (CANCER CENTER ONLY)
Abs Immature Granulocytes: 0.66 10*3/uL — ABNORMAL HIGH (ref 0.00–0.07)
Basophils Absolute: 0.1 10*3/uL (ref 0.0–0.1)
Basophils Relative: 1 %
Eosinophils Absolute: 0.2 10*3/uL (ref 0.0–0.5)
Eosinophils Relative: 1 %
HCT: 30.7 % — ABNORMAL LOW (ref 36.0–46.0)
Hemoglobin: 10.3 g/dL — ABNORMAL LOW (ref 12.0–15.0)
Immature Granulocytes: 5 %
Lymphocytes Relative: 9 %
Lymphs Abs: 1.2 10*3/uL (ref 0.7–4.0)
MCH: 30.2 pg (ref 26.0–34.0)
MCHC: 33.6 g/dL (ref 30.0–36.0)
MCV: 90 fL (ref 80.0–100.0)
Monocytes Absolute: 1.2 10*3/uL — ABNORMAL HIGH (ref 0.1–1.0)
Monocytes Relative: 8 %
Neutro Abs: 10.3 10*3/uL — ABNORMAL HIGH (ref 1.7–7.7)
Neutrophils Relative %: 76 %
Platelet Count: 213 10*3/uL (ref 150–400)
RBC: 3.41 MIL/uL — ABNORMAL LOW (ref 3.87–5.11)
RDW: 20.7 % — ABNORMAL HIGH (ref 11.5–15.5)
WBC Count: 13.6 10*3/uL — ABNORMAL HIGH (ref 4.0–10.5)
nRBC: 0 % (ref 0.0–0.2)

## 2020-11-24 LAB — CMP (CANCER CENTER ONLY)
ALT: 13 U/L (ref 0–44)
AST: 19 U/L (ref 15–41)
Albumin: 3.9 g/dL (ref 3.5–5.0)
Alkaline Phosphatase: 74 U/L (ref 38–126)
Anion gap: 10 (ref 5–15)
BUN: 15 mg/dL (ref 8–23)
CO2: 26 mmol/L (ref 22–32)
Calcium: 9.3 mg/dL (ref 8.9–10.3)
Chloride: 104 mmol/L (ref 98–111)
Creatinine: 0.78 mg/dL (ref 0.44–1.00)
GFR, Estimated: >60 mL/min (ref >60)
Glucose, Bld: 143 mg/dL — ABNORMAL HIGH (ref 70–99)
Potassium: 3.7 mmol/L (ref 3.5–5.1)
Sodium: 140 mmol/L (ref 135–145)
Total Bilirubin: 0.3 mg/dL (ref 0.3–1.2)
Total Protein: 6.4 g/dL — ABNORMAL LOW (ref 6.5–8.1)

## 2020-11-24 MED ORDER — SODIUM CHLORIDE 0.9% FLUSH
10.0000 mL | INTRAVENOUS | Status: DC | PRN
  Administered 2020-11-24: 10 mL
  Filled 2020-11-24: qty 10

## 2020-11-24 MED ORDER — ACETAMINOPHEN 325 MG PO TABS
650.0000 mg | ORAL_TABLET | Freq: Once | ORAL | Status: AC
  Administered 2020-11-24: 650 mg via ORAL

## 2020-11-24 MED ORDER — DIPHENHYDRAMINE HCL 50 MG/ML IJ SOLN: 12.5000 mg | Freq: Once | INTRAMUSCULAR | Status: DC

## 2020-11-24 MED ORDER — PALONOSETRON HCL INJECTION 0.25 MG/5ML
INTRAVENOUS | Status: AC
  Filled 2020-11-24: qty 5

## 2020-11-24 MED ORDER — ACETAMINOPHEN 325 MG PO TABS
ORAL_TABLET | ORAL | Status: AC
  Filled 2020-11-24: qty 2

## 2020-11-24 MED ORDER — SODIUM CHLORIDE 0.9 % IV SOLN
3.0000 mg/m2 | Freq: Once | INTRAVENOUS | Status: AC
  Administered 2020-11-24: 5.1 mg via INTRAVENOUS
  Filled 2020-11-24: qty 5.1

## 2020-11-24 MED ORDER — SODIUM CHLORIDE 0.9% FLUSH
10.0000 mL | Freq: Once | INTRAVENOUS | Status: AC
  Administered 2020-11-24: 10 mL
  Filled 2020-11-24: qty 10

## 2020-11-24 MED ORDER — SODIUM CHLORIDE 0.9 % IV SOLN
375.0000 mg/m2 | Freq: Once | INTRAVENOUS | Status: AC
  Administered 2020-11-24: 630 mg via INTRAVENOUS
  Filled 2020-11-24: qty 63

## 2020-11-24 MED ORDER — GABAPENTIN 300 MG PO CAPS: 300.0000 mg | ORAL_CAPSULE | Freq: Two times a day (BID) | ORAL | 1 refills | Status: DC

## 2020-11-24 MED ORDER — HEPARIN SOD (PORK) LOCK FLUSH 100 UNIT/ML IV SOLN
500.0000 [IU] | Freq: Once | INTRAVENOUS | Status: AC | PRN
  Administered 2020-11-24: 500 [IU]
  Filled 2020-11-24: qty 5

## 2020-11-24 MED ORDER — DIPHENHYDRAMINE HCL 50 MG/ML IJ SOLN
INTRAMUSCULAR | Status: AC
  Filled 2020-11-24: qty 1

## 2020-11-24 MED ORDER — DEXAMETHASONE SODIUM PHOSPHATE 10 MG/ML IJ SOLN
INTRAMUSCULAR | Status: AC
  Filled 2020-11-24: qty 1

## 2020-11-24 MED ORDER — DOXORUBICIN HCL CHEMO IV INJECTION 2 MG/ML
25.0000 mg/m2 | Freq: Once | INTRAVENOUS | Status: AC
  Administered 2020-11-24: 42 mg via INTRAVENOUS
  Filled 2020-11-24: qty 21

## 2020-11-24 MED ORDER — SODIUM CHLORIDE 0.9 % IV SOLN
150.0000 mg | Freq: Once | INTRAVENOUS | Status: AC
  Administered 2020-11-24: 150 mg via INTRAVENOUS
  Filled 2020-11-24: qty 150

## 2020-11-24 MED ORDER — DEXAMETHASONE SODIUM PHOSPHATE 10 MG/ML IJ SOLN
5.0000 mg | Freq: Once | INTRAMUSCULAR | Status: AC
  Administered 2020-11-24: 5 mg via INTRAVENOUS

## 2020-11-24 MED ORDER — PALONOSETRON HCL INJECTION 0.25 MG/5ML
0.2500 mg | Freq: Once | INTRAVENOUS | Status: AC
  Administered 2020-11-24: 0.25 mg via INTRAVENOUS

## 2020-11-24 MED ORDER — SODIUM CHLORIDE 0.9 % IV SOLN
Freq: Once | INTRAVENOUS | Status: AC
Start: 2020-11-24 — End: 2020-11-24
  Filled 2020-11-24: qty 250

## 2020-11-24 NOTE — Patient Instructions (Signed)
[facility] ONCOLOGY  Discharge Instructions: Thank you for choosing [facility] to provide your oncology and hematology care.   If you have a lab appointment with the [facility], please go directly to the [facility] and check in at the registration area.   Wear comfortable clothing and clothing appropriate for easy access to any Portacath or PICC line.   We strive to give you quality time with your provider. You may need to reschedule your appointment if you arrive late (15 or more minutes).  Arriving late affects you and other patients whose appointments are after yours.  Also, if you miss three or more appointments without notifying the office, you may be dismissed from the clinic at the provider's discretion.      For prescription refill requests, have your pharmacy contact our office and allow 72 hours for refills to be completed.    Today you received the following chemotherapy and/or immunotherapy agents : Adriamycin, Vinblastin, DTIC     To help prevent nausea and vomiting after your treatment, we encourage you to take your nausea medication as directed.  BELOW ARE SYMPTOMS THAT SHOULD BE REPORTED IMMEDIATELY: *FEVER GREATER THAN 100.4 F (38 C) OR HIGHER *CHILLS OR SWEATING *NAUSEA AND VOMITING THAT IS NOT CONTROLLED WITH YOUR NAUSEA MEDICATION *UNUSUAL SHORTNESS OF BREATH *UNUSUAL BRUISING OR BLEEDING *URINARY PROBLEMS (pain or burning when urinating, or frequent urination) *BOWEL PROBLEMS (unusual diarrhea, constipation, pain near the anus) TENDERNESS IN MOUTH AND THROAT WITH OR WITHOUT PRESENCE OF ULCERS (sore throat, sores in mouth, or a toothache) UNUSUAL RASH, SWELLING OR PAIN  UNUSUAL VAGINAL DISCHARGE OR ITCHING   Items with * indicate a potential emergency and should be followed up as soon as possible or go to the Emergency Department if any problems should occur.  Please show the CHEMOTHERAPY ALERT CARD or IMMUNOTHERAPY ALERT  CARD at check-in to the Emergency Department and triage nurse.  Should you have questions after your visit or need to cancel or reschedule your appointment, please contact [facility]  Dept: [phone]  and follow the prompts.  Office hours are 8:00 a.m. to 4:30 p.m. Monday - Friday. Please note that voicemails left after 4:00 p.m. may not be returned until the following business day.  We are closed weekends and major holidays. You have access to a nurse at all times for urgent questions. Please call the main number to the clinic Dept: [phone] and follow the prompts.   For any non-urgent questions, you may also contact your provider using MyChart. We now offer e-Visits for anyone 74 and older to request care online for non-urgent symptoms. For details visit [email or web address].   Also download the MyChart app! Go to the app store, search "MyChart", open the app, select [facility], and log in with your MyChart username and password.  Due to Covid, a mask is required upon entering the hospital/clinic. If you do not have a mask, one will be given to you upon arrival. For doctor visits, patients may have 1 support person aged 74 or older with them. For treatment visits, patients cannot have anyone with them due to current Covid guidelines and our immunocompromised population.

## 2020-11-24 NOTE — Assessment & Plan Note (Signed)
I have reviewed multiple imaging studies with the patient and family She has excellent response to treatment However, her treatment is complicated by peripheral neuropathy We discussed the risk and benefits of discontinuing Brentuximab versus dose reduction Ultimately, we are in agreement to discontinue brentuximab vedotin I recommend finishing total of 6 cycles of Adriamycin, vinblastine and dacarbazine treatment before repeat PET CT scan again She will be due for echocardiogram again next month

## 2020-11-24 NOTE — Assessment & Plan Note (Signed)

## 2020-11-24 NOTE — Progress Notes (Signed)
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 [facility]) I have reviewed multiple imaging studies with the patient and family She has excellent response to treatment However, her treatment is complicated by peripheral neuropathy We discussed the risk and benefits of discontinuing Brentuximab versus dose reduction Ultimately, we are in agreement to discontinue brentuximab vedotin I recommend finishing total of 6 cycles of Adriamycin, vinblastine and dacarbazine treatment before repeat PET CT scan again She will be due for echocardiogram again next month  Peripheral neuropathy She has significant peripheral neuropathy We discussed the risk and benefits of discontinuation of brentuximab vedotin she is in agreement I recommend a trial of gabapentin We discussed expected side effects from gabapentin and she is willing to try  Anemia due to antineoplastic chemotherapy This is likely due to recent treatment. The patient denies recent history of bleeding such as epistaxis, hematuria or hematochezia. She is asymptomatic from the anemia. I will observe for now.  She does not require transfusion now. I will continue the chemotherapy at current dose without dosage adjustment.  If the anemia gets progressive worse in the future, I might have to delay her treatment or adjust the chemotherapy dose.   No orders of the defined types were placed in this encounter.   All questions were answered. The patient knows to call the clinic with any problems, questions or concerns. The total time spent in the appointment was 40 minutes encounter with patients including review of chart and various tests results, discussions about plan of care and  coordination of care plan   Heath Lark, MD 11/24/2020 3:11 PM  INTERVAL HISTORY: Please see below for problem oriented charting. 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 Extensive hypermetabolic lymphadenopathy involving the neck, chest and abdomen as detailed above (Deauville 5). I do not see any pelvic disease. 2. Possible involvement of the spleen. 3. Osseous involvement is also noted.     09/03/2020 Pathology Results   A. LYMPH NODE, LEFT NECK, EXCISION:  -Lymphoproliferative disorder consistent with classical Hodgkin lymphoma -See comment   COMMENT:   The sections show effacement of the lymph nodal architecture by a primarily nodular lymphoproliferative process.  In the more cellular areas, the nodules are composed of a mixture of small lymphocytes, plasma cells, abundant histiocytes in addition to atypical large mononuclear and multi-lobated lymphoid appearing cells with variably prominent nucleoli.  The nodules are surrounded by dense collagenous fibrosis.  In the less cellular areas, there appears to be a fibroblastic proliferation and a much more depleted cellular appearance but scattering of large atypical lymphoid appearing cells is  still seen.   Flow cytometric analysis was  performed [facility] [phone]) and shows predominance of T lymphocytes with nonspecific changes including relative abundance of CD8 positive cells and reversal of the CD4: CD8 ratio.  No monoclonal B-cell population identified.  A battery of immunohistochemical stains was performed and shows that the large atypical lymphoid appearing cells are positive for CD30, Mum-1, PAX 5 (weak), CD23 in addition to variable but generally weak positivity for CD79a and CD20.  Only scattered large atypical lymphoid cells show weak Golgi positivity for CD15.  The large atypical lymphoid cells are negative for LCA, CD10, CD3, CD5, EMA, ALK protein and EBV in situ  hybridization.  The small lymphoid cells in the background show a mixture of T and B cells with predominance of T cells.  T cells show a mixture of CD4 and CD8 positive cells with slight predominance of the latter.  The overall findings are most consistent with classical Hodgkin lymphoma, nodular sclerosis subtype.      09/21/2020 Procedure   Placement of a subcutaneous port device. Catheter tip at the superior cavoatrial junction.   09/25/2020 Echocardiogram   1. Left ventricular ejection fraction, by estimation, is 60 to 65%. The left ventricle has normal function. The left ventricle has no regional wall motion abnormalities. There is mild concentric left ventricular hypertrophy. Left ventricular diastolic parameters are consistent with Grade I diastolic dysfunction (impaired relaxation). The average left ventricular global longitudinal strain is -19.6 %. The global longitudinal strain is normal.  2. Right ventricular systolic function is normal. The right ventricular size is normal.  3. A small pericardial effusion is present. The pericardial effusion is surrounding the apex and anterior to the right ventricle. There is no evidence of cardiac tamponade.  4. The mitral valve is normal in structure. No evidence of mitral valve regurgitation.  5. The aortic valve is  tricuspid. Aortic valve regurgitation is not visualized. Mild aortic valve sclerosis is present, with no evidence of aortic valve stenosis.  6. The inferior vena cava is normal in size with greater than 50% respiratory variability, suggesting right atrial pressure of 3 mmHg.    Chemotherapy    Patient is on Treatment Plan: HODGKINS LYMPHOMA  A + AVD Q28D       11/23/2020 PET scan   1. Interval significant response to therapy. The previously demonstrated hypermetabolic adenopathy in the neck, chest and abdomen has nearly completely resolved. Deauville 1 and 2. Slightly greater residual metabolic activity within hilar lymph nodes is less specific and may be reactive. 2. Resolution of previously demonstrated focal hypermetabolic activity in the spleen and bones. Diffuse bone marrow activity attributed to treatment response. 3. Stable incidental findings as detailed above.   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[place])  09/14/2020 Initial Diagnosis   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[place])    09/29/2020 -  Chemotherapy    Patient is on Treatment Plan: HODGKINS LYMPHOMA  A + AVD Q28D         REVIEW OF SYSTEMS:   Constitutional: Denies fevers, chills or abnormal weight loss Eyes: Denies blurriness of vision Ears, nose, mouth, throat, and face: Denies mucositis or sore throat Respiratory: Denies cough, dyspnea or wheezes Cardiovascular: Denies palpitation, chest discomfort or lower extremity swelling Gastrointestinal:  Denies nausea, heartburn or change in bowel habits Skin: Denies abnormal skin rashes Lymphatics: Denies new lymphadenopathy or easy bruising Neurological:Denies numbness, tingling or new weaknesses Behavioral/Psych: Mood is stable, no new changes  All other systems were reviewed with the patient and  are negative.  I have reviewed the past medical history, past surgical history, social history and family history with the patient and they are unchanged from previous  note.  ALLERGIES:  is allergic to codeine.  MEDICATIONS:  Current Outpatient Medications  Medication Sig Dispense Refill   gabapentin (NEURONTIN) 300 MG capsule Take 1 capsule (300 mg total) by mouth 2 (two) times daily. 60 capsule 1   acyclovir (ZOVIRAX) 400 MG tablet Take 1 tablet (400 mg total) by mouth 2 (two) times daily. 60 tablet 6   albuterol (VENTOLIN HFA) 108 (90 Base) MCG/ACT inhaler Inhale 2 puffs into the lungs every 6 (six) hours as needed for wheezing or shortness of breath. 8 g 0   aspirin EC 81 MG tablet Take 81 mg by mouth daily.     atorvastatin (LIPITOR) 20 MG tablet TAKE 1 TABLET BY MOUTH ONCE DAILY 90 tablet 3   BREO ELLIPTA 100-25 MCG/INH AEPB INHALE 1 PUFF INTO THE LUNGS DAILY 60 each 5   cetirizine (ZYRTEC) 10 MG tablet Take 1 tablet (10 mg total) by mouth daily. 90 tablet 2   denosumab (PROLIA) 60 MG/ML SOSY injection Inject 60 mg into the skin every 6 (six) months.     glucose blood (ACCU-CHEK AVIVA PLUS) test strip Use to Monitor blood sugars daily 100 each 5   hydrochlorothiazide (MICROZIDE) 12.5 MG capsule Take 1 capsule (12.5 mg total) by mouth daily. 90 capsule 3   lidocaine-prilocaine (EMLA) cream Apply to affected area once 30 g 3   losartan (COZAAR) 100 MG tablet Take 1 tablet (100 mg total) by mouth daily. 90 tablet 2   metoprolol succinate (TOPROL-XL) 50 MG 24 hr tablet Take with or immediately following a meal. 90 tablet 2   Multiple Vitamin (MULTIVITAMIN) capsule Take 1 capsule by mouth daily.     omeprazole (PRILOSEC) 20 MG capsule TAKE 1 CAPSULE BY MOUTH ONCE DAILY 90 capsule 3   ondansetron (ZOFRAN) 8 MG tablet Take 1 tablet (8 mg total) by mouth every 8 (eight) hours as needed. 30 tablet 1   prochlorperazine (COMPAZINE) 10 MG tablet Take 1 tablet (10 mg total) by mouth every 6 (six) hours as needed (Nausea or vomiting). 30 tablet 1   Current Facility-Administered Medications  Medication Dose Route Frequency Provider Last Rate Last Admin    denosumab (PROLIA) injection 60 mg  60 mg Subcutaneous Q6 months , Kawanta F, MD   60 mg at 08/10/20 1000   Facility-Administered Medications Ordered in Other Visits  Medication Dose Route Frequency Provider Last Rate Last Admin   sodium chloride flush (NS) 0.9 % injection 10 mL  10 mL Intracatheter PRN Heath Lark, MD   10 mL at 11/24/20 1257    PHYSICAL EXAMINATION: ECOG PERFORMANCE STATUS: 1 - Symptomatic but completely ambulatory  Vitals:   11/24/20 0922  BP: (!) 148/67  Pulse: 96  Resp: 18  Temp: 98 F (36.7 C)  SpO2: 100%   Filed Weights   11/24/20 0922  Weight: 140 lb 9.6 oz (63.8 kg)    GENERAL:alert, no distress and comfortable SKIN: skin color, texture, turgor are normal, no rashes or significant lesions EYES: normal, Conjunctiva are pink and non-injected, sclera clear OROPHARYNX:no exudate, no erythema and lips, buccal mucosa, and tongue normal  NECK: supple, thyroid normal size, non-tender, without nodularity LYMPH:  no palpable lymphadenopathy in the cervical, axillary or inguinal LUNGS: clear to auscultation and percussion with normal breathing effort HEART: regular rate & rhythm and no murmurs and no  lower extremity edema ABDOMEN:abdomen soft, non-tender and normal bowel sounds Musculoskeletal:no cyanosis of digits and no clubbing  NEURO: alert & oriented x 3 with fluent speech, no focal motor/sensory deficits  LABORATORY DATA:  I have reviewed the data as listed    Component Value Date/Time   NA 140 11/24/2020 0901   NA 143 08/15/2016 1037   K 3.7 11/24/2020 0901   K 4.8 08/15/2016 1037   CL 104 11/24/2020 0901   CL 103 07/16/2012 1304   CO2 26 11/24/2020 0901   CO2 27 08/15/2016 1037   GLUCOSE 143 (H) 11/24/2020 0901   GLUCOSE 111 08/15/2016 1037   GLUCOSE 112 (H) 07/16/2012 1304   BUN 15 11/24/2020 0901   BUN 12.7 08/15/2016 1037   CREATININE 0.78 11/24/2020 0901   CREATININE 0.95 (H) 08/10/2020 1041   CREATININE 0.9 08/15/2016 1037    CALCIUM 9.3 11/24/2020 0901   CALCIUM 10.3 08/13/2020 1117   CALCIUM 10.5 (H) 08/15/2016 1037   PROT 6.4 (L) 11/24/2020 0901   PROT 7.9 08/15/2016 1037   ALBUMIN 3.9 11/24/2020 0901   ALBUMIN 4.5 08/15/2016 1037   AST 19 11/24/2020 0901   AST 30 08/15/2016 1037   ALT 13 11/24/2020 0901   ALT 29 08/15/2016 1037   ALKPHOS 74 11/24/2020 0901   ALKPHOS 67 08/15/2016 1037   BILITOT 0.3 11/24/2020 0901   BILITOT 0.80 08/15/2016 1037   GFRNONAA >60 11/24/2020 0901   GFRNONAA 62 12/05/2017 1000   GFRAA 72 12/05/2017 1000    No results found for: SPEP, UPEP  Lab Results  Component Value Date   WBC 13.6 (H) 11/24/2020   NEUTROABS 10.3 (H) 11/24/2020   HGB 10.3 (L) 11/24/2020   HCT 30.7 (L) 11/24/2020   MCV 90.0 11/24/2020   PLT 213 11/24/2020      Chemistry      Component Value Date/Time   NA 140 11/24/2020 0901   NA 143 08/15/2016 1037   K 3.7 11/24/2020 0901   K 4.8 08/15/2016 1037   CL 104 11/24/2020 0901   CL 103 07/16/2012 1304   CO2 26 11/24/2020 0901   CO2 27 08/15/2016 1037   BUN 15 11/24/2020 0901   BUN 12.7 08/15/2016 1037   CREATININE 0.78 11/24/2020 0901   CREATININE 0.95 (H) 08/10/2020 1041   CREATININE 0.9 08/15/2016 1037      Component Value Date/Time   CALCIUM 9.3 11/24/2020 0901   CALCIUM 10.3 08/13/2020 1117   CALCIUM 10.5 (H) 08/15/2016 1037   ALKPHOS 74 11/24/2020 0901   ALKPHOS 67 08/15/2016 1037   AST 19 11/24/2020 0901   AST 30 08/15/2016 1037   ALT 13 11/24/2020 0901   ALT 29 08/15/2016 1037   BILITOT 0.3 11/24/2020 0901   BILITOT 0.80 08/15/2016 1037       RADIOGRAPHIC STUDIES: I have personally reviewed the radiological images as listed and agreed with the findings in the report. NM PET Image Restage (PS) Skull Base to Thigh  Result Date: 11/23/2020 CLINICAL DATA:  Subsequent treatment strategy for nodular sclerosing Hodgkin's disease. EXAM: NUCLEAR MEDICINE PET SKULL BASE TO THIGH TECHNIQUE: 7.0 mCi F-18 FDG was injected  intravenously. Full-ring PET imaging was performed from the skull base to thigh after the radiotracer. CT data was obtained and used for attenuation correction and anatomic localization. Fasting blood glucose: 131 mg/dl COMPARISON:  PET-CT 11/23/2020 FINDINGS: Mediastinal blood pool activity: SUV max 2.2 Liver activity: SUV max 4.0 NECK: Interval resolution of previously demonstrated hypermetabolic and mildly enlarged  cervical lymph nodes.There are no lesions of the pharyngeal mucosal space. Incidental CT findings: Bilateral carotid atherosclerosis. CHEST: There are small residual hypermetabolic hilar lymph nodes bilaterally (SUV max of 4.2 on the left and 4.6 on the right). There are no residual enlarged mediastinal or axillary lymph nodes. There are small residual left axillary and distal esophageal lymph nodes which demonstrate no abnormal metabolic activity. No hypermetabolic pulmonary activity or suspicious pulmonary nodularity. Incidental CT findings: Mild emphysema and biapical scarring. Right IJ Port-A-Cath extends to the superior cavoatrial junction. Atherosclerosis of the aorta, great vessels and coronary arteries with probable aortic valvular calcifications. ABDOMEN/PELVIS: There is no hypermetabolic activity within the liver, adrenal glands, spleen or pancreas. Interval near complete resolution of previously demonstrated extensive hypermetabolic adenopathy involving the gastrohepatic ligament and upper retroperitoneum. The largest remaining lymph node is a left periaortic node measuring 1.2 cm on image 113/4. This has an SUV max of 1.8. Previously, this measured 3.1 cm and had an SUV max of 10.2. Incidental CT findings: Prior cholecystectomy. Stable 1.4 cm hyperdense lesion in the upper pole of the left kidney without hypermetabolic activity. There is a nonobstructing small right renal calculus. Aortic and branch vessel atherosclerosis, distal colonic diverticulosis and a 4.4 cm cystic appearing lesion  along the left pelvic sidewall are unchanged. This cystic pelvic lesion is not associated with any abnormal metabolic activity. SKELETON: Diffuse marrow activity attributed to treatment response. There is no focal hypermetabolic activity to suggest residual marrow involvement. Incidental CT findings: Chronic superior endplate compression deformity at L1. 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 IMPRESSION: 1. Interval significant response to therapy. The previously demonstrated hypermetabolic adenopathy in the neck, chest and abdomen has nearly completely resolved. Deauville 1 and 2. Slightly greater residual metabolic activity within hilar lymph nodes is less specific and may be reactive. 2. Resolution of previously demonstrated focal hypermetabolic activity in the spleen and bones. Diffuse bone marrow activity attributed to treatment response. 3. Stable incidental findings as detailed above. Electronically Signed   By: Richardean Sale M.D.   On: 11/23/2020 13:06

## 2020-11-24 NOTE — Assessment & Plan Note (Signed)
She has significant peripheral neuropathy We discussed the risk and benefits of discontinuation of brentuximab vedotin she is in agreement I recommend a trial of gabapentin We discussed expected side effects from gabapentin and she is willing to try

## 2020-11-25 ENCOUNTER — Telehealth: Payer: Self-pay | Admitting: Hematology and Oncology

## 2020-11-25 NOTE — Telephone Encounter (Signed)
Scheduled per 6/14 sch msg. Pt will receive an updated appt calendar per next visit appt notes

## 2020-11-26 ENCOUNTER — Other Ambulatory Visit: Payer: Self-pay

## 2020-11-26 ENCOUNTER — Inpatient Hospital Stay: Payer: Medicare PPO

## 2020-11-26 VITALS — BP 130/59 | HR 70 | Resp 20

## 2020-11-26 DIAGNOSIS — C8118 Nodular sclerosis classical Hodgkin lymphoma, lymph nodes of multiple sites: Secondary | ICD-10-CM

## 2020-11-26 DIAGNOSIS — Z5111 Encounter for antineoplastic chemotherapy: Secondary | ICD-10-CM | POA: Diagnosis not present

## 2020-11-26 MED ORDER — PEGFILGRASTIM-JMDB 6 MG/0.6ML ~~LOC~~ SOSY
PREFILLED_SYRINGE | SUBCUTANEOUS | Status: AC
  Filled 2020-11-26: qty 0.6

## 2020-11-26 MED ORDER — PEGFILGRASTIM-JMDB 6 MG/0.6ML ~~LOC~~ SOSY
6.0000 mg | PREFILLED_SYRINGE | Freq: Once | SUBCUTANEOUS | Status: AC
  Administered 2020-11-26: 6 mg via SUBCUTANEOUS

## 2020-11-26 NOTE — Patient Instructions (Signed)

## 2020-11-30 ENCOUNTER — Telehealth: Payer: Self-pay

## 2020-11-30 NOTE — Telephone Encounter (Signed)
-----   Message from Heath Lark, MD sent at 11/30/2020  8:29 AM EDT ----- Can you call and ask how is her neuropathy doing? Did she tolerate gabapentin ok?
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
## 2020-11-30 NOTE — Telephone Encounter (Signed)
Called and left below message. Ask her to call the office back. 

## 2020-12-03 ENCOUNTER — Ambulatory Visit: Payer: Medicare PPO | Admitting: Nurse Practitioner

## 2020-12-04 ENCOUNTER — Other Ambulatory Visit: Payer: Self-pay

## 2020-12-04 ENCOUNTER — Ambulatory Visit: Payer: Medicare PPO | Admitting: Nurse Practitioner

## 2020-12-04 ENCOUNTER — Encounter: Payer: Self-pay | Admitting: Nurse Practitioner

## 2020-12-04 VITALS — BP 120/80 | HR 96 | Temp 97.8°F | Ht 63.0 in | Wt 136.0 lb

## 2020-12-04 DIAGNOSIS — I10 Essential (primary) hypertension: Secondary | ICD-10-CM

## 2020-12-04 DIAGNOSIS — E7800 Pure hypercholesterolemia, unspecified: Secondary | ICD-10-CM | POA: Diagnosis not present

## 2020-12-04 DIAGNOSIS — R7303 Prediabetes: Secondary | ICD-10-CM

## 2020-12-04 DIAGNOSIS — C8118 Nodular sclerosis classical Hodgkin lymphoma, lymph nodes of multiple sites: Secondary | ICD-10-CM

## 2020-12-04 DIAGNOSIS — G609 Hereditary and idiopathic neuropathy, unspecified: Secondary | ICD-10-CM

## 2020-12-04 NOTE — Progress Notes (Signed)
Subjective:    Patient ID: Cathy [name], female    DOB: [birth year], 75 y.o.   MRN: [id number]  HPI: Cathy [name] is a 75 y.o. female presenting for follow up.  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  She reports that chemotherapy has worsened her neuropathy.  NEUROPATHY Currently taking gabapentin 300 mg nightly with moderate relief.  Started with gabapentin 100 mg and 200 mg without any benefit.  Thinks the 300 mg helps some. Neuropathy status: better  Satisfied with current treatment?: yes Medication side effects: no Medication compliance:  excellent compliance Location: Bottoms of feet Pain: yes Severity:  Moderate to severe   Frequency:  Intermittent-worse at nighttime Bilateral: yes Symmetric: yes Numbness: no Decreased sensation: no Weakness: no Context: better Alleviating factors:  Aggravating factors:  Treatments attempted:  PRE-DIABETES Last hemoglobin A1c in February was 6.4%.  She has been diet controlled and is not taking any medication for diabetes.  She does report her fasting blood sugars have been more elevated lately.  She is getting steroids with her chemotherapy treatment.  She does report some knee weakness after working outside in the yard for a few minutes.  She reports she can sometimes examine the something and this improves.  She is wondering if these are low blood sugars. Hypoglycemic episodes: unsure Polydipsia/polyuria: no Visual disturbance: yes; harder to see recently - has had cataract surgery and sees Ophthalmologist yearly.  Appointment is coming up in July. Chest pain: no Paresthesias: yes Glucose Monitoring: yes  Accucheck frequency: occasionally fasting  Fasting glucose: 160 Taking Insulin?: no Pneumovax: Up to Date Influenza: Up to Date Aspirin: yes  Patient reports he gets a growth hormone every couple of  weeks with her active cancer treatment-her oncologist has recommended stopping Prolia for now.  HYPERTENSION / [place] Patient is currently taking hydrochlorothiazide 12.5 mg, losartan 100 mg and metoprolol XL 50 mg daily for blood pressure.  She is taking atorvastatin 20 mg daily for high cholesterol.  Follows with cardiology yearly.   Satisfied with current treatment? yes Duration of hypertension: chronic BP medication side effects: no Duration of hyperlipidemia: chronic Cholesterol medication side effects: no Aspirin: yes Recent stressors: no Recurrent headaches: no Visual changes: yes Palpitations: no Dyspnea: no Chest pain: no Lower extremity edema: no Dizzy/lightheaded: no  Outpatient Encounter Medications as of 12/04/2020  Medication Sig Note   Acetaminophen (TYLENOL) 325 MG CAPS Take by mouth.    acyclovir (ZOVIRAX) 400 MG tablet Take 1 tablet (400 mg total) by mouth 2 (two) times daily.    albuterol (VENTOLIN HFA) 108 (90 Base) MCG/ACT inhaler Inhale 2 puffs into the lungs every 6 (six) hours as needed for wheezing or shortness of breath.    aspirin EC 81 MG tablet Take 81 mg by mouth daily.    atorvastatin (LIPITOR) 20 MG tablet TAKE 1 TABLET BY MOUTH ONCE DAILY    BREO ELLIPTA 100-25 MCG/INH AEPB INHALE 1 PUFF INTO THE LUNGS DAILY    cetirizine (ZYRTEC) 10 MG tablet Take 1 tablet (10 mg total) by mouth daily.    denosumab (PROLIA) 60 MG/ML SOSY injection Inject 60 mg into the skin every 6 (six) months. (Patient not taking: Reported on 12/04/2020)    gabapentin (NEURONTIN) 300 MG capsule Take 1 capsule (300 mg total) by mouth 2 (two) times daily.    glucose blood (ACCU-CHEK AVIVA  PLUS) test strip Use to Monitor blood sugars daily    hydrochlorothiazide (MICROZIDE) 12.5 MG capsule Take 1 capsule (12.5 mg total) by mouth daily.    lidocaine-prilocaine (EMLA) cream Apply to affected area once 09/21/2020: Not started yet   losartan (COZAAR) 100 MG tablet Take 1 tablet (100  mg total) by mouth daily.    metoprolol succinate (TOPROL-XL) 50 MG 24 hr tablet Take with or immediately following a meal.    Multiple Vitamin (MULTIVITAMIN) capsule Take 1 capsule by mouth daily.    omeprazole (PRILOSEC) 20 MG capsule TAKE 1 CAPSULE BY MOUTH ONCE DAILY    ondansetron (ZOFRAN) 8 MG tablet Take 1 tablet (8 mg total) by mouth every 8 (eight) hours as needed. (Patient not taking: Reported on 12/04/2020) 09/21/2020: Not started yet   prochlorperazine (COMPAZINE) 10 MG tablet Take 1 tablet (10 mg total) by mouth every 6 (six) hours as needed (Nausea or vomiting). 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[facility]) 08/25/2020   Osteoporosis 07/16/2019   Chronic left shoulder pain 07/16/2019   Fatty liver 06/19/2018   Lung infiltrate on CT 09/19/2016   Neutrophilia 08/18/2015   Insomnia 03/10/2015   Benign paroxysmal positional vertigo 10/28/2014   OA (osteoarthritis) of knee 09/17/2013   Pre-diabetes 09/17/2013   Peripheral neuropathy 09/17/2013   Asthma    Essential hypertension    GERD (gastroesophageal reflux disease)    Hyperlipidemia    Hilar lymphadenopathy 08/01/2011    Past Medical History:  Diagnosis Date   Abnormal glucose    Allergy    Asthma    Allergy induced   Benign breast cyst in female    GERD (gastroesophageal reflux disease)    Hilar lymphadenopathy 08/01/2011   History of nuclear stress test 2009   Treadmill and Stress Myoview- no CAD   Hyperlipidemia    Hypertension    Lymphadenopathy of left cervical region  08/01/2011   Nephrolithiasis 1984   Osteopenia    PONV (postoperative nausea and vomiting)    Post-menopause    Pre-diabetes    borderline   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 Interim medical history since our last visit reviewed.  Review of Systems Per HPI unless specifically indicated above     Objective:    BP 120/80   Pulse 96   Temp 97.8 F (36.6 C)   Ht 5\' 3"  (1.6 m)   Wt 136 lb (61.7 kg)   SpO2 96%   BMI 24.09 kg/m   Wt Readings from Last 3 Encounters:  12/04/20 136 lb (61.7 kg)  11/24/20 140 lb 9.6 oz (63.8 kg)  11/10/20 142 lb 3.2 oz (64.5 kg)    Physical Exam Vitals and nursing note reviewed.  Constitutional:      General: She is not in acute distress.    Appearance: Normal appearance. She is not toxic-appearing.  HENT:     Head: Normocephalic and atraumatic.     Right Ear: External ear normal.  Left Ear: External ear normal.  Eyes:     General: No scleral icterus.    Extraocular Movements: Extraocular movements intact.  Neck:     Vascular: No carotid bruit.  Cardiovascular:     Rate and Rhythm: Normal rate and regular rhythm.     Heart sounds: Normal heart sounds. No murmur heard. Pulmonary:     Effort: Pulmonary effort is normal. No respiratory distress.     Breath sounds: Normal breath sounds. No wheezing, rhonchi or rales.  Abdominal:     General: Abdomen is flat. Bowel sounds are normal.     Palpations: Abdomen is soft.     Tenderness: There is no abdominal tenderness.  Musculoskeletal:        General: Normal range of motion.     Cervical back: Normal range of motion.     Right lower leg: No edema.     Left lower leg: No edema.  Skin:    General: Skin is warm and dry.     Capillary Refill: Capillary refill takes less than 2 seconds.     Coloration: Skin is not jaundiced.     Findings: No bruising.   Neurological:     General: No focal deficit present.     Mental Status: She is alert and oriented to person, place, and time.     Motor: No weakness.     Gait: Gait normal.  Psychiatric:        Mood and Affect: Mood normal.        Behavior: Behavior normal.        Thought Content: Thought content normal.        Judgment: Judgment normal.      Assessment & Plan:   Problem List Items Addressed This Visit       Cardiovascular and Mediastinum   Essential hypertension    Chronic.  Blood pressure is well controlled today in clinic.  I have asked the patient to check her blood pressure at home if she feels like it is low-if it is consistently running less than 100/60, we may want to decrease on the losartan.  For now, we will continue hydrochlorothiazide 12.5 mg, losartan 100 mg, and metoprolol XL 50 mg daily.  Recent kidney function and electrolytes checked at cancer center were normal so I will not repeat these today.  Follow-up in 4 months.         Nervous and Auditory   Peripheral neuropathy    Chronic, ongoing.  Has increased gabapentin to 300 mg nightly and does perceive some benefit.  Continue this medication continue to monitor closely and collaborate with oncology.  Follow-up in 4 months or sooner if needed.         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  Continue collaboration with oncology.       Relevant Medications   Acetaminophen (TYLENOL) 325 MG CAPS     Other   Pre-diabetes - Primary    We will recheck A1c today.  If remains elevated, may need to start on daily medication.  Patient also has neuropathy but question if this is related to chemotherapy side effect.  However, this may also be related to steroids that are given with chemotherapy and elevated blood sugar as result.  Follow-up in 4 months or sooner with any needs.  Relevant Orders   Hemoglobin A1c (Completed)   Hyperlipidemia    Chronic.  Will defer checking lipids today as patient is not fasting.  Plan to continue atorvastatin 20 mg daily for now.  Recent liver function looked good.  Follow-up 4 months.         Follow up plan: Return in about 4 months (around 04/05/2021) for follow up.

## 2020-12-05 LAB — HEMOGLOBIN A1C
Hgb A1c MFr Bld: 6 % of total Hgb — ABNORMAL HIGH (ref <5.7)
Mean Plasma Glucose: 126 mg/dL
eAG (mmol/L): 7 mmol/L

## 2020-12-08 ENCOUNTER — Other Ambulatory Visit: Payer: Self-pay

## 2020-12-08 ENCOUNTER — Ambulatory Visit: Payer: Medicare PPO | Admitting: Nurse Practitioner

## 2020-12-08 ENCOUNTER — Inpatient Hospital Stay: Payer: Medicare PPO

## 2020-12-08 VITALS — BP 139/65 | HR 79 | Temp 97.8°F | Resp 17 | Ht 63.0 in | Wt 139.1 lb

## 2020-12-08 DIAGNOSIS — C8118 Nodular sclerosis classical Hodgkin lymphoma, lymph nodes of multiple sites: Secondary | ICD-10-CM

## 2020-12-08 DIAGNOSIS — Z5111 Encounter for antineoplastic chemotherapy: Secondary | ICD-10-CM | POA: Diagnosis not present

## 2020-12-08 LAB — CMP (CANCER CENTER ONLY)
ALT: 12 U/L (ref 0–44)
AST: 23 U/L (ref 15–41)
Albumin: 3.8 g/dL (ref 3.5–5.0)
Alkaline Phosphatase: 80 U/L (ref 38–126)
Anion gap: 10 (ref 5–15)
BUN: 13 mg/dL (ref 8–23)
CO2: 26 mmol/L (ref 22–32)
Calcium: 9.2 mg/dL (ref 8.9–10.3)
Chloride: 103 mmol/L (ref 98–111)
Creatinine: 0.78 mg/dL (ref 0.44–1.00)
GFR, Estimated: >60 mL/min (ref >60)
Glucose, Bld: 116 mg/dL — ABNORMAL HIGH (ref 70–99)
Potassium: 4.4 mmol/L (ref 3.5–5.1)
Sodium: 139 mmol/L (ref 135–145)
Total Bilirubin: 0.4 mg/dL (ref 0.3–1.2)
Total Protein: 6.5 g/dL (ref 6.5–8.1)

## 2020-12-08 LAB — CBC WITH DIFFERENTIAL (CANCER CENTER ONLY)
Abs Immature Granulocytes: 0.54 10*3/uL — ABNORMAL HIGH (ref 0.00–0.07)
Basophils Absolute: 0.1 10*3/uL (ref 0.0–0.1)
Basophils Relative: 1 %
Eosinophils Absolute: 0.2 10*3/uL (ref 0.0–0.5)
Eosinophils Relative: 1 %
HCT: 32.2 % — ABNORMAL LOW (ref 36.0–46.0)
Hemoglobin: 10.5 g/dL — ABNORMAL LOW (ref 12.0–15.0)
Immature Granulocytes: 4 %
Lymphocytes Relative: 8 %
Lymphs Abs: 1.1 10*3/uL (ref 0.7–4.0)
MCH: 30.5 pg (ref 26.0–34.0)
MCHC: 32.6 g/dL (ref 30.0–36.0)
MCV: 93.6 fL (ref 80.0–100.0)
Monocytes Absolute: 1.2 10*3/uL — ABNORMAL HIGH (ref 0.1–1.0)
Monocytes Relative: 9 %
Neutro Abs: 10.9 10*3/uL — ABNORMAL HIGH (ref 1.7–7.7)
Neutrophils Relative %: 77 %
Platelet Count: 220 10*3/uL (ref 150–400)
RBC: 3.44 MIL/uL — ABNORMAL LOW (ref 3.87–5.11)
RDW: 19.9 % — ABNORMAL HIGH (ref 11.5–15.5)
WBC Count: 14.2 10*3/uL — ABNORMAL HIGH (ref 4.0–10.5)
nRBC: 0 % (ref 0.0–0.2)

## 2020-12-08 MED ORDER — DEXAMETHASONE SODIUM PHOSPHATE 10 MG/ML IJ SOLN
INTRAMUSCULAR | Status: AC
  Filled 2020-12-08: qty 1

## 2020-12-08 MED ORDER — HEPARIN SOD (PORK) LOCK FLUSH 100 UNIT/ML IV SOLN
500.0000 [IU] | Freq: Once | INTRAVENOUS | Status: AC | PRN
  Administered 2020-12-08: 500 [IU]
  Filled 2020-12-08: qty 5

## 2020-12-08 MED ORDER — SODIUM CHLORIDE 0.9% FLUSH
10.0000 mL | INTRAVENOUS | Status: DC | PRN
  Administered 2020-12-08: 10 mL
  Filled 2020-12-08: qty 10

## 2020-12-08 MED ORDER — VINBLASTINE SULFATE CHEMO INJECTION 1 MG/ML
3.0000 mg/m2 | Freq: Once | INTRAVENOUS | Status: AC
  Administered 2020-12-08: 5.1 mg via INTRAVENOUS
  Filled 2020-12-08: qty 5.1

## 2020-12-08 MED ORDER — DEXAMETHASONE SODIUM PHOSPHATE 10 MG/ML IJ SOLN
5.0000 mg | Freq: Once | INTRAMUSCULAR | Status: AC
  Administered 2020-12-08: 5 mg via INTRAVENOUS

## 2020-12-08 MED ORDER — SODIUM CHLORIDE 0.9% FLUSH
10.0000 mL | Freq: Once | INTRAVENOUS | Status: AC
  Administered 2020-12-08: 10 mL
  Filled 2020-12-08: qty 10

## 2020-12-08 MED ORDER — DOXORUBICIN HCL CHEMO IV INJECTION 2 MG/ML
25.0000 mg/m2 | Freq: Once | INTRAVENOUS | Status: AC
  Administered 2020-12-08: 42 mg via INTRAVENOUS
  Filled 2020-12-08: qty 21

## 2020-12-08 MED ORDER — PALONOSETRON HCL INJECTION 0.25 MG/5ML
INTRAVENOUS | Status: AC
  Filled 2020-12-08: qty 5

## 2020-12-08 MED ORDER — SODIUM CHLORIDE 0.9 % IV SOLN
150.0000 mg | Freq: Once | INTRAVENOUS | Status: AC
  Administered 2020-12-08: 150 mg via INTRAVENOUS
  Filled 2020-12-08: qty 150

## 2020-12-08 MED ORDER — PALONOSETRON HCL INJECTION 0.25 MG/5ML
0.2500 mg | Freq: Once | INTRAVENOUS | Status: AC
  Administered 2020-12-08: 0.25 mg via INTRAVENOUS

## 2020-12-08 MED ORDER — SODIUM CHLORIDE 0.9 % IV SOLN
375.0000 mg/m2 | Freq: Once | INTRAVENOUS | Status: AC
  Administered 2020-12-08: 630 mg via INTRAVENOUS
  Filled 2020-12-08: qty 63

## 2020-12-08 MED ORDER — SODIUM CHLORIDE 0.9 % IV SOLN
Freq: Once | INTRAVENOUS | Status: AC
  Filled 2020-12-08: qty 250

## 2020-12-08 NOTE — Assessment & Plan Note (Signed)
Chronic, ongoing.  Has increased gabapentin to 300 mg nightly and does perceive some benefit.  Continue this medication continue to monitor closely and collaborate with oncology.  Follow-up in 4 months or sooner if needed.

## 2020-12-08 NOTE — Assessment & Plan Note (Signed)
Chronic.  Blood pressure is well controlled today in clinic.  I have asked the patient to check her blood pressure at home if she feels like it is low-if it is consistently running less than 100/60, we may want to decrease on the losartan.  For now, we will continue hydrochlorothiazide 12.5 mg, losartan 100 mg, and metoprolol XL 50 mg daily.  Recent kidney function and electrolytes checked at cancer center were normal so I will not repeat these today.  Follow-up in 4 months.

## 2020-12-08 NOTE — Assessment & Plan Note (Signed)
Patient is being followed closely and is undergoing active cancer treatment with chemotherapy.  Continue collaboration with oncology.

## 2020-12-08 NOTE — Patient Instructions (Signed)
[facility]  Discharge Instructions: Thank you for choosing [facility] to provide your oncology and hematology care.   If you have a lab appointment with the [facility], please go directly to the [facility] and check in at the registration area.   Wear comfortable clothing and clothing appropriate for easy access to any Portacath or PICC line.   We strive to give you quality time with your provider. You may need to reschedule your appointment if you arrive late (15 or more minutes).  Arriving late affects you and other patients whose appointments are after yours.  Also, if you miss three or more appointments without notifying the office, you may be dismissed from the clinic at the provider's discretion.      For prescription refill requests, have your pharmacy contact our office and allow 72 hours for refills to be completed.    Today you received the following chemotherapy and/or immunotherapy agents: Doxorubicin (Adriamycin), Vinblastine (Velban), and Dacarbazine (DTIC).     To help prevent nausea and vomiting after your treatment, we encourage you to take your nausea medication as directed.  BELOW ARE SYMPTOMS THAT SHOULD BE REPORTED IMMEDIATELY: *FEVER GREATER THAN 100.4 F (38 C) OR HIGHER *CHILLS OR SWEATING *NAUSEA AND VOMITING THAT IS NOT CONTROLLED WITH YOUR NAUSEA MEDICATION *UNUSUAL SHORTNESS OF BREATH *UNUSUAL BRUISING OR BLEEDING *URINARY PROBLEMS (pain or burning when urinating, or frequent urination) *BOWEL PROBLEMS (unusual diarrhea, constipation, pain near the anus) TENDERNESS IN MOUTH AND THROAT WITH OR WITHOUT PRESENCE OF ULCERS (sore throat, sores in mouth, or a toothache) UNUSUAL RASH, SWELLING OR PAIN  UNUSUAL VAGINAL DISCHARGE OR ITCHING   Items with * indicate a potential emergency and should be followed up as soon as possible or go to the Emergency Department if any problems should occur.  Please show the  CHEMOTHERAPY ALERT CARD or IMMUNOTHERAPY ALERT CARD at check-in to the Emergency Department and triage nurse.  Should you have questions after your visit or need to cancel or reschedule your appointment, please contact [facility]  Dept: [phone]  and follow the prompts.  Office hours are 8:00 a.m. to 4:30 p.m. Monday - Friday. Please note that voicemails left after 4:00 p.m. may not be returned until the following business day.  We are closed weekends and major holidays. You have access to a nurse at all times for urgent questions. Please call the main number to the clinic Dept: [phone] and follow the prompts.   For any non-urgent questions, you may also contact your provider using MyChart. We now offer e-Visits for anyone 2 and older to request care online for non-urgent symptoms. For details visit [email or web address].   Also download the MyChart app! Go to the app store, search "MyChart", open the app, select [facility], and log in with your MyChart username and password.  Due to Covid, a mask is required upon entering the hospital/clinic. If you do not have a mask, one will be given to you upon arrival. For doctor visits, patients may have 1 support person aged 88 or older with them. For treatment visits, patients cannot have anyone with them due to current Covid guidelines and our immunocompromised population.

## 2020-12-08 NOTE — Assessment & Plan Note (Signed)
Chronic.  Will defer checking lipids today as patient is not fasting.  Plan to continue atorvastatin 20 mg daily for now.  Recent liver function looked good.  Follow-up 4 months.

## 2020-12-08 NOTE — Assessment & Plan Note (Signed)
We will recheck A1c today.  If remains elevated, may need to start on daily medication.  Patient also has neuropathy but question if this is related to chemotherapy side effect.  However, this may also be related to steroids that are given with chemotherapy and elevated blood sugar as result.  Follow-up in 4 months or sooner with any needs.

## 2020-12-10 ENCOUNTER — Inpatient Hospital Stay: Payer: Medicare PPO

## 2020-12-10 ENCOUNTER — Other Ambulatory Visit: Payer: Self-pay

## 2020-12-10 VITALS — BP 123/60 | HR 81 | Temp 98.1°F | Resp 18

## 2020-12-10 DIAGNOSIS — C8118 Nodular sclerosis classical Hodgkin lymphoma, lymph nodes of multiple sites: Secondary | ICD-10-CM

## 2020-12-10 DIAGNOSIS — Z5111 Encounter for antineoplastic chemotherapy: Secondary | ICD-10-CM | POA: Diagnosis not present

## 2020-12-10 MED ORDER — PEGFILGRASTIM-JMDB 6 MG/0.6ML ~~LOC~~ SOSY
PREFILLED_SYRINGE | SUBCUTANEOUS | Status: AC
  Filled 2020-12-10: qty 0.6

## 2020-12-10 MED ORDER — PEGFILGRASTIM-JMDB 6 MG/0.6ML ~~LOC~~ SOSY
6.0000 mg | PREFILLED_SYRINGE | Freq: Once | SUBCUTANEOUS | Status: AC
Start: 2020-12-10 — End: 2020-12-10
  Administered 2020-12-10: 6 mg via SUBCUTANEOUS

## 2020-12-11 ENCOUNTER — Encounter: Payer: Self-pay | Admitting: Hematology and Oncology

## 2020-12-22 ENCOUNTER — Other Ambulatory Visit: Payer: Self-pay | Admitting: Hematology and Oncology

## 2020-12-22 ENCOUNTER — Inpatient Hospital Stay: Payer: Medicare PPO | Admitting: Hematology and Oncology

## 2020-12-22 ENCOUNTER — Inpatient Hospital Stay: Payer: Medicare PPO | Attending: Hematology and Oncology

## 2020-12-22 ENCOUNTER — Other Ambulatory Visit: Payer: Self-pay | Admitting: Family Medicine

## 2020-12-22 ENCOUNTER — Other Ambulatory Visit: Payer: Self-pay

## 2020-12-22 ENCOUNTER — Inpatient Hospital Stay: Payer: Medicare PPO

## 2020-12-22 ENCOUNTER — Encounter: Payer: Self-pay | Admitting: Hematology and Oncology

## 2020-12-22 DIAGNOSIS — R0609 Other forms of dyspnea: Secondary | ICD-10-CM

## 2020-12-22 DIAGNOSIS — D6481 Anemia due to antineoplastic chemotherapy: Secondary | ICD-10-CM | POA: Diagnosis not present

## 2020-12-22 DIAGNOSIS — C8118 Nodular sclerosis classical Hodgkin lymphoma, lymph nodes of multiple sites: Secondary | ICD-10-CM

## 2020-12-22 DIAGNOSIS — Z5112 Encounter for antineoplastic immunotherapy: Secondary | ICD-10-CM | POA: Diagnosis present

## 2020-12-22 DIAGNOSIS — T451X5A Adverse effect of antineoplastic and immunosuppressive drugs, initial encounter: Secondary | ICD-10-CM

## 2020-12-22 DIAGNOSIS — G629 Polyneuropathy, unspecified: Secondary | ICD-10-CM | POA: Insufficient documentation

## 2020-12-22 DIAGNOSIS — G609 Hereditary and idiopathic neuropathy, unspecified: Secondary | ICD-10-CM

## 2020-12-22 DIAGNOSIS — Z5111 Encounter for antineoplastic chemotherapy: Secondary | ICD-10-CM

## 2020-12-22 DIAGNOSIS — M810 Age-related osteoporosis without current pathological fracture: Secondary | ICD-10-CM

## 2020-12-22 DIAGNOSIS — R0600 Dyspnea, unspecified: Secondary | ICD-10-CM | POA: Insufficient documentation

## 2020-12-22 DIAGNOSIS — Z79899 Other long term (current) drug therapy: Secondary | ICD-10-CM | POA: Diagnosis not present

## 2020-12-22 LAB — CBC WITH DIFFERENTIAL (CANCER CENTER ONLY)
Abs Immature Granulocytes: 0.45 10*3/uL — ABNORMAL HIGH (ref 0.00–0.07)
Basophils Absolute: 0.1 10*3/uL (ref 0.0–0.1)
Basophils Relative: 1 %
Eosinophils Absolute: 0.3 10*3/uL (ref 0.0–0.5)
Eosinophils Relative: 2 %
HCT: 33.1 % — ABNORMAL LOW (ref 36.0–46.0)
Hemoglobin: 10.9 g/dL — ABNORMAL LOW (ref 12.0–15.0)
Immature Granulocytes: 3 %
Lymphocytes Relative: 9 %
Lymphs Abs: 1.1 10*3/uL (ref 0.7–4.0)
MCH: 31.9 pg (ref 26.0–34.0)
MCHC: 32.9 g/dL (ref 30.0–36.0)
MCV: 96.8 fL (ref 80.0–100.0)
Monocytes Absolute: 1.2 10*3/uL — ABNORMAL HIGH (ref 0.1–1.0)
Monocytes Relative: 10 %
Neutro Abs: 9.9 10*3/uL — ABNORMAL HIGH (ref 1.7–7.7)
Neutrophils Relative %: 75 %
Platelet Count: 226 10*3/uL (ref 150–400)
RBC: 3.42 MIL/uL — ABNORMAL LOW (ref 3.87–5.11)
RDW: 18.1 % — ABNORMAL HIGH (ref 11.5–15.5)
WBC Count: 13.1 10*3/uL — ABNORMAL HIGH (ref 4.0–10.5)
nRBC: 0 % (ref 0.0–0.2)

## 2020-12-22 LAB — CMP (CANCER CENTER ONLY)
ALT: 12 U/L (ref 0–44)
AST: 19 U/L (ref 15–41)
Albumin: 3.8 g/dL (ref 3.5–5.0)
Alkaline Phosphatase: 82 U/L (ref 38–126)
Anion gap: 10 (ref 5–15)
BUN: 14 mg/dL (ref 8–23)
CO2: 28 mmol/L (ref 22–32)
Calcium: 9.6 mg/dL (ref 8.9–10.3)
Chloride: 103 mmol/L (ref 98–111)
Creatinine: 0.73 mg/dL (ref 0.44–1.00)
GFR, Estimated: >60 mL/min (ref >60)
Glucose, Bld: 112 mg/dL — ABNORMAL HIGH (ref 70–99)
Potassium: 4.3 mmol/L (ref 3.5–5.1)
Sodium: 141 mmol/L (ref 135–145)
Total Bilirubin: 0.3 mg/dL (ref 0.3–1.2)
Total Protein: 6.4 g/dL — ABNORMAL LOW (ref 6.5–8.1)

## 2020-12-22 MED ORDER — PALONOSETRON HCL INJECTION 0.25 MG/5ML
INTRAVENOUS | Status: AC
  Filled 2020-12-22: qty 5

## 2020-12-22 MED ORDER — SODIUM CHLORIDE 0.9% FLUSH
10.0000 mL | Freq: Once | INTRAVENOUS | Status: AC
  Administered 2020-12-22: 10 mL
  Filled 2020-12-22: qty 10

## 2020-12-22 MED ORDER — VINBLASTINE SULFATE CHEMO INJECTION 1 MG/ML
3.0000 mg/m2 | Freq: Once | INTRAVENOUS | Status: AC
  Administered 2020-12-22: 5.1 mg via INTRAVENOUS
  Filled 2020-12-22: qty 5.1

## 2020-12-22 MED ORDER — DEXAMETHASONE SODIUM PHOSPHATE 10 MG/ML IJ SOLN
5.0000 mg | Freq: Once | INTRAMUSCULAR | Status: AC
  Administered 2020-12-22: 5 mg via INTRAVENOUS

## 2020-12-22 MED ORDER — PALONOSETRON HCL INJECTION 0.25 MG/5ML
0.2500 mg | Freq: Once | INTRAVENOUS | Status: AC
  Administered 2020-12-22: 0.25 mg via INTRAVENOUS

## 2020-12-22 MED ORDER — DOXORUBICIN HCL CHEMO IV INJECTION 2 MG/ML
25.0000 mg/m2 | Freq: Once | INTRAVENOUS | Status: AC
  Administered 2020-12-22: 42 mg via INTRAVENOUS
  Filled 2020-12-22: qty 21

## 2020-12-22 MED ORDER — DEXAMETHASONE SODIUM PHOSPHATE 10 MG/ML IJ SOLN
INTRAMUSCULAR | Status: AC
  Filled 2020-12-22: qty 1

## 2020-12-22 MED ORDER — SODIUM CHLORIDE 0.9 % IV SOLN
Freq: Once | INTRAVENOUS | Status: AC
  Filled 2020-12-22: qty 250

## 2020-12-22 MED ORDER — SODIUM CHLORIDE 0.9 % IV SOLN
375.0000 mg/m2 | Freq: Once | INTRAVENOUS | Status: AC
  Administered 2020-12-22: 630 mg via INTRAVENOUS
  Filled 2020-12-22: qty 63

## 2020-12-22 MED ORDER — SODIUM CHLORIDE 0.9 % IV SOLN
150.0000 mg | Freq: Once | INTRAVENOUS | Status: AC
  Administered 2020-12-22: 150 mg via INTRAVENOUS
  Filled 2020-12-22: qty 150

## 2020-12-22 NOTE — Assessment & Plan Note (Signed)
She has excellent response to treatment on previous imaging However, her treatment is complicated by peripheral neuropathy Since discontinuation of Brentuximab, her neuropathy is stable She will be due for echocardiogram again at the end of the month We will proceed with treatment without delay

## 2020-12-22 NOTE — Assessment & Plan Note (Signed)
She has significant peripheral neuropathy She tolerated gabapentin well We will continue the same

## 2020-12-22 NOTE — Patient Instructions (Signed)
[facility]  Discharge Instructions: Thank you for choosing [facility] to provide your oncology and hematology care.   If you have a lab appointment with the [facility], please go directly to the [facility] and check in at the registration area.   Wear comfortable clothing and clothing appropriate for easy access to any Portacath or PICC line.   We strive to give you quality time with your provider. You may need to reschedule your appointment if you arrive late (15 or more minutes).  Arriving late affects you and other patients whose appointments are after yours.  Also, if you miss three or more appointments without notifying the office, you may be dismissed from the clinic at the provider's discretion.      For prescription refill requests, have your pharmacy contact our office and allow 72 hours for refills to be completed.    Today you received the following chemotherapy and/or immunotherapy agents: Doxorubicin (Adriamycin), Vinblastine (Velban), and Dacarbazine (DTIC).     To help prevent nausea and vomiting after your treatment, we encourage you to take your nausea medication as directed.  BELOW ARE SYMPTOMS THAT SHOULD BE REPORTED IMMEDIATELY: *FEVER GREATER THAN 100.4 F (38 C) OR HIGHER *CHILLS OR SWEATING *NAUSEA AND VOMITING THAT IS NOT CONTROLLED WITH YOUR NAUSEA MEDICATION *UNUSUAL SHORTNESS OF BREATH *UNUSUAL BRUISING OR BLEEDING *URINARY PROBLEMS (pain or burning when urinating, or frequent urination) *BOWEL PROBLEMS (unusual diarrhea, constipation, pain near the anus) TENDERNESS IN MOUTH AND THROAT WITH OR WITHOUT PRESENCE OF ULCERS (sore throat, sores in mouth, or a toothache) UNUSUAL RASH, SWELLING OR PAIN  UNUSUAL VAGINAL DISCHARGE OR ITCHING   Items with * indicate a potential emergency and should be followed up as soon as possible or go to the Emergency Department if any problems should occur.  Please show the  CHEMOTHERAPY ALERT CARD or IMMUNOTHERAPY ALERT CARD at check-in to the Emergency Department and triage nurse.  Should you have questions after your visit or need to cancel or reschedule your appointment, please contact [facility]  Dept: [phone]  and follow the prompts.  Office hours are 8:00 a.m. to 4:30 p.m. Monday - Friday. Please note that voicemails left after 4:00 p.m. may not be returned until the following business day.  We are closed weekends and major holidays. You have access to a nurse at all times for urgent questions. Please call the main number to the clinic Dept: [phone] and follow the prompts.   For any non-urgent questions, you may also contact your provider using MyChart. We now offer e-Visits for anyone 47 and older to request care online for non-urgent symptoms. For details visit [email or web address].   Also download the MyChart app! Go to the app store, search "MyChart", open the app, select [facility], and log in with your MyChart username and password.  Due to Covid, a mask is required upon entering the hospital/clinic. If you do not have a mask, one will be given to you upon arrival. For doctor visits, patients may have 1 support person aged 36 or older with them. For treatment visits, patients cannot have anyone with them due to current Covid guidelines and our immunocompromised population.

## 2020-12-22 NOTE — Assessment & Plan Note (Signed)

## 2020-12-22 NOTE — Progress Notes (Addendum)
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 The patient denies recent history of bleeding such as epistaxis, hematuria or hematochezia. She is asymptomatic from the anemia. I will observe for now.  She does not require transfusion now. I will continue the chemotherapy at current dose without dosage adjustment.  If the anemia gets progressive worse in the future, I might have to delay her treatment or adjust the chemotherapy dose.   No orders of the defined types were placed in this encounter.   All questions were answered. The patient knows to call the clinic with any problems, questions or concerns. The total time spent in the appointment was 20 minutes encounter with patients including review of chart and various tests results, discussions about plan of care and coordination of care plan   Heath Lark, MD 12/22/2020 12:52 PM  INTERVAL HISTORY: Please see below for problem oriented charting. 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 Extensive hypermetabolic lymphadenopathy involving the neck, chest and abdomen as detailed above (Deauville 5). I do not see any pelvic disease. 2. Possible involvement of the spleen. 3. Osseous involvement is also noted.     09/03/2020 Pathology Results   A. LYMPH NODE, LEFT NECK, EXCISION:  -Lymphoproliferative disorder consistent with classical Hodgkin lymphoma -See comment   COMMENT:   The sections show effacement of the lymph nodal architecture by a primarily nodular lymphoproliferative process.  In the more cellular areas, the nodules are composed of a mixture of small lymphocytes, plasma cells, abundant histiocytes in addition to atypical large mononuclear and multi-lobated lymphoid appearing cells with variably prominent nucleoli.  The nodules are surrounded by dense collagenous fibrosis.  In the less cellular areas, there appears to be a fibroblastic proliferation and a much more depleted cellular appearance but scattering of large atypical lymphoid appearing cells is still seen.   Flow cytometric analysis was performed [facility] [phone]) and shows predominance of T lymphocytes with nonspecific changes including relative abundance of CD8 positive cells and reversal of the CD4: CD8 ratio.  No monoclonal B-cell population identified.  A battery of  immunohistochemical stains was performed and shows that the large atypical  lymphoid appearing cells are positive for CD30, Mum-1, PAX 5 (weak), CD23 in addition to variable but generally weak positivity for CD79a and CD20.  Only scattered large atypical lymphoid cells show weak Golgi positivity for CD15.  The large atypical lymphoid cells are negative for LCA, CD10, CD3, CD5, EMA, ALK protein and EBV in situ  hybridization.  The small lymphoid cells in the background show a mixture of T and B cells with predominance of T cells.  T cells show a mixture of CD4 and CD8 positive cells with slight predominance of the latter.  The overall findings are most consistent with classical Hodgkin lymphoma, nodular sclerosis subtype.      09/21/2020 Procedure   Placement of a subcutaneous port device. Catheter tip at the superior cavoatrial junction.   09/25/2020 Echocardiogram   1. Left ventricular ejection fraction, by estimation, is 60 to 65%. The left ventricle has normal function. The left ventricle has no regional wall motion abnormalities. There is mild concentric left ventricular hypertrophy. Left ventricular diastolic parameters are consistent with Grade I diastolic dysfunction (impaired relaxation). The average left ventricular global longitudinal strain is -19.6 %. The global longitudinal strain is normal.  2. Right ventricular systolic function is normal. The right ventricular size is normal.  3. A small pericardial effusion is present. The pericardial effusion is surrounding the apex and anterior to the right ventricle. There is no evidence of cardiac tamponade.  4. The mitral valve is normal in structure. No evidence of mitral valve regurgitation.  5. The aortic valve is tricuspid. Aortic valve regurgitation is not visualized. Mild aortic valve sclerosis is present, with no evidence of aortic valve stenosis.  6. The inferior vena cava is normal in size with greater than 50% respiratory variability, suggesting right atrial pressure of 3 mmHg.    Chemotherapy    Patient is on  Treatment Plan: HODGKINS LYMPHOMA  A + AVD Q28D       11/23/2020 PET scan   1. Interval significant response to therapy. The previously demonstrated hypermetabolic adenopathy in the neck, chest and abdomen has nearly completely resolved. Deauville 1 and 2. Slightly greater residual metabolic activity within hilar lymph nodes is less specific and may be reactive. 2. Resolution of previously demonstrated focal hypermetabolic activity in the spleen and bones. Diffuse bone marrow activity attributed to treatment response. 3. Stable incidental findings as detailed above.   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[place])  09/14/2020 Initial Diagnosis   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[place])    09/29/2020 -  Chemotherapy    Patient is on Treatment Plan: HODGKINS LYMPHOMA  A + AVD Q28D         REVIEW OF SYSTEMS:   Constitutional: Denies fevers, chills or abnormal weight loss Eyes: Denies blurriness of vision Ears, nose, mouth, throat, and face: Denies mucositis or sore throat Respiratory: Denies cough, dyspnea or wheezes Cardiovascular: Denies palpitation, chest discomfort or lower extremity swelling Gastrointestinal:  Denies nausea, heartburn or change in bowel habits Skin: Denies abnormal skin rashes Lymphatics: Denies new lymphadenopathy or easy bruising Behavioral/Psych: Mood is stable, no new changes  All other systems were reviewed with the patient and are negative.  I have reviewed the past medical history, past surgical history, social history and family history with the patient and they are unchanged from previous note.  ALLERGIES:  is allergic to codeine.  MEDICATIONS:  Current Outpatient Medications  Medication Sig Dispense Refill   Acetaminophen (TYLENOL)  325 MG CAPS Take by mouth.     acyclovir (ZOVIRAX) 400 MG tablet Take 1 tablet (400 mg total) by mouth 2 (two) times daily. 60 tablet 6   albuterol (VENTOLIN HFA) 108 (90 Base) MCG/ACT inhaler Inhale 2 puffs into the lungs  every 6 (six) hours as needed for wheezing or shortness of breath. 8 g 0   aspirin EC 81 MG tablet Take 81 mg by mouth daily.     atorvastatin (LIPITOR) 20 MG tablet TAKE 1 TABLET BY MOUTH ONCE DAILY 90 tablet 3   BREO ELLIPTA 100-25 MCG/INH AEPB INHALE 1 PUFF INTO THE LUNGS DAILY 60 each 5   cetirizine (ZYRTEC) 10 MG tablet Take 1 tablet (10 mg total) by mouth daily. 90 tablet 2   denosumab (PROLIA) 60 MG/ML SOSY injection Inject 60 mg into the skin every 6 (six) months. (Patient not taking: Reported on 12/04/2020)     gabapentin (NEURONTIN) 300 MG capsule Take 1 capsule (300 mg total) by mouth 2 (two) times daily. 60 capsule 1   glucose blood (ACCU-CHEK AVIVA PLUS) test strip Use to Monitor blood sugars daily 100 each 5   hydrochlorothiazide (MICROZIDE) 12.5 MG capsule Take 1 capsule (12.5 mg total) by mouth daily. 90 capsule 3   lidocaine-prilocaine (EMLA) cream Apply to affected area once 30 g 3   losartan (COZAAR) 100 MG tablet Take 1 tablet (100 mg total) by mouth daily. 90 tablet 2   metoprolol succinate (TOPROL-XL) 50 MG 24 hr tablet Take with or immediately following a meal. 90 tablet 2   Multiple Vitamin (MULTIVITAMIN) capsule Take 1 capsule by mouth daily.     omeprazole (PRILOSEC) 20 MG capsule TAKE 1 CAPSULE BY MOUTH ONCE DAILY 90 capsule 3   ondansetron (ZOFRAN) 8 MG tablet Take 1 tablet (8 mg total) by mouth every 8 (eight) hours as needed. (Patient not taking: Reported on 12/04/2020) 30 tablet 1   prochlorperazine (COMPAZINE) 10 MG tablet Take 1 tablet (10 mg total) by mouth every 6 (six) hours as needed (Nausea or vomiting). (Patient not taking: Reported on 12/04/2020) 30 tablet 1   Current Facility-Administered Medications  Medication Dose Route Frequency Provider Last Rate Last Admin   denosumab (PROLIA) injection 60 mg  60 mg Subcutaneous Q6 months Ripley, Kawanta F, MD   60 mg at 08/10/20 1000    PHYSICAL EXAMINATION: ECOG PERFORMANCE STATUS: 1 - Symptomatic but completely  ambulatory  Vitals:   12/22/20 0908  BP: 118/66  Pulse: 87  Resp: 18  Temp: 98.9 F (37.2 C)  SpO2: 100%   Filed Weights   12/22/20 0908  Weight: 140 lb (63.5 kg)    GENERAL:alert, no distress and comfortable SKIN: skin color, texture, turgor are normal, no rashes or significant lesions EYES: normal, Conjunctiva are pink and non-injected, sclera clear OROPHARYNX:no exudate, no erythema and lips, buccal mucosa, and tongue normal  NECK: supple, thyroid normal size, non-tender, without nodularity LYMPH:  no palpable lymphadenopathy in the cervical, axillary or inguinal LUNGS: clear to auscultation and percussion with normal breathing effort HEART: regular rate & rhythm and no murmurs and no lower extremity edema ABDOMEN:abdomen soft, non-tender and normal bowel sounds Musculoskeletal:no cyanosis of digits and no clubbing  NEURO: alert & oriented x 3 with fluent speech, no focal motor/sensory deficits  LABORATORY DATA:  I have reviewed the data as listed    Component Value Date/Time   NA 141 12/22/2020 0834   NA 143 08/15/2016 1037   K 4.3 12/22/2020 0834  K 4.8 08/15/2016 1037   CL 103 12/22/2020 0834   CL 103 07/16/2012 1304   CO2 28 12/22/2020 0834   CO2 27 08/15/2016 1037   GLUCOSE 112 (H) 12/22/2020 0834   GLUCOSE 111 08/15/2016 1037   GLUCOSE 112 (H) 07/16/2012 1304   BUN 14 12/22/2020 0834   BUN 12.7 08/15/2016 1037   CREATININE 0.73 12/22/2020 0834   CREATININE 0.95 (H) 08/10/2020 1041   CREATININE 0.9 08/15/2016 1037   CALCIUM 9.6 12/22/2020 0834   CALCIUM 10.3 08/13/2020 1117   CALCIUM 10.5 (H) 08/15/2016 1037   PROT 6.4 (L) 12/22/2020 0834   PROT 7.9 08/15/2016 1037   ALBUMIN 3.8 12/22/2020 0834   ALBUMIN 4.5 08/15/2016 1037   AST 19 12/22/2020 0834   AST 30 08/15/2016 1037   ALT 12 12/22/2020 0834   ALT 29 08/15/2016 1037   ALKPHOS 82 12/22/2020 0834   ALKPHOS 67 08/15/2016 1037   BILITOT 0.3 12/22/2020 0834   BILITOT 0.80 08/15/2016 1037    GFRNONAA >60 12/22/2020 0834   GFRNONAA 62 12/05/2017 1000   GFRAA 72 12/05/2017 1000    No results found for: SPEP, UPEP  Lab Results  Component Value Date   WBC 13.1 (H) 12/22/2020   NEUTROABS 9.9 (H) 12/22/2020   HGB 10.9 (L) 12/22/2020   HCT 33.1 (L) 12/22/2020   MCV 96.8 12/22/2020   PLT 226 12/22/2020      Chemistry      Component Value Date/Time   NA 141 12/22/2020 0834   NA 143 08/15/2016 1037   K 4.3 12/22/2020 0834   K 4.8 08/15/2016 1037   CL 103 12/22/2020 0834   CL 103 07/16/2012 1304   CO2 28 12/22/2020 0834   CO2 27 08/15/2016 1037   BUN 14 12/22/2020 0834   BUN 12.7 08/15/2016 1037   CREATININE 0.73 12/22/2020 0834   CREATININE 0.95 (H) 08/10/2020 1041   CREATININE 0.9 08/15/2016 1037      Component Value Date/Time   CALCIUM 9.6 12/22/2020 0834   CALCIUM 10.3 08/13/2020 1117   CALCIUM 10.5 (H) 08/15/2016 1037   ALKPHOS 82 12/22/2020 0834   ALKPHOS 67 08/15/2016 1037   AST 19 12/22/2020 0834   AST 30 08/15/2016 1037   ALT 12 12/22/2020 0834   ALT 29 08/15/2016 1037   BILITOT 0.3 12/22/2020 0834   BILITOT 0.80 08/15/2016 1037

## 2020-12-24 ENCOUNTER — Other Ambulatory Visit: Payer: Self-pay

## 2020-12-24 ENCOUNTER — Inpatient Hospital Stay: Payer: Medicare PPO

## 2020-12-24 VITALS — BP 119/52 | HR 72 | Temp 98.5°F | Resp 16

## 2020-12-24 DIAGNOSIS — C8118 Nodular sclerosis classical Hodgkin lymphoma, lymph nodes of multiple sites: Secondary | ICD-10-CM

## 2020-12-24 DIAGNOSIS — Z5112 Encounter for antineoplastic immunotherapy: Secondary | ICD-10-CM | POA: Diagnosis not present

## 2020-12-24 MED ORDER — PEGFILGRASTIM-JMDB 6 MG/0.6ML ~~LOC~~ SOSY
PREFILLED_SYRINGE | SUBCUTANEOUS | Status: AC
  Filled 2020-12-24: qty 0.6

## 2020-12-24 MED ORDER — PEGFILGRASTIM-JMDB 6 MG/0.6ML ~~LOC~~ SOSY
6.0000 mg | PREFILLED_SYRINGE | Freq: Once | SUBCUTANEOUS | Status: AC
  Administered 2020-12-24: 6 mg via SUBCUTANEOUS

## 2020-12-24 NOTE — Patient Instructions (Signed)

## 2020-12-24 NOTE — Progress Notes (Signed)
Cardiology Office Note   Date:  01/01/2021   ID:  Cathy [name], [place] Feb 05, 1946, MRN [id number]  PCP:  Eulogio Bear, NP  Cardiologist:   Jenkins Rouge, MD   No chief complaint on file.     History of Present Illness:  75 y.o. first seen 08/09/16 for chest pain. Atypical with GI overtones GERD CRF;s HTN DM-2  Previous echo 11/03/14 EF 55-60% Had f/u myovue 08/16/16 normal no ischemia EF 60%   Has had adenopathy in neck chest and upper abdomen with biopsy showing atypical lymphoid hyperplasia.  Sees oncology Cathy [name] for this.  Adenopathy progressive by CT and had left neck biopsy by Dr Cathy [name] 09/03/20 PET scan with extensive hypermetabolic lymphadenopathy involving neck chest and abdomen  Biopsy with lymphoma Seeing Dr [name] diagnosed with Hodgkins lymphoma nodular sclerosis  Initial Rx complicated by neuropathy had nice response by f/u PET 11/23/20   TTE 09/25/20 EF 60-65% strain -19.6   She is sedentary and eats too much Cathy [name] has a 74 yo and 73 yo grand-children  Has 13/8 yo grand children  Past Medical History:  Diagnosis Date   Abnormal glucose    Allergy    Asthma    Allergy induced   Benign breast cyst in female    GERD (gastroesophageal reflux disease)    Hilar lymphadenopathy 08/01/2011   History of nuclear stress test 2009   Treadmill and Stress Myoview- no CAD   Hyperlipidemia    Hypertension    Lymphadenopathy of left cervical region 08/01/2011   Nephrolithiasis 1984   Osteopenia    PONV (postoperative nausea and vomiting)    Post-menopause    Pre-diabetes    borderline   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    Grade 2   Vision changes     Past Surgical History:  Procedure Laterality Date   CHOLECYSTECTOMY     IR IMAGING GUIDED PORT INSERTION  09/21/2020   LITHOTRIPSY  1984   LYMPH NODE BIOPSY     MASS BIOPSY Left 09/03/2020   Procedure: LEFT NECK JUGULAR NODE;  Surgeon: Rozetta Nunnery, MD;  Location: [facility];  Service: ENT;  Laterality: Left;   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   Procedure: LEFT TOTAL KNEE ARTHROPLASTY;  Surgeon: Gaynelle Arabian, MD;  Location: [facility];  Service: Orthopedics;  Laterality: Left;     Current Outpatient Medications  Medication Sig Dispense Refill   Acetaminophen (TYLENOL) 325 MG CAPS Take by mouth.     acyclovir (ZOVIRAX) 400 MG tablet Take 1 tablet (400 mg total) by mouth 2 (two) times daily. 60 tablet 6   albuterol (VENTOLIN HFA) 108 (90 Base) MCG/ACT inhaler Inhale 2 puffs into the lungs every 6 (six) hours as needed for wheezing or shortness of breath. 8 g 0   aspirin EC 81 MG tablet Take 81 mg by mouth daily.     atorvastatin (LIPITOR) 20 MG tablet TAKE 1 TABLET BY MOUTH ONCE DAILY 90 tablet 3   BREO ELLIPTA 100-25 MCG/INH AEPB INHALE 1 PUFF INTO THE LUNGS DAILY 60 each 5   cetirizine (ZYRTEC) 10 MG tablet Take 1 tablet (10 mg total) by mouth daily. 90 tablet 2   denosumab (PROLIA) 60 MG/ML SOSY injection Inject 60 mg into the skin every 6 (six) months.     gabapentin (NEURONTIN) 300 MG capsule Take 1 capsule (300 mg total) by mouth 2 (two) times daily. 60 capsule 1   glucose blood (ACCU-CHEK  AVIVA PLUS) test strip Use to Monitor blood sugars daily 100 each 5   hydrochlorothiazide (MICROZIDE) 12.5 MG capsule Take 1 capsule (12.5 mg total) by mouth daily. 90 capsule 3   lidocaine-prilocaine (EMLA) cream Apply to affected area once 30 g 3   losartan (COZAAR) 100 MG tablet Take 1 tablet (100 mg total) by mouth daily. 90 tablet 2   metoprolol succinate (TOPROL-XL) 50 MG 24 hr tablet Take with or immediately following a meal. 90 tablet 2   Multiple Vitamin (MULTIVITAMIN) capsule Take 1 capsule by mouth daily.     omeprazole (PRILOSEC) 20 MG capsule TAKE 1 CAPSULE BY MOUTH ONCE DAILY 90 capsule 3   ondansetron (ZOFRAN) 8 MG tablet Take 1 tablet (8 mg total) by mouth every 8 (eight) hours as needed. 30 tablet 1   prochlorperazine (COMPAZINE) 10 MG tablet  Take 1 tablet (10 mg total) by mouth every 6 (six) hours as needed (Nausea or vomiting). 30 tablet 1   Current Facility-Administered Medications  Medication Dose Route Frequency Provider Last Rate Last Admin   denosumab (PROLIA) injection 60 mg  60 mg Subcutaneous Q6 months Buelah Manis, Kawanta F, MD   60 mg at 08/10/20 1000    Allergies:   Codeine    Social History:  The patient  reports that she has never smoked. She has never used smokeless tobacco. She reports that she does not drink alcohol and does not use drugs.   Family History:  The patient's family history includes Alcohol abuse in her brother; Asthma in her sister; Breast cancer in her cousin; Cancer in her father, maternal aunt, and maternal aunt; Dementia in her mother; Diabetes in her brother; Heart attack in her mother; Hypertension in her mother; Stroke in her mother.    ROS:  Please see the history of present illness.   Otherwise, review of systems are positive for none.   All other systems are reviewed and negative.    PHYSICAL EXAM: VS:  BP (!) 124/58   Pulse 87   Ht 5\' 3"  (1.6 m)   Wt 62.8 kg   SpO2 95%   BMI 24.52 kg/m  , BMI Body mass index is 24.52 kg/m. Affect appropriate Healthy:  appears stated age 59: left neck adenopathy post biopsy  Neck supple with cervical lymphadenopathy  JVP normal no bruits no thyromegaly Lungs clear with no wheezing and good diaphragmatic motion Heart:  S1/S2 no murmur, no rub, gallop or click PMI normal Abdomen: benighn, BS positve, no tenderness, no AAA no bruit.  No HSM or HJR Distal pulses intact with no bruits Left LLE  Edema post left TKR  Neuro non-focal Skin warm and dry No muscular weakness   EKG:   09/03/19 SR rate 75 normal   Recent Labs: 01/13/2020: TSH 2.67 12/22/2020: ALT 12; BUN 14; Creatinine 0.73; Hemoglobin 10.9; Platelet Count 226; Potassium 4.3; Sodium 141    Lipid Panel    Component Value Date/Time   CHOL 140 08/10/2020 1041   TRIG 147  08/10/2020 1041   HDL 31 (L) 08/10/2020 1041   CHOLHDL 4.5 08/10/2020 1041   VLDL 34 (H) 12/06/2016 0932   LDLCALC 85 08/10/2020 1041      Wt Readings from Last 3 Encounters:  01/01/21 62.8 kg  12/22/20 63.5 kg  12/08/20 63.1 kg      Other studies Reviewed: Additional studies/ records that were reviewed today include: notes Dr Buelah Manis labs and ECG Echo 2016.and op note Dr Cathy [name] 09/03/20    ASSESSMENT  AND PLAN:  1. Chest Pain atypical normal myovue 08/16/16 observe  2. GERD: continue H2 blocker low carb diet avoid excess NSAI's 3. DM  Discussed low carb diet.  Target hemoglobin A1c is 6.5 or less.  Continue current medications. 4. Hodgkins Lymphoma:  nodular sclerosis post chemo f/u [name] EF/strain  Normal by echo in April and repeat this Friday  5. HTN:  Elevated with LE edema on left start HCTZ 12.5 mg daily  F/u bMET in 4 weeks with Cathy  6. Lipids:  On statin LDL at goal with no documented vascular disease    F/u in a year   Jenkins Rouge

## 2021-01-01 ENCOUNTER — Ambulatory Visit: Payer: Medicare PPO | Admitting: Cardiovascular Disease

## 2021-01-01 ENCOUNTER — Encounter (INDEPENDENT_AMBULATORY_CARE_PROVIDER_SITE_OTHER): Payer: Self-pay

## 2021-01-01 ENCOUNTER — Other Ambulatory Visit: Payer: Self-pay

## 2021-01-01 ENCOUNTER — Encounter: Payer: Self-pay | Admitting: Cardiovascular Disease

## 2021-01-01 VITALS — BP 124/58 | HR 87 | Ht 63.0 in | Wt 138.4 lb

## 2021-01-01 DIAGNOSIS — C8112 Nodular sclerosis classical Hodgkin lymphoma, intrathoracic lymph nodes: Secondary | ICD-10-CM | POA: Diagnosis not present

## 2021-01-01 DIAGNOSIS — E782 Mixed hyperlipidemia: Secondary | ICD-10-CM | POA: Diagnosis not present

## 2021-01-01 DIAGNOSIS — R079 Chest pain, unspecified: Secondary | ICD-10-CM

## 2021-01-01 DIAGNOSIS — I10 Essential (primary) hypertension: Secondary | ICD-10-CM | POA: Diagnosis not present

## 2021-01-01 NOTE — Patient Instructions (Addendum)
Medication Instructions:  *If you need a refill on your cardiac medications before your next appointment, please call your pharmacy*  Lab Work: If you have labs (blood work) drawn today and your tests are completely normal, you will receive your results only by: MyChart Message (if you have MyChart) OR A paper copy in the mail If you have any lab test that is abnormal or we need to change your treatment, we will call you to review the results.  Testing/Procedures: None ordered today.  Follow-Up: At [facility], you and your health needs are our priority.  As part of our continuing mission to provide you with exceptional heart care, we have created designated Provider Care Teams.  These Care Teams include your primary Cardiologist (physician) and Advanced Practice Providers (APPs -  Physician Assistants and Nurse Practitioners) who all work together to provide you with the care you need, when you need it.  We recommend signing up for the patient portal called "MyChart".  Sign up information is provided on this After Visit Summary.  MyChart is used to connect with patients for Virtual Visits (Telemedicine).  Patients are able to view lab/test results, encounter notes, upcoming appointments, etc.  Non-urgent messages can be sent to your provider as well.   To learn more about what you can do with MyChart, go to [email or web address].    Your next appointment:   6 month(s)  The format for your next appointment:   In Person  Provider:   You may see Peter Nishan, MD or one of the following Advanced Practice Providers on your designated Care Team:   Laura Ingold, NP  

## 2021-01-05 ENCOUNTER — Inpatient Hospital Stay: Payer: Medicare PPO

## 2021-01-05 ENCOUNTER — Other Ambulatory Visit: Payer: Self-pay

## 2021-01-05 ENCOUNTER — Other Ambulatory Visit (HOSPITAL_COMMUNITY): Payer: Medicare PPO

## 2021-01-05 VITALS — BP 136/65 | HR 83 | Temp 98.2°F | Resp 18 | Wt 141.2 lb

## 2021-01-05 DIAGNOSIS — C8118 Nodular sclerosis classical Hodgkin lymphoma, lymph nodes of multiple sites: Secondary | ICD-10-CM

## 2021-01-05 DIAGNOSIS — Z5112 Encounter for antineoplastic immunotherapy: Secondary | ICD-10-CM | POA: Diagnosis not present

## 2021-01-05 LAB — CMP (CANCER CENTER ONLY)
ALT: 17 U/L (ref 0–44)
AST: 26 U/L (ref 15–41)
Albumin: 4.1 g/dL (ref 3.5–5.0)
Alkaline Phosphatase: 89 U/L (ref 38–126)
Anion gap: 11 (ref 5–15)
BUN: 9 mg/dL (ref 8–23)
CO2: 28 mmol/L (ref 22–32)
Calcium: 9.5 mg/dL (ref 8.9–10.3)
Chloride: 103 mmol/L (ref 98–111)
Creatinine: 0.74 mg/dL (ref 0.44–1.00)
GFR, Estimated: >60 mL/min (ref >60)
Glucose, Bld: 124 mg/dL — ABNORMAL HIGH (ref 70–99)
Potassium: 4.2 mmol/L (ref 3.5–5.1)
Sodium: 142 mmol/L (ref 135–145)
Total Bilirubin: 0.3 mg/dL (ref 0.3–1.2)
Total Protein: 6.7 g/dL (ref 6.5–8.1)

## 2021-01-05 LAB — CBC WITH DIFFERENTIAL (CANCER CENTER ONLY)
Abs Immature Granulocytes: 0.62 10*3/uL — ABNORMAL HIGH (ref 0.00–0.07)
Basophils Absolute: 0.1 10*3/uL (ref 0.0–0.1)
Basophils Relative: 1 %
Eosinophils Absolute: 0.4 10*3/uL (ref 0.0–0.5)
Eosinophils Relative: 3 %
HCT: 34.3 % — ABNORMAL LOW (ref 36.0–46.0)
Hemoglobin: 11.3 g/dL — ABNORMAL LOW (ref 12.0–15.0)
Immature Granulocytes: 5 %
Lymphocytes Relative: 9 %
Lymphs Abs: 1.2 10*3/uL (ref 0.7–4.0)
MCH: 31.7 pg (ref 26.0–34.0)
MCHC: 32.9 g/dL (ref 30.0–36.0)
MCV: 96.1 fL (ref 80.0–100.0)
Monocytes Absolute: 1.3 10*3/uL — ABNORMAL HIGH (ref 0.1–1.0)
Monocytes Relative: 9 %
Neutro Abs: 10.2 10*3/uL — ABNORMAL HIGH (ref 1.7–7.7)
Neutrophils Relative %: 73 %
Platelet Count: 232 10*3/uL (ref 150–400)
RBC: 3.57 MIL/uL — ABNORMAL LOW (ref 3.87–5.11)
RDW: 15.9 % — ABNORMAL HIGH (ref 11.5–15.5)
WBC Count: 13.8 10*3/uL — ABNORMAL HIGH (ref 4.0–10.5)
nRBC: 0 % (ref 0.0–0.2)

## 2021-01-05 MED ORDER — PALONOSETRON HCL INJECTION 0.25 MG/5ML
INTRAVENOUS | Status: AC
  Filled 2021-01-05: qty 5

## 2021-01-05 MED ORDER — HEPARIN SOD (PORK) LOCK FLUSH 100 UNIT/ML IV SOLN
500.0000 [IU] | Freq: Once | INTRAVENOUS | Status: AC | PRN
  Administered 2021-01-05: 500 [IU]
  Filled 2021-01-05: qty 5

## 2021-01-05 MED ORDER — DOXORUBICIN HCL CHEMO IV INJECTION 2 MG/ML
25.0000 mg/m2 | Freq: Once | INTRAVENOUS | Status: AC
  Administered 2021-01-05: 42 mg via INTRAVENOUS
  Filled 2021-01-05: qty 21

## 2021-01-05 MED ORDER — VINBLASTINE SULFATE CHEMO INJECTION 1 MG/ML
3.0000 mg/m2 | Freq: Once | INTRAVENOUS | Status: AC
  Administered 2021-01-05: 5.1 mg via INTRAVENOUS
  Filled 2021-01-05: qty 5.1

## 2021-01-05 MED ORDER — PALONOSETRON HCL INJECTION 0.25 MG/5ML
0.2500 mg | Freq: Once | INTRAVENOUS | Status: AC
  Administered 2021-01-05: 0.25 mg via INTRAVENOUS

## 2021-01-05 MED ORDER — SODIUM CHLORIDE 0.9 % IV SOLN
Freq: Once | INTRAVENOUS | Status: AC
Start: 2021-01-05 — End: 2021-01-05
  Filled 2021-01-05: qty 250

## 2021-01-05 MED ORDER — SODIUM CHLORIDE 0.9% FLUSH
10.0000 mL | Freq: Once | INTRAVENOUS | Status: AC
  Administered 2021-01-05: 10 mL
  Filled 2021-01-05: qty 10

## 2021-01-05 MED ORDER — DEXAMETHASONE SODIUM PHOSPHATE 10 MG/ML IJ SOLN
5.0000 mg | Freq: Once | INTRAMUSCULAR | Status: AC
  Administered 2021-01-05: 5 mg via INTRAVENOUS

## 2021-01-05 MED ORDER — SODIUM CHLORIDE 0.9% FLUSH
10.0000 mL | INTRAVENOUS | Status: DC | PRN
  Administered 2021-01-05: 10 mL
  Filled 2021-01-05: qty 10

## 2021-01-05 MED ORDER — SODIUM CHLORIDE 0.9 % IV SOLN
150.0000 mg | Freq: Once | INTRAVENOUS | Status: AC
  Administered 2021-01-05: 150 mg via INTRAVENOUS
  Filled 2021-01-05: qty 150

## 2021-01-05 MED ORDER — SODIUM CHLORIDE 0.9 % IV SOLN
375.0000 mg/m2 | Freq: Once | INTRAVENOUS | Status: AC
  Administered 2021-01-05: 630 mg via INTRAVENOUS
  Filled 2021-01-05: qty 63

## 2021-01-05 MED ORDER — DEXAMETHASONE SODIUM PHOSPHATE 10 MG/ML IJ SOLN
INTRAMUSCULAR | Status: AC
  Filled 2021-01-05: qty 1

## 2021-01-05 NOTE — Patient Instructions (Signed)
[facility] ONCOLOGY  Discharge Instructions: Thank you for choosing [facility] to provide your oncology and hematology care.   If you have a lab appointment with the [facility], please go directly to the [facility] and check in at the registration area.   Wear comfortable clothing and clothing appropriate for easy access to any Portacath or PICC line.   We strive to give you quality time with your provider. You may need to reschedule your appointment if you arrive late (15 or more minutes).  Arriving late affects you and other patients whose appointments are after yours.  Also, if you miss three or more appointments without notifying the office, you may be dismissed from the clinic at the provider's discretion.      For prescription refill requests, have your pharmacy contact our office and allow 72 hours for refills to be completed.    Today you received the following chemotherapy and/or immunotherapy agents: Doxorubicin (Adriamycin), Vinblastine (Velban), and Dacarbazine (DTIC).     To help prevent nausea and vomiting after your treatment, we encourage you to take your nausea medication as directed.  BELOW ARE SYMPTOMS THAT SHOULD BE REPORTED IMMEDIATELY: *FEVER GREATER THAN 100.4 F (38 C) OR HIGHER *CHILLS OR SWEATING *NAUSEA AND VOMITING THAT IS NOT CONTROLLED WITH YOUR NAUSEA MEDICATION *UNUSUAL SHORTNESS OF BREATH *UNUSUAL BRUISING OR BLEEDING *URINARY PROBLEMS (pain or burning when urinating, or frequent urination) *BOWEL PROBLEMS (unusual diarrhea, constipation, pain near the anus) TENDERNESS IN MOUTH AND THROAT WITH OR WITHOUT PRESENCE OF ULCERS (sore throat, sores in mouth, or a toothache) UNUSUAL RASH, SWELLING OR PAIN  UNUSUAL VAGINAL DISCHARGE OR ITCHING   Items with * indicate a potential emergency and should be followed up as soon as possible or go to the Emergency Department if any problems should occur.  Please show the  CHEMOTHERAPY ALERT CARD or IMMUNOTHERAPY ALERT CARD at check-in to the Emergency Department and triage nurse.  Should you have questions after your visit or need to cancel or reschedule your appointment, please contact [facility]  Dept: [phone]  and follow the prompts.  Office hours are 8:00 a.m. to 4:30 p.m. Monday - Friday. Please note that voicemails left after 4:00 p.m. may not be returned until the following business day.  We are closed weekends and major holidays. You have access to a nurse at all times for urgent questions. Please call the main number to the clinic Dept: [phone] and follow the prompts.   For any non-urgent questions, you may also contact your provider using MyChart. We now offer e-Visits for anyone 78 and older to request care online for non-urgent symptoms. For details visit [email or web address].   Also download the MyChart app! Go to the app store, search "MyChart", open the app, select [facility], and log in with your MyChart username and password.  Due to Covid, a mask is required upon entering the hospital/clinic. If you do not have a mask, one will be given to you upon arrival. For doctor visits, patients may have 1 support person aged 82 or older with them. For treatment visits, patients cannot have anyone with them due to current Covid guidelines and our immunocompromised population.

## 2021-01-07 ENCOUNTER — Other Ambulatory Visit: Payer: Self-pay

## 2021-01-07 ENCOUNTER — Inpatient Hospital Stay: Payer: Medicare PPO

## 2021-01-07 VITALS — BP 115/51 | HR 79 | Temp 98.9°F | Resp 16

## 2021-01-07 DIAGNOSIS — Z5112 Encounter for antineoplastic immunotherapy: Secondary | ICD-10-CM | POA: Diagnosis not present

## 2021-01-07 DIAGNOSIS — C8118 Nodular sclerosis classical Hodgkin lymphoma, lymph nodes of multiple sites: Secondary | ICD-10-CM

## 2021-01-07 MED ORDER — PEGFILGRASTIM-JMDB 6 MG/0.6ML ~~LOC~~ SOSY
PREFILLED_SYRINGE | SUBCUTANEOUS | Status: AC
  Filled 2021-01-07: qty 0.6

## 2021-01-07 MED ORDER — PEGFILGRASTIM-JMDB 6 MG/0.6ML ~~LOC~~ SOSY
6.0000 mg | PREFILLED_SYRINGE | Freq: Once | SUBCUTANEOUS | Status: AC
  Administered 2021-01-07: 6 mg via SUBCUTANEOUS

## 2021-01-07 NOTE — Patient Instructions (Signed)

## 2021-01-08 ENCOUNTER — Ambulatory Visit (HOSPITAL_COMMUNITY): Payer: Medicare PPO | Attending: Cardiovascular Disease

## 2021-01-08 DIAGNOSIS — C8118 Nodular sclerosis classical Hodgkin lymphoma, lymph nodes of multiple sites: Secondary | ICD-10-CM | POA: Diagnosis not present

## 2021-01-08 DIAGNOSIS — R0609 Other forms of dyspnea: Secondary | ICD-10-CM | POA: Diagnosis not present

## 2021-01-08 DIAGNOSIS — Z5111 Encounter for antineoplastic chemotherapy: Secondary | ICD-10-CM

## 2021-01-08 DIAGNOSIS — Z0189 Encounter for other specified special examinations: Secondary | ICD-10-CM

## 2021-01-08 LAB — ECHOCARDIOGRAM COMPLETE
AR max vel: 2.48 cm2
AV Area VTI: 2.21 cm2
AV Area mean vel: 2.36 cm2
AV Mean grad: 7.5 mmHg
AV Peak grad: 14.3 mmHg
Ao pk vel: 1.89 m/s
Area-P 1/2: 4.49 cm2
S' Lateral: 2.8 cm

## 2021-01-08 MED ORDER — PERFLUTREN LIPID MICROSPHERE
1.0000 mL | INTRAVENOUS | Status: AC | PRN
  Administered 2021-01-08: 1 mL via INTRAVENOUS

## 2021-01-11 ENCOUNTER — Ambulatory Visit: Payer: Medicare PPO | Admitting: Hematology and Oncology

## 2021-01-18 ENCOUNTER — Inpatient Hospital Stay: Payer: Medicare PPO

## 2021-01-18 ENCOUNTER — Other Ambulatory Visit: Payer: Self-pay

## 2021-01-18 ENCOUNTER — Inpatient Hospital Stay: Payer: Medicare PPO | Admitting: Hematology and Oncology

## 2021-01-18 ENCOUNTER — Encounter: Payer: Self-pay | Admitting: Hematology and Oncology

## 2021-01-18 ENCOUNTER — Inpatient Hospital Stay: Payer: Medicare PPO | Attending: Hematology and Oncology

## 2021-01-18 DIAGNOSIS — G620 Drug-induced polyneuropathy: Secondary | ICD-10-CM | POA: Diagnosis not present

## 2021-01-18 DIAGNOSIS — D6481 Anemia due to antineoplastic chemotherapy: Secondary | ICD-10-CM | POA: Diagnosis not present

## 2021-01-18 DIAGNOSIS — C8198 Hodgkin lymphoma, unspecified, lymph nodes of multiple sites: Secondary | ICD-10-CM | POA: Insufficient documentation

## 2021-01-18 DIAGNOSIS — T451X5A Adverse effect of antineoplastic and immunosuppressive drugs, initial encounter: Secondary | ICD-10-CM | POA: Diagnosis not present

## 2021-01-18 DIAGNOSIS — G609 Hereditary and idiopathic neuropathy, unspecified: Secondary | ICD-10-CM

## 2021-01-18 DIAGNOSIS — Z5111 Encounter for antineoplastic chemotherapy: Secondary | ICD-10-CM | POA: Insufficient documentation

## 2021-01-18 DIAGNOSIS — C8118 Nodular sclerosis classical Hodgkin lymphoma, lymph nodes of multiple sites: Secondary | ICD-10-CM

## 2021-01-18 DIAGNOSIS — Z79899 Other long term (current) drug therapy: Secondary | ICD-10-CM | POA: Insufficient documentation

## 2021-01-18 LAB — CMP (CANCER CENTER ONLY)
ALT: 17 U/L (ref 0–44)
AST: 23 U/L (ref 15–41)
Albumin: 4.1 g/dL (ref 3.5–5.0)
Alkaline Phosphatase: 97 U/L (ref 38–126)
Anion gap: 11 (ref 5–15)
BUN: 12 mg/dL (ref 8–23)
CO2: 24 mmol/L (ref 22–32)
Calcium: 9.6 mg/dL (ref 8.9–10.3)
Chloride: 107 mmol/L (ref 98–111)
Creatinine: 0.79 mg/dL (ref 0.44–1.00)
GFR, Estimated: >60 mL/min (ref >60)
Glucose, Bld: 125 mg/dL — ABNORMAL HIGH (ref 70–99)
Potassium: 3.9 mmol/L (ref 3.5–5.1)
Sodium: 142 mmol/L (ref 135–145)
Total Bilirubin: 0.3 mg/dL (ref 0.3–1.2)
Total Protein: 6.8 g/dL (ref 6.5–8.1)

## 2021-01-18 LAB — CBC WITH DIFFERENTIAL (CANCER CENTER ONLY)
Abs Immature Granulocytes: 0.67 10*3/uL — ABNORMAL HIGH (ref 0.00–0.07)
Basophils Absolute: 0.1 10*3/uL (ref 0.0–0.1)
Basophils Relative: 1 %
Eosinophils Absolute: 0.4 10*3/uL (ref 0.0–0.5)
Eosinophils Relative: 2 %
HCT: 35.5 % — ABNORMAL LOW (ref 36.0–46.0)
Hemoglobin: 11.7 g/dL — ABNORMAL LOW (ref 12.0–15.0)
Immature Granulocytes: 4 %
Lymphocytes Relative: 8 %
Lymphs Abs: 1.2 10*3/uL (ref 0.7–4.0)
MCH: 32 pg (ref 26.0–34.0)
MCHC: 33 g/dL (ref 30.0–36.0)
MCV: 97 fL (ref 80.0–100.0)
Monocytes Absolute: 1.3 10*3/uL — ABNORMAL HIGH (ref 0.1–1.0)
Monocytes Relative: 9 %
Neutro Abs: 11.6 10*3/uL — ABNORMAL HIGH (ref 1.7–7.7)
Neutrophils Relative %: 76 %
Platelet Count: 244 10*3/uL (ref 150–400)
RBC: 3.66 MIL/uL — ABNORMAL LOW (ref 3.87–5.11)
RDW: 14.7 % (ref 11.5–15.5)
WBC Count: 15.2 10*3/uL — ABNORMAL HIGH (ref 4.0–10.5)
nRBC: 0 % (ref 0.0–0.2)

## 2021-01-18 MED ORDER — DEXAMETHASONE SODIUM PHOSPHATE 10 MG/ML IJ SOLN
5.0000 mg | Freq: Once | INTRAMUSCULAR | Status: AC
  Administered 2021-01-18: 5 mg via INTRAVENOUS

## 2021-01-18 MED ORDER — SODIUM CHLORIDE 0.9 % IV SOLN
Freq: Once | INTRAVENOUS | Status: AC
Start: 2021-01-18 — End: 2021-01-18
  Filled 2021-01-18: qty 250

## 2021-01-18 MED ORDER — SODIUM CHLORIDE 0.9 % IV SOLN
150.0000 mg | Freq: Once | INTRAVENOUS | Status: AC
  Administered 2021-01-18: 150 mg via INTRAVENOUS
  Filled 2021-01-18: qty 150

## 2021-01-18 MED ORDER — SODIUM CHLORIDE 0.9% FLUSH
10.0000 mL | INTRAVENOUS | Status: DC | PRN
  Administered 2021-01-18: 10 mL
  Filled 2021-01-18: qty 10

## 2021-01-18 MED ORDER — PALONOSETRON HCL INJECTION 0.25 MG/5ML
0.2500 mg | Freq: Once | INTRAVENOUS | Status: AC
  Administered 2021-01-18: 0.25 mg via INTRAVENOUS

## 2021-01-18 MED ORDER — DOXORUBICIN HCL CHEMO IV INJECTION 2 MG/ML
25.0000 mg/m2 | Freq: Once | INTRAVENOUS | Status: AC
  Administered 2021-01-18: 42 mg via INTRAVENOUS
  Filled 2021-01-18: qty 21

## 2021-01-18 MED ORDER — DEXAMETHASONE SODIUM PHOSPHATE 10 MG/ML IJ SOLN
INTRAMUSCULAR | Status: AC
  Filled 2021-01-18: qty 1

## 2021-01-18 MED ORDER — VINBLASTINE SULFATE CHEMO INJECTION 1 MG/ML
3.0000 mg/m2 | Freq: Once | INTRAVENOUS | Status: AC
  Administered 2021-01-18: 5.1 mg via INTRAVENOUS
  Filled 2021-01-18: qty 5.1

## 2021-01-18 MED ORDER — PALONOSETRON HCL INJECTION 0.25 MG/5ML
INTRAVENOUS | Status: AC
  Filled 2021-01-18: qty 5

## 2021-01-18 MED ORDER — SODIUM CHLORIDE 0.9 % IV SOLN
375.0000 mg/m2 | Freq: Once | INTRAVENOUS | Status: AC
  Administered 2021-01-18: 630 mg via INTRAVENOUS
  Filled 2021-01-18: qty 63

## 2021-01-18 MED ORDER — HEPARIN SOD (PORK) LOCK FLUSH 100 UNIT/ML IV SOLN
500.0000 [IU] | Freq: Once | INTRAVENOUS | Status: AC | PRN
  Administered 2021-01-18: 500 [IU]
  Filled 2021-01-18: qty 5

## 2021-01-18 MED ORDER — SODIUM CHLORIDE 0.9% FLUSH
10.0000 mL | Freq: Once | INTRAVENOUS | Status: AC
  Administered 2021-01-18: 10 mL
  Filled 2021-01-18: qty 10

## 2021-01-18 NOTE — Assessment & Plan Note (Signed)

## 2021-01-18 NOTE — Assessment & Plan Note (Signed)
She tolerated last 2 cycles of treatment very well We discussed the risk and benefits of discontinuation of prophylactic GCF support and she is willing to try I will cancel her injection appointment and the order for this coming Wednesday We will see how her blood count looks like in 2 weeks We will order imaging study after cycle 6 of therapy

## 2021-01-18 NOTE — Progress Notes (Signed)
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 The patient denies recent history of bleeding such as epistaxis, hematuria or hematochezia. She is asymptomatic from the anemia. I will observe for now.  She does not require transfusion now. I will continue the chemotherapy at current dose without dosage adjustment.  If the anemia gets progressive worse in the future, I might have to delay her treatment or adjust the chemotherapy dose.   Peripheral neuropathy She has significant peripheral neuropathy She tolerated gabapentin well We will continue the same  No orders of the defined types were placed in this encounter.   All questions were answered. The patient knows to call the clinic with any problems, questions or concerns. The total time spent in the appointment was 20 minutes encounter with patients including review of chart and various tests results, discussions about plan of care and coordination of care plan   Heath Lark, MD 01/18/2021 4:12 PM  INTERVAL HISTORY: Please see below for problem oriented charting. 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 Extensive hypermetabolic lymphadenopathy involving the neck, chest and abdomen as detailed above (Deauville 5). I do not see any pelvic disease. 2. Possible involvement of the spleen. 3. Osseous involvement is also noted.     09/03/2020 Pathology Results   A. LYMPH NODE, LEFT NECK, EXCISION:  -Lymphoproliferative disorder consistent with classical Hodgkin lymphoma -See comment   COMMENT:   The sections show effacement of the lymph nodal architecture by a primarily nodular lymphoproliferative process.  In the more cellular areas, the nodules are composed of a mixture of small lymphocytes, plasma cells, abundant histiocytes in addition to atypical large mononuclear and multi-lobated lymphoid appearing cells with variably prominent nucleoli.  The nodules are surrounded by dense collagenous fibrosis.  In the less cellular areas, there appears to be a fibroblastic proliferation and a much more depleted cellular appearance but scattering of large atypical lymphoid appearing cells is still seen.   Flow cytometric analysis was performed [facility] [phone]) and shows predominance of T lymphocytes with nonspecific changes including relative abundance of CD8 positive cells and reversal of the CD4: CD8 ratio.  No monoclonal B-cell population identified.  A battery of  immunohistochemical stains was performed and shows that the large atypical lymphoid appearing cells are positive for  CD30, Mum-1, PAX 5 (weak), CD23 in addition to variable but generally weak positivity for CD79a and CD20.  Only scattered large atypical lymphoid cells show weak Golgi positivity for CD15.  The large atypical lymphoid cells are negative for LCA, CD10, CD3, CD5, EMA, ALK protein and EBV in situ  hybridization.  The small lymphoid cells in the background show a mixture of T and B cells with predominance of T cells.  T cells show a mixture of CD4 and CD8 positive cells with slight predominance of the latter.  The overall findings are most consistent with classical Hodgkin lymphoma, nodular sclerosis subtype.      09/21/2020 Procedure   Placement of a subcutaneous port device. Catheter tip at the superior cavoatrial junction.   09/25/2020 Echocardiogram   1. Left ventricular ejection fraction, by estimation, is 60 to 65%. The left ventricle has normal function. The left ventricle has no regional wall motion abnormalities. There is mild concentric left ventricular hypertrophy. Left ventricular diastolic parameters are consistent with Grade I diastolic dysfunction (impaired relaxation). The average left ventricular global longitudinal strain is -19.6 %. The global longitudinal strain is normal.  2. Right ventricular systolic function is normal. The right ventricular size is normal.  3. A small pericardial effusion is present. The pericardial effusion is surrounding the apex and anterior to the right ventricle. There is no evidence of cardiac tamponade.  4. The mitral valve is normal in structure. No evidence of mitral valve regurgitation.  5. The aortic valve is tricuspid. Aortic valve regurgitation is not visualized. Mild aortic valve sclerosis is present, with no evidence of aortic valve stenosis.  6. The inferior vena cava is normal in size with greater than 50% respiratory variability, suggesting right atrial pressure of 3 mmHg.    Chemotherapy    Patient is on Treatment Plan: HODGKINS LYMPHOMA  A + AVD  Q28D       11/23/2020 PET scan   1. Interval significant response to therapy. The previously demonstrated hypermetabolic adenopathy in the neck, chest and abdomen has nearly completely resolved. Deauville 1 and 2. Slightly greater residual metabolic activity within hilar lymph nodes is less specific and may be reactive. 2. Resolution of previously demonstrated focal hypermetabolic activity in the spleen and bones. Diffuse bone marrow activity attributed to treatment response. 3. Stable incidental findings as detailed above.   01/11/2021 Echocardiogram    1. Stable EF and GLS compared to 09/25/20. Left ventricular ejection fraction, by estimation, is 60 to 65%. The left ventricle has normal function. The left ventricle has no regional wall motion abnormalities. Left ventricular diastolic parameters are consistent with Grade I diastolic dysfunction (impaired relaxation). The average left ventricular global longitudinal strain is -19.7 %. The global longitudinal strain is normal.  2. Right ventricular systolic function is normal. The right ventricular size is normal.  3. Left atrial size was mildly dilated.  4. The mitral valve is abnormal. Trivial mitral valve regurgitation. No evidence of mitral stenosis. Moderate mitral annular calcification.  5. The aortic valve is calcified. There is moderate calcification of the aortic valve. Aortic valve regurgitation is not visualized. Mild to moderate aortic valve sclerosis/calcification is present, without any evidence of aortic stenosis.  6. The inferior vena cava is normal in size with greater than 50% respiratory variability, suggesting right atrial pressure of 3 mmHg.     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[facility])  09/14/2020 Initial  Diagnosis   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[facility])    09/29/2020 -  Chemotherapy    Patient is on Treatment Plan: HODGKINS LYMPHOMA  A + AVD Q28D         REVIEW OF SYSTEMS:   Constitutional: Denies fevers, chills  or abnormal weight loss Eyes: Denies blurriness of vision Ears, nose, mouth, throat, and face: Denies mucositis or sore throat Respiratory: Denies cough, dyspnea or wheezes Cardiovascular: Denies palpitation, chest discomfort or lower extremity swelling Gastrointestinal:  Denies nausea, heartburn or change in bowel habits Skin: Denies abnormal skin rashes Lymphatics: Denies new lymphadenopathy or easy bruising Behavioral/Psych: Mood is stable, no new changes  All other systems were reviewed with the patient and are negative.  I have reviewed the past medical history, past surgical history, social history and family history with the patient and they are unchanged from previous note.  ALLERGIES:  is allergic to codeine.  MEDICATIONS:  Current Outpatient Medications  Medication Sig Dispense Refill   Acetaminophen (TYLENOL) 325 MG CAPS Take by mouth.     acyclovir (ZOVIRAX) 400 MG tablet Take 1 tablet (400 mg total) by mouth 2 (two) times daily. 60 tablet 6   albuterol (VENTOLIN HFA) 108 (90 Base) MCG/ACT inhaler Inhale 2 puffs into the lungs every 6 (six) hours as needed for wheezing or shortness of breath. 8 g 0   aspirin EC 81 MG tablet Take 81 mg by mouth daily.     atorvastatin (LIPITOR) 20 MG tablet TAKE 1 TABLET BY MOUTH ONCE DAILY 90 tablet 3   BREO ELLIPTA 100-25 MCG/INH AEPB INHALE 1 PUFF INTO THE LUNGS DAILY 60 each 5   cetirizine (ZYRTEC) 10 MG tablet Take 1 tablet (10 mg total) by mouth daily. 90 tablet 2   denosumab (PROLIA) 60 MG/ML SOSY injection Inject 60 mg into the skin every 6 (six) months.     gabapentin (NEURONTIN) 300 MG capsule Take 1 capsule (300 mg total) by mouth 2 (two) times daily. 60 capsule 1   glucose blood (ACCU-CHEK AVIVA PLUS) test strip Use to Monitor blood sugars daily 100 each 5   hydrochlorothiazide (MICROZIDE) 12.5 MG capsule Take 1 capsule (12.5 mg total) by mouth daily. 90 capsule 3   lidocaine-prilocaine (EMLA) cream Apply to affected area once 30 g  3   losartan (COZAAR) 100 MG tablet Take 1 tablet (100 mg total) by mouth daily. 90 tablet 2   metoprolol succinate (TOPROL-XL) 50 MG 24 hr tablet Take with or immediately following a meal. 90 tablet 2   Multiple Vitamin (MULTIVITAMIN) capsule Take 1 capsule by mouth daily.     omeprazole (PRILOSEC) 20 MG capsule TAKE 1 CAPSULE BY MOUTH ONCE DAILY 90 capsule 3   ondansetron (ZOFRAN) 8 MG tablet Take 1 tablet (8 mg total) by mouth every 8 (eight) hours as needed. 30 tablet 1   prochlorperazine (COMPAZINE) 10 MG tablet Take 1 tablet (10 mg total) by mouth every 6 (six) hours as needed (Nausea or vomiting). 30 tablet 1   Current Facility-Administered Medications  Medication Dose Route Frequency Provider Last Rate Last Admin   denosumab (PROLIA) injection 60 mg  60 mg Subcutaneous Q6 months [place], [place] F, MD   60 mg at 08/10/20 1000   Facility-Administered Medications Ordered in Other Visits  Medication Dose Route Frequency Provider Last Rate Last Admin   sodium chloride flush (NS) 0.9 % injection 10 mL  10 mL Intracatheter PRN Artis Delay, MD   10 mL at 01/18/21 1136  PHYSICAL EXAMINATION: ECOG PERFORMANCE STATUS: 1 - Symptomatic but completely ambulatory  Vitals:   01/18/21 0802  BP: 123/64  Pulse: 84  Resp: 18  Temp: (!) 97.4 F (36.3 C)  SpO2: 100%   Filed Weights   01/18/21 0802  Weight: 140 lb 3.2 oz (63.6 kg)    GENERAL:alert, no distress and comfortable SKIN: skin color, texture, turgor are normal, no rashes or significant lesions EYES: normal, Conjunctiva are pink and non-injected, sclera clear OROPHARYNX:no exudate, no erythema and lips, buccal mucosa, and tongue normal  NECK: supple, thyroid normal size, non-tender, without nodularity LYMPH:  no palpable lymphadenopathy in the cervical, axillary or inguinal LUNGS: clear to auscultation and percussion with normal breathing effort HEART: regular rate & rhythm and no murmurs and no lower extremity  edema ABDOMEN:abdomen soft, non-tender and normal bowel sounds Musculoskeletal:no cyanosis of digits and no clubbing  NEURO: alert & oriented x 3 with fluent speech, no focal motor/sensory deficits  LABORATORY DATA:  I have reviewed the data as listed    Component Value Date/Time   NA 142 01/18/2021 0750   NA 143 08/15/2016 1037   K 3.9 01/18/2021 0750   K 4.8 08/15/2016 1037   CL 107 01/18/2021 0750   CL 103 07/16/2012 1304   CO2 24 01/18/2021 0750   CO2 27 08/15/2016 1037   GLUCOSE 125 (H) 01/18/2021 0750   GLUCOSE 111 08/15/2016 1037   GLUCOSE 112 (H) 07/16/2012 1304   BUN 12 01/18/2021 0750   BUN 12.7 08/15/2016 1037   CREATININE 0.79 01/18/2021 0750   CREATININE 0.95 (H) 08/10/2020 1041   CREATININE 0.9 08/15/2016 1037   CALCIUM 9.6 01/18/2021 0750   CALCIUM 10.3 08/13/2020 1117   CALCIUM 10.5 (H) 08/15/2016 1037   PROT 6.8 01/18/2021 0750   PROT 7.9 08/15/2016 1037   ALBUMIN 4.1 01/18/2021 0750   ALBUMIN 4.5 08/15/2016 1037   AST 23 01/18/2021 0750   AST 30 08/15/2016 1037   ALT 17 01/18/2021 0750   ALT 29 08/15/2016 1037   ALKPHOS 97 01/18/2021 0750   ALKPHOS 67 08/15/2016 1037   BILITOT 0.3 01/18/2021 0750   BILITOT 0.80 08/15/2016 1037   GFRNONAA >60 01/18/2021 0750   GFRNONAA 62 12/05/2017 1000   GFRAA 72 12/05/2017 1000    No results found for: SPEP, UPEP  Lab Results  Component Value Date   WBC 15.2 (H) 01/18/2021   NEUTROABS 11.6 (H) 01/18/2021   HGB 11.7 (L) 01/18/2021   HCT 35.5 (L) 01/18/2021   MCV 97.0 01/18/2021   PLT 244 01/18/2021      Chemistry      Component Value Date/Time   NA 142 01/18/2021 0750   NA 143 08/15/2016 1037   K 3.9 01/18/2021 0750   K 4.8 08/15/2016 1037   CL 107 01/18/2021 0750   CL 103 07/16/2012 1304   CO2 24 01/18/2021 0750   CO2 27 08/15/2016 1037   BUN 12 01/18/2021 0750   BUN 12.7 08/15/2016 1037   CREATININE 0.79 01/18/2021 0750   CREATININE 0.95 (H) 08/10/2020 1041   CREATININE 0.9 08/15/2016 1037       Component Value Date/Time   CALCIUM 9.6 01/18/2021 0750   CALCIUM 10.3 08/13/2020 1117   CALCIUM 10.5 (H) 08/15/2016 1037   ALKPHOS 97 01/18/2021 0750   ALKPHOS 67 08/15/2016 1037   AST 23 01/18/2021 0750   AST 30 08/15/2016 1037   ALT 17 01/18/2021 0750   ALT 29 08/15/2016 1037   BILITOT 0.3  01/18/2021 0750   BILITOT 0.80 08/15/2016 1037       RADIOGRAPHIC STUDIES: I have personally reviewed the radiological images as listed and agreed with the findings in the report. ECHOCARDIOGRAM COMPLETE  Result Date: 01/08/2021    ECHOCARDIOGRAM REPORT   Patient Name:   JERAH ESTY [facility] Date of Exam: 01/08/2021 Medical Rec #:  [id number]          Height:       63.0 in Accession #:    [id number]         Weight:       141.2 lb Date of Birth:  12/15/1945           BSA:          1.668 m Patient Age:    12 years           BP:           124/58 mmHg Patient Gender: F                  HR:           83 bpm. Exam Location:  [address] Procedure: 2D Echo, Cardiac Doppler, Color Doppler and Intracardiac            Opacification Agent Indications:    Z09 Chemo  History:        Patient has prior history of Echocardiogram examinations, most                 recent 09/25/2020. Risk Factors:Hypertension, Dyslipidemia and                 Pre-diabetes.  Sonographer:    Cresenciano Lick RDCS Referring Phys: [id number] Kailen Hinkle [place]  1. Stable EF and GLS compared to 09/25/20. Left ventricular ejection fraction, by estimation, is 60 to 65%. The left ventricle has normal function. The left ventricle has no regional wall motion abnormalities. Left ventricular diastolic parameters are consistent with Grade I diastolic dysfunction (impaired relaxation). The average left ventricular global longitudinal strain is -19.7 %. The global longitudinal strain is normal.  2. Right ventricular systolic function is normal. The right ventricular size is normal.  3. Left atrial size was mildly dilated.  4. The mitral valve is  abnormal. Trivial mitral valve regurgitation. No evidence of mitral stenosis. Moderate mitral annular calcification.  5. The aortic valve is calcified. There is moderate calcification of the aortic valve. Aortic valve regurgitation is not visualized. Mild to moderate aortic valve sclerosis/calcification is present, without any evidence of aortic stenosis.  6. The inferior vena cava is normal in size with greater than 50% respiratory variability, suggesting right atrial pressure of 3 mmHg. FINDINGS  Left Ventricle: Stable EF and GLS compared to 09/25/20. Left ventricular ejection fraction, by estimation, is 60 to 65%. The left ventricle has normal function. The left ventricle has no regional wall motion abnormalities. Definity contrast agent was given IV to delineate the left ventricular endocardial borders. The average left ventricular global longitudinal strain is -19.7 %. The global longitudinal strain is normal. The left ventricular internal cavity size was normal in size. There is no left ventricular hypertrophy. Left ventricular diastolic parameters are consistent with Grade I diastolic dysfunction (impaired relaxation). Right Ventricle: The right ventricular size is normal. No increase in right ventricular wall thickness. Right ventricular systolic function is normal. Left Atrium: Left atrial size was mildly dilated. Right Atrium: Right atrial size was normal in size. Pericardium: There is no evidence of pericardial effusion.  Mitral Valve: The mitral valve is abnormal. There is mild thickening of the mitral valve leaflet(s). There is mild calcification of the mitral valve leaflet(s). Moderate mitral annular calcification. Trivial mitral valve regurgitation. No evidence of mitral valve stenosis. Tricuspid Valve: The tricuspid valve is normal in structure. Tricuspid valve regurgitation is not demonstrated. No evidence of tricuspid stenosis. Aortic Valve: The aortic valve is calcified. There is moderate  calcification of the aortic valve. Aortic valve regurgitation is not visualized. Mild to moderate aortic valve sclerosis/calcification is present, without any evidence of aortic stenosis. Aortic valve mean gradient measures 7.5 mmHg. Aortic valve peak gradient measures 14.3 mmHg. Aortic valve area, by VTI measures 2.21 cm. Pulmonic Valve: The pulmonic valve was normal in structure. Pulmonic valve regurgitation is not visualized. No evidence of pulmonic stenosis. Aorta: The aortic root is normal in size and structure. Venous: The inferior vena cava is normal in size with greater than 50% respiratory variability, suggesting right atrial pressure of 3 mmHg. IAS/Shunts: No atrial level shunt detected by color flow Doppler.  LEFT VENTRICLE PLAX 2D LVIDd:         4.00 cm  Diastology LVIDs:         2.80 cm  LV e' medial:    7.09 cm/s LV PW:         0.90 cm  LV E/e' medial:  8.9 LV IVS:        0.90 cm  LV e' lateral:   7.09 cm/s LVOT diam:     2.00 cm  LV E/e' lateral: 8.9 LV SV:         81 LV SV Index:   49       2D Longitudinal Strain LVOT Area:     3.14 cm 2D Strain GLS (A2C):   -19.0 %                         2D Strain GLS (A3C):   -19.0 %                         2D Strain GLS (A4C):   -21.1 %                         2D Strain GLS Avg:     -19.7 %                          3D Volume EF:                         3D EF:        56 %                         LV EDV:       107 ml                         LV ESV:       46 ml                         LV SV:        60 ml RIGHT VENTRICLE RV Basal diam:  3.30 cm RV S prime:     14.00 cm/s TAPSE (M-mode): 2.2 cm LEFT ATRIUM  Index       RIGHT ATRIUM          Index LA diam:        3.80 cm 2.28 cm/m  RA Area:     8.49 cm LA Vol (A2C):   34.5 ml 20.68 ml/m RA Volume:   16.30 ml 9.77 ml/m LA Vol (A4C):   25.5 ml 15.29 ml/m LA Biplane Vol: 30.0 ml 17.99 ml/m  AORTIC VALVE AV Area (Vmax):    2.48 cm AV Area (Vmean):   2.36 cm AV Area (VTI):     2.21 cm AV Vmax:            189.00 cm/s AV Vmean:          130.000 cm/s AV VTI:            0.368 m AV Peak Grad:      14.3 mmHg AV Mean Grad:      7.5 mmHg LVOT Vmax:         149.00 cm/s LVOT Vmean:        97.550 cm/s LVOT VTI:          0.258 m LVOT/AV VTI ratio: 0.70  AORTA Ao Root diam: 3.00 cm MITRAL VALVE MV Area (PHT): 4.49 cm    SHUNTS MV Decel Time: 169 msec    Systemic VTI:  0.26 m MV E velocity: 62.90 cm/s  Systemic Diam: 2.00 cm MV A velocity: 99.00 cm/s MV E/A ratio:  0.64 Jenkins Rouge MD Electronically signed by Jenkins Rouge MD Signature Date/Time: 01/08/2021/4:19:19 PM    Final

## 2021-01-18 NOTE — Patient Instructions (Signed)
[facility]  Discharge Instructions: Thank you for choosing [facility] to provide your oncology and hematology care.   If you have a lab appointment with the [facility], please go directly to the [facility] and check in at the registration area.   Wear comfortable clothing and clothing appropriate for easy access to any Portacath or PICC line.   We strive to give you quality time with your provider. You may need to reschedule your appointment if you arrive late (15 or more minutes).  Arriving late affects you and other patients whose appointments are after yours.  Also, if you miss three or more appointments without notifying the office, you may be dismissed from the clinic at the provider's discretion.      For prescription refill requests, have your pharmacy contact our office and allow 72 hours for refills to be completed.    Today you received the following chemotherapy and/or immunotherapy agents Doxorubicin, Vinblastine, and Dacarbazine.      To help prevent nausea and vomiting after your treatment, we encourage you to take your nausea medication as directed.  BELOW ARE SYMPTOMS THAT SHOULD BE REPORTED IMMEDIATELY: *FEVER GREATER THAN 100.4 F (38 C) OR HIGHER *CHILLS OR SWEATING *NAUSEA AND VOMITING THAT IS NOT CONTROLLED WITH YOUR NAUSEA MEDICATION *UNUSUAL SHORTNESS OF BREATH *UNUSUAL BRUISING OR BLEEDING *URINARY PROBLEMS (pain or burning when urinating, or frequent urination) *BOWEL PROBLEMS (unusual diarrhea, constipation, pain near the anus) TENDERNESS IN MOUTH AND THROAT WITH OR WITHOUT PRESENCE OF ULCERS (sore throat, sores in mouth, or a toothache) UNUSUAL RASH, SWELLING OR PAIN  UNUSUAL VAGINAL DISCHARGE OR ITCHING   Items with * indicate a potential emergency and should be followed up as soon as possible or go to the Emergency Department if any problems should occur.  Please show the CHEMOTHERAPY ALERT CARD or  IMMUNOTHERAPY ALERT CARD at check-in to the Emergency Department and triage nurse.  Should you have questions after your visit or need to cancel or reschedule your appointment, please contact [facility]  Dept: [phone]  and follow the prompts.  Office hours are 8:00 a.m. to 4:30 p.m. Monday - Friday. Please note that voicemails left after 4:00 p.m. may not be returned until the following business day.  We are closed weekends and major holidays. You have access to a nurse at all times for urgent questions. Please call the main number to the clinic Dept: [phone] and follow the prompts.   For any non-urgent questions, you may also contact your provider using MyChart. We now offer e-Visits for anyone 27 and older to request care online for non-urgent symptoms. For details visit [email or web address].   Also download the MyChart app! Go to the app store, search "MyChart", open the app, select [facility], and log in with your MyChart username and password.  Due to Covid, a mask is required upon entering the hospital/clinic. If you do not have a mask, one will be given to you upon arrival. For doctor visits, patients may have 1 support person aged 65 or older with them. For treatment visits, patients cannot have anyone with them due to current Covid guidelines and our immunocompromised population.

## 2021-01-18 NOTE — Assessment & Plan Note (Signed)
She has significant peripheral neuropathy She tolerated gabapentin well We will continue the same

## 2021-01-20 ENCOUNTER — Ambulatory Visit: Payer: Medicare PPO

## 2021-01-20 ENCOUNTER — Other Ambulatory Visit: Payer: Medicare PPO

## 2021-01-25 ENCOUNTER — Telehealth: Payer: Self-pay

## 2021-01-25 NOTE — Telephone Encounter (Signed)
Returned her call. She broke a back tooth almost completely off over the weekend. She has a dentist appt tomorrow to evaluate. Given fax # of office.

## 2021-01-25 NOTE — Telephone Encounter (Signed)
Thanks for the update

## 2021-01-26 ENCOUNTER — Telehealth: Payer: Self-pay

## 2021-01-26 NOTE — Telephone Encounter (Signed)
[name] called and left a message for Hassan Rowan, RN to let Dr. [name] know that she had a "collapsed" tooth, but she saw her dentist, Dr. Lynelle Smoke, and he was able to fill it.  The filling should be able to last until her chemo is complete in 6 weeks.  After that, she will get a crown placed.  She stated there is no need to call back, but she wanted Dr. [name] to know this information.  Gardiner Rhyme, RN

## 2021-01-28 NOTE — Telephone Encounter (Signed)
Got it.

## 2021-02-02 ENCOUNTER — Other Ambulatory Visit: Payer: Self-pay | Admitting: Hematology and Oncology

## 2021-02-02 ENCOUNTER — Inpatient Hospital Stay: Payer: Medicare PPO

## 2021-02-02 ENCOUNTER — Other Ambulatory Visit: Payer: Self-pay

## 2021-02-02 VITALS — BP 136/69 | HR 86 | Temp 98.7°F | Resp 17 | Wt 143.2 lb

## 2021-02-02 DIAGNOSIS — Z5111 Encounter for antineoplastic chemotherapy: Secondary | ICD-10-CM | POA: Diagnosis not present

## 2021-02-02 DIAGNOSIS — C8118 Nodular sclerosis classical Hodgkin lymphoma, lymph nodes of multiple sites: Secondary | ICD-10-CM

## 2021-02-02 LAB — CBC WITH DIFFERENTIAL (CANCER CENTER ONLY)
Abs Immature Granulocytes: 0 10*3/uL (ref 0.00–0.07)
Basophils Absolute: 0 10*3/uL (ref 0.0–0.1)
Basophils Relative: 1 %
Eosinophils Absolute: 0.1 10*3/uL (ref 0.0–0.5)
Eosinophils Relative: 4 %
HCT: 31.7 % — ABNORMAL LOW (ref 36.0–46.0)
Hemoglobin: 10.5 g/dL — ABNORMAL LOW (ref 12.0–15.0)
Immature Granulocytes: 0 %
Lymphocytes Relative: 18 %
Lymphs Abs: 0.6 10*3/uL — ABNORMAL LOW (ref 0.7–4.0)
MCH: 31.2 pg (ref 26.0–34.0)
MCHC: 33.1 g/dL (ref 30.0–36.0)
MCV: 94.1 fL (ref 80.0–100.0)
Monocytes Absolute: 0.6 10*3/uL (ref 0.1–1.0)
Monocytes Relative: 19 %
Neutro Abs: 1.8 10*3/uL (ref 1.7–7.7)
Neutrophils Relative %: 58 %
Platelet Count: 227 10*3/uL (ref 150–400)
RBC: 3.37 MIL/uL — ABNORMAL LOW (ref 3.87–5.11)
RDW: 13.6 % (ref 11.5–15.5)
WBC Count: 3.1 10*3/uL — ABNORMAL LOW (ref 4.0–10.5)
nRBC: 0 % (ref 0.0–0.2)

## 2021-02-02 LAB — CMP (CANCER CENTER ONLY)
ALT: 20 U/L (ref 0–44)
AST: 25 U/L (ref 15–41)
Albumin: 4.1 g/dL (ref 3.5–5.0)
Alkaline Phosphatase: 60 U/L (ref 38–126)
Anion gap: 10 (ref 5–15)
BUN: 11 mg/dL (ref 8–23)
CO2: 24 mmol/L (ref 22–32)
Calcium: 9.6 mg/dL (ref 8.9–10.3)
Chloride: 107 mmol/L (ref 98–111)
Creatinine: 0.76 mg/dL (ref 0.44–1.00)
GFR, Estimated: >60 mL/min (ref >60)
Glucose, Bld: 118 mg/dL — ABNORMAL HIGH (ref 70–99)
Potassium: 3.6 mmol/L (ref 3.5–5.1)
Sodium: 141 mmol/L (ref 135–145)
Total Bilirubin: 0.5 mg/dL (ref 0.3–1.2)
Total Protein: 6.5 g/dL (ref 6.5–8.1)

## 2021-02-02 MED ORDER — HEPARIN SOD (PORK) LOCK FLUSH 100 UNIT/ML IV SOLN
500.0000 [IU] | Freq: Once | INTRAVENOUS | Status: AC | PRN
Start: 2021-02-02 — End: 2021-02-02
  Administered 2021-02-02: 500 [IU]

## 2021-02-02 MED ORDER — DEXAMETHASONE SODIUM PHOSPHATE 10 MG/ML IJ SOLN
5.0000 mg | Freq: Once | INTRAMUSCULAR | Status: AC
  Administered 2021-02-02: 5 mg via INTRAVENOUS
  Filled 2021-02-02: qty 1

## 2021-02-02 MED ORDER — SODIUM CHLORIDE 0.9 % IV SOLN
375.0000 mg/m2 | Freq: Once | INTRAVENOUS | Status: AC
  Administered 2021-02-02: 630 mg via INTRAVENOUS
  Filled 2021-02-02: qty 63

## 2021-02-02 MED ORDER — SODIUM CHLORIDE 0.9 % IV SOLN
150.0000 mg | Freq: Once | INTRAVENOUS | Status: AC
  Administered 2021-02-02: 150 mg via INTRAVENOUS
  Filled 2021-02-02: qty 150

## 2021-02-02 MED ORDER — PALONOSETRON HCL INJECTION 0.25 MG/5ML
0.2500 mg | Freq: Once | INTRAVENOUS | Status: AC
  Administered 2021-02-02: 0.25 mg via INTRAVENOUS
  Filled 2021-02-02: qty 5

## 2021-02-02 MED ORDER — SODIUM CHLORIDE 0.9% FLUSH
10.0000 mL | Freq: Once | INTRAVENOUS | Status: AC
  Administered 2021-02-02: 10 mL

## 2021-02-02 MED ORDER — VINBLASTINE SULFATE CHEMO INJECTION 1 MG/ML
3.0000 mg/m2 | Freq: Once | INTRAVENOUS | Status: AC
  Administered 2021-02-02: 5.1 mg via INTRAVENOUS
  Filled 2021-02-02: qty 5.1

## 2021-02-02 MED ORDER — DOXORUBICIN HCL CHEMO IV INJECTION 2 MG/ML
25.0000 mg/m2 | Freq: Once | INTRAVENOUS | Status: AC
  Administered 2021-02-02: 42 mg via INTRAVENOUS
  Filled 2021-02-02: qty 21

## 2021-02-02 MED ORDER — SODIUM CHLORIDE 0.9 % IV SOLN: Freq: Once | INTRAVENOUS | Status: AC

## 2021-02-02 MED ORDER — SODIUM CHLORIDE 0.9% FLUSH
10.0000 mL | INTRAVENOUS | Status: DC | PRN
  Administered 2021-02-02: 10 mL

## 2021-02-02 NOTE — Patient Instructions (Addendum)
[facility]  Discharge Instructions: Thank you for choosing [facility] to provide your oncology and hematology care.   If you have a lab appointment with the [facility], please go directly to the [facility] and check in at the registration area.   Wear comfortable clothing and clothing appropriate for easy access to any Portacath or PICC line.   We strive to give you quality time with your provider. You may need to reschedule your appointment if you arrive late (15 or more minutes).  Arriving late affects you and other patients whose appointments are after yours.  Also, if you miss three or more appointments without notifying the office, you may be dismissed from the clinic at the provider's discretion.      For prescription refill requests, have your pharmacy contact our office and allow 72 hours for refills to be completed.    Today you received the following chemotherapy and/or immunotherapy agents: Adriamycin, Velban, Dacarbazine   To help prevent nausea and vomiting after your treatment, we encourage you to take your nausea medication as directed.  BELOW ARE SYMPTOMS THAT SHOULD BE REPORTED IMMEDIATELY: *FEVER GREATER THAN 100.4 F (38 C) OR HIGHER *CHILLS OR SWEATING *NAUSEA AND VOMITING THAT IS NOT CONTROLLED WITH YOUR NAUSEA MEDICATION *UNUSUAL SHORTNESS OF BREATH *UNUSUAL BRUISING OR BLEEDING *URINARY PROBLEMS (pain or burning when urinating, or frequent urination) *BOWEL PROBLEMS (unusual diarrhea, constipation, pain near the anus) TENDERNESS IN MOUTH AND THROAT WITH OR WITHOUT PRESENCE OF ULCERS (sore throat, sores in mouth, or a toothache) UNUSUAL RASH, SWELLING OR PAIN  UNUSUAL VAGINAL DISCHARGE OR ITCHING   Items with * indicate a potential emergency and should be followed up as soon as possible or go to the Emergency Department if any problems should occur.  Please show the CHEMOTHERAPY ALERT CARD or IMMUNOTHERAPY ALERT  CARD at check-in to the Emergency Department and triage nurse.  Should you have questions after your visit or need to cancel or reschedule your appointment, please contact [facility]  Dept: [phone]  and follow the prompts.  Office hours are 8:00 a.m. to 4:30 p.m. Monday - Friday. Please note that voicemails left after 4:00 p.m. may not be returned until the following business day.  We are closed weekends and major holidays. You have access to a nurse at all times for urgent questions. Please call the main number to the clinic Dept: [phone] and follow the prompts.   For any non-urgent questions, you may also contact your provider using MyChart. We now offer e-Visits for anyone 8 and older to request care online for non-urgent symptoms. For details visit [email or web address].   Also download the MyChart app! Go to the app store, search "MyChart", open the app, select [facility], and log in with your MyChart username and password.  Due to Covid, a mask is required upon entering the hospital/clinic. If you do not have a mask, one will be given to you upon arrival. For doctor visits, patients may have 1 support person aged 74 or older with them. For treatment visits, patients cannot have anyone with them due to current Covid guidelines and our immunocompromised population.

## 2021-02-04 ENCOUNTER — Inpatient Hospital Stay: Payer: Medicare PPO

## 2021-02-16 ENCOUNTER — Inpatient Hospital Stay: Payer: Medicare PPO | Admitting: Hematology and Oncology

## 2021-02-16 ENCOUNTER — Inpatient Hospital Stay: Payer: Medicare PPO

## 2021-02-16 ENCOUNTER — Other Ambulatory Visit: Payer: Self-pay

## 2021-02-16 ENCOUNTER — Inpatient Hospital Stay: Payer: Medicare PPO | Attending: Hematology and Oncology

## 2021-02-16 ENCOUNTER — Encounter: Payer: Self-pay | Admitting: Hematology and Oncology

## 2021-02-16 VITALS — BP 151/83 | HR 86 | Temp 99.1°F | Resp 18 | Ht 63.0 in | Wt 147.4 lb

## 2021-02-16 DIAGNOSIS — C8118 Nodular sclerosis classical Hodgkin lymphoma, lymph nodes of multiple sites: Secondary | ICD-10-CM

## 2021-02-16 DIAGNOSIS — Z79899 Other long term (current) drug therapy: Secondary | ICD-10-CM | POA: Diagnosis not present

## 2021-02-16 DIAGNOSIS — D61818 Other pancytopenia: Secondary | ICD-10-CM

## 2021-02-16 DIAGNOSIS — G609 Hereditary and idiopathic neuropathy, unspecified: Secondary | ICD-10-CM | POA: Diagnosis not present

## 2021-02-16 DIAGNOSIS — G629 Polyneuropathy, unspecified: Secondary | ICD-10-CM | POA: Diagnosis not present

## 2021-02-16 DIAGNOSIS — Z5111 Encounter for antineoplastic chemotherapy: Secondary | ICD-10-CM | POA: Diagnosis not present

## 2021-02-16 DIAGNOSIS — C8198 Hodgkin lymphoma, unspecified, lymph nodes of multiple sites: Secondary | ICD-10-CM | POA: Insufficient documentation

## 2021-02-16 LAB — CMP (CANCER CENTER ONLY)
ALT: 14 U/L (ref 0–44)
AST: 19 U/L (ref 15–41)
Albumin: 3.8 g/dL (ref 3.5–5.0)
Alkaline Phosphatase: 55 U/L (ref 38–126)
Anion gap: 10 (ref 5–15)
BUN: 14 mg/dL (ref 8–23)
CO2: 26 mmol/L (ref 22–32)
Calcium: 9.4 mg/dL (ref 8.9–10.3)
Chloride: 107 mmol/L (ref 98–111)
Creatinine: 0.74 mg/dL (ref 0.44–1.00)
GFR, Estimated: >60 mL/min (ref >60)
Glucose, Bld: 114 mg/dL — ABNORMAL HIGH (ref 70–99)
Potassium: 4.3 mmol/L (ref 3.5–5.1)
Sodium: 143 mmol/L (ref 135–145)
Total Bilirubin: 0.5 mg/dL (ref 0.3–1.2)
Total Protein: 6.2 g/dL — ABNORMAL LOW (ref 6.5–8.1)

## 2021-02-16 LAB — CBC WITH DIFFERENTIAL (CANCER CENTER ONLY)
Abs Immature Granulocytes: 0.01 10*3/uL (ref 0.00–0.07)
Basophils Absolute: 0 10*3/uL (ref 0.0–0.1)
Basophils Relative: 2 %
Eosinophils Absolute: 0.1 10*3/uL (ref 0.0–0.5)
Eosinophils Relative: 5 %
HCT: 30.9 % — ABNORMAL LOW (ref 36.0–46.0)
Hemoglobin: 10.4 g/dL — ABNORMAL LOW (ref 12.0–15.0)
Immature Granulocytes: 0 %
Lymphocytes Relative: 20 %
Lymphs Abs: 0.5 10*3/uL — ABNORMAL LOW (ref 0.7–4.0)
MCH: 31 pg (ref 26.0–34.0)
MCHC: 33.7 g/dL (ref 30.0–36.0)
MCV: 92.2 fL (ref 80.0–100.0)
Monocytes Absolute: 0.6 10*3/uL (ref 0.1–1.0)
Monocytes Relative: 23 %
Neutro Abs: 1.4 10*3/uL — ABNORMAL LOW (ref 1.7–7.7)
Neutrophils Relative %: 50 %
Platelet Count: 171 10*3/uL (ref 150–400)
RBC: 3.35 MIL/uL — ABNORMAL LOW (ref 3.87–5.11)
RDW: 13.2 % (ref 11.5–15.5)
WBC Count: 2.7 10*3/uL — ABNORMAL LOW (ref 4.0–10.5)
nRBC: 0 % (ref 0.0–0.2)

## 2021-02-16 MED ORDER — HEPARIN SOD (PORK) LOCK FLUSH 100 UNIT/ML IV SOLN
500.0000 [IU] | Freq: Once | INTRAVENOUS | Status: AC | PRN
  Administered 2021-02-16: 500 [IU]

## 2021-02-16 MED ORDER — DOXORUBICIN HCL CHEMO IV INJECTION 2 MG/ML
25.0000 mg/m2 | Freq: Once | INTRAVENOUS | Status: AC
  Administered 2021-02-16: 42 mg via INTRAVENOUS
  Filled 2021-02-16: qty 21

## 2021-02-16 MED ORDER — PALONOSETRON HCL INJECTION 0.25 MG/5ML
0.2500 mg | Freq: Once | INTRAVENOUS | Status: AC
  Administered 2021-02-16: 0.25 mg via INTRAVENOUS
  Filled 2021-02-16: qty 5

## 2021-02-16 MED ORDER — VINBLASTINE SULFATE CHEMO INJECTION 1 MG/ML
3.0000 mg/m2 | Freq: Once | INTRAVENOUS | Status: AC
  Administered 2021-02-16: 5.1 mg via INTRAVENOUS
  Filled 2021-02-16: qty 5.1

## 2021-02-16 MED ORDER — SODIUM CHLORIDE 0.9% FLUSH
10.0000 mL | Freq: Once | INTRAVENOUS | Status: AC
  Administered 2021-02-16: 10 mL

## 2021-02-16 MED ORDER — SODIUM CHLORIDE 0.9 % IV SOLN: Freq: Once | INTRAVENOUS | Status: AC

## 2021-02-16 MED ORDER — DEXAMETHASONE SODIUM PHOSPHATE 10 MG/ML IJ SOLN
5.0000 mg | Freq: Once | INTRAMUSCULAR | Status: AC
  Administered 2021-02-16: 5 mg via INTRAVENOUS
  Filled 2021-02-16: qty 1

## 2021-02-16 MED ORDER — SODIUM CHLORIDE 0.9 % IV SOLN
375.0000 mg/m2 | Freq: Once | INTRAVENOUS | Status: AC
  Administered 2021-02-16: 630 mg via INTRAVENOUS
  Filled 2021-02-16: qty 63

## 2021-02-16 MED ORDER — SODIUM CHLORIDE 0.9 % IV SOLN
150.0000 mg | Freq: Once | INTRAVENOUS | Status: AC
  Administered 2021-02-16: 150 mg via INTRAVENOUS
  Filled 2021-02-16: qty 150

## 2021-02-16 MED ORDER — SODIUM CHLORIDE 0.9% FLUSH
10.0000 mL | INTRAVENOUS | Status: DC | PRN
Start: 2021-02-16 — End: 2021-02-16
  Administered 2021-02-16: 10 mL

## 2021-02-16 NOTE — Assessment & Plan Note (Signed)
She has significant peripheral neuropathy at baseline but not worse She tolerated gabapentin well We will continue the same

## 2021-02-16 NOTE — Assessment & Plan Note (Signed)
She tolerated last few cycles of treatment very well We discussed the risk and benefits of discontinuation of prophylactic GCF support  She is not symptomatic We will proceed with treatment without delay We will order imaging study after cycle 6 of therapy

## 2021-02-16 NOTE — Patient Instructions (Signed)
[facility]  Discharge Instructions: Thank you for choosing [facility] to provide your oncology and hematology care.   If you have a lab appointment with the [facility], please go directly to the [facility] and check in at the registration area.   Wear comfortable clothing and clothing appropriate for easy access to any Portacath or PICC line.   We strive to give you quality time with your provider. You may need to reschedule your appointment if you arrive late (15 or more minutes).  Arriving late affects you and other patients whose appointments are after yours.  Also, if you miss three or more appointments without notifying the office, you may be dismissed from the clinic at the provider's discretion.      For prescription refill requests, have your pharmacy contact our office and allow 72 hours for refills to be completed.    Today you received the following chemotherapy and/or immunotherapy agents: Adriamycin, Velban, DTIC    To help prevent nausea and vomiting after your treatment, we encourage you to take your nausea medication as directed.  BELOW ARE SYMPTOMS THAT SHOULD BE REPORTED IMMEDIATELY: *FEVER GREATER THAN 100.4 F (38 C) OR HIGHER *CHILLS OR SWEATING *NAUSEA AND VOMITING THAT IS NOT CONTROLLED WITH YOUR NAUSEA MEDICATION *UNUSUAL SHORTNESS OF BREATH *UNUSUAL BRUISING OR BLEEDING *URINARY PROBLEMS (pain or burning when urinating, or frequent urination) *BOWEL PROBLEMS (unusual diarrhea, constipation, pain near the anus) TENDERNESS IN MOUTH AND THROAT WITH OR WITHOUT PRESENCE OF ULCERS (sore throat, sores in mouth, or a toothache) UNUSUAL RASH, SWELLING OR PAIN  UNUSUAL VAGINAL DISCHARGE OR ITCHING   Items with * indicate a potential emergency and should be followed up as soon as possible or go to the Emergency Department if any problems should occur.  Please show the CHEMOTHERAPY ALERT CARD or IMMUNOTHERAPY ALERT CARD at  check-in to the Emergency Department and triage nurse.  Should you have questions after your visit or need to cancel or reschedule your appointment, please contact [facility]  Dept: [phone]  and follow the prompts.  Office hours are 8:00 a.m. to 4:30 p.m. Monday - Friday. Please note that voicemails left after 4:00 p.m. may not be returned until the following business day.  We are closed weekends and major holidays. You have access to a nurse at all times for urgent questions. Please call the main number to the clinic Dept: [phone] and follow the prompts.   For any non-urgent questions, you may also contact your provider using MyChart. We now offer e-Visits for anyone 73 and older to request care online for non-urgent symptoms. For details visit [email or web address].   Also download the MyChart app! Go to the app store, search "MyChart", open the app, select [facility], and log in with your MyChart username and password.  Due to Covid, a mask is required upon entering the hospital/clinic. If you do not have a mask, one will be given to you upon arrival. For doctor visits, patients may have 1 support person aged 23 or older with them. For treatment visits, patients cannot have anyone with them due to current Covid guidelines and our immunocompromised population.

## 2021-02-16 NOTE — Assessment & Plan Note (Signed)
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

## 2021-02-16 NOTE — Progress Notes (Signed)
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 to treatment but not symptomatic Observe only We will proceed with treatment without delay  Peripheral neuropathy She has significant peripheral neuropathy at baseline but not worse She tolerated gabapentin well We will continue the same  Orders Placed This Encounter  Procedures   NM PET Image Restage (PS) Skull Base to Thigh (F-18 FDG)    Standing Status:   Future    Standing Expiration Date:   02/16/2022    Order Specific Question:   If indicated for the ordered procedure, I authorize the administration of a radiopharmaceutical per Radiology protocol    Answer:   Yes    Order Specific Question:   Preferred imaging location?    Answer:   [facility]    Order Specific Question:   Radiology Contrast Protocol - do NOT remove file path    Answer:   [email or web address]    All questions were answered. The patient knows to call the clinic with any problems, questions or concerns. The total time spent in the appointment was 20 minutes encounter with patients including review of chart and various tests results, discussions about plan of care and coordination of care plan   Heath Lark, MD 02/16/2021 10:41 AM  INTERVAL HISTORY: Please see below for problem oriented charting. she returns for treatment  follow-up for cycle 6 of treatment for Hodgkin lymphoma She tolerated last cycle well Denies nausea or changes in bowel habits No worsening peripheral neuropathy She had a minor sore in her nasal passage but not worse  REVIEW OF SYSTEMS:   Constitutional: Denies fevers, chills or abnormal weight loss Eyes: Denies blurriness of vision Ears, nose, mouth, throat, and face: Denies mucositis or sore throat Respiratory: Denies cough, dyspnea or wheezes Cardiovascular: Denies palpitation, chest discomfort or lower extremity swelling Gastrointestinal:  Denies nausea, heartburn or change in bowel habits Skin: Denies abnormal skin rashes Lymphatics: Denies new lymphadenopathy or easy bruising Behavioral/Psych: Mood is stable, no new changes  All other systems were reviewed with the patient and are negative.  I have reviewed the past medical history, past surgical history, social history and family history with the patient and they are unchanged from previous note.  ALLERGIES:  is allergic to codeine.  MEDICATIONS:  Current Outpatient Medications  Medication Sig Dispense Refill   Acetaminophen (TYLENOL) 325 MG CAPS Take by mouth.     acyclovir (ZOVIRAX) 400 MG tablet Take 1 tablet (400 mg total) by mouth 2 (two) times daily. 60 tablet 6   albuterol (VENTOLIN HFA) 108 (90 Base) MCG/ACT inhaler Inhale 2 puffs into the lungs every 6 (six) hours as needed for wheezing or shortness of breath. 8 g 0   aspirin EC 81 MG tablet Take 81 mg by mouth daily.     atorvastatin (LIPITOR) 20 MG tablet TAKE 1 TABLET BY MOUTH ONCE DAILY 90 tablet 3   BREO ELLIPTA 100-25 MCG/INH AEPB INHALE 1  PUFF INTO THE LUNGS DAILY 60 each 5   cetirizine (ZYRTEC) 10 MG tablet Take 1 tablet (10 mg total) by mouth daily. 90 tablet 2   denosumab (PROLIA) 60 MG/ML SOSY injection Inject 60 mg into the skin every 6 (six) months.     gabapentin (NEURONTIN) 300 MG capsule Take 1 capsule (300 mg total) by mouth 2 (two) times daily. 60  capsule 1   glucose blood (ACCU-CHEK AVIVA PLUS) test strip Use to Monitor blood sugars daily 100 each 5   hydrochlorothiazide (MICROZIDE) 12.5 MG capsule Take 1 capsule (12.5 mg total) by mouth daily. 90 capsule 3   lidocaine-prilocaine (EMLA) cream Apply to affected area once 30 g 3   losartan (COZAAR) 100 MG tablet Take 1 tablet (100 mg total) by mouth daily. 90 tablet 2   metoprolol succinate (TOPROL-XL) 50 MG 24 hr tablet Take with or immediately following a meal. 90 tablet 2   Multiple Vitamin (MULTIVITAMIN) capsule Take 1 capsule by mouth daily.     omeprazole (PRILOSEC) 20 MG capsule TAKE 1 CAPSULE BY MOUTH ONCE DAILY 90 capsule 3   ondansetron (ZOFRAN) 8 MG tablet Take 1 tablet (8 mg total) by mouth every 8 (eight) hours as needed. 30 tablet 1   prochlorperazine (COMPAZINE) 10 MG tablet Take 1 tablet (10 mg total) by mouth every 6 (six) hours as needed (Nausea or vomiting). 30 tablet 1   Current Facility-Administered Medications  Medication Dose Route Frequency Provider Last Rate Last Admin   denosumab (PROLIA) injection 60 mg  60 mg Subcutaneous Q6 months [place], Kawanta F, MD   60 mg at 08/10/20 1000   Facility-Administered Medications Ordered in Other Visits  Medication Dose Route Frequency Provider Last Rate Last Admin   dacarbazine (DTIC) 630 mg in sodium chloride 0.9 % 250 mL chemo infusion  375 mg/m2 (Treatment Plan Recorded) Intravenous Once [name], Jaleal Schliep, MD       heparin lock flush 100 unit/mL  500 Units Intracatheter Once PRN [name], Reiana Poteet, MD       sodium chloride flush (NS) 0.9 % injection 10 mL  10 mL Intracatheter PRN [name], Yama Nielson, MD       vinBLAStine (VELBAN) 5.1 mg in sodium chloride 0.9 % 50 mL chemo infusion  3 mg/m2 (Treatment Plan Recorded) Intravenous Once Heath Lark, MD 331 mL/hr at 02/16/21 1040 5.1 mg at 02/16/21 1040    SUMMARY OF ONCOLOGIC HISTORY: Oncology History  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 Extensive hypermetabolic lymphadenopathy involving the neck, chest and abdomen as detailed above ([place] 5). I do not see any pelvic disease. 2. Possible involvement of the spleen. 3. Osseous involvement is also noted.     09/03/2020 Pathology Results   A. LYMPH NODE, LEFT NECK, EXCISION:  -Lymphoproliferative disorder consistent with classical Hodgkin lymphoma -See comment   COMMENT:   The sections show effacement of the lymph nodal architecture by a primarily nodular lymphoproliferative process.  In the more cellular areas, the nodules are composed of a mixture of small lymphocytes, plasma cells, abundant histiocytes in addition to atypical large mononuclear and  multi-lobated lymphoid appearing cells with variably prominent nucleoli.  The nodules are surrounded by dense collagenous fibrosis.  In the less cellular areas, there appears to be a fibroblastic proliferation and a much more depleted cellular appearance but scattering of large atypical lymphoid appearing cells is still seen.   Flow cytometric analysis was performed [facility] [phone]) and shows predominance of T lymphocytes with nonspecific changes including relative abundance of CD8 positive cells and reversal of the CD4: CD8 ratio.  No monoclonal B-cell population identified.  A battery of immunohistochemical stains was performed and shows that the large atypical lymphoid appearing cells are positive for CD30, Mum-1, PAX 5 (weak), CD23 in addition to variable but generally weak positivity for CD79a and CD20.  Only scattered large atypical lymphoid  cells show weak Golgi positivity for CD15.  The large atypical lymphoid cells are negative for LCA, CD10, CD3, CD5, EMA, ALK protein and EBV in situ  hybridization.  The small lymphoid cells in the background show a mixture of T and B cells with predominance of T cells.  T cells show a mixture of CD4 and CD8 positive cells with slight predominance of the latter.  The overall findings are most consistent with classical Hodgkin lymphoma, nodular sclerosis subtype.      09/21/2020 Procedure   Placement of a subcutaneous port device. Catheter tip at the superior cavoatrial junction.   09/25/2020 Echocardiogram   1. Left ventricular ejection fraction, by estimation, is 60 to 65%. The left ventricle has normal function. The left ventricle has no regional wall motion abnormalities. There is mild concentric left ventricular hypertrophy. Left ventricular diastolic parameters are consistent with Grade I diastolic dysfunction (impaired relaxation). The average left ventricular global longitudinal strain is -19.6 %. The global longitudinal strain is normal.  2. Right ventricular systolic function is normal. The right ventricular size is normal.  3. A small pericardial effusion is present. The pericardial effusion is surrounding the apex and anterior to the right ventricle. There is no evidence of cardiac tamponade.  4. The mitral valve is normal in structure. No evidence of mitral valve regurgitation.  5. The aortic valve is tricuspid. Aortic valve regurgitation is not visualized. Mild aortic valve sclerosis is present, with no evidence of aortic valve stenosis.  6. The inferior vena cava is normal in size with greater than 50% respiratory variability, suggesting right atrial pressure of 3 mmHg.    Chemotherapy    Patient is on Treatment Plan: HODGKINS LYMPHOMA  A + AVD Q28D       11/23/2020 PET scan   1. Interval significant response to therapy. The previously demonstrated hypermetabolic adenopathy in the  neck, chest and abdomen has nearly completely resolved. [place] 1 and 2. Slightly greater residual metabolic activity within hilar lymph nodes is less specific and may be reactive. 2. Resolution of previously demonstrated focal hypermetabolic activity in the spleen and bones. Diffuse bone marrow activity attributed to treatment response. 3. Stable incidental findings as detailed above.   01/11/2021 Echocardiogram    1. Stable EF and GLS compared to 09/25/20. Left ventricular ejection fraction, by estimation, is 60 to 65%. The left ventricle has normal function. The left ventricle has no regional wall motion abnormalities. Left ventricular diastolic parameters are consistent with Grade I diastolic dysfunction (impaired relaxation). The average left ventricular global longitudinal strain is -19.7 %. The global longitudinal strain is normal.  2. Right ventricular systolic function is normal. The right ventricular size is normal.  3. Left atrial size was mildly dilated.  4. The mitral valve is abnormal. Trivial mitral valve regurgitation. No evidence of mitral stenosis. Moderate mitral annular calcification.  5. The aortic valve is calcified. There is moderate calcification of the aortic valve. Aortic valve regurgitation is not visualized. Mild to moderate aortic valve sclerosis/calcification is present, without any evidence of aortic stenosis.  6. The inferior vena cava is normal in size with greater than 50% respiratory variability, suggesting right atrial pressure of 3 mmHg.     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[place])  09/14/2020 Initial Diagnosis   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[place])   09/29/2020 -  Chemotherapy    Patient is on Treatment Plan: HODGKINS LYMPHOMA  A + AVD Q28D         PHYSICAL EXAMINATION: ECOG PERFORMANCE STATUS: 1 - Symptomatic but completely ambulatory  Vitals:   02/16/21 0811  BP: (!) 151/83  Pulse: 86  Resp: 18  Temp: 99.1 F (37.3 C)  SpO2: 100%   Filed  Weights   02/16/21 0811  Weight: 147 lb 6.4 oz (66.9 kg)    GENERAL:alert, no distress and comfortable SKIN: skin color, texture, turgor are normal, no rashes or significant lesions EYES: normal, Conjunctiva are pink and non-injected, sclera clear OROPHARYNX:no exudate, no erythema and lips, buccal mucosa, and tongue normal  NECK: supple, thyroid normal size, non-tender, without nodularity LYMPH:  no palpable lymphadenopathy in the cervical, axillary or inguinal LUNGS: clear to auscultation and percussion with normal breathing effort HEART: regular rate & rhythm and no murmurs and no lower extremity edema ABDOMEN:abdomen soft, non-tender and normal bowel sounds Musculoskeletal:no cyanosis of digits and no clubbing  NEURO: alert & oriented x 3 with fluent speech, no focal motor/sensory deficits  LABORATORY DATA:  I have reviewed the data as listed    Component Value Date/Time   NA 143 02/16/2021 0755   NA 143 08/15/2016 1037   K 4.3 02/16/2021 0755   K 4.8 08/15/2016 1037   CL 107 02/16/2021 0755   CL 103 07/16/2012 1304   CO2 26 02/16/2021 0755   CO2 27 08/15/2016 1037   GLUCOSE 114 (H) 02/16/2021 0755   GLUCOSE 111 08/15/2016 1037   GLUCOSE 112 (H) 07/16/2012 1304   BUN 14 02/16/2021 0755   BUN 12.7 08/15/2016 1037   CREATININE 0.74 02/16/2021 0755   CREATININE 0.95 (H) 08/10/2020 1041   CREATININE 0.9 08/15/2016 1037   CALCIUM 9.4 02/16/2021 0755   CALCIUM 10.3 08/13/2020 1117   CALCIUM 10.5 (H) 08/15/2016 1037   PROT 6.2 (L) 02/16/2021 0755   PROT 7.9 08/15/2016 1037   ALBUMIN 3.8 02/16/2021 0755   ALBUMIN 4.5 08/15/2016 1037   AST 19 02/16/2021 0755   AST 30 08/15/2016 1037   ALT 14 02/16/2021 0755   ALT 29 08/15/2016 1037   ALKPHOS 55 02/16/2021 0755   ALKPHOS 67 08/15/2016 1037   BILITOT 0.5 02/16/2021 0755   BILITOT 0.80 08/15/2016 1037   GFRNONAA >60 02/16/2021 0755   GFRNONAA 62 12/05/2017 1000   GFRAA 72 12/05/2017 1000    No results found for: SPEP,  UPEP  Lab Results  Component Value Date   WBC 2.7 (L) 02/16/2021   NEUTROABS 1.4 (L) 02/16/2021   HGB 10.4 (L) 02/16/2021   HCT 30.9 (L) 02/16/2021   MCV 92.2 02/16/2021   PLT 171 02/16/2021      Chemistry      Component Value Date/Time   NA 143 02/16/2021 0755   NA 143 08/15/2016 1037   K 4.3 02/16/2021 0755  K 4.8 08/15/2016 1037   CL 107 02/16/2021 0755   CL 103 07/16/2012 1304   CO2 26 02/16/2021 0755   CO2 27 08/15/2016 1037   BUN 14 02/16/2021 0755   BUN 12.7 08/15/2016 1037   CREATININE 0.74 02/16/2021 0755   CREATININE 0.95 (H) 08/10/2020 1041   CREATININE 0.9 08/15/2016 1037      Component Value Date/Time   CALCIUM 9.4 02/16/2021 0755   CALCIUM 10.3 08/13/2020 1117   CALCIUM 10.5 (H) 08/15/2016 1037   ALKPHOS 55 02/16/2021 0755   ALKPHOS 67 08/15/2016 1037   AST 19 02/16/2021 0755   AST 30 08/15/2016 1037   ALT 14 02/16/2021 0755   ALT 29 08/15/2016 1037   BILITOT 0.5 02/16/2021 0755   BILITOT 0.80 08/15/2016 1037

## 2021-02-18 ENCOUNTER — Ambulatory Visit: Payer: Medicare PPO

## 2021-03-01 MED FILL — Fosaprepitant Dimeglumine For IV Infusion 150 MG (Base Eq): INTRAVENOUS | Qty: 5 | Status: AC

## 2021-03-02 ENCOUNTER — Inpatient Hospital Stay: Payer: Medicare PPO

## 2021-03-02 ENCOUNTER — Other Ambulatory Visit: Payer: Self-pay

## 2021-03-02 VITALS — BP 146/86 | HR 88 | Temp 99.3°F | Resp 18

## 2021-03-02 DIAGNOSIS — C8118 Nodular sclerosis classical Hodgkin lymphoma, lymph nodes of multiple sites: Secondary | ICD-10-CM

## 2021-03-02 DIAGNOSIS — Z5111 Encounter for antineoplastic chemotherapy: Secondary | ICD-10-CM | POA: Diagnosis not present

## 2021-03-02 LAB — CBC WITH DIFFERENTIAL (CANCER CENTER ONLY)
Abs Immature Granulocytes: 0.01 10*3/uL (ref 0.00–0.07)
Basophils Absolute: 0.1 10*3/uL (ref 0.0–0.1)
Basophils Relative: 2 %
Eosinophils Absolute: 0.2 10*3/uL (ref 0.0–0.5)
Eosinophils Relative: 7 %
HCT: 33 % — ABNORMAL LOW (ref 36.0–46.0)
Hemoglobin: 10.8 g/dL — ABNORMAL LOW (ref 12.0–15.0)
Immature Granulocytes: 0 %
Lymphocytes Relative: 24 %
Lymphs Abs: 0.6 10*3/uL — ABNORMAL LOW (ref 0.7–4.0)
MCH: 29.7 pg (ref 26.0–34.0)
MCHC: 32.7 g/dL (ref 30.0–36.0)
MCV: 90.7 fL (ref 80.0–100.0)
Monocytes Absolute: 0.5 10*3/uL (ref 0.1–1.0)
Monocytes Relative: 20 %
Neutro Abs: 1.1 10*3/uL — ABNORMAL LOW (ref 1.7–7.7)
Neutrophils Relative %: 47 %
Platelet Count: 223 10*3/uL (ref 150–400)
RBC: 3.64 MIL/uL — ABNORMAL LOW (ref 3.87–5.11)
RDW: 13 % (ref 11.5–15.5)
WBC Count: 2.3 10*3/uL — ABNORMAL LOW (ref 4.0–10.5)
nRBC: 0 % (ref 0.0–0.2)

## 2021-03-02 LAB — CMP (CANCER CENTER ONLY)
ALT: 16 U/L (ref 0–44)
AST: 19 U/L (ref 15–41)
Albumin: 4 g/dL (ref 3.5–5.0)
Alkaline Phosphatase: 58 U/L (ref 38–126)
Anion gap: 10 (ref 5–15)
BUN: 12 mg/dL (ref 8–23)
CO2: 25 mmol/L (ref 22–32)
Calcium: 9.3 mg/dL (ref 8.9–10.3)
Chloride: 108 mmol/L (ref 98–111)
Creatinine: 0.75 mg/dL (ref 0.44–1.00)
GFR, Estimated: >60 mL/min (ref >60)
Glucose, Bld: 135 mg/dL — ABNORMAL HIGH (ref 70–99)
Potassium: 4 mmol/L (ref 3.5–5.1)
Sodium: 143 mmol/L (ref 135–145)
Total Bilirubin: 0.4 mg/dL (ref 0.3–1.2)
Total Protein: 6.4 g/dL — ABNORMAL LOW (ref 6.5–8.1)

## 2021-03-02 MED ORDER — SODIUM CHLORIDE 0.9 % IV SOLN
375.0000 mg/m2 | Freq: Once | INTRAVENOUS | Status: AC
  Administered 2021-03-02: 630 mg via INTRAVENOUS
  Filled 2021-03-02: qty 63

## 2021-03-02 MED ORDER — SODIUM CHLORIDE 0.9% FLUSH
10.0000 mL | INTRAVENOUS | Status: DC | PRN
  Administered 2021-03-02: 10 mL

## 2021-03-02 MED ORDER — SODIUM CHLORIDE 0.9 % IV SOLN
Freq: Once | INTRAVENOUS | Status: AC
Start: 2021-03-02 — End: 2021-03-02

## 2021-03-02 MED ORDER — SODIUM CHLORIDE 0.9% FLUSH
10.0000 mL | Freq: Once | INTRAVENOUS | Status: AC
  Administered 2021-03-02: 10 mL

## 2021-03-02 MED ORDER — SODIUM CHLORIDE 0.9 % IV SOLN
150.0000 mg | Freq: Once | INTRAVENOUS | Status: AC
  Administered 2021-03-02: 150 mg via INTRAVENOUS
  Filled 2021-03-02: qty 150

## 2021-03-02 MED ORDER — DEXAMETHASONE SODIUM PHOSPHATE 10 MG/ML IJ SOLN
5.0000 mg | Freq: Once | INTRAMUSCULAR | Status: AC
  Administered 2021-03-02: 5 mg via INTRAVENOUS
  Filled 2021-03-02: qty 1

## 2021-03-02 MED ORDER — DOXORUBICIN HCL CHEMO IV INJECTION 2 MG/ML
25.0000 mg/m2 | Freq: Once | INTRAVENOUS | Status: AC
  Administered 2021-03-02: 42 mg via INTRAVENOUS
  Filled 2021-03-02: qty 21

## 2021-03-02 MED ORDER — HEPARIN SOD (PORK) LOCK FLUSH 100 UNIT/ML IV SOLN
500.0000 [IU] | Freq: Once | INTRAVENOUS | Status: AC | PRN
  Administered 2021-03-02: 500 [IU]

## 2021-03-02 MED ORDER — PALONOSETRON HCL INJECTION 0.25 MG/5ML
0.2500 mg | Freq: Once | INTRAVENOUS | Status: AC
  Administered 2021-03-02: 0.25 mg via INTRAVENOUS
  Filled 2021-03-02: qty 5

## 2021-03-02 MED ORDER — VINBLASTINE SULFATE CHEMO INJECTION 1 MG/ML
3.0000 mg/m2 | Freq: Once | INTRAVENOUS | Status: AC
  Administered 2021-03-02: 5.1 mg via INTRAVENOUS
  Filled 2021-03-02: qty 5.1

## 2021-03-02 NOTE — Patient Instructions (Signed)
[place]   Happy Last Treatment Day! Discharge Instructions: Thank you for choosing [facility] to provide your oncology and hematology care.   If you have a lab appointment with the [facility], please go directly to the [facility] and check in at the registration area.   Wear comfortable clothing and clothing appropriate for easy access to any Portacath or PICC line.   We strive to give you quality time with your provider. You may need to reschedule your appointment if you arrive late (15 or more minutes).  Arriving late affects you and other patients whose appointments are after yours.  Also, if you miss three or more appointments without notifying the office, you may be dismissed from the clinic at the provider's discretion.      For prescription refill requests, have your pharmacy contact our office and allow 72 hours for refills to be completed.    Today you received the following chemotherapy and/or immunotherapy agents: Doxorubicin (Adriamycin), Vinblastine (Velban), Dacarbazine (Dtic)     To help prevent nausea and vomiting after your treatment, we encourage you to take your nausea medication as directed.  BELOW ARE SYMPTOMS THAT SHOULD BE REPORTED IMMEDIATELY: *FEVER GREATER THAN 100.4 F (38 C) OR HIGHER *CHILLS OR SWEATING *NAUSEA AND VOMITING THAT IS NOT CONTROLLED WITH YOUR NAUSEA MEDICATION *UNUSUAL SHORTNESS OF BREATH *UNUSUAL BRUISING OR BLEEDING *URINARY PROBLEMS (pain or burning when urinating, or frequent urination) *BOWEL PROBLEMS (unusual diarrhea, constipation, pain near the anus) TENDERNESS IN MOUTH AND THROAT WITH OR WITHOUT PRESENCE OF ULCERS (sore throat, sores in mouth, or a toothache) UNUSUAL RASH, SWELLING OR PAIN  UNUSUAL VAGINAL DISCHARGE OR ITCHING   Items with * indicate a potential emergency and should be followed up as soon as possible or go to the Emergency Department if any problems should  occur.  Please show the CHEMOTHERAPY ALERT CARD or IMMUNOTHERAPY ALERT CARD at check-in to the Emergency Department and triage nurse.  Should you have questions after your visit or need to cancel or reschedule your appointment, please contact [facility]  Dept: [phone]  and follow the prompts.  Office hours are 8:00 a.m. to 4:30 p.m. Monday - Friday. Please note that voicemails left after 4:00 p.m. may not be returned until the following business day.  We are closed weekends and major holidays. You have access to a nurse at all times for urgent questions. Please call the main number to the clinic Dept: [phone] and follow the prompts.   For any non-urgent questions, you may also contact your provider using MyChart. We now offer e-Visits for anyone 9 and older to request care online for non-urgent symptoms. For details visit [email or web address].   Also download the MyChart app! Go to the app store, search "MyChart", open the app, select [facility], and log in with your MyChart username and password.  Due to Covid, a mask is required upon entering the hospital/clinic. If you do not have a mask, one will be given to you upon arrival. For doctor visits, patients may have 1 support person aged 43 or older with them. For treatment visits, patients cannot have anyone with them due to current Covid guidelines and our immunocompromised population.

## 2021-03-04 ENCOUNTER — Inpatient Hospital Stay: Payer: Medicare PPO

## 2021-03-31 ENCOUNTER — Other Ambulatory Visit: Payer: Self-pay

## 2021-03-31 ENCOUNTER — Encounter (HOSPITAL_COMMUNITY)
Admission: RE | Admit: 2021-03-31 | Discharge: 2021-03-31 | Disposition: A | Discharge status: Home / Self Care | Payer: Medicare PPO | Source: Ambulatory Visit | Attending: Hematology and Oncology | Admitting: Hematology and Oncology

## 2021-03-31 ENCOUNTER — Inpatient Hospital Stay: Payer: Medicare PPO | Attending: Hematology and Oncology

## 2021-03-31 DIAGNOSIS — R918 Other nonspecific abnormal finding of lung field: Secondary | ICD-10-CM | POA: Insufficient documentation

## 2021-03-31 DIAGNOSIS — C8118 Nodular sclerosis classical Hodgkin lymphoma, lymph nodes of multiple sites: Secondary | ICD-10-CM

## 2021-03-31 DIAGNOSIS — C8198 Hodgkin lymphoma, unspecified, lymph nodes of multiple sites: Secondary | ICD-10-CM | POA: Insufficient documentation

## 2021-03-31 DIAGNOSIS — I700 Atherosclerosis of aorta: Secondary | ICD-10-CM | POA: Insufficient documentation

## 2021-03-31 DIAGNOSIS — Z79899 Other long term (current) drug therapy: Secondary | ICD-10-CM | POA: Insufficient documentation

## 2021-03-31 DIAGNOSIS — I2510 Atherosclerotic heart disease of native coronary artery without angina pectoris: Secondary | ICD-10-CM | POA: Diagnosis not present

## 2021-03-31 DIAGNOSIS — G629 Polyneuropathy, unspecified: Secondary | ICD-10-CM | POA: Insufficient documentation

## 2021-03-31 DIAGNOSIS — M4316 Spondylolisthesis, lumbar region: Secondary | ICD-10-CM | POA: Insufficient documentation

## 2021-03-31 DIAGNOSIS — Z7982 Long term (current) use of aspirin: Secondary | ICD-10-CM | POA: Insufficient documentation

## 2021-03-31 LAB — CBC WITH DIFFERENTIAL (CANCER CENTER ONLY)
Abs Immature Granulocytes: 0.03 10*3/uL (ref 0.00–0.07)
Basophils Absolute: 0.1 10*3/uL (ref 0.0–0.1)
Basophils Relative: 1 %
Eosinophils Absolute: 0.1 10*3/uL (ref 0.0–0.5)
Eosinophils Relative: 2 %
HCT: 35.9 % — ABNORMAL LOW (ref 36.0–46.0)
Hemoglobin: 12 g/dL (ref 12.0–15.0)
Immature Granulocytes: 0 %
Lymphocytes Relative: 17 %
Lymphs Abs: 1.1 10*3/uL (ref 0.7–4.0)
MCH: 29.6 pg (ref 26.0–34.0)
MCHC: 33.4 g/dL (ref 30.0–36.0)
MCV: 88.4 fL (ref 80.0–100.0)
Monocytes Absolute: 0.8 10*3/uL (ref 0.1–1.0)
Monocytes Relative: 11 %
Neutro Abs: 4.7 10*3/uL (ref 1.7–7.7)
Neutrophils Relative %: 69 %
Platelet Count: 238 10*3/uL (ref 150–400)
RBC: 4.06 MIL/uL (ref 3.87–5.11)
RDW: 13.9 % (ref 11.5–15.5)
WBC Count: 6.8 10*3/uL (ref 4.0–10.5)
nRBC: 0 % (ref 0.0–0.2)

## 2021-03-31 LAB — CMP (CANCER CENTER ONLY)
ALT: 18 U/L (ref 0–44)
AST: 26 U/L (ref 15–41)
Albumin: 4.3 g/dL (ref 3.5–5.0)
Alkaline Phosphatase: 57 U/L (ref 38–126)
Anion gap: 11 (ref 5–15)
BUN: 17 mg/dL (ref 8–23)
CO2: 24 mmol/L (ref 22–32)
Calcium: 9.7 mg/dL (ref 8.9–10.3)
Chloride: 106 mmol/L (ref 98–111)
Creatinine: 0.77 mg/dL (ref 0.44–1.00)
GFR, Estimated: >60 mL/min (ref >60)
Glucose, Bld: 125 mg/dL — ABNORMAL HIGH (ref 70–99)
Potassium: 3.7 mmol/L (ref 3.5–5.1)
Sodium: 141 mmol/L (ref 135–145)
Total Bilirubin: 0.6 mg/dL (ref 0.3–1.2)
Total Protein: 7 g/dL (ref 6.5–8.1)

## 2021-03-31 LAB — GLUCOSE, CAPILLARY: Glucose-Capillary: 148 mg/dL — ABNORMAL HIGH (ref 70–99)

## 2021-03-31 MED ORDER — SODIUM CHLORIDE 0.9% FLUSH
10.0000 mL | Freq: Once | INTRAVENOUS | Status: AC
  Administered 2021-03-31: 10 mL

## 2021-04-02 ENCOUNTER — Encounter: Payer: Self-pay | Admitting: Hematology and Oncology

## 2021-04-02 ENCOUNTER — Telehealth: Payer: Self-pay | Admitting: Hematology and Oncology

## 2021-04-02 ENCOUNTER — Inpatient Hospital Stay: Payer: Medicare PPO | Admitting: Hematology and Oncology

## 2021-04-02 ENCOUNTER — Other Ambulatory Visit: Payer: Self-pay

## 2021-04-02 DIAGNOSIS — M4316 Spondylolisthesis, lumbar region: Secondary | ICD-10-CM | POA: Diagnosis not present

## 2021-04-02 DIAGNOSIS — I700 Atherosclerosis of aorta: Secondary | ICD-10-CM | POA: Insufficient documentation

## 2021-04-02 DIAGNOSIS — Z79899 Other long term (current) drug therapy: Secondary | ICD-10-CM | POA: Insufficient documentation

## 2021-04-02 DIAGNOSIS — C8118 Nodular sclerosis classical Hodgkin lymphoma, lymph nodes of multiple sites: Secondary | ICD-10-CM

## 2021-04-02 DIAGNOSIS — R918 Other nonspecific abnormal finding of lung field: Secondary | ICD-10-CM | POA: Diagnosis not present

## 2021-04-02 DIAGNOSIS — I2510 Atherosclerotic heart disease of native coronary artery without angina pectoris: Secondary | ICD-10-CM | POA: Insufficient documentation

## 2021-04-02 DIAGNOSIS — C8198 Hodgkin lymphoma, unspecified, lymph nodes of multiple sites: Secondary | ICD-10-CM | POA: Diagnosis not present

## 2021-04-02 DIAGNOSIS — Z7982 Long term (current) use of aspirin: Secondary | ICD-10-CM | POA: Diagnosis not present

## 2021-04-02 DIAGNOSIS — G629 Polyneuropathy, unspecified: Secondary | ICD-10-CM | POA: Diagnosis not present

## 2021-04-02 NOTE — Progress Notes (Signed)
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 [facility]) I have reviewed multiple imaging studies with the patient and her son Overall, she has complete response to therapy I suspect the lung nodules are not related to her disease Inflammatory changes or postinfectious changes are most likely the cause of those lung changes Plan to repeat noncontrast CT scan in January 2023 I will see her again in 2 months for further follow-up  Pulmonary infiltrate The patient is not symptomatic I recommend repeat CT imaging in 3 months and she is in agreement  No orders of the defined types were placed in this encounter.   All questions were answered. The patient knows to call the clinic with any problems, questions or concerns. The total time spent in the appointment was 20 minutes encounter with patients including review of chart and various tests results, discussions about plan of care and coordination of care plan   Heath Lark, MD 04/02/2021 3:17 PM  INTERVAL HISTORY: Please see below for problem oriented charting. she returns for treatment follow-up and follow-up on imaging study after completion of treatment for Hodgkin lymphoma Since last time I saw her, she feels well Neuropathy is stable No recent cough, chest pain or shortness of breath  REVIEW OF SYSTEMS:   Constitutional: Denies fevers, chills or abnormal weight loss Eyes: Denies blurriness of vision Ears, nose, mouth, throat, and face: Denies mucositis or sore throat Respiratory: Denies cough, dyspnea or wheezes Cardiovascular: Denies palpitation, chest discomfort or lower extremity swelling Gastrointestinal:  Denies nausea, heartburn or change in bowel  habits Skin: Denies abnormal skin rashes Lymphatics: Denies new lymphadenopathy or easy bruising Neurological:Denies numbness, tingling or new weaknesses Behavioral/Psych: Mood is stable, no new changes  All other systems were reviewed with the patient and are negative.  I have reviewed the past medical history, past surgical history, social history and family history with the patient and they are unchanged from previous note.  ALLERGIES:  is allergic to codeine.  MEDICATIONS:  Current Outpatient Medications  Medication Sig Dispense Refill   Acetaminophen (TYLENOL) 325 MG CAPS Take by mouth.     acyclovir (ZOVIRAX) 400 MG tablet Take 1 tablet (400 mg total) by mouth 2 (two) times daily. 60 tablet 6   albuterol (VENTOLIN HFA) 108 (90 Base) MCG/ACT inhaler Inhale 2 puffs into the lungs every 6 (six) hours as needed for wheezing or shortness of breath. 8 g 0   aspirin EC 81 MG tablet Take 81 mg by mouth daily.     atorvastatin (LIPITOR) 20 MG tablet TAKE 1 TABLET BY MOUTH ONCE DAILY 90 tablet 3   BREO ELLIPTA 100-25 MCG/INH AEPB INHALE 1 PUFF INTO THE LUNGS DAILY 60 each 5   cetirizine (ZYRTEC) 10 MG tablet Take 1 tablet (10 mg total) by mouth daily. 90 tablet 2   denosumab (PROLIA) 60 MG/ML SOSY injection Inject 60 mg into the skin every 6 (six) months.     gabapentin (NEURONTIN) 300 MG capsule Take 1 capsule (300 mg total) by mouth 2 (two) times daily. 60 capsule 1   glucose blood (ACCU-CHEK AVIVA PLUS) test strip Use to Monitor blood sugars daily 100 each 5   hydrochlorothiazide (MICROZIDE) 12.5 MG capsule Take 1 capsule (12.5 mg total) by  mouth daily. 90 capsule 3   lidocaine-prilocaine (EMLA) cream Apply to affected area once 30 g 3   losartan (COZAAR) 100 MG tablet Take 1 tablet (100 mg total) by mouth daily. 90 tablet 2   metoprolol succinate (TOPROL-XL) 50 MG 24 hr tablet Take with or immediately following a meal. 90 tablet 2   Multiple Vitamin (MULTIVITAMIN) capsule Take 1 capsule  by mouth daily.     omeprazole (PRILOSEC) 20 MG capsule TAKE 1 CAPSULE BY MOUTH ONCE DAILY 90 capsule 3   ondansetron (ZOFRAN) 8 MG tablet Take 1 tablet (8 mg total) by mouth every 8 (eight) hours as needed. 30 tablet 1   prochlorperazine (COMPAZINE) 10 MG tablet Take 1 tablet (10 mg total) by mouth every 6 (six) hours as needed (Nausea or vomiting). 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 Extensive hypermetabolic lymphadenopathy involving the neck, chest and abdomen as detailed above (Deauville 5). I do not see any pelvic disease. 2. Possible involvement of the spleen. 3. Osseous involvement is also noted.     09/03/2020 Pathology Results   A. LYMPH NODE, LEFT NECK, EXCISION:  -Lymphoproliferative disorder consistent with classical Hodgkin lymphoma -See comment   COMMENT:   The sections show effacement of the lymph nodal architecture by a primarily nodular lymphoproliferative process.  In the more cellular areas, the nodules are composed of a mixture of small lymphocytes, plasma cells, abundant histiocytes in addition to atypical large mononuclear and multi-lobated lymphoid appearing cells with variably prominent nucleoli.  The nodules are surrounded by dense  collagenous fibrosis.  In the less cellular areas, there appears to be a fibroblastic proliferation and a much more depleted cellular appearance but scattering of large atypical lymphoid appearing cells is still seen.   Flow cytometric analysis was performed [facility] [phone]) and shows predominance of T lymphocytes with nonspecific changes including relative abundance of CD8 positive cells and reversal of the CD4: CD8 ratio.  No monoclonal B-cell population identified.  A battery of immunohistochemical stains was performed and shows that the large atypical lymphoid appearing cells are positive for CD30, Mum-1, PAX 5 (weak), CD23 in addition to variable but generally weak positivity for CD79a and CD20.  Only scattered large atypical lymphoid cells show weak Golgi positivity for CD15.  The large atypical lymphoid cells are negative for LCA, CD10, CD3, CD5, EMA, ALK protein and EBV in situ  hybridization.  The small lymphoid cells in the background show a mixture of T and B cells with predominance of T cells.  T cells show a mixture of CD4 and CD8 positive cells with slight predominance of the latter.  The overall findings are most consistent with classical Hodgkin lymphoma, nodular sclerosis subtype.      09/21/2020 Procedure   Placement of a subcutaneous port device.  Catheter tip at the superior cavoatrial junction.   09/25/2020 Echocardiogram   1. Left ventricular ejection fraction, by estimation, is 60 to 65%. The left ventricle has normal function. The left ventricle has no regional wall motion abnormalities. There is mild concentric left ventricular hypertrophy. Left ventricular diastolic parameters are consistent with Grade I diastolic dysfunction (impaired relaxation). The average left ventricular global longitudinal strain is -19.6 %. The global longitudinal strain is normal.  2. Right ventricular systolic function is normal. The right ventricular size is normal.  3. A small pericardial effusion is  present. The pericardial effusion is surrounding the apex and anterior to the right ventricle. There is no evidence of cardiac tamponade.  4. The mitral valve is normal in structure. No evidence of mitral valve regurgitation.  5. The aortic valve is tricuspid. Aortic valve regurgitation is not visualized. Mild aortic valve sclerosis is present, with no evidence of aortic valve stenosis.  6. The inferior vena cava is normal in size with greater than 50% respiratory variability, suggesting right atrial pressure of 3 mmHg.    Chemotherapy    Patient is on Treatment Plan: HODGKINS LYMPHOMA  A + AVD Q28D       11/23/2020 PET scan   1. Interval significant response to therapy. The previously demonstrated hypermetabolic adenopathy in the neck, chest and abdomen has nearly completely resolved. Deauville 1 and 2. Slightly greater residual metabolic activity within hilar lymph nodes is less specific and may be reactive. 2. Resolution of previously demonstrated focal hypermetabolic activity in the spleen and bones. Diffuse bone marrow activity attributed to treatment response. 3. Stable incidental findings as detailed above.   01/11/2021 Echocardiogram    1. Stable EF and GLS compared to 09/25/20. Left ventricular ejection fraction, by estimation, is 60 to 65%. The left ventricle has normal function. The left ventricle has no regional wall motion abnormalities. Left ventricular diastolic parameters are consistent with Grade I diastolic dysfunction (impaired relaxation). The average left ventricular global longitudinal strain is -19.7 %. The global longitudinal strain is normal.  2. Right ventricular systolic function is normal. The right ventricular size is normal.  3. Left atrial size was mildly dilated.  4. The mitral valve is abnormal. Trivial mitral valve regurgitation. No evidence of mitral stenosis. Moderate mitral annular calcification.  5. The aortic valve is calcified. There is moderate calcification  of the aortic valve. Aortic valve regurgitation is not visualized. Mild to moderate aortic valve sclerosis/calcification is present, without any evidence of aortic stenosis.  6. The inferior vena cava is normal in size with greater than 50% respiratory variability, suggesting right atrial pressure of 3 mmHg.     04/01/2021 PET scan   Resolution of bilateral hilar activity and decreased size of LEFT retroperitoneal lymph node with stable SUV value less than mediastinal blood pool.   Interval development of pulmonary nodules with increased metabolic activity. Findings are nonspecific. Nodule in the RIGHT upper lobe has a branching pattern that could be seen in the setting of infectious or inflammatory changes. This is considered due to relatively rapid development. There is however moderate, relatively higher uptake in the LEFT lower lobe raising the question of neoplasm including lymphoma.   Three-vessel coronary artery disease.   Aortic Atherosclerosis (ICD10-I70.0).     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[place])   09/29/2020 -  Chemotherapy    Patient is on Treatment Plan: HODGKINS LYMPHOMA  A + AVD Q28D  PHYSICAL EXAMINATION: ECOG PERFORMANCE STATUS: 0 - Asymptomatic  Vitals:   04/02/21 0804  BP: (!) 155/74  Pulse: 91  Resp: 18  Temp: 97.7 F (36.5 C)  SpO2: 100%   Filed Weights   04/02/21 0804  Weight: 147 lb (66.7 kg)    GENERAL:alert, no distress and comfortable NEURO: alert & oriented x 3 with fluent speech, no focal motor/sensory deficits  LABORATORY DATA:  I have reviewed the data as listed    Component Value Date/Time   NA 141 03/31/2021 0742   NA 143 08/15/2016 1037   K 3.7 03/31/2021 0742   K 4.8 08/15/2016 1037   CL 106 03/31/2021 0742   CL 103 07/16/2012 1304   CO2 24 03/31/2021 0742   CO2 27 08/15/2016 1037   GLUCOSE 125 (H) 03/31/2021 0742   GLUCOSE 111 08/15/2016  1037   GLUCOSE 112 (H) 07/16/2012 1304   BUN 17 03/31/2021 0742   BUN 12.7 08/15/2016 1037   CREATININE 0.77 03/31/2021 0742   CREATININE 0.95 (H) 08/10/2020 1041   CREATININE 0.9 08/15/2016 1037   CALCIUM 9.7 03/31/2021 0742   CALCIUM 10.3 08/13/2020 1117   CALCIUM 10.5 (H) 08/15/2016 1037   PROT 7.0 03/31/2021 0742   PROT 7.9 08/15/2016 1037   ALBUMIN 4.3 03/31/2021 0742   ALBUMIN 4.5 08/15/2016 1037   AST 26 03/31/2021 0742   AST 30 08/15/2016 1037   ALT 18 03/31/2021 0742   ALT 29 08/15/2016 1037   ALKPHOS 57 03/31/2021 0742   ALKPHOS 67 08/15/2016 1037   BILITOT 0.6 03/31/2021 0742   BILITOT 0.80 08/15/2016 1037   GFRNONAA >60 03/31/2021 0742   GFRNONAA 62 12/05/2017 1000   GFRAA 72 12/05/2017 1000    No results found for: SPEP, UPEP  Lab Results  Component Value Date   WBC 6.8 03/31/2021   NEUTROABS 4.7 03/31/2021   HGB 12.0 03/31/2021   HCT 35.9 (L) 03/31/2021   MCV 88.4 03/31/2021   PLT 238 03/31/2021      Chemistry      Component Value Date/Time   NA 141 03/31/2021 0742   NA 143 08/15/2016 1037   K 3.7 03/31/2021 0742   K 4.8 08/15/2016 1037   CL 106 03/31/2021 0742   CL 103 07/16/2012 1304   CO2 24 03/31/2021 0742   CO2 27 08/15/2016 1037   BUN 17 03/31/2021 0742   BUN 12.7 08/15/2016 1037   CREATININE 0.77 03/31/2021 0742   CREATININE 0.95 (H) 08/10/2020 1041   CREATININE 0.9 08/15/2016 1037      Component Value Date/Time   CALCIUM 9.7 03/31/2021 0742   CALCIUM 10.3 08/13/2020 1117   CALCIUM 10.5 (H) 08/15/2016 1037   ALKPHOS 57 03/31/2021 0742   ALKPHOS 67 08/15/2016 1037   AST 26 03/31/2021 0742   AST 30 08/15/2016 1037   ALT 18 03/31/2021 0742   ALT 29 08/15/2016 1037   BILITOT 0.6 03/31/2021 0742   BILITOT 0.80 08/15/2016 1037       RADIOGRAPHIC STUDIES: I have reviewed multiple imaging studies with the patient and her son I have personally reviewed the radiological images as listed and agreed with the findings in the report. NM  PET Image Restage (PS) Skull Base to Thigh (F-18 FDG)  Result Date: 04/01/2021 CLINICAL DATA:  Subsequent treatment strategy for hematologic malignancy, assess treatment response in a 74 year old female. EXAM: NUCLEAR MEDICINE PET SKULL BASE TO THIGH TECHNIQUE: 7.3 mCi F-18 FDG was injected intravenously. Full-ring PET imaging was performed from  the skull base to thigh after the radiotracer. CT data was obtained and used for attenuation correction and anatomic localization. Fasting blood glucose: 148 mg/dl COMPARISON:  Comparison made with November 23, 2020 and multiple prior studies. FINDINGS: Mediastinal blood pool activity: SUV max 2.42 Liver activity: SUV max 3.88 NECK: No hypermetabolic lymph nodes in the neck. Incidental CT findings: none CHEST: Irregular nodule in the RIGHT upper lobe (image 59/4) 11 x 6 mm with a maximum SUV of 2.8. Small nodule in the LEFT costodiaphragmatic sulcus is 10 x 9 mm (image 94/4) maximum SUV of 4.4. Areas are new since the prior study. Metabolic activity in the bilateral hila is diminished since previous imaging. No discrete lymph nodes in the LEFT or RIGHT hilum on noncontrast imaging. No hypermetabolic lymph nodes in the chest. Incidental CT findings: Calcified atheromatous plaque in the thoracic aorta. Three-vessel coronary artery disease. Normal heart size without substantial pericardial effusion. No effusion or consolidative process in the chest. Airways are patent. No additional suspicious pulmonary lesions. ABDOMEN/PELVIS: Splenic activity less than liver. Spleen is unenlarged. No adenopathy in the abdomen or in the pelvis no solid organ uptake to suggest neoplasm. LEFT retroperitoneal lymph node at 9 mm was previously 11 mm (image 117/4) maximum SUV of 1.8 is unchanged. Incidental CT findings: Post cholecystectomy. Stable hyperdense LEFT renal lesion indeterminate but unchanged and likely a hemorrhagic cyst. Colonic diverticulosis. Aortic atherosclerosis. Calcified  atheromatous plaque of the abdominal aorta without aneurysm. Stable LEFT adnexal cystic lesion without FDG uptake at 4.1 cm (image 160/4) SKELETON: No focal hypermetabolic activity to suggest skeletal metastasis. Incidental CT findings: Spinal degenerative changes. Grade 2 anterolisthesis of L4 on L5 as before. IMPRESSION: Resolution of bilateral hilar activity and decreased size of LEFT retroperitoneal lymph node with stable SUV value less than mediastinal blood pool. Interval development of pulmonary nodules with increased metabolic activity. Findings are nonspecific. Nodule in the RIGHT upper lobe has a branching pattern that could be seen in the setting of infectious or inflammatory changes. This is considered due to relatively rapid development. There is however moderate, relatively higher uptake in the LEFT lower lobe raising the question of neoplasm including lymphoma. Three-vessel coronary artery disease. Aortic Atherosclerosis (ICD10-I70.0). Electronically Signed   By: Zetta Bills M.D.   On: 04/01/2021 10:33

## 2021-04-02 NOTE — Telephone Encounter (Signed)
Scheduled per 10/21 los, pt has confirmed appt

## 2021-04-02 NOTE — Assessment & Plan Note (Signed)
The patient is not symptomatic I recommend repeat CT imaging in 3 months and she is in agreement

## 2021-04-02 NOTE — Assessment & Plan Note (Signed)
I have reviewed multiple imaging studies with the patient and her son Overall, she has complete response to therapy I suspect the lung nodules are not related to her disease Inflammatory changes or postinfectious changes are most likely the cause of those lung changes Plan to repeat noncontrast CT scan in January 2023 I will see her again in 2 months for further follow-up

## 2021-04-05 ENCOUNTER — Other Ambulatory Visit: Payer: Self-pay

## 2021-04-05 ENCOUNTER — Ambulatory Visit: Payer: Medicare PPO | Admitting: Nurse Practitioner

## 2021-04-05 ENCOUNTER — Encounter: Payer: Self-pay | Admitting: Nurse Practitioner

## 2021-04-05 VITALS — BP 138/70 | HR 62 | Temp 97.3°F | Resp 12 | Ht 63.0 in | Wt 145.0 lb

## 2021-04-05 DIAGNOSIS — R7303 Prediabetes: Secondary | ICD-10-CM | POA: Diagnosis not present

## 2021-04-05 DIAGNOSIS — E7800 Pure hypercholesterolemia, unspecified: Secondary | ICD-10-CM

## 2021-04-05 DIAGNOSIS — R6889 Other general symptoms and signs: Secondary | ICD-10-CM | POA: Diagnosis not present

## 2021-04-05 DIAGNOSIS — I10 Essential (primary) hypertension: Secondary | ICD-10-CM | POA: Diagnosis not present

## 2021-04-05 DIAGNOSIS — H5789 Other specified disorders of eye and adnexa: Secondary | ICD-10-CM

## 2021-04-05 DIAGNOSIS — G609 Hereditary and idiopathic neuropathy, unspecified: Secondary | ICD-10-CM | POA: Diagnosis not present

## 2021-04-05 DIAGNOSIS — G629 Polyneuropathy, unspecified: Secondary | ICD-10-CM | POA: Diagnosis not present

## 2021-04-05 NOTE — Assessment & Plan Note (Signed)
Chronic.  BP is at goal today in clinic.  Recent electrolytes and kidney function good.  Continue current medications.  Follow up in 4 months.

## 2021-04-05 NOTE — Assessment & Plan Note (Signed)
Chronic.  Check lipids today - patient is fasting.  LDL goal less than 100.  Continue atorvastatin 20 mg daily for now.  Recent liver function normal.  Follow up in 4 months.

## 2021-04-05 NOTE — Progress Notes (Signed)
Subjective:    Patient ID: Cathy [name], female    DOB: 10/02/1945, 75 y.o.   MRN: [id number]  HPI: Cathy [name] is a 75 y.o. female presenting for follow up.  Chief Complaint  Patient presents with   Follow-up    4 month f/u   Patient recently completed treatment for Hodgkin lymphoma.  She is very excited to be finished.  Next follow up with Oncologist is later this year.  Has eye exam with Dr. Herbert Deaner in December.  HYPERTENSION / HYPERLIPIDEMIA Currently taking HCTZ 12.5 mg daily, losartan 100 mg daily.  Taking atorvastatin 20 mg daily for hyperlipidemia.  Aspirin: yes Recent stressors: no Recurrent headaches: no Visual changes: no; got something in eye yesterday and eye was inflammed.  Vision unchanged.  No matting of eye lid or itching.  Requesting it be checked.  Palpitations: no Dyspnea: no Chest pain: no Lower extremity edema: no Dizzy/lightheaded: no Myalgias: no The 10-year ASCVD risk score (Arnett DK, et al., 2019) is: 21.1%   Values used to calculate the score:     Age: 28 years     Sex: Female     Is Non-Hispanic African American: No     Diabetic: No     Tobacco smoker: No     Systolic Blood Pressure: 762 mmHg     Is BP treated: Yes     HDL Cholesterol: 31 mg/dL     Total Cholesterol: 140 mg/dL  NEUROPATHY Feels like walking on bubble wrap, worse at night.  Takes gabapentin 300 mg as needed, not every night.  She is nervous to take it more frequently.  Neuropathy status: stable  Satisfied with current treatment?: yes Medication side effects: no Medication compliance:  fair compliance Location: feet bilaterally Pain: yes Severity: moderate  Quality: stinging Frequency: intermittent; worse at night Bilateral: yes Symmetric: yes Numbness: no Decreased sensation: no Weakness: no Context: stable Alleviating factors: Gabapentin 300 mg Aggravating factors: night time Treatments attempted: Gabapentin, foot machine  OSTEOPENIA Prolia has  been on hold since cancer treatment.  Bone Density in 2020 - left femur neck.  She is having repeat bone density later this year.  Satisfied with current treatment?: n/a Adequate calcium & vitamin D: yes; recently started back multivitamin  PRE-DIABETES Last A1c improved to 6.0%.  Has been having more of a sweet tooth. Hypoglycemic episodes:no Polydipsia/polyuria: no Visual disturbance: no Chest pain: no Paresthesias: yes Glucose Monitoring: no  Patient also asks me to check her right eye - woke up yesterday and it was red and irritated.  Felt like she had something in it.  Irritation is fully resolved now.  Denies eye pain or significant vision changes.   Allergies  Allergen Reactions   Codeine     Patients states when she was young, she was told she "acted weird" after being given codeine    Outpatient Encounter Medications as of 04/05/2021  Medication Sig Note   Acetaminophen (TYLENOL) 325 MG CAPS Take by mouth.    acyclovir (ZOVIRAX) 400 MG tablet Take 1 tablet (400 mg total) by mouth 2 (two) times daily.    albuterol (VENTOLIN HFA) 108 (90 Base) MCG/ACT inhaler Inhale 2 puffs into the lungs every 6 (six) hours as needed for wheezing or shortness of breath.    aspirin EC 81 MG tablet Take 81 mg by mouth daily.    atorvastatin (LIPITOR) 20 MG tablet TAKE 1 TABLET BY MOUTH ONCE DAILY    BREO ELLIPTA 100-25 MCG/INH AEPB  INHALE 1 PUFF INTO THE LUNGS DAILY    cetirizine (ZYRTEC) 10 MG tablet Take 1 tablet (10 mg total) by mouth daily.    denosumab (PROLIA) 60 MG/ML SOSY injection Inject 60 mg into the skin every 6 (six) months.    gabapentin (NEURONTIN) 300 MG capsule Take 1 capsule (300 mg total) by mouth 2 (two) times daily.    glucose blood (ACCU-CHEK AVIVA PLUS) test strip Use to Monitor blood sugars daily    hydrochlorothiazide (MICROZIDE) 12.5 MG capsule Take 1 capsule (12.5 mg total) by mouth daily.    lidocaine-prilocaine (EMLA) cream Apply to affected area once    losartan  (COZAAR) 100 MG tablet Take 1 tablet (100 mg total) by mouth daily.    LUTEIN PO Take by mouth.    metoprolol succinate (TOPROL-XL) 50 MG 24 hr tablet Take with or immediately following a meal.    Multiple Vitamin (MULTIVITAMIN) capsule Take 1 capsule by mouth daily.    Multiple Vitamins-Minerals (HAIR SKIN AND NAILS FORMULA PO) Take by mouth.    Multiple Vitamins-Minerals (WOMENS DAILY FORMULA PO) Take by mouth.    NON FORMULARY Began again supplement    omeprazole (PRILOSEC) 20 MG capsule TAKE 1 CAPSULE BY MOUTH ONCE DAILY    ondansetron (ZOFRAN) 8 MG tablet Take 1 tablet (8 mg total) by mouth every 8 (eight) hours as needed.    prochlorperazine (COMPAZINE) 10 MG tablet Take 1 tablet (10 mg total) by mouth every 6 (six) hours as needed (Nausea or vomiting). 09/21/2020: Not started yet   Zinc Sulfate (ZINC-220 PO) Take by mouth.    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[place]) 08/25/2020   Osteoporosis 07/16/2019   Chronic left shoulder pain 07/16/2019   Fatty liver 06/19/2018   Pulmonary infiltrate 09/19/2016   Neutrophilia 08/18/2015   Insomnia 03/10/2015   Benign paroxysmal positional vertigo 10/28/2014   OA (osteoarthritis) of knee 09/17/2013   Pre-diabetes 09/17/2013   Peripheral neuropathy 09/17/2013   Asthma    Essential hypertension    GERD (gastroesophageal reflux disease)    Hyperlipidemia    Hilar lymphadenopathy 08/01/2011    Past Medical History:  Diagnosis Date   Abnormal glucose    Allergy    Asthma    Allergy induced   Benign  breast cyst in female    GERD (gastroesophageal reflux disease)    Hilar lymphadenopathy 08/01/2011   History of nuclear stress test 2009   Treadmill and Stress Myoview- no CAD   Hyperlipidemia    Hypertension    Lymphadenopathy of left cervical region 08/01/2011   Nephrolithiasis 1984   Osteopenia    PONV (postoperative nausea and vomiting)    Post-menopause    Pre-diabetes    borderline   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 Interim medical history since our last visit reviewed.  Review of Systems Per HPI unless specifically indicated above     Objective:    BP 138/70 (BP Location: Left Arm, Patient  Position: Sitting)   Pulse 62   Temp (!) 97.3 F (36.3 C) (Oral)   Resp 12   Wt 145 lb (65.8 kg)   SpO2 97%   BMI 25.69 kg/m   Wt Readings from Last 3 Encounters:  04/05/21 145 lb (65.8 kg)  04/02/21 147 lb (66.7 kg)  02/16/21 147 lb 6.4 oz (66.9 kg)    Physical Exam Vitals and nursing note reviewed.  Constitutional:      Appearance: She is normal weight. She is not toxic-appearing or diaphoretic.  Eyes:     General: No scleral icterus.       Right eye: No discharge.        Left eye: No discharge.     Extraocular Movements: Extraocular movements intact.     Right eye: Normal extraocular motion.     Left eye: Normal extraocular motion.     Pupils: Pupils are equal, round, and reactive to light.  Cardiovascular:     Rate and Rhythm: Normal rate and regular rhythm.     Heart sounds: Murmur heard.  Pulmonary:     Effort: Pulmonary effort is normal. No respiratory distress.     Breath sounds: Normal breath sounds. No wheezing, rhonchi or rales.  Musculoskeletal:     Cervical back: Normal range of motion.     Right lower leg: No edema.     Left lower leg: No edema.  Lymphadenopathy:     Cervical: No cervical adenopathy.  Skin:    General:  Skin is warm and dry.  Neurological:     Mental Status: She is alert and oriented to person, place, and time.  Psychiatric:        Mood and Affect: Mood normal.        Behavior: Behavior normal.        Thought Content: Thought content normal.        Judgment: Judgment normal.      Assessment & Plan:   Problem List Items Addressed This Visit       Cardiovascular and Mediastinum   Essential hypertension    Chronic.  BP is at goal today in clinic.  Recent electrolytes and kidney function good.  Continue current medications.  Follow up in 4 months.       Relevant Orders   Lipid panel     Nervous and Auditory   Peripheral neuropathy - Primary    Chronic.  She is tolerating gabapentin 300 mg as needed well.  We discussed increasing the frequency of this as needed.  Will check HgbA1c and Vitamin B12 level today although I do suspect the chemotherapy contributed.  Follow up in 4 months.       Relevant Orders   Vitamin B12     Other   Pre-diabetes    Chronic.  Last A1c improved to 6.0%, however patient a bit worried because she has a new sweet tooth.  Has been received some steroids related to lymphoma treatment.  Recheck HgbA1c today and treat as indicated.       Relevant Orders   Hemoglobin A1c   Hyperlipidemia    Chronic.  Check lipids today - patient is fasting.  LDL goal less than 100.  Continue atorvastatin 20 mg daily for now.  Recent liver function normal.  Follow up in 4 months.       Relevant Orders   Lipid panel   Other Visit Diagnoses     Eye irritation       Fluoroscien stain  applied, no corneal abrasions visualized.  Irritation is much improved, likely secondary to irritant.  Follow up with ophthmalogist as schedul        Follow up plan: Return in about 4 months (around 08/06/2021) for follow up.

## 2021-04-05 NOTE — Assessment & Plan Note (Signed)
Chronic.  She is tolerating gabapentin 300 mg as needed well.  We discussed increasing the frequency of this as needed.  Will check HgbA1c and Vitamin B12 level today although I do suspect the chemotherapy contributed.  Follow up in 4 months.

## 2021-04-05 NOTE — Assessment & Plan Note (Signed)
Chronic.  Last A1c improved to 6.0%, however patient a bit worried because she has a new sweet tooth.  Has been received some steroids related to lymphoma treatment.  Recheck HgbA1c today and treat as indicated.

## 2021-04-06 LAB — LIPID PANEL
Cholesterol: 132 mg/dL (ref <200)
HDL: 41 mg/dL — ABNORMAL LOW (ref >=50)
LDL Cholesterol (Calc): 67 mg/dL (calc)
Non-HDL Cholesterol (Calc): 91 mg/dL (calc) (ref <130)
Total CHOL/HDL Ratio: 3.2 (calc) (ref <5.0)
Triglycerides: 153 mg/dL — ABNORMAL HIGH (ref <150)

## 2021-04-06 LAB — HEMOGLOBIN A1C
Hgb A1c MFr Bld: 5.3 % of total Hgb (ref <5.7)
Mean Plasma Glucose: 105 mg/dL
eAG (mmol/L): 5.8 mmol/L

## 2021-04-06 LAB — VITAMIN B12: Vitamin B-12: 346 pg/mL (ref 200–1100)

## 2021-04-19 ENCOUNTER — Ambulatory Visit (INDEPENDENT_AMBULATORY_CARE_PROVIDER_SITE_OTHER): Payer: Medicare PPO | Admitting: *Deleted

## 2021-04-19 ENCOUNTER — Other Ambulatory Visit: Payer: Self-pay

## 2021-04-19 DIAGNOSIS — M8589 Other specified disorders of bone density and structure, multiple sites: Secondary | ICD-10-CM | POA: Diagnosis not present

## 2021-04-19 DIAGNOSIS — M810 Age-related osteoporosis without current pathological fracture: Secondary | ICD-10-CM

## 2021-05-13 DIAGNOSIS — H35362 Drusen (degenerative) of macula, left eye: Secondary | ICD-10-CM | POA: Diagnosis not present

## 2021-05-13 DIAGNOSIS — E113291 Type 2 diabetes mellitus with mild nonproliferative diabetic retinopathy without macular edema, right eye: Secondary | ICD-10-CM | POA: Diagnosis not present

## 2021-05-13 DIAGNOSIS — H538 Other visual disturbances: Secondary | ICD-10-CM | POA: Diagnosis not present

## 2021-05-13 DIAGNOSIS — H35033 Hypertensive retinopathy, bilateral: Secondary | ICD-10-CM | POA: Diagnosis not present

## 2021-05-13 LAB — HM DIABETES EYE EXAM

## 2021-05-14 ENCOUNTER — Other Ambulatory Visit: Payer: Self-pay | Admitting: Nurse Practitioner

## 2021-05-28 ENCOUNTER — Other Ambulatory Visit: Payer: Self-pay

## 2021-05-28 ENCOUNTER — Inpatient Hospital Stay: Payer: Medicare PPO | Admitting: Hematology and Oncology

## 2021-05-28 ENCOUNTER — Encounter: Payer: Self-pay | Admitting: Hematology and Oncology

## 2021-05-28 ENCOUNTER — Inpatient Hospital Stay: Payer: Medicare PPO | Attending: Hematology and Oncology

## 2021-05-28 VITALS — BP 135/65 | HR 71 | Temp 98.4°F | Resp 18 | Ht 63.0 in | Wt 139.4 lb

## 2021-05-28 DIAGNOSIS — C8118 Nodular sclerosis classical Hodgkin lymphoma, lymph nodes of multiple sites: Secondary | ICD-10-CM | POA: Diagnosis not present

## 2021-05-28 DIAGNOSIS — G629 Polyneuropathy, unspecified: Secondary | ICD-10-CM | POA: Insufficient documentation

## 2021-05-28 DIAGNOSIS — Z7951 Long term (current) use of inhaled steroids: Secondary | ICD-10-CM | POA: Insufficient documentation

## 2021-05-28 DIAGNOSIS — Z7982 Long term (current) use of aspirin: Secondary | ICD-10-CM | POA: Diagnosis not present

## 2021-05-28 DIAGNOSIS — Z79899 Other long term (current) drug therapy: Secondary | ICD-10-CM | POA: Diagnosis not present

## 2021-05-28 DIAGNOSIS — G609 Hereditary and idiopathic neuropathy, unspecified: Secondary | ICD-10-CM | POA: Diagnosis not present

## 2021-05-28 DIAGNOSIS — C8198 Hodgkin lymphoma, unspecified, lymph nodes of multiple sites: Secondary | ICD-10-CM | POA: Insufficient documentation

## 2021-05-28 DIAGNOSIS — R918 Other nonspecific abnormal finding of lung field: Secondary | ICD-10-CM

## 2021-05-28 DIAGNOSIS — Z9221 Personal history of antineoplastic chemotherapy: Secondary | ICD-10-CM | POA: Diagnosis not present

## 2021-05-28 LAB — CMP (CANCER CENTER ONLY)
ALT: 19 U/L (ref 0–44)
AST: 29 U/L (ref 15–41)
Albumin: 4.4 g/dL (ref 3.5–5.0)
Alkaline Phosphatase: 58 U/L (ref 38–126)
Anion gap: 11 (ref 5–15)
BUN: 17 mg/dL (ref 8–23)
CO2: 23 mmol/L (ref 22–32)
Calcium: 9 mg/dL (ref 8.9–10.3)
Chloride: 107 mmol/L (ref 98–111)
Creatinine: 0.81 mg/dL (ref 0.44–1.00)
GFR, Estimated: >60 mL/min (ref >60)
Glucose, Bld: 109 mg/dL — ABNORMAL HIGH (ref 70–99)
Potassium: 4.4 mmol/L (ref 3.5–5.1)
Sodium: 141 mmol/L (ref 135–145)
Total Bilirubin: 0.4 mg/dL (ref 0.3–1.2)
Total Protein: 7.7 g/dL (ref 6.5–8.1)

## 2021-05-28 LAB — CBC WITH DIFFERENTIAL (CANCER CENTER ONLY)
Abs Immature Granulocytes: 0.03 10*3/uL (ref 0.00–0.07)
Basophils Absolute: 0 10*3/uL (ref 0.0–0.1)
Basophils Relative: 0 %
Eosinophils Absolute: 0 10*3/uL (ref 0.0–0.5)
Eosinophils Relative: 0 %
HCT: 42.6 % (ref 36.0–46.0)
Hemoglobin: 13.5 g/dL (ref 12.0–15.0)
Immature Granulocytes: 0 %
Lymphocytes Relative: 14 %
Lymphs Abs: 1.3 10*3/uL (ref 0.7–4.0)
MCH: 28 pg (ref 26.0–34.0)
MCHC: 31.7 g/dL (ref 30.0–36.0)
MCV: 88.2 fL (ref 80.0–100.0)
Monocytes Absolute: 0.7 10*3/uL (ref 0.1–1.0)
Monocytes Relative: 8 %
Neutro Abs: 7.8 10*3/uL — ABNORMAL HIGH (ref 1.7–7.7)
Neutrophils Relative %: 78 %
Platelet Count: 245 10*3/uL (ref 150–400)
RBC: 4.83 MIL/uL (ref 3.87–5.11)
RDW: 13.4 % (ref 11.5–15.5)
WBC Count: 9.9 10*3/uL (ref 4.0–10.5)
nRBC: 0 % (ref 0.0–0.2)

## 2021-05-28 MED ORDER — HEPARIN SOD (PORK) LOCK FLUSH 100 UNIT/ML IV SOLN
500.0000 [IU] | Freq: Once | INTRAVENOUS | Status: AC
  Administered 2021-05-28: 500 [IU]

## 2021-05-28 MED ORDER — SODIUM CHLORIDE 0.9% FLUSH
10.0000 mL | Freq: Once | INTRAVENOUS | Status: AC
  Administered 2021-05-28: 10 mL

## 2021-05-28 NOTE — Progress Notes (Signed)
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 [facility]) Previous imaging study showed complete response to treatment except for some lung changes I suspect the lung nodules are not related to her disease Inflammatory changes or postinfectious changes are most likely the cause of those lung changes Plan to repeat noncontrast CT scan in January 2023   Peripheral neuropathy She denies worsening peripheral neuropathy She tolerated gabapentin well We will continue the same  Orders Placed This Encounter  Procedures   CT CHEST W CONTRAST    Standing Status:   Future    Standing Expiration Date:   05/28/2022    Order Specific Question:   If indicated for the ordered procedure, I authorize the administration of contrast media per Radiology protocol    Answer:   Yes    Order Specific Question:   Preferred imaging location?    Answer:   [facility]    Order Specific Question:   Radiology Contrast Protocol - do NOT remove file path    Answer:   [email or web address]    All questions were answered. The patient knows to call the clinic with any problems, questions or concerns. The total time spent in the appointment was 20 minutes encounter with patients including review of chart and various tests results, discussions about plan of care and coordination of care plan   Heath Lark, MD 05/28/2021 6:03 PM  INTERVAL HISTORY: Please see below for problem oriented charting. she returns for surveillance follow-up for history of Hodgkin lymphoma status postchemotherapy She is doing well No recent infection No new lymphadenopathy No worsening peripheral neuropathy  REVIEW OF  SYSTEMS:   Constitutional: Denies fevers, chills or abnormal weight loss Eyes: Denies blurriness of vision Ears, nose, mouth, throat, and face: Denies mucositis or sore throat Respiratory: Denies cough, dyspnea or wheezes Cardiovascular: Denies palpitation, chest discomfort or lower extremity swelling Gastrointestinal:  Denies nausea, heartburn or change in bowel habits Skin: Denies abnormal skin rashes Lymphatics: Denies new lymphadenopathy or easy bruising Neurological:Denies numbness, tingling or new weaknesses Behavioral/Psych: Mood is stable, no new changes  All other systems were reviewed with the patient and are negative.  I have reviewed the past medical history, past surgical history, social history and family history with the patient and they are unchanged from previous note.  ALLERGIES:  is allergic to codeine.  MEDICATIONS:  Current Outpatient Medications  Medication Sig Dispense Refill   Acetaminophen (TYLENOL) 325 MG CAPS Take by mouth.     acyclovir (ZOVIRAX) 400 MG tablet Take 1 tablet (400 mg total) by mouth 2 (two) times daily. 60 tablet 6   albuterol (VENTOLIN HFA) 108 (90 Base) MCG/ACT inhaler Inhale 2 puffs into the lungs every 6 (six) hours as needed for wheezing or shortness of breath. 8 g 0   aspirin EC 81 MG tablet Take 81 mg by mouth daily.     atorvastatin (LIPITOR) 20 MG tablet TAKE 1 TABLET BY MOUTH ONCE DAILY 90 tablet 3   BREO ELLIPTA 100-25 MCG/INH AEPB INHALE 1 PUFF INTO THE LUNGS DAILY 60 each 5   cetirizine (ZYRTEC) 10 MG tablet Take 1 tablet (10 mg total) by mouth daily. 90 tablet 2   denosumab (PROLIA) 60 MG/ML SOSY injection Inject 60 mg into  the skin every 6 (six) months.     gabapentin (NEURONTIN) 300 MG capsule Take 1 capsule (300 mg total) by mouth 2 (two) times daily. 60 capsule 1   glucose blood (ACCU-CHEK AVIVA PLUS) test strip Use to Monitor blood sugars daily 100 each 5   hydrochlorothiazide (MICROZIDE) 12.5 MG capsule Take 1 capsule (12.5  mg total) by mouth daily. 90 capsule 3   lidocaine-prilocaine (EMLA) cream Apply to affected area once 30 g 3   losartan (COZAAR) 100 MG tablet Take 1 tablet (100 mg total) by mouth daily. 90 tablet 2   LUTEIN PO Take by mouth.     metoprolol succinate (TOPROL-XL) 50 MG 24 hr tablet Take with or immediately following a meal. 90 tablet 2   Multiple Vitamin (MULTIVITAMIN) capsule Take 1 capsule by mouth daily.     Multiple Vitamins-Minerals (HAIR SKIN AND NAILS FORMULA PO) Take by mouth.     Multiple Vitamins-Minerals (WOMENS DAILY FORMULA PO) Take by mouth.     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 Extensive hypermetabolic lymphadenopathy involving the neck, chest and abdomen as detailed above (Deauville 5). I do not see any pelvic disease. 2. Possible involvement of the spleen. 3. Osseous involvement is also noted.     09/03/2020 Pathology Results   A. LYMPH NODE, LEFT NECK, EXCISION:  -Lymphoproliferative disorder consistent with classical Hodgkin lymphoma -See comment   COMMENT:   The sections show effacement of the lymph nodal architecture by a  primarily nodular lymphoproliferative process.  In the more cellular areas, the nodules are composed of a mixture of small lymphocytes, plasma cells, abundant histiocytes in addition to atypical large mononuclear and multi-lobated lymphoid appearing cells with variably prominent nucleoli.  The nodules are surrounded by dense collagenous fibrosis.  In the less cellular areas, there appears to be a fibroblastic proliferation and a much more depleted cellular appearance but scattering of large atypical lymphoid appearing cells is still seen.   Flow cytometric analysis was performed [facility] [phone]) and shows predominance of T lymphocytes with nonspecific changes including relative abundance of CD8 positive cells and reversal of the CD4: CD8 ratio.  No monoclonal B-cell population identified.  A battery of immunohistochemical stains was performed and shows that the large atypical lymphoid appearing cells are positive for CD30, Mum-1, PAX 5 (weak), CD23 in addition to variable but generally weak positivity for CD79a and CD20.  Only scattered large atypical lymphoid cells show weak Golgi positivity for CD15.  The large atypical lymphoid cells are negative for LCA, CD10, CD3, CD5, EMA, ALK protein and EBV in situ  hybridization.  The small lymphoid cells in the background show a mixture  of T and B cells with predominance of T cells.  T cells show a mixture of CD4 and CD8 positive cells with slight predominance of the latter.  The overall findings are most consistent with classical Hodgkin lymphoma, nodular sclerosis subtype.      09/21/2020 Procedure   Placement of a subcutaneous port device. Catheter tip at the superior cavoatrial junction.   09/25/2020 Echocardiogram   1. Left ventricular ejection fraction, by estimation, is 60 to 65%. The left ventricle has normal function. The left ventricle has no regional wall motion abnormalities. There is mild concentric left ventricular hypertrophy. Left ventricular  diastolic parameters are consistent with Grade I diastolic dysfunction (impaired relaxation). The average left ventricular global longitudinal strain is -19.6 %. The global longitudinal strain is normal.  2. Right ventricular systolic function is normal. The right ventricular size is normal.  3. A small pericardial effusion is present. The pericardial effusion is surrounding the apex and anterior to the right ventricle. There is no evidence of cardiac tamponade.  4. The mitral valve is normal in structure. No evidence of mitral valve regurgitation.  5. The aortic valve is tricuspid. Aortic valve regurgitation is not visualized. Mild aortic valve sclerosis is present, with no evidence of aortic valve stenosis.  6. The inferior vena cava is normal in size with greater than 50% respiratory variability, suggesting right atrial pressure of 3 mmHg.    Chemotherapy    Patient is on Treatment Plan: HODGKINS LYMPHOMA  A + AVD Q28D       11/23/2020 PET scan   1. Interval significant response to therapy. The previously demonstrated hypermetabolic adenopathy in the neck, chest and abdomen has nearly completely resolved. Deauville 1 and 2. Slightly greater residual metabolic activity within hilar lymph nodes is less specific and may be reactive. 2. Resolution of previously demonstrated focal hypermetabolic activity in the spleen and bones. Diffuse bone marrow activity attributed to treatment response. 3. Stable incidental findings as detailed above.   01/11/2021 Echocardiogram    1. Stable EF and GLS compared to 09/25/20. Left ventricular ejection fraction, by estimation, is 60 to 65%. The left ventricle has normal function. The left ventricle has no regional wall motion abnormalities. Left ventricular diastolic parameters are consistent with Grade I diastolic dysfunction (impaired relaxation). The average left ventricular global longitudinal strain is -19.7 %. The global longitudinal strain is normal.  2. Right  ventricular systolic function is normal. The right ventricular size is normal.  3. Left atrial size was mildly dilated.  4. The mitral valve is abnormal. Trivial mitral valve regurgitation. No evidence of mitral stenosis. Moderate mitral annular calcification.  5. The aortic valve is calcified. There is moderate calcification of the aortic valve. Aortic valve regurgitation is not visualized. Mild to moderate aortic valve sclerosis/calcification is present, without any evidence of aortic stenosis.  6. The inferior vena cava is normal in size with greater than 50% respiratory variability, suggesting right atrial pressure of 3 mmHg.     04/01/2021 PET scan   Resolution of bilateral hilar activity and decreased size of LEFT retroperitoneal lymph node with stable SUV value less than mediastinal blood pool.   Interval development of pulmonary nodules with increased metabolic activity. Findings are nonspecific. Nodule in the RIGHT upper lobe has a branching pattern that could be seen in the setting of infectious or inflammatory changes. This is considered due to relatively rapid development. There is however moderate, relatively higher uptake in the LEFT lower lobe raising the question of neoplasm  including lymphoma.   Three-vessel coronary artery disease.   Aortic Atherosclerosis (ICD10-I70.0).     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[place])  09/14/2020 Initial Diagnosis   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[place])   09/29/2020 -  Chemotherapy    Patient is on Treatment Plan: HODGKINS LYMPHOMA  A + AVD Q28D         PHYSICAL EXAMINATION: ECOG PERFORMANCE STATUS: 0 - Asymptomatic  Vitals:   05/28/21 0801  BP: 135/65  Pulse: 71  Resp: 18  Temp: 98.4 F (36.9 C)  SpO2: 97%   Filed Weights   05/28/21 0801  Weight: 139 lb 6.4 oz (63.2 kg)    GENERAL:alert, no distress and comfortable SKIN: skin color, texture, turgor are normal, no rashes or significant lesions EYES: normal, Conjunctiva  are pink and non-injected, sclera clear OROPHARYNX:no exudate, no erythema and lips, buccal mucosa, and tongue normal  NECK: supple, thyroid normal size, non-tender, without nodularity LYMPH:  no palpable lymphadenopathy in the cervical, axillary or inguinal LUNGS: clear to auscultation and percussion with normal breathing effort HEART: regular rate & rhythm and no murmurs and no lower extremity edema ABDOMEN:abdomen soft, non-tender and normal bowel sounds Musculoskeletal:no cyanosis of digits and no clubbing  NEURO: alert & oriented x 3 with fluent speech, no focal motor/sensory deficits  LABORATORY DATA:  I have reviewed the data as listed    Component Value Date/Time   NA 141 05/28/2021 0748   NA 143 08/15/2016 1037   K 4.4 05/28/2021 0748   K 4.8 08/15/2016 1037   CL 107 05/28/2021 0748   CL 103 07/16/2012 1304   CO2 23 05/28/2021 0748   CO2 27 08/15/2016 1037   GLUCOSE 109 (H) 05/28/2021 0748   GLUCOSE 111 08/15/2016 1037   GLUCOSE 112 (H) 07/16/2012 1304   BUN 17 05/28/2021 0748   BUN 12.7 08/15/2016 1037   CREATININE 0.81 05/28/2021 0748   CREATININE 0.95 (H) 08/10/2020 1041   CREATININE 0.9 08/15/2016 1037   CALCIUM 9.0 05/28/2021 0748   CALCIUM 10.3 08/13/2020 1117   CALCIUM 10.5 (H) 08/15/2016 1037   PROT 7.7 05/28/2021 0748   PROT 7.9 08/15/2016 1037   ALBUMIN 4.4 05/28/2021 0748   ALBUMIN 4.5 08/15/2016 1037   AST 29 05/28/2021 0748   AST 30 08/15/2016 1037   ALT 19 05/28/2021 0748   ALT 29 08/15/2016 1037   ALKPHOS 58 05/28/2021 0748   ALKPHOS 67 08/15/2016 1037   BILITOT 0.4 05/28/2021 0748   BILITOT 0.80 08/15/2016 1037   GFRNONAA >60 05/28/2021 0748   GFRNONAA 62 12/05/2017 1000   GFRAA 72 12/05/2017 1000    No results found for: SPEP, UPEP  Lab Results  Component Value Date   WBC 9.9 05/28/2021   NEUTROABS 7.8 (H) 05/28/2021   HGB 13.5 05/28/2021   HCT 42.6 05/28/2021   MCV 88.2 05/28/2021   PLT 245 05/28/2021      Chemistry       Component Value Date/Time   NA 141 05/28/2021 0748   NA 143 08/15/2016 1037   K 4.4 05/28/2021 0748   K 4.8 08/15/2016 1037   CL 107 05/28/2021 0748   CL 103 07/16/2012 1304   CO2 23 05/28/2021 0748   CO2 27 08/15/2016 1037   BUN 17 05/28/2021 0748   BUN 12.7 08/15/2016 1037   CREATININE 0.81 05/28/2021 0748   CREATININE 0.95 (H) 08/10/2020 1041   CREATININE 0.9 08/15/2016 1037      Component Value Date/Time   CALCIUM 9.0  05/28/2021 0748   CALCIUM 10.3 08/13/2020 1117   CALCIUM 10.5 (H) 08/15/2016 1037   ALKPHOS 58 05/28/2021 0748   ALKPHOS 67 08/15/2016 1037   AST 29 05/28/2021 0748   AST 30 08/15/2016 1037   ALT 19 05/28/2021 0748   ALT 29 08/15/2016 1037   BILITOT 0.4 05/28/2021 0748   BILITOT 0.80 08/15/2016 1037

## 2021-05-28 NOTE — Assessment & Plan Note (Signed)
She denies worsening peripheral neuropathy She tolerated gabapentin well We will continue the same

## 2021-05-28 NOTE — Assessment & Plan Note (Signed)
Previous imaging study showed complete response to treatment except for some lung changes I suspect the lung nodules are not related to her disease Inflammatory changes or postinfectious changes are most likely the cause of those lung changes Plan to repeat noncontrast CT scan in January 2023

## 2021-05-31 ENCOUNTER — Encounter: Payer: Self-pay | Admitting: Hematology and Oncology

## 2021-06-02 ENCOUNTER — Other Ambulatory Visit: Payer: Self-pay

## 2021-06-02 DIAGNOSIS — C44519 Basal cell carcinoma of skin of other part of trunk: Secondary | ICD-10-CM | POA: Diagnosis not present

## 2021-06-02 DIAGNOSIS — L821 Other seborrheic keratosis: Secondary | ICD-10-CM | POA: Diagnosis not present

## 2021-06-02 DIAGNOSIS — L820 Inflamed seborrheic keratosis: Secondary | ICD-10-CM | POA: Diagnosis not present

## 2021-06-02 DIAGNOSIS — L814 Other melanin hyperpigmentation: Secondary | ICD-10-CM | POA: Diagnosis not present

## 2021-06-02 DIAGNOSIS — D2262 Melanocytic nevi of left upper limb, including shoulder: Secondary | ICD-10-CM | POA: Diagnosis not present

## 2021-06-02 DIAGNOSIS — D1801 Hemangioma of skin and subcutaneous tissue: Secondary | ICD-10-CM | POA: Diagnosis not present

## 2021-06-02 DIAGNOSIS — Z85828 Personal history of other malignant neoplasm of skin: Secondary | ICD-10-CM | POA: Diagnosis not present

## 2021-06-02 MED ORDER — OMEPRAZOLE 20 MG PO CPDR: DELAYED_RELEASE_CAPSULE | ORAL | 3 refills | Status: DC

## 2021-06-03 ENCOUNTER — Other Ambulatory Visit: Payer: Medicare PPO

## 2021-06-15 NOTE — Progress Notes (Signed)
Cardiology Office Note   Date:  06/28/2021   ID:  Yentl, Verge 1946/03/19, MRN [id number]  PCP:  Eulogio Bear, NP  Cardiologist:   Jenkins Rouge, MD   No chief complaint on file.      History of Present Illness:  76 y.o. first seen 08/09/16 for chest pain. Atypical with GI overtones GERD CRF;s HTN DM-2 Had f/u myovue 08/16/16 normal no ischemia EF 60%   Has had adenopathy in neck chest and upper abdomen with biopsy showing atypical lymphoid hyperplasia.  Sees oncology [name] for this.  Adenopathy progressive by CT and had left neck biopsy by Dr [name] 09/03/20 PET scan with extensive hypermetabolic lymphadenopathy involving neck chest and abdomen  Biopsy with lymphoma Seeing Dr [name] diagnosed with Hodgkins lymphoma nodular sclerosis  Initial Rx complicated by neuropathy had nice response by f/u PET 11/23/20   TTE 09/25/20 EF 60-65% strain -19.6  TTE 01/08/21 EF 60-65% strain -19.7   She is sedentary and eats too much Son MD in [place] has a 67 yo and 62yo grand-children  No cardiac complaints   Past Medical History:  Diagnosis Date   Abnormal glucose    Allergy    Asthma    Allergy induced   Benign breast cyst in female    GERD (gastroesophageal reflux disease)    Hilar lymphadenopathy 08/01/2011   History of nuclear stress test 2009   Treadmill and Stress Myoview- no CAD   Hyperlipidemia    Hypertension    Lymphadenopathy of left cervical region 08/01/2011   Nephrolithiasis 1984   Osteopenia    PONV (postoperative nausea and vomiting)    Post-menopause    Pre-diabetes    borderline   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    Grade 2   Vision changes     Past Surgical History:  Procedure Laterality Date   CHOLECYSTECTOMY     IR IMAGING GUIDED PORT INSERTION  09/21/2020   LITHOTRIPSY  1984   LYMPH NODE BIOPSY     MASS BIOPSY Left 09/03/2020   Procedure: LEFT NECK JUGULAR NODE;  Surgeon: Rozetta Nunnery, MD;  Location: [facility];  Service: ENT;  Laterality: Left;   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   Procedure: LEFT TOTAL KNEE ARTHROPLASTY;  Surgeon: Gaynelle Arabian, MD;  Location: [facility];  Service: Orthopedics;  Laterality: Left;     Current Outpatient Medications  Medication Sig Dispense Refill   Acetaminophen (TYLENOL) 325 MG CAPS Take by mouth.     acyclovir (ZOVIRAX) 400 MG tablet Take 1 tablet (400 mg total) by mouth 2 (two) times daily. 60 tablet 6   albuterol (VENTOLIN HFA) 108 (90 Base) MCG/ACT inhaler Inhale 2 puffs into the lungs every 6 (six) hours as needed for wheezing or shortness of breath. 8 g 0   aspirin EC 81 MG tablet Take 81 mg by mouth daily.     atorvastatin (LIPITOR) 20 MG tablet TAKE 1 TABLET BY MOUTH ONCE DAILY 90 tablet 3   BREO ELLIPTA 100-25 MCG/INH AEPB INHALE 1 PUFF INTO THE LUNGS DAILY 60 each 5   cetirizine (ZYRTEC) 10 MG tablet Take 1 tablet (10 mg total) by mouth daily. 90 tablet 2   denosumab (PROLIA) 60 MG/ML SOSY injection Inject 60 mg into the skin every 6 (six) months.     gabapentin (NEURONTIN) 300 MG capsule Take 1 capsule (300 mg total) by mouth 2 (two) times daily. (Patient taking differently: Take 300 mg by mouth  as needed.) 60 capsule 1   glucose blood (ACCU-CHEK AVIVA PLUS) test strip Use to Monitor blood sugars daily 100 each 5   hydrochlorothiazide (MICROZIDE) 12.5 MG capsule Take 1 capsule (12.5 mg total) by mouth daily. 90 capsule 3   lidocaine-prilocaine (EMLA) cream Apply to affected area once 30 g 3   losartan (COZAAR) 100 MG tablet Take 1 tablet (100 mg total) by mouth daily. 90 tablet 2   LUTEIN PO Take by mouth.     metoprolol succinate (TOPROL-XL) 50 MG 24 hr tablet Take with or immediately following a meal. 90 tablet 2   Multiple Vitamin (MULTIVITAMIN) capsule Take 1 capsule by mouth daily.     Multiple Vitamins-Minerals (HAIR SKIN AND NAILS FORMULA PO) Take by mouth.     Multiple Vitamins-Minerals (WOMENS DAILY FORMULA PO) Take by mouth.     NON  FORMULARY Began again supplement     omeprazole (PRILOSEC) 20 MG capsule TAKE 1 CAPSULE BY MOUTH ONCE DAILY 90 capsule 3   Current Facility-Administered Medications  Medication Dose Route Frequency Provider Last Rate Last Admin   denosumab (PROLIA) injection 60 mg  60 mg Subcutaneous Q6 months Buelah Manis, Kawanta F, MD   60 mg at 04/19/21 1607    Allergies:   Codeine    Social History:  The patient  reports that she has never smoked. She has never used smokeless tobacco. She reports that she does not drink alcohol and does not use drugs.   Family History:  The patient's family history includes Alcohol abuse in her brother; Asthma in her sister; Breast cancer in her cousin; Cancer in her father, maternal aunt, and maternal aunt; Dementia in her mother; Diabetes in her brother; Heart attack in her mother; Hypertension in her mother; Stroke in her mother.    ROS:  Please see the history of present illness.   Otherwise, review of systems are positive for none.   All other systems are reviewed and negative.    PHYSICAL EXAM: VS:  BP 124/62    Pulse 74    Ht 5\' 3"  (1.6 m)    Wt 136 lb (61.7 kg)    SpO2 97%    BMI 24.09 kg/m  , BMI Body mass index is 24.09 kg/m. Affect appropriate Healthy:  appears stated age 71: left neck adenopathy post biopsy  Neck supple with cervical lymphadenopathy  JVP normal no bruits no thyromegaly Lungs clear with no wheezing and good diaphragmatic motion Heart:  S1/S2 no murmur, no rub, gallop or click PMI normal Abdomen: benighn, BS positve, no tenderness, no AAA no bruit.  No HSM or HJR Distal pulses intact with no bruits Left LLE  Edema post left TKR  Neuro non-focal Skin warm and dry No muscular weakness   EKG:   09/03/19 SR rate 75 normal   Recent Labs: 05/28/2021: ALT 19; BUN 17; Creatinine 0.81; Hemoglobin 13.5; Platelet Count 245; Potassium 4.4; Sodium 141    Lipid Panel    Component Value Date/Time   CHOL 132 04/05/2021 0838   TRIG 153  (H) 04/05/2021 0838   HDL 41 (L) 04/05/2021 0838   CHOLHDL 3.2 04/05/2021 0838   VLDL 34 (H) 12/06/2016 0932   LDLCALC 67 04/05/2021 0838      Wt Readings from Last 3 Encounters:  06/28/21 136 lb (61.7 kg)  05/28/21 139 lb 6.4 oz (63.2 kg)  04/05/21 145 lb (65.8 kg)      Other studies Reviewed: Additional studies/ records that were reviewed today include:  notes from Dr Dennard Schaumann [facility]TE 01/08/21 myovue 08/16/16    ASSESSMENT AND PLAN:  1. Chest Pain atypical normal myovue 08/16/16 observe  2. GERD: continue H2 blocker low carb diet avoid excess NSAI's 3. DM  Discussed low carb diet.  Target hemoglobin A1c is 6.5 or less.  Continue current medications. 4. Hodgkins Lymphoma:  nodular sclerosis post chemo f/u [name] EF/strain  Normal by echo 01/01/21 f/u CT per oncology latter this month  5. HTN:  improved with addition of diuretic  6. Lipids:  On statin LDL at goal with no documented vascular disease  7. 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

## 2021-06-25 NOTE — Progress Notes (Signed)
Prolia has been approved 06/13/2021-06/12/2022. Per [employer]

## 2021-06-28 ENCOUNTER — Encounter: Payer: Self-pay | Admitting: Cardiovascular Disease

## 2021-06-28 ENCOUNTER — Other Ambulatory Visit: Payer: Self-pay

## 2021-06-28 ENCOUNTER — Ambulatory Visit: Payer: Medicare PPO | Admitting: Cardiovascular Disease

## 2021-06-28 VITALS — BP 124/62 | HR 74 | Ht 63.0 in | Wt 136.0 lb

## 2021-06-28 DIAGNOSIS — E782 Mixed hyperlipidemia: Secondary | ICD-10-CM | POA: Diagnosis not present

## 2021-06-28 DIAGNOSIS — I10 Essential (primary) hypertension: Secondary | ICD-10-CM

## 2021-06-28 DIAGNOSIS — C8112 Nodular sclerosis classical Hodgkin lymphoma, intrathoracic lymph nodes: Secondary | ICD-10-CM

## 2021-06-28 DIAGNOSIS — R079 Chest pain, unspecified: Secondary | ICD-10-CM

## 2021-06-28 NOTE — Patient Instructions (Signed)
Medication Instructions:  °*If you need a refill on your cardiac medications before your next appointment, please call your pharmacy* ° °Lab Work: °If you have labs (blood work) drawn today and your tests are completely normal, you will receive your results only by: °MyChart Message (if you have MyChart) OR °A paper copy in the mail °If you have any lab test that is abnormal or we need to change your treatment, we will call you to review the results. ° °Testing/Procedures: °None ordered today. ° °Follow-Up: °At [facility], you and your health needs are our priority.  As part of our continuing mission to provide you with exceptional heart care, we have created designated Provider Care Teams.  These Care Teams include your primary Cardiologist (physician) and Advanced Practice Providers (APPs -  Physician Assistants and Nurse Practitioners) who all work together to provide you with the care you need, when you need it. ° °We recommend signing up for the patient portal called "MyChart".  Sign up information is provided on this After Visit Summary.  MyChart is used to connect with patients for Virtual Visits (Telemedicine).  Patients are able to view lab/test results, encounter notes, upcoming appointments, etc.  Non-urgent messages can be sent to your provider as well.   °To learn more about what you can do with MyChart, go to [email or web address].   ° °Your next appointment:   °1 year(s) ° °The format for your next appointment:   °In Person ° °Provider:   °Peter Nishan, MD { ° °

## 2021-07-01 ENCOUNTER — Ambulatory Visit (HOSPITAL_COMMUNITY)
Admission: RE | Admit: 2021-07-01 | Discharge: 2021-07-01 | Disposition: A | Discharge status: Home / Self Care | Payer: Medicare PPO | Source: Ambulatory Visit | Attending: Hematology and Oncology | Admitting: Hematology and Oncology

## 2021-07-01 ENCOUNTER — Other Ambulatory Visit: Payer: Self-pay

## 2021-07-01 ENCOUNTER — Inpatient Hospital Stay: Payer: Medicare PPO | Attending: Hematology and Oncology

## 2021-07-01 DIAGNOSIS — C8118 Nodular sclerosis classical Hodgkin lymphoma, lymph nodes of multiple sites: Secondary | ICD-10-CM | POA: Diagnosis not present

## 2021-07-01 DIAGNOSIS — R232 Flushing: Secondary | ICD-10-CM | POA: Insufficient documentation

## 2021-07-01 DIAGNOSIS — Z7982 Long term (current) use of aspirin: Secondary | ICD-10-CM | POA: Insufficient documentation

## 2021-07-01 DIAGNOSIS — I700 Atherosclerosis of aorta: Secondary | ICD-10-CM | POA: Diagnosis not present

## 2021-07-01 DIAGNOSIS — R918 Other nonspecific abnormal finding of lung field: Secondary | ICD-10-CM | POA: Insufficient documentation

## 2021-07-01 DIAGNOSIS — C8198 Hodgkin lymphoma, unspecified, lymph nodes of multiple sites: Secondary | ICD-10-CM | POA: Insufficient documentation

## 2021-07-01 DIAGNOSIS — Z79899 Other long term (current) drug therapy: Secondary | ICD-10-CM | POA: Insufficient documentation

## 2021-07-01 LAB — CMP (CANCER CENTER ONLY)
ALT: 16 U/L (ref 0–44)
AST: 21 U/L (ref 15–41)
Albumin: 4.4 g/dL (ref 3.5–5.0)
Alkaline Phosphatase: 40 U/L (ref 38–126)
Anion gap: 7 (ref 5–15)
BUN: 13 mg/dL (ref 8–23)
CO2: 28 mmol/L (ref 22–32)
Calcium: 9.1 mg/dL (ref 8.9–10.3)
Chloride: 104 mmol/L (ref 98–111)
Creatinine: 0.73 mg/dL (ref 0.44–1.00)
GFR, Estimated: >60 mL/min (ref >60)
Glucose, Bld: 117 mg/dL — ABNORMAL HIGH (ref 70–99)
Potassium: 4.1 mmol/L (ref 3.5–5.1)
Sodium: 139 mmol/L (ref 135–145)
Total Bilirubin: 0.5 mg/dL (ref 0.3–1.2)
Total Protein: 6.9 g/dL (ref 6.5–8.1)

## 2021-07-01 LAB — CBC WITH DIFFERENTIAL (CANCER CENTER ONLY)
Abs Immature Granulocytes: 0.03 10*3/uL (ref 0.00–0.07)
Basophils Absolute: 0.1 10*3/uL (ref 0.0–0.1)
Basophils Relative: 1 %
Eosinophils Absolute: 0.1 10*3/uL (ref 0.0–0.5)
Eosinophils Relative: 1 %
HCT: 41 % (ref 36.0–46.0)
Hemoglobin: 13.2 g/dL (ref 12.0–15.0)
Immature Granulocytes: 0 %
Lymphocytes Relative: 20 %
Lymphs Abs: 1.7 10*3/uL (ref 0.7–4.0)
MCH: 28.2 pg (ref 26.0–34.0)
MCHC: 32.2 g/dL (ref 30.0–36.0)
MCV: 87.6 fL (ref 80.0–100.0)
Monocytes Absolute: 0.6 10*3/uL (ref 0.1–1.0)
Monocytes Relative: 6 %
Neutro Abs: 6.2 10*3/uL (ref 1.7–7.7)
Neutrophils Relative %: 72 %
Platelet Count: 223 10*3/uL (ref 150–400)
RBC: 4.68 MIL/uL (ref 3.87–5.11)
RDW: 13.5 % (ref 11.5–15.5)
WBC Count: 8.6 10*3/uL (ref 4.0–10.5)
nRBC: 0 % (ref 0.0–0.2)

## 2021-07-01 MED ORDER — SODIUM CHLORIDE 0.9% FLUSH
10.0000 mL | Freq: Once | INTRAVENOUS | Status: AC
  Administered 2021-07-01: 10 mL

## 2021-07-01 MED ORDER — IOHEXOL 300 MG/ML  SOLN
75.0000 mL | Freq: Once | INTRAMUSCULAR | Status: AC | PRN
  Administered 2021-07-01: 75 mL via INTRAVENOUS

## 2021-07-01 MED ORDER — SODIUM CHLORIDE (PF) 0.9 % IJ SOLN
INTRAMUSCULAR | Status: AC
  Filled 2021-07-01: qty 50

## 2021-07-01 MED ORDER — HEPARIN SOD (PORK) LOCK FLUSH 100 UNIT/ML IV SOLN: 500.0000 [IU] | Freq: Once | INTRAVENOUS | Status: DC

## 2021-07-01 MED ORDER — HEPARIN SOD (PORK) LOCK FLUSH 100 UNIT/ML IV SOLN
INTRAVENOUS | Status: AC
  Administered 2021-07-01: 500 [IU]
  Filled 2021-07-01: qty 5

## 2021-07-05 ENCOUNTER — Other Ambulatory Visit: Payer: Self-pay

## 2021-07-05 ENCOUNTER — Encounter: Payer: Self-pay | Admitting: Hematology and Oncology

## 2021-07-05 ENCOUNTER — Inpatient Hospital Stay: Payer: Medicare PPO | Admitting: Hematology and Oncology

## 2021-07-05 VITALS — BP 136/61 | HR 93 | Temp 98.3°F | Resp 18 | Ht 63.0 in | Wt 138.2 lb

## 2021-07-05 DIAGNOSIS — C8198 Hodgkin lymphoma, unspecified, lymph nodes of multiple sites: Secondary | ICD-10-CM | POA: Diagnosis not present

## 2021-07-05 DIAGNOSIS — R232 Flushing: Secondary | ICD-10-CM | POA: Diagnosis not present

## 2021-07-05 DIAGNOSIS — I700 Atherosclerosis of aorta: Secondary | ICD-10-CM | POA: Diagnosis not present

## 2021-07-05 DIAGNOSIS — C8118 Nodular sclerosis classical Hodgkin lymphoma, lymph nodes of multiple sites: Secondary | ICD-10-CM | POA: Diagnosis not present

## 2021-07-05 DIAGNOSIS — R918 Other nonspecific abnormal finding of lung field: Secondary | ICD-10-CM | POA: Diagnosis not present

## 2021-07-05 DIAGNOSIS — Z7982 Long term (current) use of aspirin: Secondary | ICD-10-CM | POA: Diagnosis not present

## 2021-07-05 DIAGNOSIS — Z79899 Other long term (current) drug therapy: Secondary | ICD-10-CM | POA: Diagnosis not present

## 2021-07-05 NOTE — Assessment & Plan Note (Signed)
Previous imaging study showed complete response to treatment except for some lung changes I suspect the lung nodules are not related to her disease We reviewed her CT imaging today which showed improvement I plan to repeat imaging study with blood work again in 6 months

## 2021-07-05 NOTE — Assessment & Plan Note (Signed)
These are nonspecific but overall improved The patient is reassured She is not symptomatic I plan to repeat imaging study and blood work in 6 months and she is in agreement I will schedule port flushes in the meantime

## 2021-07-05 NOTE — Progress Notes (Signed)
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 [facility]) Previous imaging study showed complete response to treatment except for some lung changes I suspect the lung nodules are not related to her disease We reviewed her CT imaging today which showed improvement I plan to repeat imaging study with blood work again in 6 months  Pulmonary infiltrate These are nonspecific but overall improved The patient is reassured She is not symptomatic I plan to repeat imaging study and blood work in 6 months and she is in agreement I will schedule port flushes in the meantime  Orders Placed This Encounter  Procedures   CT CHEST ABDOMEN PELVIS W CONTRAST    Standing Status:   Future    Standing Expiration Date:   07/05/2022    Order Specific Question:   Preferred imaging location?    Answer:   [facility]    Order Specific Question:   Radiology Contrast Protocol - do NOT remove file path    Answer:   [email or web address]   Lactate dehydrogenase    Standing Status:   Standing    Number of Occurrences:   2    Standing Expiration Date:   07/05/2022    All questions were answered. The patient knows to call the clinic with any problems, questions or concerns. The total time spent in the appointment was 25 minutes encounter with patients including review of chart and various tests results, discussions about plan of care and coordination of care plan   Heath Lark, MD 07/05/2021 10:02 AM  INTERVAL HISTORY: Please see below for problem oriented charting. she returns for treatment follow-up She is feeling well No recent cough, chest pain or shortness of breath Denies flare of  neuropathy  REVIEW OF SYSTEMS:   Constitutional: Denies fevers, chills or abnormal weight loss Eyes: Denies blurriness of vision Ears, nose, mouth, throat, and face: Denies mucositis or sore throat Respiratory: Denies cough, dyspnea or wheezes Cardiovascular: Denies palpitation, chest discomfort or lower extremity swelling Gastrointestinal:  Denies nausea, heartburn or change in bowel habits Skin: Denies abnormal skin rashes Lymphatics: Denies new lymphadenopathy or easy bruising Neurological:Denies numbness, tingling or new weaknesses Behavioral/Psych: Mood is stable, no new changes  All other systems were reviewed with the patient and are negative.  I have reviewed the past medical history, past surgical history, social history and family history with the patient and they are unchanged from previous note.  ALLERGIES:  is allergic to codeine.  MEDICATIONS:  Current Outpatient Medications  Medication Sig Dispense Refill   Acetaminophen (TYLENOL) 325 MG CAPS Take by mouth.     acyclovir (ZOVIRAX) 400 MG tablet Take 1 tablet (400 mg total) by mouth 2 (two) times daily. 60 tablet 6   albuterol (VENTOLIN HFA) 108 (90 Base) MCG/ACT inhaler Inhale 2 puffs into the lungs every 6 (six) hours as needed for wheezing or shortness of breath. 8 g 0   aspirin EC 81 MG tablet Take 81 mg by mouth daily.     atorvastatin (LIPITOR) 20 MG tablet TAKE 1 TABLET BY MOUTH ONCE DAILY 90 tablet 3   BREO ELLIPTA 100-25 MCG/INH AEPB INHALE 1 PUFF INTO THE LUNGS DAILY 60 each 5   cetirizine (ZYRTEC) 10 MG tablet Take 1 tablet (10  mg total) by mouth daily. 90 tablet 2   denosumab (PROLIA) 60 MG/ML SOSY injection Inject 60 mg into the skin every 6 (six) months.     gabapentin (NEURONTIN) 300 MG capsule Take 1 capsule (300 mg total) by mouth 2 (two) times daily. (Patient taking differently: Take 300 mg by mouth as needed.) 60 capsule 1   glucose blood (ACCU-CHEK AVIVA PLUS) test strip Use to Monitor blood sugars  daily 100 each 5   hydrochlorothiazide (MICROZIDE) 12.5 MG capsule Take 1 capsule (12.5 mg total) by mouth daily. 90 capsule 3   lidocaine-prilocaine (EMLA) cream Apply to affected area once 30 g 3   losartan (COZAAR) 100 MG tablet Take 1 tablet (100 mg total) by mouth daily. 90 tablet 2   LUTEIN PO Take by mouth.     metoprolol succinate (TOPROL-XL) 50 MG 24 hr tablet Take with or immediately following a meal. 90 tablet 2   Multiple Vitamin (MULTIVITAMIN) capsule Take 1 capsule by mouth daily.     Multiple Vitamins-Minerals (HAIR SKIN AND NAILS FORMULA PO) Take by mouth.     Multiple Vitamins-Minerals (WOMENS DAILY FORMULA PO) Take by mouth.     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 Extensive hypermetabolic lymphadenopathy involving the neck, chest and abdomen as detailed above (Deauville 5). I do not see any pelvic disease. 2. Possible involvement of the spleen. 3. Osseous involvement is also noted.     09/03/2020 Pathology Results   A. LYMPH NODE, LEFT NECK, EXCISION:  -Lymphoproliferative disorder consistent with classical Hodgkin lymphoma -See  comment   COMMENT:   The sections show effacement of the lymph nodal architecture by a primarily nodular lymphoproliferative process.  In the more cellular areas, the nodules are composed of a mixture of small lymphocytes, plasma cells, abundant histiocytes in addition to atypical large mononuclear and multi-lobated lymphoid appearing cells with variably prominent nucleoli.  The nodules are surrounded by dense collagenous fibrosis.  In the less cellular areas, there appears to be a fibroblastic proliferation and a much more depleted cellular appearance but scattering of large atypical lymphoid appearing cells is still seen.   Flow cytometric analysis was performed [facility] [id number]) and shows predominance of T lymphocytes with nonspecific changes including relative abundance of CD8 positive cells and reversal of the CD4: CD8 ratio.  No monoclonal B-cell population identified.  A battery of immunohistochemical stains was performed and shows that the large atypical lymphoid appearing cells are positive for CD30, Mum-1, PAX 5 (weak), CD23 in addition to variable but generally weak positivity for CD79a and CD20.  Only scattered large atypical lymphoid cells show weak Golgi positivity for CD15.  The large  atypical lymphoid cells are negative for LCA, CD10, CD3, CD5, EMA, ALK protein and EBV in situ  hybridization.  The small lymphoid cells in the background show a mixture of T and B cells with predominance of T cells.  T cells show a mixture of CD4 and CD8 positive cells with slight predominance of the latter.  The overall findings are most consistent with classical Hodgkin lymphoma, nodular sclerosis subtype.      09/21/2020 Procedure   Placement of a subcutaneous port device. Catheter tip at the superior cavoatrial junction.   09/25/2020 Echocardiogram   1. Left ventricular ejection fraction, by estimation, is 60 to 65%. The left ventricle has normal function. The left ventricle has no regional wall motion  abnormalities. There is mild concentric left ventricular hypertrophy. Left ventricular diastolic parameters are consistent with Grade I diastolic dysfunction (impaired relaxation). The average left ventricular global longitudinal strain is -19.6 %. The global longitudinal strain is normal.  2. Right ventricular systolic function is normal. The right ventricular size is normal.  3. A small pericardial effusion is present. The pericardial effusion is surrounding the apex and anterior to the right ventricle. There is no evidence of cardiac tamponade.  4. The mitral valve is normal in structure. No evidence of mitral valve regurgitation.  5. The aortic valve is tricuspid. Aortic valve regurgitation is not visualized. Mild aortic valve sclerosis is present, with no evidence of aortic valve stenosis.  6. The inferior vena cava is normal in size with greater than 50% respiratory variability, suggesting right atrial pressure of 3 mmHg.    Chemotherapy    Patient is on Treatment Plan: HODGKINS LYMPHOMA  A + AVD Q28D       11/23/2020 PET scan   1. Interval significant response to therapy. The previously demonstrated hypermetabolic adenopathy in the neck, chest and abdomen has nearly completely resolved. Deauville 1 and 2. Slightly greater residual metabolic activity within hilar lymph nodes is less specific and may be reactive. 2. Resolution of previously demonstrated focal hypermetabolic activity in the spleen and bones. Diffuse bone marrow activity attributed to treatment response. 3. Stable incidental findings as detailed above.   01/11/2021 Echocardiogram    1. Stable EF and GLS compared to 09/25/20. Left ventricular ejection fraction, by estimation, is 60 to 65%. The left ventricle has normal function. The left ventricle has no regional wall motion abnormalities. Left ventricular diastolic parameters are consistent with Grade I diastolic dysfunction (impaired relaxation). The average left ventricular  global longitudinal strain is -19.7 %. The global longitudinal strain is normal.  2. Right ventricular systolic function is normal. The right ventricular size is normal.  3. Left atrial size was mildly dilated.  4. The mitral valve is abnormal. Trivial mitral valve regurgitation. No evidence of mitral stenosis. Moderate mitral annular calcification.  5. The aortic valve is calcified. There is moderate calcification of the aortic valve. Aortic valve regurgitation is not visualized. Mild to moderate aortic valve sclerosis/calcification is present, without any evidence of aortic stenosis.  6. The inferior vena cava is normal in size with greater than 50% respiratory variability, suggesting right atrial pressure of 3 mmHg.     04/01/2021 PET scan   Resolution of bilateral hilar activity and decreased size of LEFT retroperitoneal lymph node with stable SUV value less than mediastinal blood pool.   Interval development of pulmonary nodules with increased metabolic activity. Findings are nonspecific. Nodule in the RIGHT upper lobe has a branching pattern that could be seen in the setting  of infectious or inflammatory changes. This is considered due to relatively rapid development. There is however moderate, relatively higher uptake in the LEFT lower lobe raising the question of neoplasm including lymphoma.   Three-vessel coronary artery disease.   Aortic Atherosclerosis (ICD10-I70.0).     07/01/2021 Imaging   1. Resolution of mediastinal and hilar lymphadenopathy. Minimal residual matted soft tissue density consistent with treated disease. No findings suspicious for residual or recurrent lymphoma. 2. Small branching nodular lesion in the right upper lobe and subpleural nodularity at the left lung base are stable. Likely benign process. Attention on future scans is suggested. 3. No acute pulmonary findings.   Aortic Atherosclerosis (ICD10-I70.0).     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HCC)   09/14/2020 Initial Diagnosis   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HCC)   09/29/2020 -  Chemotherapy    Patient is on Treatment Plan: HODGKINS LYMPHOMA  A + AVD Q28D         PHYSICAL EXAMINATION: ECOG PERFORMANCE STATUS: 0 - Asymptomatic  Vitals:   07/05/21 0815  BP: 136/61  Pulse: 93  Resp: 18  Temp: 98.3 F (36.8 C)  SpO2: 100%   Filed Weights   07/05/21 0815  Weight: 138 lb 3.2 oz (62.7 kg)    GENERAL:alert, no distress and comfortable NEURO: alert & oriented x 3 with fluent speech, no focal motor/sensory deficits  LABORATORY DATA:  I have reviewed the data as listed    Component Value Date/Time   NA 139 07/01/2021 0837   NA 143 08/15/2016 1037   K 4.1 07/01/2021 0837   K 4.8 08/15/2016 1037   CL 104 07/01/2021 0837   CL 103 07/16/2012 1304   CO2 28 07/01/2021 0837   CO2 27 08/15/2016 1037   GLUCOSE 117 (H) 07/01/2021 0837   GLUCOSE 111 08/15/2016 1037   GLUCOSE 112 (H) 07/16/2012 1304   BUN 13 07/01/2021 0837   BUN 12.7 08/15/2016 1037   CREATININE 0.73 07/01/2021 0837   CREATININE 0.95 (H) 08/10/2020 1041   CREATININE 0.9 08/15/2016 1037   CALCIUM 9.1 07/01/2021 0837   CALCIUM 10.3 08/13/2020 1117   CALCIUM 10.5 (H) 08/15/2016 1037   PROT 6.9 07/01/2021 0837   PROT 7.9 08/15/2016 1037   ALBUMIN 4.4 07/01/2021 0837   ALBUMIN 4.5 08/15/2016 1037   AST 21 07/01/2021 0837   AST 30 08/15/2016 1037   ALT 16 07/01/2021 0837   ALT 29 08/15/2016 1037   ALKPHOS 40 07/01/2021 0837   ALKPHOS 67 08/15/2016 1037   BILITOT 0.5 07/01/2021 0837   BILITOT 0.80 08/15/2016 1037   GFRNONAA >60 07/01/2021 0837   GFRNONAA 62 12/05/2017 1000   GFRAA 72 12/05/2017 1000    No results found for: SPEP, UPEP  Lab Results  Component Value Date   WBC 8.6 07/01/2021   NEUTROABS 6.2 07/01/2021   HGB 13.2 07/01/2021   HCT 41.0 07/01/2021   MCV 87.6 07/01/2021   PLT 223 07/01/2021      Chemistry      Component Value Date/Time   NA 139 07/01/2021 0837   NA 143  08/15/2016 1037   K 4.1 07/01/2021 0837   K 4.8 08/15/2016 1037   CL 104 07/01/2021 0837   CL 103 07/16/2012 1304   CO2 28 07/01/2021 0837   CO2 27 08/15/2016 1037   BUN 13 07/01/2021 0837   BUN 12.7 08/15/2016 1037   CREATININE 0.73 07/01/2021 0837   CREATININE 0.95 (H) 08/10/2020 1041   CREATININE 0.9 08/15/2016 1037  Component Value Date/Time   CALCIUM 9.1 07/01/2021 0837   CALCIUM 10.3 08/13/2020 1117   CALCIUM 10.5 (H) 08/15/2016 1037   ALKPHOS 40 07/01/2021 0837   ALKPHOS 67 08/15/2016 1037   AST 21 07/01/2021 0837   AST 30 08/15/2016 1037   ALT 16 07/01/2021 0837   ALT 29 08/15/2016 1037   BILITOT 0.5 07/01/2021 0837   BILITOT 0.80 08/15/2016 1037       RADIOGRAPHIC STUDIES: I have reviewed CT imaging with the patient I have personally reviewed the radiological images as listed and agreed with the findings in the report. CT CHEST W CONTRAST  Result Date: 07/01/2021 CLINICAL DATA:  History of Hodgkin's lymphoma. EXAM: CT CHEST WITH CONTRAST TECHNIQUE: Multidetector CT imaging of the chest was performed during intravenous contrast administration. RADIATION DOSE REDUCTION: This exam was performed according to the departmental dose-optimization program which includes automated exposure control, adjustment of the mA and/or kV according to patient size and/or use of iterative reconstruction technique. CONTRAST:  78mL OMNIPAQUE IOHEXOL 300 MG/ML  SOLN COMPARISON:  PET-CT 03/31/2021 FINDINGS: Cardiovascular: The heart is normal in size. No pericardial effusion. The aorta is normal in caliber. No dissection. Stable scattered atherosclerotic calcifications. Stable scattered coronary artery calcifications. The right Port-A-Cath is in good position without complicating features. Mediastinum/Nodes: Resolution of mediastinal and hilar lymphadenopathy. Minimal residual matted soft tissue density consistent with treated disease. No findings suspicious for residual or recurrent lymphoma.  The esophagus is grossly normal. Lungs/Pleura: No acute pulmonary findings. No worrisome pulmonary lesions. Smaller branching nodular density in the right upper lobe this likely an area of mucoid impaction. Stable subpleural nodularity at the left lung base likely atelectasis or scar. Upper Abdomen: No significant upper abdominal findings. 7 mm left para-aortic node on image 148/2 previously measured 9 mm. Stable left renal cyst. No hepatic or adrenal gland lesions. Stable vascular calcifications. Musculoskeletal: No breast masses, supraclavicular or axillary adenopathy. The bony thorax is intact. IMPRESSION: 1. Resolution of mediastinal and hilar lymphadenopathy. Minimal residual matted soft tissue density consistent with treated disease. No findings suspicious for residual or recurrent lymphoma. 2. Small branching nodular lesion in the right upper lobe and subpleural nodularity at the left lung base are stable. Likely benign process. Attention on future scans is suggested. 3. No acute pulmonary findings. Aortic Atherosclerosis (ICD10-I70.0). Electronically Signed   By: Marijo Sanes M.D.   On: 07/01/2021 15:49

## 2021-07-12 ENCOUNTER — Other Ambulatory Visit: Payer: Self-pay

## 2021-07-12 ENCOUNTER — Encounter: Payer: Self-pay | Admitting: Nurse Practitioner

## 2021-07-12 ENCOUNTER — Ambulatory Visit: Payer: Medicare PPO | Admitting: Nurse Practitioner

## 2021-07-12 VITALS — BP 116/62 | HR 75 | Ht 63.0 in | Wt 137.0 lb

## 2021-07-12 DIAGNOSIS — G609 Hereditary and idiopathic neuropathy, unspecified: Secondary | ICD-10-CM | POA: Diagnosis not present

## 2021-07-12 DIAGNOSIS — I10 Essential (primary) hypertension: Secondary | ICD-10-CM | POA: Diagnosis not present

## 2021-07-12 DIAGNOSIS — R7303 Prediabetes: Secondary | ICD-10-CM | POA: Diagnosis not present

## 2021-07-12 DIAGNOSIS — M810 Age-related osteoporosis without current pathological fracture: Secondary | ICD-10-CM | POA: Diagnosis not present

## 2021-07-12 DIAGNOSIS — J4540 Moderate persistent asthma, uncomplicated: Secondary | ICD-10-CM | POA: Diagnosis not present

## 2021-07-12 DIAGNOSIS — F430 Acute stress reaction: Secondary | ICD-10-CM | POA: Diagnosis not present

## 2021-07-12 DIAGNOSIS — E7800 Pure hypercholesterolemia, unspecified: Secondary | ICD-10-CM | POA: Diagnosis not present

## 2021-07-12 DIAGNOSIS — K219 Gastro-esophageal reflux disease without esophagitis: Secondary | ICD-10-CM | POA: Diagnosis not present

## 2021-07-12 MED ORDER — OMEPRAZOLE 20 MG PO CPDR: 20.0000 mg | DELAYED_RELEASE_CAPSULE | Freq: Every day | ORAL | 1 refills | Status: AC

## 2021-07-12 MED ORDER — FLUTICASONE FUROATE-VILANTEROL 100-25 MCG/ACT IN AEPB: 1.0000 | INHALATION_SPRAY | Freq: Every day | RESPIRATORY_TRACT | 5 refills | Status: AC

## 2021-07-12 MED ORDER — HYDROCHLOROTHIAZIDE 12.5 MG PO CAPS: 12.5000 mg | ORAL_CAPSULE | Freq: Every day | ORAL | 1 refills | Status: DC

## 2021-07-12 MED ORDER — ATORVASTATIN CALCIUM 20 MG PO TABS: 20.0000 mg | ORAL_TABLET | Freq: Every day | ORAL | 1 refills | Status: AC

## 2021-07-12 MED ORDER — GABAPENTIN 300 MG PO CAPS: 300.0000 mg | ORAL_CAPSULE | Freq: Every day | ORAL | 1 refills | Status: DC | PRN

## 2021-07-12 MED ORDER — LOSARTAN POTASSIUM 100 MG PO TABS: 100.0000 mg | ORAL_TABLET | Freq: Every day | ORAL | 1 refills | Status: DC

## 2021-07-12 MED ORDER — ALBUTEROL SULFATE HFA 108 (90 BASE) MCG/ACT IN AERS: 2.0000 | INHALATION_SPRAY | Freq: Four times a day (QID) | RESPIRATORY_TRACT | 2 refills | Status: AC | PRN

## 2021-07-12 MED ORDER — CETIRIZINE HCL 10 MG PO TABS: 10.0000 mg | ORAL_TABLET | Freq: Every day | ORAL | 1 refills | Status: AC

## 2021-07-12 MED ORDER — METOPROLOL SUCCINATE ER 50 MG PO TB24: 50.0000 mg | ORAL_TABLET | Freq: Every day | ORAL | 1 refills | Status: DC

## 2021-07-12 NOTE — Assessment & Plan Note (Addendum)
Chronic.  Bone density scheduled for May.  Plan to continue Prolia, encouraged scheduling nurse visit appointment to receive injection if she has not been set up with new PCP in next couple of months.

## 2021-07-12 NOTE — Assessment & Plan Note (Signed)
Chronic.  Stable with gabapentin 300 mg daily as needed.  Refills given.

## 2021-07-12 NOTE — Assessment & Plan Note (Addendum)
Chronic.  Plan to continue omeprazole 20 mg daily for now, refills given.  Recent CBC normal.

## 2021-07-12 NOTE — Assessment & Plan Note (Addendum)
Chronic.  Blood pressure at goal of less than 140/90 today in clinic.  Recent electrolytes and kidney function normal.  Continue current medications including hydrochlorothiazide 12.5 mg daily and losartan 100 mg daily.  Refills given today.

## 2021-07-12 NOTE — Progress Notes (Signed)
Subjective:    Patient ID: Cathy [name], female    DOB: Jun 18, 1945, 76 y.o.   MRN: [id number]  HPI: Cathy [name] is a 76 y.o. female presenting for follow up.  Chief Complaint  Patient presents with   Follow-up    .   HYPERTENSION / HYPERLIPIDEMIA Currently taking atorvastatin 20 mg daily, HCTZ 12.5 mg daily, and losartan 100 mg daily for high blood pressure and high cholesterol.   Aspirin: no Recent stressors: yes Recurrent headaches: no Visual changes: no Palpitations: yes; reports more panic attacks and feels her heart racing  Dyspnea: no Chest pain: no Lower extremity edema: no Dizzy/lightheaded: no Myalgias: no LDL goal: less than 100 BP goal: less than 140/90 The 10-year ASCVD risk score (Arnett DK, et al., 2019) is: 15.2%   Values used to calculate the score:     Age: 63 years     Sex: Female     Is Non-Hispanic African American: No     Diabetic: No     Tobacco smoker: No     Systolic Blood Pressure: 856 mmHg     Is BP treated: Yes     HDL Cholesterol: 41 mg/dL     Total Cholesterol: 132 mg/dL.  PRE-DIABETES Last A1c was 5.3%.  She checks her blood sugar at home and reports it has been in the 120s fasting.  A1c goal less than 7%.    NEUROPATHY Reports her symptoms are stable with gabapentin 300 mg daily as needed.  She does not take very often and does not like to unless the neuropathy is very bad.   GERD Reports symptoms are well controlled with omeprazole 20 mg daily.   She has bone density scheduled for May 2023.  She is requesting we order Prolia injection so she does not miss a dose.   It sounds like she is having frequent panic attacks.  She is feeling very anxious about finding a new primary care provider. She declines anti anxiety medication at this time and reports she has a good support system of people she can reach out to.    Allergies  Allergen Reactions   Codeine     Patients states when she was young, she was told she "acted  weird" after being given codeine    Outpatient Encounter Medications as of 07/12/2021  Medication Sig   Acetaminophen (TYLENOL) 325 MG CAPS Take by mouth.   aspirin EC 81 MG tablet Take 81 mg by mouth daily.   denosumab (PROLIA) 60 MG/ML SOSY injection Inject 60 mg into the skin every 6 (six) months.   glucose blood (ACCU-CHEK AVIVA PLUS) test strip Use to Monitor blood sugars daily   lidocaine-prilocaine (EMLA) cream Apply to affected area once   LUTEIN PO Take by mouth.   Multiple Vitamin (MULTIVITAMIN) capsule Take 1 capsule by mouth daily.   Multiple Vitamins-Minerals (HAIR SKIN AND NAILS FORMULA PO) Take by mouth.   Multiple Vitamins-Minerals (WOMENS DAILY FORMULA PO) Take by mouth.   NON FORMULARY Began again supplement   [DISCONTINUED] acyclovir (ZOVIRAX) 400 MG tablet Take 1 tablet (400 mg total) by mouth 2 (two) times daily.   [DISCONTINUED] albuterol (VENTOLIN HFA) 108 (90 Base) MCG/ACT inhaler Inhale 2 puffs into the lungs every 6 (six) hours as needed for wheezing or shortness of breath.   [DISCONTINUED] atorvastatin (LIPITOR) 20 MG tablet TAKE 1 TABLET BY MOUTH ONCE DAILY   [DISCONTINUED] BREO ELLIPTA 100-25 MCG/INH AEPB INHALE 1 PUFF INTO THE LUNGS DAILY   [  DISCONTINUED] cetirizine (ZYRTEC) 10 MG tablet Take 1 tablet (10 mg total) by mouth daily.   [DISCONTINUED] gabapentin (NEURONTIN) 300 MG capsule Take 1 capsule (300 mg total) by mouth 2 (two) times daily. (Patient taking differently: Take 300 mg by mouth as needed.)   [DISCONTINUED] hydrochlorothiazide (MICROZIDE) 12.5 MG capsule Take 1 capsule (12.5 mg total) by mouth daily.   [DISCONTINUED] losartan (COZAAR) 100 MG tablet Take 1 tablet (100 mg total) by mouth daily.   [DISCONTINUED] metoprolol succinate (TOPROL-XL) 50 MG 24 hr tablet Take with or immediately following a meal.   [DISCONTINUED] omeprazole (PRILOSEC) 20 MG capsule TAKE 1 CAPSULE BY MOUTH ONCE DAILY   albuterol (VENTOLIN HFA) 108 (90 Base) MCG/ACT inhaler  Inhale 2 puffs into the lungs every 6 (six) hours as needed for wheezing or shortness of breath.   atorvastatin (LIPITOR) 20 MG tablet Take 1 tablet (20 mg total) by mouth daily.   cetirizine (ZYRTEC) 10 MG tablet Take 1 tablet (10 mg total) by mouth daily.   fluticasone furoate-vilanterol (BREO ELLIPTA) 100-25 MCG/ACT AEPB Inhale 1 puff into the lungs daily.   gabapentin (NEURONTIN) 300 MG capsule Take 1 capsule (300 mg total) by mouth daily as needed (neuropathy).   hydrochlorothiazide (MICROZIDE) 12.5 MG capsule Take 1 capsule (12.5 mg total) by mouth daily.   losartan (COZAAR) 100 MG tablet Take 1 tablet (100 mg total) by mouth daily.   metoprolol succinate (TOPROL-XL) 50 MG 24 hr tablet Take 1 tablet (50 mg total) by mouth daily. Take with or immediately following a meal.   omeprazole (PRILOSEC) 20 MG capsule Take 1 capsule (20 mg total) by mouth daily.   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[place]) 08/25/2020   Osteoporosis 07/16/2019   Chronic left shoulder pain 07/16/2019   Fatty liver 06/19/2018   Pulmonary infiltrate 09/19/2016   Neutrophilia 08/18/2015   Insomnia 03/10/2015   Benign paroxysmal positional vertigo 10/28/2014   OA (osteoarthritis) of knee 09/17/2013   Pre-diabetes 09/17/2013   Peripheral neuropathy 09/17/2013   Asthma    Essential hypertension    GERD (gastroesophageal reflux disease)    Hyperlipidemia    Hilar lymphadenopathy 08/01/2011    Past Medical History:  Diagnosis Date   Abnormal glucose    Allergy    Asthma     Allergy induced   Benign breast cyst in female    GERD (gastroesophageal reflux disease)    Hilar lymphadenopathy 08/01/2011   History of nuclear stress test 2009   Treadmill and Stress Myoview- no CAD   Hyperlipidemia    Hypertension    Lymphadenopathy of left cervical region 08/01/2011   Nephrolithiasis 1984   Osteopenia    PONV (postoperative nausea and vomiting)    Post-menopause    Pre-diabetes    borderline   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 Interim medical history since our last visit reviewed.  Review of Systems Per HPI unless specifically indicated above     Objective:  BP 116/62    Pulse 75    Ht 5\' 3"  (1.6 m)    Wt 137 lb (62.1 kg)    SpO2 99%    BMI 24.27 kg/m   Wt Readings from Last 3 Encounters:  07/12/21 137 lb (62.1 kg)  07/05/21 138 lb 3.2 oz (62.7 kg)  06/28/21 136 lb (61.7 kg)    Physical Exam Vitals and nursing note reviewed.  Constitutional:      General: She is not in acute distress.    Appearance: Normal appearance. She is not toxic-appearing.  HENT:     Head: Normocephalic and atraumatic.     Right Ear: Tympanic membrane, ear canal and external ear normal.     Left Ear: Tympanic membrane, ear canal and external ear normal.  Eyes:     General: No scleral icterus.    Extraocular Movements: Extraocular movements intact.  Cardiovascular:     Rate and Rhythm: Normal rate and regular rhythm.  Pulmonary:     Effort: Pulmonary effort is normal. No respiratory distress.     Breath sounds: Normal breath sounds. No wheezing, rhonchi or rales.  Musculoskeletal:     Right lower leg: No edema.     Left lower leg: No edema.  Skin:    General: Skin is warm and dry.     Coloration: Skin is not jaundiced or pale.     Findings: No erythema.  Neurological:     Mental Status: She is alert and oriented to person, place, and time.      Motor: No weakness.     Gait: Gait normal.  Psychiatric:        Mood and Affect: Mood normal.        Behavior: Behavior normal.        Thought Content: Thought content normal.        Judgment: Judgment normal.      Assessment & Plan:   Problem List Items Addressed This Visit       Cardiovascular and Mediastinum   Essential hypertension - Primary    Chronic.  Blood pressure at goal of less than 140/90 today in clinic.  Recent electrolytes and kidney function normal.  Continue current medications including hydrochlorothiazide 12.5 mg daily and losartan 100 mg daily.  Refills given today.      Relevant Medications   atorvastatin (LIPITOR) 20 MG tablet   hydrochlorothiazide (MICROZIDE) 12.5 MG capsule   losartan (COZAAR) 100 MG tablet   metoprolol succinate (TOPROL-XL) 50 MG 24 hr tablet     Respiratory   Asthma    Chronic.  Well-controlled with Breo and albuterol as needed.  Refills given today.      Relevant Medications   albuterol (VENTOLIN HFA) 108 (90 Base) MCG/ACT inhaler   fluticasone furoate-vilanterol (BREO ELLIPTA) 100-25 MCG/ACT AEPB     Digestive   GERD (gastroesophageal reflux disease)    Chronic.  Plan to continue omeprazole 20 mg daily for now, refills given.  Recent CBC normal.      Relevant Medications   omeprazole (PRILOSEC) 20 MG capsule     Nervous and Auditory   Peripheral neuropathy    Chronic.  Stable with gabapentin 300 mg daily as needed.  Refills given.      Relevant Medications   gabapentin (NEURONTIN) 300 MG capsule     Musculoskeletal and Integument   Osteoporosis    Chronic.  Bone density scheduled for May.  Plan to continue Prolia, encouraged scheduling nurse visit appointment to receive  injection if she has not been set up with new PCP in next couple of months.         Other   Pre-diabetes    Chronic.  Last A1c 5.3%.  We will hold off on rechecking today as fasting blood sugars at home have been at goal.      Hyperlipidemia     Chronic.  Last LDL less than 70, continue atorvastatin 20 mg daily.  Recent liver function normal.      Relevant Medications   atorvastatin (LIPITOR) 20 MG tablet   hydrochlorothiazide (MICROZIDE) 12.5 MG capsule   losartan (COZAAR) 100 MG tablet   metoprolol succinate (TOPROL-XL) 50 MG 24 hr tablet   Other Visit Diagnoses     Stress reaction       Acute.  Discussed daily medication, however patient declines for now.  I encouraged her to reach out to new PCP ASAP to prevent lapse in health care.        Follow up plan: Return for with new PCP.

## 2021-07-12 NOTE — Assessment & Plan Note (Signed)
Chronic.  Last A1c 5.3%.  We will hold off on rechecking today as fasting blood sugars at home have been at goal.

## 2021-07-12 NOTE — Assessment & Plan Note (Addendum)
Chronic.  Well-controlled with Breo and albuterol as needed.  Refills given today.

## 2021-07-12 NOTE — Assessment & Plan Note (Signed)
Chronic.  Last LDL less than 70, continue atorvastatin 20 mg daily.  Recent liver function normal.

## 2021-07-26 ENCOUNTER — Telehealth: Payer: Self-pay | Admitting: Nurse Practitioner

## 2021-07-26 NOTE — Telephone Encounter (Signed)
Called pt and got her scheduled for 2/16 @8 

## 2021-07-26 NOTE — Telephone Encounter (Signed)
-----   Message from Filbert Berthold, [place] sent at 07/22/2021 12:20 PM EST ----- Please call patient and set up appt ----- Message ----- From: Tonia Ghent, MD Sent: 07/22/2021  12:15 PM EST To: Filbert Berthold, CMA  See below. This is Dr. Samella Parr mother.  Please call her about getting a 36min appointment set up here with me.  Thanks.   Cathy [name]  ----- Message ----- From: Susy Frizzle, MD Sent: 07/22/2021  12:00 PM EST To: Tonia Ghent, MD  She asked if you would see her.  I of course objected because I love her, but she insisted.  If you are willing to see her, I would really appreciate it.  I know you are a good doctor.  ----- Message ----- From: Tonia Ghent, MD Sent: 07/22/2021  10:39 AM EST To: Susy Frizzle, MD  Does she want to see me or someone else?  I can get her set up here with me or another doc.  Does she want to see a lady or a guy?  If she doesn't want to see me, then I completely get that.  If she wants me, then we'll set it up.    Either way, we'll get her an appointment.   Please let me know.    Cathy [name]  ----- Message ----- From: Susy Frizzle, MD Sent: 07/22/2021   9:28 AM EST To: Tonia Ghent, MD  Everyone has left my office except me so my mom needs to find a new doctor.  No pressure and its okay if you are full but would you mind seeing her or is there somebody else good in your office that you recommend? No worries if you can't Cathy [name]

## 2021-07-29 ENCOUNTER — Ambulatory Visit: Payer: Medicare PPO | Admitting: Family Medicine

## 2021-08-06 ENCOUNTER — Other Ambulatory Visit: Payer: Self-pay | Admitting: Nurse Practitioner

## 2021-08-06 DIAGNOSIS — Z1231 Encounter for screening mammogram for malignant neoplasm of breast: Secondary | ICD-10-CM

## 2021-08-12 ENCOUNTER — Encounter: Payer: Self-pay | Admitting: Family Medicine

## 2021-08-12 ENCOUNTER — Other Ambulatory Visit: Payer: Self-pay

## 2021-08-12 ENCOUNTER — Ambulatory Visit (INDEPENDENT_AMBULATORY_CARE_PROVIDER_SITE_OTHER): Payer: Medicare PPO | Admitting: Family Medicine

## 2021-08-12 VITALS — BP 120/78 | HR 75 | Temp 97.3°F | Ht 63.0 in | Wt 137.0 lb

## 2021-08-12 DIAGNOSIS — M810 Age-related osteoporosis without current pathological fracture: Secondary | ICD-10-CM | POA: Diagnosis not present

## 2021-08-12 DIAGNOSIS — Z1239 Encounter for other screening for malignant neoplasm of breast: Secondary | ICD-10-CM | POA: Diagnosis not present

## 2021-08-12 DIAGNOSIS — C8118 Nodular sclerosis classical Hodgkin lymphoma, lymph nodes of multiple sites: Secondary | ICD-10-CM | POA: Diagnosis not present

## 2021-08-12 DIAGNOSIS — F419 Anxiety disorder, unspecified: Secondary | ICD-10-CM | POA: Diagnosis not present

## 2021-08-12 DIAGNOSIS — Z7189 Other specified counseling: Secondary | ICD-10-CM

## 2021-08-12 MED ORDER — HYDROXYZINE HCL 10 MG PO TABS: 10.0000 mg | ORAL_TABLET | Freq: Three times a day (TID) | ORAL | 1 refills | Status: DC | PRN

## 2021-08-12 MED ORDER — TURMERIC 450 MG PO CAPS: 450.0000 mg | ORAL_CAPSULE | Freq: Every day | ORAL | Status: DC

## 2021-08-12 MED ORDER — VITAMIN D3 25 MCG (1000 UT) PO CAPS: 1000.0000 [IU] | ORAL_CAPSULE | Freq: Every day | ORAL | Status: DC

## 2021-08-12 MED ORDER — ESCITALOPRAM OXALATE 10 MG PO TABS: 10.0000 mg | ORAL_TABLET | Freq: Every day | ORAL | 1 refills | Status: DC

## 2021-08-12 NOTE — Progress Notes (Signed)
This visit occurred during the SARS-CoV-2 public health emergency.  Safety protocols were in place, including screening questions prior to the visit, additional usage of staff PPE, and extensive cleaning of exam room while observing appropriate contact time as indicated for disinfecting solutions. ? ?New patient/transfer care. ? ?Stressors d/w pt.  Started taking st John's wort, tumeric and vit D.  She is walking.  But she still has significant anxiety.  She is worried about her son.  She had a significant loss with her husband, who died in middle-age.  Her son is approaching the age when her husband died.  We deferred doing anxiety and depression screening questionnaires because that was more upsetting to the patient and counterproductive.  She is not suicidal or homicidal.  She has gone through cancer and chemotherapy recently.  She has been dealing with a lot of upheaval.  She is safe at home. ? ?Son Malia Corsi designated if patient were incapacitated.   ? ?She is going to see Dr. McDiarmid with urology tomorrow about possible bladder prolapse.  Not burning with urination.   ? ?We talked about updating her order for mammogram and bone density. ? ?Meds, vitals, and allergies reviewed.  ? ?ROS: Per HPI unless specifically indicated in ROS section  ? ?GEN: nad, alert and oriented, she is worried and tearful but regains composure. ?HEENT: ncat ?NECK: supple w/o LA ?CV: rrr. ?PULM: ctab, no inc wob ?ABD: soft, +bs ?EXT: no edema ?SKIN: Well-perfused. ?

## 2021-08-12 NOTE — Patient Instructions (Signed)
Taper off St. John's wort by taking 1 pill a day for 1 week then stop.  ? ?Start taking lexapro 10mg  a day in the meantime.  ?Then use hydroxyzine if needed in the meantime for anxiety.  ?Let me know how this goes in the next week or two.  ? ?We'll go from there.  ?Take care.  Glad to see you. ?

## 2021-08-13 DIAGNOSIS — R350 Frequency of micturition: Secondary | ICD-10-CM | POA: Diagnosis not present

## 2021-08-13 DIAGNOSIS — N8111 Cystocele, midline: Secondary | ICD-10-CM | POA: Diagnosis not present

## 2021-08-13 DIAGNOSIS — N3946 Mixed incontinence: Secondary | ICD-10-CM | POA: Diagnosis not present

## 2021-08-13 DIAGNOSIS — R351 Nocturia: Secondary | ICD-10-CM | POA: Diagnosis not present

## 2021-08-16 ENCOUNTER — Ambulatory Visit: Payer: Medicare PPO | Admitting: Nurse Practitioner

## 2021-08-16 DIAGNOSIS — F419 Anxiety disorder, unspecified: Secondary | ICD-10-CM | POA: Insufficient documentation

## 2021-08-16 DIAGNOSIS — Z7189 Other specified counseling: Secondary | ICD-10-CM | POA: Insufficient documentation

## 2021-08-16 NOTE — Assessment & Plan Note (Signed)
?  Son Cathy [name] designated if patient were incapacitated.   ?

## 2021-08-16 NOTE — Assessment & Plan Note (Signed)
History of, per oncology. 

## 2021-08-16 NOTE — Assessment & Plan Note (Signed)
I updated her order about her bone density since I am her new PCP and that way the results will come to me. ?

## 2021-08-16 NOTE — Assessment & Plan Note (Signed)
We talked about options.  Reasonable to start Lexapro with as needed hydroxyzine.  We talked about maintenance treatment versus symptomatic treatment.  Still okay for outpatient follow-up.  No suicidal or homicidal intent.  She has family support.  She has had significant people recently.  She will update me about her mood.  She agrees to plan. ? ?35 minutes were devoted to patient care in this encounter (this includes time spent reviewing the patient's file/history, interviewing and examining the patient, counseling/reviewing plan with patient).  ? ?

## 2021-08-19 ENCOUNTER — Telehealth: Payer: Self-pay | Admitting: Family Medicine

## 2021-08-19 NOTE — Telephone Encounter (Signed)
Patient placed on lexapro last Thursday at her visit with Damita Dunnings,  feels that she doesn't need it and that she feels like she was placed on it because she broke down in front of you regarding her brother? Patient is asking to speak with Dr. Damita Dunnings. Can be reached at  ?[phone]  ?

## 2021-08-22 NOTE — Telephone Encounter (Signed)
Late entry.  I called patient on 08/20/2021 at the end of clinic.  We talked about her situation.  She had stopped taking St. John's wort recently, as planned.  She is taking Lexapro.  She had used hydroxyzine as needed with relief in the interval.  She was not needing hydroxyzine as much in the meantime and I presume that is from Lexapro having some effect in the meantime.  I do not think it makes sense to make more than 1 change at a time.  She just stopped taking St. John's wort and I think it makes sense to continue Lexapro for now and use hydroxyzine if needed.  She can update me as needed.  There is not an obvious adverse effect on Lexapro or hydroxyzine that would necessitate cessation of either medication.  She agrees to plan. ?

## 2021-08-30 ENCOUNTER — Inpatient Hospital Stay: Payer: Medicare PPO | Attending: Hematology and Oncology

## 2021-08-30 ENCOUNTER — Other Ambulatory Visit: Payer: Self-pay

## 2021-08-30 DIAGNOSIS — C8118 Nodular sclerosis classical Hodgkin lymphoma, lymph nodes of multiple sites: Secondary | ICD-10-CM

## 2021-08-30 DIAGNOSIS — C8198 Hodgkin lymphoma, unspecified, lymph nodes of multiple sites: Secondary | ICD-10-CM | POA: Diagnosis not present

## 2021-08-30 MED ORDER — HEPARIN SOD (PORK) LOCK FLUSH 100 UNIT/ML IV SOLN
500.0000 [IU] | Freq: Once | INTRAVENOUS | Status: AC
  Administered 2021-08-30: 500 [IU]

## 2021-08-30 MED ORDER — SODIUM CHLORIDE 0.9% FLUSH
10.0000 mL | Freq: Once | INTRAVENOUS | Status: AC
  Administered 2021-08-30: 10 mL

## 2021-09-02 ENCOUNTER — Ambulatory Visit: Payer: Medicare PPO | Admitting: Family Medicine

## 2021-09-27 ENCOUNTER — Ambulatory Visit: Payer: Medicare PPO | Admitting: Family Medicine

## 2021-09-27 ENCOUNTER — Encounter: Payer: Self-pay | Admitting: Family Medicine

## 2021-09-27 VITALS — BP 118/78 | HR 64 | Temp 97.6°F | Ht 63.0 in | Wt 132.0 lb

## 2021-09-27 DIAGNOSIS — M810 Age-related osteoporosis without current pathological fracture: Secondary | ICD-10-CM

## 2021-09-27 DIAGNOSIS — F419 Anxiety disorder, unspecified: Secondary | ICD-10-CM

## 2021-09-27 LAB — VITAMIN D 25 HYDROXY (VIT D DEFICIENCY, FRACTURES): VITD: 52.18 ng/mL (ref 30.00–100.00)

## 2021-09-27 MED ORDER — HYDROXYZINE HCL 10 MG PO TABS: 5.0000 mg | ORAL_TABLET | Freq: Three times a day (TID) | ORAL | Status: DC | PRN

## 2021-09-27 NOTE — Patient Instructions (Addendum)
Go to the lab on the way out.   If you have mychart we'll likely use that to update you.    ?I would try taking lexapro at night.  If not tolerated then let me know.   ?Take care.  Glad to see you. ?
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
## 2021-09-30 NOTE — Assessment & Plan Note (Signed)
Reasonable to continue with Lexapro at night and use of drugs and as needed.  She can update me as needed.  Still okay for outpatient follow-up.  She agrees to plan. ?

## 2021-09-30 NOTE — Assessment & Plan Note (Signed)
Routine Prolia cautions discussed with patient.  Recheck vitamin D today.  She has follow-up DEXA pending for 10/28/2021. ?

## 2021-10-11 ENCOUNTER — Inpatient Hospital Stay: Payer: Medicare PPO | Attending: Hematology and Oncology

## 2021-10-11 ENCOUNTER — Other Ambulatory Visit: Payer: Self-pay

## 2021-10-11 DIAGNOSIS — Z452 Encounter for adjustment and management of vascular access device: Secondary | ICD-10-CM | POA: Diagnosis not present

## 2021-10-11 DIAGNOSIS — C8198 Hodgkin lymphoma, unspecified, lymph nodes of multiple sites: Secondary | ICD-10-CM | POA: Diagnosis not present

## 2021-10-11 DIAGNOSIS — C8118 Nodular sclerosis classical Hodgkin lymphoma, lymph nodes of multiple sites: Secondary | ICD-10-CM

## 2021-10-11 MED ORDER — HEPARIN SOD (PORK) LOCK FLUSH 100 UNIT/ML IV SOLN
500.0000 [IU] | Freq: Once | INTRAVENOUS | Status: AC
  Administered 2021-10-11: 500 [IU]

## 2021-10-11 MED ORDER — SODIUM CHLORIDE 0.9% FLUSH
10.0000 mL | Freq: Once | INTRAVENOUS | Status: AC
  Administered 2021-10-11: 10 mL

## 2021-10-14 DIAGNOSIS — R151 Fecal smearing: Secondary | ICD-10-CM | POA: Diagnosis not present

## 2021-10-14 DIAGNOSIS — N814 Uterovaginal prolapse, unspecified: Secondary | ICD-10-CM | POA: Diagnosis not present

## 2021-10-14 DIAGNOSIS — N952 Postmenopausal atrophic vaginitis: Secondary | ICD-10-CM | POA: Diagnosis not present

## 2021-10-14 DIAGNOSIS — N3941 Urge incontinence: Secondary | ICD-10-CM | POA: Diagnosis not present

## 2021-10-18 ENCOUNTER — Ambulatory Visit: Payer: Medicare PPO

## 2021-10-18 ENCOUNTER — Telehealth: Payer: Self-pay | Admitting: Family Medicine

## 2021-10-18 NOTE — Telephone Encounter (Signed)
Spoke with patient to schedule Medicare Annual Wellness Visit (AWV) either virtually or phone ? ?Patient stated she would call me back  ? ? ? ?Last AWV 08/10/20 ?; please schedule at anytime with health coach ? ?I gave my direct # [phone] ?

## 2021-10-26 ENCOUNTER — Encounter: Payer: Self-pay | Admitting: Family Medicine

## 2021-10-26 ENCOUNTER — Ambulatory Visit: Payer: Medicare PPO | Admitting: Family Medicine

## 2021-10-26 VITALS — BP 130/64 | HR 84 | Temp 97.7°F | Ht 63.0 in | Wt 132.0 lb

## 2021-10-26 DIAGNOSIS — F419 Anxiety disorder, unspecified: Secondary | ICD-10-CM | POA: Diagnosis not present

## 2021-10-26 MED ORDER — HYDROXYZINE HCL 10 MG PO TABS
5.0000 mg | ORAL_TABLET | Freq: Two times a day (BID) | ORAL | 1 refills | Status: DC | PRN
Start: 2021-10-26 — End: 2021-11-05

## 2021-10-26 MED ORDER — ESCITALOPRAM OXALATE 20 MG PO TABS: 20.0000 mg | ORAL_TABLET | Freq: Every day | ORAL | 1 refills | Status: DC

## 2021-10-26 NOTE — Progress Notes (Signed)
Mood follow up.  She had talked with her son about her situation.  He called and got patient an appointment in the meantime.  She has been anxious/jumpy in the meantime.  Some days are better than others.  Some nights are better than others.  Still on lexparo '10mg'$  daily.  Taking hydroxyzine 1/2 tab most days.  She feels more worried than depressed.   ? ?She was prev able to multitask but that has been much harder recently.   ? ?Meds, vitals, and allergies reviewed.  ? ?ROS: Per HPI unless specifically indicated in ROS section  ? ?Nad ?Ncat ?Tearful but regains composure. ?Speech is normal and fluent. ?Neck supple no LA ?Rrr ?Ctab ?Abd soft, not ttp ?Extremities without edema.  Skin well perfused. ? ?We talked about counseling and she is willing to give that a try.  She would prefer in person counseling, no gender preference.  Would prefer [facility].   ?

## 2021-10-26 NOTE — Patient Instructions (Addendum)
Increase lexapro to '20mg'$ .  ?Try taking 1/2 tab of hydroxyzine up to twice a day, sedation caution.  ?Take care.  Glad to see you. ?Recheck next week.  ?

## 2021-10-27 NOTE — Assessment & Plan Note (Signed)
Discussed options.  I would not abandon Lexapro use yet.  Rationale discussed with patient.  She has tolerated that medication so far. ?Increase lexapro to '20mg'$ .  ?Reasonable to try taking 1/2 tab of hydroxyzine up to twice a day, sedation caution.  ?Hopefully the increase in hydroxyzine will help in the meantime to allow her to get more relief from Lexapro in the long run.  Recheck next week.  Refer for counseling.  Still okay for outpatient follow-up. ?

## 2021-10-28 ENCOUNTER — Other Ambulatory Visit: Payer: Self-pay | Admitting: Family Medicine

## 2021-10-28 ENCOUNTER — Ambulatory Visit
Admission: RE | Admit: 2021-10-28 | Discharge: 2021-10-28 | Disposition: A | Discharge status: Home / Self Care | Payer: Medicare PPO | Source: Ambulatory Visit | Attending: Family Medicine | Admitting: Family Medicine

## 2021-10-28 ENCOUNTER — Ambulatory Visit
Admission: RE | Admit: 2021-10-28 | Discharge: 2021-10-28 | Disposition: A | Discharge status: Home / Self Care | Payer: Medicare PPO | Source: Ambulatory Visit | Attending: Nurse Practitioner | Admitting: Nurse Practitioner

## 2021-10-28 DIAGNOSIS — M810 Age-related osteoporosis without current pathological fracture: Secondary | ICD-10-CM

## 2021-10-28 DIAGNOSIS — M85852 Other specified disorders of bone density and structure, left thigh: Secondary | ICD-10-CM | POA: Diagnosis not present

## 2021-10-28 DIAGNOSIS — Z1231 Encounter for screening mammogram for malignant neoplasm of breast: Secondary | ICD-10-CM | POA: Diagnosis not present

## 2021-10-28 DIAGNOSIS — Z780 Asymptomatic menopausal state: Secondary | ICD-10-CM | POA: Diagnosis not present

## 2021-11-05 ENCOUNTER — Encounter: Payer: Self-pay | Admitting: Family Medicine

## 2021-11-05 ENCOUNTER — Telehealth: Payer: Self-pay

## 2021-11-05 ENCOUNTER — Ambulatory Visit: Payer: Medicare PPO | Admitting: Family Medicine

## 2021-11-05 DIAGNOSIS — F419 Anxiety disorder, unspecified: Secondary | ICD-10-CM | POA: Diagnosis not present

## 2021-11-05 DIAGNOSIS — M810 Age-related osteoporosis without current pathological fracture: Secondary | ICD-10-CM | POA: Diagnosis not present

## 2021-11-05 MED ORDER — HYDROXYZINE HCL 10 MG PO TABS: 10.0000 mg | ORAL_TABLET | Freq: Three times a day (TID) | ORAL | 5 refills | Status: DC | PRN

## 2021-11-05 NOTE — Progress Notes (Unsigned)
She didn't have ADE on prolia prior.  No recent dose.   I told her I would check on getting that set up here at clinic.  Rationale for use discussed with patient.  "I thought for the first 3 or 4 days" of the higher dose "that it (Lexapro) was working".  Mood was better.  In the meantime, her mood was a little lower but still better than her previous level prior to starting medication.  She is taking hydroxyzine, taking up to TID prn.  It helps with anxiety.  No ADE on med.  She is sleeping okay.  She went to her grandson's chorus recital.  She is checking on counseling appointments.    She hasn't need any inhaler recently.  D/w pt.    Meds, vitals, and allergies reviewed.   ROS: Per HPI unless specifically indicated in ROS section   Nad ncat She is smiling more today.   Rrr Ctab Abd soft Ext w/o edema.

## 2021-11-05 NOTE — Patient Instructions (Signed)
Keep going as is.  Update me as needed.  I'll check on the prolia appointment.   Take care.  Glad to see you.

## 2021-11-05 NOTE — Telephone Encounter (Signed)
Tonia Ghent, MD  11/03/2021  2:56 PM EDT Back to Top    Please schedule her for prolia restart- please start the PA if needed.    Benefits submitted-pending been on fosamax and evista in the past [employer] ID [id number]

## 2021-11-08 NOTE — Assessment & Plan Note (Signed)
It does look like she is improving.  She is smiling more.  Continue Lexapro with as needed hydroxyzine.  I would expect that she could still get some added benefit from Lexapro in the upcoming weeks.  Still okay for outpatient follow-up.  She should hear about a counseling appointment in the near future.  She will update me as needed.

## 2021-11-08 NOTE — Assessment & Plan Note (Signed)
I sent a note to staff to check on Prolia follow-up.

## 2021-11-09 NOTE — Telephone Encounter (Signed)
OOP cost is $35. Patient advised. Lab appointment on 11/11/21 and NV 11/17/21 PA done approved 06/13/21-06/12/22

## 2021-11-09 NOTE — Addendum Note (Signed)
Addended by: Kris Mouton on: 11/09/2021 04:01 PM   Modules accepted: Orders

## 2021-11-11 ENCOUNTER — Telehealth: Payer: Self-pay

## 2021-11-11 ENCOUNTER — Other Ambulatory Visit (INDEPENDENT_AMBULATORY_CARE_PROVIDER_SITE_OTHER): Payer: Medicare PPO

## 2021-11-11 DIAGNOSIS — M810 Age-related osteoporosis without current pathological fracture: Secondary | ICD-10-CM

## 2021-11-11 LAB — BASIC METABOLIC PANEL WITH GFR
BUN: 20 mg/dL (ref 6–23)
CO2: 32 meq/L (ref 19–32)
Calcium: 9.6 mg/dL (ref 8.4–10.5)
Chloride: 102 meq/L (ref 96–112)
Creatinine, Ser: 0.9 mg/dL (ref 0.40–1.20)
GFR: 62.69 mL/min
Glucose, Bld: 115 mg/dL — ABNORMAL HIGH (ref 70–99)
Potassium: 4.4 meq/L (ref 3.5–5.1)
Sodium: 141 meq/L (ref 135–145)

## 2021-11-11 NOTE — Telephone Encounter (Signed)
Returned her call. Scheduled appt to see Dr. [name] on 7/25 at 9 am. She is aware of appt.

## 2021-11-11 NOTE — Telephone Encounter (Signed)
-----   Message from Heath Lark, MD sent at 11/11/2021  8:57 AM EDT ----- Can you get her to call for Ct to be done after port flush and labs on 7/24? I can see her on 7/25 30 mins to review scan

## 2021-11-11 NOTE — Telephone Encounter (Signed)
Called and left below message. Left radiology scheduling # and ask her to call to schedule. Ask her to call the office back after she schedules or if she needs help.

## 2021-11-12 DIAGNOSIS — N3941 Urge incontinence: Secondary | ICD-10-CM | POA: Diagnosis not present

## 2021-11-12 DIAGNOSIS — N812 Incomplete uterovaginal prolapse: Secondary | ICD-10-CM | POA: Diagnosis not present

## 2021-11-12 DIAGNOSIS — N952 Postmenopausal atrophic vaginitis: Secondary | ICD-10-CM | POA: Diagnosis not present

## 2021-11-12 DIAGNOSIS — Z4689 Encounter for fitting and adjustment of other specified devices: Secondary | ICD-10-CM | POA: Diagnosis not present

## 2021-11-16 NOTE — Telephone Encounter (Signed)
CrCl is  50.65 mL/min. Calcium normal at 9.6

## 2021-11-17 ENCOUNTER — Ambulatory Visit (INDEPENDENT_AMBULATORY_CARE_PROVIDER_SITE_OTHER): Payer: Medicare PPO

## 2021-11-17 DIAGNOSIS — M810 Age-related osteoporosis without current pathological fracture: Secondary | ICD-10-CM

## 2021-11-17 MED ORDER — DENOSUMAB 60 MG/ML ~~LOC~~ SOSY
60.0000 mg | PREFILLED_SYRINGE | Freq: Once | SUBCUTANEOUS | Status: AC
  Administered 2021-11-17: 60 mg via SUBCUTANEOUS

## 2021-11-17 NOTE — Progress Notes (Signed)
Per orders of Dr. Danise Mina, in Dr. Carole Civil absence, an injection of Prolia given by Loreen Freud. Patient tolerated injection well.

## 2021-12-06 ENCOUNTER — Inpatient Hospital Stay: Payer: Medicare PPO | Attending: Hematology and Oncology

## 2021-12-06 ENCOUNTER — Other Ambulatory Visit: Payer: Self-pay

## 2021-12-06 VITALS — BP 155/79 | HR 61 | Temp 98.3°F | Resp 18

## 2021-12-06 DIAGNOSIS — N814 Uterovaginal prolapse, unspecified: Secondary | ICD-10-CM | POA: Diagnosis not present

## 2021-12-06 DIAGNOSIS — N952 Postmenopausal atrophic vaginitis: Secondary | ICD-10-CM | POA: Diagnosis not present

## 2021-12-06 DIAGNOSIS — Z452 Encounter for adjustment and management of vascular access device: Secondary | ICD-10-CM | POA: Insufficient documentation

## 2021-12-06 DIAGNOSIS — N3941 Urge incontinence: Secondary | ICD-10-CM | POA: Diagnosis not present

## 2021-12-06 DIAGNOSIS — Z8571 Personal history of Hodgkin lymphoma: Secondary | ICD-10-CM | POA: Insufficient documentation

## 2021-12-06 DIAGNOSIS — Z4689 Encounter for fitting and adjustment of other specified devices: Secondary | ICD-10-CM | POA: Diagnosis not present

## 2021-12-06 DIAGNOSIS — C8118 Nodular sclerosis classical Hodgkin lymphoma, lymph nodes of multiple sites: Secondary | ICD-10-CM

## 2021-12-06 MED ORDER — SODIUM CHLORIDE 0.9% FLUSH: 10.0000 mL | Freq: Once | INTRAVENOUS | Status: DC

## 2021-12-06 MED ORDER — HEPARIN SOD (PORK) LOCK FLUSH 100 UNIT/ML IV SOLN: 500.0000 [IU] | Freq: Once | INTRAVENOUS | Status: DC

## 2021-12-21 ENCOUNTER — Telehealth: Payer: Self-pay

## 2021-12-21 ENCOUNTER — Other Ambulatory Visit: Payer: Self-pay

## 2021-12-21 MED ORDER — ACCU-CHEK AVIVA PLUS VI STRP: ORAL_STRIP | 5 refills | Status: AC

## 2021-12-21 NOTE — Telephone Encounter (Signed)
Erx has been sent and patient notified.

## 2021-12-21 NOTE — Telephone Encounter (Signed)
[place] Night - Client Nonclinical Telephone Record  [facility] Client [facility] Night - Client Client Site [place] - Night Provider Renford Dills - MD Contact Type Call Who Is Calling Patient / Member / Family / Caregiver Caller Name Shelbee Apgar Phone Number [phone] Patient Name Cathy [name] Patient DOB [birth year] Call Type Message Only Information Provided Reason for Call Medication Question / Request Initial Comment Caller states she would like a Rx for the Accucheck Abiva Plus test strips. Additional Comment Office hours provided Disp. Time Disposition Final User 12/21/2021 7:56:45 AM General Information Provided Yes Achilles Dunk Call Closed By: Achilles Dunk Transaction Date/Time: 12/21/2021 7:53:45 AM (ET

## 2021-12-29 ENCOUNTER — Telehealth: Payer: Self-pay | Admitting: Family Medicine

## 2021-12-29 NOTE — Telephone Encounter (Signed)
LM with patient sister, to have pt rtn my call to schedule AWV with [facility] call back # [phone]

## 2022-01-03 ENCOUNTER — Inpatient Hospital Stay: Payer: Medicare PPO | Attending: Hematology and Oncology

## 2022-01-03 ENCOUNTER — Other Ambulatory Visit: Payer: Self-pay

## 2022-01-03 ENCOUNTER — Ambulatory Visit (HOSPITAL_COMMUNITY)
Admission: RE | Admit: 2022-01-03 | Discharge: 2022-01-03 | Disposition: A | Discharge status: Home / Self Care | Payer: Medicare PPO | Source: Ambulatory Visit | Attending: Hematology and Oncology | Admitting: Hematology and Oncology

## 2022-01-03 DIAGNOSIS — Z7951 Long term (current) use of inhaled steroids: Secondary | ICD-10-CM | POA: Insufficient documentation

## 2022-01-03 DIAGNOSIS — R590 Localized enlarged lymph nodes: Secondary | ICD-10-CM | POA: Insufficient documentation

## 2022-01-03 DIAGNOSIS — C8118 Nodular sclerosis classical Hodgkin lymphoma, lymph nodes of multiple sites: Secondary | ICD-10-CM

## 2022-01-03 DIAGNOSIS — Z7982 Long term (current) use of aspirin: Secondary | ICD-10-CM | POA: Insufficient documentation

## 2022-01-03 DIAGNOSIS — Z8571 Personal history of Hodgkin lymphoma: Secondary | ICD-10-CM | POA: Insufficient documentation

## 2022-01-03 DIAGNOSIS — Z452 Encounter for adjustment and management of vascular access device: Secondary | ICD-10-CM | POA: Insufficient documentation

## 2022-01-03 DIAGNOSIS — Z79899 Other long term (current) drug therapy: Secondary | ICD-10-CM | POA: Insufficient documentation

## 2022-01-03 DIAGNOSIS — R918 Other nonspecific abnormal finding of lung field: Secondary | ICD-10-CM | POA: Diagnosis not present

## 2022-01-03 DIAGNOSIS — N83202 Unspecified ovarian cyst, left side: Secondary | ICD-10-CM | POA: Diagnosis not present

## 2022-01-03 DIAGNOSIS — I700 Atherosclerosis of aorta: Secondary | ICD-10-CM | POA: Diagnosis not present

## 2022-01-03 DIAGNOSIS — M4317 Spondylolisthesis, lumbosacral region: Secondary | ICD-10-CM | POA: Diagnosis not present

## 2022-01-03 DIAGNOSIS — C8190 Hodgkin lymphoma, unspecified, unspecified site: Secondary | ICD-10-CM | POA: Diagnosis not present

## 2022-01-03 LAB — CBC WITH DIFFERENTIAL (CANCER CENTER ONLY)
Abs Immature Granulocytes: 0.02 10*3/uL (ref 0.00–0.07)
Basophils Absolute: 0 10*3/uL (ref 0.0–0.1)
Basophils Relative: 1 %
Eosinophils Absolute: 0.2 10*3/uL (ref 0.0–0.5)
Eosinophils Relative: 3 %
HCT: 42.5 % (ref 36.0–46.0)
Hemoglobin: 14 g/dL (ref 12.0–15.0)
Immature Granulocytes: 0 %
Lymphocytes Relative: 19 %
Lymphs Abs: 1.5 10*3/uL (ref 0.7–4.0)
MCH: 30 pg (ref 26.0–34.0)
MCHC: 32.9 g/dL (ref 30.0–36.0)
MCV: 91.2 fL (ref 80.0–100.0)
Monocytes Absolute: 0.6 10*3/uL (ref 0.1–1.0)
Monocytes Relative: 8 %
Neutro Abs: 5.4 10*3/uL (ref 1.7–7.7)
Neutrophils Relative %: 69 %
Platelet Count: 213 10*3/uL (ref 150–400)
RBC: 4.66 MIL/uL (ref 3.87–5.11)
RDW: 12.8 % (ref 11.5–15.5)
WBC Count: 7.7 10*3/uL (ref 4.0–10.5)
nRBC: 0 % (ref 0.0–0.2)

## 2022-01-03 LAB — CMP (CANCER CENTER ONLY)
ALT: 10 U/L (ref 0–44)
AST: 16 U/L (ref 15–41)
Albumin: 4.3 g/dL (ref 3.5–5.0)
Alkaline Phosphatase: 36 U/L — ABNORMAL LOW (ref 38–126)
Anion gap: 5 (ref 5–15)
BUN: 13 mg/dL (ref 8–23)
CO2: 31 mmol/L (ref 22–32)
Calcium: 9.4 mg/dL (ref 8.9–10.3)
Chloride: 104 mmol/L (ref 98–111)
Creatinine: 0.74 mg/dL (ref 0.44–1.00)
GFR, Estimated: >60 mL/min (ref >60)
Glucose, Bld: 109 mg/dL — ABNORMAL HIGH (ref 70–99)
Potassium: 4.4 mmol/L (ref 3.5–5.1)
Sodium: 140 mmol/L (ref 135–145)
Total Bilirubin: 0.6 mg/dL (ref 0.3–1.2)
Total Protein: 6.6 g/dL (ref 6.5–8.1)

## 2022-01-03 LAB — LACTATE DEHYDROGENASE: LDH: 131 U/L (ref 98–192)

## 2022-01-03 MED ORDER — SODIUM CHLORIDE 0.9% FLUSH
10.0000 mL | Freq: Once | INTRAVENOUS | Status: AC
  Administered 2022-01-03: 10 mL

## 2022-01-03 MED ORDER — HEPARIN SOD (PORK) LOCK FLUSH 100 UNIT/ML IV SOLN
INTRAVENOUS | Status: AC
  Administered 2022-01-03: 500 [IU]
  Filled 2022-01-03: qty 5

## 2022-01-03 MED ORDER — IOHEXOL 300 MG/ML  SOLN
100.0000 mL | Freq: Once | INTRAMUSCULAR | Status: AC | PRN
  Administered 2022-01-03: 100 mL via INTRAVENOUS

## 2022-01-03 MED ORDER — SODIUM CHLORIDE (PF) 0.9 % IJ SOLN
INTRAMUSCULAR | Status: AC
  Filled 2022-01-03: qty 50

## 2022-01-03 NOTE — Assessment & Plan Note (Signed)
I have reviewed multiple CT imaging with the patient She has no signs of cancer recurrence We discussed follow-up and port maintenance

## 2022-01-04 ENCOUNTER — Inpatient Hospital Stay: Payer: Medicare PPO | Admitting: Hematology and Oncology

## 2022-01-04 ENCOUNTER — Encounter: Payer: Self-pay | Admitting: Hematology and Oncology

## 2022-01-04 DIAGNOSIS — Z7982 Long term (current) use of aspirin: Secondary | ICD-10-CM | POA: Diagnosis not present

## 2022-01-04 DIAGNOSIS — Z8571 Personal history of Hodgkin lymphoma: Secondary | ICD-10-CM | POA: Diagnosis not present

## 2022-01-04 DIAGNOSIS — Z79899 Other long term (current) drug therapy: Secondary | ICD-10-CM | POA: Diagnosis not present

## 2022-01-04 DIAGNOSIS — Z452 Encounter for adjustment and management of vascular access device: Secondary | ICD-10-CM | POA: Diagnosis not present

## 2022-01-04 DIAGNOSIS — Z7951 Long term (current) use of inhaled steroids: Secondary | ICD-10-CM | POA: Diagnosis not present

## 2022-01-04 DIAGNOSIS — R590 Localized enlarged lymph nodes: Secondary | ICD-10-CM | POA: Diagnosis not present

## 2022-01-04 DIAGNOSIS — C8118 Nodular sclerosis classical Hodgkin lymphoma, lymph nodes of multiple sites: Secondary | ICD-10-CM

## 2022-01-04 NOTE — Progress Notes (Signed)
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   All questions were answered. The patient knows to call the clinic with any problems, questions or concerns. The total time spent in the appointment was 20 minutes encounter with patients including review of chart and various tests results, discussions about plan of care and coordination of care plan   Heath Lark, MD 01/04/2022 10:38 AM  INTERVAL HISTORY: Please see below for problem oriented charting. she returns for surveillance follow-up due to history of Hodgkin lymphoma She is doing well No new lymphadenopathy  REVIEW OF SYSTEMS:   Constitutional: Denies fevers, chills or abnormal weight loss Eyes: Denies blurriness of vision Ears, nose, mouth, throat, and face: Denies mucositis or sore throat Respiratory: Denies cough, dyspnea or wheezes Cardiovascular: Denies palpitation, chest discomfort or lower extremity swelling Gastrointestinal:  Denies nausea, heartburn or change in bowel habits Skin: Denies abnormal skin rashes Lymphatics: Denies new lymphadenopathy or easy bruising Neurological:Denies numbness, tingling or new weaknesses Behavioral/Psych: Mood is stable, no new changes  All other systems were reviewed with the patient and are negative.  I have reviewed the past medical history, past surgical history, social history and family history with the patient and they are unchanged from  previous note.  ALLERGIES:  is allergic to codeine.  MEDICATIONS:  Current Outpatient Medications  Medication Sig Dispense Refill   Acetaminophen (TYLENOL) 325 MG CAPS Take by mouth.     albuterol (VENTOLIN HFA) 108 (90 Base) MCG/ACT inhaler Inhale 2 puffs into the lungs every 6 (six) hours as needed for wheezing or shortness of breath. 8 g 2   aspirin EC 81 MG tablet Take 81 mg by mouth daily.     atorvastatin (LIPITOR) 20 MG tablet Take 1 tablet (20 mg total) by mouth daily. 90 tablet 1   cetirizine (ZYRTEC) 10 MG tablet Take 1 tablet (10 mg total) by mouth daily. 90 tablet 1   Cholecalciferol (VITAMIN D3) 25 MCG (1000 UT) CAPS Take 1 capsule (1,000 Units total) by mouth daily.     denosumab (PROLIA) 60 MG/ML SOSY injection Inject 60 mg into the skin every 6 (six) months.     escitalopram (LEXAPRO) 20 MG tablet Take 1 tablet (20 mg total) by mouth daily. 90 tablet 1   estradiol (ESTRACE) 0.1 MG/GM vaginal cream Discard plastic applicator. Insert blueberry size amount of cream on finger in vagina daily x1 week then 2x per week.     fluticasone furoate-vilanterol (BREO ELLIPTA) 100-25 MCG/ACT AEPB Inhale 1 puff into the lungs daily. (Patient not taking: Reported on 11/05/2021) 60 each 5   glucose blood (ACCU-CHEK AVIVA PLUS) test strip Use to Monitor blood sugars daily. DX. E11.9 100 each 5   hydrochlorothiazide (MICROZIDE) 12.5 MG capsule Take 1 capsule (12.5 mg total) by mouth daily. 90 capsule 1   hydrOXYzine (ATARAX) 10 MG tablet Take 1 tablet (10 mg total) by mouth every 8 (eight) hours as needed for anxiety. 90 tablet  5   lidocaine-prilocaine (EMLA) cream Apply to affected area once 30 g 3   losartan (COZAAR) 100 MG tablet Take 1 tablet (100 mg total) by mouth daily. 90 tablet 1   LUTEIN PO Take by mouth.     metoprolol succinate (TOPROL-XL) 50 MG 24 hr tablet Take 1 tablet (50 mg total) by mouth daily. Take with or immediately following a meal. 90 tablet 1   Multiple Vitamin  (MULTIVITAMIN) capsule Take 1 capsule by mouth daily.     omeprazole (PRILOSEC) 20 MG capsule Take 1 capsule (20 mg total) by mouth daily. 90 capsule 1   Turmeric 450 MG CAPS Take 450 mg by mouth daily.     No current facility-administered medications for this visit.    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 Extensive hypermetabolic lymphadenopathy involving the neck, chest and abdomen as detailed above (Deauville 5). I do not see any pelvic disease. 2. Possible involvement of the spleen. 3. Osseous involvement is also noted.     09/03/2020 Pathology Results   A. LYMPH NODE, LEFT NECK, EXCISION:  -Lymphoproliferative disorder consistent with classical Hodgkin lymphoma -See comment   COMMENT:   The sections show effacement of the lymph nodal architecture by a primarily nodular lymphoproliferative process.  In the more cellular areas, the nodules are composed of a mixture of small lymphocytes, plasma cells, abundant histiocytes in addition to atypical large mononuclear and multi-lobated lymphoid appearing cells with variably prominent nucleoli.  The nodules are surrounded by dense collagenous fibrosis.  In the less cellular areas, there appears to be a fibroblastic proliferation and a much more depleted cellular appearance but scattering of large atypical lymphoid appearing cells is still seen.   Flow cytometric analysis was performed [facility] [phone]) and shows predominance of T lymphocytes with  nonspecific changes including relative abundance of CD8 positive cells and reversal of the CD4: CD8 ratio.  No monoclonal B-cell population identified.  A battery of immunohistochemical stains was performed and shows that the large atypical lymphoid appearing cells are positive for CD30, Mum-1, PAX 5 (weak), CD23 in addition to variable but generally weak positivity for CD79a and CD20.  Only scattered large atypical lymphoid cells show weak Golgi positivity for CD15.  The large atypical lymphoid cells are negative for LCA, CD10, CD3, CD5, EMA, ALK protein and EBV in situ  hybridization.  The small lymphoid cells in the background show a mixture of T and B cells with predominance of T cells.  T cells show a mixture of CD4 and CD8 positive cells with slight predominance of the latter.  The overall findings are most consistent with classical Hodgkin lymphoma, nodular sclerosis subtype.      09/21/2020 Procedure   Placement of a subcutaneous port device. Catheter tip at the superior cavoatrial junction.   09/25/2020 Echocardiogram   1. Left ventricular ejection fraction, by estimation, is 60 to 65%. The left ventricle has normal function. The left ventricle has no regional wall motion abnormalities. There is mild concentric left ventricular hypertrophy. Left ventricular diastolic parameters are consistent  with Grade I diastolic dysfunction (impaired relaxation). The average left ventricular global longitudinal strain is -19.6 %. The global longitudinal strain is normal.  2. Right ventricular systolic function is normal. The right ventricular size is normal.  3. A small pericardial effusion is present. The pericardial effusion is surrounding the apex and anterior to the right ventricle. There is no evidence of cardiac tamponade.  4. The mitral valve is normal in structure. No evidence of mitral valve regurgitation.  5. The aortic valve is tricuspid. Aortic valve regurgitation is not visualized. Mild aortic  valve sclerosis is present, with no evidence of aortic valve stenosis.  6. The inferior vena cava is normal in size with greater than 50% respiratory variability, suggesting right atrial pressure of 3 mmHg.    Chemotherapy    Patient is on Treatment Plan: HODGKINS LYMPHOMA  A + AVD Q28D       11/23/2020 PET scan   1. Interval significant response to therapy. The previously demonstrated hypermetabolic adenopathy in the neck, chest and abdomen has nearly completely resolved. Deauville 1 and 2. Slightly greater residual metabolic activity within hilar lymph nodes is less specific and may be reactive. 2. Resolution of previously demonstrated focal hypermetabolic activity in the spleen and bones. Diffuse bone marrow activity attributed to treatment response. 3. Stable incidental findings as detailed above.   01/11/2021 Echocardiogram    1. Stable EF and GLS compared to 09/25/20. Left ventricular ejection fraction, by estimation, is 60 to 65%. The left ventricle has normal function. The left ventricle has no regional wall motion abnormalities. Left ventricular diastolic parameters are consistent with Grade I diastolic dysfunction (impaired relaxation). The average left ventricular global longitudinal strain is -19.7 %. The global longitudinal strain is normal.  2. Right ventricular systolic function is normal. The right ventricular size is normal.  3. Left atrial size was mildly dilated.  4. The mitral valve is abnormal. Trivial mitral valve regurgitation. No evidence of mitral stenosis. Moderate mitral annular calcification.  5. The aortic valve is calcified. There is moderate calcification of the aortic valve. Aortic valve regurgitation is not visualized. Mild to moderate aortic valve sclerosis/calcification is present, without any evidence of aortic stenosis.  6. The inferior vena cava is normal in size with greater than 50% respiratory variability, suggesting right atrial pressure of 3 mmHg.      04/01/2021 PET scan   Resolution of bilateral hilar activity and decreased size of LEFT retroperitoneal lymph node with stable SUV value less than mediastinal blood pool.   Interval development of pulmonary nodules with increased metabolic activity. Findings are nonspecific. Nodule in the RIGHT upper lobe has a branching pattern that could be seen in the setting of infectious or inflammatory changes. This is considered due to relatively rapid development. There is however moderate, relatively higher uptake in the LEFT lower lobe raising the question of neoplasm including lymphoma.   Three-vessel coronary artery disease.   Aortic Atherosclerosis (ICD10-I70.0).     07/01/2021 Imaging   1. Resolution of mediastinal and hilar lymphadenopathy. Minimal residual matted soft tissue density consistent with treated disease. No findings suspicious for residual or recurrent lymphoma. 2. Small branching nodular lesion in the right upper lobe and subpleural nodularity at the left lung base are stable. Likely benign process. Attention on future scans is suggested. 3. No acute pulmonary findings.   Aortic Atherosclerosis (ICD10-I70.0).     01/03/2022 Imaging   Stable 8 mm left para-aortic lymph node. No new or progressive disease.   Stable subpleural  nodularity in the posterior left lower lobe, most likely benign. Recommend continued attention on follow-up imaging.   Stable 4.8 cm benign-appearing left adnexal cyst. Recommend continued attention on follow-up imaging.   Aortic Atherosclerosis (ICD10-I70.0).     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[place])   09/29/2020 - 03/02/2021 Chemotherapy   Patient is on Treatment Plan : HODGKINS LYMPHOMA  A + AVD q28d       PHYSICAL EXAMINATION: ECOG PERFORMANCE STATUS: 0 - Asymptomatic  Vitals:   01/04/22 0852  BP: 125/61  Pulse: 63  Resp: 18  Temp: (!) 97.5 F (36.4 C)  SpO2: 98%    Filed Weights   01/04/22 0852  Weight: 128 lb 8 oz (58.3 kg)    GENERAL:alert, no distress and comfortable NEURO: alert & oriented x 3 with fluent speech, no focal motor/sensory deficits  LABORATORY DATA:  I have reviewed the data as listed    Component Value Date/Time   NA 140 01/03/2022 0811   NA 143 08/15/2016 1037   K 4.4 01/03/2022 0811   K 4.8 08/15/2016 1037   CL 104 01/03/2022 0811   CL 103 07/16/2012 1304   CO2 31 01/03/2022 0811   CO2 27 08/15/2016 1037   GLUCOSE 109 (H) 01/03/2022 0811   GLUCOSE 111 08/15/2016 1037   GLUCOSE 112 (H) 07/16/2012 1304   BUN 13 01/03/2022 0811   BUN 12.7 08/15/2016 1037   CREATININE 0.74 01/03/2022 0811   CREATININE 0.95 (H) 08/10/2020 1041   CREATININE 0.9 08/15/2016 1037   CALCIUM 9.4 01/03/2022 0811   CALCIUM 10.3 08/13/2020 1117   CALCIUM 10.5 (H) 08/15/2016 1037   PROT 6.6 01/03/2022 0811   PROT 7.9 08/15/2016 1037   ALBUMIN 4.3 01/03/2022 0811   ALBUMIN 4.5 08/15/2016 1037   AST 16 01/03/2022 0811   AST 30 08/15/2016 1037   ALT 10 01/03/2022 0811   ALT 29 08/15/2016 1037   ALKPHOS 36 (L) 01/03/2022 0811   ALKPHOS 67 08/15/2016 1037   BILITOT 0.6 01/03/2022 0811   BILITOT 0.80 08/15/2016 1037   GFRNONAA >60 01/03/2022 0811   GFRNONAA 62 12/05/2017 1000   GFRAA 72 12/05/2017 1000    No results found for: "SPEP", "UPEP"  Lab Results  Component Value Date   WBC 7.7 01/03/2022   NEUTROABS 5.4 01/03/2022   HGB 14.0 01/03/2022   HCT 42.5 01/03/2022   MCV 91.2 01/03/2022   PLT 213 01/03/2022      Chemistry      Component Value Date/Time   NA 140 01/03/2022 0811   NA 143 08/15/2016 1037   K 4.4 01/03/2022 0811   K 4.8 08/15/2016 1037   CL 104 01/03/2022 0811   CL 103 07/16/2012 1304   CO2 31 01/03/2022 0811   CO2 27 08/15/2016 1037   BUN 13 01/03/2022 0811   BUN 12.7 08/15/2016 1037   CREATININE 0.74 01/03/2022 0811   CREATININE 0.95 (H) 08/10/2020 1041   CREATININE 0.9 08/15/2016 1037       Component Value Date/Time   CALCIUM 9.4 01/03/2022 0811   CALCIUM 10.3 08/13/2020 1117   CALCIUM 10.5 (H) 08/15/2016 1037   ALKPHOS 36 (L) 01/03/2022 0811   ALKPHOS 67 08/15/2016 1037   AST 16 01/03/2022 0811   AST 30 08/15/2016 1037   ALT 10 01/03/2022 0811   ALT 29 08/15/2016 1037   BILITOT 0.6 01/03/2022 0811   BILITOT 0.80 08/15/2016 1037  RADIOGRAPHIC STUDIES: I have reviewed multiple imaging studies with the patient I have personally reviewed the radiological images as listed and agreed with the findings in the report. CT CHEST ABDOMEN PELVIS W CONTRAST  Result Date: 01/03/2022 CLINICAL DATA:  Follow-up Hodgkin lymphoma. * Tracking Code: BO * EXAM: CT CHEST, ABDOMEN, AND PELVIS WITH CONTRAST TECHNIQUE: Multidetector CT imaging of the chest, abdomen and pelvis was performed following the standard protocol during bolus administration of intravenous contrast. RADIATION DOSE REDUCTION: This exam was performed according to the departmental dose-optimization program which includes automated exposure control, adjustment of the mA and/or kV according to patient size and/or use of iterative reconstruction technique. CONTRAST:  130mL OMNIPAQUE IOHEXOL 300 MG/ML  SOLN COMPARISON:  Chest CT on 07/01/2021, and PET-CT on 03/31/2021 FINDINGS: CT CHEST FINDINGS Cardiovascular: No acute findings. Aortic and coronary atherosclerotic calcification incidentally noted. Mediastinum/Lymph Nodes: No masses or pathologically enlarged lymph nodes identified. Lungs/Pleura: Previously seen branching nodular density in the anterior right upper lobe has resolved since previous study. Subpleural nodularity in the posterior left lower lobe is also stable. No suspicious pulmonary nodules or masses identified. No evidence of infiltrate or pleural effusion. Musculoskeletal:  No suspicious bone lesions identified. CT ABDOMEN AND PELVIS FINDINGS Hepatobiliary: No masses identified. Prior cholecystectomy. No evidence of  biliary obstruction. Pancreas:  No mass or inflammatory changes. Spleen:  Within normal limits in size and appearance. Adrenals/Urinary tract:  No masses or hydronephrosis. Stomach/Bowel: No evidence of obstruction, inflammatory process, or abnormal fluid collections. Vascular/Lymphatic: 8 mm left para-aortic lymph node remains stable. No pathologically enlarged lymph nodes identified. No acute vascular findings. Aortic atherosclerotic calcification incidentally noted. Reproductive: Normal appearing uterus. Benign-appearing cyst is again seen in the left adnexa measuring 4.8 x 3.5 cm. Pessary noted within the vagina. Other:  None. Musculoskeletal: No suspicious bone lesions identified. 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 IMPRESSION: Stable 8 mm left para-aortic lymph node. No new or progressive disease. Stable subpleural nodularity in the posterior left lower lobe, most likely benign. Recommend continued attention on follow-up imaging. Stable 4.8 cm benign-appearing left adnexal cyst. Recommend continued attention on follow-up imaging. Aortic Atherosclerosis (ICD10-I70.0). Electronically Signed   By: Marlaine Hind M.D.   On: 01/03/2022 13:16

## 2022-01-31 ENCOUNTER — Inpatient Hospital Stay: Payer: Medicare PPO | Attending: Hematology and Oncology

## 2022-01-31 ENCOUNTER — Other Ambulatory Visit: Payer: Self-pay

## 2022-01-31 DIAGNOSIS — Z452 Encounter for adjustment and management of vascular access device: Secondary | ICD-10-CM | POA: Diagnosis not present

## 2022-01-31 DIAGNOSIS — C8118 Nodular sclerosis classical Hodgkin lymphoma, lymph nodes of multiple sites: Secondary | ICD-10-CM | POA: Diagnosis not present

## 2022-01-31 MED ORDER — HEPARIN SOD (PORK) LOCK FLUSH 100 UNIT/ML IV SOLN
500.0000 [IU] | Freq: Once | INTRAVENOUS | Status: AC
  Administered 2022-01-31: 500 [IU]

## 2022-01-31 MED ORDER — SODIUM CHLORIDE 0.9% FLUSH
10.0000 mL | Freq: Once | INTRAVENOUS | Status: AC
  Administered 2022-01-31: 10 mL

## 2022-02-07 ENCOUNTER — Telehealth: Payer: Self-pay | Admitting: Family Medicine

## 2022-02-07 NOTE — Telephone Encounter (Signed)
LMTCB; I need to speak with this patient when she calls back.

## 2022-02-07 NOTE — Telephone Encounter (Signed)
Please talk to me and then see about getting patient in for a follow up appointment.  It looks like she is due. . Thanks.

## 2022-02-09 NOTE — Telephone Encounter (Signed)
Patient called back. She stated she was doing ok. I advised patient that she was due for f/u. Patient stated that was fine but could not schedule anything this week. She states she will call back next week to schedule appt.

## 2022-02-09 NOTE — Telephone Encounter (Signed)
LMTCB

## 2022-02-09 NOTE — Telephone Encounter (Signed)
Noted. Thanks.

## 2022-02-17 ENCOUNTER — Encounter: Payer: Self-pay | Admitting: Family Medicine

## 2022-02-17 ENCOUNTER — Ambulatory Visit: Payer: Medicare PPO | Admitting: Family Medicine

## 2022-02-17 VITALS — BP 132/72 | HR 68 | Temp 97.4°F | Ht 63.0 in | Wt 120.5 lb

## 2022-02-17 DIAGNOSIS — R4586 Emotional lability: Secondary | ICD-10-CM

## 2022-02-17 DIAGNOSIS — F419 Anxiety disorder, unspecified: Secondary | ICD-10-CM

## 2022-02-17 LAB — VITAMIN B12: Vitamin B-12: 223 pg/mL (ref 211–911)

## 2022-02-17 LAB — TSH: TSH: 1.02 u[IU]/mL (ref 0.35–5.50)

## 2022-02-17 MED ORDER — ESCITALOPRAM OXALATE 20 MG PO TABS
10.0000 mg | ORAL_TABLET | Freq: Every day | ORAL | Status: DC
Start: 2022-02-17 — End: 2022-04-08

## 2022-02-17 NOTE — Progress Notes (Unsigned)
Mood follow up.  Weight is down.  Still on lexapro.  She thought it was working but anxiety continued.  Then anxiety got worse.  She wants to spend time in bed, doesn't want to get out of bed.  No SI/HI.  Still living at home by herself.  Taking hydroxyzine as needed, minimal effect.    Not yet in counseling.  D/w pt about trying counseling in person.  D/w pt about est care here with counseling.  Referral placed.    She couldn't get prolia covered, I told her I would check on that.   Meds, vitals, and allergies reviewed.   ROS: Per HPI unless specifically indicated in ROS section   GEN: nad, alert and oriented HEENT: ncat, affect slightly flat. NECK: supple w/o LA CV: rrr.  PULM: ctab, no inc wob ABD: soft, +bs EXT: no edema SKIN: Well-perfused. Speech normal.  No tremor.  30 minutes were devoted to patient care in this encounter (this includes time spent reviewing the patient's file/history, interviewing and examining the patient, counseling/reviewing plan with patient).

## 2022-02-17 NOTE — Patient Instructions (Addendum)
Let me know if you have trouble getting set up with counseling here.   Take care.  Glad to see you. Go to the lab on the way out.   If you have mychart we'll likely use that to update you.   Try cutting the lexapro back to '10mg'$  a day.    If labs are fine then likely would be reasonable to try buspar.

## 2022-02-20 ENCOUNTER — Telehealth: Payer: Self-pay | Admitting: Family Medicine

## 2022-02-20 ENCOUNTER — Other Ambulatory Visit: Payer: Self-pay | Admitting: Family Medicine

## 2022-02-20 DIAGNOSIS — R4586 Emotional lability: Secondary | ICD-10-CM

## 2022-02-20 MED ORDER — VITAMIN B-12 1000 MCG PO TABS: 1000.0000 ug | ORAL_TABLET | Freq: Every day | ORAL | Status: AC

## 2022-02-20 MED ORDER — BUSPIRONE HCL 5 MG PO TABS: 5.0000 mg | ORAL_TABLET | Freq: Two times a day (BID) | ORAL | 2 refills | Status: DC

## 2022-02-20 NOTE — Telephone Encounter (Signed)
Patient, she was not able to get her Prolia covered.  What is the status on that?  Please let me know.  Thanks.

## 2022-02-20 NOTE — Assessment & Plan Note (Signed)
Discussed options.  It seems like the lower dose of Lexapro helped but then she did not do as well on the higher dose.  Discussed options.  Decrease Lexapro to 10 mg a day. If labs are fine then likely would be reasonable to try buspar.   Okay for outpatient follow-up.  No suicidal homicidal intent.  See notes on labs.

## 2022-02-21 NOTE — Telephone Encounter (Signed)
Per Cathy [name] was paid in full. Spoke with patient. Patient states she received mail from insurance in June sometime stating they denied coverage. Let patient know that maybe this was before they received certain information from the claim department but as of today everything is paid in full. Advised patient to let us know if she has any more information about this after our discussion today. Nothing further is needed. FYI to PCP

## 2022-02-21 NOTE — Telephone Encounter (Signed)
Sent message to [facility] to check on claim status.

## 2022-02-21 NOTE — Telephone Encounter (Signed)
Noted. Thanks.

## 2022-03-10 ENCOUNTER — Other Ambulatory Visit: Payer: Self-pay | Admitting: Nurse Practitioner

## 2022-03-10 ENCOUNTER — Other Ambulatory Visit: Payer: Self-pay | Admitting: Family Medicine

## 2022-03-10 DIAGNOSIS — I10 Essential (primary) hypertension: Secondary | ICD-10-CM

## 2022-03-23 ENCOUNTER — Ambulatory Visit (HOSPITAL_COMMUNITY)
Admission: EM | Admit: 2022-03-23 | Discharge: 2022-03-23 | Disposition: A | Discharge status: Home / Self Care | Payer: Medicare PPO | Attending: Psychiatry | Admitting: Psychiatry

## 2022-03-23 DIAGNOSIS — F4321 Adjustment disorder with depressed mood: Secondary | ICD-10-CM

## 2022-03-23 DIAGNOSIS — Z602 Problems related to living alone: Secondary | ICD-10-CM | POA: Insufficient documentation

## 2022-03-23 DIAGNOSIS — F419 Anxiety disorder, unspecified: Secondary | ICD-10-CM

## 2022-03-23 NOTE — Discharge Instructions (Signed)

## 2022-03-23 NOTE — BH Assessment (Signed)
Pt reports telling her son today "I wish I could not be here anymore". Pt reports no motivation to do anything. Pt reports feeling depressed for the past 6-8 months. Pt receivces medications from her PCP and she has an upcoming therapy apponintment on 04/11/22. Pt denies SI, HI, AVH and substance use currently. Son reports that pt also made statement this morning about wishing she had pills she could take that wouldnt hurt her. Pt contracts for safety and has supportive son who is also her protective factor.

## 2022-03-23 NOTE — ED Provider Notes (Signed)
Behavioral Health Urgent Care Medical Screening Exam  Patient Name: Cathy [name] MRN: [id number] Date of Evaluation: 03/23/22 Chief Complaint:   Diagnosis:  Final diagnoses:  Anxiety  Adjustment disorder with depressed mood    History of Present illness: Cathy [name] is a 76 y.o. female. Patient presents voluntarily to [facility] behavioral health for walk-in assessment.  Patient is accompanied by her son, [name].  She prefers that time remain present during assessment.  Patient is assessed, face-to-face, by nurse practitioner, seated in assessment area, no acute distress.  She  is alert and oriented, pleasant and cooperative during assessment.  She presents with depressed mood, congruent affect.  Patient states "I made a mistake and told my son "I wish I did not have to be here anymore" this morning."  She denies suicidal ideations currently.  She easily contracts verbally for safety with this Probation officer.  Cathy [name] reports she has been diagnosed with anxiety and depression in January 2023.  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  Primary care provider has prescribed Lexapro, replaced Lexapro feeling it was ineffective with Trintellix 2 weeks ago.  Continues to report no improvement in mood.  She reports symptoms of depression including decreased appetite, anhedonia and depressed mood worsening x10 months.  She is scheduled for initial appointment with outpatient individual counseling on 04/11/2022.  Currently seeking resources for more timely outpatient follow-up.  She denies history of inpatient psychiatric hospitalization.  No family mental health history reported.  She denies homicidal ideations. Denies history of suicide attempts, denies history of nonsuicidal self-harm.   Patient has normal speech and behavior.  She  denies auditory and visual hallucinations.  Patient is able to converse coherently with goal-directed thoughts and no distractibility or  preoccupation.  Denies symptoms of paranoia.  Objectively there is no evidence of psychosis/mania or delusional thinking.  Cathy [name] resides alone in [place].  She denies access to weapons.  She is retired.  She denies alcohol and substance use.  Patient endorses average sleep.  Patient offered support and encouragement.  Discussed observation unit admission and partial or intensive outpatient options, patient declines at this time.  Patient's son, [name], confirms no weapons in home.  He agrees with plan to include outpatient psychiatry follow-up.   Patient and family are educated and verbalize understanding of mental health resources and other crisis services in the community. They are instructed to call 911 and present to the nearest emergency room should patient experience any suicidal/homicidal ideation, auditory/visual/hallucinations, or detrimental worsening of mental health condition.      Psychiatric Specialty Exam  Presentation  General Appearance:Casual; Appropriate for Environment  Eye Contact:Good  Speech:Clear and Coherent; Normal Rate  Speech Volume:Normal  Handedness:Right   Mood and Affect  Mood: Depressed  Affect: Depressed   Thought Process  Thought Processes: Coherent; Goal Directed; Linear  Descriptions of Associations:Intact  Orientation:Full (Time, Place and Person)  Thought Content:Logical; WDL    Hallucinations:None  Ideas of Reference:None  Suicidal Thoughts:No  Homicidal Thoughts:No   Sensorium  Memory: Immediate Good; Recent Good  Judgment: Good  Insight: Good   Executive Functions  Concentration: Good  Attention Span: Good  Recall: Good  Fund of Knowledge: Good  Language: Good   Psychomotor Activity  Psychomotor Activity: Normal   Assets  Assets: Communication Skills; Desire for Improvement; Financial Resources/Insurance; Intimacy; Housing; Leisure Time; Physical Health; Social Support; Resilience   Sleep   Sleep: Good  Number of hours: No data recorded  No data recorded  Physical Exam: Physical Exam Vitals and nursing note reviewed.  Constitutional:      Appearance: Normal appearance. She is well-developed.  HENT:     Head: Normocephalic and atraumatic.     Nose: Nose normal.  Cardiovascular:     Rate and Rhythm: Normal rate.  Pulmonary:     Effort: Pulmonary effort is normal.  Musculoskeletal:        General: Normal range of motion.     Cervical back: Normal range of motion.  Skin:    General: Skin is warm and dry.  Neurological:     Mental Status: She is alert and oriented to person, place, and time.  Psychiatric:        Attention and Perception: Attention and perception normal.        Mood and Affect: Affect normal. Mood is depressed.        Speech: Speech normal.        Behavior: Behavior normal. Behavior is cooperative.        Thought Content: Thought content normal.        Cognition and Memory: Cognition and memory normal.        Judgment: Judgment normal.    Review of Systems  Constitutional: Negative.   HENT: Negative.    Eyes: Negative.   Respiratory: Negative.    Cardiovascular: Negative.   Gastrointestinal: Negative.   Genitourinary: Negative.   Musculoskeletal: Negative.   Skin: Negative.   Neurological: Negative.   Psychiatric/Behavioral:  Positive for depression.    Blood pressure (!) 160/84, pulse 79, height '5\' 5"'$  (1.651 m), weight 110 lb (49.9 kg), SpO2 91 %. Body mass index is 18.3 kg/m.  Musculoskeletal: Strength & Muscle Tone: within normal limits Gait & Station: normal Patient leans: N/A   [place] [facility] Discharge Disposition for Follow up and Recommendations: Based on my evaluation the patient does not appear to have an emergency medical condition and can be discharged with resources and follow up care in outpatient services for Medication Management and Individual Therapy Patient reviewed with Dr. Hampton Abbot. Follow-up with outpatient  psychiatry, resources provided.   Lucky Rathke, FNP 03/23/2022, 12:03 PM

## 2022-03-27 ENCOUNTER — Telehealth: Payer: Self-pay | Admitting: Family Medicine

## 2022-03-27 NOTE — Telephone Encounter (Signed)
Please get update on patient.  Please let me know when she is going to be able to see psychology versus psychiatry.  Thanks.

## 2022-03-28 ENCOUNTER — Other Ambulatory Visit: Payer: Self-pay

## 2022-03-28 ENCOUNTER — Inpatient Hospital Stay: Payer: Medicare PPO | Attending: Hematology and Oncology

## 2022-03-28 DIAGNOSIS — Z452 Encounter for adjustment and management of vascular access device: Secondary | ICD-10-CM | POA: Diagnosis not present

## 2022-03-28 DIAGNOSIS — C8118 Nodular sclerosis classical Hodgkin lymphoma, lymph nodes of multiple sites: Secondary | ICD-10-CM | POA: Diagnosis not present

## 2022-03-28 MED ORDER — SODIUM CHLORIDE 0.9% FLUSH
10.0000 mL | Freq: Once | INTRAVENOUS | Status: AC
  Administered 2022-03-28: 10 mL

## 2022-03-28 MED ORDER — HEPARIN SOD (PORK) LOCK FLUSH 100 UNIT/ML IV SOLN
500.0000 [IU] | Freq: Once | INTRAVENOUS | Status: AC
  Administered 2022-03-28: 500 [IU]

## 2022-03-28 NOTE — Telephone Encounter (Signed)
Patient states she is not great. She was only able to speak with an NP at the urgent care on 03/23/22, but she has psychiatry appointment on 04/11/22. She doesn't have anything set up with psychology.

## 2022-03-29 NOTE — Telephone Encounter (Signed)
Called and talked to patient about recent events.  At home.  Has psych f/u pending.  Please call and see if her appointment can get moved sooner.  Thanks.

## 2022-03-30 NOTE — Telephone Encounter (Signed)
Cathy [name], [place] [facility]

## 2022-03-30 NOTE — Telephone Encounter (Addendum)
[place]uly note, please let patient know and make sure she is on the cancellation list, in case she can be moved up.  Thanks.

## 2022-03-30 NOTE — Telephone Encounter (Signed)
Called and spoke with [facility] at [place]. There are no openings with anyone before the 30th and they can not move her appt up. The scheduler looked at several different providers schedules.

## 2022-03-30 NOTE — Telephone Encounter (Signed)
Called and notified patient that appt can not be moved up right now. Advised that I asked for them to call if there are any cancellations. Patient was thankful for [place] trying to get [place] seen sooner.

## 2022-04-05 ENCOUNTER — Telehealth: Payer: Self-pay | Admitting: Family Medicine

## 2022-04-05 NOTE — Telephone Encounter (Signed)
Left message for patient to call back and schedule Medicare Annual Wellness Visit (AWV) either virtually or phone   Last AWV  08/10/20    45 min for awv-i and in office appointments 30 min for awv-s  phone/virtual appointments

## 2022-04-08 ENCOUNTER — Ambulatory Visit: Payer: Medicare PPO | Admitting: Family Medicine

## 2022-04-08 ENCOUNTER — Encounter: Payer: Self-pay | Admitting: Family Medicine

## 2022-04-08 VITALS — BP 102/60 | HR 71 | Temp 97.2°F | Ht 65.0 in | Wt 111.0 lb

## 2022-04-08 DIAGNOSIS — F32A Depression, unspecified: Secondary | ICD-10-CM | POA: Diagnosis not present

## 2022-04-08 DIAGNOSIS — F419 Anxiety disorder, unspecified: Secondary | ICD-10-CM | POA: Diagnosis not present

## 2022-04-08 DIAGNOSIS — Z23 Encounter for immunization: Secondary | ICD-10-CM

## 2022-04-08 MED ORDER — VENLAFAXINE HCL ER 75 MG PO CP24: 75.0000 mg | ORAL_CAPSULE | Freq: Every day | ORAL | 0 refills | Status: DC

## 2022-04-08 NOTE — Patient Instructions (Signed)
Take 1 dose of buspar today.  The stop lexapro and buspar.   Change to effexor and keep the appointment on Monday.  We'll call about seeing psychiatry.   Take care.  Glad to see you.

## 2022-04-08 NOTE — Progress Notes (Unsigned)
Guns are out of the home at baseline.  Prev eval d/w pt.    She has psychology f/u pending.    Hard to get out of bed, fatigued, not cleaning the house.  She would usually be getting ready for Halloween and her grandson's birthday but isn't motivated.  No active SI/HI.  Lower mood and anxious.  She didn't think the lexapro/buspar clearly helped much. Still on lexapro and buspar.    She wasn't on trintellex prev, hadn't changed meds.    Weight down from 120 to 111 lbs today.    Meds, vitals, and allergies reviewed.   ROS: Per HPI unless specifically indicated in ROS section   GEN: nad, alert and oriented, episodically tearful but regains composure HEENT: ncat CV: rrr.  PULM: ctab, no inc wob ABD: soft, +bs EXT: no edema SKIN: no acute rash No tremor.  Speech fluent.  Judgment intact.  30 minutes were devoted to patient care in this encounter (this includes time spent reviewing the patient's file/history, interviewing and examining the patient, counseling/reviewing plan with patient).

## 2022-04-10 NOTE — Assessment & Plan Note (Signed)
Discussed options.  She has already taken 1 dose of Lexapro today.  Take 1 dose of buspar today.  Then stop lexapro and buspar.   Change to effexor and keep the appointment on Monday with psychology. Refer to psychiatry.  Routine cautions given to patient.  My hope is that Effexor will provide relief sooner than Trintellix would have otherwise.  Discussed.  At this point still okay for outpatient follow-up.  She has family support.

## 2022-04-11 ENCOUNTER — Ambulatory Visit (INDEPENDENT_AMBULATORY_CARE_PROVIDER_SITE_OTHER): Payer: Medicare PPO | Admitting: Psychologist

## 2022-04-11 DIAGNOSIS — F411 Generalized anxiety disorder: Secondary | ICD-10-CM | POA: Diagnosis not present

## 2022-04-11 DIAGNOSIS — F321 Major depressive disorder, single episode, moderate: Secondary | ICD-10-CM | POA: Diagnosis not present

## 2022-04-11 NOTE — Progress Notes (Signed)
[place] Counselor Initial Adult Exam  Name: Cathy [name] Date: 04/11/2022 MRN: [id number] DOB: 06-18-1945 PCP: Tonia Ghent, MD  Time spent: 01:07 pm to 01:47 pm; total time: 40 minutes  This session was held via in person. The patient consented to in-person therapy and was in the clinician's office. Limits of confidentiality were discussed with the patient.   Guardian/Payee:  NA    Paperwork requested: No   Reason for Visit /Presenting Problem: Anxiety and depression  Mental Status Exam: Appearance:   Well Groomed     Behavior:  Appropriate  Motor:  Normal  Speech/Language:   Clear and Coherent  Affect:  Appropriate  Mood:  normal  Thought process:  normal  Thought content:    WNL  Sensory/Perceptual disturbances:    WNL  Orientation:  oriented to person, place, and time/date  Attention:  Good  Concentration:  Good  Memory:  WNL  Fund of knowledge:   Good  Insight:    Fair  Judgment:   Good  Impulse Control:  Good   Reported Symptoms:  The patient endorsed experiencing the following: feeling down, sad, social isolation, avoiding pleasurable activities, rumination of negative thoughts, fatigue, loss of motivation, difficulty with hygiene, and thoughts of hopelessness. She denied suicidal and homicidal ideation.  The patient endorsed experiencing the following: shortness of breath, heart palpitations, racing thoughts, feeling on edge, feeling restless, and difficulty controlling worries. She denied suicidal and homicidal ideation.   Risk Assessment: Danger to Self:  No Self-injurious Behavior: No Danger to Others: No Duty to Warn:no Physical Aggression / Violence:No  Access to Firearms a concern: No  Gang Involvement:No  Patient / guardian was educated about steps to take if suicide or homicide risk level increases between visits: n/a While future psychiatric events cannot be accurately predicted, the patient does not currently require acute  inpatient psychiatric care and does not currently meet [facility] involuntary commitment criteria.  Substance Abuse History: Current substance abuse: No     Past Psychiatric History:   Previous psychological history is significant for Grief Outpatient Providers:NA History of Psych Hospitalization: No  Psychological Testing:  NA    Abuse History:  Victim of: No.,  NA    Report needed: No. Victim of Neglect:No. Perpetrator of  NA   Witness / Exposure to Domestic Violence: No   Protective Services Involvement: No  Witness to [employer] Violence:  No   Family History:  Family History  Problem Relation Age of Onset   Hypertension Mother    Stroke Mother    Heart attack Mother        CABG, 5 Stents   Dementia Mother    Cancer Father        Kidney   Asthma Sister    Diabetes Brother        Borderline   Alcohol abuse Brother    Cancer Maternal Aunt        stomach ca   Cancer Maternal Aunt        ovarian ca   Breast cancer Cousin    Colon cancer Neg Hx     Living situation: the patient lives alone  Sexual Orientation: Straight  Relationship Status: widowed  Name of spouse / other:NA If a parent, number of children / ages:Patient has one son and two grandchildren  Support Systems: Family  Financial Stress:  No   Income/Employment/Disability: Actor: No   Educational History: Education: Scientist, product/process development:  Christian  Any cultural differences that may affect / interfere with treatment:  not applicable   Recreation/Hobbies: Being with family  Stressors: Other: a family event that occurred within the last y ear    Strengths: Supportive Relationships  Barriers:  NA   Legal History: Pending legal issue / charges: The patient has no significant history of legal issues. 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    Grade 2   Vision changes     Past Surgical History:  Procedure Laterality Date   CHOLECYSTECTOMY     IR IMAGING GUIDED PORT INSERTION  09/21/2020   LITHOTRIPSY  1984   LYMPH NODE BIOPSY     MASS BIOPSY Left 09/03/2020   Procedure: LEFT NECK JUGULAR NODE;  Surgeon: Rozetta Nunnery, MD;  Location: [place];  Service: ENT;  Laterality: Left;   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   Procedure: LEFT TOTAL KNEE ARTHROPLASTY;  Surgeon: Gaynelle Arabian, MD;  Location: [facility];  Service: Orthopedics;  Laterality: Left;    Medications: Current Outpatient Medications  Medication Sig Dispense Refill   Acetaminophen (TYLENOL) 325 MG CAPS Take by mouth.     albuterol (VENTOLIN HFA) 108 (90 Base) MCG/ACT inhaler Inhale 2 puffs into the lungs every 6 (six) hours as needed for wheezing or shortness of breath. 8 g 2   aspirin EC 81 MG tablet Take 81 mg by mouth daily.     atorvastatin (LIPITOR) 20 MG tablet Take 1 tablet (20 mg total) by mouth daily. 90 tablet 1   cetirizine (ZYRTEC) 10 MG tablet Take 1 tablet (10 mg total) by mouth daily. 90 tablet 1   Cholecalciferol (VITAMIN D3) 25 MCG (1000 UT) CAPS Take 1 capsule (1,000 Units total) by mouth daily.     cyanocobalamin (VITAMIN B12) 1000 MCG tablet Take 1 tablet (1,000 mcg total) by mouth daily.     denosumab (PROLIA) 60 MG/ML SOSY injection Inject 60 mg into the skin every 6 (six) months.     estradiol (ESTRACE) 0.1 MG/GM  vaginal cream Discard plastic applicator. Insert blueberry size amount of cream on finger in vagina daily x1 week then 2x per week.     fluticasone furoate-vilanterol (BREO ELLIPTA) 100-25 MCG/ACT AEPB Inhale 1 puff into the lungs daily. 60 each 5   glucose blood (ACCU-CHEK AVIVA PLUS) test strip Use to Monitor blood sugars daily. DX. E11.9 100 each 5   hydrochlorothiazide (MICROZIDE) 12.5 MG capsule Take 1 capsule (12.5 mg total) by mouth daily. 90 capsule 1   hydrOXYzine (ATARAX) 10 MG tablet Take 1 tablet (10 mg total) by mouth every 8 (eight) hours as needed for anxiety. 90 tablet 5   lidocaine-prilocaine (EMLA) cream Apply to affected area once 30 g 3   losartan (COZAAR) 100 MG tablet TAKE 1 TABLET BY MOUTH ONCE A  DAY 90 tablet 1   LUTEIN PO Take by mouth.     metoprolol succinate (TOPROL-XL) 50 MG 24 hr tablet Take 1 tablet (50 mg total) by mouth daily. Take with or immediately following a meal. 90 tablet 1   Multiple Vitamin (MULTIVITAMIN) capsule Take 1 capsule by mouth daily.     omeprazole (PRILOSEC) 20 MG capsule Take 1 capsule (20 mg total) by mouth daily. 90 capsule 1   Turmeric 450 MG CAPS Take 450 mg by mouth daily.     venlafaxine XR (EFFEXOR XR) 75 MG 24 hr capsule Take 1 capsule (75 mg total) by mouth daily with breakfast. 90 capsule 0   No current facility-administered medications for this visit.    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 The patient lives alone. 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 She denied suicidal and homicidal ideation. The patient meets criteria for a diagnosis of F41.1 generalized anxiety disorder based off of the following:  shortness of breath, heart palpitations, racing thoughts, feeling on edge, feeling restless, and difficulty controlling worries. She denied suicidal and homicidal ideation. The patient's sister who was present disclosed of a situation that occurred within the family a year ago. Upon this information, patient's affect changed. Patient's sister indicated that it was shortly after that patient started experiencing anxiety and depression. It is possible that the event that occurred is a contributing factor to the patient's presentation.   This psychologist makes the recommendation that the patient participate in therapy bi-weekly to assist her.    Conception Chancy, PsyD

## 2022-04-11 NOTE — Progress Notes (Signed)
                Marvel Mcphillips, PsyD 

## 2022-04-18 ENCOUNTER — Telehealth: Payer: Self-pay | Admitting: Family Medicine

## 2022-04-18 NOTE — Telephone Encounter (Signed)
LVM for pt to rtn my call to schedule AWV with [facility] call back # [phone] 

## 2022-04-19 IMAGING — PT NM PET TUM IMG RESTAG (PS) SKULL BASE T - THIGH
1 series · 6 of 6 positions shown · non-contrast
Comparison: PET-CT 11/23/2020

CLINICAL DATA: Subsequent treatment strategy for nodular sclerosing
Hodgkin's disease.

EXAM:
NUCLEAR MEDICINE PET SKULL BASE TO THIGH
TECHNIQUE: 7.0 mCi F-18 FDG was injected intravenously. Full-ring PET imaging
was performed from the skull base to thigh after the radiotracer. CT
data was obtained and used for attenuation correction and anatomic
localization.
Fasting blood glucose: 131 mg/dl

[Series 8093: results mm oncology reading · 5.0mm · 0.93mm/px · 6 of 6 slices shown]
[im 1/6]
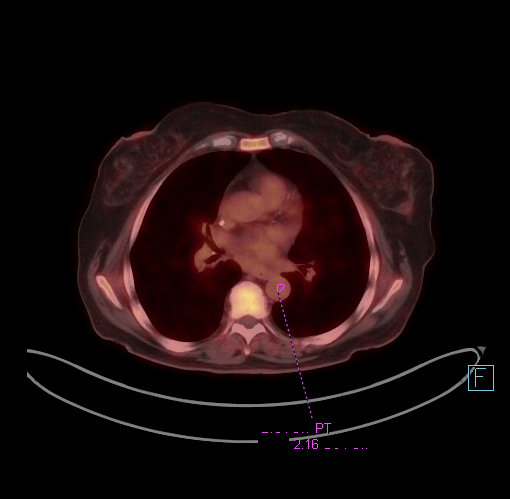
[im 2/6]
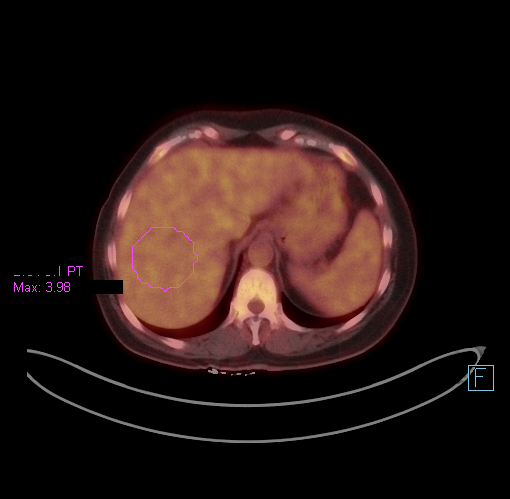
[im 3/6]
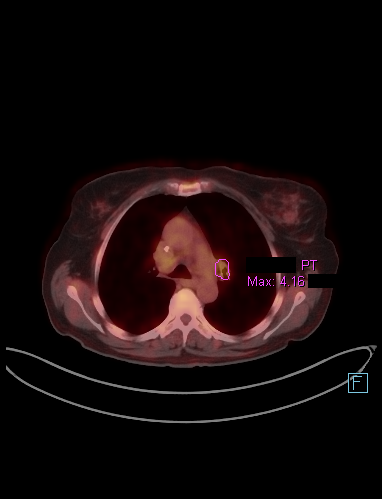
[im 4/6]
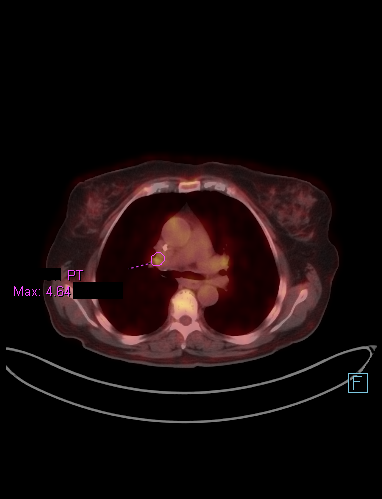
[im 5/6]
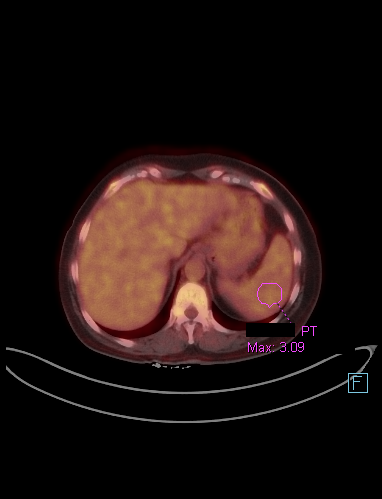
[im 6/6]
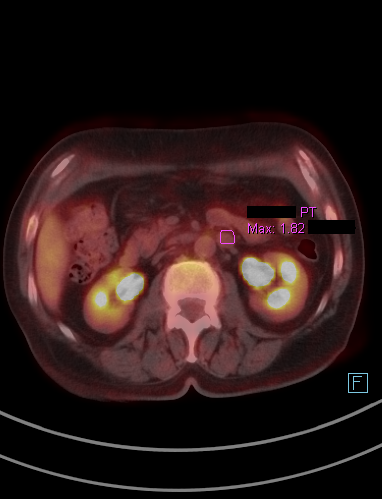

[6 of 6 positions shown; findings below may reference images not displayed]

FINDINGS: Mediastinal blood pool activity: SUV max

Liver activity: SUV max

NECK:

Interval resolution of previously demonstrated hypermetabolic and
mildly enlarged cervical lymph nodes.There are no lesions of the
pharyngeal mucosal space.

Incidental CT findings: Bilateral carotid atherosclerosis.

CHEST:

There are small residual hypermetabolic hilar lymph nodes
bilaterally (SUV max of 4.2 on the left and 4.6 on the right). There
are no residual enlarged mediastinal or axillary lymph nodes. There
are small residual left axillary and distal esophageal lymph nodes
which demonstrate no abnormal metabolic activity. No hypermetabolic
pulmonary activity or suspicious pulmonary nodularity.

Incidental CT findings: Mild emphysema and biapical scarring. Right
IJ Port-A-Cath extends to the superior cavoatrial junction.
Atherosclerosis of the aorta, great vessels and coronary arteries
with probable aortic valvular calcifications.

ABDOMEN/PELVIS:

There is no hypermetabolic activity within the liver, adrenal
glands, spleen or pancreas. Interval near complete resolution of
previously demonstrated extensive hypermetabolic adenopathy
involving the gastrohepatic ligament and upper retroperitoneum. The
largest remaining lymph node is a left periaortic node measuring
cm on image 113/4. This has an SUV max of 1.8. Previously, this
measured 3.1 cm and had an SUV max of 10.2.

Incidental CT findings: Prior cholecystectomy. Stable 1.4 cm
hyperdense lesion in the upper pole of the left kidney without
hypermetabolic activity. There is a nonobstructing small right renal
calculus. Aortic and branch vessel atherosclerosis, distal colonic
diverticulosis and a 4.4 cm cystic appearing lesion along the left
pelvic sidewall are unchanged. This cystic pelvic lesion is not
associated with any abnormal metabolic activity.

SKELETON:

Diffuse marrow activity attributed to treatment response. There is
no focal hypermetabolic activity to suggest residual marrow
involvement.

Incidental CT findings: Chronic superior endplate compression
deformity at L1. Chronic bilateral L4 pars defects with associated
grade 2 anterolisthesis at L4-5.
IMPRESSION: 1. Interval significant response to therapy. The previously
demonstrated hypermetabolic adenopathy in the neck, chest and
abdomen has nearly completely resolved. [HOSPITAL] 1 and 2. Slightly
greater residual metabolic activity within hilar lymph nodes is less
specific and may be reactive.
2. Resolution of previously demonstrated focal hypermetabolic
activity in the spleen and bones. Diffuse bone marrow activity
attributed to treatment response.
3. Stable incidental findings as detailed above.

## 2022-05-12 ENCOUNTER — Telehealth: Payer: Self-pay

## 2022-05-12 ENCOUNTER — Encounter: Payer: Self-pay | Admitting: Hematology and Oncology

## 2022-05-12 ENCOUNTER — Other Ambulatory Visit (HOSPITAL_COMMUNITY): Payer: Self-pay

## 2022-05-12 NOTE — Telephone Encounter (Signed)
Pharmacy Patient Advocate Encounter  Insurance verification completed.    The patient is insured through [place] test claims for: Prolia '60mg'$ .  Pharmacy benefit copay: $50.00

## 2022-05-12 NOTE — Telephone Encounter (Signed)
Pharmacy Patient Advocate Encounter   Received notification from [facility] that prior authorization for Prolia '60MG'$ /ML syringes is required/requested.  PA submitted on 05/12/2022 to [employer] via CoverMyMeds Key [id number]  Status is pending

## 2022-05-13 NOTE — Telephone Encounter (Signed)
Pt ready for scheduling on or after 05/13/2022  Out-of-pocket cost due at time of visit: $35  Primary: [employer] Prolia co-insurance: 0%  Admin fee co-insurance: $35  Secondary:  Prolia co-insurance:  Admin fee co-insurance:   Deductible:   Prior Auth:  PA#  [id number]  Valid: 07/11/2019-06/13/2023  ** This summary of benefits is an estimation of the patient's out-of-pocket cost. Exact cost may vary based on individual plan coverage.

## 2022-05-13 NOTE — Telephone Encounter (Signed)
Patient Advocate Encounter  Prior Authorization for Prolia '60MG'$ /ML syringes has been approved.    PA# [id number] Key: [id number]  Effective dates: 07/11/2019 through 06/13/2023  Approval letter attached to charts

## 2022-05-16 ENCOUNTER — Other Ambulatory Visit: Payer: Self-pay | Admitting: Nurse Practitioner

## 2022-05-16 DIAGNOSIS — E7800 Pure hypercholesterolemia, unspecified: Secondary | ICD-10-CM

## 2022-05-17 ENCOUNTER — Other Ambulatory Visit (HOSPITAL_COMMUNITY): Payer: Self-pay

## 2022-05-17 ENCOUNTER — Telehealth: Payer: Self-pay

## 2022-05-17 NOTE — Telephone Encounter (Signed)
Pharmacy Patient Advocate Encounter  Prolia due 05/20/22  Insurance verification completed.    The patient is insured through [place] test claims for: Prolia.  Pharmacy benefit copay: $50.00

## 2022-05-22 ENCOUNTER — Telehealth: Payer: Self-pay | Admitting: Family Medicine

## 2022-05-22 NOTE — Telephone Encounter (Signed)
Please get update on patient regarding her mood.  Thanks.

## 2022-05-23 NOTE — Telephone Encounter (Signed)
Spoke with patient and she states she is still not better on the increased dose of effexor. States she feels worse.

## 2022-05-24 ENCOUNTER — Inpatient Hospital Stay: Payer: Medicare PPO | Attending: Hematology and Oncology

## 2022-05-24 ENCOUNTER — Encounter: Payer: Self-pay | Admitting: Hematology and Oncology

## 2022-05-24 ENCOUNTER — Inpatient Hospital Stay: Payer: Medicare PPO | Admitting: Hematology and Oncology

## 2022-05-24 VITALS — BP 142/75 | HR 83 | Temp 97.4°F | Resp 14 | Ht 65.0 in | Wt 109.4 lb

## 2022-05-24 DIAGNOSIS — Z7951 Long term (current) use of inhaled steroids: Secondary | ICD-10-CM | POA: Insufficient documentation

## 2022-05-24 DIAGNOSIS — I3139 Other pericardial effusion (noninflammatory): Secondary | ICD-10-CM | POA: Diagnosis not present

## 2022-05-24 DIAGNOSIS — F324 Major depressive disorder, single episode, in partial remission: Secondary | ICD-10-CM | POA: Diagnosis not present

## 2022-05-24 DIAGNOSIS — C8118 Nodular sclerosis classical Hodgkin lymphoma, lymph nodes of multiple sites: Secondary | ICD-10-CM | POA: Diagnosis not present

## 2022-05-24 DIAGNOSIS — I700 Atherosclerosis of aorta: Secondary | ICD-10-CM | POA: Diagnosis not present

## 2022-05-24 DIAGNOSIS — Z79899 Other long term (current) drug therapy: Secondary | ICD-10-CM | POA: Diagnosis not present

## 2022-05-24 DIAGNOSIS — Z7982 Long term (current) use of aspirin: Secondary | ICD-10-CM | POA: Diagnosis not present

## 2022-05-24 LAB — CMP (CANCER CENTER ONLY)
ALT: 8 U/L (ref 0–44)
AST: 15 U/L (ref 15–41)
Albumin: 4.4 g/dL (ref 3.5–5.0)
Alkaline Phosphatase: 36 U/L — ABNORMAL LOW (ref 38–126)
Anion gap: 6 (ref 5–15)
BUN: 13 mg/dL (ref 8–23)
CO2: 28 mmol/L (ref 22–32)
Calcium: 9.7 mg/dL (ref 8.9–10.3)
Chloride: 108 mmol/L (ref 98–111)
Creatinine: 0.6 mg/dL (ref 0.44–1.00)
GFR, Estimated: >60 mL/min (ref >60)
Glucose, Bld: 99 mg/dL (ref 70–99)
Potassium: 4 mmol/L (ref 3.5–5.1)
Sodium: 142 mmol/L (ref 135–145)
Total Bilirubin: 0.4 mg/dL (ref 0.3–1.2)
Total Protein: 6.4 g/dL — ABNORMAL LOW (ref 6.5–8.1)

## 2022-05-24 LAB — CBC WITH DIFFERENTIAL (CANCER CENTER ONLY)
Abs Immature Granulocytes: 0.02 10*3/uL (ref 0.00–0.07)
Basophils Absolute: 0 10*3/uL (ref 0.0–0.1)
Basophils Relative: 0 %
Eosinophils Absolute: 0 10*3/uL (ref 0.0–0.5)
Eosinophils Relative: 1 %
HCT: 45.4 % (ref 36.0–46.0)
Hemoglobin: 15.2 g/dL — ABNORMAL HIGH (ref 12.0–15.0)
Immature Granulocytes: 0 %
Lymphocytes Relative: 22 %
Lymphs Abs: 1.3 10*3/uL (ref 0.7–4.0)
MCH: 29.9 pg (ref 26.0–34.0)
MCHC: 33.5 g/dL (ref 30.0–36.0)
MCV: 89.4 fL (ref 80.0–100.0)
Monocytes Absolute: 0.5 10*3/uL (ref 0.1–1.0)
Monocytes Relative: 8 %
Neutro Abs: 4 10*3/uL (ref 1.7–7.7)
Neutrophils Relative %: 69 %
Platelet Count: 243 10*3/uL (ref 150–400)
RBC: 5.08 MIL/uL (ref 3.87–5.11)
RDW: 12.8 % (ref 11.5–15.5)
WBC Count: 5.8 10*3/uL (ref 4.0–10.5)
nRBC: 0 % (ref 0.0–0.2)

## 2022-05-24 MED ORDER — VENLAFAXINE HCL ER 75 MG PO CP24: 150.0000 mg | ORAL_CAPSULE | Freq: Every day | ORAL | Status: DC

## 2022-05-24 MED ORDER — HEPARIN SOD (PORK) LOCK FLUSH 100 UNIT/ML IV SOLN
500.0000 [IU] | Freq: Once | INTRAVENOUS | Status: AC
  Administered 2022-05-24: 500 [IU]

## 2022-05-24 MED ORDER — SODIUM CHLORIDE 0.9% FLUSH
10.0000 mL | Freq: Once | INTRAVENOUS | Status: AC
  Administered 2022-05-24: 10 mL

## 2022-05-24 NOTE — Telephone Encounter (Signed)
I checked with Dr. Modesta Messing.  Greatly appreciate her help.    Please check with patient, verify effexor dose.  If taking '75mg'$ , then would increase to '150mg'$  a day in the meantime.  Would do that prior to psych appointment.  Let me know if not tolerated or if she needs a new rx.    Thanks.

## 2022-05-24 NOTE — Assessment & Plan Note (Signed)
Her recent weight loss is due to major depression She is started on antidepressants with regular follow-up The patient believe this is the cause of her weight loss and not due to cancer recurrence I would defer to her primary care physician for further management

## 2022-05-24 NOTE — Telephone Encounter (Signed)
Patient called no lab appointment needed. Were checked at cancer center today and I range. Appointment made with patient.

## 2022-05-24 NOTE — Telephone Encounter (Signed)
Called psych clinic, LMOVM requesting call back about patient.

## 2022-05-24 NOTE — Assessment & Plan Note (Signed)
Her last CT imaging showed no signs of cancer recurrence We discussed the risk and benefits of screening CT imaging and the timing of it Clinically, she have no signs of recurrence The only negative symptoms she has right now is recent weight loss which the patient believes is unrelated We discussed follow-up and port maintenance I plan to see her again in 3 months We will get her next imaging study in July 2024

## 2022-05-24 NOTE — Progress Notes (Signed)
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 [facility]) Her last CT imaging showed no signs of cancer recurrence We discussed the risk and benefits of screening CT imaging and the timing of it Clinically, she have no signs of recurrence The only negative symptoms she has right now is recent weight loss which the patient believes is unrelated We discussed follow-up and port maintenance I plan to see her again in 3 months We will get her next imaging study in July 2024   Major depression in partial remission ([place]) Her recent weight loss is due to major depression She is started on antidepressants with regular follow-up The patient believe this is the cause of her weight loss and not due to cancer recurrence I would defer to her primary care physician for further management  Orders Placed This Encounter  Procedures   CBC with Differential/Platelet    Standing Status:   Standing    Number of Occurrences:   22    Standing Expiration Date:   05/25/2023   Comprehensive metabolic panel    Standing Status:   Standing    Number of Occurrences:   33    Standing Expiration Date:   05/25/2023    All questions were answered. The patient knows to call the clinic with any problems, questions or concerns. The total time spent in the appointment was 25 minutes encounter with patients including review of chart and various tests results, discussions about plan of care and coordination of care plan   Heath Lark, MD 05/24/2022 10:15 AM  INTERVAL HISTORY: Please see below for problem oriented charting. she returns for surveillance follow-up for history of Hodgkin lymphoma She have lost some weight The patient stated she has suffered  from major depression and is getting treatment for this She denies abnormal night sweats, new lymphadenopathy or recent infection She has mild persistent neuropathy but it does not bother her  REVIEW OF SYSTEMS:   Constitutional: Denies fevers, chills Eyes: Denies blurriness of vision Ears, nose, mouth, throat, and face: Denies mucositis or sore throat Respiratory: Denies cough, dyspnea or wheezes Cardiovascular: Denies palpitation, chest discomfort or lower extremity swelling Gastrointestinal:  Denies nausea, heartburn or change in bowel habits Skin: Denies abnormal skin rashes Lymphatics: Denies new lymphadenopathy or easy bruising All other systems were reviewed with the patient and are negative.  I have reviewed the past medical history, past surgical history, social history and family history with the patient and they are unchanged from previous note.  ALLERGIES:  is allergic to codeine.  MEDICATIONS:  Current Outpatient Medications  Medication Sig Dispense Refill   Acetaminophen (TYLENOL) 325 MG CAPS Take by mouth.     albuterol (VENTOLIN HFA) 108 (90 Base) MCG/ACT inhaler Inhale 2 puffs into the lungs every 6 (six) hours as needed for wheezing or shortness of breath. 8 g 2   aspirin EC 81 MG tablet Take 81 mg by mouth daily.     atorvastatin (LIPITOR) 20 MG tablet Take 1 tablet (20 mg total) by mouth daily. 90 tablet 1   cetirizine (ZYRTEC) 10 MG tablet Take 1 tablet (10 mg total) by mouth daily. 90 tablet 1   Cholecalciferol (VITAMIN D3) 25 MCG (1000 UT) CAPS Take 1 capsule (1,000 Units  total) by mouth daily.     cyanocobalamin (VITAMIN B12) 1000 MCG tablet Take 1 tablet (1,000 mcg total) by mouth daily.     denosumab (PROLIA) 60 MG/ML SOSY injection Inject 60 mg into the skin every 6 (six) months.     estradiol (ESTRACE) 0.1 MG/GM vaginal cream Discard plastic applicator. Insert blueberry size amount of cream on finger in vagina daily x1 week then 2x per week.     fluticasone  furoate-vilanterol (BREO ELLIPTA) 100-25 MCG/ACT AEPB Inhale 1 puff into the lungs daily. 60 each 5   glucose blood (ACCU-CHEK AVIVA PLUS) test strip Use to Monitor blood sugars daily. DX. E11.9 100 each 5   hydrochlorothiazide (MICROZIDE) 12.5 MG capsule Take 1 capsule (12.5 mg total) by mouth daily. 90 capsule 1   hydrOXYzine (ATARAX) 10 MG tablet Take 1 tablet (10 mg total) by mouth every 8 (eight) hours as needed for anxiety. 90 tablet 5   lidocaine-prilocaine (EMLA) cream Apply to affected area once 30 g 3   losartan (COZAAR) 100 MG tablet TAKE 1 TABLET BY MOUTH ONCE A DAY 90 tablet 1   LUTEIN PO Take by mouth.     metoprolol succinate (TOPROL-XL) 50 MG 24 hr tablet Take 1 tablet (50 mg total) by mouth daily. Take with or immediately following a meal. 90 tablet 1   Multiple Vitamin (MULTIVITAMIN) capsule Take 1 capsule by mouth daily.     omeprazole (PRILOSEC) 20 MG capsule Take 1 capsule (20 mg total) by mouth daily. 90 capsule 1   Turmeric 450 MG CAPS Take 450 mg by mouth daily.     venlafaxine XR (EFFEXOR XR) 75 MG 24 hr capsule Take 1 capsule (75 mg total) by mouth daily with breakfast. 90 capsule 0   No current facility-administered medications for this visit.    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 Extensive hypermetabolic lymphadenopathy involving the neck, chest and abdomen as detailed above (Deauville 5). I do not see any pelvic disease. 2. Possible involvement of the spleen. 3. Osseous  involvement is also noted.     09/03/2020 Pathology Results   A. LYMPH NODE, LEFT NECK, EXCISION:  -Lymphoproliferative disorder consistent with classical Hodgkin lymphoma -See comment   COMMENT:   The sections show effacement of the lymph nodal architecture by a primarily nodular lymphoproliferative process.  In the more cellular areas, the nodules are composed of a mixture of small lymphocytes, plasma cells, abundant histiocytes in addition to atypical large mononuclear and multi-lobated lymphoid appearing cells with variably prominent nucleoli.  The nodules are surrounded by dense collagenous fibrosis.  In the less cellular areas, there appears to be a fibroblastic proliferation and a much more depleted cellular appearance but scattering of large atypical lymphoid appearing cells is still seen.   Flow cytometric analysis was performed [facility] [phone]) and shows predominance of T lymphocytes with nonspecific changes including relative abundance of CD8 positive cells and reversal of the CD4: CD8 ratio.  No monoclonal B-cell population identified.  A battery of immunohistochemical stains was performed and shows that  the large atypical lymphoid appearing cells are positive for CD30, Mum-1, PAX 5 (weak), CD23 in addition to variable but generally weak positivity for CD79a and CD20.  Only scattered large atypical lymphoid cells show weak Golgi positivity for CD15.  The large atypical lymphoid cells are negative for LCA, CD10, CD3, CD5, EMA, ALK protein and EBV in situ  hybridization.  The small lymphoid cells in the background show a mixture of T and B cells with predominance of T cells.  T cells show a mixture of CD4 and CD8 positive cells with slight predominance of the latter.  The overall findings are most consistent with classical Hodgkin lymphoma, nodular sclerosis subtype.      09/21/2020 Procedure   Placement of a subcutaneous port device. Catheter tip at the superior cavoatrial junction.    09/25/2020 Echocardiogram   1. Left ventricular ejection fraction, by estimation, is 60 to 65%. The left ventricle has normal function. The left ventricle has no regional wall motion abnormalities. There is mild concentric left ventricular hypertrophy. Left ventricular diastolic parameters are consistent with Grade I diastolic dysfunction (impaired relaxation). The average left ventricular global longitudinal strain is -19.6 %. The global longitudinal strain is normal.  2. Right ventricular systolic function is normal. The right ventricular size is normal.  3. A small pericardial effusion is present. The pericardial effusion is surrounding the apex and anterior to the right ventricle. There is no evidence of cardiac tamponade.  4. The mitral valve is normal in structure. No evidence of mitral valve regurgitation.  5. The aortic valve is tricuspid. Aortic valve regurgitation is not visualized. Mild aortic valve sclerosis is present, with no evidence of aortic valve stenosis.  6. The inferior vena cava is normal in size with greater than 50% respiratory variability, suggesting right atrial pressure of 3 mmHg.    Chemotherapy    Patient is on Treatment Plan: HODGKINS LYMPHOMA  A + AVD Q28D       11/23/2020 PET scan   1. Interval significant response to therapy. The previously demonstrated hypermetabolic adenopathy in the neck, chest and abdomen has nearly completely resolved. Deauville 1 and 2. Slightly greater residual metabolic activity within hilar lymph nodes is less specific and may be reactive. 2. Resolution of previously demonstrated focal hypermetabolic activity in the spleen and bones. Diffuse bone marrow activity attributed to treatment response. 3. Stable incidental findings as detailed above.   01/11/2021 Echocardiogram    1. Stable EF and GLS compared to 09/25/20. Left ventricular ejection fraction, by estimation, is 60 to 65%. The left ventricle has normal function. The left ventricle has  no regional wall motion abnormalities. Left ventricular diastolic parameters are consistent with Grade I diastolic dysfunction (impaired relaxation). The average left ventricular global longitudinal strain is -19.7 %. The global longitudinal strain is normal.  2. Right ventricular systolic function is normal. The right ventricular size is normal.  3. Left atrial size was mildly dilated.  4. The mitral valve is abnormal. Trivial mitral valve regurgitation. No evidence of mitral stenosis. Moderate mitral annular calcification.  5. The aortic valve is calcified. There is moderate calcification of the aortic valve. Aortic valve regurgitation is not visualized. Mild to moderate aortic valve sclerosis/calcification is present, without any evidence of aortic stenosis.  6. The inferior vena cava is normal in size with greater than 50% respiratory variability, suggesting right atrial pressure of 3 mmHg.     04/01/2021 PET scan   Resolution of bilateral hilar activity and decreased size of LEFT  retroperitoneal lymph node with stable SUV value less than mediastinal blood pool.   Interval development of pulmonary nodules with increased metabolic activity. Findings are nonspecific. Nodule in the RIGHT upper lobe has a branching pattern that could be seen in the setting of infectious or inflammatory changes. This is considered due to relatively rapid development. There is however moderate, relatively higher uptake in the LEFT lower lobe raising the question of neoplasm including lymphoma.   Three-vessel coronary artery disease.   Aortic Atherosclerosis (ICD10-I70.0).     07/01/2021 Imaging   1. Resolution of mediastinal and hilar lymphadenopathy. Minimal residual matted soft tissue density consistent with treated disease. No findings suspicious for residual or recurrent lymphoma. 2. Small branching nodular lesion in the right upper lobe and subpleural nodularity at the left lung base are stable. Likely benign  process. Attention on future scans is suggested. 3. No acute pulmonary findings.   Aortic Atherosclerosis (ICD10-I70.0).     01/03/2022 Imaging   Stable 8 mm left para-aortic lymph node. No new or progressive disease.   Stable subpleural nodularity in the posterior left lower lobe, most likely benign. Recommend continued attention on follow-up imaging.   Stable 4.8 cm benign-appearing left adnexal cyst. Recommend continued attention on follow-up imaging.   Aortic Atherosclerosis (ICD10-I70.0).     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[place])   09/29/2020 - 03/02/2021 Chemotherapy   Patient is on Treatment Plan : HODGKINS LYMPHOMA  A + AVD q28d       PHYSICAL EXAMINATION: ECOG PERFORMANCE STATUS: 1 - Symptomatic but completely ambulatory  Vitals:   05/24/22 0915  BP: (!) 142/75  Pulse: 83  Resp: 14  Temp: (!) 97.4 F (36.3 C)  SpO2: 100%   Filed Weights   05/24/22 0915  Weight: 109 lb 6.4 oz (49.6 kg)    GENERAL:alert, no distress and comfortable SKIN: skin color, texture, turgor are normal, no rashes or significant lesions EYES: normal, Conjunctiva are pink and non-injected, sclera clear OROPHARYNX:no exudate, no erythema and lips, buccal mucosa, and tongue normal  NECK: supple, thyroid normal size, non-tender, without nodularity LYMPH:  no palpable lymphadenopathy in the cervical, axillary or inguinal LUNGS: clear to auscultation and percussion with normal breathing effort HEART: regular rate & rhythm and no murmurs and no lower extremity edema ABDOMEN:abdomen soft, non-tender and normal bowel sounds Musculoskeletal:no cyanosis of digits and no clubbing  NEURO: alert & oriented x 3 with fluent speech, no focal motor/sensory deficits  LABORATORY DATA:  I have reviewed the data as listed    Component Value Date/Time   NA 142 05/24/2022 0858   NA 143 08/15/2016 1037   K 4.0 05/24/2022 0858   K  4.8 08/15/2016 1037   CL 108 05/24/2022 0858   CL 103 07/16/2012 1304   CO2 28 05/24/2022 0858   CO2 27 08/15/2016 1037   GLUCOSE 99 05/24/2022 0858   GLUCOSE 111 08/15/2016 1037   GLUCOSE 112 (H) 07/16/2012 1304   BUN 13 05/24/2022 0858   BUN 12.7 08/15/2016 1037   CREATININE 0.60 05/24/2022 0858   CREATININE 0.95 (H) 08/10/2020 1041   CREATININE 0.9 08/15/2016 1037   CALCIUM 9.7 05/24/2022 0858   CALCIUM 10.3 08/13/2020 1117   CALCIUM 10.5 (H) 08/15/2016 1037   PROT 6.4 (L) 05/24/2022 0858   PROT 7.9 08/15/2016 1037   ALBUMIN 4.4 05/24/2022 0858   ALBUMIN 4.5 08/15/2016 1037   AST 15 05/24/2022 0858   AST 30  08/15/2016 1037   ALT 8 05/24/2022 0858   ALT 29 08/15/2016 1037   ALKPHOS 36 (L) 05/24/2022 0858   ALKPHOS 67 08/15/2016 1037   BILITOT 0.4 05/24/2022 0858   BILITOT 0.80 08/15/2016 1037   GFRNONAA >60 05/24/2022 0858   GFRNONAA 62 12/05/2017 1000   GFRAA 72 12/05/2017 1000    No results found for: "SPEP", "UPEP"  Lab Results  Component Value Date   WBC 5.8 05/24/2022   NEUTROABS 4.0 05/24/2022   HGB 15.2 (H) 05/24/2022   HCT 45.4 05/24/2022   MCV 89.4 05/24/2022   PLT 243 05/24/2022      Chemistry      Component Value Date/Time   NA 142 05/24/2022 0858   NA 143 08/15/2016 1037   K 4.0 05/24/2022 0858   K 4.8 08/15/2016 1037   CL 108 05/24/2022 0858   CL 103 07/16/2012 1304   CO2 28 05/24/2022 0858   CO2 27 08/15/2016 1037   BUN 13 05/24/2022 0858   BUN 12.7 08/15/2016 1037   CREATININE 0.60 05/24/2022 0858   CREATININE 0.95 (H) 08/10/2020 1041   CREATININE 0.9 08/15/2016 1037      Component Value Date/Time   CALCIUM 9.7 05/24/2022 0858   CALCIUM 10.3 08/13/2020 1117   CALCIUM 10.5 (H) 08/15/2016 1037   ALKPHOS 36 (L) 05/24/2022 0858   ALKPHOS 67 08/15/2016 1037   AST 15 05/24/2022 0858   AST 30 08/15/2016 1037   ALT 8 05/24/2022 0858   ALT 29 08/15/2016 1037   BILITOT 0.4 05/24/2022 0858   BILITOT 0.80 08/15/2016 1037

## 2022-05-24 NOTE — Addendum Note (Signed)
Addended by: Tonia Ghent on: 05/24/2022 02:39 PM   Modules accepted: Orders

## 2022-05-25 NOTE — Telephone Encounter (Signed)
LMTCB

## 2022-05-25 NOTE — Telephone Encounter (Signed)
Patient called in returning a call she received.  

## 2022-05-26 NOTE — Telephone Encounter (Signed)
Patient is taking 75 mg QD of effexor. Advised patient to take 150 mg and let us know if that does not help. Patient verbalized understanding.

## 2022-05-26 NOTE — Telephone Encounter (Signed)
Left message to return call to our office.  

## 2022-05-31 NOTE — Telephone Encounter (Signed)
Labs checked 05/31/2022 63.23 mL/min is your estimated creatinine clearance Calcium 9.7   Ok to have Prolia as scheduled

## 2022-06-01 ENCOUNTER — Ambulatory Visit (INDEPENDENT_AMBULATORY_CARE_PROVIDER_SITE_OTHER): Payer: Medicare PPO

## 2022-06-01 DIAGNOSIS — M810 Age-related osteoporosis without current pathological fracture: Secondary | ICD-10-CM | POA: Diagnosis not present

## 2022-06-01 MED ORDER — DENOSUMAB 60 MG/ML ~~LOC~~ SOSY
60.0000 mg | PREFILLED_SYRINGE | Freq: Once | SUBCUTANEOUS | Status: AC
  Administered 2022-06-01: 60 mg via SUBCUTANEOUS

## 2022-06-01 NOTE — Progress Notes (Signed)
Per orders of Dr. Damita Dunnings, injection of Prolia given by Pilar Grammes, CMA in Left Arm. Patient tolerated injection well.  Sending to Dr Lorelei Pont in Dr Josefine Class absence.

## 2022-06-02 NOTE — Progress Notes (Deleted)
Psychiatric Initial Adult Assessment   Patient Identification: Cathy [name] MRN:  [id number] Date of Evaluation:  06/02/2022 Referral Source: *** Chief Complaint:  No chief complaint on file.  Visit Diagnosis: No diagnosis found.  History of Present Illness:   Cathy [name] is a 76 y.o. year old female with a history of depression, hodgkin lymphoma (diagnosed in 2022, s/p chemotherapy), hypertension, hyperlipidemia, peripheral neuropathy, asthma, who presents for follow up appointment for below.    Venlafaxine 75 mg daily, Hydroxyzine 10 mg q8hprn for anxiety       Associated Signs/Symptoms: Depression Symptoms:  {DEPRESSION SYMPTOMS:20000} (Hypo) Manic Symptoms:  {BHH MANIC SYMPTOMS:22872} Anxiety Symptoms:  {BHH ANXIETY SYMPTOMS:22873} Psychotic Symptoms:  {BHH PSYCHOTIC SYMPTOMS:22874} PTSD Symptoms: {BHH PTSD SYMPTOMS:22875}  Past Psychiatric History:  Outpatient:  Psychiatry admission:  Previous suicide attempt:  Past trials of medication:  History of violence:  History of head injury:   Previous Psychotropic Medications: {YES/NO:21197}  Substance Abuse History in the last 12 months:  {yes no:314532}  Consequences of Substance Abuse: {BHH CONSEQUENCES OF SUBSTANCE ABUSE:22880}  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   Vision changes     Past Surgical History:  Procedure Laterality Date   CHOLECYSTECTOMY     IR  IMAGING GUIDED PORT INSERTION  09/21/2020   LITHOTRIPSY  1984   LYMPH NODE BIOPSY     MASS BIOPSY Left 09/03/2020   Procedure: LEFT NECK JUGULAR NODE;  Surgeon: Rozetta Nunnery, MD;  Location: [facility];  Service: ENT;  Laterality: Left;   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   Procedure: LEFT TOTAL KNEE ARTHROPLASTY;  Surgeon: Gaynelle Arabian, MD;  Location: [facility];  Service: Orthopedics;  Laterality: Left;    Family Psychiatric History: ***  Family History:  Family History  Problem Relation Age of Onset   Hypertension Mother    Stroke Mother    Heart attack Mother        CABG, 5 Stents   Dementia Mother    Cancer Father        Kidney   Asthma Sister    Diabetes Brother        Borderline   Alcohol abuse Brother    Cancer Maternal Aunt        stomach ca   Cancer Maternal Aunt        ovarian ca   Breast cancer Cousin    Colon cancer Neg Hx     Social History:   Social History   Socioeconomic History   Marital status: Widowed    Spouse name: Not on file   Number of children: Not on file   Years of education: Not on file   Highest education level: Not on file  Occupational History   Not on file  Tobacco Use   Smoking status: Never   Smokeless tobacco: Never  Substance and Sexual Activity   Alcohol use: No   Drug use: No   Sexual activity: Not Currently    Birth control/protection: Post-menopausal  Other Topics Concern   Not on file  Social History Narrative   Lives alone.     Son is [name].     Social Determinants of Health   Financial Resource Strain: Not on file  Food Insecurity: Not on file  Transportation Needs: Not on file  Physical Activity: Not on file  Stress: Not on file  Social Connections: Not on file    Additional Social History: ***  Allergies:   Allergies  Allergen Reactions   Codeine     Patients states when she was young, she was told she "acted weird" after being given codeine    Metabolic  Disorder Labs: Lab Results  Component Value Date   HGBA1C 5.3 04/05/2021   MPG 105 04/05/2021   MPG 126 12/04/2020   No results found for: "PROLACTIN" Lab Results  Component Value Date   CHOL 132 04/05/2021   TRIG 153 (H) 04/05/2021   HDL 41 (L) 04/05/2021   CHOLHDL 3.2 04/05/2021   VLDL 34 (H) 12/06/2016   LDLCALC 67 04/05/2021   LDLCALC 85 08/10/2020   Lab Results  Component Value Date   TSH 1.02 02/17/2022    Therapeutic Level Labs: No results found for: "LITHIUM" No results found for: "CBMZ" No results found for: "VALPROATE"  Current Medications: Current Outpatient Medications  Medication Sig Dispense Refill   Acetaminophen (TYLENOL) 325 MG CAPS Take by mouth.     albuterol (VENTOLIN HFA) 108 (90 Base) MCG/ACT inhaler Inhale 2 puffs into the lungs every 6 (six) hours as needed for wheezing or shortness of breath. 8 g 2   aspirin EC 81 MG tablet Take 81 mg by mouth daily.     atorvastatin (LIPITOR) 20 MG tablet Take 1 tablet (20 mg total) by mouth daily. 90 tablet 1   cetirizine (ZYRTEC) 10 MG tablet Take 1 tablet (10 mg total) by mouth daily. 90 tablet 1   Cholecalciferol (VITAMIN D3) 25 MCG (1000 UT) CAPS Take 1 capsule (1,000 Units total) by mouth daily.     cyanocobalamin (VITAMIN B12) 1000 MCG tablet Take 1 tablet (1,000 mcg total) by mouth daily.     denosumab (PROLIA) 60 MG/ML SOSY injection Inject 60 mg into the skin every 6 (six) months.     estradiol (ESTRACE) 0.1 MG/GM vaginal cream Discard plastic applicator. Insert blueberry size amount of cream on finger in vagina daily x1 week then 2x per week.     fluticasone furoate-vilanterol (BREO ELLIPTA) 100-25 MCG/ACT AEPB Inhale 1 puff into the lungs daily. 60 each 5   glucose blood (ACCU-CHEK AVIVA PLUS) test strip Use to Monitor blood sugars daily. DX. E11.9 100 each 5   hydrochlorothiazide (MICROZIDE) 12.5 MG capsule Take 1 capsule (12.5 mg total) by mouth daily. 90 capsule 1   hydrOXYzine (ATARAX) 10 MG tablet  Take 1 tablet (10 mg total) by mouth every 8 (eight) hours as needed for anxiety. 90 tablet 5   lidocaine-prilocaine (EMLA) cream Apply to affected area once 30 g 3   losartan (COZAAR) 100 MG tablet TAKE 1 TABLET BY MOUTH ONCE A DAY 90 tablet 1   LUTEIN PO Take by mouth.     metoprolol succinate (TOPROL-XL) 50 MG 24 hr tablet Take 1 tablet (50 mg total) by mouth daily. Take with or immediately following a meal. 90 tablet 1  Multiple Vitamin (MULTIVITAMIN) capsule Take 1 capsule by mouth daily.     omeprazole (PRILOSEC) 20 MG capsule Take 1 capsule (20 mg total) by mouth daily. 90 capsule 1   Turmeric 450 MG CAPS Take 450 mg by mouth daily.     venlafaxine XR (EFFEXOR XR) 75 MG 24 hr capsule Take 2 capsules (150 mg total) by mouth daily with breakfast.     No current facility-administered medications for this visit.    Musculoskeletal: Strength & Muscle Tone: within normal limits Gait & Station: normal Patient leans: N/A  Psychiatric Specialty Exam: Review of Systems  There were no vitals taken for this visit.There is no height or weight on file to calculate BMI.  General Appearance: {Appearance:22683}  Eye Contact:  {BHH EYE CONTACT:22684}  Speech:  Clear and Coherent  Volume:  Normal  Mood:  {BHH MOOD:22306}  Affect:  {Affect (PAA):22687}  Thought Process:  Coherent  Orientation:  Full (Time, Place, and Person)  Thought Content:  Logical  Suicidal Thoughts:  {ST/HT (PAA):22692}  Homicidal Thoughts:  {ST/HT (PAA):22692}  Memory:  Immediate;   Good  Judgement:  {Judgement (PAA):22694}  Insight:  {Insight (PAA):22695}  Psychomotor Activity:  Normal  Concentration:  Concentration: Good and Attention Span: Good  Recall:  Good  Fund of Knowledge:Good  Language: Good  Akathisia:  No  Handed:  Right  AIMS (if indicated):  not done  Assets:  Communication Skills Desire for Improvement  ADL's:  Intact  Cognition: WNL  Sleep:  {BHH GOOD/FAIR/POOR:22877}   Screenings: [employer] [facility] Office Visit from 08/10/2020 in [place] Office Visit from 01/13/2020 in [place] Office Visit from 07/16/2019 in [place] Office Visit from 04/08/2019 in [place] Office Visit from 12/03/2018 in [facility]  PHQ-2 Total Score 0 0 0 0 0      Flowsheet Row Admission (Discharged) from 09/03/2020 in [facility] No Risk       Assessment and Plan:  Assessment  Plan   The patient demonstrates the following risk factors for suicide: Chronic risk factors for suicide include: {Chronic Risk Factors for HENIDPO:24235361}. Acute risk factors for suicide include: {Acute Risk Factors for WERXVQM:08676195}. Protective factors for this patient include: {Protective Factors for Suicide KDTO:67124580}. Considering these factors, the overall suicide risk at this point appears to be {Desc; low/moderate/high:110033}. Patient {ACTION; IS/IS DXI:33825053} appropriate for outpatient follow up.   Collaboration of Care: {BH OP Collaboration of Care:21014065}  Patient/Guardian was advised Release of Information must be obtained prior to any record release in order to collaborate their care with an outside provider. Patient/Guardian was advised if they have not already done so to contact the registration department to sign all necessary forms in order for [place] to release information regarding their care.   Consent: Patient/Guardian gives verbal consent for treatment and assignment of benefits for services provided during this visit. Patient/Guardian expressed understanding and agreed to proceed.   Norman Clay, MD 12/21/20231:54 PM

## 2022-06-10 ENCOUNTER — Ambulatory Visit (HOSPITAL_COMMUNITY)
Admission: EM | Admit: 2022-06-10 | Discharge: 2022-06-14 | Disposition: A | Discharge status: Other Acute Inpatient Hospital | Payer: Medicare PPO | Attending: Urology | Admitting: Urology

## 2022-06-10 DIAGNOSIS — F332 Major depressive disorder, recurrent severe without psychotic features: Secondary | ICD-10-CM | POA: Diagnosis not present

## 2022-06-10 DIAGNOSIS — R7303 Prediabetes: Secondary | ICD-10-CM | POA: Diagnosis not present

## 2022-06-10 DIAGNOSIS — Z1152 Encounter for screening for COVID-19: Secondary | ICD-10-CM | POA: Insufficient documentation

## 2022-06-10 DIAGNOSIS — Z20822 Contact with and (suspected) exposure to covid-19: Secondary | ICD-10-CM | POA: Diagnosis not present

## 2022-06-10 LAB — COMPREHENSIVE METABOLIC PANEL
ALT: 15 U/L (ref 0–44)
AST: 20 U/L (ref 15–41)
Albumin: 4.6 g/dL (ref 3.5–5.0)
Alkaline Phosphatase: 43 U/L (ref 38–126)
Anion gap: 10 (ref 5–15)
BUN: 9 mg/dL (ref 8–23)
CO2: 25 mmol/L (ref 22–32)
Calcium: 9.2 mg/dL (ref 8.9–10.3)
Chloride: 105 mmol/L (ref 98–111)
Creatinine, Ser: 0.7 mg/dL (ref 0.44–1.00)
GFR, Estimated: >60 mL/min (ref >60)
Glucose, Bld: 114 mg/dL — ABNORMAL HIGH (ref 70–99)
Potassium: 4.2 mmol/L (ref 3.5–5.1)
Sodium: 140 mmol/L (ref 135–145)
Total Bilirubin: 0.9 mg/dL (ref 0.3–1.2)
Total Protein: 6.8 g/dL (ref 6.5–8.1)

## 2022-06-10 LAB — POCT URINE DRUG SCREEN - MANUAL ENTRY (I-SCREEN)
POC Amphetamine UR: NOT DETECTED
POC Buprenorphine (BUP): NOT DETECTED
POC Cocaine UR: NOT DETECTED
POC Marijuana UR: NOT DETECTED
POC Methadone UR: NOT DETECTED
POC Methamphetamine UR: NOT DETECTED
POC Morphine: NOT DETECTED
POC Oxazepam (BZO): NOT DETECTED
POC Oxycodone UR: NOT DETECTED
POC Secobarbital (BAR): NOT DETECTED

## 2022-06-10 LAB — LIPID PANEL
Cholesterol: 246 mg/dL — ABNORMAL HIGH (ref 0–200)
HDL: 57 mg/dL (ref >40)
LDL Cholesterol: 170 mg/dL — ABNORMAL HIGH (ref 0–99)
Total CHOL/HDL Ratio: 4.3 RATIO
Triglycerides: 94 mg/dL (ref <150)
VLDL: 19 mg/dL (ref 0–40)

## 2022-06-10 LAB — CBC WITH DIFFERENTIAL/PLATELET
Abs Immature Granulocytes: 0.03 10*3/uL (ref 0.00–0.07)
Basophils Absolute: 0 10*3/uL (ref 0.0–0.1)
Basophils Relative: 1 %
Eosinophils Absolute: 0.1 10*3/uL (ref 0.0–0.5)
Eosinophils Relative: 1 %
HCT: 48.3 % — ABNORMAL HIGH (ref 36.0–46.0)
Hemoglobin: 16.3 g/dL — ABNORMAL HIGH (ref 12.0–15.0)
Immature Granulocytes: 0 %
Lymphocytes Relative: 20 %
Lymphs Abs: 1.7 10*3/uL (ref 0.7–4.0)
MCH: 30 pg (ref 26.0–34.0)
MCHC: 33.7 g/dL (ref 30.0–36.0)
MCV: 88.8 fL (ref 80.0–100.0)
Monocytes Absolute: 0.7 10*3/uL (ref 0.1–1.0)
Monocytes Relative: 9 %
Neutro Abs: 6 10*3/uL (ref 1.7–7.7)
Neutrophils Relative %: 69 %
Platelets: 248 10*3/uL (ref 150–400)
RBC: 5.44 MIL/uL — ABNORMAL HIGH (ref 3.87–5.11)
RDW: 13.2 % (ref 11.5–15.5)
WBC: 8.6 10*3/uL (ref 4.0–10.5)
nRBC: 0 % (ref 0.0–0.2)

## 2022-06-10 LAB — RESP PANEL BY RT-PCR (RSV, FLU A&B, COVID)  RVPGX2
Influenza A by PCR: NEGATIVE
Influenza B by PCR: NEGATIVE
Resp Syncytial Virus by PCR: NEGATIVE
SARS Coronavirus 2 by RT PCR: NEGATIVE

## 2022-06-10 LAB — HEMOGLOBIN A1C
Hgb A1c MFr Bld: 5.5 % (ref 4.8–5.6)
Mean Plasma Glucose: 111.15 mg/dL

## 2022-06-10 LAB — ETHANOL: Alcohol, Ethyl (B): <10 mg/dL (ref <10)

## 2022-06-10 LAB — TSH: TSH: 1.797 u[IU]/mL (ref 0.350–4.500)

## 2022-06-10 MED ORDER — TRAZODONE HCL 50 MG PO TABS
50.0000 mg | ORAL_TABLET | Freq: Every evening | ORAL | Status: DC | PRN
  Administered 2022-06-12: 50 mg via ORAL
  Filled 2022-06-10 (×2): qty 1

## 2022-06-10 MED ORDER — ATORVASTATIN CALCIUM 10 MG PO TABS
20.0000 mg | ORAL_TABLET | Freq: Every day | ORAL | Status: DC
  Administered 2022-06-11 – 2022-06-14 (×4): 20 mg via ORAL
  Filled 2022-06-10 (×4): qty 2

## 2022-06-10 MED ORDER — ACETAMINOPHEN 325 MG PO TABS: 650.0000 mg | ORAL_TABLET | Freq: Four times a day (QID) | ORAL | Status: DC | PRN

## 2022-06-10 MED ORDER — HYDROXYZINE HCL 10 MG PO TABS
10.0000 mg | ORAL_TABLET | Freq: Three times a day (TID) | ORAL | Status: DC | PRN
  Administered 2022-06-11: 10 mg via ORAL
  Filled 2022-06-10 (×3): qty 1

## 2022-06-10 MED ORDER — HYDROCHLOROTHIAZIDE 12.5 MG PO TABS
12.5000 mg | ORAL_TABLET | Freq: Every day | ORAL | Status: DC
  Administered 2022-06-11 – 2022-06-14 (×4): 12.5 mg via ORAL
  Filled 2022-06-10 (×4): qty 1

## 2022-06-10 MED ORDER — ADULT MULTIVITAMIN W/MINERALS CH
1.0000 | ORAL_TABLET | Freq: Every day | ORAL | Status: DC
  Administered 2022-06-11 – 2022-06-14 (×4): 1 via ORAL
  Filled 2022-06-10 (×4): qty 1

## 2022-06-10 MED ORDER — ALUM & MAG HYDROXIDE-SIMETH 200-200-20 MG/5ML PO SUSP: 30.0000 mL | ORAL | Status: DC | PRN

## 2022-06-10 MED ORDER — LOSARTAN POTASSIUM 50 MG PO TABS
100.0000 mg | ORAL_TABLET | Freq: Every day | ORAL | Status: DC
  Administered 2022-06-11 – 2022-06-14 (×4): 100 mg via ORAL
  Filled 2022-06-10 (×4): qty 2

## 2022-06-10 MED ORDER — VITAMIN D3 25 MCG (1000 UNIT) PO TABS
1000.0000 [IU] | ORAL_TABLET | Freq: Every day | ORAL | Status: DC
  Administered 2022-06-11 – 2022-06-12 (×2): 1000 [IU] via ORAL
  Filled 2022-06-10 (×7): qty 1

## 2022-06-10 MED ORDER — VITAMIN B-12 1000 MCG PO TABS
1000.0000 ug | ORAL_TABLET | Freq: Every day | ORAL | Status: DC
  Administered 2022-06-11 – 2022-06-14 (×4): 1000 ug via ORAL
  Filled 2022-06-10 (×4): qty 1

## 2022-06-10 MED ORDER — PANTOPRAZOLE SODIUM 40 MG PO TBEC
40.0000 mg | DELAYED_RELEASE_TABLET | Freq: Every day | ORAL | Status: DC
  Administered 2022-06-11 – 2022-06-14 (×4): 40 mg via ORAL
  Filled 2022-06-10 (×4): qty 1

## 2022-06-10 MED ORDER — MAGNESIUM HYDROXIDE 400 MG/5ML PO SUSP: 30.0000 mL | Freq: Every day | ORAL | Status: DC | PRN

## 2022-06-10 MED ORDER — ASPIRIN 81 MG PO TBEC
81.0000 mg | DELAYED_RELEASE_TABLET | Freq: Every day | ORAL | Status: DC
  Administered 2022-06-11 – 2022-06-14 (×4): 81 mg via ORAL
  Filled 2022-06-10 (×4): qty 1

## 2022-06-10 MED ORDER — VENLAFAXINE HCL ER 150 MG PO CP24
150.0000 mg | ORAL_CAPSULE | Freq: Every day | ORAL | Status: DC
  Administered 2022-06-11 – 2022-06-14 (×4): 150 mg via ORAL
  Filled 2022-06-10 (×5): qty 1

## 2022-06-10 MED ORDER — METOPROLOL SUCCINATE ER 50 MG PO TB24
50.0000 mg | ORAL_TABLET | Freq: Every day | ORAL | Status: DC
  Administered 2022-06-11 – 2022-06-14 (×4): 50 mg via ORAL
  Filled 2022-06-10 (×4): qty 1

## 2022-06-10 NOTE — ED Provider Notes (Signed)
[facility] Continuous Assessment Admission H&P  Date: 06/10/22 Patient Name: Cathy [name] MRN: [id number] Chief Complaint: No chief complaint on file.     Diagnoses:  Final diagnoses:  Severe episode of recurrent major depressive disorder, without psychotic features ([place])    HPI: Cathy [name] is a 76 year old female with psychiatric history of depression and anxiety.  Patient presented voluntarily to [facility] reporting worsening depressive symptoms and suicidal ideation.  Patient is accompanied by her son, Cathy [name] who participated in assessment with permission from patient.  Patient was seen face-to-face and her chart was reviewed by this nurse practitioner.  On assessment, patient is sitting quietly with her son.  She is alert and oriented x 4; she is calm and cooperative.  He is speaking in a normal tone of voice at moderate rate with good eye contact.  Patient's mood is anxious and depressed; affect is congruent with her mood.  Thought process is coherent.  There were no signs that patient is responding to any internal/external stimuli or experiencing any delusional thought content during assessment.  Patient reports that she was diagnosed with depression and anxiety in March 2023.  She says she was initially prescribed Lexapro 10 mg/day. She report she took Lexapro for approximately 3 months but it was not effective and was switched to Effexor 75 mg/day. She report she stopped taking Effexor in Nov 2023 due to feeling dizzy and no relief of depressive symptoms. She says she has been experiencing increased depressive symptoms and panic attacks over that past several months. She reports having 2-3 panic attacks per day. She is unable to identify any triggers. She admits to approaching her neighbor to obtain a firearm today to kill herself. She says she had intended to kill herself but "I don't think I know how to use a gun." She reports she was in "mental pain and wanted to end  it." She is endorsing active suicidal ideation. Patient said to this writer "if you can just give me a pill and I can just go to sleep to end all this misery. I just want to go to sleep and not wake up" Patient states "I wish I was dead." She denies history of suicidal attempts or self-harm.  She denies homicidal ideation.  She denies hallucination, paranoia, and substance use.  Discussed inpatient psychiatric admission (geriatric psych) for stabilization and safety with patient and her son.  Both patient and son were amenable to plan.   PHQ 2-9:  [place] Office Visit from 06/12/2017 in [place] Office Visit from 12/06/2016 in [place] Office Visit from 06/03/2016 in [place]  Thoughts that you would be better off dead, or of hurting yourself in some way Not at all Not at all Not at all  PHQ-9 Total Score 3 2 0       Flowsheet Row ED from 06/10/2022 in [facility] Admission (Discharged) from 09/03/2020 in [place] Moderate Risk No Risk        Total Time spent with patient: 20 minutes  Musculoskeletal  Strength & Muscle Tone: within normal limits Gait & Station: normal Patient leans: Right  Psychiatric Specialty Exam  Presentation General Appearance:  Appropriate for Environment  Eye Contact: Good  Speech: Clear and Coherent  Speech Volume: Normal  Handedness: Right   Mood and Affect  Mood: Anxious; Depressed  Affect: Depressed   Thought Process  Thought Processes: Coherent  Descriptions of Associations:Intact  Orientation:Full (Time, Place and Person)  Thought Content:Logical  Diagnosis of Schizophrenia or Schizoaffective disorder in past: No   Hallucinations:Hallucinations: None  Ideas of Reference:None  Suicidal Thoughts:Suicidal Thoughts: Yes, Active SI Active Intent and/or Plan: With Plan  Homicidal Thoughts:Homicidal Thoughts:  No   Sensorium  Memory: Immediate Good; Recent Good; Remote Good  Judgment: Good  Insight: Good   Executive Functions  Concentration: Good  Attention Span: Good  Recall: Good  Fund of Knowledge: Good  Language: Good   Psychomotor Activity  Psychomotor Activity: Psychomotor Activity: Normal   Assets  Assets: Communication Skills; Desire for Improvement; Housing; Physical Health; Social Support; Resilience; Transportation   Sleep  Sleep: Sleep: Good Number of Hours of Sleep: 10   Nutritional Assessment (For OBS and FBC admissions only) Has the patient had a weight loss or gain of 10 pounds or more in the last 3 months?: Yes Has the patient had a decrease in food intake/or appetite?: Yes Does the patient have dental problems?: No Does the patient have eating habits or behaviors that may be indicators of an eating disorder including binging or inducing vomiting?: No Has the patient recently lost weight without trying?: 3 Has the patient been eating poorly because of a decreased appetite?: 1 Malnutrition Screening Tool Score: 4 Nutritional Assessment Referrals: Refer to Primary Care Provider    Physical Exam Vitals and nursing note reviewed.  Constitutional:      General: She is not in acute distress.    Appearance: She is well-developed. She is not ill-appearing.  HENT:     Head: Normocephalic and atraumatic.  Eyes:     Conjunctiva/sclera: Conjunctivae normal.  Cardiovascular:     Rate and Rhythm: Normal rate.     Heart sounds: No murmur heard. Pulmonary:     Effort: Pulmonary effort is normal. No respiratory distress.  Abdominal:     Palpations: Abdomen is soft.     Tenderness: There is no abdominal tenderness.  Musculoskeletal:        General: No swelling.     Cervical back: Normal range of motion.  Skin:    General: Skin is warm and dry.     Capillary Refill: Capillary refill takes less than 2 seconds.  Neurological:     Mental Status:  She is alert and oriented to person, place, and time.  Psychiatric:        Attention and Perception: Attention and perception normal. She is attentive. She does not perceive auditory or visual hallucinations.        Mood and Affect: Mood is anxious and depressed.        Speech: Speech normal.        Behavior: Behavior normal. Behavior is cooperative.        Thought Content: Thought content is not paranoid or delusional. Thought content includes suicidal ideation. Thought content does not include homicidal ideation. Thought content includes suicidal plan. Thought content does not include homicidal plan.        Cognition and Memory: Cognition normal.    Review of Systems  Constitutional: Negative.   HENT: Negative.    Eyes: Negative.   Respiratory: Negative.    Cardiovascular: Negative.   Gastrointestinal: Negative.   Genitourinary: Negative.   Musculoskeletal: Negative.   Skin: Negative.   Neurological: Negative.   Endo/Heme/Allergies: Negative.   Psychiatric/Behavioral:  Positive for depression and suicidal ideas. Negative for hallucinations, memory loss and substance abuse. The patient is nervous/anxious. The patient does not have insomnia.  Blood pressure (!) 145/89, pulse 85, temperature 97.8 F (36.6 C), temperature source Oral, resp. rate 18, SpO2 100 %. There is no height or weight on file to calculate BMI.  Past Psychiatric History:    Is the patient at risk to self? Yes  Has the patient been a risk to self in the past 6 months? No .    Has the patient been a risk to self within the distant past? No   Is the patient a risk to others? No   Has the patient been a risk to others in the past 6 months? No   Has the patient been a risk to others within the distant past? No   Past Medical History:  Past Medical History:  Diagnosis Date   Abnormal glucose    Allergy    Asthma    Allergy induced   Benign breast cyst in female    GERD (gastroesophageal reflux disease)     Hilar lymphadenopathy 08/01/2011   History of nuclear stress test 2009   Treadmill and Stress Myoview- no CAD   Hyperlipidemia    Hypertension    Lymphadenopathy of left cervical region 08/01/2011   Nephrolithiasis 1984   Osteopenia    PONV (postoperative nausea and vomiting)    Post-menopause    Pre-diabetes    borderline   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    Grade 2   Vision changes     Past Surgical History:  Procedure Laterality Date   CHOLECYSTECTOMY     IR IMAGING GUIDED PORT INSERTION  09/21/2020   LITHOTRIPSY  1984   LYMPH NODE BIOPSY     MASS BIOPSY Left 09/03/2020   Procedure: LEFT NECK JUGULAR NODE;  Surgeon: Rozetta Nunnery, MD;  Location: [place];  Service: ENT;  Laterality: Left;   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   Procedure: LEFT TOTAL KNEE ARTHROPLASTY;  Surgeon: Gaynelle Arabian, MD;  Location: [facility];  Service: Orthopedics;  Laterality: Left;    Family History:  Family History  Problem Relation Age of Onset   Hypertension Mother    Stroke Mother    Heart attack Mother        CABG, 5 Stents   Dementia Mother    Cancer Father        Kidney   Asthma Sister    Diabetes Brother        Borderline   Alcohol abuse Brother    Cancer Maternal Aunt        stomach ca   Cancer Maternal Aunt        ovarian ca   Breast cancer Cousin    Colon cancer Neg Hx     Social History:  Social History   Socioeconomic History   Marital status: Widowed    Spouse name: Not on file   Number of children: Not on file   Years of education: Not on file   Highest education level: Not on file  Occupational History   Not on file  Tobacco Use   Smoking status: Never   Smokeless tobacco: Never  Substance and Sexual Activity   Alcohol use: No   Drug use: No   Sexual activity: Not Currently    Birth control/protection: Post-menopausal  Other Topics Concern   Not on file  Social History Narrative   Lives alone.     Son is [name].      Social Determinants of Health   Financial Resource Strain: Not on file  Food Insecurity: Not on file  Transportation Needs: Not on file  Physical Activity: Not on file  Stress: Not on file  Social Connections: Not on file  Intimate Partner Violence: Not on file    SDOH:  SDOH Screenings   Alcohol Screen: Low Risk  (08/10/2020)  Depression (PHQ2-9): Low Risk  (08/10/2020)  Tobacco Use: Low Risk  (05/24/2022)    Last Labs:  Admission on 06/10/2022  Component Date Value Ref Range Status   WBC 06/10/2022 8.6  4.0 - 10.5 K/uL Final   RBC 06/10/2022 5.44 (H)  3.87 - 5.11 MIL/uL Final   Hemoglobin 06/10/2022 16.3 (H)  12.0 - 15.0 g/dL Final   HCT 06/10/2022 48.3 (H)  36.0 - 46.0 % Final   MCV 06/10/2022 88.8  80.0 - 100.0 fL Final   MCH 06/10/2022 30.0  26.0 - 34.0 pg Final   MCHC 06/10/2022 33.7  30.0 - 36.0 g/dL Final   RDW 06/10/2022 13.2  11.5 - 15.5 % Final   Platelets 06/10/2022 248  150 - 400 K/uL Final   nRBC 06/10/2022 0.0  0.0 - 0.2 % Final   Neutrophils Relative % 06/10/2022 69  % Final   Neutro Abs 06/10/2022 6.0  1.7 - 7.7 K/uL Final   Lymphocytes Relative 06/10/2022 20  % Final   Lymphs Abs 06/10/2022 1.7  0.7 - 4.0 K/uL Final   Monocytes Relative 06/10/2022 9  % Final   Monocytes Absolute 06/10/2022 0.7  0.1 - 1.0 K/uL Final   Eosinophils Relative 06/10/2022 1  % Final   Eosinophils Absolute 06/10/2022 0.1  0.0 - 0.5 K/uL Final   Basophils Relative 06/10/2022 1  % Final   Basophils Absolute 06/10/2022 0.0  0.0 - 0.1 K/uL Final   Immature Granulocytes 06/10/2022 0  % Final   Abs Immature Granulocytes 06/10/2022 0.03  0.00 - 0.07 K/uL Final   Performed at [facility], [place] [address] [place], [place] [address]   Hgb A1c MFr Bld 06/10/2022 5.5  4.8 - 5.6 % Final   Comment: (NOTE) Pre diabetes:          5.7%-6.4%  Diabetes:              >6.4%  Glycemic control for   <7.0% adults with diabetes    Mean Plasma Glucose 06/10/2022 111.15  mg/dL Final    Performed at [facility], [place] [address], [place], [place] [address]   POC Amphetamine UR 06/10/2022 None Detected  NONE DETECTED (Cut Off Level 1000 ng/mL) Preliminary   POC Secobarbital (BAR) 06/10/2022 None Detected  NONE DETECTED (Cut Off Level 300 ng/mL) Preliminary   POC Buprenorphine (BUP) 06/10/2022 None Detected  NONE DETECTED (Cut Off Level 10 ng/mL) Preliminary   POC Oxazepam (BZO) 06/10/2022 None Detected  NONE DETECTED (Cut Off Level 300 ng/mL) Preliminary   POC Cocaine UR 06/10/2022 None Detected  NONE DETECTED (Cut Off Level 300 ng/mL) Preliminary   POC Methamphetamine UR 06/10/2022 None Detected  NONE DETECTED (Cut Off Level 1000 ng/mL) Preliminary   POC Morphine 06/10/2022 None Detected  NONE DETECTED (Cut Off Level 300 ng/mL) Preliminary   POC Methadone UR 06/10/2022 None Detected  NONE DETECTED (Cut Off Level 300 ng/mL) Preliminary   POC Oxycodone UR 06/10/2022 None Detected  NONE DETECTED (Cut Off Level 100 ng/mL) Preliminary   POC Marijuana UR 06/10/2022 None Detected  NONE DETECTED (Cut Off Level 50 ng/mL) Preliminary  Infusion on 05/24/2022  Component Date Value Ref Range Status  WBC Count 05/24/2022 5.8  4.0 - 10.5 K/uL Final   RBC 05/24/2022 5.08  3.87 - 5.11 MIL/uL Final   Hemoglobin 05/24/2022 15.2 (H)  12.0 - 15.0 g/dL Final   HCT 05/24/2022 45.4  36.0 - 46.0 % Final   MCV 05/24/2022 89.4  80.0 - 100.0 fL Final   MCH 05/24/2022 29.9  26.0 - 34.0 pg Final   MCHC 05/24/2022 33.5  30.0 - 36.0 g/dL Final   RDW 05/24/2022 12.8  11.5 - 15.5 % Final   Platelet Count 05/24/2022 243  150 - 400 K/uL Final   nRBC 05/24/2022 0.0  0.0 - 0.2 % Final   Neutrophils Relative % 05/24/2022 69  % Final   Neutro Abs 05/24/2022 4.0  1.7 - 7.7 K/uL Final   Lymphocytes Relative 05/24/2022 22  % Final   Lymphs Abs 05/24/2022 1.3  0.7 - 4.0 K/uL Final   Monocytes Relative 05/24/2022 8  % Final   Monocytes Absolute 05/24/2022 0.5  0.1 - 1.0 K/uL Final   Eosinophils Relative  05/24/2022 1  % Final   Eosinophils Absolute 05/24/2022 0.0  0.0 - 0.5 K/uL Final   Basophils Relative 05/24/2022 0  % Final   Basophils Absolute 05/24/2022 0.0  0.0 - 0.1 K/uL Final   Immature Granulocytes 05/24/2022 0  % Final   Abs Immature Granulocytes 05/24/2022 0.02  0.00 - 0.07 K/uL Final   Performed at [facility], [place] [address], [place], [place] [address]   Sodium 05/24/2022 142  135 - 145 mmol/L Final   Potassium 05/24/2022 4.0  3.5 - 5.1 mmol/L Final   Chloride 05/24/2022 108  98 - 111 mmol/L Final   CO2 05/24/2022 28  22 - 32 mmol/L Final   Glucose, Bld 05/24/2022 99  70 - 99 mg/dL Final   Glucose reference range applies only to samples taken after fasting for at least 8 hours.   BUN 05/24/2022 13  8 - 23 mg/dL Final   Creatinine 05/24/2022 0.60  0.44 - 1.00 mg/dL Final   Calcium 05/24/2022 9.7  8.9 - 10.3 mg/dL Final   Total Protein 05/24/2022 6.4 (L)  6.5 - 8.1 g/dL Final   Albumin 05/24/2022 4.4  3.5 - 5.0 g/dL Final   AST 05/24/2022 15  15 - 41 U/L Final   ALT 05/24/2022 8  0 - 44 U/L Final   Alkaline Phosphatase 05/24/2022 36 (L)  38 - 126 U/L Final   Total Bilirubin 05/24/2022 0.4  0.3 - 1.2 mg/dL Final   GFR, Estimated 05/24/2022 >60  >60 mL/min Final   Comment: (NOTE) Calculated using the CKD-EPI Creatinine Equation (2021)    Anion gap 05/24/2022 6  5 - 15 Final   Performed at [facility], [place] [address] [place], [place] [address]  Office Visit on 02/17/2022  Component Date Value Ref Range Status   Vitamin B-12 02/17/2022 223  211 - 911 pg/mL Final   TSH 02/17/2022 1.02  0.35 - 5.50 uIU/mL Final  Infusion on 01/03/2022  Component Date Value Ref Range Status   LDH 01/03/2022 131  98 - 192 U/L Final   Performed at [facility], [place] [address] [place], [place] [address]   Sodium 01/03/2022 140  135 - 145 mmol/L Final   Potassium 01/03/2022 4.4  3.5 - 5.1 mmol/L Final   Chloride  01/03/2022 104  98 - 111 mmol/L Final   CO2 01/03/2022 31  22 - 32 mmol/L Final  Glucose, Bld 01/03/2022 109 (H)  70 - 99 mg/dL Final   Glucose reference range applies only to samples taken after fasting for at least 8 hours.   BUN 01/03/2022 13  8 - 23 mg/dL Final   Creatinine 01/03/2022 0.74  0.44 - 1.00 mg/dL Final   Calcium 01/03/2022 9.4  8.9 - 10.3 mg/dL Final   Total Protein 01/03/2022 6.6  6.5 - 8.1 g/dL Final   Albumin 01/03/2022 4.3  3.5 - 5.0 g/dL Final   AST 01/03/2022 16  15 - 41 U/L Final   ALT 01/03/2022 10  0 - 44 U/L Final   Alkaline Phosphatase 01/03/2022 36 (L)  38 - 126 U/L Final   Total Bilirubin 01/03/2022 0.6  0.3 - 1.2 mg/dL Final   GFR, Estimated 01/03/2022 >60  >60 mL/min Final   Comment: (NOTE) Calculated using the CKD-EPI Creatinine Equation (2021)    Anion gap 01/03/2022 5  5 - 15 Final   Performed at [facility], [place] [address], [place], [place] [address]   WBC Count 01/03/2022 7.7  4.0 - 10.5 K/uL Final   RBC 01/03/2022 4.66  3.87 - 5.11 MIL/uL Final   Hemoglobin 01/03/2022 14.0  12.0 - 15.0 g/dL Final   HCT 01/03/2022 42.5  36.0 - 46.0 % Final   MCV 01/03/2022 91.2  80.0 - 100.0 fL Final   MCH 01/03/2022 30.0  26.0 - 34.0 pg Final   MCHC 01/03/2022 32.9  30.0 - 36.0 g/dL Final   RDW 01/03/2022 12.8  11.5 - 15.5 % Final   Platelet Count 01/03/2022 213  150 - 400 K/uL Final   nRBC 01/03/2022 0.0  0.0 - 0.2 % Final   Neutrophils Relative % 01/03/2022 69  % Final   Neutro Abs 01/03/2022 5.4  1.7 - 7.7 K/uL Final   Lymphocytes Relative 01/03/2022 19  % Final   Lymphs Abs 01/03/2022 1.5  0.7 - 4.0 K/uL Final   Monocytes Relative 01/03/2022 8  % Final   Monocytes Absolute 01/03/2022 0.6  0.1 - 1.0 K/uL Final   Eosinophils Relative 01/03/2022 3  % Final   Eosinophils Absolute 01/03/2022 0.2  0.0 - 0.5 K/uL Final   Basophils Relative 01/03/2022 1  % Final   Basophils Absolute 01/03/2022 0.0  0.0 - 0.1 K/uL Final   Immature  Granulocytes 01/03/2022 0  % Final   Abs Immature Granulocytes 01/03/2022 0.02  0.00 - 0.07 K/uL Final   Performed at [facility], [place] [address], [place], [place] [address]    Allergies: Codeine  PTA Medications: (Not in a hospital admission)   Medical Decision Making  Patient is recommended for inpatient psychiatric treatment for stabilization and safety. Lab Orders         Resp panel by RT-PCR (RSV, Flu A&B, Covid) Anterior Nasal Swab         CBC with Differential/Platelet         Comprehensive metabolic panel         Hemoglobin A1c         Ethanol         Lipid panel         TSH         POCT Urine Drug Screen - (I-Screen)     EKG    Recommendations  Based on my evaluation the patient does not appear to have an emergency medical condition.  Ophelia Shoulder, NP 06/10/22  10:23 PM

## 2022-06-10 NOTE — ED Notes (Signed)
Pt sitting on her bed very flat affect and sad face expression. She is calm and cooperative. It was offered for her to have something to help her sleep and she declined

## 2022-06-10 NOTE — Progress Notes (Signed)
Patient is a 76 year old female that presents with her son today [name] [phone]) as a voluntary walk in to [facility] with ongoing passive S/I. Patient denies any H/I or AVH. Patient reports a history significant for MDD, GAD being diagnosed in March 2023 by her PCP Damita Dunnings MD at [facility] who initially prescribed Lexapro (See [facility]) for symptom management that patient reports she took for 3 months and after expressing to that provider that she felt that medication was not effective was then prescribed Effexor. Patient reports that she was on that medication for 3 months and self discontinued that medication also 2 weeks ago again because she felt it wasn't working. Patient states her symptoms have intensified over the last two weeks to include sleeping 10 to 12 hours a day, feeling hopeless and "just doesn't want to go on." Son who providers collateral reports that patient attempted to purchase a firearm off a neighbor today saying it was for home protection although admits at the time of triage that she "thought about ending her life" with that weapon although the neighbor contacted patient's son who brought patient in for an evaluation. Patient when asked if she continues to want to end her life with patient reporting, "I don't know any other way" although denies active intent stating she would be hesitant to use a firearm. To note that firearm was never purchased. No previous attempts or gestures at self harm No previous inpatient admissions associated with mental health.  No SA issues.

## 2022-06-10 NOTE — ED Notes (Signed)
Pt admitted to obs for SI. Pt A&O x4, calm and cooperative. Continues to endorse passive SI, denies HI/AVH. Pt tolerated lab work and skin assessment well. Pt ambulated independently to unit. Oriented to unit/staff. Denied offer for food/drink at this time. No signs of acute distress noted. Monitoring for safety.

## 2022-06-10 NOTE — BH Assessment (Addendum)
Comprehensive Clinical Assessment (CCA) Note  06/10/2022 Cathy [name] [id number]  DISPOSITION: Leandro Reasoner, NP completed MSE and determined Pt meets criteria for inpatient geriatric-psychiatry.  The patient demonstrates the following risk factors for suicide: Chronic risk factors for suicide include: psychiatric disorder of GAD . Acute risk factors for suicide include: social withdrawal/isolation. Protective factors for this patient include: positive social support. Considering these factors, the overall suicide risk at this point appears to be moderate. Patient is not appropriate for outpatient follow up.  Pt is a 76 year old widowed female who presents to [facility] accompanied by her son, Cathy [name] [phone], who participated in assessment at Pt's request. Pt reports a history significant for MDD, GAD being diagnosed in March 2023 by her PCP Damita Dunnings MD at [facility] who initially prescribed Lexapro (See [facility]) for symptom management that patient reports she took for 3 months and after expressing to that provider that she felt that medication was not effective was then prescribed Effexor. Patient reports that she was on that medication for 3 months and self discontinued that medication also 2 weeks ago again because she felt it wasn't working. Patient states her symptoms have intensified over the last two weeks to include sleeping 10 to 12 hours a day, feeling hopeless and "just doesn't want to go on." Son who providers collateral reports that Pt attempted to purchase a firearm off a neighbor today saying it was for home protection although admits at the time of triage that she "thought about ending her life" with that weapon although the neighbor contacted Pt's son who brought her in for an evaluation. When asked if she continues to want to end her life, Pt replies, "I don't know any other way" although denies active intent stating she would be hesitant to use a firearm.  Pt describes her mood  as depressed and anxious. She states she has panic attacks approximately 3 times per day. Pt's son reports Pt has lost approximately 30 pounds in the past 6 months. She acknowledges symptoms including social withdrawal, loss of interest in usual pleasures, fatigue, irritability, decreased concentration, decreased sleep, decreased appetite and feelings of guilt, worthlessness and hopelessness. She denies crying spells, adding "I might feel better if I could cry." She denies any history of intentional self-injurious behaviors. Pt denies current homicidal ideation or history of violence. Pt denies any history of auditory or visual hallucinations. She denies history of alcohol or other substance use.  Pt cannot identify any stressors. She has been widowed since 1995, is retired, and lives alone. Cathy [name] is her only child and she identifies him as her primary support. She performs all ADLs independently and identifies chronic medical issues including HTN and pre-diabetes. She has been receiving medication management through PCP but is not currently in therapy. She denies history of abuse or trauma. She denies legal problems. She has no history of inpatient psychiatric treatment.  Pt is casually dressed and well-groomed. She is alert and oriented x4. Pt speaks in a clear tone, at moderate volume and normal pace. Motor behavior appears normal. Eye contact is good. Pt's mood is depressed and anxious; affect is congruent with mood. Thought process is coherent and relevant. There is no indication she is currently responding to internal stimuli or experiencing delusional thought content. She is cooperative.   Chief Complaint: No chief complaint on file.  Visit Diagnosis: F33.2 Major depressive disorder, Recurrent episode, Severe   CCA Screening, Triage and Referral (STR)  Patient Reported Information How did you hear  about [place]? Self  What Is the Reason for Your Visit/Call Today? Patient presents with passive  S/I  How Long Has This Been Causing You Problems? <Week  What Do You Feel Would Help You the Most Today? Treatment for Depression or other mood problem   Have You Recently Had Any Thoughts About Hurting Yourself? Yes  Are You Planning to Commit Suicide/Harm Yourself At This time? No   Flowsheet Row ED from 06/10/2022 in [facility] Admission (Discharged) from 09/03/2020 in [place] Moderate Risk No Risk       Have you Recently Had Thoughts About [place]? No  Are You Planning to Harm Someone at This Time? No  Explanation: Pt reports suicidal ideation and attempted to purchase a gun from a neighbor   Have You Used Any Alcohol or Drugs in the Past 24 Hours? No  What Did You Use and How Much? None   Do You Currently Have a Therapist/Psychiatrist? No  Name of Therapist/Psychiatrist: Name of Therapist/Psychiatrist: None   Have You Been Recently Discharged From Any Office Practice or Programs? No  Explanation of Discharge From Practice/Program: NA     CCA Screening Triage Referral Assessment Type of Contact: Face-to-Face  Telemedicine Service Delivery:   Is this Initial or Reassessment?   Date Telepsych consult ordered in [facility]:    Time Telepsych consult ordered in [facility]:    Location of Assessment: [facility]  Provider Location: [facility] Assessment Services   Collateral Involvement: Pt's son: Dajanae Brophy  [phone]   Does Patient Have a Court Appointed Legal Guardian? No  Legal Guardian Contact Information: NA  Copy of Legal Guardianship Form: -- (NA)  Legal Guardian Notified of Arrival: -- (NA)  Legal Guardian Notified of Pending Discharge: -- (NA)  If Minor and Not Living with Parent(s), Who has Custody? NA  Is CPS involved or ever been involved? Never  Is APS involved or ever been involved? Never   Patient Determined To Be At Risk for Harm To Self or Others Based on  Review of Patient Reported Information or Presenting Complaint? Yes, for Self-Harm  Method: -- (Pt denies homicidal ideation.)  Availability of Means: -- (Pt denies homicidal ideation.)  Intent: -- (Pt denies homicidal ideation.)  Notification Required: -- (Pt denies homicidal ideation.)  Additional Information for Danger to Others Potential: -- (Pt denies homicidal ideation.)  Additional Comments for Danger to Others Potential: Pt denies homicidal ideation.  Are There Guns or Other Weapons in [place]? No  Types of Guns/Weapons: Pt does not have access to firearms  Are These Weapons Safely Secured?                            -- (NA)  Who Could Verify You Are Able To Have These Secured: NA  Do You Have any Outstanding Charges, Pending Court Dates, Parole/Probation? None  Contacted To Inform of Risk of Harm To Self or Others: Family/Significant Other:    Does Patient Present under Involuntary Commitment? No    [place] of Residence: [place]   Patient Currently Receiving the Following Services: Medication Management   Determination of Need: Urgent (48 hours)   Options For Referral: Inpatient Hospitalization; [place]; Outpatient Therapy; Medication Management     CCA Biopsychosocial Patient Reported Schizophrenia/Schizoaffective Diagnosis in Past: No   Strengths: Pt has good family support   Mental Health Symptoms Depression:   Change in  energy/activity; Difficulty Concentrating; Fatigue; Hopelessness; Increase/decrease in appetite; Irritability; Sleep (too much or little); Tearfulness; Weight gain/loss; Worthlessness   Duration of Depressive symptoms:  Duration of Depressive Symptoms: Greater than two weeks   Mania:   None   Anxiety:    Difficulty concentrating; Fatigue; Irritability; Sleep; Tension; Worrying; Restlessness   Psychosis:   None   Duration of Psychotic symptoms:    Trauma:   None   Obsessions:   None    Compulsions:   None   Inattention:   N/A   Hyperactivity/Impulsivity:   N/A   Oppositional/Defiant Behaviors:   N/A   Emotional Irregularity:   None   Other Mood/Personality Symptoms:   NA    Mental Status Exam Appearance and self-care  Stature:   Average   Weight:   Thin   Clothing:   Casual   Grooming:   Normal   Cosmetic use:   Age appropriate   Posture/gait:   Normal   Motor activity:   Not Remarkable   Sensorium  Attention:   Normal   Concentration:   Normal   Orientation:   X5   Recall/memory:   Normal   Affect and Mood  Affect:   Anxious; Depressed   Mood:   Anxious; Depressed   Relating  Eye contact:   Normal   Facial expression:   Anxious; Responsive; Depressed   Attitude toward examiner:   Cooperative   Thought and Language  Speech flow:  Normal   Thought content:   Appropriate to Mood and Circumstances   Preoccupation:   None   Hallucinations:   None   Organization:   Coherent   [employer] of Knowledge:   Average   Intelligence:   Average   Abstraction:   Normal   Judgement:   Fair   Art therapist:   Realistic   Insight:   Fair   Decision Making:   Normal   Social Functioning  Social Maturity:   Responsible; Isolates   Social Judgement:   Normal   Stress  Stressors:   Other (Comment) (Pt cannot identify stressors)   Coping Ability:   Exhausted   Skill Deficits:   None   Supports:   Family     Religion: Religion/Spirituality Are You A Religious Person?: Yes What is Your Religious Affiliation?: Christian How Might This Affect Treatment?: NA  Leisure/Recreation: Leisure / Recreation Do You Have Hobbies?: Yes Leisure and Hobbies: Gardening, going to flea markets  Exercise/Diet: Exercise/Diet Do You Exercise?: Yes What Type of Exercise Do You Do?: Run/Walk How Many Times a Week Do You Exercise?: 1-3 times a week Have You Gained or Lost A  Significant Amount of Weight in the Past Six Months?: Yes-Lost Number of Pounds Lost?: 30 (Pt's son says Pt has lost 30 pounds in 6 months) Do You Follow a Special Diet?: No Do You Have Any Trouble Sleeping?: No (Pt reports sleeping 10-12 hours per night.)   CCA Employment/Education Employment/Work Situation: Employment / Work Copywriter, 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, government has Been Impacted by Current Illness: No Has Patient ever Been in Passenger transport manager?: No  Education: Education Is Patient Currently Attending School?: No Last Grade Completed: 12 Did You Nutritional therapist?: Yes What Type of College Degree Do you Have?: Pt reports she has a bachelor's degree Did You Have An Individualized Education Program (IIEP): No Did You Have Any Difficulty At School?: No Patient's Education Has Been Impacted by Current Illness: No   CCA Family/Childhood History Family  and Relationship History: Family history Marital status: Widowed Widowed, when?: 1995 Does patient have children?: Yes How many children?: 1 How is patient's relationship with their children?: Good relationship with her son  Childhood History:  Childhood History By whom was/is the patient raised?: Both parents Did patient suffer any verbal/emotional/physical/sexual abuse as a child?: No Did patient suffer from severe childhood neglect?: No Has patient ever been sexually abused/assaulted/raped as an adolescent or adult?: No Was the patient ever a victim of a crime or a disaster?: No Witnessed domestic violence?: No Has patient been affected by domestic violence as an adult?: No       CCA Substance Use Alcohol/Drug Use: Alcohol / Drug Use Pain Medications: Denies abuse Prescriptions: Denies abuse Over the Counter: Denies abuse History of alcohol / drug use?: No history of alcohol / drug abuse Longest period of sobriety (when/how long): NA                         ASAM's:  Six Dimensions of  Multidimensional Assessment  Dimension 1:  Acute Intoxication and/or Withdrawal Potential:      Dimension 2:  Biomedical Conditions and Complications:      Dimension 3:  Emotional, Behavioral, or Cognitive Conditions and Complications:     Dimension 4:  Readiness to Change:     Dimension 5:  Relapse, Continued use, or Continued Problem Potential:     Dimension 6:  Recovery/Living Environment:     ASAM Severity Score:    ASAM Recommended Level of Treatment:     Substance use Disorder (SUD)    Recommendations for Services/Supports/Treatments:    Discharge Disposition:    DSM5 Diagnoses: Patient Active Problem List   Diagnosis Date Noted   Major depression in partial remission ([place]) 05/24/2022   Advance care planning 08/16/2021   Anxiety 08/16/2021   Pancytopenia, acquired ([place]) 02/16/2021   Dyspnea 12/22/2020   Anemia due to antineoplastic chemotherapy 10/13/2020   Leukocytosis 10/13/2020   Encounter for antineoplastic chemotherapy 09/15/2020   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[place]) 09/14/2020   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[place]) 08/25/2020   Osteoporosis 07/16/2019   Chronic left shoulder pain 07/16/2019   Fatty liver 06/19/2018   Pulmonary infiltrate 09/19/2016   Neutrophilia 08/18/2015   Insomnia 03/10/2015   Benign paroxysmal positional vertigo 10/28/2014   OA (osteoarthritis) of knee 09/17/2013   Pre-diabetes 09/17/2013   Peripheral neuropathy 09/17/2013   Asthma    Essential hypertension    GERD (gastroesophageal reflux disease)    Hyperlipidemia    Hilar lymphadenopathy 08/01/2011     Referrals to Alternative Service(s): Referred to Alternative Service(s):   Place:   Date:   Time:    Referred to Alternative Service(s):   Place:   Date:   Time:    Referred to Alternative Service(s):   Place:   Date:   Time:    Referred to Alternative Service(s):   Place:   Date:   Time:     Evelena Peat, [place] [facility]

## 2022-06-11 DIAGNOSIS — F332 Major depressive disorder, recurrent severe without psychotic features: Secondary | ICD-10-CM | POA: Diagnosis not present

## 2022-06-11 NOTE — ED Notes (Signed)
Pt A&O x 4, no distress noted, calm & cooperative,pleasant. Monitoring for safety.

## 2022-06-11 NOTE — Discharge Instructions (Addendum)
pt has been accepted to [facility] Accepting provider is Dr. Weber Cooks.

## 2022-06-11 NOTE — Progress Notes (Signed)
Per [facility] admission, pt has been accepted to [facility]. Accepting provider is Dr. Franchot Mimes. Patient can arrive anytime. Number for report is [phone].   Glennie Isle, MSW, LCSW-A Phone: [phone] Disposition/TOC

## 2022-06-11 NOTE — ED Provider Notes (Cosign Needed)
Behavioral Health Progress Note  Date and Time: 06/11/2022 10:53 AM Name: Cathy [name] MRN:  [id number]  Subjective: Patient seen and evaluated face-to-face by this provider, chart reviewed and case discussed with Dr. Dwyane Dee. On evaluation, patient is alert and oriented x 4. Her thought process is linear and speech is clear and coherent. Her mood is dysphoric for her and affect is congruent. She has fair eye contact. She is calm and cooperative and does not appear to be in acute distress. She states that she had a rough night last night because she kept waking up. She states that she has never done anything like this before. She denies past inpatient hospitalizations. She states that she has been experiencing at least 2-3 or more panic attacks a day since last February. She states that the panic attacks are often triggered by her thinking about things that she has no control over. When asked to elaborate, she states not having any motivated to do things such as take a bath. Today, she denies suicidal ideations. She states that yesterday she was not sure if she would ever feel better and wanted to end it all. She denies homicidal ideations. She denies auditory or visual hallucinations. There is no objective evidence that she is currently responding to internal or external stimuli.  She states that she lives alone. She has 1 child. She states that she has been a widow since 52 when her husband died of a heart attack. She is compliant with scheduled medications and denies medication side effects. She denies physical complaints.  HPI: Per chart review,  Cathy [name] is a 76 year old female with psychiatric history of depression and anxiety.  Patient presented voluntarily to [facility] reporting worsening depressive symptoms and suicidal ideation.  Patient is accompanied by her son, Leniya Breit who participated in assessment with permission from patient. Patient reports that she was diagnosed with  depression and anxiety in March 2023.  She says she was initially prescribed Lexapro 10 mg/day. She report she took Lexapro for approximately 3 months but it was not effective and was switched to Effexor 75 mg/day. She report she stopped taking Effexor in Nov 2023 due to feeling dizzy and no relief of depressive symptoms. She says she has been experiencing increased depressive symptoms and panic attacks over that past several months. She reports having 2-3 panic attacks per day. She is unable to identify any triggers. She admits to approaching her neighbor to obtain a firearm today to kill herself. She says she had intended to kill herself but "I don't think I know how to use a gun." She reports she was in "mental pain and wanted to end it." She is endorsing active suicidal ideation. Patient said to this writer "if you can just give me a pill and I can just go to sleep to end all this misery. I just want to go to sleep and not wake up" Patient states "I wish I was dead." She denies history of suicidal attempts or self-harm.  She denies homicidal ideation.  She denies hallucination, paranoia, and substance use.   Diagnosis:  Final diagnoses:  Severe episode of recurrent major depressive disorder, without psychotic features ([place])    Total Time spent with patient: 20 minutes  Past Psychiatric History: history of depression and anxiety. Past Medical History:  Past Medical History:  Diagnosis Date   Abnormal glucose    Allergy    Asthma    Allergy induced   Benign breast cyst in female  GERD (gastroesophageal reflux disease)    Hilar lymphadenopathy 08/01/2011   History of nuclear stress test 2009   Treadmill and Stress Myoview- no CAD   Hyperlipidemia    Hypertension    Lymphadenopathy of left cervical region 08/01/2011   Nephrolithiasis 1984   Osteopenia    PONV (postoperative nausea and vomiting)    Post-menopause    Pre-diabetes    borderline   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    Grade  2   Vision changes     Past Surgical History:  Procedure Laterality Date   CHOLECYSTECTOMY     IR IMAGING GUIDED PORT INSERTION  09/21/2020   LITHOTRIPSY  1984   LYMPH NODE BIOPSY     MASS BIOPSY Left 09/03/2020   Procedure: LEFT NECK JUGULAR NODE;  Surgeon: Rozetta Nunnery, MD;  Location: [facility];  Service: ENT;  Laterality: Left;   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   Procedure: LEFT TOTAL KNEE ARTHROPLASTY;  Surgeon: Gaynelle Arabian, MD;  Location: [facility];  Service: Orthopedics;  Laterality: Left;   Family History:  Family History  Problem Relation Age of Onset   Hypertension Mother    Stroke Mother    Heart attack Mother        CABG, 5 Stents   Dementia Mother    Cancer Father        Kidney   Asthma Sister    Diabetes Brother        Borderline   Alcohol abuse Brother    Cancer Maternal Aunt        stomach ca   Cancer Maternal Aunt        ovarian ca   Breast cancer Cousin    Colon cancer Neg Hx    Family Psychiatric  History: brother history of alcohol abuse. Social History:  Social History   Substance and Sexual Activity  Alcohol Use No     Social History   Substance and Sexual Activity  Drug Use No    Social History   Socioeconomic History   Marital status: Widowed    Spouse name: Not on file   Number of children: Not on file   Years of education: Not on file   Highest education level: Not on file  Occupational History   Not on file  Tobacco Use   Smoking status: Never   Smokeless tobacco: Never  Substance and Sexual Activity   Alcohol use: No   Drug use: No   Sexual activity: Not Currently    Birth control/protection: Post-menopausal  Other Topics Concern   Not on file  Social History Narrative   Lives alone.     Son is [name].     Social Determinants of Health   Financial Resource Strain: Not on file  Food Insecurity: Not on file  Transportation Needs: Not on file  Physical Activity: Not on file   Stress: Not on file  Social Connections: Not on file   SDOH:  SDOH Screenings   Alcohol Screen: Low Risk  (08/10/2020)  Depression (PHQ2-9): Low Risk  (08/10/2020)  Tobacco Use: Low Risk  (05/24/2022)   Additional Social History:    Pain Medications: Denies abuse Prescriptions: Denies abuse Over the Counter: Denies abuse History of alcohol / drug use?: No history of alcohol / drug abuse Longest period of sobriety (when/how long): NA   Current Medications:  Current Facility-Administered Medications  Medication Dose Route Frequency Provider Last Rate Last Admin   acetaminophen (TYLENOL) tablet  650 mg  650 mg Oral Q6H PRN Ajibola, Ene A, NP       alum & mag hydroxide-simeth (MAALOX/MYLANTA) 200-200-20 MG/5ML suspension 30 mL  30 mL Oral Q4H PRN Ajibola, Ene A, NP       aspirin EC tablet 81 mg  81 mg Oral Daily Ajibola, Ene A, NP   81 mg at 06/11/22 0954   atorvastatin (LIPITOR) tablet 20 mg  20 mg Oral Daily Ajibola, Ene A, NP   20 mg at 06/11/22 2878   cholecalciferol (VITAMIN D3) tablet 1,000 Units  1,000 Units Oral Daily Ajibola, Ene A, NP   1,000 Units at 06/11/22 0954   cyanocobalamin (VITAMIN B12) tablet 1,000 mcg  1,000 mcg Oral Daily Ajibola, Ene A, NP   1,000 mcg at 06/11/22 0955   hydrochlorothiazide (HYDRODIURIL) tablet 12.5 mg  12.5 mg Oral Daily Ajibola, Ene A, NP   12.5 mg at 06/11/22 0955   hydrOXYzine (ATARAX) tablet 10 mg  10 mg Oral Q8H PRN Ajibola, Ene A, NP       losartan (COZAAR) tablet 100 mg  100 mg Oral Daily Ajibola, Ene A, NP   100 mg at 06/11/22 0952   magnesium hydroxide (MILK OF MAGNESIA) suspension 30 mL  30 mL Oral Daily PRN Ajibola, Ene A, NP       metoprolol succinate (TOPROL-XL) 24 hr tablet 50 mg  50 mg Oral Daily Ajibola, Ene A, NP   50 mg at 06/11/22 0954   multivitamin with minerals tablet 1 tablet  1 tablet Oral Daily Ajibola, Ene A, NP   1 tablet at 06/11/22 0952   pantoprazole (PROTONIX) EC tablet 40 mg  40 mg Oral Daily Ajibola, Ene A, NP   40  mg at 06/11/22 0953   traZODone (DESYREL) tablet 50 mg  50 mg Oral QHS PRN Ajibola, Ene A, NP       venlafaxine XR (EFFEXOR-XR) 24 hr capsule 150 mg  150 mg Oral Q breakfast Ajibola, Ene A, NP   150 mg at 06/11/22 0802   Current Outpatient Medications  Medication Sig Dispense Refill   albuterol (VENTOLIN HFA) 108 (90 Base) MCG/ACT inhaler Inhale 2 puffs into the lungs every 6 (six) hours as needed for wheezing or shortness of breath. 8 g 2   aspirin EC 81 MG tablet Take 81 mg by mouth daily.     atorvastatin (LIPITOR) 20 MG tablet Take 1 tablet (20 mg total) by mouth daily. 90 tablet 1   cetirizine (ZYRTEC) 10 MG tablet Take 1 tablet (10 mg total) by mouth daily. 90 tablet 1   estradiol (ESTRACE) 0.1 MG/GM vaginal cream Discard plastic applicator. Insert blueberry size amount of cream on finger in vagina daily x1 week then 2x per week.     hydrochlorothiazide (MICROZIDE) 12.5 MG capsule Take 1 capsule (12.5 mg total) by mouth daily. 90 capsule 1   hydroxypropyl methylcellulose / hypromellose (ISOPTO TEARS / GONIOVISC) 2.5 % ophthalmic solution Place 2 drops into both eyes 3 (three) times daily as needed for dry eyes.     lidocaine-prilocaine (EMLA) cream Apply to affected area once (Patient taking differently: 1 Application as needed. Apply to affected area to port as needed) 30 g 3   metoprolol succinate (TOPROL-XL) 50 MG 24 hr tablet Take 1 tablet (50 mg total) by mouth daily. Take with or immediately following a meal. 90 tablet 1   Multiple Vitamin (MULTIVITAMIN) capsule Take 1 capsule by mouth daily.     omeprazole (PRILOSEC) 20  MG capsule Take 1 capsule (20 mg total) by mouth daily. 90 capsule 1   venlafaxine XR (EFFEXOR XR) 75 MG 24 hr capsule Take 2 capsules (150 mg total) by mouth daily with breakfast. (Patient taking differently: Take 75 mg by mouth daily with breakfast.)     cyanocobalamin (VITAMIN B12) 1000 MCG tablet Take 1 tablet (1,000 mcg total) by mouth daily.     denosumab  (PROLIA) 60 MG/ML SOSY injection Inject 60 mg into the skin every 6 (six) months.     fluticasone furoate-vilanterol (BREO ELLIPTA) 100-25 MCG/ACT AEPB Inhale 1 puff into the lungs daily. (Patient taking differently: Inhale 1 puff into the lungs daily as needed.) 60 each 5   glucose blood (ACCU-CHEK AVIVA PLUS) test strip Use to Monitor blood sugars daily. DX. E11.9 100 each 5   losartan (COZAAR) 100 MG tablet TAKE 1 TABLET BY MOUTH ONCE A DAY 90 tablet 1    Labs  Lab Results:  Admission on 06/10/2022  Component Date Value Ref Range Status   SARS Coronavirus 2 by RT PCR 06/10/2022 NEGATIVE  NEGATIVE Final   Comment: (NOTE) SARS-CoV-2 target nucleic acids are NOT DETECTED.  The SARS-CoV-2 RNA is generally detectable in upper respiratory specimens during the acute phase of infection. The lowest concentration of SARS-CoV-2 viral copies this assay can detect is 138 copies/mL. A negative result does not preclude SARS-Cov-2 infection and should not be used as the sole basis for treatment or other patient management decisions. A negative result may occur with  improper specimen collection/handling, submission of specimen other than nasopharyngeal swab, presence of viral mutation(s) within the areas targeted by this assay, and inadequate number of viral copies(<138 copies/mL). A negative result must be combined with clinical observations, patient history, and epidemiological information. The expected result is Negative.  Fact Sheet for Patients:  EntrepreneurPulse.com.au  Fact Sheet for Healthcare Providers:  IncredibleEmployment.be  This test is no                          t yet approved or cleared by the [place] FDA and  has been authorized for detection and/or diagnosis of SARS-CoV-2 by FDA under an Emergency Use Authorization (EUA). This EUA will remain  in effect (meaning this test can be used) for the duration of the COVID-19 declaration  under Section 564(b)(1) of the Act, 21 U.S.C.section 360bbb-3(b)(1), unless the authorization is terminated  or revoked sooner.       Influenza A by PCR 06/10/2022 NEGATIVE  NEGATIVE Final   Influenza B by PCR 06/10/2022 NEGATIVE  NEGATIVE Final   Comment: (NOTE) The Xpert Xpress SARS-CoV-2/FLU/RSV plus assay is intended as an aid in the diagnosis of influenza from Nasopharyngeal swab specimens and should not be used as a sole basis for treatment. Nasal washings and aspirates are unacceptable for Xpert Xpress SARS-CoV-2/FLU/RSV testing.  Fact Sheet for Patients: EntrepreneurPulse.com.au  Fact Sheet for Healthcare Providers: IncredibleEmployment.be  This test is not yet approved or cleared by the [place] FDA and has been authorized for detection and/or diagnosis of SARS-CoV-2 by FDA under an Emergency Use Authorization (EUA). This EUA will remain in effect (meaning this test can be used) for the duration of the COVID-19 declaration under Section 564(b)(1) of the Act, 21 U.S.C. section 360bbb-3(b)(1), unless the authorization is terminated or revoked.     Resp Syncytial Virus by PCR 06/10/2022 NEGATIVE  NEGATIVE Final   Comment: (NOTE) Fact Sheet for Patients: EntrepreneurPulse.com.au  Fact  Sheet for Healthcare Providers: IncredibleEmployment.be  This test is not yet approved or cleared by the [place] and has been authorized for detection and/or diagnosis of SARS-CoV-2 by FDA under an Emergency Use Authorization (EUA). This EUA will remain in effect (meaning this test can be used) for the duration of the COVID-19 declaration under Section 564(b)(1) of the Act, 21 U.S.C. section 360bbb-3(b)(1), unless the authorization is terminated or revoked.  Performed at [facility], [place] [address] [place], [place] [address]    WBC 06/10/2022 8.6  4.0 - 10.5 K/uL Final   RBC 06/10/2022 5.44  (H)  3.87 - 5.11 MIL/uL Final   Hemoglobin 06/10/2022 16.3 (H)  12.0 - 15.0 g/dL Final   HCT 06/10/2022 48.3 (H)  36.0 - 46.0 % Final   MCV 06/10/2022 88.8  80.0 - 100.0 fL Final   MCH 06/10/2022 30.0  26.0 - 34.0 pg Final   MCHC 06/10/2022 33.7  30.0 - 36.0 g/dL Final   RDW 06/10/2022 13.2  11.5 - 15.5 % Final   Platelets 06/10/2022 248  150 - 400 K/uL Final   nRBC 06/10/2022 0.0  0.0 - 0.2 % Final   Neutrophils Relative % 06/10/2022 69  % Final   Neutro Abs 06/10/2022 6.0  1.7 - 7.7 K/uL Final   Lymphocytes Relative 06/10/2022 20  % Final   Lymphs Abs 06/10/2022 1.7  0.7 - 4.0 K/uL Final   Monocytes Relative 06/10/2022 9  % Final   Monocytes Absolute 06/10/2022 0.7  0.1 - 1.0 K/uL Final   Eosinophils Relative 06/10/2022 1  % Final   Eosinophils Absolute 06/10/2022 0.1  0.0 - 0.5 K/uL Final   Basophils Relative 06/10/2022 1  % Final   Basophils Absolute 06/10/2022 0.0  0.0 - 0.1 K/uL Final   Immature Granulocytes 06/10/2022 0  % Final   Abs Immature Granulocytes 06/10/2022 0.03  0.00 - 0.07 K/uL Final   Performed at [facility], [place] [address] [place], [place] [address]   Sodium 06/10/2022 140  135 - 145 mmol/L Final   Potassium 06/10/2022 4.2  3.5 - 5.1 mmol/L Final   Chloride 06/10/2022 105  98 - 111 mmol/L Final   CO2 06/10/2022 25  22 - 32 mmol/L Final   Glucose, Bld 06/10/2022 114 (H)  70 - 99 mg/dL Final   Glucose reference range applies only to samples taken after fasting for at least 8 hours.   BUN 06/10/2022 9  8 - 23 mg/dL Final   Creatinine, Ser 06/10/2022 0.70  0.44 - 1.00 mg/dL Final   Calcium 06/10/2022 9.2  8.9 - 10.3 mg/dL Final   Total Protein 06/10/2022 6.8  6.5 - 8.1 g/dL Final   Albumin 06/10/2022 4.6  3.5 - 5.0 g/dL Final   AST 06/10/2022 20  15 - 41 U/L Final   ALT 06/10/2022 15  0 - 44 U/L Final   Alkaline Phosphatase 06/10/2022 43  38 - 126 U/L Final   Total Bilirubin 06/10/2022 0.9  0.3 - 1.2 mg/dL Final   GFR, Estimated 06/10/2022 >60  >60  mL/min Final   Comment: (NOTE) Calculated using the CKD-EPI Creatinine Equation (2021)    Anion gap 06/10/2022 10  5 - 15 Final   Performed at [place] [address] [place], [place] [address]   Hgb A1c MFr Bld 06/10/2022 5.5  4.8 - 5.6 % Final   Comment: (NOTE) Pre diabetes:  5.7%-6.4%  Diabetes:              >6.4%  Glycemic control for   <7.0% adults with diabetes    Mean Plasma Glucose 06/10/2022 111.15  mg/dL Final   Performed at [facility], [place] [address], [place], [place] [address]   Alcohol, Ethyl (B) 06/10/2022 <10  <10 mg/dL Final   Comment: (NOTE) Lowest detectable limit for serum alcohol is 10 mg/dL.  For medical purposes only. Performed at [facility], [place] [address], [place], [place] [address]    POC Amphetamine UR 06/10/2022 None Detected  NONE DETECTED (Cut Off Level 1000 ng/mL) Preliminary   POC Secobarbital (BAR) 06/10/2022 None Detected  NONE DETECTED (Cut Off Level 300 ng/mL) Preliminary   POC Buprenorphine (BUP) 06/10/2022 None Detected  NONE DETECTED (Cut Off Level 10 ng/mL) Preliminary   POC Oxazepam (BZO) 06/10/2022 None Detected  NONE DETECTED (Cut Off Level 300 ng/mL) Preliminary   POC Cocaine UR 06/10/2022 None Detected  NONE DETECTED (Cut Off Level 300 ng/mL) Preliminary   POC Methamphetamine UR 06/10/2022 None Detected  NONE DETECTED (Cut Off Level 1000 ng/mL) Preliminary   POC Morphine 06/10/2022 None Detected  NONE DETECTED (Cut Off Level 300 ng/mL) Preliminary   POC Methadone UR 06/10/2022 None Detected  NONE DETECTED (Cut Off Level 300 ng/mL) Preliminary   POC Oxycodone UR 06/10/2022 None Detected  NONE DETECTED (Cut Off Level 100 ng/mL) Preliminary   POC Marijuana UR 06/10/2022 None Detected  NONE DETECTED (Cut Off Level 50 ng/mL) Preliminary   Cholesterol 06/10/2022 246 (H)  0 - 200 mg/dL Final   Triglycerides 06/10/2022 94  <150 mg/dL Final   HDL 06/10/2022 57  >40 mg/dL Final   Total CHOL/HDL Ratio  06/10/2022 4.3  RATIO Final   VLDL 06/10/2022 19  0 - 40 mg/dL Final   LDL Cholesterol 06/10/2022 170 (H)  0 - 99 mg/dL Final   Comment:        Total Cholesterol/HDL:CHD Risk Coronary Heart Disease Risk Table                     Men   Women  1/2 Average Risk   3.4   3.3  Average Risk       5.0   4.4  2 X Average Risk   9.6   7.1  3 X Average Risk  23.4   11.0        Use the calculated Patient Ratio above and the CHD Risk Table to determine the patient's CHD Risk.        ATP III CLASSIFICATION (LDL):  <100     mg/dL   Optimal  100-129  mg/dL   Near or Above                    Optimal  130-159  mg/dL   Borderline  160-189  mg/dL   High  >190     mg/dL   Very High Performed at [place] [address] [place], [place] [address]    TSH 06/10/2022 1.797  0.350 - 4.500 uIU/mL Final   Comment: Performed by a 3rd Generation assay with a functional sensitivity of <=0.01 uIU/mL. Performed at [facility], [place] [address] [place], [place] [address]   Infusion on 05/24/2022  Component Date Value Ref Range Status   WBC Count 05/24/2022 5.8  4.0 - 10.5 K/uL Final   RBC 05/24/2022 5.08  3.87 -  5.11 MIL/uL Final   Hemoglobin 05/24/2022 15.2 (H)  12.0 - 15.0 g/dL Final   HCT 05/24/2022 45.4  36.0 - 46.0 % Final   MCV 05/24/2022 89.4  80.0 - 100.0 fL Final   MCH 05/24/2022 29.9  26.0 - 34.0 pg Final   MCHC 05/24/2022 33.5  30.0 - 36.0 g/dL Final   RDW 05/24/2022 12.8  11.5 - 15.5 % Final   Platelet Count 05/24/2022 243  150 - 400 K/uL Final   nRBC 05/24/2022 0.0  0.0 - 0.2 % Final   Neutrophils Relative % 05/24/2022 69  % Final   Neutro Abs 05/24/2022 4.0  1.7 - 7.7 K/uL Final   Lymphocytes Relative 05/24/2022 22  % Final   Lymphs Abs 05/24/2022 1.3  0.7 - 4.0 K/uL Final   Monocytes Relative 05/24/2022 8  % Final   Monocytes Absolute 05/24/2022 0.5  0.1 - 1.0 K/uL Final   Eosinophils Relative 05/24/2022 1  % Final   Eosinophils Absolute 05/24/2022 0.0  0.0 - 0.5 K/uL  Final   Basophils Relative 05/24/2022 0  % Final   Basophils Absolute 05/24/2022 0.0  0.0 - 0.1 K/uL Final   Immature Granulocytes 05/24/2022 0  % Final   Abs Immature Granulocytes 05/24/2022 0.02  0.00 - 0.07 K/uL Final   Performed at [facility], [place] [address], [place], [place] [address]   Sodium 05/24/2022 142  135 - 145 mmol/L Final   Potassium 05/24/2022 4.0  3.5 - 5.1 mmol/L Final   Chloride 05/24/2022 108  98 - 111 mmol/L Final   CO2 05/24/2022 28  22 - 32 mmol/L Final   Glucose, Bld 05/24/2022 99  70 - 99 mg/dL Final   Glucose reference range applies only to samples taken after fasting for at least 8 hours.   BUN 05/24/2022 13  8 - 23 mg/dL Final   Creatinine 05/24/2022 0.60  0.44 - 1.00 mg/dL Final   Calcium 05/24/2022 9.7  8.9 - 10.3 mg/dL Final   Total Protein 05/24/2022 6.4 (L)  6.5 - 8.1 g/dL Final   Albumin 05/24/2022 4.4  3.5 - 5.0 g/dL Final   AST 05/24/2022 15  15 - 41 U/L Final   ALT 05/24/2022 8  0 - 44 U/L Final   Alkaline Phosphatase 05/24/2022 36 (L)  38 - 126 U/L Final   Total Bilirubin 05/24/2022 0.4  0.3 - 1.2 mg/dL Final   GFR, Estimated 05/24/2022 >60  >60 mL/min Final   Comment: (NOTE) Calculated using the CKD-EPI Creatinine Equation (2021)    Anion gap 05/24/2022 6  5 - 15 Final   Performed at [facility], [place] [address] [place], [place] [address]  Office Visit on 02/17/2022  Component Date Value Ref Range Status   Vitamin B-12 02/17/2022 223  211 - 911 pg/mL Final   TSH 02/17/2022 1.02  0.35 - 5.50 uIU/mL Final  Infusion on 01/03/2022  Component Date Value Ref Range Status   LDH 01/03/2022 131  98 - 192 U/L Final   Performed at [facility], [place] [address] [place], [place] [address]   Sodium 01/03/2022 140  135 - 145 mmol/L Final   Potassium 01/03/2022 4.4  3.5 - 5.1 mmol/L Final   Chloride 01/03/2022 104  98 - 111 mmol/L Final   CO2 01/03/2022 31  22 - 32 mmol/L Final    Glucose, Bld 01/03/2022 109 (H)  70 - 99 mg/dL Final   Glucose reference range  applies only to samples taken after fasting for at least 8 hours.   BUN 01/03/2022 13  8 - 23 mg/dL Final   Creatinine 01/03/2022 0.74  0.44 - 1.00 mg/dL Final   Calcium 01/03/2022 9.4  8.9 - 10.3 mg/dL Final   Total Protein 01/03/2022 6.6  6.5 - 8.1 g/dL Final   Albumin 01/03/2022 4.3  3.5 - 5.0 g/dL Final   AST 01/03/2022 16  15 - 41 U/L Final   ALT 01/03/2022 10  0 - 44 U/L Final   Alkaline Phosphatase 01/03/2022 36 (L)  38 - 126 U/L Final   Total Bilirubin 01/03/2022 0.6  0.3 - 1.2 mg/dL Final   GFR, Estimated 01/03/2022 >60  >60 mL/min Final   Comment: (NOTE) Calculated using the CKD-EPI Creatinine Equation (2021)    Anion gap 01/03/2022 5  5 - 15 Final   Performed at [facility], [place] [address] [place], [place] [address]   WBC Count 01/03/2022 7.7  4.0 - 10.5 K/uL Final   RBC 01/03/2022 4.66  3.87 - 5.11 MIL/uL Final   Hemoglobin 01/03/2022 14.0  12.0 - 15.0 g/dL Final   HCT 01/03/2022 42.5  36.0 - 46.0 % Final   MCV 01/03/2022 91.2  80.0 - 100.0 fL Final   MCH 01/03/2022 30.0  26.0 - 34.0 pg Final   MCHC 01/03/2022 32.9  30.0 - 36.0 g/dL Final   RDW 01/03/2022 12.8  11.5 - 15.5 % Final   Platelet Count 01/03/2022 213  150 - 400 K/uL Final   nRBC 01/03/2022 0.0  0.0 - 0.2 % Final   Neutrophils Relative % 01/03/2022 69  % Final   Neutro Abs 01/03/2022 5.4  1.7 - 7.7 K/uL Final   Lymphocytes Relative 01/03/2022 19  % Final   Lymphs Abs 01/03/2022 1.5  0.7 - 4.0 K/uL Final   Monocytes Relative 01/03/2022 8  % Final   Monocytes Absolute 01/03/2022 0.6  0.1 - 1.0 K/uL Final   Eosinophils Relative 01/03/2022 3  % Final   Eosinophils Absolute 01/03/2022 0.2  0.0 - 0.5 K/uL Final   Basophils Relative 01/03/2022 1  % Final   Basophils Absolute 01/03/2022 0.0  0.0 - 0.1 K/uL Final   Immature Granulocytes 01/03/2022 0  % Final   Abs Immature Granulocytes 01/03/2022 0.02  0.00  - 0.07 K/uL Final   Performed at [facility], [place] [address] [place],  [address]    Blood Alcohol level:  Lab Results  Component Value Date   ETH <10 02/40/9735    Metabolic Disorder Labs: Lab Results  Component Value Date   HGBA1C 5.5 06/10/2022   MPG 111.15 06/10/2022   MPG 105 04/05/2021   No results found for: "PROLACTIN" Lab Results  Component Value Date   CHOL 246 (H) 06/10/2022   TRIG 94 06/10/2022   HDL 57 06/10/2022   CHOLHDL 4.3 06/10/2022   VLDL 19 06/10/2022   LDLCALC 170 (H) 06/10/2022   LDLCALC 67 04/05/2021    Therapeutic Lab Levels: No results found for: "LITHIUM" No results found for: "VALPROATE" No results found for: "CBMZ"  Physical Findings   PHQ2-9    [place] Office Visit from 08/10/2020 in [place] Office Visit from 01/13/2020 in [place] Office Visit from 07/16/2019 in [place] Office Visit from 04/08/2019 in [place] Office Visit from 12/03/2018 in [place]  PHQ-2 Total Score 0 0 0  0 0      Flowsheet Row ED from 06/10/2022 in [facility] Admission (Discharged) from 09/03/2020 in [place] High Risk No Risk        Musculoskeletal  Strength & Muscle Tone: within normal limits Gait & Station: normal Patient leans: N/A  Psychiatric Specialty Exam  Presentation  General Appearance:  Appropriate for Environment  Eye Contact: Fair  Speech: Clear and Coherent  Speech Volume: Normal  Handedness: Right   Mood and Affect  Mood: Dysphoric  Affect: Congruent   Thought Process  Thought Processes: Coherent  Descriptions of Associations:Intact  Orientation:Full (Time, Place and Person)  Thought Content:Logical  Diagnosis of Schizophrenia or Schizoaffective disorder in past: No    Hallucinations:Hallucinations: None  Ideas of  Reference:None  Suicidal Thoughts:Suicidal Thoughts: No SI Active Intent and/or Plan: With Plan  Homicidal Thoughts:Homicidal Thoughts: No   Sensorium  Memory: Immediate Fair; Recent Fair; Remote Fair  Judgment: Intact  Insight: Present   Executive Functions  Concentration: Fair  Attention Span: Fair  Recall: [employer] of Knowledge: Fair  Language: Fair   Psychomotor Activity  Psychomotor Activity: Psychomotor Activity: Normal   Assets  Assets: Armed forces logistics/support/administrative officer; Desire for Improvement; Financial Resources/Insurance; Housing; Leisure Time; Physical Health; Social Support   Sleep  Sleep: Sleep: Fair Number of Hours of Sleep: 10   Nutritional Assessment (For OBS and FBC admissions only) Has the patient had a weight loss or gain of 10 pounds or more in the last 3 months?: Yes Has the patient had a decrease in food intake/or appetite?: Yes Does the patient have dental problems?: No Does the patient have eating habits or behaviors that may be indicators of an eating disorder including binging or inducing vomiting?: No Has the patient recently lost weight without trying?: 3 Has the patient been eating poorly because of a decreased appetite?: 1 Malnutrition Screening Tool Score: 4 Nutritional Assessment Referrals: Refer to Primary Care Provider    Physical Exam  Physical Exam HENT:     Head: Normocephalic.  Cardiovascular:     Rate and Rhythm: Normal rate.  Pulmonary:     Effort: Pulmonary effort is normal.  Musculoskeletal:        General: Normal range of motion.     Cervical back: Normal range of motion.  Neurological:     Mental Status: She is alert and oriented to person, place, and time.    Review of Systems  Constitutional: Negative.   HENT: Negative.    Eyes: Negative.   Respiratory: Negative.    Cardiovascular: Negative.   Gastrointestinal: Negative.   Genitourinary: Negative.   Musculoskeletal: Negative.   Neurological:  Negative.   Endo/Heme/Allergies: Negative.   Psychiatric/Behavioral:  Positive for depression. The patient is nervous/anxious.    Blood pressure (!) 141/95, pulse 88, temperature (!) 97.5 F (36.4 C), temperature source Oral, resp. rate 16, SpO2 98 %. There is no height or weight on file to calculate BMI.  Treatment Plan Summary: Patient is voluntary. Patient is recommended for inpatient psychiatric treatment. CSW to seek appropriate inpatient psychiatric treatment. Patient is currently under review at [facility] for [facility]- placement.  MDD Continue Effexor 150 mg p.o. daily  Marissa Calamity, NP 06/11/2022 10:53 AM

## 2022-06-11 NOTE — Progress Notes (Signed)
Per Darrol Angel, NP, patient meets criteria for inpatient treatment. There are no available beds at [facility] today. CSW faxed referrals to the following facilities for review:  [facility]  Pending - No Request Sent N/A [address] [place] [address] [phone] --  [facility]  Pending - No Request Sent N/A [address], [place] [address] [phone] --  [facility]  Pending - No Request [address] [place] [address] [phone] --  [place] [address] [place] [address] [phone] --  [place]  Pending - No Request Sent N/A [address] [place] [address] [phone] --  [facility]  Pending - No Request Sent N/A [address] [place], [place] [address] [phone] --  [facility]  Pending - No Request Sent N/A [address] [place] [address] [place] --  [facility]  Pending - No Request Sent N/A [address], [place] [address] [facility]  Pending - No Request Sent N/A [address] [place] [address] [phone] --  [facility]  Pending - No Request Sent N/A [address] [place] [address] [phone] --  [place]  Pending - No Request Sent N/A [address], [place] [address] [phone] --  [facility]  Pending - No Request Sent N/A [address] [place] [address] [phone] --  [place]  Pending - No Request Sent N/A [address] [place] [address] [phone] --  [facility]  Pending - No Request Sent N/A [address], [place] [address] [phone] --  [facility]  Pending - No Request Sent N/A [address] [place] [address] [phone] --   TTS will continue to seek bed placement.  Glennie Isle, MSW, [facility] Phone: [phone] Disposition/TOC

## 2022-06-11 NOTE — ED Notes (Signed)
This nurse went to take patients vitals for EMTALA. I advised the patient of her pending transfer to [place] response was she did not want to go that far away from family. This nurse notified the patients provider

## 2022-06-11 NOTE — ED Notes (Signed)
Patient sitting quietly in bed, no distress noted, will continue to monitor patient for safety

## 2022-06-11 NOTE — ED Notes (Signed)
Pt sleeping@this time. Breathing even and unlabored. Will continue to monitor for safety 

## 2022-06-11 NOTE — ED Notes (Signed)
Pt sleeping at present, no distress noted.  Monitoring for safety. 

## 2022-06-11 NOTE — ED Notes (Signed)
Patient sitting on the bed watching television quietly, no distress noted, will continue to monitor

## 2022-06-11 NOTE — ED Notes (Signed)
Patient is sitting quietly in bed, no distress noted, breakfast offered, patient only wanted juice. Will continue to monitor patient for safety.

## 2022-06-12 DIAGNOSIS — F332 Major depressive disorder, recurrent severe without psychotic features: Secondary | ICD-10-CM | POA: Diagnosis not present

## 2022-06-12 NOTE — ED Notes (Signed)
Pt awake and restless, no distress noted.  Monitoring for safety.

## 2022-06-12 NOTE — ED Notes (Signed)
Rn offered patient morning medication and she refused to take the medication.

## 2022-06-12 NOTE — Progress Notes (Signed)
CSW inquired about pt being accepted to [facility] for inpatient behavioral health placement.  Per Waylan Boga, NP there may not be bed availability until Tuesday at [facility] gero unit. CSW advised that pt can transition to [place] 06/12/22. CSW informed that report can be called now. Per [facility] admission, pt has been accepted to [facility]. Accepting provider is Dr. Franchot Mimes. Patient can arrive anytime. Number for report is [phone].  Care Team notified: Mardene Sayer, RN, Darrol Angel, NP, Caren Griffins, DO, Alethia Berthold, MD, Amy Boisvert, RN, Marylee Floras, RN, Denna Haggard, LCSWA, Waylan Boga, NP, Oris Drone, RN  Benjaman Kindler, MSW, [facility] 06/12/2022 1:11 PM

## 2022-06-12 NOTE — ED Notes (Signed)
Patient decided to take her venlafaxine  with her other medication.

## 2022-06-12 NOTE — ED Notes (Signed)
Rn notified rn at [facility] that she would be notified when we hear back from transport.

## 2022-06-12 NOTE — ED Notes (Signed)
Patient  is pacing in no acute stress. RR even and unlabored .Environment secured .Will continue to monitor for safely.

## 2022-06-12 NOTE — ED Notes (Signed)
Son Cathy [name] called to check on the patient.

## 2022-06-12 NOTE — ED Notes (Signed)
Patient is resting comfortably. 

## 2022-06-12 NOTE — ED Notes (Signed)
[place] report called  to [phone]

## 2022-06-12 NOTE — ED Notes (Signed)
Patient observed/assessed in room in bed appearing in no immediate distress resting peacefully. Q15 minute checks continued by MHT and nursing staff. Will continue to monitor and support. 

## 2022-06-12 NOTE — ED Notes (Signed)
Patient observed/assessed on unit standing next to bed/chair. Patient was observed without her pants on in only depends from the waste down. Patient is disoriented to where she is and date. Patient's affect is flat and eye contact is constant. Patient is slow to respond and voice is low. Patient verbalizes no complaints at this time and was searching for sneakers that were not given to her on the unit. This was again explained to her but it does not seem she is able to understand. She denies anxiety and pain. She has been consistently standing at the window staring into the nursing station through the window. Multiple attempts have been made by staff requesting if she needs anything to which she shakes her head back and in "no" way. It was reported that she had not eaten throughout the day and she refused snacks/fluids. Will continue to monitor/support.

## 2022-06-12 NOTE — ED Notes (Signed)
Patient  resting in no acute stress. RR even and unlabored .Environment secured .Will continue to monitor for safely. 

## 2022-06-12 NOTE — ED Provider Notes (Signed)
FBC/OBS ASAP Discharge Summary  Date and Time: 06/12/2022 1:17 PM  Name: Cathy [name]  MRN:  [id number]   Discharge Diagnoses:  Final diagnoses:  Severe episode of recurrent major depressive disorder, without psychotic features [facility])    Subjective:  Patient seen and evaluated face-to-face by this provider, chart reviewed and case discussed with Dr. Dwyane Dee. On evaluation, patient is alert and oriented x 4. Her thought process is linear and speech is clear and coherent. Her mood is dysphoric for her affect is irritable. She has fair eye contact. She denies SI/HI/AVH. There is no objective evidence that the patient is responding to internal or external stimuli.   Patient states that she is upset because she was lied to. She states that she feels like she is being force to go to the hospital. She states that she wants to go home. She states that she would never do anything to kill herself. I discussed with the patient safety concerns with her inquiring about buying a gun from her neighbor to end her life and worsening depressive symptoms. She ruminates about being placed under IVC. She does not appear to understand the severity of her tying to get access to a gun and making suicidal statements. I reiterated to the patient our discussion yesterday regarding me reaching out to her son to safety plan however, she declined because she feels like he is too busy because he is a doctor. I discussed with the patient at length that she is recommended for inpatient treatment due to high risk of suicide and concerns for her safety. I dicussed with the patient that we are still waiting to hear back from [facility] regarding bed availability today. However, per [facility], no beds available today and it could be Tuesday before a bed becomes available. [facility] treatment team discussed seeking other inpatient psych facilities for treatment. Patient accepted to [facility] today. Patient was informed of acceptance to  [facility] by [place] Fl[place], [place].    Stay Summary: Per chart review,  Cathy [name] is a 76 year old female with psychiatric history of depression and anxiety.  Patient presented voluntarily to [facility] reporting worsening depressive symptoms and suicidal ideation.  Patient is accompanied by her son, Lenox Ladouceur who participated in assessment with permission from patient. Patient reports that she was diagnosed with depression and anxiety in March 2023.  She says she was initially prescribed Lexapro 10 mg/day. She report she took Lexapro for approximately 3 months but it was not effective and was switched to Effexor 75 mg/day. She report she stopped taking Effexor in Nov 2023 due to feeling dizzy and no relief of depressive symptoms. She says she has been experiencing increased depressive symptoms and panic attacks over that past several months. She reports having 2-3 panic attacks per day. She is unable to identify any triggers. She admits to approaching her neighbor to obtain a firearm today to kill herself. She says she had intended to kill herself but "I don't think I know how to use a gun." She reports she was in "mental pain and wanted to end it." She is endorsing active suicidal ideation. Patient said to this writer "if you can just give me a pill and I can just go to sleep to end all this misery. I just want to go to sleep and not wake up" Patient states "I wish I was dead." She denies history of suicidal attempts or self-harm.  She denies homicidal ideation.  She denies hallucination, paranoia,  and substance use.   Total Time spent with patient: 20 minutes  Past Psychiatric History: History of anxiety and depression   Past Medical History:  Past Medical History:  Diagnosis Date   Abnormal glucose    Allergy    Asthma    Allergy induced   Benign breast cyst in female    GERD (gastroesophageal reflux disease)    Hilar lymphadenopathy 08/01/2011   History of nuclear stress test 2009    Treadmill and Stress Myoview- no CAD   Hyperlipidemia    Hypertension    Lymphadenopathy of left cervical region 08/01/2011   Nephrolithiasis 1984   Osteopenia    PONV (postoperative nausea and vomiting)    Post-menopause    Pre-diabetes    borderline   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    Grade 2   Vision changes     Past Surgical History:  Procedure Laterality Date   CHOLECYSTECTOMY     IR IMAGING GUIDED PORT INSERTION  09/21/2020   LITHOTRIPSY  1984   LYMPH NODE BIOPSY     MASS BIOPSY Left 09/03/2020   Procedure: LEFT NECK JUGULAR NODE;  Surgeon: Rozetta Nunnery, MD;  Location: [place];  Service: ENT;  Laterality: Left;   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  Social History:  Social History   Substance and Sexual Activity  Alcohol Use No     Social History   Substance and Sexual Activity  Drug Use No    Social History   Socioeconomic History   Marital status: Widowed    Spouse name: Not on file   Number of children: Not on file   Years of education: Not on file   Highest education level: Not on file  Occupational History   Not on file  Tobacco Use   Smoking status: Never   Smokeless tobacco: Never  Substance and Sexual Activity   Alcohol use: No   Drug use: No   Sexual activity: Not Currently    Birth control/protection: Post-menopausal   Other Topics Concern   Not on file  Social History Narrative   Lives alone.     Son is [name].     Social Determinants of Health   Financial Resource Strain: Not on file  Food Insecurity: Not on file  Transportation Needs: Not on file  Physical Activity: Not on file  Stress: Not on file  Social Connections: Not on file   SDOH:  SDOH Screenings   Alcohol Screen: Low Risk  (08/10/2020)  Depression (PHQ2-9): Low Risk  (08/10/2020)  Tobacco Use: Low Risk  (05/24/2022)    Tobacco Cessation:  N/A, patient does not currently use tobacco products  Current Medications:  Current Facility-Administered Medications  Medication Dose Route Frequency Provider Last Rate Last Admin   acetaminophen (TYLENOL) tablet 650 mg  650 mg Oral Q6H PRN Ajibola, Ene A, NP       alum & mag hydroxide-simeth (MAALOX/MYLANTA) 200-200-20 MG/5ML suspension 30 mL  30 mL Oral Q4H PRN Ajibola, Ene A, NP       aspirin EC tablet 81 mg  81 mg Oral Daily Ajibola, Ene A, NP   81 mg at 06/12/22 0940   atorvastatin (LIPITOR) tablet 20 mg  20 mg Oral Daily Ajibola, Ene A, NP   20 mg at 06/12/22 0940   cholecalciferol (VITAMIN D3) tablet 1,000 Units  1,000 Units Oral Daily Ajibola, Ene A, NP   1,000 Units at 06/12/22 0940   cyanocobalamin (VITAMIN B12) tablet 1,000 mcg  1,000 mcg Oral Daily Ajibola, Ene A, NP   1,000 mcg at 06/12/22 0940   hydrochlorothiazide (HYDRODIURIL) tablet 12.5 mg  12.5 mg Oral Daily Ajibola, Ene A, NP   12.5 mg at 06/12/22 0940   hydrOXYzine (ATARAX) tablet 10 mg  10 mg Oral Q8H PRN Ajibola, Ene A, NP   10 mg at 06/11/22 2355   losartan (COZAAR) tablet 100 mg  100 mg Oral Daily Ajibola, Ene A, NP   100 mg at 06/12/22 0940   magnesium hydroxide (MILK OF MAGNESIA) suspension 30 mL  30 mL Oral Daily PRN Ajibola, Ene A, NP       metoprolol succinate (TOPROL-XL) 24 hr tablet 50 mg  50 mg Oral Daily Ajibola, Ene A, NP   50 mg at 06/12/22 0940   multivitamin with minerals tablet 1 tablet  1 tablet  Oral Daily Ajibola, Ene A, NP   1 tablet at 06/12/22 0940   pantoprazole (PROTONIX) EC tablet 40 mg  40 mg Oral Daily Ajibola, Ene A, NP   40 mg at 06/12/22 0940   traZODone (DESYREL) tablet 50 mg  50 mg Oral QHS PRN Ajibola, Ene A, NP   50 mg at 06/12/22 0143   venlafaxine XR (EFFEXOR-XR) 24 hr capsule 150 mg  150 mg Oral Q breakfast Ajibola, Ene A, NP   150 mg at 06/12/22 0941   Current Outpatient Medications  Medication Sig Dispense Refill   albuterol (VENTOLIN HFA) 108 (90 Base) MCG/ACT inhaler Inhale 2 puffs into the lungs every 6 (six) hours as needed for wheezing or shortness of breath. 8 g 2   aspirin EC 81 MG tablet Take 81 mg by mouth daily.     atorvastatin (LIPITOR) 20 MG tablet Take 1 tablet (20 mg total) by mouth daily. 90 tablet 1   cetirizine (ZYRTEC) 10 MG tablet Take 1 tablet (10 mg total) by mouth daily. 90 tablet 1   estradiol (ESTRACE) 0.1 MG/GM vaginal cream Discard plastic applicator. Insert blueberry size amount of cream on finger in vagina daily x1 week then 2x per week.     hydrochlorothiazide (MICROZIDE) 12.5 MG capsule Take 1 capsule (12.5 mg total) by mouth daily. 90 capsule 1   hydroxypropyl methylcellulose / hypromellose (ISOPTO TEARS / GONIOVISC) 2.5 % ophthalmic solution Place 2 drops into both eyes 3 (three) times daily as needed for dry eyes.     lidocaine-prilocaine (EMLA) cream Apply to affected area once (Patient taking differently: 1 Application as needed. Apply to affected area to port as needed) 30 g 3   metoprolol succinate (TOPROL-XL) 50 MG 24 hr tablet Take 1  tablet (50 mg total) by mouth daily. Take with or immediately following a meal. 90 tablet 1   Multiple Vitamin (MULTIVITAMIN) capsule Take 1 capsule by mouth daily.     omeprazole (PRILOSEC) 20 MG capsule Take 1 capsule (20 mg total) by mouth daily. 90 capsule 1   venlafaxine XR (EFFEXOR XR) 75 MG 24 hr capsule Take 2 capsules (150 mg total) by mouth daily with breakfast. (Patient taking differently:  Take 75 mg by mouth daily with breakfast.)     cyanocobalamin (VITAMIN B12) 1000 MCG tablet Take 1 tablet (1,000 mcg total) by mouth daily.     denosumab (PROLIA) 60 MG/ML SOSY injection Inject 60 mg into the skin every 6 (six) months.     fluticasone furoate-vilanterol (BREO ELLIPTA) 100-25 MCG/ACT AEPB Inhale 1 puff into the lungs daily. (Patient taking differently: Inhale 1 puff into the lungs daily as needed.) 60 each 5   glucose blood (ACCU-CHEK AVIVA PLUS) test strip Use to Monitor blood sugars daily. DX. E11.9 100 each 5   losartan (COZAAR) 100 MG tablet TAKE 1 TABLET BY MOUTH ONCE A DAY 90 tablet 1    PTA Medications: (Not in a hospital admission)      08/10/2020    9:35 AM 01/13/2020    3:59 PM 07/16/2019    9:36 AM  Depression screen PHQ 2/9  Decreased Interest 0 0 0  Down, Depressed, Hopeless 0 0 0  PHQ - 2 Score 0 0 0    Flowsheet Row ED from 06/10/2022 in [facility] Admission (Discharged) from 09/03/2020 in [place] No Risk No Risk       Musculoskeletal  Strength & Muscle Tone: within normal limits Gait & Station: normal Patient leans: N/A  Psychiatric Specialty Exam  Presentation  General Appearance:  Appropriate for Environment  Eye Contact: Fair  Speech: Clear and Coherent  Speech Volume: Normal  Handedness: Right   Mood and Affect  Mood: Dysphoric; Irritable  Affect: Congruent   Thought Process  Thought Processes: Coherent  Descriptions of Associations:Intact  Orientation:Full (Time, Place and Person)  Thought Content:Logical  Diagnosis of Schizophrenia or Schizoaffective disorder in past: No    Hallucinations:Hallucinations: None  Ideas of Reference:None  Suicidal Thoughts:Suicidal Thoughts: No  Homicidal Thoughts:Homicidal Thoughts: No   Sensorium  Memory: Immediate Fair  Judgment: Intact  Insight: Lacking   Executive Functions   Concentration: Fair  Attention Span: Fair  Recall: [employer] of Knowledge: Fair  Language: Fair   Psychomotor Activity  Psychomotor Activity: Psychomotor Activity: Normal   Assets  Assets: Communication Skills   Sleep  Sleep: Sleep: Fair   No data recorded  Physical Exam  Physical Exam HENT:     Head: Normocephalic.     Nose: Nose normal.  Eyes:     Conjunctiva/sclera: Conjunctivae normal.  Cardiovascular:     Rate and Rhythm: Normal rate.  Pulmonary:     Effort: Pulmonary effort is normal.  Musculoskeletal:        General: Normal range of motion.     Cervical back: Normal range of motion.  Neurological:     Mental Status: She is alert and oriented to person, place, and time.    Review of Systems  Constitutional: Negative.   HENT: Negative.    Eyes: Negative.   Respiratory: Negative.    Cardiovascular: Negative.   Gastrointestinal: Negative.   Genitourinary: Negative.   Musculoskeletal: Negative.   Neurological: Negative.   Endo/Heme/Allergies: Negative.  Blood pressure 120/69, pulse 72, temperature 98 F (36.7 C), resp. rate 18, SpO2 100 %. There is no height or weight on file to calculate BMI.  Disposition: Inpatient psychiatric treatment. Patient accepted to [facility]. Accepting provider is Dr. Franchot Mimes. Patient is under IVC.   Marsden Zaino L, NP 06/12/2022, 1:17 PM

## 2022-06-12 NOTE — ED Notes (Signed)
Patient  sleeping in no acute stress. RR even and unlabored .Environment secured .Will continue to monitor for safely.

## 2022-06-12 NOTE — ED Notes (Signed)
Transport was called to take patient to [facility] . Waiting on sheriff to call back.

## 2022-06-12 NOTE — ED Notes (Signed)
Pt refused dinner

## 2022-06-12 NOTE — ED Notes (Signed)
Patient  is resting in no acute stress. RR even and unlabored .Environment secured . Watching tv.Will continue to monitor for safely.

## 2022-06-13 DIAGNOSIS — F332 Major depressive disorder, recurrent severe without psychotic features: Secondary | ICD-10-CM | POA: Diagnosis not present

## 2022-06-13 MED ORDER — VITAMIN D 25 MCG (1000 UNIT) PO TABS
1000.0000 [IU] | ORAL_TABLET | Freq: Every day | ORAL | Status: DC
  Administered 2022-06-13 – 2022-06-14 (×2): 1000 [IU] via ORAL
  Filled 2022-06-13: qty 1

## 2022-06-13 MED ORDER — VITAMIN D3 25 MCG PO TABS: 1000.0000 [IU] | ORAL_TABLET | Freq: Every day | ORAL | Status: AC

## 2022-06-13 NOTE — ED Provider Notes (Signed)
Behavioral Health Progress Note  Date and Time: 06/13/2022 10:26 AM Name: Cathy [name] MRN:  [id number]  Subjective:  Cathy [name] 77 y.o., female patient presented to [facility] as a walk in under IVC on 06/12/2022 after inquiring about buying a gun from her neighbor to end her life. She was admitted to the continuous assessment unit while awaiting inpatient psychiatric bed availability.   Cathy [name], 77 y.o., female patient seen face to face by this provider and chart reviewed on 06/13/22.  Per chart review patient has a past psychiatric history of depression and anxiety.  During evaluation Cathy [name] is observed interacting with the RN on the unit.  She is in no acute distress.  She is alert/oriented x 4, calm, and cooperative.  She has normal speech and behavior.  She continues to endorse an increase in her depression and anxiety.  She has a dysphoric affect.  She is denying SI/HI/AVH.  Reports yesterday she was having a bad day and was joking with her neighbor who is a Engineer, structural about giving her a gun.  States, "I was not going to kill myself I was joking, I am paying the price for that now".  Per admission H&P patient admitted to having SI and wanting to end her life.  She does not appear to be responding to internal/external stimuli.  Patient continues to meet inpatient admission criteria.  She has been except to [facility].  However [facility] will not transport today.  Patient will remain on the unit with possible transportation in the a.m. on 06/14/2022.    Diagnosis:  Final diagnoses:  Severe episode of recurrent major depressive disorder, without psychotic features ([place])    Total Time spent with patient: 30 minutes  Past Psychiatric History: see H&P Past Medical History:  Past Medical History:  Diagnosis Date   Abnormal glucose    Allergy    Asthma    Allergy induced   Benign breast cyst in female    GERD (gastroesophageal reflux disease)    Hilar  lymphadenopathy 08/01/2011   History of nuclear stress test 2009   Treadmill and Stress Myoview- no CAD   Hyperlipidemia    Hypertension    Lymphadenopathy of left cervical region 08/01/2011   Nephrolithiasis 1984   Osteopenia    PONV (postoperative nausea and vomiting)    Post-menopause    Pre-diabetes    borderline   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    Grade 2   Vision changes     Past Surgical History:  Procedure Laterality Date   CHOLECYSTECTOMY     IR IMAGING GUIDED PORT INSERTION  09/21/2020   LITHOTRIPSY  1984   LYMPH NODE BIOPSY     MASS BIOPSY Left 09/03/2020   Procedure: LEFT NECK JUGULAR NODE;  Surgeon: Rozetta Nunnery, MD;  Location: [facility];  Service: ENT;  Laterality: Left;   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   Procedure: LEFT TOTAL KNEE ARTHROPLASTY;  Surgeon: Gaynelle Arabian, MD;  Location: [facility];  Service: Orthopedics;  Laterality: Left;   Family History:  Family History  Problem Relation Age of Onset   Hypertension Mother    Stroke Mother    Heart attack Mother        CABG, 5 Stents   Dementia Mother    Cancer Father        Kidney   Asthma Sister    Diabetes Brother        Borderline  Alcohol abuse Brother    Cancer Maternal Aunt        stomach ca   Cancer Maternal Aunt        ovarian ca   Breast cancer Cousin    Colon cancer Neg Hx    Family Psychiatric  History: see H&P Social History:  Social History   Substance and Sexual Activity  Alcohol Use No     Social History   Substance and Sexual Activity  Drug Use No    Social History   Socioeconomic History   Marital status: Widowed    Spouse name: Not on file   Number of children: Not on file   Years of education: Not on file   Highest education level: Not on file  Occupational History   Not on file  Tobacco Use   Smoking status: Never   Smokeless tobacco: Never  Substance and Sexual Activity   Alcohol use: No   Drug use: No   Sexual activity: Not  Currently    Birth control/protection: Post-menopausal  Other Topics Concern   Not on file  Social History Narrative   Lives alone.     Son is [name].     Social Determinants of Health   Financial Resource Strain: Not on file  Food Insecurity: Not on file  Transportation Needs: Not on file  Physical Activity: Not on file  Stress: Not on file  Social Connections: Not on file   SDOH:  SDOH Screenings   Alcohol Screen: Low Risk  (08/10/2020)  Depression (PHQ2-9): Low Risk  (08/10/2020)  Tobacco Use: Low Risk  (05/24/2022)   Additional Social History:    Pain Medications: Denies abuse Prescriptions: Denies abuse Over the Counter: Denies abuse History of alcohol / drug use?: No history of alcohol / drug abuse Longest period of sobriety (when/how long): NA                    Sleep: Fair  Appetite:  Good  Current Medications:  Current Facility-Administered Medications  Medication Dose Route Frequency Provider Last Rate Last Admin   acetaminophen (TYLENOL) tablet 650 mg  650 mg Oral Q6H PRN Ajibola, Ene A, NP       alum & mag hydroxide-simeth (MAALOX/MYLANTA) 200-200-20 MG/5ML suspension 30 mL  30 mL Oral Q4H PRN Ajibola, Ene A, NP       aspirin EC tablet 81 mg  81 mg Oral Daily Ajibola, Ene A, NP   81 mg at 06/13/22 0842   atorvastatin (LIPITOR) tablet 20 mg  20 mg Oral Daily Ajibola, Ene A, NP   20 mg at 06/13/22 0843   cholecalciferol (VITAMIN D3) 25 MCG (1000 UNIT) tablet 1,000 Units  1,000 Units Oral Daily Hampton Abbot, MD   1,000 Units at 06/13/22 0842   cyanocobalamin (VITAMIN B12) tablet 1,000 mcg  1,000 mcg Oral Daily Ajibola, Ene A, NP   1,000 mcg at 06/13/22 0842   hydrochlorothiazide (HYDRODIURIL) tablet 12.5 mg  12.5 mg Oral Daily Ajibola, Ene A, NP   12.5 mg at 06/13/22 0842   hydrOXYzine (ATARAX) tablet 10 mg  10 mg Oral Q8H PRN Ajibola, Ene A, NP   10 mg at 06/11/22 2355   losartan (COZAAR) tablet 100 mg  100 mg Oral Daily Ajibola, Ene A, NP    100 mg at 06/13/22 0842   magnesium hydroxide (MILK OF MAGNESIA) suspension 30 mL  30 mL Oral Daily PRN Ajibola, Ene A, NP  metoprolol succinate (TOPROL-XL) 24 hr tablet 50 mg  50 mg Oral Daily Ajibola, Ene A, NP   50 mg at 06/13/22 0842   multivitamin with minerals tablet 1 tablet  1 tablet Oral Daily Ajibola, Ene A, NP   1 tablet at 06/13/22 0842   pantoprazole (PROTONIX) EC tablet 40 mg  40 mg Oral Daily Ajibola, Ene A, NP   40 mg at 06/13/22 0842   traZODone (DESYREL) tablet 50 mg  50 mg Oral QHS PRN Ajibola, Ene A, NP   50 mg at 06/12/22 0143   venlafaxine XR (EFFEXOR-XR) 24 hr capsule 150 mg  150 mg Oral Q breakfast Ajibola, Ene A, NP   150 mg at 06/13/22 5409   Current Outpatient Medications  Medication Sig Dispense Refill   albuterol (VENTOLIN HFA) 108 (90 Base) MCG/ACT inhaler Inhale 2 puffs into the lungs every 6 (six) hours as needed for wheezing or shortness of breath. 8 g 2   aspirin EC 81 MG tablet Take 81 mg by mouth daily.     atorvastatin (LIPITOR) 20 MG tablet Take 1 tablet (20 mg total) by mouth daily. 90 tablet 1   cetirizine (ZYRTEC) 10 MG tablet Take 1 tablet (10 mg total) by mouth daily. 90 tablet 1   estradiol (ESTRACE) 0.1 MG/GM vaginal cream Discard plastic applicator. Insert blueberry size amount of cream on finger in vagina daily x1 week then 2x per week.     hydrochlorothiazide (MICROZIDE) 12.5 MG capsule Take 1 capsule (12.5 mg total) by mouth daily. 90 capsule 1   hydroxypropyl methylcellulose / hypromellose (ISOPTO TEARS / GONIOVISC) 2.5 % ophthalmic solution Place 2 drops into both eyes 3 (three) times daily as needed for dry eyes.     lidocaine-prilocaine (EMLA) cream Apply to affected area once (Patient taking differently: 1 Application as needed. Apply to affected area to port as needed) 30 g 3   metoprolol succinate (TOPROL-XL) 50 MG 24 hr tablet Take 1 tablet (50 mg total) by mouth daily. Take with or immediately following a meal. 90 tablet 1   Multiple  Vitamin (MULTIVITAMIN) capsule Take 1 capsule by mouth daily.     omeprazole (PRILOSEC) 20 MG capsule Take 1 capsule (20 mg total) by mouth daily. 90 capsule 1   venlafaxine XR (EFFEXOR XR) 75 MG 24 hr capsule Take 2 capsules (150 mg total) by mouth daily with breakfast. (Patient taking differently: Take 75 mg by mouth daily with breakfast.)     cyanocobalamin (VITAMIN B12) 1000 MCG tablet Take 1 tablet (1,000 mcg total) by mouth daily.     denosumab (PROLIA) 60 MG/ML SOSY injection Inject 60 mg into the skin every 6 (six) months.     fluticasone furoate-vilanterol (BREO ELLIPTA) 100-25 MCG/ACT AEPB Inhale 1 puff into the lungs daily. (Patient taking differently: Inhale 1 puff into the lungs daily as needed.) 60 each 5   glucose blood (ACCU-CHEK AVIVA PLUS) test strip Use to Monitor blood sugars daily. DX. E11.9 100 each 5   losartan (COZAAR) 100 MG tablet TAKE 1 TABLET BY MOUTH ONCE A DAY 90 tablet 1    Labs  Lab Results:  Admission on 06/10/2022  Component Date Value Ref Range Status   SARS Coronavirus 2 by RT PCR 06/10/2022 NEGATIVE  NEGATIVE Final   Comment: (NOTE) SARS-CoV-2 target nucleic acids are NOT DETECTED.  The SARS-CoV-2 RNA is generally detectable in upper respiratory specimens during the acute phase of infection. The lowest concentration of SARS-CoV-2 viral copies this assay can  detect is 138 copies/mL. A negative result does not preclude SARS-Cov-2 infection and should not be used as the sole basis for treatment or other patient management decisions. A negative result may occur with  improper specimen collection/handling, submission of specimen other than nasopharyngeal swab, presence of viral mutation(s) within the areas targeted by this assay, and inadequate number of viral copies(<138 copies/mL). A negative result must be combined with clinical observations, patient history, and epidemiological information. The expected result is Negative.  Fact Sheet for Patients:   EntrepreneurPulse.com.au  Fact Sheet for Healthcare Providers:  IncredibleEmployment.be  This test is no                          t yet approved or cleared by the [place] FDA and  has been authorized for detection and/or diagnosis of SARS-CoV-2 by FDA under an Emergency Use Authorization (EUA). This EUA will remain  in effect (meaning this test can be used) for the duration of the COVID-19 declaration under Section 564(b)(1) of the Act, 21 U.S.C.section 360bbb-3(b)(1), unless the authorization is terminated  or revoked sooner.       Influenza A by PCR 06/10/2022 NEGATIVE  NEGATIVE Final   Influenza B by PCR 06/10/2022 NEGATIVE  NEGATIVE Final   Comment: (NOTE) The Xpert Xpress SARS-CoV-2/FLU/RSV plus assay is intended as an aid in the diagnosis of influenza from Nasopharyngeal swab specimens and should not be used as a sole basis for treatment. Nasal washings and aspirates are unacceptable for Xpert Xpress SARS-CoV-2/FLU/RSV testing.  Fact Sheet for Patients: EntrepreneurPulse.com.au  Fact Sheet for Healthcare Providers: IncredibleEmployment.be  This test is not yet approved or cleared by the [place] FDA and has been authorized for detection and/or diagnosis of SARS-CoV-2 by FDA under an Emergency Use Authorization (EUA). This EUA will remain in effect (meaning this test can be used) for the duration of the COVID-19 declaration under Section 564(b)(1) of the Act, 21 U.S.C. section 360bbb-3(b)(1), unless the authorization is terminated or revoked.     Resp Syncytial Virus by PCR 06/10/2022 NEGATIVE  NEGATIVE Final   Comment: (NOTE) Fact Sheet for Patients: EntrepreneurPulse.com.au  Fact Sheet for Healthcare Providers: IncredibleEmployment.be  This test is not yet approved or cleared by the [place] FDA and has been authorized for detection and/or  diagnosis of SARS-CoV-2 by FDA under an Emergency Use Authorization (EUA). This EUA will remain in effect (meaning this test can be used) for the duration of the COVID-19 declaration under Section 564(b)(1) of the Act, 21 U.S.C. section 360bbb-3(b)(1), unless the authorization is terminated or revoked.  Performed at [facility], [place] [address], [place], [place] [address]    WBC 06/10/2022 8.6  4.0 - 10.5 K/uL Final   RBC 06/10/2022 5.44 (H)  3.87 - 5.11 MIL/uL Final   Hemoglobin 06/10/2022 16.3 (H)  12.0 - 15.0 g/dL Final   HCT 06/10/2022 48.3 (H)  36.0 - 46.0 % Final   MCV 06/10/2022 88.8  80.0 - 100.0 fL Final   MCH 06/10/2022 30.0  26.0 - 34.0 pg Final   MCHC 06/10/2022 33.7  30.0 - 36.0 g/dL Final   RDW 06/10/2022 13.2  11.5 - 15.5 % Final   Platelets 06/10/2022 248  150 - 400 K/uL Final   nRBC 06/10/2022 0.0  0.0 - 0.2 % Final   Neutrophils Relative % 06/10/2022 69  % Final   Neutro Abs 06/10/2022 6.0  1.7 - 7.7 K/uL Final   Lymphocytes  Relative 06/10/2022 20  % Final   Lymphs Abs 06/10/2022 1.7  0.7 - 4.0 K/uL Final   Monocytes Relative 06/10/2022 9  % Final   Monocytes Absolute 06/10/2022 0.7  0.1 - 1.0 K/uL Final   Eosinophils Relative 06/10/2022 1  % Final   Eosinophils Absolute 06/10/2022 0.1  0.0 - 0.5 K/uL Final   Basophils Relative 06/10/2022 1  % Final   Basophils Absolute 06/10/2022 0.0  0.0 - 0.1 K/uL Final   Immature Granulocytes 06/10/2022 0  % Final   Abs Immature Granulocytes 06/10/2022 0.03  0.00 - 0.07 K/uL Final   Performed at [facility] [address] [place], [place] [address]   Sodium 06/10/2022 140  135 - 145 mmol/L Final   Potassium 06/10/2022 4.2  3.5 - 5.1 mmol/L Final   Chloride 06/10/2022 105  98 - 111 mmol/L Final   CO2 06/10/2022 25  22 - 32 mmol/L Final   Glucose, Bld 06/10/2022 114 (H)  70 - 99 mg/dL Final   Glucose reference range applies only to samples taken after fasting for at least 8 hours.   BUN 06/10/2022 9  8 - 23  mg/dL Final   Creatinine, Ser 06/10/2022 0.70  0.44 - 1.00 mg/dL Final   Calcium 06/10/2022 9.2  8.9 - 10.3 mg/dL Final   Total Protein 06/10/2022 6.8  6.5 - 8.1 g/dL Final   Albumin 06/10/2022 4.6  3.5 - 5.0 g/dL Final   AST 06/10/2022 20  15 - 41 U/L Final   ALT 06/10/2022 15  0 - 44 U/L Final   Alkaline Phosphatase 06/10/2022 43  38 - 126 U/L Final   Total Bilirubin 06/10/2022 0.9  0.3 - 1.2 mg/dL Final   GFR, Estimated 06/10/2022 >60  >60 mL/min Final   Comment: (NOTE) Calculated using the CKD-EPI Creatinine Equation (2021)    Anion gap 06/10/2022 10  5 - 15 Final   Performed at [facility] [address], [place], [place] [address]   Hgb A1c MFr Bld 06/10/2022 5.5  4.8 - 5.6 % Final   Comment: (NOTE) Pre diabetes:          5.7%-6.4%  Diabetes:              >6.4%  Glycemic control for   <7.0% adults with diabetes    Mean Plasma Glucose 06/10/2022 111.15  mg/dL Final   Performed at [facility], [place] [address], [place], [place] [address]   Alcohol, Ethyl (B) 06/10/2022 <10  <10 mg/dL Final   Comment: (NOTE) Lowest detectable limit for serum alcohol is 10 mg/dL.  For medical purposes only. Performed at [facility], [place] [address], [place], [place] [address]    POC Amphetamine UR 06/10/2022 None Detected  NONE DETECTED (Cut Off Level 1000 ng/mL) Preliminary   POC Secobarbital (BAR) 06/10/2022 None Detected  NONE DETECTED (Cut Off Level 300 ng/mL) Preliminary   POC Buprenorphine (BUP) 06/10/2022 None Detected  NONE DETECTED (Cut Off Level 10 ng/mL) Preliminary   POC Oxazepam (BZO) 06/10/2022 None Detected  NONE DETECTED (Cut Off Level 300 ng/mL) Preliminary   POC Cocaine UR 06/10/2022 None Detected  NONE DETECTED (Cut Off Level 300 ng/mL) Preliminary   POC Methamphetamine UR 06/10/2022 None Detected  NONE DETECTED (Cut Off Level 1000 ng/mL) Preliminary   POC Morphine 06/10/2022 None Detected  NONE DETECTED (Cut Off Level 300 ng/mL) Preliminary   POC  Methadone UR 06/10/2022 None Detected  NONE DETECTED (Cut  Off Level 300 ng/mL) Preliminary   POC Oxycodone UR 06/10/2022 None Detected  NONE DETECTED (Cut Off Level 100 ng/mL) Preliminary   POC Marijuana UR 06/10/2022 None Detected  NONE DETECTED (Cut Off Level 50 ng/mL) Preliminary   Cholesterol 06/10/2022 246 (H)  0 - 200 mg/dL Final   Triglycerides 06/10/2022 94  <150 mg/dL Final   HDL 06/10/2022 57  >40 mg/dL Final   Total CHOL/HDL Ratio 06/10/2022 4.3  RATIO Final   VLDL 06/10/2022 19  0 - 40 mg/dL Final   LDL Cholesterol 06/10/2022 170 (H)  0 - 99 mg/dL Final   Comment:        Total Cholesterol/HDL:CHD Risk Coronary Heart Disease Risk Table                     Men   Women  1/2 Average Risk   3.4   3.3  Average Risk       5.0   4.4  2 X Average Risk   9.6   7.1  3 X Average Risk  23.4   11.0        Use the calculated Patient Ratio above and the CHD Risk Table to determine the patient's CHD Risk.        ATP III CLASSIFICATION (LDL):  <100     mg/dL   Optimal  100-129  mg/dL   Near or Above                    Optimal  130-159  mg/dL   Borderline  160-189  mg/dL   High  >190     mg/dL   Very High Performed at [facility] [address], [place], [place] [address]    TSH 06/10/2022 1.797  0.350 - 4.500 uIU/mL Final   Comment: Performed by a 3rd Generation assay with a functional sensitivity of <=0.01 uIU/mL. Performed at [facility], [place] [address], [place], [place] [address]   Infusion on 05/24/2022  Component Date Value Ref Range Status   WBC Count 05/24/2022 5.8  4.0 - 10.5 K/uL Final   RBC 05/24/2022 5.08  3.87 - 5.11 MIL/uL Final   Hemoglobin 05/24/2022 15.2 (H)  12.0 - 15.0 g/dL Final   HCT 05/24/2022 45.4  36.0 - 46.0 % Final   MCV 05/24/2022 89.4  80.0 - 100.0 fL Final   MCH 05/24/2022 29.9  26.0 - 34.0 pg Final   MCHC 05/24/2022 33.5  30.0 - 36.0 g/dL Final   RDW 05/24/2022 12.8  11.5 - 15.5 % Final   Platelet Count 05/24/2022 243  150 - 400 K/uL  Final   nRBC 05/24/2022 0.0  0.0 - 0.2 % Final   Neutrophils Relative % 05/24/2022 69  % Final   Neutro Abs 05/24/2022 4.0  1.7 - 7.7 K/uL Final   Lymphocytes Relative 05/24/2022 22  % Final   Lymphs Abs 05/24/2022 1.3  0.7 - 4.0 K/uL Final   Monocytes Relative 05/24/2022 8  % Final   Monocytes Absolute 05/24/2022 0.5  0.1 - 1.0 K/uL Final   Eosinophils Relative 05/24/2022 1  % Final   Eosinophils Absolute 05/24/2022 0.0  0.0 - 0.5 K/uL Final   Basophils Relative 05/24/2022 0  % Final   Basophils Absolute 05/24/2022 0.0  0.0 - 0.1 K/uL Final   Immature Granulocytes 05/24/2022 0  % Final   Abs Immature Granulocytes 05/24/2022 0.02  0.00 - 0.07 K/uL Final   Performed at  [facility], [place] [address] [place], [place] [address]   Sodium 05/24/2022 142  135 - 145 mmol/L Final   Potassium 05/24/2022 4.0  3.5 - 5.1 mmol/L Final   Chloride 05/24/2022 108  98 - 111 mmol/L Final   CO2 05/24/2022 28  22 - 32 mmol/L Final   Glucose, Bld 05/24/2022 99  70 - 99 mg/dL Final   Glucose reference range applies only to samples taken after fasting for at least 8 hours.   BUN 05/24/2022 13  8 - 23 mg/dL Final   Creatinine 05/24/2022 0.60  0.44 - 1.00 mg/dL Final   Calcium 05/24/2022 9.7  8.9 - 10.3 mg/dL Final   Total Protein 05/24/2022 6.4 (L)  6.5 - 8.1 g/dL Final   Albumin 05/24/2022 4.4  3.5 - 5.0 g/dL Final   AST 05/24/2022 15  15 - 41 U/L Final   ALT 05/24/2022 8  0 - 44 U/L Final   Alkaline Phosphatase 05/24/2022 36 (L)  38 - 126 U/L Final   Total Bilirubin 05/24/2022 0.4  0.3 - 1.2 mg/dL Final   GFR, Estimated 05/24/2022 >60  >60 mL/min Final   Comment: (NOTE) Calculated using the CKD-EPI Creatinine Equation (2021)    Anion gap 05/24/2022 6  5 - 15 Final   Performed at [facility], [place] [address], [place], [place] [address]  Office Visit on 02/17/2022  Component Date Value Ref Range Status   Vitamin B-12 02/17/2022 223  211 - 911 pg/mL Final    TSH 02/17/2022 1.02  0.35 - 5.50 uIU/mL Final  Infusion on 01/03/2022  Component Date Value Ref Range Status   LDH 01/03/2022 131  98 - 192 U/L Final   Performed at [facility], [place] [address] [place], [place] [address]   Sodium 01/03/2022 140  135 - 145 mmol/L Final   Potassium 01/03/2022 4.4  3.5 - 5.1 mmol/L Final   Chloride 01/03/2022 104  98 - 111 mmol/L Final   CO2 01/03/2022 31  22 - 32 mmol/L Final   Glucose, Bld 01/03/2022 109 (H)  70 - 99 mg/dL Final   Glucose reference range applies only to samples taken after fasting for at least 8 hours.   BUN 01/03/2022 13  8 - 23 mg/dL Final   Creatinine 01/03/2022 0.74  0.44 - 1.00 mg/dL Final   Calcium 01/03/2022 9.4  8.9 - 10.3 mg/dL Final   Total Protein 01/03/2022 6.6  6.5 - 8.1 g/dL Final   Albumin 01/03/2022 4.3  3.5 - 5.0 g/dL Final   AST 01/03/2022 16  15 - 41 U/L Final   ALT 01/03/2022 10  0 - 44 U/L Final   Alkaline Phosphatase 01/03/2022 36 (L)  38 - 126 U/L Final   Total Bilirubin 01/03/2022 0.6  0.3 - 1.2 mg/dL Final   GFR, Estimated 01/03/2022 >60  >60 mL/min Final   Comment: (NOTE) Calculated using the CKD-EPI Creatinine Equation (2021)    Anion gap 01/03/2022 5  5 - 15 Final   Performed at [facility], [place] [address] [place], [place] [address]   WBC Count 01/03/2022 7.7  4.0 - 10.5 K/uL Final   RBC 01/03/2022 4.66  3.87 - 5.11 MIL/uL Final   Hemoglobin 01/03/2022 14.0  12.0 - 15.0 g/dL Final   HCT 01/03/2022 42.5  36.0 - 46.0 % Final   MCV 01/03/2022 91.2  80.0 - 100.0 fL Final   MCH 01/03/2022 30.0  26.0 - 34.0 pg Final   MCHC 01/03/2022 32.9  30.0 - 36.0 g/dL Final   RDW 01/03/2022 12.8  11.5 - 15.5 % Final   Platelet Count 01/03/2022 213  150 - 400 K/uL Final   nRBC 01/03/2022 0.0  0.0 - 0.2 % Final   Neutrophils Relative % 01/03/2022 69  % Final   Neutro Abs 01/03/2022 5.4  1.7 - 7.7 K/uL Final   Lymphocytes Relative 01/03/2022 19  % Final   Lymphs Abs  01/03/2022 1.5  0.7 - 4.0 K/uL Final   Monocytes Relative 01/03/2022 8  % Final   Monocytes Absolute 01/03/2022 0.6  0.1 - 1.0 K/uL Final   Eosinophils Relative 01/03/2022 3  % Final   Eosinophils Absolute 01/03/2022 0.2  0.0 - 0.5 K/uL Final   Basophils Relative 01/03/2022 1  % Final   Basophils Absolute 01/03/2022 0.0  0.0 - 0.1 K/uL Final   Immature Granulocytes 01/03/2022 0  % Final   Abs Immature Granulocytes 01/03/2022 0.02  0.00 - 0.07 K/uL Final   Performed at [facility], [place] [address] [place], [place] [address]    Blood Alcohol level:  Lab Results  Component Value Date   ETH <10 86/57/8469    Metabolic Disorder Labs: Lab Results  Component Value Date   HGBA1C 5.5 06/10/2022   MPG 111.15 06/10/2022   MPG 105 04/05/2021   No results found for: "PROLACTIN" Lab Results  Component Value Date   CHOL 246 (H) 06/10/2022   TRIG 94 06/10/2022   HDL 57 06/10/2022   CHOLHDL 4.3 06/10/2022   VLDL 19 06/10/2022   LDLCALC 170 (H) 06/10/2022   LDLCALC 67 04/05/2021    Therapeutic Lab Levels: No results found for: "LITHIUM" No results found for: "VALPROATE" No results found for: "CBMZ"  Physical Findings   PHQ2-9    [place] Office Visit from 08/10/2020 in [facility] Office Visit from 01/13/2020 in [facility] Office Visit from 07/16/2019 in [facility] Office Visit from 04/08/2019 in [facility] Office Visit from 12/03/2018 in [facility]  PHQ-2 Total Score 0 0 0 0 0      [facility] from 06/10/2022 in [facility] Admission (Discharged) from 09/03/2020 in [facility] No Risk No Risk        Musculoskeletal  Strength & Muscle Tone: within normal limits Gait & Station: normal Patient leans: N/A  Psychiatric Specialty Exam  Presentation  General Appearance:  Appropriate for Environment;  Casual  Eye Contact: Good  Speech: Clear and Coherent; Normal Rate  Speech Volume: Normal  Handedness: Right   Mood and Affect  Mood: Dysphoric  Affect: Congruent   Thought Process  Thought Processes: Coherent  Descriptions of Associations:Intact  Orientation:Full (Time, Place and Person)  Thought Content:Logical  Diagnosis of Schizophrenia or Schizoaffective disorder in past: No    Hallucinations:Hallucinations: None  Ideas of Reference:None  Suicidal Thoughts:Suicidal Thoughts: No  Homicidal Thoughts:Homicidal Thoughts: No   Sensorium  Memory: Recent Good; Immediate Good; Remote Good  Judgment: Intact  Insight: Fair   Community education officer  Concentration: Good  Attention Span: Good  Recall: Good  Fund of Knowledge: Good  Language: Fair   Psychomotor Activity  Psychomotor Activity: Psychomotor Activity: Normal   Assets  Assets: Physical Health; Resilience; Social Support; Housing   Sleep  Sleep: Sleep: Fair   No data recorded  Physical Exam  Physical  Exam Vitals and nursing note reviewed.  Constitutional:      General: She is not in acute distress.    Appearance: Normal appearance. She is not ill-appearing.  HENT:     Head: Normocephalic.  Eyes:     General:        Right eye: No discharge.        Left eye: No discharge.     Conjunctiva/sclera: Conjunctivae normal.  Cardiovascular:     Rate and Rhythm: Normal rate.  Pulmonary:     Effort: Pulmonary effort is normal.  Musculoskeletal:        General: Normal range of motion.     Cervical back: Normal range of motion.  Skin:    Coloration: Skin is not jaundiced or pale.  Neurological:     Mental Status: She is alert and oriented to person, place, and time.  Psychiatric:        Attention and Perception: Attention and perception normal.        Mood and Affect: Mood is depressed (dysphoric).        Speech: Speech normal.        Behavior: Behavior is  cooperative.        Thought Content: Thought content normal.        Cognition and Memory: Cognition normal.        Judgment: Judgment normal.    Review of Systems  Constitutional: Negative.   HENT: Negative.    Eyes: Negative.   Respiratory: Negative.    Cardiovascular: Negative.   Genitourinary: Negative.   Musculoskeletal: Negative.   Skin: Negative.   Neurological: Negative.   Psychiatric/Behavioral:  Positive for depression (dysphoric).    Blood pressure 139/80, pulse 91, temperature 97.9 F (36.6 C), temperature source Oral, resp. rate 16, SpO2 97 %. There is no height or weight on file to calculate BMI.  Treatment Plan Summary:   Patient continues to meet criteria for inpatient psychiatric admission.  She will remain under involuntary commitment at this time.  She has been accepted to [facility].-However per [employer] patient will not be transferred today.  She will remain on the unit with possible transportation by [employer] in the a.m. on 06/14/2022.  Revonda Humphrey, NP 06/13/2022 10:26 AM

## 2022-06-13 NOTE — ED Notes (Signed)
Pt resting in bed. A&O x4, calm and cooperative. Denies current SI/HI/AVH. No signs of distress noted. Monitoring for safety. 

## 2022-06-13 NOTE — Progress Notes (Signed)
Received Cathy [name] this AM awake at the nurses station. Shortly thereafter she returned to her chair bed and was awaken without difficulty. Later she was compliant wityh her medications. She denied all of the psychiatric symptoms this AM including feeling suicidal.

## 2022-06-13 NOTE — ED Notes (Signed)
Patient observed/assessed in bed/chair resting quietly appearing in no distress and verbalizing no complaints at this time. Will continue to monitor.

## 2022-06-13 NOTE — Progress Notes (Signed)
Cathy [name]  remained in the milieu throughout the morning. She interacted with the staff at intervals and made phone calls. She is napping at the present time.

## 2022-06-13 NOTE — Progress Notes (Addendum)
ADDENDUM  Pt has been accepted to [facility] psych unit TODAY 06/14/22. SEE CSW NOTE TO FOLLOW.  CSW received phone call from [place] in admissions at [facility]. [place] inquired about pt's ETA for acceptance today. CSW informed [facility] staff that Cathy [name] has no transportation today due to the holiday. CSW confirmed with Morrie Sheldon that pt still has a bed for tomorrow 06/14/22.   [name], [place]  06/13/2022 1:11 PM

## 2022-06-14 ENCOUNTER — Encounter: Payer: Self-pay | Admitting: Psychiatry

## 2022-06-14 ENCOUNTER — Ambulatory Visit: Payer: Medicare PPO | Admitting: Psychiatry

## 2022-06-14 ENCOUNTER — Inpatient Hospital Stay
Admission: AD | Admit: 2022-06-14 | Discharge: 2022-07-27 | DRG: 885 | Disposition: A | Discharge status: Home / Self Care | Payer: Medicare PPO | Source: Intra-hospital | Attending: Psychiatry | Admitting: Psychiatry

## 2022-06-14 ENCOUNTER — Other Ambulatory Visit: Payer: Self-pay

## 2022-06-14 DIAGNOSIS — S32512A Fracture of superior rim of left pubis, initial encounter for closed fracture: Secondary | ICD-10-CM | POA: Diagnosis not present

## 2022-06-14 DIAGNOSIS — F411 Generalized anxiety disorder: Secondary | ICD-10-CM | POA: Diagnosis present

## 2022-06-14 DIAGNOSIS — F332 Major depressive disorder, recurrent severe without psychotic features: Secondary | ICD-10-CM | POA: Diagnosis not present

## 2022-06-14 DIAGNOSIS — Z79899 Other long term (current) drug therapy: Secondary | ICD-10-CM | POA: Diagnosis not present

## 2022-06-14 DIAGNOSIS — W19XXXA Unspecified fall, initial encounter: Secondary | ICD-10-CM | POA: Diagnosis present

## 2022-06-14 DIAGNOSIS — S32592A Other specified fracture of left pubis, initial encounter for closed fracture: Secondary | ICD-10-CM | POA: Diagnosis present

## 2022-06-14 DIAGNOSIS — F5000 Anorexia nervosa, unspecified: Secondary | ICD-10-CM | POA: Diagnosis not present

## 2022-06-14 DIAGNOSIS — F333 Major depressive disorder, recurrent, severe with psychotic symptoms: Principal | ICD-10-CM | POA: Diagnosis present

## 2022-06-14 DIAGNOSIS — Z96652 Presence of left artificial knee joint: Secondary | ICD-10-CM | POA: Diagnosis present

## 2022-06-14 DIAGNOSIS — R45851 Suicidal ideations: Secondary | ICD-10-CM | POA: Diagnosis present

## 2022-06-14 DIAGNOSIS — G4700 Insomnia, unspecified: Secondary | ICD-10-CM | POA: Diagnosis present

## 2022-06-14 DIAGNOSIS — Z20822 Contact with and (suspected) exposure to covid-19: Secondary | ICD-10-CM | POA: Diagnosis not present

## 2022-06-14 DIAGNOSIS — M25552 Pain in left hip: Secondary | ICD-10-CM | POA: Diagnosis not present

## 2022-06-14 DIAGNOSIS — N390 Urinary tract infection, site not specified: Secondary | ICD-10-CM | POA: Diagnosis present

## 2022-06-14 DIAGNOSIS — R519 Headache, unspecified: Secondary | ICD-10-CM | POA: Diagnosis not present

## 2022-06-14 DIAGNOSIS — R296 Repeated falls: Secondary | ICD-10-CM | POA: Diagnosis present

## 2022-06-14 DIAGNOSIS — R4182 Altered mental status, unspecified: Secondary | ICD-10-CM | POA: Diagnosis not present

## 2022-06-14 DIAGNOSIS — Z8572 Personal history of non-Hodgkin lymphomas: Secondary | ICD-10-CM | POA: Diagnosis not present

## 2022-06-14 DIAGNOSIS — Z7982 Long term (current) use of aspirin: Secondary | ICD-10-CM

## 2022-06-14 DIAGNOSIS — M25551 Pain in right hip: Secondary | ICD-10-CM | POA: Diagnosis not present

## 2022-06-14 DIAGNOSIS — Z9181 History of falling: Secondary | ICD-10-CM | POA: Diagnosis not present

## 2022-06-14 DIAGNOSIS — S329XXA Fracture of unspecified parts of lumbosacral spine and pelvis, initial encounter for closed fracture: Secondary | ICD-10-CM | POA: Insufficient documentation

## 2022-06-14 LAB — POC SARS CORONAVIRUS 2 AG: SARSCOV2ONAVIRUS 2 AG: NEGATIVE

## 2022-06-14 MED ORDER — LOSARTAN POTASSIUM 25 MG PO TABS
100.0000 mg | ORAL_TABLET | Freq: Every day | ORAL | Status: DC
  Administered 2022-06-15: 100 mg via ORAL
  Filled 2022-06-14: qty 4

## 2022-06-14 MED ORDER — VITAMIN B-12 1000 MCG PO TABS
1000.0000 ug | ORAL_TABLET | Freq: Every day | ORAL | Status: DC
  Administered 2022-06-15 – 2022-07-27 (×43): 1000 ug via ORAL
  Filled 2022-06-14 (×43): qty 1

## 2022-06-14 MED ORDER — ATORVASTATIN CALCIUM 10 MG PO TABS
20.0000 mg | ORAL_TABLET | Freq: Every day | ORAL | Status: DC
  Administered 2022-06-15 – 2022-07-27 (×43): 20 mg via ORAL
  Filled 2022-06-14 (×43): qty 2

## 2022-06-14 MED ORDER — MAGNESIUM HYDROXIDE 400 MG/5ML PO SUSP
30.0000 mL | Freq: Every day | ORAL | Status: DC | PRN
  Administered 2022-06-15 – 2022-06-24 (×2): 30 mL via ORAL
  Filled 2022-06-14 (×3): qty 30

## 2022-06-14 MED ORDER — TRAZODONE HCL 50 MG PO TABS
50.0000 mg | ORAL_TABLET | Freq: Every evening | ORAL | Status: DC | PRN
  Administered 2022-06-14 – 2022-06-27 (×5): 50 mg via ORAL
  Filled 2022-06-14 (×5): qty 1

## 2022-06-14 MED ORDER — VENLAFAXINE HCL ER 75 MG PO CP24
150.0000 mg | ORAL_CAPSULE | Freq: Every day | ORAL | Status: DC
  Administered 2022-06-15 – 2022-06-25 (×11): 150 mg via ORAL
  Filled 2022-06-14 (×11): qty 2

## 2022-06-14 MED ORDER — ADULT MULTIVITAMIN W/MINERALS CH
1.0000 | ORAL_TABLET | Freq: Every day | ORAL | Status: DC
  Administered 2022-06-15 – 2022-07-27 (×43): 1 via ORAL
  Filled 2022-06-14 (×43): qty 1

## 2022-06-14 MED ORDER — METOPROLOL SUCCINATE ER 25 MG PO TB24
50.0000 mg | ORAL_TABLET | Freq: Every day | ORAL | Status: DC
  Filled 2022-06-14: qty 2

## 2022-06-14 MED ORDER — VITAMIN D 25 MCG (1000 UNIT) PO TABS
1000.0000 [IU] | ORAL_TABLET | Freq: Every day | ORAL | Status: DC
  Administered 2022-06-15 – 2022-07-27 (×43): 1000 [IU] via ORAL
  Filled 2022-06-14 (×43): qty 1

## 2022-06-14 MED ORDER — ALUM & MAG HYDROXIDE-SIMETH 200-200-20 MG/5ML PO SUSP
30.0000 mL | ORAL | Status: DC | PRN
  Administered 2022-07-24: 30 mL via ORAL
  Filled 2022-06-14 (×2): qty 30

## 2022-06-14 MED ORDER — HYDROCHLOROTHIAZIDE 25 MG PO TABS
12.5000 mg | ORAL_TABLET | Freq: Every day | ORAL | Status: DC
  Filled 2022-06-14: qty 1

## 2022-06-14 MED ORDER — PANTOPRAZOLE SODIUM 40 MG PO TBEC
40.0000 mg | DELAYED_RELEASE_TABLET | Freq: Every day | ORAL | Status: DC
  Administered 2022-06-15 – 2022-07-27 (×43): 40 mg via ORAL
  Filled 2022-06-14 (×43): qty 1

## 2022-06-14 MED ORDER — ASPIRIN 81 MG PO TBEC
81.0000 mg | DELAYED_RELEASE_TABLET | Freq: Every day | ORAL | Status: DC
  Administered 2022-06-15 – 2022-07-27 (×43): 81 mg via ORAL
  Filled 2022-06-14 (×43): qty 1

## 2022-06-14 MED ORDER — ACETAMINOPHEN 325 MG PO TABS
650.0000 mg | ORAL_TABLET | Freq: Four times a day (QID) | ORAL | Status: DC | PRN
  Administered 2022-06-20 – 2022-07-02 (×3): 650 mg via ORAL
  Filled 2022-06-14 (×4): qty 2

## 2022-06-14 MED ORDER — HYDROXYZINE HCL 10 MG PO TABS
10.0000 mg | ORAL_TABLET | Freq: Three times a day (TID) | ORAL | Status: DC | PRN
  Administered 2022-06-14 – 2022-06-20 (×3): 10 mg via ORAL
  Filled 2022-06-14 (×4): qty 1

## 2022-06-14 NOTE — ED Notes (Signed)
Rn Regulatory affairs officer at [facility] and she states that she will be ready for patient at 12 noon. Rn notified [place] that facility would not be ready until around 12 noon for the patient .

## 2022-06-14 NOTE — ED Notes (Signed)
Pt asleep in bed. Respirations even and unlabored. Monitoring for safety. 

## 2022-06-14 NOTE — Progress Notes (Signed)
Pt was accepted to [facility] 06/14/2022, pending negative covid test  Pt meets inpatient criteria per Thomes Lolling, NP  Attending Physician will be Caren Griffins, DO  Report can be called to: [phone]  Pt can arrive after 12 PM  Care Team Notified: Alethia Berthold, MD, Caren Griffins, DO, Thomes Lolling, NP, Mardene Sayer, RN, and Maxie Better, RN  [place], [place]  06/14/2022 10:01 AM

## 2022-06-14 NOTE — ED Notes (Signed)
Pt discharged to [facility] left with [place].   Pt alert, oriented, and ambulatory.  Safety maintained.

## 2022-06-14 NOTE — Plan of Care (Signed)
  Problem: Activity: Goal: Risk for activity intolerance will decrease Outcome: Progressing   Problem: Coping: Goal: Level of anxiety will decrease Outcome: Progressing   Problem: Pain Managment: Goal: General experience of comfort will improve Outcome: Progressing   Problem: Activity: Goal: Interest or engagement in leisure activities will improve Outcome: Progressing   Problem: Health Behavior/Discharge Planning: Goal: Compliance with therapeutic regimen will improve Outcome: Progressing   Problem: Coping: Goal: Will verbalize feelings Outcome: Not Progressing   Problem: Safety: Goal: Ability to disclose and discuss suicidal ideas will improve Outcome: Not Progressing

## 2022-06-14 NOTE — Progress Notes (Signed)
Admission Note:   77  yr female who presents IVC in no acute distress for the treatment of SI and Depression. Pt appears pleasant and depressed. Pt was calm and cooperative with admission process. Pt denies SI, HI, and  and contracts for safety upon admission. Pt says" I said something stupid to my neighbor because I was having a bad day. I had car problems and roof problems and it got the best of me". Pt has Past medical Depression and insomnia. Skin was assessed and found to be clear of any abnormal marks.Patient states that she would like to get better so that she can go home. PT searched and no contraband found, POC and unit policies explained and understanding verbalized. Consents obtained. Food and fluids offered, and fluids accepted. Pt had no additional questions or concerns

## 2022-06-14 NOTE — ED Provider Notes (Signed)
Call contact to Yulanda Diggs (son) [phone] with patients permission. Informed that patient will be transferred to [facility] today for IP admission.

## 2022-06-14 NOTE — ED Notes (Signed)
[facility] just  call stating they will pick pt up sometime this morinng.

## 2022-06-14 NOTE — ED Notes (Signed)
Voicemail left to schedule sheriff transport to [facility] today.

## 2022-06-14 NOTE — ED Notes (Signed)
Rn notified [facility] that Patient would be arriving today once transport comes. Spoke with [place]. States that they are aware and to call report once transport gets here.

## 2022-06-14 NOTE — ED Notes (Signed)
Patient alert and oriented x 3. Denies SI/HI/AVH. Denies intent or plan to harm self or others. Routine conducted according to faculty protocol. Encourage patient to notify staff with any needs or concerns. Patient verbalized agreement and understanding. Will continue to monitor for safety.

## 2022-06-14 NOTE — ED Provider Notes (Addendum)
Behavioral Health Progress Note  Date and Time: 06/14/2022 8:40 AM Name: Cathy [name] MRN:  [id number]  Subjective:  Venita Lick 77 y.o., female patient presented to [facility] as a walk in under IVC on 06/12/2022 after inquiring about buying a gun from her neighbor to end her life. She was admitted to the continuous assessment unit while awaiting inpatient psychiatric bed availability.    Venita Lick, 77 y.o., female patient seen face to face by this provider and chart reviewed on 06/14/22.  Per chart review patient has a past psychiatric history of depression and anxiety.  During evaluation WILLAMINA GRIESHOP is observed sitting on the edge of her bed. She appears anxious. She is in no acute distress.She  is alert, oriented x 4 and cooperative. She continues to endorse depression. States she did not sleep well last night and she admits to being a little confused this morning when she first woke up. She continues to deny SI/HI/AVH.  Objectively she does not appear to be responding to internal/external stimuli.   Patient answered question appropriately.    Patient continues to be in agreement for IP admission. Even though she doesn't think she needs it. States this whole situation has been a big misunderstanding. States she did not have any intent on hurting herself.      Diagnosis:  Final diagnoses:  Severe episode of recurrent major depressive disorder, without psychotic features ([place])    Total Time spent with patient: 30 minutes  Past Psychiatric History: see H&P Past Medical History:  Past Medical History:  Diagnosis Date   Abnormal glucose    Allergy    Asthma    Allergy induced   Benign breast cyst in female    GERD (gastroesophageal reflux disease)    Hilar lymphadenopathy 08/01/2011   History of nuclear stress test 2009   Treadmill and Stress Myoview- no CAD   Hyperlipidemia    Hypertension    Lymphadenopathy of left cervical region 08/01/2011    Nephrolithiasis 1984   Osteopenia    PONV (postoperative nausea and vomiting)    Post-menopause    Pre-diabetes    borderline   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    Grade 2   Vision changes     Past Surgical History:  Procedure Laterality Date   CHOLECYSTECTOMY     IR IMAGING GUIDED PORT INSERTION  09/21/2020   LITHOTRIPSY  1984   LYMPH NODE BIOPSY     MASS BIOPSY Left 09/03/2020   Procedure: LEFT NECK JUGULAR NODE;  Surgeon: Rozetta Nunnery, MD;  Location: [place];  Service: ENT;  Laterality: Left;   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   Procedure: LEFT TOTAL KNEE ARTHROPLASTY;  Surgeon: Gaynelle Arabian, MD;  Location: [facility];  Service: Orthopedics;  Laterality: Left;   Family History:  Family History  Problem Relation Age of Onset   Hypertension Mother    Stroke Mother    Heart attack Mother        CABG, 5 Stents   Dementia Mother    Cancer Father        Kidney   Asthma Sister    Diabetes Brother        Borderline   Alcohol abuse Brother    Cancer Maternal Aunt        stomach ca   Cancer Maternal Aunt        ovarian ca   Breast cancer Cousin    Colon cancer Neg Hx  Family Psychiatric  History: see H&P Social History:  Social History   Substance and Sexual Activity  Alcohol Use No     Social History   Substance and Sexual Activity  Drug Use No    Social History   Socioeconomic History   Marital status: Widowed    Spouse name: Not on file   Number of children: Not on file   Years of education: Not on file   Highest education level: Not on file  Occupational History   Not on file  Tobacco Use   Smoking status: Never   Smokeless tobacco: Never  Substance and Sexual Activity   Alcohol use: No   Drug use: No   Sexual activity: Not Currently    Birth control/protection: Post-menopausal  Other Topics Concern   Not on file  Social History Narrative   Lives alone.     Son is [name].     Social Determinants of  Health   Financial Resource Strain: Not on file  Food Insecurity: Not on file  Transportation Needs: Not on file  Physical Activity: Not on file  Stress: Not on file  Social Connections: Not on file   SDOH:  SDOH Screenings   Alcohol Screen: Low Risk  (08/10/2020)  Depression (PHQ2-9): Low Risk  (08/10/2020)  Tobacco Use: Low Risk  (05/24/2022)   Additional Social History:    Pain Medications: Denies abuse Prescriptions: Denies abuse Over the Counter: Denies abuse History of alcohol / drug use?: No history of alcohol / drug abuse Longest period of sobriety (when/how long): NA         Sleep: Poor  Appetite:  Fair  Current Medications:  Current Facility-Administered Medications  Medication Dose Route Frequency Provider Last Rate Last Admin   acetaminophen (TYLENOL) tablet 650 mg  650 mg Oral Q6H PRN Ajibola, Ene A, NP       alum & mag hydroxide-simeth (MAALOX/MYLANTA) 200-200-20 MG/5ML suspension 30 mL  30 mL Oral Q4H PRN Ajibola, Ene A, NP       aspirin EC tablet 81 mg  81 mg Oral Daily Ajibola, Ene A, NP   81 mg at 06/13/22 0842   atorvastatin (LIPITOR) tablet 20 mg  20 mg Oral Daily Ajibola, Ene A, NP   20 mg at 06/13/22 0843   cholecalciferol (VITAMIN D3) 25 MCG (1000 UNIT) tablet 1,000 Units  1,000 Units Oral Daily Hampton Abbot, MD   1,000 Units at 06/13/22 0842   cyanocobalamin (VITAMIN B12) tablet 1,000 mcg  1,000 mcg Oral Daily Ajibola, Ene A, NP   1,000 mcg at 06/13/22 0842   hydrochlorothiazide (HYDRODIURIL) tablet 12.5 mg  12.5 mg Oral Daily Ajibola, Ene A, NP   12.5 mg at 06/13/22 0842   hydrOXYzine (ATARAX) tablet 10 mg  10 mg Oral Q8H PRN Ajibola, Ene A, NP   10 mg at 06/11/22 2355   losartan (COZAAR) tablet 100 mg  100 mg Oral Daily Ajibola, Ene A, NP   100 mg at 06/13/22 0842   magnesium hydroxide (MILK OF MAGNESIA) suspension 30 mL  30 mL Oral Daily PRN Ajibola, Ene A, NP       metoprolol succinate (TOPROL-XL) 24 hr tablet 50 mg  50 mg Oral Daily Ajibola,  Ene A, NP   50 mg at 06/13/22 0842   multivitamin with minerals tablet 1 tablet  1 tablet Oral Daily Ajibola, Ene A, NP   1 tablet at 06/13/22 0842   pantoprazole (PROTONIX) EC tablet 40 mg  40 mg Oral Daily Ajibola, Ene A, NP   40 mg at 06/13/22 0842   traZODone (DESYREL) tablet 50 mg  50 mg Oral QHS PRN Ajibola, Ene A, NP   50 mg at 06/12/22 0143   venlafaxine XR (EFFEXOR-XR) 24 hr capsule 150 mg  150 mg Oral Q breakfast Ajibola, Ene A, NP   150 mg at 06/14/22 0800   Current Outpatient Medications  Medication Sig Dispense Refill   albuterol (VENTOLIN HFA) 108 (90 Base) MCG/ACT inhaler Inhale 2 puffs into the lungs every 6 (six) hours as needed for wheezing or shortness of breath. 8 g 2   aspirin EC 81 MG tablet Take 81 mg by mouth daily.     atorvastatin (LIPITOR) 20 MG tablet Take 1 tablet (20 mg total) by mouth daily. 90 tablet 1   cetirizine (ZYRTEC) 10 MG tablet Take 1 tablet (10 mg total) by mouth daily. 90 tablet 1   estradiol (ESTRACE) 0.1 MG/GM vaginal cream Discard plastic applicator. Insert blueberry size amount of cream on finger in vagina daily x1 week then 2x per week.     hydrochlorothiazide (MICROZIDE) 12.5 MG capsule Take 1 capsule (12.5 mg total) by mouth daily. 90 capsule 1   hydroxypropyl methylcellulose / hypromellose (ISOPTO TEARS / GONIOVISC) 2.5 % ophthalmic solution Place 2 drops into both eyes 3 (three) times daily as needed for dry eyes.     lidocaine-prilocaine (EMLA) cream Apply to affected area once (Patient taking differently: 1 Application as needed. Apply to affected area to port as needed) 30 g 3   metoprolol succinate (TOPROL-XL) 50 MG 24 hr tablet Take 1 tablet (50 mg total) by mouth daily. Take with or immediately following a meal. 90 tablet 1   Multiple Vitamin (MULTIVITAMIN) capsule Take 1 capsule by mouth daily.     omeprazole (PRILOSEC) 20 MG capsule Take 1 capsule (20 mg total) by mouth daily. 90 capsule 1   venlafaxine XR (EFFEXOR XR) 75 MG 24 hr capsule  Take 2 capsules (150 mg total) by mouth daily with breakfast. (Patient taking differently: Take 75 mg by mouth daily with breakfast.)     cholecalciferol (CHOLECALCIFEROL) 25 MCG tablet Take 1 tablet (1,000 Units total) by mouth daily.     cyanocobalamin (VITAMIN B12) 1000 MCG tablet Take 1 tablet (1,000 mcg total) by mouth daily.     denosumab (PROLIA) 60 MG/ML SOSY injection Inject 60 mg into the skin every 6 (six) months.     fluticasone furoate-vilanterol (BREO ELLIPTA) 100-25 MCG/ACT AEPB Inhale 1 puff into the lungs daily. (Patient taking differently: Inhale 1 puff into the lungs daily as needed.) 60 each 5   glucose blood (ACCU-CHEK AVIVA PLUS) test strip Use to Monitor blood sugars daily. DX. E11.9 100 each 5   losartan (COZAAR) 100 MG tablet TAKE 1 TABLET BY MOUTH ONCE A DAY 90 tablet 1    Labs  Lab Results:  Admission on 06/10/2022  Component Date Value Ref Range Status   SARS Coronavirus 2 by RT PCR 06/10/2022 NEGATIVE  NEGATIVE Final   Comment: (NOTE) SARS-CoV-2 target nucleic acids are NOT DETECTED.  The SARS-CoV-2 RNA is generally detectable in upper respiratory specimens during the acute phase of infection. The lowest concentration of SARS-CoV-2 viral copies this assay can detect is 138 copies/mL. A negative result does not preclude SARS-Cov-2 infection and should not be used as the sole basis for treatment or other patient management decisions. A negative result may occur with  improper specimen collection/handling, submission  of specimen other than nasopharyngeal swab, presence of viral mutation(s) within the areas targeted by this assay, and inadequate number of viral copies(<138 copies/mL). A negative result must be combined with clinical observations, patient history, and epidemiological information. The expected result is Negative.  Fact Sheet for Patients:  EntrepreneurPulse.com.au  Fact Sheet for Healthcare Providers:   IncredibleEmployment.be  This test is no                          t yet approved or cleared by the [place] FDA and  has been authorized for detection and/or diagnosis of SARS-CoV-2 by FDA under an Emergency Use Authorization (EUA). This EUA will remain  in effect (meaning this test can be used) for the duration of the COVID-19 declaration under Section 564(b)(1) of the Act, 21 U.S.C.section 360bbb-3(b)(1), unless the authorization is terminated  or revoked sooner.       Influenza A by PCR 06/10/2022 NEGATIVE  NEGATIVE Final   Influenza B by PCR 06/10/2022 NEGATIVE  NEGATIVE Final   Comment: (NOTE) The Xpert Xpress SARS-CoV-2/FLU/RSV plus assay is intended as an aid in the diagnosis of influenza from Nasopharyngeal swab specimens and should not be used as a sole basis for treatment. Nasal washings and aspirates are unacceptable for Xpert Xpress SARS-CoV-2/FLU/RSV testing.  Fact Sheet for Patients: EntrepreneurPulse.com.au  Fact Sheet for Healthcare Providers: IncredibleEmployment.be  This test is not yet approved or cleared by the [place] FDA and has been authorized for detection and/or diagnosis of SARS-CoV-2 by FDA under an Emergency Use Authorization (EUA). This EUA will remain in effect (meaning this test can be used) for the duration of the COVID-19 declaration under Section 564(b)(1) of the Act, 21 U.S.C. section 360bbb-3(b)(1), unless the authorization is terminated or revoked.     Resp Syncytial Virus by PCR 06/10/2022 NEGATIVE  NEGATIVE Final   Comment: (NOTE) Fact Sheet for Patients: EntrepreneurPulse.com.au  Fact Sheet for Healthcare Providers: IncredibleEmployment.be  This test is not yet approved or cleared by the [place] FDA and has been authorized for detection and/or diagnosis of SARS-CoV-2 by FDA under an Emergency Use Authorization (EUA). This EUA  will remain in effect (meaning this test can be used) for the duration of the COVID-19 declaration under Section 564(b)(1) of the Act, 21 U.S.C. section 360bbb-3(b)(1), unless the authorization is terminated or revoked.  Performed at [facility], [place] [address], [place], [place] [address]    WBC 06/10/2022 8.6  4.0 - 10.5 K/uL Final   RBC 06/10/2022 5.44 (H)  3.87 - 5.11 MIL/uL Final   Hemoglobin 06/10/2022 16.3 (H)  12.0 - 15.0 g/dL Final   HCT 06/10/2022 48.3 (H)  36.0 - 46.0 % Final   MCV 06/10/2022 88.8  80.0 - 100.0 fL Final   MCH 06/10/2022 30.0  26.0 - 34.0 pg Final   MCHC 06/10/2022 33.7  30.0 - 36.0 g/dL Final   RDW 06/10/2022 13.2  11.5 - 15.5 % Final   Platelets 06/10/2022 248  150 - 400 K/uL Final   nRBC 06/10/2022 0.0  0.0 - 0.2 % Final   Neutrophils Relative % 06/10/2022 69  % Final   Neutro Abs 06/10/2022 6.0  1.7 - 7.7 K/uL Final   Lymphocytes Relative 06/10/2022 20  % Final   Lymphs Abs 06/10/2022 1.7  0.7 - 4.0 K/uL Final   Monocytes Relative 06/10/2022 9  % Final   Monocytes Absolute 06/10/2022 0.7  0.1 - 1.0 K/uL Final  Eosinophils Relative 06/10/2022 1  % Final   Eosinophils Absolute 06/10/2022 0.1  0.0 - 0.5 K/uL Final   Basophils Relative 06/10/2022 1  % Final   Basophils Absolute 06/10/2022 0.0  0.0 - 0.1 K/uL Final   Immature Granulocytes 06/10/2022 0  % Final   Abs Immature Granulocytes 06/10/2022 0.03  0.00 - 0.07 K/uL Final   Performed at [facility], [place] [address], [place], [place] [address]   Sodium 06/10/2022 140  135 - 145 mmol/L Final   Potassium 06/10/2022 4.2  3.5 - 5.1 mmol/L Final   Chloride 06/10/2022 105  98 - 111 mmol/L Final   CO2 06/10/2022 25  22 - 32 mmol/L Final   Glucose, Bld 06/10/2022 114 (H)  70 - 99 mg/dL Final   Glucose reference range applies only to samples taken after fasting for at least 8 hours.   BUN 06/10/2022 9  8 - 23 mg/dL Final   Creatinine, Ser 06/10/2022 0.70  0.44 - 1.00 mg/dL Final   Calcium  06/10/2022 9.2  8.9 - 10.3 mg/dL Final   Total Protein 06/10/2022 6.8  6.5 - 8.1 g/dL Final   Albumin 06/10/2022 4.6  3.5 - 5.0 g/dL Final   AST 06/10/2022 20  15 - 41 U/L Final   ALT 06/10/2022 15  0 - 44 U/L Final   Alkaline Phosphatase 06/10/2022 43  38 - 126 U/L Final   Total Bilirubin 06/10/2022 0.9  0.3 - 1.2 mg/dL Final   GFR, Estimated 06/10/2022 >60  >60 mL/min Final   Comment: (NOTE) Calculated using the CKD-EPI Creatinine Equation (2021)    Anion gap 06/10/2022 10  5 - 15 Final   Performed at [place] [address] [place], [place] [address]   Hgb A1c MFr Bld 06/10/2022 5.5  4.8 - 5.6 % Final   Comment: (NOTE) Pre diabetes:          5.7%-6.4%  Diabetes:              >6.4%  Glycemic control for   <7.0% adults with diabetes    Mean Plasma Glucose 06/10/2022 111.15  mg/dL Final   Performed at [facility], [place] [address], [place], [place] [address]   Alcohol, Ethyl (B) 06/10/2022 <10  <10 mg/dL Final   Comment: (NOTE) Lowest detectable limit for serum alcohol is 10 mg/dL.  For medical purposes only. Performed at [facility], [place] [address] [place], [place] [address]    POC Amphetamine UR 06/10/2022 None Detected  NONE DETECTED (Cut Off Level 1000 ng/mL) Preliminary   POC Secobarbital (BAR) 06/10/2022 None Detected  NONE DETECTED (Cut Off Level 300 ng/mL) Preliminary   POC Buprenorphine (BUP) 06/10/2022 None Detected  NONE DETECTED (Cut Off Level 10 ng/mL) Preliminary   POC Oxazepam (BZO) 06/10/2022 None Detected  NONE DETECTED (Cut Off Level 300 ng/mL) Preliminary   POC Cocaine UR 06/10/2022 None Detected  NONE DETECTED (Cut Off Level 300 ng/mL) Preliminary   POC Methamphetamine UR 06/10/2022 None Detected  NONE DETECTED (Cut Off Level 1000 ng/mL) Preliminary   POC Morphine 06/10/2022 None Detected  NONE DETECTED (Cut Off Level 300 ng/mL) Preliminary   POC Methadone UR 06/10/2022 None Detected  NONE DETECTED (Cut Off Level 300 ng/mL)  Preliminary   POC Oxycodone UR 06/10/2022 None Detected  NONE DETECTED (Cut Off Level 100 ng/mL) Preliminary   POC Marijuana UR 06/10/2022 None Detected  NONE DETECTED (Cut Off Level 50 ng/mL) Preliminary  Cholesterol 06/10/2022 246 (H)  0 - 200 mg/dL Final   Triglycerides 06/10/2022 94  <150 mg/dL Final   HDL 06/10/2022 57  >40 mg/dL Final   Total CHOL/HDL Ratio 06/10/2022 4.3  RATIO Final   VLDL 06/10/2022 19  0 - 40 mg/dL Final   LDL Cholesterol 06/10/2022 170 (H)  0 - 99 mg/dL Final   Comment:        Total Cholesterol/HDL:CHD Risk Coronary Heart Disease Risk Table                     Men   Women  1/2 Average Risk   3.4   3.3  Average Risk       5.0   4.4  2 X Average Risk   9.6   7.1  3 X Average Risk  23.4   11.0        Use the calculated Patient Ratio above and the CHD Risk Table to determine the patient's CHD Risk.        ATP III CLASSIFICATION (LDL):  <100     mg/dL   Optimal  100-129  mg/dL   Near or Above                    Optimal  130-159  mg/dL   Borderline  160-189  mg/dL   High  >190     mg/dL   Very High Performed at [place] [address], [place], [place] [address]    TSH 06/10/2022 1.797  0.350 - 4.500 uIU/mL Final   Comment: Performed by a 3rd Generation assay with a functional sensitivity of <=0.01 uIU/mL. Performed at [facility], [place] [address] [place], [place] [address]   Infusion on 05/24/2022  Component Date Value Ref Range Status   WBC Count 05/24/2022 5.8  4.0 - 10.5 K/uL Final   RBC 05/24/2022 5.08  3.87 - 5.11 MIL/uL Final   Hemoglobin 05/24/2022 15.2 (H)  12.0 - 15.0 g/dL Final   HCT 05/24/2022 45.4  36.0 - 46.0 % Final   MCV 05/24/2022 89.4  80.0 - 100.0 fL Final   MCH 05/24/2022 29.9  26.0 - 34.0 pg Final   MCHC 05/24/2022 33.5  30.0 - 36.0 g/dL Final   RDW 05/24/2022 12.8  11.5 - 15.5 % Final   Platelet Count 05/24/2022 243  150 - 400 K/uL Final   nRBC 05/24/2022 0.0  0.0 - 0.2 % Final   Neutrophils Relative %  05/24/2022 69  % Final   Neutro Abs 05/24/2022 4.0  1.7 - 7.7 K/uL Final   Lymphocytes Relative 05/24/2022 22  % Final   Lymphs Abs 05/24/2022 1.3  0.7 - 4.0 K/uL Final   Monocytes Relative 05/24/2022 8  % Final   Monocytes Absolute 05/24/2022 0.5  0.1 - 1.0 K/uL Final   Eosinophils Relative 05/24/2022 1  % Final   Eosinophils Absolute 05/24/2022 0.0  0.0 - 0.5 K/uL Final   Basophils Relative 05/24/2022 0  % Final   Basophils Absolute 05/24/2022 0.0  0.0 - 0.1 K/uL Final   Immature Granulocytes 05/24/2022 0  % Final   Abs Immature Granulocytes 05/24/2022 0.02  0.00 - 0.07 K/uL Final   Performed at [facility], [place] [address] [place], [place] [address]   Sodium 05/24/2022 142  135 - 145 mmol/L Final   Potassium 05/24/2022 4.0  3.5 - 5.1 mmol/L Final   Chloride 05/24/2022 108  98 -  111 mmol/L Final   CO2 05/24/2022 28  22 - 32 mmol/L Final   Glucose, Bld 05/24/2022 99  70 - 99 mg/dL Final   Glucose reference range applies only to samples taken after fasting for at least 8 hours.   BUN 05/24/2022 13  8 - 23 mg/dL Final   Creatinine 05/24/2022 0.60  0.44 - 1.00 mg/dL Final   Calcium 05/24/2022 9.7  8.9 - 10.3 mg/dL Final   Total Protein 05/24/2022 6.4 (L)  6.5 - 8.1 g/dL Final   Albumin 05/24/2022 4.4  3.5 - 5.0 g/dL Final   AST 05/24/2022 15  15 - 41 U/L Final   ALT 05/24/2022 8  0 - 44 U/L Final   Alkaline Phosphatase 05/24/2022 36 (L)  38 - 126 U/L Final   Total Bilirubin 05/24/2022 0.4  0.3 - 1.2 mg/dL Final   GFR, Estimated 05/24/2022 >60  >60 mL/min Final   Comment: (NOTE) Calculated using the CKD-EPI Creatinine Equation (2021)    Anion gap 05/24/2022 6  5 - 15 Final   Performed at [facility], [place] [address], [place], [place] [address]  Office Visit on 02/17/2022  Component Date Value Ref Range Status   Vitamin B-12 02/17/2022 223  211 - 911 pg/mL Final   TSH 02/17/2022 1.02  0.35 - 5.50 uIU/mL Final  Infusion on 01/03/2022   Component Date Value Ref Range Status   LDH 01/03/2022 131  98 - 192 U/L Final   Performed at [facility], [place] [address], [place], [place] [address]   Sodium 01/03/2022 140  135 - 145 mmol/L Final   Potassium 01/03/2022 4.4  3.5 - 5.1 mmol/L Final   Chloride 01/03/2022 104  98 - 111 mmol/L Final   CO2 01/03/2022 31  22 - 32 mmol/L Final   Glucose, Bld 01/03/2022 109 (H)  70 - 99 mg/dL Final   Glucose reference range applies only to samples taken after fasting for at least 8 hours.   BUN 01/03/2022 13  8 - 23 mg/dL Final   Creatinine 01/03/2022 0.74  0.44 - 1.00 mg/dL Final   Calcium 01/03/2022 9.4  8.9 - 10.3 mg/dL Final   Total Protein 01/03/2022 6.6  6.5 - 8.1 g/dL Final   Albumin 01/03/2022 4.3  3.5 - 5.0 g/dL Final   AST 01/03/2022 16  15 - 41 U/L Final   ALT 01/03/2022 10  0 - 44 U/L Final   Alkaline Phosphatase 01/03/2022 36 (L)  38 - 126 U/L Final   Total Bilirubin 01/03/2022 0.6  0.3 - 1.2 mg/dL Final   GFR, Estimated 01/03/2022 >60  >60 mL/min Final   Comment: (NOTE) Calculated using the CKD-EPI Creatinine Equation (2021)    Anion gap 01/03/2022 5  5 - 15 Final   Performed at [facility], [place] [address] [place], [place] [address]   WBC Count 01/03/2022 7.7  4.0 - 10.5 K/uL Final   RBC 01/03/2022 4.66  3.87 - 5.11 MIL/uL Final   Hemoglobin 01/03/2022 14.0  12.0 - 15.0 g/dL Final   HCT 01/03/2022 42.5  36.0 - 46.0 % Final   MCV 01/03/2022 91.2  80.0 - 100.0 fL Final   MCH 01/03/2022 30.0  26.0 - 34.0 pg Final   MCHC 01/03/2022 32.9  30.0 - 36.0 g/dL Final   RDW 01/03/2022 12.8  11.5 - 15.5 % Final   Platelet Count 01/03/2022 213  150 - 400 K/uL Final   nRBC  01/03/2022 0.0  0.0 - 0.2 % Final   Neutrophils Relative % 01/03/2022 69  % Final   Neutro Abs 01/03/2022 5.4  1.7 - 7.7 K/uL Final   Lymphocytes Relative 01/03/2022 19  % Final   Lymphs Abs 01/03/2022 1.5  0.7 - 4.0 K/uL Final   Monocytes Relative 01/03/2022 8  %  Final   Monocytes Absolute 01/03/2022 0.6  0.1 - 1.0 K/uL Final   Eosinophils Relative 01/03/2022 3  % Final   Eosinophils Absolute 01/03/2022 0.2  0.0 - 0.5 K/uL Final   Basophils Relative 01/03/2022 1  % Final   Basophils Absolute 01/03/2022 0.0  0.0 - 0.1 K/uL Final   Immature Granulocytes 01/03/2022 0  % Final   Abs Immature Granulocytes 01/03/2022 0.02  0.00 - 0.07 K/uL Final   Performed at [facility], [place] [address], [place], [place] [address]    Blood Alcohol level:  Lab Results  Component Value Date   ETH <10 40/98/1191    Metabolic Disorder Labs: Lab Results  Component Value Date   HGBA1C 5.5 06/10/2022   MPG 111.15 06/10/2022   MPG 105 04/05/2021   No results found for: "PROLACTIN" Lab Results  Component Value Date   CHOL 246 (H) 06/10/2022   TRIG 94 06/10/2022   HDL 57 06/10/2022   CHOLHDL 4.3 06/10/2022   VLDL 19 06/10/2022   LDLCALC 170 (H) 06/10/2022   LDLCALC 67 04/05/2021    Therapeutic Lab Levels: No results found for: "LITHIUM" No results found for: "VALPROATE" No results found for: "CBMZ"  Physical Findings   PHQ2-9    [place] Office Visit from 08/10/2020 in [place] Office Visit from 01/13/2020 in [place] Office Visit from 07/16/2019 in [place] Office Visit from 04/08/2019 in [place] Office Visit from 12/03/2018 in [place]  PHQ-2 Total Score 0 0 0 0 0      [facility] from 06/10/2022 in [facility] Admission (Discharged) from 09/03/2020 in [place] No Risk No Risk        Musculoskeletal  Strength & Muscle Tone: within normal limits Gait & Station: normal Patient leans: N/A  Psychiatric Specialty Exam  Presentation  General Appearance:  Appropriate for Environment; Casual  Eye Contact: Good  Speech: Clear and Coherent; Normal Rate  Speech  Volume: Normal  Handedness: Right   Mood and Affect  Mood: Dysphoric  Affect: Congruent   Thought Process  Thought Processes: Coherent  Descriptions of Associations:Intact  Orientation:Full (Time, Place and Person)  Thought Content:Logical  Diagnosis of Schizophrenia or Schizoaffective disorder in past: No    Hallucinations:Hallucinations: None  Ideas of Reference:None  Suicidal Thoughts:Suicidal Thoughts: No  Homicidal Thoughts:Homicidal Thoughts: No   Sensorium  Memory: Immediate Good; Recent Good; Remote Good  Judgment: Good  Insight: Good   Executive Functions  Concentration: Good  Attention Span: Good  Recall: Good  Fund of Knowledge: Good  Language: Good   Psychomotor Activity  Psychomotor Activity: Psychomotor Activity: Normal   Assets  Assets: Physical Health; Resilience; Social Support; Catering manager; Housing   Sleep  Sleep: Sleep: Fair   No data recorded  Physical Exam  Physical Exam Vitals and nursing note reviewed.  Constitutional:      General: She is not in acute distress.    Appearance: Normal appearance. She is not ill-appearing.  HENT:     Head: Normocephalic.  Eyes:  General:        Right eye: No discharge.        Left eye: No discharge.     Conjunctiva/sclera: Conjunctivae normal.  Cardiovascular:     Rate and Rhythm: Normal rate.  Pulmonary:     Effort: Pulmonary effort is normal. No respiratory distress.  Musculoskeletal:        General: Normal range of motion.     Cervical back: Normal range of motion.  Skin:    Coloration: Skin is not jaundiced or pale.  Neurological:     Mental Status: She is alert and oriented to person, place, and time.  Psychiatric:        Attention and Perception: Attention and perception normal.        Mood and Affect: Affect normal. Mood is anxious and depressed.        Speech: Speech normal.        Behavior: Behavior normal. Behavior is  cooperative.        Thought Content: Thought content normal.        Cognition and Memory: Cognition normal.        Judgment: Judgment normal.    Review of Systems  Constitutional: Negative.   HENT: Negative.    Eyes: Negative.   Respiratory: Negative.    Cardiovascular: Negative.   Musculoskeletal: Negative.   Skin: Negative.   Neurological: Negative.   Psychiatric/Behavioral:  Positive for depression. The patient is nervous/anxious.    Blood pressure 101/65, pulse 86, temperature 97.9 F (36.6 C), temperature source Oral, resp. rate 16, SpO2 100 %. There is no height or weight on file to calculate BMI.  Treatment Plan Summary:  Patient continues to meet criteria for inpatient psychiatric admission.  She will remain under involuntary commitment at this time.  She has been accepted to [facility], Dr. Weber Cooks accepting.  She will  be transported by [employer] in the a.m. on 06/14/2022.    Revonda Humphrey, NP 06/14/2022 8:40 AM

## 2022-06-14 NOTE — Plan of Care (Signed)
  Problem: Education: Goal: Knowledge of General Education information will improve Description: Including pain rating scale, medication(s)/side effects and non-pharmacologic comfort measures Outcome: Progressing   Problem: Health Behavior/Discharge Planning: Goal: Ability to manage health-related needs will improve Outcome: Progressing   Problem: Clinical Measurements: Goal: Ability to maintain clinical measurements within normal limits will improve Outcome: Progressing Goal: Will remain free from infection Outcome: Progressing Goal: Diagnostic test results will improve Outcome: Progressing Goal: Respiratory complications will improve Outcome: Progressing Goal: Cardiovascular complication will be avoided Outcome: Progressing   Problem: Nutrition: Goal: Adequate nutrition will be maintained Outcome: Progressing   Problem: Coping: Goal: Level of anxiety will decrease Outcome: Progressing   Problem: Elimination: Goal: Will not experience complications related to bowel motility Outcome: Progressing Goal: Will not experience complications related to urinary retention Outcome: Progressing   Problem: Self-Concept: Goal: Will verbalize positive feelings about self Outcome: Progressing Goal: Level of anxiety will decrease Outcome: Progressing   Problem: Safety: Goal: Ability to disclose and discuss suicidal ideas will improve Outcome: Progressing Goal: Ability to identify and utilize support systems that promote safety will improve Outcome: Progressing   Problem: Role Relationship: Goal: Will demonstrate positive changes in social behaviors and relationships Outcome: Progressing

## 2022-06-14 NOTE — ED Notes (Signed)
Patient alert and oriented . Denies SI/HI/AVH. Denies intent or plan to harm self or others. Routine conducted according to faculty protocol. Encourage patient to notify staff with any needs or concerns. Patient verbalized agreement and understanding. Will continue to monitor for safety. Patient refused  breakfast

## 2022-06-14 NOTE — [facility] Group Notes (Signed)
[place] Group Notes:  (Nursing/MHT/Case Management/Adjunct)  Date:  06/14/2022  Time:  3:58 PM  Type of Therapy:  Psychoeducational Skills  Participation Level:  Did Not Attend   Adela Lank [facility] 06/14/2022, 3:58 PM

## 2022-06-14 NOTE — ED Notes (Signed)
Patient  sleeping in no acute stress. RR even and unlabored .Environment secured .Will continue to monitor for safely.

## 2022-06-15 DIAGNOSIS — F332 Major depressive disorder, recurrent severe without psychotic features: Secondary | ICD-10-CM

## 2022-06-15 MED ORDER — LOSARTAN POTASSIUM 25 MG PO TABS
25.0000 mg | ORAL_TABLET | Freq: Every day | ORAL | Status: DC
  Administered 2022-06-16 – 2022-07-07 (×22): 25 mg via ORAL
  Filled 2022-06-15 (×22): qty 1

## 2022-06-15 MED ORDER — OLANZAPINE 5 MG PO TABS
5.0000 mg | ORAL_TABLET | Freq: Every day | ORAL | Status: DC
  Administered 2022-06-15 – 2022-06-22 (×8): 5 mg via ORAL
  Filled 2022-06-15 (×8): qty 1

## 2022-06-15 MED ORDER — BUPROPION HCL ER (XL) 150 MG PO TB24
150.0000 mg | ORAL_TABLET | Freq: Every day | ORAL | Status: DC
  Administered 2022-06-16 – 2022-06-18 (×3): 150 mg via ORAL
  Filled 2022-06-15 (×3): qty 1

## 2022-06-15 MED ORDER — METOPROLOL SUCCINATE ER 25 MG PO TB24
25.0000 mg | ORAL_TABLET | Freq: Every day | ORAL | Status: DC
  Administered 2022-06-16 – 2022-06-18 (×3): 25 mg via ORAL
  Filled 2022-06-15 (×3): qty 1

## 2022-06-15 NOTE — Plan of Care (Signed)
  Problem: Education: Goal: Knowledge of General Education information will improve Description: Including pain rating scale, medication(s)/side effects and non-pharmacologic comfort measures Outcome: Progressing   Problem: Health Behavior/Discharge Planning: Goal: Ability to manage health-related needs will improve Outcome: Progressing   Problem: Clinical Measurements: Goal: Ability to maintain clinical measurements within normal limits will improve Outcome: Progressing Goal: Will remain free from infection Outcome: Progressing Goal: Diagnostic test results will improve Outcome: Progressing Goal: Respiratory complications will improve Outcome: Progressing Goal: Cardiovascular complication will be avoided Outcome: Progressing   Problem: Nutrition: Goal: Adequate nutrition will be maintained Outcome: Progressing   Problem: Activity: Goal: Risk for activity intolerance will decrease Outcome: Progressing   Problem: Coping: Goal: Level of anxiety will decrease Outcome: Progressing   Problem: Elimination: Goal: Will not experience complications related to bowel motility Outcome: Progressing Goal: Will not experience complications related to urinary retention Outcome: Progressing   Problem: Health Behavior/Discharge Planning: Goal: Ability to make decisions will improve Outcome: Progressing Goal: Compliance with therapeutic regimen will improve Outcome: Progressing   Problem: Coping: Goal: Coping ability will improve Outcome: Progressing Goal: Will verbalize feelings Outcome: Progressing

## 2022-06-15 NOTE — [facility] Suicide Risk Assessment (Signed)
[facility] Admission Suicide Risk Assessment   Nursing information obtained from:  Patient Demographic factors:  Living alone, Age 76 or older Current Mental Status:  NA Loss Factors:  NA Historical Factors:  NA Risk Reduction Factors:  NA  Total Time spent with patient: 1 hour Principal Problem: Major depressive disorder, recurrent episode, severe (HCC) Diagnosis:  Principal Problem:   Major depressive disorder, recurrent episode, severe ([place])  Subjective Data:  Cathy [name] 77 y.o., female patient presented to [facility] as a walk in under IVC on 06/12/2022 after inquiring about buying a gun from her neighbor to end her life. She was admitted to the continuous assessment unit while awaiting inpatient psychiatric bed availability.    Venita Lick, 77 y.o., female patient seen face to face by this provider and chart reviewed on 06/13/22.  Per chart review patient has a past psychiatric history of depression and anxiety.   During evaluation MONTIA HASLIP is observed interacting with the RN on the unit.  She is in no acute distress.  She is alert/oriented x 4, calm, and cooperative.  She has normal speech and behavior.  She continues to endorse an increase in her depression and anxiety.  She has a dysphoric affect.  She is denying SI/HI/AVH.  Reports yesterday she was having a bad day and was joking with her neighbor who is a Engineer, structural about giving her a gun.  States, "I was not going to kill myself I was joking, I am paying the price for that now".  Per admission H&P patient admitted to having SI and wanting to end her life.  She does not appear to be responding to internal/external stimuli.   Patient continues to meet inpatient admission criteria.  She has been except to [facility].  However [employer] will not transport today.  Patient will remain on the unit with possible transportation in the a.m. on 06/14/2022.  Continued Clinical Symptoms:  Alcohol Use Disorder Identification Test  Final Score (AUDIT): 0 The "Alcohol Use Disorders Identification Test", Guidelines for Use in Primary Care, Second Edition.  World Pharmacologist [facility]). Score between 0-7:  no or low risk or alcohol related problems. Score between 8-15:  moderate risk of alcohol related problems. Score between 16-19:  high risk of alcohol related problems. Score 20 or above:  warrants further diagnostic evaluation for alcohol dependence and treatment.   CLINICAL FACTORS:   Severe Anxiety and/or Agitation Panic Attacks Anorexia Nervosa Depression:   Anhedonia Medical Diagnoses and Treatments/Surgeries   Musculoskeletal: Strength & Muscle Tone: within normal limits Gait & Station: normal Patient leans: N/A  Psychiatric Specialty Exam:  Presentation  General Appearance:  Appropriate for Environment; Casual  Eye Contact: Good  Speech: Clear and Coherent; Normal Rate  Speech Volume: Normal  Handedness: Right   Mood and Affect  Mood: Dysphoric  Affect: Congruent   Thought Process  Thought Processes: Coherent  Descriptions of Associations:Intact  Orientation:Full (Time, Place and Person)  Thought Content:Logical  History of Schizophrenia/Schizoaffective disorder:No  Duration of Psychotic Symptoms:No data recorded Hallucinations:Hallucinations: None  Ideas of Reference:None  Suicidal Thoughts:Suicidal Thoughts: No  Homicidal Thoughts:Homicidal Thoughts: No   Sensorium  Memory: Immediate Good; Recent Good; Remote Good  Judgment: Good  Insight: Good   Executive Functions  Concentration: Good  Attention Span: Good  Recall: Good  Fund of Knowledge: Good  Language: Good   Psychomotor Activity  Psychomotor Activity: Psychomotor Activity: Normal   Assets  Assets: Physical Health; Resilience; Social Support; Catering manager;  Housing   Sleep  Sleep: Sleep: Fair    Physical Exam: Physical Exam: Mood is depressed and  anxious affect is euthymic ROS: No motivation, depression, panic attacks, and sleeping too much.  Lack of appetite. Blood pressure (!) 88/51, pulse 84, temperature 97.8 F (36.6 C), temperature source Oral, resp. rate 13, height '5\' 2"'$  (1.575 m), SpO2 97 %. Body mass index is 20.01 kg/m.   COGNITIVE FEATURES THAT CONTRIBUTE TO RISK:  None    SUICIDE RISK:   Minimal: No identifiable suicidal ideation.  Patients presenting with no risk factors but with morbid ruminations; may be classified as minimal risk based on the severity of the depressive symptoms  PLAN OF CARE: See orders  I certify that inpatient services furnished can reasonably be expected to improve the patient's condition.   Parks Ranger, DO 06/15/2022, 10:49 AM

## 2022-06-15 NOTE — Progress Notes (Signed)
Patient is alert and cooperative but guarded on assessment.  Patient is medication compliant.  Patient denies anxiety, depression, AVH and SI/HI.  Patient has been visible in dayroom interacting with other patients and visiting with son.  Patient denies pain.  Patient can contract for safety.  Q 15 minute rounds in progress, will continue to monitor.

## 2022-06-15 NOTE — Group Note (Signed)
LCSW Group Therapy Note  Group Date: 06/15/2022 Start Time: 1300 End Time: 1400   Type of Therapy and Topic:  Group Therapy - How To Cope with Nervousness about Discharge   Participation Level:  Did Not Attend   Description of Group This process group involved identification of patients' feelings about discharge. Some of them are scheduled to be discharged soon, while others are new admissions, but each of them was asked to share thoughts and feelings surrounding discharge from the hospital. One common theme was that they are excited at the prospect of going home, while another was that many of them are apprehensive about sharing why they were hospitalized. Patients were given the opportunity to discuss these feelings with their peers in preparation for discharge.  Therapeutic Goals  Patient will identify their overall feelings about pending discharge. Patient will think about how they might proactively address issues that they believe will once again arise once they get home (i.e. with parents). Patients will participate in discussion about having hope for change.   Summary of Patient Progress:   Patient did not attend group despite encouraged participation.     Therapeutic Modalities Cognitive Behavioral Therapy   Larose Kells 06/15/2022  2:05 PM

## 2022-06-15 NOTE — H&P (Signed)
Psychiatric Admission Assessment Adult  Patient Identification: Cathy [name] MRN:  [id number] Date of Evaluation:  06/15/2022 Chief Complaint:  Major depressive disorder, recurrent episode, severe ([place]) [F33.2] Principal Diagnosis: Major depressive disorder, recurrent episode, severe ([place]) Diagnosis:  Principal Problem:   Major depressive disorder, recurrent episode, severe ([place])  History of Present Illness: Cathy [name] is a 77 year old white female who was involuntarily admitted to inpatient psychiatry for worsening depression and suicidal ideation.  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  She has been seeing her PCP who initially had her on Lexapro.  She did not feel like it was helping and eventually was changed to Trintellix.  She says that she developed bad thoughts and asked her neighbor for a firearm.  She says that she was joking but was having suicidal ideation.  She has never been psychiatrically hospitalized.  She has never seen a psychiatrist before.  She denies having depressed mood or suicide attempts in the past.  She was at the [facility] and started on Effexor XR which is at 150 mg/day.  She denies any side effects from it but states that she still feels depressed and anxious.  She describes her panic attacks as wanting to crawl out of her body.  She says that she is sleeping too much and she lacks motivation. She also has lost weight.  She currently lives in [place] alone.  She is a widow since 33.  She was a Education officer, museum for second and third grade.  She has a grown son that lives close who brought her to the emergency room.  Associated Signs/Symptoms: Depression Symptoms:  depressed mood, anhedonia, hypersomnia, suicidal thoughts without plan, anxiety, panic attacks, weight loss, (Hypo) Manic Symptoms:   None Anxiety Symptoms:  Excessive Worry, Panic Symptoms, Obsessive Compulsive Symptoms:    None, Social Anxiety, Psychotic Symptoms: None PTSD Symptoms: None  Total Time spent with patient: 1 hour  Past Psychiatric History: As above  Is the patient at risk to self? Yes.    Has the patient been a risk to self in the past 6 months? Yes.    Has the patient been a risk to self within the distant past? No.  Is the patient a risk to others? No.  Has the patient been a risk to others in the past 6 months? No.  Has the patient been a risk to others within the distant past? No.   [place] Scale:  [facility] Admission (Current) from 06/14/2022 in [facility] ED from 06/10/2022 in [facility] Admission (Discharged) from 09/03/2020 in [facility] No Risk No Risk No Risk        Prior Inpatient Therapy: No. If yes, describe  Prior Outpatient Therapy: No. If yes, describe   Alcohol Screening: 1. How often do you have a drink containing alcohol?: Never 2. How many drinks containing alcohol do you have on a typical day when you are drinking?: 1 or 2 3. How often do you have six or more drinks on one occasion?: Never AUDIT-C Score: 0 4. How often during the last year have you found that you were not able to stop drinking once you had started?: Never 5. How often during the last year have you failed to do what was normally expected from you because of drinking?: Never 6. How often during the last year have you needed a first drink in the morning to  get yourself going after a heavy drinking session?: Never 7. How often during the last year have you had a feeling of guilt of remorse after drinking?: Never 8. How often during the last year have you been unable to remember what happened the night before because you had been drinking?: Never 9. Have you or someone else been injured as a result of your drinking?: No 10. Has a relative or friend or a doctor or another health worker been concerned about your drinking or  suggested you cut down?: No Alcohol Use Disorder Identification Test Final Score (AUDIT): 0 Substance Abuse History in the last 12 months:  No. Consequences of Substance Abuse: NA Previous Psychotropic Medications: Yes  Psychological Evaluations: Yes  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   Vision changes     Past Surgical History:  Procedure Laterality Date   CHOLECYSTECTOMY     IR IMAGING GUIDED PORT INSERTION  09/21/2020   LITHOTRIPSY  1984   LYMPH NODE BIOPSY     MASS BIOPSY Left 09/03/2020   Procedure: LEFT NECK JUGULAR NODE;  Surgeon: Rozetta Nunnery, MD;  Location: [facility];  Service: ENT;  Laterality: Left;   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   Procedure: LEFT TOTAL KNEE ARTHROPLASTY;  Surgeon: Gaynelle Arabian, MD;  Location: [facility];  Service: Orthopedics;  Laterality: Left;   Family History:  Family History  Problem Relation Age of Onset   Hypertension Mother    Stroke Mother    Heart attack Mother        CABG, 5 Stents   Dementia Mother    Cancer Father        Kidney   Asthma Sister    Diabetes Brother        Borderline   Alcohol abuse Brother    Cancer Maternal Aunt        stomach ca   Cancer Maternal Aunt        ovarian ca   Breast cancer Cousin    Colon cancer Neg Hx    Family Psychiatric  History: Unremarkable Tobacco Screening:  Social History   Tobacco Use  Smoking Status Never  Smokeless  Tobacco Never    [facility] Tobacco Counseling     Are you interested in Tobacco Cessation Medications?  N/A, patient does not use tobacco products Counseled patient on smoking cessation:  N/A, patient does not use tobacco products Reason Tobacco Screening Not Completed: No value filed.       Social History:  Social History   Substance and Sexual Activity  Alcohol Use No     Social History   Substance and Sexual Activity  Drug Use No    Additional Social History:  Allergies:   Allergies  Allergen Reactions   Codeine     Patients states when she was young, she was told she "acted weird" after being given codeine   Lab Results:  Results for orders placed or performed during the hospital encounter of 06/10/22 (from the past 48 hour(s))  POC SARS Coronavirus 2 Ag     Status: None   Collection Time: 06/14/22 10:11 AM  Result Value Ref Range   SARSCOV2ONAVIRUS 2 AG NEGATIVE NEGATIVE    Comment: (NOTE) SARS-CoV-2 antigen NOT DETECTED.   Negative results are presumptive.  Negative results do not preclude SARS-CoV-2 infection and should not be used as the sole basis for treatment or other patient management decisions, including infection  control decisions, particularly in the presence of clinical signs and  symptoms consistent with COVID-19, or in those who have been in contact with the virus.  Negative results must be combined with clinical observations, patient history, and epidemiological information. The expected result is Negative.  Fact Sheet for Patients: HandmadeRecipes.com.cy  Fact Sheet for Healthcare Providers: FuneralLife.at  This test is not yet approved or cleared by the [place] FDA and  has been authorized for detection and/or diagnosis of SARS-CoV-2 by FDA under an Emergency Use Authorization (EUA).  This EUA will remain in effect (meaning this test can be used) for the  duration of  the COV ID-19 declaration under Section 564(b)(1) of the Act, 21 U.S.C. section 360bbb-3(b)(1), unless the authorization is terminated or revoked sooner.      Blood Alcohol level:  Lab Results  Component Value Date   ETH <10 12/11/1599    Metabolic Disorder Labs:  Lab Results  Component Value Date   HGBA1C 5.5 06/10/2022   MPG 111.15 06/10/2022   MPG 105 04/05/2021   No results found for: "PROLACTIN" Lab Results  Component Value Date   CHOL 246 (H) 06/10/2022   TRIG 94 06/10/2022   HDL 57 06/10/2022   CHOLHDL 4.3 06/10/2022   VLDL 19 06/10/2022   LDLCALC 170 (H) 06/10/2022   LDLCALC 67 04/05/2021    Current Medications: Current Facility-Administered Medications  Medication Dose Route Frequency Provider Last Rate Last Admin   acetaminophen (TYLENOL) tablet 650 mg  650 mg Oral Q6H PRN Parks Ranger, DO       alum & mag hydroxide-simeth (MAALOX/MYLANTA) 200-200-20 MG/5ML suspension 30 mL  30 mL Oral Q4H PRN Parks Ranger, DO       aspirin EC tablet 81 mg  81 mg Oral Daily Parks Ranger, DO   81 mg at 06/15/22 1009   atorvastatin (LIPITOR) tablet 20 mg  20 mg Oral Daily Parks Ranger, DO   20 mg at 06/15/22 1009   [START ON 06/16/2022] buPROPion (WELLBUTRIN XL) 24 hr tablet 150 mg  150 mg Oral Daily Parks Ranger, DO       cholecalciferol (VITAMIN D3) 25 MCG (1000 UNIT) tablet 1,000 Units  1,000 Units Oral Daily Parks Ranger, DO   1,000 Units at 06/15/22 1009   cyanocobalamin (VITAMIN B12) tablet 1,000 mcg  1,000 mcg Oral Daily Parks Ranger, DO   1,000 mcg at 06/15/22 1009   hydrOXYzine (ATARAX) tablet 10 mg  10 mg Oral Q8H PRN Parks Ranger, DO   10 mg at 06/14/22 2141   [START ON 06/16/2022] losartan (COZAAR) tablet 25 mg  25 mg Oral Daily Parks Ranger, DO       magnesium hydroxide (MILK OF MAGNESIA) suspension 30  mL  30 mL Oral Daily PRN Parks Ranger, DO   30 mL  at 06/15/22 0815   [START ON 06/16/2022] metoprolol succinate (TOPROL-XL) 24 hr tablet 25 mg  25 mg Oral Daily Parks Ranger, DO       multivitamin with minerals tablet 1 tablet  1 tablet Oral Daily Parks Ranger, DO   1 tablet at 06/15/22 1009   OLANZapine (ZYPREXA) tablet 5 mg  5 mg Oral QHS Parks Ranger, DO       pantoprazole (PROTONIX) EC tablet 40 mg  40 mg Oral Daily Parks Ranger, DO   40 mg at 06/15/22 1009   traZODone (DESYREL) tablet 50 mg  50 mg Oral QHS PRN Parks Ranger, DO   50 mg at 06/14/22 2141   venlafaxine XR (EFFEXOR-XR) 24 hr capsule 150 mg  150 mg Oral Q breakfast Parks Ranger, DO   150 mg at 06/15/22 6378   PTA Medications: Medications Prior to Admission  Medication Sig Dispense Refill Last Dose   albuterol (VENTOLIN HFA) 108 (90 Base) MCG/ACT inhaler Inhale 2 puffs into the lungs every 6 (six) hours as needed for wheezing or shortness of breath. 8 g 2    aspirin EC 81 MG tablet Take 81 mg by mouth daily.      atorvastatin (LIPITOR) 20 MG tablet Take 1 tablet (20 mg total) by mouth daily. 90 tablet 1    cetirizine (ZYRTEC) 10 MG tablet Take 1 tablet (10 mg total) by mouth daily. 90 tablet 1    cholecalciferol (CHOLECALCIFEROL) 25 MCG tablet Take 1 tablet (1,000 Units total) by mouth daily.      cyanocobalamin (VITAMIN B12) 1000 MCG tablet Take 1 tablet (1,000 mcg total) by mouth daily.      denosumab (PROLIA) 60 MG/ML SOSY injection Inject 60 mg into the skin every 6 (six) months.      estradiol (ESTRACE) 0.1 MG/GM vaginal cream Discard plastic applicator. Insert blueberry size amount of cream on finger in vagina daily x1 week then 2x per week.      fluticasone furoate-vilanterol (BREO ELLIPTA) 100-25 MCG/ACT AEPB Inhale 1 puff into the lungs daily. (Patient taking differently: Inhale 1 puff into the lungs daily as needed.) 60 each 5    glucose blood (ACCU-CHEK AVIVA PLUS) test strip Use to Monitor blood sugars  daily. DX. E11.9 100 each 5    hydrochlorothiazide (MICROZIDE) 12.5 MG capsule Take 1 capsule (12.5 mg total) by mouth daily. 90 capsule 1    hydroxypropyl methylcellulose / hypromellose (ISOPTO TEARS / GONIOVISC) 2.5 % ophthalmic solution Place 2 drops into both eyes 3 (three) times daily as needed for dry eyes.      lidocaine-prilocaine (EMLA) cream Apply to affected area once (Patient taking differently: 1 Application as needed. Apply to affected area to port as needed) 30 g 3    losartan (COZAAR) 100 MG tablet TAKE 1 TABLET BY MOUTH ONCE A DAY 90 tablet 1    metoprolol succinate (TOPROL-XL) 50 MG 24 hr tablet Take 1 tablet (50 mg total) by mouth daily. Take with or immediately following a meal. 90 tablet 1    Multiple Vitamin (MULTIVITAMIN) capsule Take 1 capsule by mouth daily.      omeprazole (PRILOSEC) 20 MG capsule Take 1 capsule (20 mg total) by mouth daily. 90 capsule 1    venlafaxine XR (EFFEXOR XR) 75 MG 24 hr capsule Take 2 capsules (150 mg total) by mouth daily with breakfast. (Patient  taking differently: Take 75 mg by mouth daily with breakfast.)       Musculoskeletal: Strength & Muscle Tone: within normal limits Gait & Station: normal Patient leans: N/A            Psychiatric Specialty Exam:  Presentation  General Appearance:  Appropriate for Environment; Casual  Eye Contact: Good  Speech: Clear and Coherent; Normal Rate  Speech Volume: Normal  Handedness: Right   Mood and Affect  Mood: Dysphoric  Affect: Congruent   Thought Process  Thought Processes: Coherent  Duration of Psychotic Symptoms: As above Past Diagnosis of Schizophrenia or Psychoactive disorder: No  Descriptions of Associations:Intact  Orientation:Full (Time, Place and Person)  Thought Content:Logical  Hallucinations:Hallucinations: None  Ideas of Reference:None  Suicidal Thoughts:Suicidal Thoughts: No  Homicidal Thoughts:Homicidal Thoughts: No   Sensorium   Memory: Immediate Good; Recent Good; Remote Good  Judgment: Good  Insight: Good   Executive Functions  Concentration: Good  Attention Span: Good  Recall: Good  Fund of Knowledge: Good  Language: Good   Psychomotor Activity  Psychomotor Activity: Psychomotor Activity: Normal   Assets  Assets: Physical Health; Resilience; Social Support; Catering manager; Housing   Sleep  Sleep: Sleep: Fair    Physical Exam: Physical Exam Constitutional:      Appearance: Normal appearance.  HENT:     Head: Normocephalic and atraumatic.     Mouth/Throat:     Pharynx: Oropharynx is clear.  Eyes:     Pupils: Pupils are equal, round, and reactive to light.  Cardiovascular:     Rate and Rhythm: Normal rate and regular rhythm.  Pulmonary:     Effort: Pulmonary effort is normal.     Breath sounds: Normal breath sounds.  Abdominal:     General: Abdomen is flat.     Palpations: Abdomen is soft.  Musculoskeletal:        General: Normal range of motion.  Skin:    General: Skin is warm and dry.  Neurological:     General: No focal deficit present.     Mental Status: She is alert and oriented to person, place, and time. Mental status is at baseline.  Psychiatric:        Attention and Perception: Attention and perception normal.        Mood and Affect: Affect normal. Mood is anxious and depressed.        Speech: Speech normal.        Behavior: Behavior normal. Behavior is cooperative.        Thought Content: Thought content normal.        Cognition and Memory: Cognition and memory normal.        Judgment: Judgment normal.    Review of Systems  Constitutional: Negative.   HENT: Negative.    Eyes: Negative.   Respiratory: Negative.    Cardiovascular: Negative.   Gastrointestinal: Negative.   Genitourinary: Negative.   Musculoskeletal: Negative.   Skin: Negative.   Neurological: Negative.   Endo/Heme/Allergies: Negative.   Psychiatric/Behavioral:   Positive for depression. The patient is nervous/anxious.    Blood pressure (!) 88/51, pulse 84, temperature 97.8 F (36.6 C), temperature source Oral, resp. rate 13, height '5\' 2"'$  (1.575 m), SpO2 97 %. Body mass index is 20.01 kg/m.  Treatment Plan Summary: Daily contact with patient to assess and evaluate symptoms and progress in treatment, Medication management, and Plan decrease blood pressure medicine.  Continue Effexor XR 150 mg daily.  Start Zyprexa 5 mg at bedtime.  Start  Wellbutrin XL 150 mg in the morning.  Observation Level/Precautions:  15 minute checks  Laboratory:  CBC Chemistry Profile  Psychotherapy:    Medications:    Consultations:    Discharge Concerns:    Estimated LOS:  Other:     Physician Treatment Plan for Primary Diagnosis: Major depressive disorder, recurrent episode, severe ([place]) Long Term Goal(s): Improvement in symptoms so as ready for discharge  Short Term Goals: Ability to identify changes in lifestyle to reduce recurrence of condition will improve, Ability to verbalize feelings will improve, Ability to disclose and discuss suicidal ideas, Ability to demonstrate self-control will improve, Ability to identify and develop effective coping behaviors will improve, Ability to maintain clinical measurements within normal limits will improve, Compliance with prescribed medications will improve, and Ability to identify triggers associated with substance abuse/mental health issues will improve  Physician Treatment Plan for Secondary Diagnosis: Principal Problem:   Major depressive disorder, recurrent episode, severe ([place]) 6 2  I certify that inpatient services furnished can reasonably be expected to improve the patient's condition.    Parks Ranger, DO 1/3/202410:54 AM

## 2022-06-15 NOTE — BH IP Treatment Plan (Signed)
Interdisciplinary Treatment and Diagnostic Plan Update  06/15/2022 Time of Session: 9:30AM BRE PECINA MRN: [id number]  Principal Diagnosis: Major depressive disorder, recurrent episode, severe ([place])  Secondary Diagnoses: Principal Problem:   Major depressive disorder, recurrent episode, severe ([place])   Current Medications:  Current Facility-Administered Medications  Medication Dose Route Frequency Provider Last Rate Last Admin   acetaminophen (TYLENOL) tablet 650 mg  650 mg Oral Q6H PRN Parks Ranger, DO       alum & mag hydroxide-simeth (MAALOX/MYLANTA) 200-200-20 MG/5ML suspension 30 mL  30 mL Oral Q4H PRN Parks Ranger, DO       aspirin EC tablet 81 mg  81 mg Oral Daily Parks Ranger, DO   81 mg at 06/15/22 1009   atorvastatin (LIPITOR) tablet 20 mg  20 mg Oral Daily Parks Ranger, DO   20 mg at 06/15/22 1009   [START ON 06/16/2022] buPROPion (WELLBUTRIN XL) 24 hr tablet 150 mg  150 mg Oral Daily Parks Ranger, DO       cholecalciferol (VITAMIN D3) 25 MCG (1000 UNIT) tablet 1,000 Units  1,000 Units Oral Daily Parks Ranger, DO   1,000 Units at 06/15/22 1009   cyanocobalamin (VITAMIN B12) tablet 1,000 mcg  1,000 mcg Oral Daily Parks Ranger, DO   1,000 mcg at 06/15/22 1009   hydrOXYzine (ATARAX) tablet 10 mg  10 mg Oral Q8H PRN Parks Ranger, DO   10 mg at 06/14/22 2141   [START ON 06/16/2022] losartan (COZAAR) tablet 25 mg  25 mg Oral Daily Parks Ranger, DO       magnesium hydroxide (MILK OF MAGNESIA) suspension 30 mL  30 mL Oral Daily PRN Parks Ranger, DO   30 mL at 06/15/22 0815   [START ON 06/16/2022] metoprolol succinate (TOPROL-XL) 24 hr tablet 25 mg  25 mg Oral Daily Parks Ranger, DO       multivitamin with minerals tablet 1 tablet  1 tablet Oral Daily Parks Ranger, DO   1 tablet at 06/15/22 1009   OLANZapine (ZYPREXA) tablet 5 mg  5 mg Oral QHS Parks Ranger, DO       pantoprazole (PROTONIX) EC tablet 40 mg  40 mg Oral Daily Parks Ranger, DO   40 mg at 06/15/22 1009   traZODone (DESYREL) tablet 50 mg  50 mg Oral QHS PRN Parks Ranger, DO   50 mg at 06/14/22 2141   venlafaxine XR (EFFEXOR-XR) 24 hr capsule 150 mg  150 mg Oral Q breakfast Parks Ranger, DO   150 mg at 06/15/22 1287   PTA Medications: Medications Prior to Admission  Medication Sig Dispense Refill Last Dose   albuterol (VENTOLIN HFA) 108 (90 Base) MCG/ACT inhaler Inhale 2 puffs into the lungs every 6 (six) hours as needed for wheezing or shortness of breath. 8 g 2    aspirin EC 81 MG tablet Take 81 mg by mouth daily.      atorvastatin (LIPITOR) 20 MG tablet Take 1 tablet (20 mg total) by mouth daily. 90 tablet 1    cetirizine (ZYRTEC) 10 MG tablet Take 1 tablet (10 mg total) by mouth daily. 90 tablet 1    cholecalciferol (CHOLECALCIFEROL) 25 MCG tablet Take 1 tablet (1,000 Units total) by mouth daily.      cyanocobalamin (VITAMIN B12) 1000 MCG tablet Take 1 tablet (1,000 mcg total) by mouth daily.      denosumab (PROLIA) 60 MG/ML SOSY injection  Inject 60 mg into the skin every 6 (six) months.      estradiol (ESTRACE) 0.1 MG/GM vaginal cream Discard plastic applicator. Insert blueberry size amount of cream on finger in vagina daily x1 week then 2x per week.      fluticasone furoate-vilanterol (BREO ELLIPTA) 100-25 MCG/ACT AEPB Inhale 1 puff into the lungs daily. (Patient taking differently: Inhale 1 puff into the lungs daily as needed.) 60 each 5    glucose blood (ACCU-CHEK AVIVA PLUS) test strip Use to Monitor blood sugars daily. DX. E11.9 100 each 5    hydrochlorothiazide (MICROZIDE) 12.5 MG capsule Take 1 capsule (12.5 mg total) by mouth daily. 90 capsule 1    hydroxypropyl methylcellulose / hypromellose (ISOPTO TEARS / GONIOVISC) 2.5 % ophthalmic solution Place 2 drops into both eyes 3 (three) times daily as needed for dry eyes.       lidocaine-prilocaine (EMLA) cream Apply to affected area once (Patient taking differently: 1 Application as needed. Apply to affected area to port as needed) 30 g 3    losartan (COZAAR) 100 MG tablet TAKE 1 TABLET BY MOUTH ONCE A DAY 90 tablet 1    metoprolol succinate (TOPROL-XL) 50 MG 24 hr tablet Take 1 tablet (50 mg total) by mouth daily. Take with or immediately following a meal. 90 tablet 1    Multiple Vitamin (MULTIVITAMIN) capsule Take 1 capsule by mouth daily.      omeprazole (PRILOSEC) 20 MG capsule Take 1 capsule (20 mg total) by mouth daily. 90 capsule 1    venlafaxine XR (EFFEXOR XR) 75 MG 24 hr capsule Take 2 capsules (150 mg total) by mouth daily with breakfast. (Patient taking differently: Take 75 mg by mouth daily with breakfast.)       Patient Stressors:    Patient Strengths:    Treatment Modalities: Medication Management, Group therapy, Case management,  1 to 1 session with clinician, Psychoeducation, Recreational therapy.   Physician Treatment Plan for Primary Diagnosis: Major depressive disorder, recurrent episode, severe ([place]) Long Term Goal(s): Improvement in symptoms so as ready for discharge   Short Term Goals: Ability to identify changes in lifestyle to reduce recurrence of condition will improve Ability to verbalize feelings will improve Ability to disclose and discuss suicidal ideas Ability to demonstrate self-control will improve Ability to identify and develop effective coping behaviors will improve Ability to maintain clinical measurements within normal limits will improve Compliance with prescribed medications will improve Ability to identify triggers associated with substance abuse/mental health issues will improve  Medication Management: Evaluate patient's response, side effects, and tolerance of medication regimen.  Therapeutic Interventions: 1 to 1 sessions, Unit Group sessions and Medication administration.  Evaluation of Outcomes: Not  Met  Physician Treatment Plan for Secondary Diagnosis: Principal Problem:   Major depressive disorder, recurrent episode, severe ([place])  Long Term Goal(s): Improvement in symptoms so as ready for discharge   Short Term Goals: Ability to identify changes in lifestyle to reduce recurrence of condition will improve Ability to verbalize feelings will improve Ability to disclose and discuss suicidal ideas Ability to demonstrate self-control will improve Ability to identify and develop effective coping behaviors will improve Ability to maintain clinical measurements within normal limits will improve Compliance with prescribed medications will improve Ability to identify triggers associated with substance abuse/mental health issues will improve     Medication Management: Evaluate patient's response, side effects, and tolerance of medication regimen.  Therapeutic Interventions: 1 to 1 sessions, Unit Group sessions and Medication administration.  Evaluation  of Outcomes: Not Met   RN Treatment Plan for Primary Diagnosis: Major depressive disorder, recurrent episode, severe ([place]) Long Term Goal(s): Knowledge of disease and therapeutic regimen to maintain health will improve  Short Term Goals: Ability to remain free from injury will improve, Ability to verbalize frustration and anger appropriately will improve, Ability to demonstrate self-control, Ability to participate in decision making will improve, Ability to verbalize feelings will improve, Ability to disclose and discuss suicidal ideas, Ability to identify and develop effective coping behaviors will improve, and Compliance with prescribed medications will improve  Medication Management: RN will administer medications as ordered by provider, will assess and evaluate patient's response and provide education to patient for prescribed medication. RN will report any adverse and/or side effects to prescribing provider.  Therapeutic Interventions: 1  on 1 counseling sessions, Psychoeducation, Medication administration, Evaluate responses to treatment, Monitor vital signs and CBGs as ordered, Perform/monitor CIWA, COWS, AIMS and Fall Risk screenings as ordered, Perform wound care treatments as ordered.  Evaluation of Outcomes: Not Met   LCSW Treatment Plan for Primary Diagnosis: Major depressive disorder, recurrent episode, severe ([place]) Long Term Goal(s): Safe transition to appropriate next level of care at discharge, Engage patient in therapeutic group addressing interpersonal concerns.  Short Term Goals: Engage patient in aftercare planning with referrals and resources, Increase social support, Increase ability to appropriately verbalize feelings, Increase emotional regulation, Facilitate acceptance of mental health diagnosis and concerns, and Increase skills for wellness and recovery  Therapeutic Interventions: Assess for all discharge needs, 1 to 1 time with Social worker, Explore available resources and support systems, Assess for adequacy in community support network, Educate family and significant other(s) on suicide prevention, Complete Psychosocial Assessment, Interpersonal group therapy.  Evaluation of Outcomes: Not Met   Progress in Treatment: Attending groups: No. Participating in groups: No. Taking medication as prescribed: Yes. Toleration medication: Yes. Family/Significant other contact made: No, will contact:  when given permission Patient understands diagnosis: Yes. Discussing patient identified problems/goals with staff: Yes. Medical problems stabilized or resolved: Yes. Denies suicidal/homicidal ideation: Yes. Issues/concerns per patient self-inventory: No. Other: None  New problem(s) identified: No, Describe:  None  New Short Term/Long Term Goal(s): Patient to work towards  medication management for mood stabilization; elimination of SI thoughts; development of comprehensive mental wellness plan.   Patient  Goals:  "just tryna sort myself out"  Discharge Plan or Barriers: CSW will assist pt with development of appropriate discharge/aftercare plan.   Reason for Continuation of Hospitalization: Anxiety Depression Medication stabilization  Estimated Length of Stay: 1-7 days  Last 3 [place] Suicide Severity Risk Score: Flowsheet Row Admission (Current) from 06/14/2022 in [facility] from 06/10/2022 in [facility] Admission (Discharged) from 09/03/2020 in [facility] No Risk No Risk No Risk       Last PHQ 2/9 Scores:    08/10/2020    9:35 AM 01/13/2020    3:59 PM 07/16/2019    9:36 AM  Depression screen PHQ 2/9  Decreased Interest 0 0 0  Down, Depressed, Hopeless 0 0 0  PHQ - 2 Score 0 0 0    Scribe for Treatment Team: Channelle Bottger A [place], [place] 06/15/2022 11:28 AM

## 2022-06-15 NOTE — BHH Counselor (Signed)
Adult Comprehensive Assessment  Patient ID: Cathy [name], female   DOB: 04-09-1946, 77 y.o.   MRN: [id number]  Information Source: Information source: Patient  Current Stressors:  Patient states their primary concerns and needs for treatment are:: "was feeling down and said i wanted to use my neighbors gun" Patient states their goals for this hospitilization and ongoing recovery are:: "to get back to feeling better again" Educational / Learning stressors: pt denies Employment / Job issues: Pt states that she is retired Family Relationships: Pt denies Museum/gallery curator / Lack of resources (include bankruptcy): "everyone has those issues" Housing / Lack of housing: pt denies Physical health (include injuries & life threatening diseases): high blood pressure Social relationships: pt denies Substance abuse: pt denies Bereavement / Loss: pt denies  Living/Environment/Situation:  Living Arrangements: Alone Living conditions (as described by patient or guardian): pt denies Who else lives in the home?: pt states that she lives alone How long has patient lived in current situation?: "since my husband died in 09/05/1993" What is atmosphere in current home: Comfortable, Loving  Family History:  Marital status: Widowed Widowed, when?: 1993/09/05 Are you sexually active?: No What is your sexual orientation?: unable to assess Has your sexual activity been affected by drugs, alcohol, medication, or emotional stress?: unable to assess Does patient have children?: Yes How many children?: 1 How is patient's relationship with their children?: pt states hat she has a good relationship with her son  Childhood History:  By whom was/is the patient raised?: Both parents Description of patient's relationship with caregiver when they were a child: it was good Patient's description of current relationship with people who raised him/her: deceased How were you disciplined when you got in trouble as a child/adolescent?: we  got spankings Does patient have siblings?: Yes Number of Siblings: 2 Description of patient's current relationship with siblings: sister lives in [place] and brother, lives close by Did patient suffer any verbal/emotional/physical/sexual abuse as a child?: No Did patient suffer from severe childhood neglect?: No Has patient ever been sexually abused/assaulted/raped as an adolescent or adult?: No Was the patient ever a victim of a crime or a disaster?: No Witnessed domestic violence?: No Has patient been affected by domestic violence as an adult?: No  Education:  Highest grade of school patient has completed: college, Buyer, retail Currently a Ship broker?: No Learning disability?: No  Employment/Work Situation:   Employment Situation: Retired Social research officer, government has Been Impacted by Current Illness: No What is the Longest Time Patient has Held a Job?: 25 years Where was the Patient Employed at that Time?: teacher Has Patient ever Been in the [employer]?: No  Financial Resources:   Museum/gallery curator resources: Armed forces training and education officer, Medicare Does patient have a Programmer, applications or guardian?: No  Alcohol/Substance Abuse:   What has been your use of drugs/alcohol within the last 12 months?: pt denies If attempted suicide, did drugs/alcohol play a role in this?: No Alcohol/Substance Abuse Treatment Hx: Denies past history Has alcohol/substance abuse ever caused legal problems?: No  Social Support System:   Pensions consultant Support System: Manufacturing engineer System: neighbors, siblings, son Type of faith/religion: [place] How does patient's faith help to cope with current illness?: "I thought it did until now"  Leisure/Recreation:   Do You Have Hobbies?: No Leisure and Hobbies: "thrifting, bargaining at [employer]"  Strengths/Needs:   What is the patient's perception of their strengths?: "right now nothing" Patient states they can use these personal strengths during their  treatment to contribute to their  recovery: pt denies Patient states these barriers may affect/interfere with their treatment: pt denies Patient states these barriers may affect their return to the community: pt denies Other important information patient would like considered in planning for their treatment: pt denies  Discharge Plan:   Currently receiving community mental health services: No Patient states concerns and preferences for aftercare planning are: "i don't know" Patient states they will know when they are safe and ready for discharge when: like it was before Does patient have access to transportation?: Yes Does patient have financial barriers related to discharge medications?: No Will patient be returning to same living situation after discharge?: Yes  Summary/Recommendations:   Summary and Recommendations (to be completed by the evaluator): Patient is a 77 year old female, widowed, from [place], [place] ([place]). She reports that she receives SSI and is currently retired. She presents to the hospital following suicidal ideations with a plan. Recents stressors include suicidal ideations, panic attacks and reports of feeling low and unmotivated. She has a primary diagnosis of Major depressive disorder, recurrent episode, severe. Patient is interested in referral to community mental health services if she can afford it and medication management.  Recommendations include: crisis stabilization, therapeutic milieu, encourage group attendance and participation, medication management for mood stabilization and development of comprehensive mental wellness plan.  Cathy [name] A [place]. 06/15/2022

## 2022-06-15 NOTE — BHH Group Notes (Signed)
[facility] Group Notes:  (Nursing/MHT/Case Management/Adjunct)  Date:  06/15/2022  Time:  11:12 AM  Type of Therapy:   Community Meeting  Participation Level:  Did Not Attend   Cathy [name] 06/15/2022, 11:12 AM

## 2022-06-15 NOTE — BHH Suicide Risk Assessment (Signed)
Cathy [name] INPATIENT:  Family/Significant Other Suicide Prevention Education  Suicide Prevention Education:  Education Completed; Cathy [name], son, [phone]  has been identified by the patient as the family member/significant other with whom the patient will be residing, and identified as the person(s) who will aid the patient in the event of a mental health crisis (suicidal ideations/suicide attempt).  With written consent from the patient, the family member/significant other has been provided the following suicide prevention education, prior to the and/or following the discharge of the patient.  The suicide prevention education provided includes the following: Suicide risk factors Suicide prevention and interventions National Suicide Hotline telephone number [facility] assessment telephone number [facility] Emergency Assistance [place] and/or Residential Mobile Crisis Unit telephone number  Request made of family/significant other to: Remove weapons (e.g., guns, rifles, knives), all items previously/currently identified as safety concern.   Remove drugs/medications (over-the-counter, prescriptions, illicit drugs), all items previously/currently identified as a safety concern.  The family member/significant other verbalizes understanding of the suicide prevention education information provided.  The family member/significant other agrees to remove the items of safety concern listed above.   CSW spoke with patient's son.  He reports that his mother has been feeling depressed for the past year.  He reports that he is unsure of triggers to change in her disposition.  He reports that patient was on Lexapro but stopped taking the medication because it did not appear effective.  He reports that she was then on Effexor, however, that did not seem effective either.  He reports that patient was asking her neighbor about purchasing a gun.  He reports that the neighbor informed  him and he brought her to the hospital.  He reports that patient may be a danger to self but not to others.  He reports that patient does not have access to weapons. He reports that "this is not my mom".  He reports that patient "hasn't been making sense, not coherent".    CSW updated son on nurses station number, number for primary CSW and number for this CSW as a back up.    Cathy [name] 06/15/2022, 3:48 PM

## 2022-06-16 DIAGNOSIS — F332 Major depressive disorder, recurrent severe without psychotic features: Secondary | ICD-10-CM | POA: Diagnosis not present

## 2022-06-16 MED ORDER — POLYETHYLENE GLYCOL 3350 17 G PO PACK
17.0000 g | PACK | Freq: Every day | ORAL | Status: DC
  Administered 2022-06-16 – 2022-07-27 (×41): 17 g via ORAL
  Filled 2022-06-16 (×41): qty 1

## 2022-06-16 NOTE — Progress Notes (Signed)
[facility] MD Progress Note  06/16/2022 2:13 PM Cathy [name]  MRN:  [id number] Subjective: Cathy [name] is seen on rounds.  She says that she slept well and that she knows no little better.  She was continued on her Effexor from [place], which was recently started and I added Wellbutrin and Zyprexa.  She says that she feels pretty good denies any side effects.  We discussed the need for her to have outpatient appointments.  Principal Problem: Major depressive disorder, recurrent episode, severe (HCC) Diagnosis: Principal Problem:   Major depressive disorder, recurrent episode, severe ([place])  Total Time spent with patient: 15 minutes  Past Psychiatric History: She has been on Lexapro and Trintellix prescribed by her PCP.  No past psychiatric hospitalizations and no past suicide attempts.  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   Vision changes     Past Surgical History:  Procedure Laterality Date   CHOLECYSTECTOMY     IR IMAGING GUIDED PORT INSERTION  09/21/2020   LITHOTRIPSY  1984   LYMPH NODE BIOPSY     MASS BIOPSY Left 09/03/2020   Procedure: LEFT NECK JUGULAR NODE;  Surgeon: Rozetta Nunnery, MD;  Location: [place];  Service: ENT;  Laterality: Left;   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   Procedure: LEFT TOTAL KNEE ARTHROPLASTY;  Surgeon: Gaynelle Arabian, MD;  Location: [facility];  Service: Orthopedics;  Laterality: Left;   Family History:   Family History  Problem Relation Age of Onset   Hypertension Mother    Stroke Mother    Heart attack Mother        CABG, 5 Stents   Dementia Mother    Cancer Father        Kidney   Asthma Sister    Diabetes Brother        Borderline   Alcohol abuse Brother    Cancer Maternal Aunt        stomach ca   Cancer Maternal Aunt        ovarian ca   Breast cancer Cousin    Colon cancer Neg Hx    Family Psychiatric  History: Unremarkable Social History:  Social History   Substance and Sexual Activity  Alcohol Use No     Social History   Substance and Sexual Activity  Drug Use No    Social History   Socioeconomic History   Marital status: Widowed    Spouse name: Not on file   Number of children: Not on file   Years of education: Not on file   Highest education level: Not on file  Occupational History   Not on file  Tobacco Use   Smoking status: Never   Smokeless tobacco: Never  Substance and Sexual Activity   Alcohol use: No   Drug use: No   Sexual activity: Not Currently  Birth control/protection: Post-menopausal  Other Topics Concern   Not on file  Social History Narrative   Lives alone.     Son is [name].     Social Determinants of Health   Financial Resource Strain: Not on file  Food Insecurity: No Food Insecurity (06/14/2022)   Hunger Vital Sign    Worried About Running Out of Food in the Last Year: Never true    Ran Out of Food in the Last Year: Never true  Transportation Needs: No Transportation Needs (06/14/2022)   PRAPARE - Hydrologist (Medical): No    Lack of Transportation (Non-Medical): No  Physical Activity: Not on file  Stress: Not on file  Social Connections: Not on file   Additional Social History:                         Sleep: Good  Appetite:  Good  Current Medications: Current Facility-Administered Medications  Medication Dose Route Frequency Provider Last Rate Last Admin    acetaminophen (TYLENOL) tablet 650 mg  650 mg Oral Q6H PRN Parks Ranger, DO       alum & mag hydroxide-simeth (MAALOX/MYLANTA) 200-200-20 MG/5ML suspension 30 mL  30 mL Oral Q4H PRN Parks Ranger, DO       aspirin EC tablet 81 mg  81 mg Oral Daily Parks Ranger, DO   81 mg at 06/16/22 0913   atorvastatin (LIPITOR) tablet 20 mg  20 mg Oral Daily Parks Ranger, DO   20 mg at 06/16/22 0913   buPROPion (WELLBUTRIN XL) 24 hr tablet 150 mg  150 mg Oral Daily Parks Ranger, DO   150 mg at 06/16/22 3300   cholecalciferol (VITAMIN D3) 25 MCG (1000 UNIT) tablet 1,000 Units  1,000 Units Oral Daily Parks Ranger, DO   1,000 Units at 06/16/22 0913   cyanocobalamin (VITAMIN B12) tablet 1,000 mcg  1,000 mcg Oral Daily Parks Ranger, DO   1,000 mcg at 06/16/22 7622   hydrOXYzine (ATARAX) tablet 10 mg  10 mg Oral Q8H PRN Parks Ranger, DO   10 mg at 06/14/22 2141   losartan (COZAAR) tablet 25 mg  25 mg Oral Daily Parks Ranger, DO   25 mg at 06/16/22 0913   magnesium hydroxide (MILK OF MAGNESIA) suspension 30 mL  30 mL Oral Daily PRN Parks Ranger, DO   30 mL at 06/15/22 0815   metoprolol succinate (TOPROL-XL) 24 hr tablet 25 mg  25 mg Oral Daily Parks Ranger, DO   25 mg at 06/16/22 0913   multivitamin with minerals tablet 1 tablet  1 tablet Oral Daily Parks Ranger, DO   1 tablet at 06/16/22 0913   OLANZapine (ZYPREXA) tablet 5 mg  5 mg Oral QHS Parks Ranger, DO   5 mg at 06/15/22 2056   pantoprazole (PROTONIX) EC tablet 40 mg  40 mg Oral Daily Parks Ranger, DO   40 mg at 06/16/22 0913   polyethylene glycol (MIRALAX / GLYCOLAX) packet 17 g  17 g Oral Daily Parks Ranger, DO   17 g at 06/16/22 1150   traZODone (DESYREL) tablet 50 mg  50 mg Oral QHS PRN Parks Ranger, DO   50 mg at 06/15/22 2057   venlafaxine XR (EFFEXOR-XR) 24 hr capsule 150 mg  150 mg  Oral Q breakfast Parks Ranger, DO   150 mg at  06/16/22 0859    Lab Results: No results found for this or any previous visit (from the past 48 hour(s)).  Blood Alcohol level:  Lab Results  Component Value Date   ETH <10 59/93/5701    Metabolic Disorder Labs: Lab Results  Component Value Date   HGBA1C 5.5 06/10/2022   MPG 111.15 06/10/2022   MPG 105 04/05/2021   No results found for: "PROLACTIN" Lab Results  Component Value Date   CHOL 246 (H) 06/10/2022   TRIG 94 06/10/2022   HDL 57 06/10/2022   CHOLHDL 4.3 06/10/2022   VLDL 19 06/10/2022   LDLCALC 170 (H) 06/10/2022   LDLCALC 67 04/05/2021    Physical Findings: AIMS:  , ,  ,  ,    CIWA:    COWS:     Musculoskeletal: Strength & Muscle Tone: within normal limits Gait & Station: normal Patient leans: N/A  Psychiatric Specialty Exam:  Presentation  General Appearance:  Appropriate for Environment; Casual  Eye Contact: Good  Speech: Clear and Coherent; Normal Rate  Speech Volume: Normal  Handedness: Right   Mood and Affect  Mood: Dysphoric  Affect: Congruent   Thought Process  Thought Processes: Coherent  Descriptions of Associations:Intact  Orientation:Full (Time, Place and Person)  Thought Content:Logical  History of Schizophrenia/Schizoaffective disorder:No  Duration of Psychotic Symptoms:No data recorded Hallucinations:No data recorded Ideas of Reference:None  Suicidal Thoughts:No data recorded Homicidal Thoughts:No data recorded  Sensorium  Memory: Immediate Good; Recent Good; Remote Good  Judgment: Good  Insight: Good   Executive Functions  Concentration: Good  Attention Span: Good  Recall: Good  Fund of Knowledge: Good  Language: Good   Psychomotor Activity  Psychomotor Activity:No data recorded  Assets  Assets: Physical Health; Resilience; Social Support; Catering manager; Housing   Sleep  Sleep:No data  recorded   Physical Exam: Physical Exam Vitals and nursing note reviewed.  Constitutional:      Appearance: Normal appearance. She is normal weight.  Neurological:     General: No focal deficit present.     Mental Status: She is alert and oriented to person, place, and time.  Psychiatric:        Attention and Perception: Attention and perception normal.        Mood and Affect: Mood is depressed. Affect is flat.        Speech: Speech normal.        Behavior: Behavior normal. Behavior is cooperative.        Thought Content: Thought content normal.        Cognition and Memory: Cognition and memory normal.        Judgment: Judgment normal.    Review of Systems  Constitutional: Negative.   HENT: Negative.    Eyes: Negative.   Respiratory: Negative.    Cardiovascular: Negative.   Gastrointestinal: Negative.   Genitourinary: Negative.   Musculoskeletal: Negative.   Skin: Negative.   Neurological: Negative.   Endo/Heme/Allergies: Negative.   Psychiatric/Behavioral:  Positive for depression.    Blood pressure 117/64, pulse 94, temperature (!) 97.4 F (36.3 C), resp. rate 14, height '5\' 2"'$  (1.575 m), SpO2 100 %. Body mass index is 20.01 kg/m.   Treatment Plan Summary: Daily contact with patient to assess and evaluate symptoms and progress in treatment, Medication management, and Plan continue current medications.  Parks Ranger, DO 06/16/2022, 2:13 PM

## 2022-06-16 NOTE — Progress Notes (Signed)
   06/16/22 0600  Psych Admission Type (Psych Patients Only)  Admission Status Involuntary  Psychosocial Assessment  Patient Complaints None  Eye Contact Fair  Facial Expression Animated  Affect Appropriate to circumstance  Speech Logical/coherent  Interaction Minimal  Motor Activity Slow  Appearance/Hygiene Unremarkable  Behavior Characteristics Appropriate to situation;Cooperative;Calm  Mood Pleasant  Thought Process  Coherency WDL  Content WDL  Delusions None reported or observed  Perception WDL  Hallucination None reported or observed  Judgment WDL  Confusion None  Danger to Self  Current suicidal ideation? Denies  Description of Suicide Plan none  Self-Injurious Behavior No self-injurious ideation or behavior indicators observed or expressed   Agreement Not to Harm Self Yes  Description of Agreement verbal  Danger to Others  Danger to Others None reported or observed

## 2022-06-16 NOTE — Group Note (Signed)
LCSW Group Therapy Note   Group Date: 06/16/2022 Start Time: 1300 End Time: 1400  LCSW Group Therapy Note         Type of Therapy/Topic:  Group Therapy: Emotional Regulation        Participation Level:  Did not attend      Description of Group:     Group participated in activity to utilize coping skills to help deal with strong emotions. Positive recreational activity and mindfulness were practiced to assist with distraction/amelioration of negative emotional outbursts and overload.        Therapeutic Goals:   1. Patient will identify positive activities to help distract/ameliorate negative emotions.   2. Patient will demonstrate positive leisure activity/mindfulness practice.       Summary of Patient Progress:  X        Therapeutic Modalities:   Mindfulness   Positive leisure   Jenniferann Stuckert [place], MSW, LCSW-A 1/4/20243:10 PM

## 2022-06-16 NOTE — Plan of Care (Signed)
Pt endorses anxiety however denies depression at this time. Pt denies SI/HI/AVH or pain at this time. Pt is calm and cooperative. Pt is medication compliant. Pt provided with support and encouragement. Pt monitored q15 minutes for safety per unit policy. Plan of care ongoing.   Pt has c/o constipation. MD made aware of pt's constipation and what pt states she takes at home daily for constipation. MD placed order for miralax, which was given.  Problem: Education: Goal: Knowledge of General Education information will improve Description: Including pain rating scale, medication(s)/side effects and non-pharmacologic comfort measures Outcome: Progressing   Problem: Coping: Goal: Level of anxiety will decrease Outcome: Not Progressing

## 2022-06-17 DIAGNOSIS — F332 Major depressive disorder, recurrent severe without psychotic features: Secondary | ICD-10-CM | POA: Diagnosis not present

## 2022-06-17 NOTE — Progress Notes (Signed)
Patient has stayed in her room with minimum interactions with staff and peers. She was receptive upon approach. Disoriented to situations. Patient denied SI/HI/AVH. Patient did not want to eat HS snack. She attempted to hide her medication in her pocket and was reeducated about the importance of taking her medications as prescribed. Patient took her medication per encouragements.  Safety precautions reinforced.

## 2022-06-17 NOTE — Plan of Care (Signed)
Pt denies anxiety/depression at this time. Pt denies SI/HI/AVH or pain at this time. Pt is calm and cooperative. Pt is medication compliant. Pt provided with support and encouragement. Pt monitored q15 minutes for safety per unit policy. Plan of care ongoing.   Pt put her coat on and was waiting by the entrance doors to the unit and when redirected and asked if she was okay, how could we help her. Pt stated she was leaving with [place] (the RN's) and was ready to leave. Pt informed we (RN's) were not leaving and that she could not go with [place]. Pt appears confused at times. Pt became upset and tearful late this afternoon. Pt even torn up her pillow.  Problem: Nutrition: Goal: Adequate nutrition will be maintained Outcome: Progressing   Problem: Pain Managment: Goal: General experience of comfort will improve Outcome: Progressing

## 2022-06-17 NOTE — Progress Notes (Signed)
[facility] MD Progress Note  06/17/2022 10:08 AM Cathy [name]  MRN:  [id number] Subjective: Cathy [name] is seen on rounds.  She says that she did not sleep as well last night.  She has been compliant with medications and denies any side effects.  I continued Effexor XR that was started at [facility] and started her on Zyprexa at bedtime and Wellbutrin in the morning for her lack of motivation and depression.  So far she does not have any problems with him.  She did get 50 mg of trazodone as needed last night.  Her anxiety started shortly after lymphoma treatment.  Principal Problem: Major depressive disorder, recurrent episode, severe (HCC) Diagnosis: Principal Problem:   Major depressive disorder, recurrent episode, severe ([place])  Total Time spent with patient: 15 minutes  Past Psychiatric History:  She has been on Lexapro and Trintellix prescribed by her PCP.  No past psychiatric hospitalizations and no past suicide attempts.   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   Vision changes     Past Surgical History:  Procedure Laterality Date   CHOLECYSTECTOMY     IR IMAGING GUIDED PORT INSERTION  09/21/2020   LITHOTRIPSY  1984   LYMPH NODE BIOPSY     MASS BIOPSY Left 09/03/2020   Procedure: LEFT NECK JUGULAR NODE;  Surgeon: Rozetta Nunnery, MD;  Location: [place];  Service: ENT;  Laterality: Left;   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    Procedure: LEFT TOTAL KNEE ARTHROPLASTY;  Surgeon: Gaynelle Arabian, MD;  Location: [facility];  Service: Orthopedics;  Laterality: Left;   Family History:  Family History  Problem Relation Age of Onset   Hypertension Mother    Stroke Mother    Heart attack Mother        CABG, 5 Stents   Dementia Mother    Cancer Father        Kidney   Asthma Sister    Diabetes Brother        Borderline   Alcohol abuse Brother    Cancer Maternal Aunt        stomach ca   Cancer Maternal Aunt        ovarian ca   Breast cancer Cousin    Colon cancer Neg Hx    Family Psychiatric  History: Unremarkable Social History:  Social History   Substance and Sexual Activity  Alcohol Use No     Social History   Substance and Sexual Activity  Drug Use No    Social History   Socioeconomic History   Marital status: Widowed    Spouse name: Not on file   Number of children: Not on file   Years of education: Not on file   Highest education level: Not on file  Occupational History   Not on file  Tobacco Use   Smoking  status: Never   Smokeless tobacco: Never  Substance and Sexual Activity   Alcohol use: No   Drug use: No   Sexual activity: Not Currently    Birth control/protection: Post-menopausal  Other Topics Concern   Not on file  Social History Narrative   Lives alone.     Son is Dr. Margaretmary [name].     Social Determinants of Health   Financial Resource Strain: Not on file  Food Insecurity: No Food Insecurity (06/14/2022)   Hunger Vital Sign    Worried About Running Out of Food in the Last Year: Never true    Ran Out of Food in the Last Year: Never true  Transportation Needs: No Transportation Needs (06/14/2022)   PRAPARE - Hydrologist (Medical): No    Lack of Transportation (Non-Medical): No  Physical Activity: Not on file  Stress: Not on file  Social Connections: Not on file   Additional Social History:                         Sleep:  Good  Appetite:  Good  Current Medications: Current Facility-Administered Medications  Medication Dose Route Frequency Provider Last Rate Last Admin   acetaminophen (TYLENOL) tablet 650 mg  650 mg Oral Q6H PRN Parks Ranger, DO       alum & mag hydroxide-simeth (MAALOX/MYLANTA) 200-200-20 MG/5ML suspension 30 mL  30 mL Oral Q4H PRN Parks Ranger, DO       aspirin EC tablet 81 mg  81 mg Oral Daily Parks Ranger, DO   81 mg at 06/17/22 0919   atorvastatin (LIPITOR) tablet 20 mg  20 mg Oral Daily Parks Ranger, DO   20 mg at 06/17/22 0915   buPROPion (WELLBUTRIN XL) 24 hr tablet 150 mg  150 mg Oral Daily Parks Ranger, DO   150 mg at 06/17/22 0915   cholecalciferol (VITAMIN D3) 25 MCG (1000 UNIT) tablet 1,000 Units  1,000 Units Oral Daily Parks Ranger, DO   1,000 Units at 06/17/22 7169   cyanocobalamin (VITAMIN B12) tablet 1,000 mcg  1,000 mcg Oral Daily Parks Ranger, DO   1,000 mcg at 06/17/22 0915   hydrOXYzine (ATARAX) tablet 10 mg  10 mg Oral Q8H PRN Parks Ranger, DO   10 mg at 06/14/22 2141   losartan (COZAAR) tablet 25 mg  25 mg Oral Daily Parks Ranger, DO   25 mg at 06/17/22 6789   magnesium hydroxide (MILK OF MAGNESIA) suspension 30 mL  30 mL Oral Daily PRN Parks Ranger, DO   30 mL at 06/15/22 0815   metoprolol succinate (TOPROL-XL) 24 hr tablet 25 mg  25 mg Oral Daily Parks Ranger, DO   25 mg at 06/17/22 0919   multivitamin with minerals tablet 1 tablet  1 tablet Oral Daily Parks Ranger, DO   1 tablet at 06/17/22 0914   OLANZapine (ZYPREXA) tablet 5 mg  5 mg Oral QHS Parks Ranger, DO   5 mg at 06/16/22 2122   pantoprazole (PROTONIX) EC tablet 40 mg  40 mg Oral Daily Parks Ranger, DO   40 mg at 06/17/22 0914   polyethylene glycol (MIRALAX / GLYCOLAX) packet 17 g  17 g Oral Daily Parks Ranger, DO   17 g at 06/17/22 0914   traZODone  (DESYREL) tablet 50 mg  50 mg Oral QHS PRN Parks Ranger, DO  50 mg at 06/16/22 2122   venlafaxine XR (EFFEXOR-XR) 24 hr capsule 150 mg  150 mg Oral Q breakfast Parks Ranger, DO   150 mg at 06/17/22 1829    Lab Results: No results found for this or any previous visit (from the past 48 hour(s)).  Blood Alcohol level:  Lab Results  Component Value Date   ETH <10 93/71/6967    Metabolic Disorder Labs: Lab Results  Component Value Date   HGBA1C 5.5 06/10/2022   MPG 111.15 06/10/2022   MPG 105 04/05/2021   No results found for: "PROLACTIN" Lab Results  Component Value Date   CHOL 246 (H) 06/10/2022   TRIG 94 06/10/2022   HDL 57 06/10/2022   CHOLHDL 4.3 06/10/2022   VLDL 19 06/10/2022   LDLCALC 170 (H) 06/10/2022   LDLCALC 67 04/05/2021    Physical Findings: AIMS:  , ,  ,  ,    CIWA:    COWS:     Musculoskeletal: Strength & Muscle Tone: within normal limits Gait & Station: normal Patient leans: N/A  Psychiatric Specialty Exam:  Presentation  General Appearance:  Appropriate for Environment; Casual  Eye Contact: Good  Speech: Clear and Coherent; Normal Rate  Speech Volume: Normal  Handedness: Right   Mood and Affect  Mood: Dysphoric  Affect: Congruent   Thought Process  Thought Processes: Coherent  Descriptions of Associations:Intact  Orientation:Full (Time, Place and Person)  Thought Content:Logical  History of Schizophrenia/Schizoaffective disorder:No  Duration of Psychotic Symptoms:No data recorded Hallucinations:No data recorded Ideas of Reference:None  Suicidal Thoughts:No data recorded Homicidal Thoughts:No data recorded  Sensorium  Memory: Immediate Good; Recent Good; Remote Good  Judgment: Good  Insight: Good   Executive Functions  Concentration: Good  Attention Span: Good  Recall: Good  Fund of Knowledge: Good  Language: Good   Psychomotor Activity  Psychomotor Activity:No data  recorded  Assets  Assets: Physical Health; Resilience; Social Support; Catering manager; Housing   Sleep  Sleep:No data recorded   Physical Exam: Physical Exam Vitals and nursing note reviewed.  Constitutional:      Appearance: Normal appearance. She is normal weight.  Neurological:     General: No focal deficit present.     Mental Status: She is alert and oriented to person, place, and time.  Psychiatric:        Attention and Perception: Attention and perception normal.        Mood and Affect: Mood is anxious and depressed. Affect is flat.        Speech: Speech normal.        Behavior: Behavior normal. Behavior is cooperative.        Thought Content: Thought content normal.        Cognition and Memory: Cognition and memory normal.        Judgment: Judgment normal.    Review of Systems  Constitutional: Negative.   HENT: Negative.    Eyes: Negative.   Respiratory: Negative.    Cardiovascular: Negative.   Gastrointestinal: Negative.   Genitourinary: Negative.   Musculoskeletal: Negative.   Skin: Negative.   Neurological: Negative.   Endo/Heme/Allergies: Negative.   Psychiatric/Behavioral:  Positive for depression. The patient is nervous/anxious and has insomnia.    Blood pressure 113/71, pulse 96, temperature 98.3 F (36.8 C), temperature source Oral, resp. rate 18, height '5\' 2"'$  (1.575 m), SpO2 99 %. Body mass index is 20.01 kg/m.   Treatment Plan Summary: Daily contact with patient to assess and evaluate symptoms and  progress in treatment, Medication management, and Plan continue current medications.  [place], DO 06/17/2022, 10:08 AM

## 2022-06-17 NOTE — Progress Notes (Signed)
   06/17/22 0200  Psych Admission Type (Psych Patients Only)  Admission Status Involuntary  Psychosocial Assessment  Patient Complaints Anxiety  Eye Contact Fair  Facial Expression Anxious  Affect Appropriate to circumstance  Speech Logical/coherent  Interaction Minimal  Motor Activity Slow  Appearance/Hygiene Unremarkable  Behavior Characteristics Cooperative;Appropriate to situation  Mood Pleasant  Thought Process  Coherency WDL  Content WDL  Delusions None reported or observed  Perception WDL  Hallucination None reported or observed  Judgment WDL  Confusion None  Danger to Self  Current suicidal ideation? Denies  Self-Injurious Behavior No self-injurious ideation or behavior indicators observed or expressed   Agreement Not to Harm Self Yes  Description of Agreement verbal  Danger to Others  Danger to Others None reported or observed

## 2022-06-18 ENCOUNTER — Inpatient Hospital Stay: Payer: Medicare PPO

## 2022-06-18 ENCOUNTER — Inpatient Hospital Stay: Payer: Self-pay

## 2022-06-18 DIAGNOSIS — F332 Major depressive disorder, recurrent severe without psychotic features: Secondary | ICD-10-CM | POA: Diagnosis not present

## 2022-06-18 NOTE — Progress Notes (Signed)
The patient approached the nurses station around 0105 rubbing her forehead. She did not state that she had fallen, but when asked she stated that she tripped. When assessed, there was some visible bruising starting, and potential busted right upper lip. The NP was notified and a stat order was placed for a head CT. CT has not been completed at this time, radiology has been called ([id number]). The patient does not voice any other concerns at this time.

## 2022-06-18 NOTE — Progress Notes (Signed)
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 Patient redirected to her room, now in bed resting

## 2022-06-18 NOTE — Progress Notes (Signed)
Patient with anxious, worried affect.  Patient is tearful and worried about her son.  Denies SI/HI and AVH.  Patient taken to CT scan after a fall last night.  No acute findings.  Patient compliant with scheduled medications after strong encouragement from staff.  15 min checks in place.  Yellow armband placed on patient.  Patient is at the nurse's station often - needs a lot of reassurance.   Patient's VS stable.  Patient is at baseline.  NAD.  No neurological symptoms.

## 2022-06-18 NOTE — Plan of Care (Signed)
  Problem: Activity: Goal: Risk for activity intolerance will decrease Outcome: Progressing   Problem: Coping: Goal: Level of anxiety will decrease Outcome: Not Progressing   Problem: Pain Managment: Goal: General experience of comfort will improve Outcome: Not Progressing   Problem: Safety: Goal: Ability to remain free from injury will improve Outcome: Not Progressing

## 2022-06-18 NOTE — Progress Notes (Signed)
Patient required frequent redirection to find her room.  Patient walked into other patient rooms numerous times throughout the shift.

## 2022-06-18 NOTE — Progress Notes (Addendum)
Patient fell in dayroom.  Patient reports she did not hit her head.  Patient stated she went to sit down,  but there was no chair.   VS stable. NAD.  Patient wearing yellow fall risk band. Dr. Weber Cooks made aware and is at bedside.  Patient did not want her son notified. "He already knows I fell last night."   Patient does NOT have a legal guardian. [facility] AC notified.  Orthostatic BP will be obtained.    Patient educated on the importance of changing positions slowly.

## 2022-06-18 NOTE — Progress Notes (Signed)
[facility] MD Progress Note  06/18/2022 3:02 PM Cathy [name]  MRN:  [id number] Subjective: Follow-up 77 year old woman.  Diagnosis of major depression.  Patient appears to be agitated today.  She is on her feet almost constantly pacing around somewhat intrusive.  In interview with me she is speaking rapidly and seems to be paranoid and confused.  She has had 2 falls in the last day including 1 of which where she hit her head last night.  Follow-up CT scan shows no damage.  Blood pressure a little on the low side. Principal Problem: Major depressive disorder, recurrent episode, severe ([place]) Diagnosis: Principal Problem:   Major depressive disorder, recurrent episode, severe ([place])  Total Time spent with patient: 30 minutes  Past Psychiatric History: Past history of depression  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   Vision changes     Past Surgical History:  Procedure Laterality Date   CHOLECYSTECTOMY     IR IMAGING GUIDED PORT INSERTION  09/21/2020   LITHOTRIPSY  1984   LYMPH NODE BIOPSY     MASS BIOPSY Left 09/03/2020   Procedure: LEFT NECK JUGULAR NODE;  Surgeon: Rozetta Nunnery, MD;  Location: [place];  Service: ENT;  Laterality: Left;   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   Procedure: LEFT TOTAL KNEE ARTHROPLASTY;  Surgeon: Gaynelle Arabian, MD;  Location: [facility];  Service: Orthopedics;  Laterality: Left;   Family History:   Family History  Problem Relation Age of Onset   Hypertension Mother    Stroke Mother    Heart attack Mother        CABG, 5 Stents   Dementia Mother    Cancer Father        Kidney   Asthma Sister    Diabetes Brother        Borderline   Alcohol abuse Brother    Cancer Maternal Aunt        stomach ca   Cancer Maternal Aunt        ovarian ca   Breast cancer Cousin    Colon cancer Neg Hx    Family Psychiatric  History: See previous Social History:  Social History   Substance and Sexual Activity  Alcohol Use No     Social History   Substance and Sexual Activity  Drug Use No    Social History   Socioeconomic History   Marital status: Widowed    Spouse name: Not on file   Number of children: Not on file   Years of education: Not on file   Highest education level: Not on file  Occupational History   Not on file  Tobacco Use   Smoking status: Never   Smokeless tobacco: Never  Substance and Sexual Activity   Alcohol use: No   Drug use: No   Sexual activity: Not Currently  Birth control/protection: Post-menopausal  Other Topics Concern   Not on file  Social History Narrative   Lives alone.     Son is [name].     Social Determinants of Health   Financial Resource Strain: Not on file  Food Insecurity: No Food Insecurity (06/14/2022)   Hunger Vital Sign    Worried About Running Out of Food in the Last Year: Never true    Ran Out of Food in the Last Year: Never true  Transportation Needs: No Transportation Needs (06/14/2022)   PRAPARE - Hydrologist (Medical): No    Lack of Transportation (Non-Medical): No  Physical Activity: Not on file  Stress: Not on file  Social Connections: Not on file   Additional Social History:                         Sleep: Fair  Appetite:  Fair  Current Medications: Current Facility-Administered Medications  Medication Dose Route Frequency Provider Last Rate Last Admin    acetaminophen (TYLENOL) tablet 650 mg  650 mg Oral Q6H PRN Parks Ranger, DO       alum & mag hydroxide-simeth (MAALOX/MYLANTA) 200-200-20 MG/5ML suspension 30 mL  30 mL Oral Q4H PRN Parks Ranger, DO       aspirin EC tablet 81 mg  81 mg Oral Daily Parks Ranger, DO   81 mg at 06/18/22 1011   atorvastatin (LIPITOR) tablet 20 mg  20 mg Oral Daily Parks Ranger, DO   20 mg at 06/18/22 1011   cholecalciferol (VITAMIN D3) 25 MCG (1000 UNIT) tablet 1,000 Units  1,000 Units Oral Daily Parks Ranger, DO   1,000 Units at 06/18/22 1012   cyanocobalamin (VITAMIN B12) tablet 1,000 mcg  1,000 mcg Oral Daily Parks Ranger, DO   1,000 mcg at 06/18/22 1012   hydrOXYzine (ATARAX) tablet 10 mg  10 mg Oral Q8H PRN Parks Ranger, DO   10 mg at 06/14/22 2141   losartan (COZAAR) tablet 25 mg  25 mg Oral Daily Parks Ranger, DO   25 mg at 06/18/22 1012   magnesium hydroxide (MILK OF MAGNESIA) suspension 30 mL  30 mL Oral Daily PRN Parks Ranger, DO   30 mL at 06/15/22 0815   multivitamin with minerals tablet 1 tablet  1 tablet Oral Daily Parks Ranger, DO   1 tablet at 06/18/22 1011   OLANZapine (ZYPREXA) tablet 5 mg  5 mg Oral QHS Parks Ranger, DO   5 mg at 06/17/22 2141   pantoprazole (PROTONIX) EC tablet 40 mg  40 mg Oral Daily Parks Ranger, DO   40 mg at 06/18/22 1012   polyethylene glycol (MIRALAX / GLYCOLAX) packet 17 g  17 g Oral Daily Parks Ranger, DO   17 g at 06/18/22 1012   traZODone (DESYREL) tablet 50 mg  50 mg Oral QHS PRN Parks Ranger, DO   50 mg at 06/16/22 2122   venlafaxine XR (EFFEXOR-XR) 24 hr capsule 150 mg  150 mg Oral Q breakfast Parks Ranger, DO   150 mg at 06/18/22 1012    Lab Results: No results found for this or any previous visit (from the past 48 hour(s)).  Blood Alcohol level:  Lab Results  Component Value Date   ETH <10 29/47/6546     Metabolic Disorder Labs: Lab Results  Component Value Date   HGBA1C  5.5 06/10/2022   MPG 111.15 06/10/2022   MPG 105 04/05/2021   No results found for: "PROLACTIN" Lab Results  Component Value Date   CHOL 246 (H) 06/10/2022   TRIG 94 06/10/2022   HDL 57 06/10/2022   CHOLHDL 4.3 06/10/2022   VLDL 19 06/10/2022   LDLCALC 170 (H) 06/10/2022   LDLCALC 67 04/05/2021    Physical Findings: AIMS:  , ,  ,  ,    CIWA:    COWS:     Musculoskeletal: Strength & Muscle Tone: within normal limits Gait & Station: normal Patient leans: N/A  Psychiatric Specialty Exam:  Presentation  General Appearance:  Appropriate for Environment; Casual  Eye Contact: Good  Speech: Clear and Coherent; Normal Rate  Speech Volume: Normal  Handedness: Right   Mood and Affect  Mood: Dysphoric  Affect: Congruent   Thought Process  Thought Processes: Coherent  Descriptions of Associations:Intact  Orientation:Full (Time, Place and Person)  Thought Content:Logical  History of Schizophrenia/Schizoaffective disorder:No  Duration of Psychotic Symptoms:No data recorded Hallucinations:No data recorded Ideas of Reference:None  Suicidal Thoughts:No data recorded Homicidal Thoughts:No data recorded  Sensorium  Memory: Immediate Good; Recent Good; Remote Good  Judgment: Good  Insight: Good   Executive Functions  Concentration: Good  Attention Span: Good  Recall: Good  Fund of Knowledge: Good  Language: Good   Psychomotor Activity  Psychomotor Activity:No data recorded  Assets  Assets: Physical Health; Resilience; Social Support; Catering manager; Housing   Sleep  Sleep:No data recorded   Physical Exam: Physical Exam Vitals and nursing note reviewed.  Constitutional:      Appearance: Normal appearance.  HENT:     Head: Normocephalic and atraumatic.      Mouth/Throat:     Pharynx: Oropharynx is clear.  Eyes:     Pupils:  Pupils are equal, round, and reactive to light.  Cardiovascular:     Rate and Rhythm: Normal rate and regular rhythm.  Pulmonary:     Effort: Pulmonary effort is normal.     Breath sounds: Normal breath sounds.  Abdominal:     General: Abdomen is flat.     Palpations: Abdomen is soft.  Musculoskeletal:        General: Normal range of motion.  Skin:    General: Skin is warm and dry.  Neurological:     General: No focal deficit present.     Mental Status: She is alert. Mental status is at baseline.  Psychiatric:        Attention and Perception: She is inattentive.        Mood and Affect: Mood is anxious.        Speech: Speech is rapid and pressured.        Behavior: Behavior is agitated. Behavior is not aggressive.        Thought Content: Thought content is paranoid.    Review of Systems  Constitutional: Negative.   HENT: Negative.    Eyes: Negative.   Respiratory: Negative.    Cardiovascular: Negative.   Gastrointestinal: Negative.   Musculoskeletal: Negative.   Skin: Negative.   Neurological: Negative.   Psychiatric/Behavioral:  Negative for depression, hallucinations, substance abuse and suicidal ideas. The patient is nervous/anxious.    Blood pressure 120/62, pulse 88, temperature 98.1 F (36.7 C), resp. rate 15, height '5\' 2"'$  (1.575 m), SpO2 100 %. Body mass index is 20.01 kg/m.   Treatment Plan Summary: Medication management and Plan patient has had a couple of falls.  She does  not look unsteady or dizzy and does not complain of being dizzy.  She did just start the Wellbutrin which has been the most remarkable change in medicine.  Looks like she could possibly have some hypomanic symptoms right now versus just anxiety.  I am going to hold the Wellbutrin for now and also hold the metoprolol to minimize risk of falls.  No other change at this time.  Staff aware of her being a falls risk.  Alethia Berthold, MD 06/18/2022, 3:02 PM

## 2022-06-18 NOTE — Progress Notes (Signed)
Per report,  patient had fallen last night (unwitnessed).  Patient has a bump on her forehead.  CT scan had not been ordered.  Dr. Weber Cooks called and gave verbal consent for CT scan order.  Patient taken to CT by NT.

## 2022-06-18 NOTE — [facility] Group Notes (Signed)
[facility] Group Notes:  (Nursing/MHT/Case Management/Adjunct)  Date:  06/18/2022  Time:  3:16 PM  Type of Therapy:   BINGO  Participation Level:  Did Not Attend  Participation Quality:    Affect:    Cognitive:    Insight:    Engagement in Group:    Modes of Intervention:    Summary of Progress/Problems:  Bois[place], Amy T 06/18/2022, 3:16 PM

## 2022-06-19 DIAGNOSIS — F332 Major depressive disorder, recurrent severe without psychotic features: Secondary | ICD-10-CM | POA: Diagnosis not present

## 2022-06-19 NOTE — Progress Notes (Signed)
Pt on 1:1 for safety related to poor safety awareness, unsteady gate.

## 2022-06-19 NOTE — Plan of Care (Signed)
Patient is 1:1 for fall risk. Pt denies SI / HI / AVH.  Problem: Education: Goal: Knowledge of General Education information will improve Description: Including pain rating scale, medication(s)/side effects and non-pharmacologic comfort measures Outcome: Not Progressing   Problem: Health Behavior/Discharge Planning: Goal: Ability to manage health-related needs will improve Outcome: Not Progressing   Problem: Clinical Measurements: Goal: Ability to maintain clinical measurements within normal limits will improve Outcome: Not Progressing

## 2022-06-19 NOTE — Progress Notes (Signed)
Patient woke up this morning with unsteady gait and walking very fast thinking she is getting late to school. Patient appears confused and started getting irritable with redirection. Patient has stumbled on her own feet several time and not able to making turn without losing balance. Patient is very impulsive and clearly a fall risk. NP has been notified and gave an order for one on one observation level.

## 2022-06-19 NOTE — Progress Notes (Signed)
[facility] MD Progress Note  06/19/2022 2:59 PM Cathy [name]  MRN:  [id number] Subjective: Patient seen for follow-up.  77 year old woman with reported history of depression.  Yesterday she seemed more agitated today she is anxious but has calm down.  Spending more time in bed.  Appears very nervous.  Denies suicidal thoughts.  Seems a little scattered in her memory. Principal Problem: Major depressive disorder, recurrent episode, severe ([place]) Diagnosis: Principal Problem:   Major depressive disorder, recurrent episode, severe ([place])  Total Time spent with patient: 30 minutes  Past Psychiatric History: Past history of recurrent mood symptoms  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   Vision changes     Past Surgical History:  Procedure Laterality Date   CHOLECYSTECTOMY     IR IMAGING GUIDED PORT INSERTION  09/21/2020   LITHOTRIPSY  1984   LYMPH NODE BIOPSY     MASS BIOPSY Left 09/03/2020   Procedure: LEFT NECK JUGULAR NODE;  Surgeon: Rozetta Nunnery, MD;  Location: [place];  Service: ENT;  Laterality: Left;   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   Procedure: LEFT TOTAL KNEE ARTHROPLASTY;  Surgeon: Gaynelle Arabian, MD;  Location: [facility];  Service: Orthopedics;  Laterality: Left;   Family History:  Family History  Problem Relation Age of Onset   Hypertension Mother    Stroke Mother    Heart attack Mother        CABG, 5 Stents    Dementia Mother    Cancer Father        Kidney   Asthma Sister    Diabetes Brother        Borderline   Alcohol abuse Brother    Cancer Maternal Aunt        stomach ca   Cancer Maternal Aunt        ovarian ca   Breast cancer Cousin    Colon cancer Neg Hx    Family Psychiatric  History: See previous Social History:  Social History   Substance and Sexual Activity  Alcohol Use No     Social History   Substance and Sexual Activity  Drug Use No    Social History   Socioeconomic History   Marital status: Widowed    Spouse name: Not on file   Number of children: Not on file   Years of education: Not on file   Highest education level: Not on file  Occupational History   Not on file  Tobacco Use   Smoking status: Never   Smokeless tobacco: Never  Substance and Sexual Activity   Alcohol use: No   Drug use: No   Sexual activity: Not Currently    Birth control/protection: Post-menopausal  Other Topics Concern   Not on file  Social History Narrative   Lives alone.     Son is Dr. [name]  [place].     Social Determinants of Health   Financial Resource Strain: Not on file  Food Insecurity: No Food Insecurity (06/14/2022)   Hunger Vital Sign    Worried About Running Out of Food in the Last Year: Never true    Ran Out of Food in the Last Year: Never true  Transportation Needs: No Transportation Needs (06/14/2022)   PRAPARE - Hydrologist (Medical): No    Lack of Transportation (Non-Medical): No  Physical Activity: Not on file  Stress: Not on file  Social Connections: Not on file   Additional Social History:                         Sleep: Fair  Appetite:  Fair  Current Medications: Current Facility-Administered Medications  Medication Dose Route Frequency Provider Last Rate Last Admin   acetaminophen (TYLENOL) tablet 650 mg  650 mg Oral Q6H PRN Parks Ranger, DO       alum & mag hydroxide-simeth (MAALOX/MYLANTA)  200-200-20 MG/5ML suspension 30 mL  30 mL Oral Q4H PRN Parks Ranger, DO       aspirin EC tablet 81 mg  81 mg Oral Daily Parks Ranger, DO   81 mg at 06/19/22 0847   atorvastatin (LIPITOR) tablet 20 mg  20 mg Oral Daily Parks Ranger, DO   20 mg at 06/19/22 0847   cholecalciferol (VITAMIN D3) 25 MCG (1000 UNIT) tablet 1,000 Units  1,000 Units Oral Daily Parks Ranger, DO   1,000 Units at 06/19/22 0847   cyanocobalamin (VITAMIN B12) tablet 1,000 mcg  1,000 mcg Oral Daily Parks Ranger, DO   1,000 mcg at 06/19/22 0846   hydrOXYzine (ATARAX) tablet 10 mg  10 mg Oral Q8H PRN Parks Ranger, DO   10 mg at 06/19/22 0846   losartan (COZAAR) tablet 25 mg  25 mg Oral Daily Parks Ranger, DO   25 mg at 06/19/22 0847   magnesium hydroxide (MILK OF MAGNESIA) suspension 30 mL  30 mL Oral Daily PRN Parks Ranger, DO   30 mL at 06/15/22 0815   multivitamin with minerals tablet 1 tablet  1 tablet Oral Daily Parks Ranger, DO   1 tablet at 06/19/22 0847   OLANZapine (ZYPREXA) tablet 5 mg  5 mg Oral QHS Parks Ranger, DO   5 mg at 06/18/22 2119   pantoprazole (PROTONIX) EC tablet 40 mg  40 mg Oral Daily Parks Ranger, DO   40 mg at 06/19/22 0847   polyethylene glycol (MIRALAX / GLYCOLAX) packet 17 g  17 g Oral Daily Parks Ranger, DO   17 g at 06/19/22 0851   traZODone (DESYREL) tablet 50 mg  50 mg Oral QHS PRN Parks Ranger, DO   50 mg at 06/16/22 2122   venlafaxine XR (EFFEXOR-XR) 24 hr capsule 150 mg  150 mg Oral Q breakfast Parks Ranger, DO   150 mg at 06/19/22 2202    Lab Results: No results found for this or any previous visit (from the past 48 hour(s)).  Blood Alcohol level:  Lab Results  Component Value Date   ETH <10 54/27/0623    Metabolic Disorder Labs: Lab Results  Component Value Date   HGBA1C 5.5 06/10/2022   MPG 111.15 06/10/2022   MPG 105 04/05/2021    No results found for: "PROLACTIN" Lab Results  Component Value Date   CHOL  246 (H) 06/10/2022   TRIG 94 06/10/2022   HDL 57 06/10/2022   CHOLHDL 4.3 06/10/2022   VLDL 19 06/10/2022   LDLCALC 170 (H) 06/10/2022   LDLCALC 67 04/05/2021    Physical Findings: AIMS:  , ,  ,  ,    CIWA:    COWS:     Musculoskeletal: Strength & Muscle Tone: within normal limits Gait & Station: normal Patient leans: N/A  Psychiatric Specialty Exam:  Presentation  General Appearance:  Appropriate for Environment; Casual  Eye Contact: Good  Speech: Clear and Coherent; Normal Rate  Speech Volume: Normal  Handedness: Right   Mood and Affect  Mood: Dysphoric  Affect: Congruent   Thought Process  Thought Processes: Coherent  Descriptions of Associations:Intact  Orientation:Full (Time, Place and Person)  Thought Content:Logical  History of Schizophrenia/Schizoaffective disorder:No  Duration of Psychotic Symptoms:No data recorded Hallucinations:No data recorded Ideas of Reference:None  Suicidal Thoughts:No data recorded Homicidal Thoughts:No data recorded  Sensorium  Memory: Immediate Good; Recent Good; Remote Good  Judgment: Good  Insight: Good   Executive Functions  Concentration: Good  Attention Span: Good  Recall: Good  Fund of Knowledge: Good  Language: Good   Psychomotor Activity  Psychomotor Activity:No data recorded  Assets  Assets: Physical Health; Resilience; Social Support; Catering manager; Housing   Sleep  Sleep:No data recorded   Physical Exam: Physical Exam Vitals and nursing note reviewed.  Constitutional:      Appearance: Normal appearance.  HENT:     Head: Normocephalic and atraumatic.     Mouth/Throat:     Pharynx: Oropharynx is clear.  Eyes:     Pupils: Pupils are equal, round, and reactive to light.  Cardiovascular:     Rate and Rhythm: Normal rate and regular rhythm.  Pulmonary:     Effort:  Pulmonary effort is normal.     Breath sounds: Normal breath sounds.  Abdominal:     General: Abdomen is flat.     Palpations: Abdomen is soft.  Musculoskeletal:        General: Normal range of motion.  Skin:    General: Skin is warm and dry.  Neurological:     General: No focal deficit present.     Mental Status: She is alert. Mental status is at baseline.  Psychiatric:        Attention and Perception: She is inattentive.        Mood and Affect: Mood is anxious.        Speech: Speech is delayed.        Behavior: Behavior is slowed.        Thought Content: Thought content normal.    Review of Systems  Constitutional: Negative.   HENT: Negative.    Eyes: Negative.   Respiratory: Negative.    Cardiovascular: Negative.   Gastrointestinal: Negative.   Musculoskeletal: Negative.   Skin: Negative.   Neurological: Negative.   Psychiatric/Behavioral:  Negative for depression, substance abuse and suicidal ideas. The patient is nervous/anxious.    Blood pressure (!) 117/57, pulse 71, temperature 97.7 F (36.5 C), temperature source Oral, resp. rate 18, height '5\' 2"'$  (1.575 m), SpO2 100 %. Body mass index is 20.01 kg/m.   Treatment Plan Summary: Medication management and Plan no change to medication orders.  Encourage group attendance.  Continue one-to-one for now because of falls yesterday.  Alethia Berthold, MD 06/19/2022, 3:00 PM

## 2022-06-20 DIAGNOSIS — F332 Major depressive disorder, recurrent severe without psychotic features: Secondary | ICD-10-CM | POA: Diagnosis not present

## 2022-06-20 LAB — URINALYSIS, ROUTINE W REFLEX MICROSCOPIC
Bilirubin Urine: NEGATIVE
Glucose, UA: NEGATIVE mg/dL
Hgb urine dipstick: NEGATIVE
Ketones, ur: NEGATIVE mg/dL
Nitrite: NEGATIVE
Protein, ur: NEGATIVE mg/dL
Specific Gravity, Urine: 1.021 (ref 1.005–1.030)
pH: 5 (ref 5.0–8.0)

## 2022-06-20 LAB — CBC WITH DIFFERENTIAL/PLATELET
Abs Immature Granulocytes: 0.05 10*3/uL (ref 0.00–0.07)
Basophils Absolute: 0.1 10*3/uL (ref 0.0–0.1)
Basophils Relative: 1 %
Eosinophils Absolute: 0.1 10*3/uL (ref 0.0–0.5)
Eosinophils Relative: 1 %
HCT: 40.7 % (ref 36.0–46.0)
Hemoglobin: 13.2 g/dL (ref 12.0–15.0)
Immature Granulocytes: 1 %
Lymphocytes Relative: 15 %
Lymphs Abs: 1.6 10*3/uL (ref 0.7–4.0)
MCH: 29.4 pg (ref 26.0–34.0)
MCHC: 32.4 g/dL (ref 30.0–36.0)
MCV: 90.6 fL (ref 80.0–100.0)
Monocytes Absolute: 1.1 10*3/uL — ABNORMAL HIGH (ref 0.1–1.0)
Monocytes Relative: 11 %
Neutro Abs: 7.7 10*3/uL (ref 1.7–7.7)
Neutrophils Relative %: 71 %
Platelets: 209 10*3/uL (ref 150–400)
RBC: 4.49 MIL/uL (ref 3.87–5.11)
RDW: 13.1 % (ref 11.5–15.5)
WBC: 10.6 10*3/uL — ABNORMAL HIGH (ref 4.0–10.5)
nRBC: 0 % (ref 0.0–0.2)

## 2022-06-20 LAB — COMPREHENSIVE METABOLIC PANEL
ALT: 18 U/L (ref 0–44)
AST: 27 U/L (ref 15–41)
Albumin: 3.6 g/dL (ref 3.5–5.0)
Alkaline Phosphatase: 40 U/L (ref 38–126)
Anion gap: 9 (ref 5–15)
BUN: 20 mg/dL (ref 8–23)
CO2: 28 mmol/L (ref 22–32)
Calcium: 8.9 mg/dL (ref 8.9–10.3)
Chloride: 102 mmol/L (ref 98–111)
Creatinine, Ser: 0.81 mg/dL (ref 0.44–1.00)
GFR, Estimated: >60 mL/min (ref >60)
Glucose, Bld: 79 mg/dL (ref 70–99)
Potassium: 4 mmol/L (ref 3.5–5.1)
Sodium: 139 mmol/L (ref 135–145)
Total Bilirubin: 0.7 mg/dL (ref 0.3–1.2)
Total Protein: 6 g/dL — ABNORMAL LOW (ref 6.5–8.1)

## 2022-06-20 MED ORDER — LORAZEPAM 2 MG/ML IJ SOLN: 1.0000 mg | INTRAMUSCULAR | Status: DC | PRN

## 2022-06-20 MED ORDER — LORAZEPAM 2 MG/ML IJ SOLN: 2.0000 mg | Freq: Once | INTRAMUSCULAR | Status: DC

## 2022-06-20 MED ORDER — RISPERIDONE 1 MG PO TABS
0.5000 mg | ORAL_TABLET | ORAL | Status: DC
  Administered 2022-06-21 – 2022-06-25 (×10): 0.5 mg via ORAL
  Filled 2022-06-20 (×10): qty 1

## 2022-06-20 MED ORDER — SULFAMETHOXAZOLE-TRIMETHOPRIM 800-160 MG PO TABS
1.0000 | ORAL_TABLET | Freq: Two times a day (BID) | ORAL | Status: AC
  Administered 2022-06-20 – 2022-06-26 (×12): 1 via ORAL
  Filled 2022-06-20 (×14): qty 1

## 2022-06-20 MED ORDER — LORAZEPAM 1 MG PO TABS
1.0000 mg | ORAL_TABLET | ORAL | Status: DC | PRN
  Administered 2022-06-21 – 2022-06-25 (×7): 1 mg via ORAL
  Filled 2022-06-20 (×7): qty 1

## 2022-06-20 MED ORDER — LORAZEPAM 1 MG PO TABS
2.0000 mg | ORAL_TABLET | Freq: Once | ORAL | Status: AC
  Administered 2022-06-20: 2 mg via ORAL
  Filled 2022-06-20: qty 2

## 2022-06-20 NOTE — Progress Notes (Signed)
Bactrim DS (antibiotic) was ordered due to the results of the urinalysis obtained this afternoon.

## 2022-06-20 NOTE — Progress Notes (Signed)
1:1 note   Patient resting in bed with eyes closed. No agitation or restlessness noted. Sitter at bedside. Patient is safe.

## 2022-06-20 NOTE — Progress Notes (Signed)
Patient did not attend group.

## 2022-06-20 NOTE — Progress Notes (Signed)
UA collected and sent to lab.

## 2022-06-20 NOTE — Group Note (Signed)
LCSW Group Therapy Note  Group Date: 06/20/2022 Start Time: 0233 End Time: 1630   Type of Therapy and Topic:  Group Therapy - Healthy vs Unhealthy Coping Skills  Participation Level:  Did Not Attend   Description of Group The focus of this group was to determine what unhealthy coping techniques typically are used by group members and what healthy coping techniques would be helpful in coping with various problems. Patients were guided in becoming aware of the differences between healthy and unhealthy coping techniques. Patients were asked to identify 2-3 healthy coping skills they would like to learn to use more effectively.  Therapeutic Goals Patients learned that coping is what human beings do all day long to deal with various situations in their lives Patients defined and discussed healthy vs unhealthy coping techniques Patients identified their preferred coping techniques and identified whether these were healthy or unhealthy Patients determined 2-3 healthy coping skills they would like to become more familiar with and use more often. Patients provided support and ideas to each other   Summary of Patient Progress:  X  Therapeutic Modalities Cognitive Behavioral Therapy Motivational Interviewing  Jashayla Glatfelter A [place], LCSWA 06/20/2022  4:43 PM

## 2022-06-20 NOTE — Progress Notes (Signed)
Patient agitated this morning wanting to go home and verbally aggressive toward staff. Patient observed talking to self, picking things on air and floor. Ativan 2 mg and atarax 10 mg given. Patient remains under 1:1 supervision for high risk of falls.

## 2022-06-20 NOTE — Progress Notes (Signed)
[facility] MD Progress Note  06/20/2022 11:19 AM Cathy [name]  MRN:  [id number] Subjective: Patient is seen on rounds.  She is much different than she was on Friday.  She is not oriented and seems to be somewhat confused.  Dr. Weber Cooks told me that he stopped her Wellbutrin because she was getting a little bit manic and also fell and hit her head and her CT scan was unremarkable.  She is not on very much medication so I will go ahead and get some lab work.  There are no focal deficits and we will go from there.  In the meantime she is visually hallucinating grabbing at things in the air so we will start a low-dose of Risperdal until we get the lab work.  Principal Problem: Major depressive disorder, recurrent episode, severe (HCC) Diagnosis: Principal Problem:   Major depressive disorder, recurrent episode, severe ([place])  Total Time spent with patient: 15 minutes  Past Psychiatric History: She has been on Lexapro and Trintellix prescribed by her PCP.  No past psychiatric hospitalizations and no past suicide attempts.    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   Vision changes     Past Surgical History:  Procedure Laterality Date   CHOLECYSTECTOMY     IR IMAGING GUIDED PORT INSERTION  09/21/2020   LITHOTRIPSY  1984   LYMPH NODE BIOPSY     MASS BIOPSY Left 09/03/2020   Procedure: LEFT NECK JUGULAR NODE;  Surgeon: Rozetta Nunnery, MD;  Location: [facility];  Service: ENT;  Laterality: Left;   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   Procedure: LEFT TOTAL KNEE ARTHROPLASTY;  Surgeon: Gaynelle Arabian, MD;  Location: [facility];  Service: Orthopedics;  Laterality: Left;   Family History:  Family History  Problem Relation Age of Onset   Hypertension Mother    Stroke Mother    Heart attack Mother        CABG, 5 Stents   Dementia Mother    Cancer Father        Kidney   Asthma Sister    Diabetes Brother        Borderline   Alcohol abuse Brother    Cancer Maternal Aunt        stomach ca   Cancer Maternal Aunt        ovarian ca   Breast cancer Cousin    Colon cancer Neg Hx    Family Psychiatric  History: Unremarkable Social History:  Social History   Substance and Sexual Activity  Alcohol Use No     Social History   Substance and Sexual Activity  Drug Use No    Social History   Socioeconomic History   Marital status: Widowed    Spouse name: Not on file   Number of children: Not on file   Years of education:  Not on file   Highest education level: Not on file  Occupational History   Not on file  Tobacco Use   Smoking status: Never   Smokeless tobacco: Never  Substance and Sexual Activity   Alcohol use: No   Drug use: No   Sexual activity: Not Currently    Birth control/protection: Post-menopausal  Other Topics Concern   Not on file  Social History Narrative   Lives alone.     Son is [name].     Social Determinants of Health   Financial Resource Strain: Not on file  Food Insecurity: No Food Insecurity (06/14/2022)   Hunger Vital Sign    Worried About Running Out of Food in the Last Year: Never true    Ran Out of Food in the Last Year: Never true  Transportation Needs: No Transportation Needs (06/14/2022)   PRAPARE - Hydrologist (Medical): No    Lack of Transportation (Non-Medical): No  Physical Activity: Not on file  Stress: Not on file  Social Connections: Not on  file   Additional Social History:                         Sleep: Fair  Appetite:  Fair  Current Medications: Current Facility-Administered Medications  Medication Dose Route Frequency Provider Last Rate Last Admin   acetaminophen (TYLENOL) tablet 650 mg  650 mg Oral Q6H PRN Parks Ranger, DO       alum & mag hydroxide-simeth (MAALOX/MYLANTA) 200-200-20 MG/5ML suspension 30 mL  30 mL Oral Q4H PRN Parks Ranger, DO       aspirin EC tablet 81 mg  81 mg Oral Daily Parks Ranger, DO   81 mg at 06/20/22 0911   atorvastatin (LIPITOR) tablet 20 mg  20 mg Oral Daily Parks Ranger, DO   20 mg at 06/20/22 0911   cholecalciferol (VITAMIN D3) 25 MCG (1000 UNIT) tablet 1,000 Units  1,000 Units Oral Daily Parks Ranger, DO   1,000 Units at 06/20/22 0911   cyanocobalamin (VITAMIN B12) tablet 1,000 mcg  1,000 mcg Oral Daily Parks Ranger, DO   1,000 mcg at 06/20/22 0911   LORazepam (ATIVAN) tablet 1 mg  1 mg Oral Q4H PRN Parks Ranger, DO       Or   LORazepam (ATIVAN) injection 1 mg  1 mg Intramuscular Q4H PRN Parks Ranger, DO       losartan (COZAAR) tablet 25 mg  25 mg Oral Daily Parks Ranger, DO   25 mg at 06/20/22 0912   magnesium hydroxide (MILK OF MAGNESIA) suspension 30 mL  30 mL Oral Daily PRN Parks Ranger, DO   30 mL at 06/15/22 0815   multivitamin with minerals tablet 1 tablet  1 tablet Oral Daily Parks Ranger, DO   1 tablet at 06/20/22 0911   OLANZapine (ZYPREXA) tablet 5 mg  5 mg Oral QHS Parks Ranger, DO   5 mg at 06/19/22 2127   pantoprazole (PROTONIX) EC tablet 40 mg  40 mg Oral Daily Parks Ranger, DO   40 mg at 06/20/22 0911   polyethylene glycol (MIRALAX / GLYCOLAX) packet 17 g  17 g Oral Daily Parks Ranger, DO   17 g at 06/20/22 0911   risperiDONE (RISPERDAL) tablet 0.5 mg  0.5 mg Oral BH-[id number] Parks Ranger, DO        traZODone ([place])  tablet 50 mg  50 mg Oral QHS PRN Parks Ranger, DO   50 mg at 06/16/22 2122   venlafaxine XR (EFFEXOR-XR) 24 hr capsule 150 mg  150 mg Oral Q breakfast Parks Ranger, DO   150 mg at 06/20/22 5643    Lab Results: No results found for this or any previous visit (from the past 48 hour(s)).  Blood Alcohol level:  Lab Results  Component Value Date   ETH <10 32/95/1884    Metabolic Disorder Labs: Lab Results  Component Value Date   HGBA1C 5.5 06/10/2022   MPG 111.15 06/10/2022   MPG 105 04/05/2021   No results found for: "PROLACTIN" Lab Results  Component Value Date   CHOL 246 (H) 06/10/2022   TRIG 94 06/10/2022   HDL 57 06/10/2022   CHOLHDL 4.3 06/10/2022   VLDL 19 06/10/2022   LDLCALC 170 (H) 06/10/2022   LDLCALC 67 04/05/2021    Physical Findings: AIMS:  , ,  ,  ,    CIWA:    COWS:     Musculoskeletal: Strength & Muscle Tone: within normal limits Gait & Station: unsteady Patient leans: N/A  Psychiatric Specialty Exam:  Presentation  General Appearance:  Appropriate for Environment; Casual  Eye Contact: Good  Speech: Clear and Coherent; Normal Rate  Speech Volume: Normal  Handedness: Right   Mood and Affect  Mood: Dysphoric  Affect: Congruent   Thought Process  Thought Processes: Coherent  Descriptions of Associations:Intact  Orientation:Full (Time, Place and Person)  Thought Content:Logical  History of Schizophrenia/Schizoaffective disorder:No  Duration of Psychotic Symptoms:No data recorded Hallucinations:No data recorded Ideas of Reference:None  Suicidal Thoughts:No data recorded Homicidal Thoughts:No data recorded  Sensorium  Memory: Immediate Good; Recent Good; Remote Good  Judgment: Good  Insight: Good   Executive Functions  Concentration: Good  Attention Span: Good  Recall: Good  Fund of Knowledge: Good  Language: Good   Psychomotor Activity  Psychomotor  Activity:No data recorded  Assets  Assets: Physical Health; Resilience; Social Support; Catering manager; Housing   Sleep  Sleep:No data recorded   Physical Exam: Physical Exam Vitals and nursing note reviewed.  Constitutional:      Appearance: Normal appearance. She is normal weight.  Neurological:     General: No focal deficit present.     Mental Status: She is alert. She is disoriented.  Psychiatric:        Attention and Perception: She perceives visual hallucinations.        Mood and Affect: Mood normal. Affect is inappropriate.        Speech: Speech is tangential.        Behavior: Behavior normal. Behavior is cooperative.        Thought Content: Thought content normal.        Cognition and Memory: Cognition is impaired. Memory is impaired.        Judgment: Judgment is inappropriate.    Review of Systems  Constitutional: Negative.   HENT: Negative.    Eyes: Negative.   Respiratory: Negative.    Cardiovascular: Negative.   Gastrointestinal: Negative.   Genitourinary: Negative.   Musculoskeletal: Negative.   Skin: Negative.   Neurological: Negative.   Endo/Heme/Allergies: Negative.   Psychiatric/Behavioral: Negative.     Blood pressure (!) 122/47, pulse 80, temperature 97.9 F (36.6 C), resp. rate 18, height '5\' 2"'$  (1.575 m), SpO2 100 %. Body mass index is 20.01 kg/m.   Treatment Plan Summary: Daily contact with patient to assess and evaluate symptoms  and progress in treatment, Medication management, and Plan CBC, CMP, UA, Risperdal 0.5 mg twice a day.  Parks Ranger, DO 06/20/2022, 11:19 AM

## 2022-06-20 NOTE — BH IP Treatment Plan (Signed)
Interdisciplinary Treatment and Diagnostic Plan Update  06/20/2022 Time of Session: 8:30AM Cathy [name] MRN: [id number]  Principal Diagnosis: Major depressive disorder, recurrent episode, severe ([place])  Secondary Diagnoses: Principal Problem:   Major depressive disorder, recurrent episode, severe ([place])   Current Medications:  Current Facility-Administered Medications  Medication Dose Route Frequency Provider Last Rate Last Admin   acetaminophen (TYLENOL) tablet 650 mg  650 mg Oral Q6H PRN Parks Ranger, DO       alum & mag hydroxide-simeth (MAALOX/MYLANTA) 200-200-20 MG/5ML suspension 30 mL  30 mL Oral Q4H PRN Parks Ranger, DO       aspirin EC tablet 81 mg  81 mg Oral Daily Parks Ranger, DO   81 mg at 06/19/22 0847   atorvastatin (LIPITOR) tablet 20 mg  20 mg Oral Daily Parks Ranger, DO   20 mg at 06/19/22 0847   cholecalciferol (VITAMIN D3) 25 MCG (1000 UNIT) tablet 1,000 Units  1,000 Units Oral Daily Parks Ranger, DO   1,000 Units at 06/19/22 6962   cyanocobalamin (VITAMIN B12) tablet 1,000 mcg  1,000 mcg Oral Daily Parks Ranger, DO   1,000 mcg at 06/19/22 9528   hydrOXYzine (ATARAX) tablet 10 mg  10 mg Oral Q8H PRN Parks Ranger, DO   10 mg at 06/20/22 0455   LORazepam (ATIVAN) injection 2 mg  2 mg Intramuscular Once Anette Riedel M, NP       losartan (COZAAR) tablet 25 mg  25 mg Oral Daily Parks Ranger, DO   25 mg at 06/19/22 0847   magnesium hydroxide (MILK OF MAGNESIA) suspension 30 mL  30 mL Oral Daily PRN Parks Ranger, DO   30 mL at 06/15/22 0815   multivitamin with minerals tablet 1 tablet  1 tablet Oral Daily Parks Ranger, DO   1 tablet at 06/19/22 0847   OLANZapine (ZYPREXA) tablet 5 mg  5 mg Oral QHS Parks Ranger, DO   5 mg at 06/19/22 2127   pantoprazole (PROTONIX) EC tablet 40 mg  40 mg Oral Daily Parks Ranger, DO   40 mg at 06/19/22 0847    polyethylene glycol (MIRALAX / GLYCOLAX) packet 17 g  17 g Oral Daily Parks Ranger, DO   17 g at 06/19/22 0851   traZODone (DESYREL) tablet 50 mg  50 mg Oral QHS PRN Parks Ranger, DO   50 mg at 06/16/22 2122   venlafaxine XR (EFFEXOR-XR) 24 hr capsule 150 mg  150 mg Oral Q breakfast Parks Ranger, DO   150 mg at 06/19/22 4132   PTA Medications: Medications Prior to Admission  Medication Sig Dispense Refill Last Dose   albuterol (VENTOLIN HFA) 108 (90 Base) MCG/ACT inhaler Inhale 2 puffs into the lungs every 6 (six) hours as needed for wheezing or shortness of breath. 8 g 2    aspirin EC 81 MG tablet Take 81 mg by mouth daily.      atorvastatin (LIPITOR) 20 MG tablet Take 1 tablet (20 mg total) by mouth daily. 90 tablet 1    cetirizine (ZYRTEC) 10 MG tablet Take 1 tablet (10 mg total) by mouth daily. 90 tablet 1    cholecalciferol (CHOLECALCIFEROL) 25 MCG tablet Take 1 tablet (1,000 Units total) by mouth daily.      cyanocobalamin (VITAMIN B12) 1000 MCG tablet Take 1 tablet (1,000 mcg total) by mouth daily.      denosumab (PROLIA) 60 MG/ML SOSY injection Inject 60 mg  into the skin every 6 (six) months.      estradiol (ESTRACE) 0.1 MG/GM vaginal cream Discard plastic applicator. Insert blueberry size amount of cream on finger in vagina daily x1 week then 2x per week.      fluticasone furoate-vilanterol (BREO ELLIPTA) 100-25 MCG/ACT AEPB Inhale 1 puff into the lungs daily. (Patient taking differently: Inhale 1 puff into the lungs daily as needed.) 60 each 5    glucose blood (ACCU-CHEK AVIVA PLUS) test strip Use to Monitor blood sugars daily. DX. E11.9 100 each 5    hydrochlorothiazide (MICROZIDE) 12.5 MG capsule Take 1 capsule (12.5 mg total) by mouth daily. 90 capsule 1    hydroxypropyl methylcellulose / hypromellose (ISOPTO TEARS / GONIOVISC) 2.5 % ophthalmic solution Place 2 drops into both eyes 3 (three) times daily as needed for dry eyes.      lidocaine-prilocaine  (EMLA) cream Apply to affected area once (Patient taking differently: 1 Application as needed. Apply to affected area to port as needed) 30 g 3    losartan (COZAAR) 100 MG tablet TAKE 1 TABLET BY MOUTH ONCE A DAY 90 tablet 1    metoprolol succinate (TOPROL-XL) 50 MG 24 hr tablet Take 1 tablet (50 mg total) by mouth daily. Take with or immediately following a meal. 90 tablet 1    Multiple Vitamin (MULTIVITAMIN) capsule Take 1 capsule by mouth daily.      omeprazole (PRILOSEC) 20 MG capsule Take 1 capsule (20 mg total) by mouth daily. 90 capsule 1    venlafaxine XR (EFFEXOR XR) 75 MG 24 hr capsule Take 2 capsules (150 mg total) by mouth daily with breakfast. (Patient taking differently: Take 75 mg by mouth daily with breakfast.)       Patient Stressors:    Patient Strengths:    Treatment Modalities: Medication Management, Group therapy, Case management,  1 to 1 session with clinician, Psychoeducation, Recreational therapy.   Physician Treatment Plan for Primary Diagnosis: Major depressive disorder, recurrent episode, severe ([place]) Long Term Goal(s): Improvement in symptoms so as ready for discharge   Short Term Goals: Ability to identify changes in lifestyle to reduce recurrence of condition will improve Ability to verbalize feelings will improve Ability to disclose and discuss suicidal ideas Ability to demonstrate self-control will improve Ability to identify and develop effective coping behaviors will improve Ability to maintain clinical measurements within normal limits will improve Compliance with prescribed medications will improve Ability to identify triggers associated with substance abuse/mental health issues will improve  Medication Management: Evaluate patient's response, side effects, and tolerance of medication regimen.  Therapeutic Interventions: 1 to 1 sessions, Unit Group sessions and Medication administration.  Evaluation of Outcomes: Not Progressing  Physician  Treatment Plan for Secondary Diagnosis: Principal Problem:   Major depressive disorder, recurrent episode, severe ([place])  Long Term Goal(s): Improvement in symptoms so as ready for discharge   Short Term Goals: Ability to identify changes in lifestyle to reduce recurrence of condition will improve Ability to verbalize feelings will improve Ability to disclose and discuss suicidal ideas Ability to demonstrate self-control will improve Ability to identify and develop effective coping behaviors will improve Ability to maintain clinical measurements within normal limits will improve Compliance with prescribed medications will improve Ability to identify triggers associated with substance abuse/mental health issues will improve     Medication Management: Evaluate patient's response, side effects, and tolerance of medication regimen.  Therapeutic Interventions: 1 to 1 sessions, Unit Group sessions and Medication administration.  Evaluation of Outcomes: Not  Progressing   RN Treatment Plan for Primary Diagnosis: Major depressive disorder, recurrent episode, severe ([place]) Long Term Goal(s): Knowledge of disease and therapeutic regimen to maintain health will improve  Short Term Goals: Ability to remain free from injury will improve, Ability to verbalize frustration and anger appropriately will improve, Ability to demonstrate self-control, Ability to participate in decision making will improve, Ability to verbalize feelings will improve, Ability to identify and develop effective coping behaviors will improve, and Compliance with prescribed medications will improve  Medication Management: RN will administer medications as ordered by provider, will assess and evaluate patient's response and provide education to patient for prescribed medication. RN will report any adverse and/or side effects to prescribing provider.  Therapeutic Interventions: 1 on 1 counseling sessions, Psychoeducation, Medication  administration, Evaluate responses to treatment, Monitor vital signs and CBGs as ordered, Perform/monitor CIWA, COWS, AIMS and Fall Risk screenings as ordered, Perform wound care treatments as ordered.  Evaluation of Outcomes: Not Progressing   LCSW Treatment Plan for Primary Diagnosis: Major depressive disorder, recurrent episode, severe ([place]) Long Term Goal(s): Safe transition to appropriate next level of care at discharge, Engage patient in therapeutic group addressing interpersonal concerns.  Short Term Goals: Engage patient in aftercare planning with referrals and resources, Increase social support, Increase ability to appropriately verbalize feelings, Increase emotional regulation, Facilitate acceptance of mental health diagnosis and concerns, and Increase skills for wellness and recovery  Therapeutic Interventions: Assess for all discharge needs, 1 to 1 time with Social worker, Explore available resources and support systems, Assess for adequacy in community support network, Educate family and significant other(s) on suicide prevention, Complete Psychosocial Assessment, Interpersonal group therapy.  Evaluation of Outcomes: Not Progressing   Progress in Treatment: Attending groups: No. Participating in groups: No. Taking medication as prescribed: Yes. Toleration medication: Yes. Family/Significant other contact made: Yes, individual(s) contacted:  SPE completed with pt's son Cathy [name] Patient understands diagnosis: Yes. Discussing patient identified problems/goals with staff: Yes. Medical problems stabilized or resolved: Yes. Denies suicidal/homicidal ideation: Yes. Issues/concerns per patient self-inventory: No. Other: None  New Short Term/Long Term Goal(s): Patient to work towards  medication management for mood stabilization; elimination of SI thoughts; development of comprehensive mental wellness plan. Update 06/20/22: No changes at this time.    Patient Goals:  "just tryna  sort myself out" Update 06/20/22: No changes at this time.    Discharge Plan or Barriers: CSW will assist pt with development of appropriate discharge/aftercare plan. Update 06/20/22: No changes at this time.    Reason for Continuation of Hospitalization: Anxiety Depression Medication stabilization   Estimated Length of Stay: 1-7 days  Last 3 [place] Suicide Severity Risk Score: Flowsheet Row Admission (Current) from 06/14/2022 in [facility] from [facility]/10/2022 in [facility] Admission (Discharged) from 09/03/2020 in [facility] No Risk No Risk No Risk       Last PHQ 2/9 Scores:    08/10/2020    9:35 AM 01/13/2020    3:59 PM 07/16/2019    9:36 AM  Depression screen PHQ 2/9  Decreased Interest 0 0 0  Down, Depressed, Hopeless 0 0 0  PHQ - 2 Score 0 0 0    Scribe for Treatment Team: Tahjir Silveria A [place], [place] 06/20/2022 9:05 AM

## 2022-06-20 NOTE — Progress Notes (Signed)
1:1 note - patient is sitting in the dayroom with sitter at chair side. Pt is alert and oriented to name but does not appear to know where she is, the date or time. 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 Patient took her medications this morning without any problems. She is confused and her gait is unsteady. Dr. Louis Meckel aware.

## 2022-06-20 NOTE — Plan of Care (Deleted)
  Problem: Education: Goal: Knowledge of General Education information will improve Description: Including pain rating scale, medication(s)/side effects and non-pharmacologic comfort measures 06/20/2022 1028 by Luanna Salk, RN Outcome: Not Progressing 06/20/2022 1026 by Luanna Salk, RN Outcome: Progressing   Problem: Health Behavior/Discharge Planning: Goal: Ability to manage health-related needs will improve 06/20/2022 1028 by Luanna Salk, RN Outcome: Not Progressing 06/20/2022 1026 by Luanna Salk, RN Outcome: Progressing   Problem: Clinical Measurements: Goal: Ability to maintain clinical measurements within normal limits will improve 06/20/2022 1028 by Luanna Salk, RN Outcome: Not Progressing 06/20/2022 1026 by Luanna Salk, RN Outcome: Progressing Goal: Will remain free from infection 06/20/2022 1028 by Luanna Salk, RN Outcome: Not Progressing 06/20/2022 1026 by Luanna Salk, RN Outcome: Progressing Goal: Diagnostic test results will improve 06/20/2022 1028 by Luanna Salk, RN Outcome: Not Progressing 06/20/2022 1026 by Luanna Salk, RN Outcome: Progressing Goal: Respiratory complications will improve 06/20/2022 1028 by Luanna Salk, RN Outcome: Not Progressing 06/20/2022 1026 by Luanna Salk, RN Outcome: Progressing Goal: Cardiovascular complication will be avoided 06/20/2022 1028 by Luanna Salk, RN Outcome: Not Progressing 06/20/2022 1026 by Luanna Salk, RN Outcome: Progressing   Problem: Activity: Goal: Risk for activity intolerance will decrease 06/20/2022 1028 by Luanna Salk, RN Outcome: Not Progressing 06/20/2022 1026 by Luanna Salk, RN Outcome: Progressing   Problem: Nutrition: Goal: Adequate nutrition will be maintained 06/20/2022 1028 by Luanna Salk, RN Outcome: Not Progressing 06/20/2022 1026 by Luanna Salk, RN Outcome: Progressing

## 2022-06-21 DIAGNOSIS — F332 Major depressive disorder, recurrent severe without psychotic features: Secondary | ICD-10-CM | POA: Diagnosis not present

## 2022-06-21 NOTE — BHH Counselor (Signed)
CSW received call from this pt's son.  Son requested update from [employer], nursing or physician.  CSW reported that at this time she is not the patient's CSW, however, will follow up with the primary CSW.  CSW updated primary CSW who reports they will follow up.  Assunta Curtis, MSW, LCSW 06/21/2022 3:59 PM

## 2022-06-21 NOTE — Progress Notes (Signed)
   06/21/22 2200  Psych Admission Type (Psych Patients Only)  Admission Status Involuntary  Psychosocial Assessment  Patient Complaints Anxiety  Eye Contact Fair  Facial Expression Anxious  Affect Anxious  Speech Logical/coherent  Interaction Minimal  Motor Activity Slow  Appearance/Hygiene Unremarkable  Behavior Characteristics Anxious  Mood Anxious  Thought Process  Coherency WDL  Content UTA  Delusions UTA  Perception UTA  Hallucination UTA  Judgment Limited  Confusion Severe  Danger to Self  Current suicidal ideation? Denies  Self-Injurious Behavior No self-injurious ideation or behavior indicators observed or expressed   Agreement Not to Harm Self Yes  Description of Agreement verbal  Danger to Others  Danger to Others None reported or observed

## 2022-06-21 NOTE — BHH Counselor (Signed)
CSW contacted pt's son, Aslynn Brunetti, [phone], regarding pt's plan of care and updates.   CSW informed son that pt was diagnosed with UTI and started on a course of antibiotics and seemed more goal oriented today.   CSW informed him that date of discharge had to been determined but follow up was scheduled.   He also spoke with nursing regarding updates on pt's medical health.   CSW will follow up with pt's son regarding discharge once date is determined.   No other requests were made. Conversation ended without incident.   Quince Santana [place], MSW, LCSW-A 1/9/20242:19 PM

## 2022-06-21 NOTE — BHH Group Notes (Signed)
[place] Group Notes:  (Nursing/MHT/Case Management/Adjunct)  Date:  06/21/2022  Time:  6:08 PM  Type of Therapy:  Music Therapy  Participation Level:  Active  Participation Quality:  Appropriate  Affect:  Anxious  Cognitive:  Confused  Insight:  Lacking  Engagement in Group:  Improving  Modes of Intervention:  Activity  Summary of Progress/Problems:  Iniya Matzek l Brittish Bolinger 06/21/2022, 6:08 PM

## 2022-06-21 NOTE — Group Note (Signed)
[facility] LCSW Group Therapy Note   Group Date: 06/21/2022 Start Time: 1330 End Time: 1355   Type of Therapy/Topic:  Group Therapy:  Emotion Regulation  Participation Level:  Minimal   Mood:  Description of Group:    The purpose of this group is to assist patients in learning to regulate negative emotions and experience positive emotions. Patients will be guided to discuss ways in which they have been vulnerable to their negative emotions. These vulnerabilities will be juxtaposed with experiences of positive emotions or situations, and patients challenged to use positive emotions to combat negative ones. Special emphasis will be placed on coping with negative emotions in conflict situations, and patients will process healthy conflict resolution skills.  Therapeutic Goals: Patient will identify two positive emotions or experiences to reflect on in order to balance out negative emotions:  Patient will label two or more emotions that they find the most difficult to experience:  Patient will be able to demonstrate positive conflict resolution skills through discussion or role plays:   Summary of Patient Progress:   Patient was present for the entirety of the group session. Patient was an active listener and but was unable to participate in the topic of discussion. She was able to state that her favorite rainy day activity was  "jumping in puddles." Group ended early due to unit disruption from [place] and patient needing to be moved to a safer area.      Therapeutic Modalities:   Cognitive Behavioral Therapy Feelings Identification Dialectical Behavioral Therapy   Briana Farner A [place], LCSWA

## 2022-06-21 NOTE — Progress Notes (Signed)
   06/21/22 0758  Psych Admission Type (Psych Patients Only)  Admission Status Involuntary  Psychosocial Assessment  Patient Complaints Anxiety  Eye Contact Fair  Facial Expression Anxious  Affect Anxious  Speech Logical/coherent  Interaction Minimal  Motor Activity Slow  Appearance/Hygiene Unremarkable  Behavior Characteristics Anxious  Mood Anxious  Thought Process  Coherency Unable to assess  Content UTA  Delusions UTA  Perception UTA  Hallucination UTA  Judgment Limited  Confusion Severe  Danger to Self  Current suicidal ideation? Denies  Danger to Others  Danger to Others None reported or observed

## 2022-06-21 NOTE — Progress Notes (Signed)
Patient started taking her Bactrim DS tonight , no allergic reaction noted. Patient given a bottle of Gatorade which she drank and finished for dehydration. Patient also was given tylenol for low grade fever and was effective. Patient remains 1:1 for safety and will continue to monitor.

## 2022-06-21 NOTE — Progress Notes (Signed)
Patient is alert and oriented times 1. Mood and affect sullen and anxious. Patient denies pain. UTA due to cognition. Morning meds given whole by mouth with applesauce. Appetite is poor- only eats 25% of meals. Patient continues ABT for UTI.  Patient remains on unit with Q15 minute checks in place.

## 2022-06-21 NOTE — Progress Notes (Signed)
[facility] MD Progress Note  06/21/2022 12:15 PM Cathy PEREZGARCIA  MRN:  [id number] Subjective: Cathy [name] is seen on rounds.  She has a UTI and has received some antibiotics and her thought process seems to be a little bit more goal directed today because she was having visual hallucinations yesterday and was pretty disoriented today she answers my questions appropriately and even asked when she can go home.  Hopefully, the antibiotics are already working.  No problems with her medications.  She denies any side effects.  Principal Problem: Major depressive disorder, recurrent episode, severe (HCC) Diagnosis: Principal Problem:   Major depressive disorder, recurrent episode, severe ([employer])  Total Time spent with patient: 15 minutes  Past Psychiatric History: She has been on Lexapro and Trintellix prescribed by her PCP.  No past psychiatric hospitalizations and no past suicide attempts.     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   Vision changes     Past Surgical History:  Procedure Laterality Date   CHOLECYSTECTOMY     IR IMAGING GUIDED PORT INSERTION  09/21/2020   LITHOTRIPSY  1984   LYMPH NODE BIOPSY     MASS BIOPSY Left 09/03/2020   Procedure: LEFT NECK JUGULAR NODE;  Surgeon: Rozetta Nunnery, MD;  Location: [facility];  Service: ENT;  Laterality: Left;   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   Procedure: LEFT TOTAL KNEE ARTHROPLASTY;  Surgeon:  Gaynelle Arabian, MD;  Location: [facility];  Service: Orthopedics;  Laterality: Left;   Family History:  Family History  Problem Relation Age of Onset   Hypertension Mother    Stroke Mother    Heart attack Mother        CABG, 5 Stents   Dementia Mother    Cancer Father        Kidney   Asthma Sister    Diabetes Brother        Borderline   Alcohol abuse Brother    Cancer Maternal Aunt        stomach ca   Cancer Maternal Aunt        ovarian ca   Breast cancer Cousin    Colon cancer Neg Hx     Social History:  Social History   Substance and Sexual Activity  Alcohol Use No     Social History   Substance and Sexual Activity  Drug Use No    Social History   Socioeconomic History   Marital status: Widowed    Spouse name: Not on file   Number of children: Not on file   Years of education: Not on file   Highest education level: Not on file  Occupational History   Not on file  Tobacco Use   Smoking status: Never   Smokeless tobacco: Never  Substance and Sexual Activity   Alcohol use: No   Drug  use: No   Sexual activity: Not Currently    Birth control/protection: Post-menopausal  Other Topics Concern   Not on file  Social History Narrative   Lives alone.     Son is [name].     Social Determinants of Health   Financial Resource Strain: Not on file  Food Insecurity: No Food Insecurity (06/14/2022)   Hunger Vital Sign    Worried About Running Out of Food in the Last Year: Never true    Ran Out of Food in the Last Year: Never true  Transportation Needs: No Transportation Needs (06/14/2022)   PRAPARE - Hydrologist (Medical): No    Lack of Transportation (Non-Medical): No  Physical Activity: Not on file  Stress: Not on file  Social Connections: Not on file   Additional Social History:                         Sleep: Good  Appetite:  Fair  Current Medications: Current Facility-Administered Medications  Medication  Dose Route Frequency Provider Last Rate Last Admin   acetaminophen (TYLENOL) tablet 650 mg  650 mg Oral Q6H PRN Parks Ranger, DO   650 mg at 06/20/22 2121   alum & mag hydroxide-simeth (MAALOX/MYLANTA) 200-200-20 MG/5ML suspension 30 mL  30 mL Oral Q4H PRN Parks Ranger, DO       aspirin EC tablet 81 mg  81 mg Oral Daily Parks Ranger, DO   81 mg at 06/21/22 1141   atorvastatin (LIPITOR) tablet 20 mg  20 mg Oral Daily Parks Ranger, DO   20 mg at 06/21/22 1141   cholecalciferol (VITAMIN D3) 25 MCG (1000 UNIT) tablet 1,000 Units  1,000 Units Oral Daily Parks Ranger, DO   1,000 Units at 06/21/22 1142   cyanocobalamin (VITAMIN B12) tablet 1,000 mcg  1,000 mcg Oral Daily Parks Ranger, DO   1,000 mcg at 06/21/22 1142   LORazepam (ATIVAN) tablet 1 mg  1 mg Oral Q4H PRN Parks Ranger, DO   1 mg at 06/21/22 1141   Or   LORazepam (ATIVAN) injection 1 mg  1 mg Intramuscular Q4H PRN Parks Ranger, DO       losartan (COZAAR) tablet 25 mg  25 mg Oral Daily Parks Ranger, DO   25 mg at 06/21/22 1141   magnesium hydroxide (MILK OF MAGNESIA) suspension 30 mL  30 mL Oral Daily PRN Parks Ranger, DO   30 mL at 06/15/22 0815   multivitamin with minerals tablet 1 tablet  1 tablet Oral Daily Parks Ranger, DO   1 tablet at 06/21/22 1141   OLANZapine (ZYPREXA) tablet 5 mg  5 mg Oral QHS Parks Ranger, DO   5 mg at 06/20/22 2121   pantoprazole (PROTONIX) EC tablet 40 mg  40 mg Oral Daily Parks Ranger, DO   40 mg at 06/21/22 1142   polyethylene glycol (MIRALAX / GLYCOLAX) packet 17 g  17 g Oral Daily Parks Ranger, DO   17 g at 06/21/22 1141   risperiDONE (RISPERDAL) tablet 0.5 mg  0.5 mg Oral [id number] Parks Ranger, DO   0.5 mg at 06/21/22 1142   sulfamethoxazole-trimethoprim (BACTRIM DS) 800-160 MG per tablet 1 tablet  1 tablet Oral Q12H Parks Ranger, DO   1  tablet at 06/21/22 1143   traZODone (DESYREL) tablet 50 mg  50 mg Oral QHS PRN  Parks Ranger, DO   50 mg at 06/16/22 2122   venlafaxine XR (EFFEXOR-XR) 24 hr capsule 150 mg  150 mg Oral Q breakfast Parks Ranger, DO   150 mg at 06/21/22 1142    Lab Results:  Results for orders placed or performed during the hospital encounter of 06/14/22 (from the past 48 hour(s))  Urinalysis, Routine w reflex microscopic Urine, Clean Catch     Status: Abnormal   Collection Time: 06/20/22 12:18 PM  Result Value Ref Range   Color, Urine YELLOW (A) YELLOW   APPearance HAZY (A) CLEAR   Specific Gravity, Urine 1.021 1.005 - 1.030   pH 5.0 5.0 - 8.0   Glucose, UA NEGATIVE NEGATIVE mg/dL   Hgb urine dipstick NEGATIVE NEGATIVE   Bilirubin Urine NEGATIVE NEGATIVE   Ketones, ur NEGATIVE NEGATIVE mg/dL   Protein, ur NEGATIVE NEGATIVE mg/dL   Nitrite NEGATIVE NEGATIVE   Leukocytes,Ua LARGE (A) NEGATIVE   RBC / HPF 0-5 0 - 5 RBC/hpf   WBC, UA 11-20 0 - 5 WBC/hpf   Bacteria, UA FEW (A) NONE SEEN   Squamous Epithelial / HPF 6-10 0 - 5 /HPF   Mucus PRESENT     Comment: Performed at [facility], [address] [place], [place] [address]  CBC with Differential/Platelet     Status: Abnormal   Collection Time: 06/20/22  1:50 PM  Result Value Ref Range   WBC 10.6 (H) 4.0 - 10.5 K/uL   RBC 4.49 3.87 - 5.11 MIL/uL   Hemoglobin 13.2 12.0 - 15.0 g/dL   HCT 40.7 36.0 - 46.0 %   MCV 90.6 80.0 - 100.0 fL   MCH 29.4 26.0 - 34.0 pg   MCHC 32.4 30.0 - 36.0 g/dL   RDW 13.1 11.5 - 15.5 %   Platelets 209 150 - 400 K/uL   nRBC 0.0 0.0 - 0.2 %   Neutrophils Relative % 71 %   Neutro Abs 7.7 1.7 - 7.7 K/uL   Lymphocytes Relative 15 %   Lymphs Abs 1.6 0.7 - 4.0 K/uL   Monocytes Relative 11 %   Monocytes Absolute 1.1 (H) 0.1 - 1.0 K/uL   Eosinophils Relative 1 %   Eosinophils Absolute 0.1 0.0 - 0.5 K/uL   Basophils Relative 1 %   Basophils Absolute 0.1 0.0 - 0.1 K/uL   Immature Granulocytes  1 %   Abs Immature Granulocytes 0.05 0.00 - 0.07 K/uL    Comment: Performed at [facility], [address] [place], [place] [address]  Comprehensive metabolic panel     Status: Abnormal   Collection Time: 06/20/22  1:50 PM  Result Value Ref Range   Sodium 139 135 - 145 mmol/L   Potassium 4.0 3.5 - 5.1 mmol/L   Chloride 102 98 - 111 mmol/L   CO2 28 22 - 32 mmol/L   Glucose, Bld 79 70 - 99 mg/dL    Comment: Glucose reference range applies only to samples taken after fasting for at least 8 hours.   BUN 20 8 - 23 mg/dL   Creatinine, Ser 0.81 0.44 - 1.00 mg/dL   Calcium 8.9 8.9 - 10.3 mg/dL   Total Protein 6.0 (L) 6.5 - 8.1 g/dL   Albumin 3.6 3.5 - 5.0 g/dL   AST 27 15 - 41 U/L   ALT 18 0 - 44 U/L   Alkaline Phosphatase 40 38 - 126 U/L   Total Bilirubin 0.7 0.3 - 1.2 mg/dL   GFR, Estimated >60 >  60 mL/min    Comment: (NOTE) Calculated using the CKD-EPI Creatinine Equation (2021)    Anion gap 9 5 - 15    Comment: Performed at [facility], [address] [place], [place] [address]    Blood Alcohol level:  Lab Results  Component Value Date   [facility] <10 25/85/2778    Metabolic Disorder Labs: Lab Results  Component Value Date   HGBA1C 5.5 06/10/2022   MPG 111.15 06/10/2022   MPG 105 04/05/2021   No results found for: "PROLACTIN" Lab Results  Component Value Date   CHOL 246 (H) 06/10/2022   TRIG 94 06/10/2022   HDL 57 06/10/2022   CHOLHDL 4.3 06/10/2022   VLDL 19 06/10/2022   LDLCALC 170 (H) 06/10/2022   LDLCALC 67 04/05/2021    Physical Findings: AIMS:  , ,  ,  ,    CIWA:    COWS:     Musculoskeletal: Strength & Muscle Tone: within normal limits Gait & Station: normal Patient leans: N/A  Psychiatric Specialty Exam:  Presentation  General Appearance:  Appropriate for Environment; Casual  Eye Contact: Good  Speech: Clear and Coherent; Normal Rate  Speech Volume: Normal  Handedness: Right   Mood and Affect   Mood: Dysphoric  Affect: Congruent   Thought Process  Thought Processes: Coherent  Descriptions of Associations:Intact  Orientation:Full (Time, Place and Person)  Thought Content:Logical  History of Schizophrenia/Schizoaffective disorder:No  Duration of Psychotic Symptoms:No data recorded Hallucinations:No data recorded Ideas of Reference:None  Suicidal Thoughts:No data recorded Homicidal Thoughts:No data recorded  Sensorium  Memory: Immediate Good; Recent Good; Remote Good  Judgment: Good  Insight: Good   Executive Functions  Concentration: Good  Attention Span: Good  Recall: Good  Fund of Knowledge: Good  Language: Good   Psychomotor Activity  Psychomotor Activity:No data recorded  Assets  Assets: Physical Health; Resilience; Social Support; Catering manager; Housing   Sleep  Sleep:No data recorded   Physical Exam: Physical Exam Vitals and nursing note reviewed.  Constitutional:      Appearance: Normal appearance. She is normal weight.  Neurological:     General: No focal deficit present.     Mental Status: She is alert and oriented to person, place, and time.  Psychiatric:        Attention and Perception: Attention and perception normal.        Mood and Affect: Mood is depressed. Affect is flat.        Speech: Speech is tangential.        Behavior: Behavior is slowed and withdrawn.        Thought Content: Thought content normal.        Cognition and Memory: Cognition is impaired.        Judgment: Judgment is inappropriate.    Review of Systems  Constitutional: Negative.   HENT: Negative.    Eyes: Negative.   Respiratory: Negative.    Cardiovascular: Negative.   Gastrointestinal: Negative.   Genitourinary: Negative.   Musculoskeletal: Negative.   Skin: Negative.   Neurological: Negative.   Endo/Heme/Allergies: Negative.   Psychiatric/Behavioral: Negative.     Blood pressure 133/73, pulse (!) 101,  temperature 98.6 F (37 C), temperature source Oral, resp. rate 16, height '5\' 2"'$  (1.575 m), SpO2 97 %. Body mass index is 20.01 kg/m.   Treatment Plan Summary: Daily contact with patient to assess and evaluate symptoms and progress in treatment, Medication management, and Plan continue current medications.  Parks Ranger, DO 06/21/2022, 12:15 PM

## 2022-06-22 DIAGNOSIS — F332 Major depressive disorder, recurrent severe without psychotic features: Secondary | ICD-10-CM | POA: Diagnosis not present

## 2022-06-22 MED ORDER — MODAFINIL 100 MG PO TABS
100.0000 mg | ORAL_TABLET | Freq: Every day | ORAL | Status: DC
  Administered 2022-06-22 – 2022-06-28 (×7): 100 mg via ORAL
  Filled 2022-06-22 (×7): qty 1

## 2022-06-22 MED ORDER — ENSURE ENLIVE PO LIQD
237.0000 mL | Freq: Three times a day (TID) | ORAL | Status: DC
  Administered 2022-06-22 – 2022-07-27 (×56): 237 mL via ORAL

## 2022-06-22 NOTE — Progress Notes (Signed)
[facility] MD Progress Note  06/22/2022 11:07 AM [place]  MRN:  [id number] Subjective: Cathy [name] is seen on rounds.  She continues to be a one-to-one for her safety.  She asks me about her son, [name].  Apparently, Tom came and visited her last night to the nurses tell me that he is a family practice doctor.  She is more alert and more oriented but still a little bit confused.  I am going to start some Provigil this morning and then get her to see PT tomorrow if she still doing better.  So far, no side effects from her medications.  She does complain of depression and anxiety but did sleep better last night. Principal Problem: Major depressive disorder, recurrent episode, severe (HCC) Diagnosis: Principal Problem:   Major depressive disorder, recurrent episode, severe ([place])  Total Time spent with patient: 15 minutes  Past Psychiatric History:  She has been on Lexapro and Trintellix prescribed by her PCP.  No past psychiatric hospitalizations and no past suicide attempts.   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   Vision changes     Past Surgical History:  Procedure Laterality Date   CHOLECYSTECTOMY     IR IMAGING GUIDED PORT INSERTION  09/21/2020   LITHOTRIPSY  1984   LYMPH NODE BIOPSY     MASS BIOPSY Left 09/03/2020   Procedure: LEFT NECK JUGULAR NODE;  Surgeon: Rozetta Nunnery, MD;  Location: [place];  Service: ENT;  Laterality: Left;    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   Procedure: LEFT TOTAL KNEE ARTHROPLASTY;  Surgeon: Gaynelle Arabian, MD;  Location: [facility];  Service: Orthopedics;  Laterality: Left;   Family History:  Family History  Problem Relation Age of Onset   Hypertension Mother    Stroke Mother    Heart attack Mother        CABG, 5 Stents   Dementia Mother    Cancer Father        Kidney   Asthma Sister    Diabetes Brother        Borderline   Alcohol abuse Brother    Cancer Maternal Aunt        stomach ca   Cancer Maternal Aunt        ovarian ca   Breast cancer Cousin    Colon cancer Neg Hx    Family Psychiatric  History: Unremarkable Social History:  Social History   Substance and Sexual Activity  Alcohol Use No     Social History   Substance and Sexual Activity  Drug Use No    Social History   Socioeconomic History   Marital status: Widowed    Spouse name: Not on file   Number of children: Not on file   Years of education: Not on file   Highest education level: Not on file  Occupational History   Not on file  Tobacco Use   Smoking status: Never   Smokeless tobacco: Never  Substance and Sexual Activity   Alcohol use: No   Drug use: No   Sexual activity: Not Currently    Birth control/protection: Post-menopausal  Other Topics Concern   Not on file  Social History Narrative   Lives alone.     Son is [name].     Social Determinants of Health   Financial Resource Strain: Not on file  Food Insecurity: No Food Insecurity (06/14/2022)   Hunger Vital Sign    Worried About Running Out of Food in the Last Year: Never true    Ran Out of Food in the Last Year: Never true  Transportation Needs: No Transportation Needs (06/14/2022)   PRAPARE - Hydrologist (Medical): No    Lack of Transportation (Non-Medical): No  Physical Activity: Not on file  Stress: Not on file  Social Connections: Not on file   Additional Social History:                          Sleep: Good  Appetite:  Fair  Current Medications: Current Facility-Administered Medications  Medication Dose Route Frequency Provider Last Rate Last Admin   acetaminophen (TYLENOL) tablet 650 mg  650 mg Oral Q6H PRN Parks Ranger, DO   650 mg at 06/20/22 2121   alum & mag hydroxide-simeth (MAALOX/MYLANTA) 200-200-20 MG/5ML suspension 30 mL  30 mL Oral Q4H PRN Parks Ranger, DO       aspirin EC tablet 81 mg  81 mg Oral Daily Parks Ranger, DO   81 mg at 06/22/22 0857   atorvastatin (LIPITOR) tablet 20 mg  20 mg Oral Daily Parks Ranger, DO   20 mg at 06/22/22 0857   cholecalciferol (VITAMIN D3) 25 MCG (1000 UNIT) tablet 1,000 Units  1,000 Units Oral Daily Parks Ranger, DO   1,000 Units at 06/22/22 7026   cyanocobalamin (VITAMIN B12) tablet 1,000 mcg  1,000 mcg Oral Daily Parks Ranger, DO   1,000 mcg at 06/22/22 3785   LORazepam (ATIVAN) tablet 1 mg  1 mg Oral Q4H PRN Parks Ranger, DO   1 mg at 06/21/22 1141   Or   LORazepam (ATIVAN) injection 1 mg  1 mg Intramuscular Q4H PRN Parks Ranger, DO       losartan (COZAAR) tablet 25 mg  25 mg Oral Daily Parks Ranger, DO   25 mg at 06/22/22 0857   magnesium hydroxide (MILK OF MAGNESIA) suspension 30 mL  30 mL Oral Daily PRN Parks Ranger, DO   30 mL at 06/15/22 0815   modafinil (PROVIGIL) tablet 100 mg  100 mg Oral Daily Parks Ranger, DO       multivitamin with minerals tablet 1 tablet  1 tablet Oral Daily Parks Ranger, DO   1 tablet at 06/22/22 0857   OLANZapine (ZYPREXA) tablet 5 mg  5 mg Oral QHS Parks Ranger, DO   5 mg at 06/21/22 2103   pantoprazole (PROTONIX) EC tablet 40 mg  40 mg Oral Daily Parks Ranger, DO   40 mg at 06/22/22 0857   polyethylene glycol (MIRALAX / GLYCOLAX) packet 17 g  17 g Oral Daily Parks Ranger, DO   17 g at 06/22/22 0856   risperiDONE  (RISPERDAL) tablet 0.5 mg  0.5 mg  Oral [id number] Parks Ranger, DO   0.5 mg at 06/22/22 9323   sulfamethoxazole-trimethoprim (BACTRIM DS) 800-160 MG per tablet 1 tablet  1 tablet Oral Q12H Parks Ranger, DO   1 tablet at 06/22/22 0857   traZODone (DESYREL) tablet 50 mg  50 mg Oral QHS PRN Parks Ranger, DO   50 mg at 06/16/22 2122   venlafaxine XR (EFFEXOR-XR) 24 hr capsule 150 mg  150 mg Oral Q breakfast Parks Ranger, DO   150 mg at 06/22/22 5573    Lab Results:  Results for orders placed or performed during the hospital encounter of 06/14/22 (from the past 48 hour(s))  Urinalysis, Routine w reflex microscopic Urine, Clean Catch     Status: Abnormal   Collection Time: 06/20/22 12:18 PM  Result Value Ref Range   Color, Urine YELLOW (A) YELLOW   APPearance HAZY (A) CLEAR   Specific Gravity, Urine 1.021 1.005 - 1.030   pH 5.0 5.0 - 8.0   Glucose, UA NEGATIVE NEGATIVE mg/dL   Hgb urine dipstick NEGATIVE NEGATIVE   Bilirubin Urine NEGATIVE NEGATIVE   Ketones, ur NEGATIVE NEGATIVE mg/dL   Protein, ur NEGATIVE NEGATIVE mg/dL   Nitrite NEGATIVE NEGATIVE   Leukocytes,Ua LARGE (A) NEGATIVE   RBC / HPF 0-5 0 - 5 RBC/hpf   WBC, UA 11-20 0 - 5 WBC/hpf   Bacteria, UA FEW (A) NONE SEEN   Squamous Epithelial / HPF 6-10 0 - 5 /HPF   Mucus PRESENT     Comment: Performed at [facility], [address] [place], [place] [address]  CBC with Differential/Platelet     Status: Abnormal   Collection Time: 06/20/22  1:50 PM  Result Value Ref Range   WBC 10.6 (H) 4.0 - 10.5 K/uL   RBC 4.49 3.87 - 5.11 MIL/uL   Hemoglobin 13.2 12.0 - 15.0 g/dL   HCT 40.7 36.0 - 46.0 %   MCV 90.6 80.0 - 100.0 fL   MCH 29.4 26.0 - 34.0 pg   MCHC 32.4 30.0 - 36.0 g/dL   RDW 13.1 11.5 - 15.5 %   Platelets 209 150 - 400 K/uL   nRBC 0.0 0.0 - 0.2 %   Neutrophils Relative % 71 %   Neutro Abs 7.7 1.7 - 7.7 K/uL   Lymphocytes Relative 15 %   Lymphs Abs 1.6 0.7 - 4.0 K/uL    Monocytes Relative 11 %   Monocytes Absolute 1.1 (H) 0.1 - 1.0 K/uL   Eosinophils Relative 1 %   Eosinophils Absolute 0.1 0.0 - 0.5 K/uL   Basophils Relative 1 %   Basophils Absolute 0.1 0.0 - 0.1 K/uL   Immature Granulocytes 1 %   Abs Immature Granulocytes 0.05 0.00 - 0.07 K/uL    Comment: Performed at [facility], [address] [place], [place] [address]  Comprehensive metabolic panel     Status: Abnormal   Collection Time: 06/20/22  1:50 PM  Result Value Ref Range   Sodium 139 135 - 145 mmol/L   Potassium 4.0 3.5 - 5.1 mmol/L   Chloride 102 98 - 111 mmol/L   CO2 28 22 - 32 mmol/L   Glucose, Bld 79 70 - 99 mg/dL    Comment: Glucose reference range applies only to samples taken after fasting for at least 8 hours.   BUN 20 8 - 23 mg/dL   Creatinine, Ser 0.81 0.44 - 1.00 mg/dL   Calcium 8.9 8.9 - 10.3 mg/dL   Total Protein  6.0 (L) 6.5 - 8.1 g/dL   Albumin 3.6 3.5 - 5.0 g/dL   AST 27 15 - 41 U/L   ALT 18 0 - 44 U/L   Alkaline Phosphatase 40 38 - 126 U/L   Total Bilirubin 0.7 0.3 - 1.2 mg/dL   GFR, Estimated >60 >60 mL/min    Comment: (NOTE) Calculated using the CKD-EPI Creatinine Equation (2021)    Anion gap 9 5 - 15    Comment: Performed at [facility], [address] [place], [place] [address]    Blood Alcohol level:  Lab Results  Component Value Date   [facility] <10 60/45/4098    Metabolic Disorder Labs: Lab Results  Component Value Date   HGBA1C 5.5 06/10/2022   MPG 111.15 06/10/2022   MPG 105 04/05/2021   No results found for: "PROLACTIN" Lab Results  Component Value Date   CHOL 246 (H) 06/10/2022   TRIG 94 06/10/2022   HDL 57 06/10/2022   CHOLHDL 4.3 06/10/2022   VLDL 19 06/10/2022   LDLCALC 170 (H) 06/10/2022   LDLCALC 67 04/05/2021    Physical Findings: AIMS:  , ,  ,  ,    CIWA:    COWS:     Musculoskeletal: Strength & Muscle Tone: within normal limits Gait & Station: unsteady Patient leans: N/A  Psychiatric Specialty  Exam:  Presentation  General Appearance:  Appropriate for Environment; Casual  Eye Contact: Good  Speech: Clear and Coherent; Normal Rate  Speech Volume: Normal  Handedness: Right   Mood and Affect  Mood: Dysphoric  Affect: Congruent   Thought Process  Thought Processes: Coherent  Descriptions of Associations:Intact  Orientation:Full (Time, Place and Person)  Thought Content:Logical  History of Schizophrenia/Schizoaffective disorder:No  Duration of Psychotic Symptoms:No data recorded Hallucinations:No data recorded Ideas of Reference:None  Suicidal Thoughts:No data recorded Homicidal Thoughts:No data recorded  Sensorium  Memory: Immediate Good; Recent Good; Remote Good  Judgment: Good  Insight: Good   Executive Functions  Concentration: Good  Attention Span: Good  Recall: Good  Fund of Knowledge: Good  Language: Good   Psychomotor Activity  Psychomotor Activity:No data recorded  Assets  Assets: Physical Health; Resilience; Social Support; Catering manager; Housing   Sleep  Sleep:No data recorded   Physical Exam: Physical Exam Vitals and nursing note reviewed.  Constitutional:      Appearance: Normal appearance. She is underweight.  Neurological:     General: No focal deficit present.     Mental Status: She is alert and oriented to person, place, and time.  Psychiatric:        Attention and Perception: Attention normal.        Mood and Affect: Mood is depressed. Affect is labile and flat.        Speech: Speech normal.        Behavior: Behavior normal. Behavior is cooperative.        Thought Content: Thought content normal.        Cognition and Memory: Memory normal. Cognition is impaired.        Judgment: Judgment normal.    Review of Systems  Constitutional: Negative.   HENT: Negative.    Eyes: Negative.   Respiratory: Negative.    Cardiovascular: Negative.   Gastrointestinal: Negative.    Genitourinary: Negative.   Musculoskeletal: Negative.   Skin: Negative.   Neurological: Negative.   Endo/Heme/Allergies: Negative.   Psychiatric/Behavioral: Negative.     Blood pressure 129/82, pulse (!) 106, temperature 97.8 F (36.6 C), temperature  source Oral, resp. rate 16, height '5\' 2"'$  (1.575 m), SpO2 98 %. Body mass index is 20.01 kg/m.   Treatment Plan Summary: Daily contact with patient to assess and evaluate symptoms and progress in treatment, Medication management, and Plan start Provigil 100 mg in the morning and ensure between meals.  Physical therapy consult tomorrow.  Parks Ranger, DO 06/22/2022, 11:07 AM

## 2022-06-22 NOTE — BHH Group Notes (Signed)
[employer], and psychoeducational group held.  Two poems were read, and patients were encouraged to use reflection of previous experiences to help identify their strengths and recognize negative thought patterns.  Pt attended. Pt thought content was confused, disorganized, she shared '' my son didn't do anything wrong, I don't know why I'm here. '' Pt redirected verbally and later shared appropriate thought content.

## 2022-06-22 NOTE — Group Note (Signed)
[facility] LCSW Group Therapy Note   Group Date: 06/22/2022 Start Time: 1320 End Time: 1400   Type of Therapy/Topic:  Group Therapy:  Balance in Life  Participation Level:  Did Not Attend   Description of Group:    This group will address the concept of balance and how it feels and looks when one is unbalanced. Patients will be encouraged to process areas in their lives that are out of balance, and identify reasons for remaining unbalanced. Facilitators will guide patients utilizing problem- solving interventions to address and correct the stressor making their life unbalanced. Understanding and applying boundaries will be explored and addressed for obtaining  and maintaining a balanced life. Patients will be encouraged to explore ways to assertively make their unbalanced needs known to significant others in their lives, using other group members and facilitator for support and feedback.  Therapeutic Goals: Patient will identify two or more emotions or situations they have that consume much of in their lives. Patient will identify signs/triggers that life has become out of balance:  Patient will identify two ways to set boundaries in order to achieve balance in their lives:  Patient will demonstrate ability to communicate their needs through discussion and/or role plays  Summary of Patient Progress:  X    Therapeutic Modalities:   Cognitive Behavioral Therapy Solution-Focused Therapy Assertiveness Training   [place], LCSWA

## 2022-06-22 NOTE — BHH Group Notes (Signed)
Activity group of light exercises completed. Pt attended and was appropriate.

## 2022-06-22 NOTE — Progress Notes (Signed)
Patient is alert and oriented times 1. Mood and affect sullen and anxious. Patient denies pain. UTA due to cognition. Morning meds given whole by mouth with applesauce. Appetite is poor- only eats 25% of meals. Patient continues ABT for UTI.  Patient remains on unit with Q15 minute checks in place.

## 2022-06-22 NOTE — Progress Notes (Signed)
   06/22/22 0821  Psych Admission Type (Psych Patients Only)  Admission Status Involuntary  Psychosocial Assessment  Eye Contact Fair  Facial Expression Anxious  Affect Anxious  Speech Logical/coherent  Interaction Minimal  Motor Activity Slow  Appearance/Hygiene Unremarkable  Thought Process  Coherency Unable to assess  Content UTA  Delusions UTA  Perception UTA  Hallucination UTA  Judgment Limited  Confusion Severe  Danger to Self  Current suicidal ideation? Denies  Danger to Others  Danger to Others None reported or observed

## 2022-06-22 NOTE — Plan of Care (Signed)
  Problem: Education: Goal: Knowledge of General Education information will improve Description: Including pain rating scale, medication(s)/side effects and non-pharmacologic comfort measures Outcome: Progressing   Problem: Health Behavior/Discharge Planning: Goal: Ability to manage health-related needs will improve Outcome: Progressing   Problem: Clinical Measurements: Goal: Ability to maintain clinical measurements within normal limits will improve Outcome: Progressing Goal: Will remain free from infection Outcome: Progressing Goal: Diagnostic test results will improve Outcome: Progressing Goal: Respiratory complications will improve Outcome: Progressing Goal: Cardiovascular complication will be avoided Outcome: Progressing   Problem: Activity: Goal: Risk for activity intolerance will decrease Outcome: Progressing   Problem: Nutrition: Goal: Adequate nutrition will be maintained Outcome: Progressing   Problem: Coping: Goal: Level of anxiety will decrease Outcome: Progressing   Problem: Elimination: Goal: Will not experience complications related to bowel motility Outcome: Progressing Goal: Will not experience complications related to urinary retention Outcome: Progressing   Problem: Pain Managment: Goal: General experience of comfort will improve Outcome: Progressing   Problem: Safety: Goal: Ability to remain free from injury will improve Outcome: Progressing   Problem: Self-Concept: Goal: Will verbalize positive feelings about self Outcome: Progressing Goal: Level of anxiety will decrease Outcome: Progressing   Problem: Safety: Goal: Ability to disclose and discuss suicidal ideas will improve Outcome: Progressing Goal: Ability to identify and utilize support systems that promote safety will improve Outcome: Progressing   Problem: Role Relationship: Goal: Will demonstrate positive changes in social behaviors and relationships Outcome: Progressing

## 2022-06-23 ENCOUNTER — Inpatient Hospital Stay: Payer: Medicare PPO

## 2022-06-23 DIAGNOSIS — F332 Major depressive disorder, recurrent severe without psychotic features: Secondary | ICD-10-CM | POA: Diagnosis not present

## 2022-06-23 LAB — COMPREHENSIVE METABOLIC PANEL
ALT: 20 U/L (ref 0–44)
AST: 29 U/L (ref 15–41)
Albumin: 4 g/dL (ref 3.5–5.0)
Alkaline Phosphatase: 48 U/L (ref 38–126)
Anion gap: 10 (ref 5–15)
BUN: 22 mg/dL (ref 8–23)
CO2: 24 mmol/L (ref 22–32)
Calcium: 9 mg/dL (ref 8.9–10.3)
Chloride: 103 mmol/L (ref 98–111)
Creatinine, Ser: 0.9 mg/dL (ref 0.44–1.00)
GFR, Estimated: >60 mL/min (ref >60)
Glucose, Bld: 144 mg/dL — ABNORMAL HIGH (ref 70–99)
Potassium: 4.1 mmol/L (ref 3.5–5.1)
Sodium: 137 mmol/L (ref 135–145)
Total Bilirubin: 0.5 mg/dL (ref 0.3–1.2)
Total Protein: 6.9 g/dL (ref 6.5–8.1)

## 2022-06-23 LAB — URINALYSIS, ROUTINE W REFLEX MICROSCOPIC
Bacteria, UA: NONE SEEN
Bilirubin Urine: NEGATIVE
Glucose, UA: NEGATIVE mg/dL
Hgb urine dipstick: NEGATIVE
Ketones, ur: NEGATIVE mg/dL
Nitrite: NEGATIVE
Protein, ur: NEGATIVE mg/dL
Specific Gravity, Urine: 1.014 (ref 1.005–1.030)
pH: 7 (ref 5.0–8.0)

## 2022-06-23 LAB — CBC WITH DIFFERENTIAL/PLATELET
Abs Immature Granulocytes: 0.03 10*3/uL (ref 0.00–0.07)
Basophils Absolute: 0 10*3/uL (ref 0.0–0.1)
Basophils Relative: 0 %
Eosinophils Absolute: 0.1 10*3/uL (ref 0.0–0.5)
Eosinophils Relative: 1 %
HCT: 42.8 % (ref 36.0–46.0)
Hemoglobin: 13.8 g/dL (ref 12.0–15.0)
Immature Granulocytes: 0 %
Lymphocytes Relative: 12 %
Lymphs Abs: 1.1 10*3/uL (ref 0.7–4.0)
MCH: 29.3 pg (ref 26.0–34.0)
MCHC: 32.2 g/dL (ref 30.0–36.0)
MCV: 90.9 fL (ref 80.0–100.0)
Monocytes Absolute: 0.8 10*3/uL (ref 0.1–1.0)
Monocytes Relative: 9 %
Neutro Abs: 7.1 10*3/uL (ref 1.7–7.7)
Neutrophils Relative %: 78 %
Platelets: 221 10*3/uL (ref 150–400)
RBC: 4.71 MIL/uL (ref 3.87–5.11)
RDW: 13.3 % (ref 11.5–15.5)
WBC: 9.1 10*3/uL (ref 4.0–10.5)
nRBC: 0 % (ref 0.0–0.2)

## 2022-06-23 MED ORDER — OLANZAPINE 5 MG PO TABS
10.0000 mg | ORAL_TABLET | Freq: Every day | ORAL | Status: DC
  Administered 2022-06-23 – 2022-06-28 (×6): 10 mg via ORAL
  Filled 2022-06-23 (×6): qty 2

## 2022-06-23 MED ORDER — OLANZAPINE 5 MG PO TABS: 7.5000 mg | ORAL_TABLET | Freq: Every day | ORAL | Status: DC

## 2022-06-23 NOTE — Group Note (Signed)
[facility] LCSW Group Therapy Note   Group Date: 06/23/2022 Start Time: 2244 End Time: 1500  Type of Therapy and Topic:  Group Therapy:  Feelings around Relapse and Recovery  Participation Level:  Minimal   Mood:  Description of Group:    Patients in this group will discuss emotions they experience before and after a relapse. They will process how experiencing these feelings, or avoidance of experiencing them, relates to having a relapse. Facilitator will guide patients to explore emotions they have related to recovery. Patients will be encouraged to process which emotions are more powerful. They will be guided to discuss the emotional reaction significant others in their lives may have to patients' relapse or recovery. Patients will be assisted in exploring ways to respond to the emotions of others without this contributing to a relapse.  Therapeutic Goals: Patient will identify two or more emotions that lead to relapse for them:  Patient will identify two emotions that result when they relapse:  Patient will identify two emotions related to recovery:  Patient will demonstrate ability to communicate their needs through discussion and/or role plays.   Summary of Patient Progress:  Patient was present for a portion of the group session. Patient was an active listener but did not participate in the topic of discussion, nor provided helpful advice to others, and added nuance to topic of conversation.   CSW led participants in assessing self-care to aid in development of a personal recovery model. She completed activity but declined to share with the group.    Therapeutic Modalities:   Cognitive Behavioral Therapy Solution-Focused Therapy Assertiveness Training Relapse Prevention Therapy   Gwendolynn Merkey A [place], LCSWA

## 2022-06-23 NOTE — Progress Notes (Signed)
1:1 Hourly Rounding  0730: Pt in bedroom calm and composed with sitter at side.  0830: Pt in dayroom calm and composed with sitter at side.  0930: Pt in dayroom calm and composed with sitter at side.  1030: Pt in dayroom calm and composed with sitter at side.  1130: Pt in dayroom calm and composed with sitter at side.  1230: Pt in hallway calm and composed using phone with sitter at side.  1330: Pt in dayroom calm and composed with sitter at side.  1430: Pt in hallway calm and composed with sitter at side.   1530: Pt in dayroom calm and composed with sitter at side.  1630: Pt in dayroom calm and composed with sitter at side.  1730: Pt in dayroom calm and composed with sitter at side.  1830: Pt in dayroom calm and composed with sitter at side.  1900: Pt in dayroom calm and composed, visiting with visitor, sitter at side.

## 2022-06-23 NOTE — Progress Notes (Signed)
Pt refused ensures stating she is too full and did not want them at this time, MD notified.

## 2022-06-23 NOTE — Progress Notes (Signed)
1-1 monitoring note.  Patient remains with unsteady gait at times, however affect and paranoia have improved at this time. Pt did attend breakfast, and ate 90% of meal. She also attended group and participated appropriately, however she has become less accusatory and paranoid of staff at this time. Pt allowed blood draw and provided urine and complied with CT scan.  Pt also assisted with shower, peri care completed, and outfit change.  Pt is safe, will con't to monitor on 1.1. remains in the care of primary RN.

## 2022-06-23 NOTE — Progress Notes (Signed)
1-1 Progress note.  Patient approached this am and appears very confused, paranoid and agitated. When writer introduced self to patient (sat with patient yesterday) pt states '' I know what ya'll are doing out, there , smiling and laughing and making fun of everything. Don't I have to sign to put my picture out there? I don't know what they think tom did but torturing a man and hearing him screaming. He graduated at [facility] [employer] [facility] [employer] second to top of his class. And it's just not right. I need the papers. You smile but I know no one is to be trusted, everyone says it's not true but I know what is going on here. '' Pt appears more paranoid, agitated and fearful of staff. Also of note, her gait is more unsteady and she believed she had her shoes on but did not. She came to bed and began picking at the sheets stating writer had placed bugs on her bed '' to trick her , I know what you're doing ''  Pt continues to require frequent verbal redirection. Staff at bedside. MD and RN assigned to pt notified. Will con't to monitor.

## 2022-06-23 NOTE — Evaluation (Signed)
Physical Therapy Evaluation Patient Details Name: Cathy [name] MRN: [id number] DOB: 05-03-1946 Today's Date: 06/23/2022  History of Present Illness  Pt is a 77 y.o. female presenting "voluntarily to [facility] reporting worsening depressive symptoms and suicidal ideation" 06/10/22; transferred to [facility] 06/14/22.  Unwitnessed fall 06/18/22 hitting head.  PMH includes htn, vision changes, L TKA, lymphoma, depression, anxiety.  Clinical Impression  Prior to hospital admission, pt reports being independent with functional mobility; lives alone in 1 level home with 1 small step to enter.  Intact B LE heel to shin coordination.  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  Currently pt is independent with bed mobility; CGA with transfers; and CGA to min assist to ambulate 100 feet with RW and 200 feet without AD use/UE support.  Pt appearing impulsive during sessions activities and with intermittent loss of balance with or without RW use (loss of balance noted more with turns and quick movements) requiring assist to regain balance; pt also requiring cueing for safe use of walker.  Pt would benefit from skilled PT to address noted impairments and functional limitations (see below for any additional details).  Upon hospital discharge, pt would benefit from SNF to improve balance but pt may be able to discharge home if has 24/7 assist for functional mobility.    Recommendations for follow up therapy are one component of a multi-disciplinary discharge planning process, led by the attending physician.  Recommendations may be updated based on patient status, additional functional criteria and insurance authorization.  Follow Up Recommendations Skilled nursing-short term rehab (<3 hours/day) Can patient physically be transported by private vehicle: Yes    Assistance  Recommended at Discharge Frequent or constant Supervision/Assistance  Patient can return home with the following  A little help with walking and/or transfers;A little help with bathing/dressing/bathroom;Assistance with cooking/housework;Direct supervision/assist for medications management;Assist for transportation;Help with stairs or ramp for entrance    Equipment Recommendations Rolling walker (2 wheels)  Recommendations for Other Services       Functional Status Assessment Patient has had a recent decline in their functional status and demonstrates the ability to make significant improvements in function in a reasonable and predictable amount of time.     Precautions / Restrictions Precautions Precautions: Fall Restrictions Weight Bearing Restrictions: No      Mobility  Bed Mobility Overal bed mobility: Independent             General bed mobility comments: no difficulties noted supine to/from sitting    Transfers Overall transfer level: Needs assistance Equipment used: None Transfers: Sit to/from Stand Sit to Stand: Min guard           General transfer comment: steady stand from bed x2 trials    Ambulation/Gait Ambulation/Gait assistance: Min guard, Min assist Gait Distance (Feet):  (200 feet no AD use; 100 feet with RW use) Assistive device: None, Rolling walker (2 wheels)         General Gait Details: pt appearing a little more steady ambulating with RW but pt still with occasional loss of balance either with RW or without UE support walking requiring min assist for balance  Stairs            Wheelchair Mobility    Modified Rankin (Stroke Patients Only)       Balance Overall balance assessment:  Needs assistance Sitting-balance support: Feet supported, No upper extremity supported Sitting balance-Leahy Scale: Normal Sitting balance - Comments: steady sitting reaching outside BOS   Standing balance support: No upper extremity supported,  During functional activity Standing balance-Leahy Scale: Poor Standing balance comment: min assist to steady at times d/t loss of balance                             Pertinent Vitals/Pain Pain Assessment Pain Assessment: No/denies pain Vitals (HR and O2 on room air) stable and WFL throughout treatment session.    Home Living Family/patient expects to be discharged to:: Private residence Living Arrangements: Alone Available Help at Discharge: Family;Available PRN/intermittently Type of Home: House Home Access: Stairs to enter Entrance Stairs-Rails: None Entrance Stairs-Number of Steps: 1 small step from main back entrance   Home Layout: One level Home Equipment: Grab bars - tub/shower;Grab bars - toilet      Prior Function Prior Level of Function : Independent/Modified Independent             Mobility Comments: Pt reports 1 recent fall during hospitalization but no other falls.  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       Hand Dominance        Extremity/Trunk Assessment   Upper Extremity Assessment Upper Extremity Assessment: Defer to OT evaluation;Overall WFL for tasks assessed    Lower Extremity Assessment Lower Extremity Assessment: Generalized weakness (intact B LE heel to shin coordination)    Cervical / Trunk Assessment Cervical / Trunk Assessment: Normal  Communication   Communication: No difficulties  Cognition Arousal/Alertness: Awake/alert Behavior During Therapy: Impulsive Overall Cognitive Status: No family/caregiver present to determine baseline cognitive functioning                                 General Comments: Oriented to person, month/year (reported it was the 12th).  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        General Comments General comments (skin integrity, edema, etc.): bruise noted to forehead     Exercises     Assessment/Plan    PT Assessment Patient needs continued PT services  PT Problem List Decreased strength;Decreased balance;Decreased mobility;Decreased coordination;Decreased cognition;Decreased knowledge of use of DME;Decreased safety awareness;Decreased knowledge of precautions       PT Treatment Interventions DME instruction;Gait training;Functional mobility training;Therapeutic activities;Therapeutic exercise;Balance training;Patient/family education;Stair training    PT Goals (Current goals can be found in the Care Plan section)  Acute Rehab PT Goals Patient Stated Goal: to improve balance PT Goal Formulation: With patient Time For Goal Achievement: 07/07/22 Potential to Achieve Goals: Good    Frequency Min 2X/week     Co-evaluation               AM-PAC PT "6 Clicks" Mobility  Outcome Measure Help needed turning from your back to your side while in a flat bed without using bedrails?: None Help needed moving from lying on your back to sitting on the side of a flat bed without using bedrails?: None Help needed moving to and from a bed to a chair (including a wheelchair)?: A Little Help needed standing up from a chair using your arms (e.g., wheelchair or bedside chair)?: A Little Help needed to walk in hospital room?: A Little  Help needed climbing 3-5 steps with a railing? : A Little 6 Click Score: 20    End of Session Equipment Utilized During Treatment: Gait belt Activity Tolerance: Patient tolerated treatment well Patient left: Other (comment) (ambulating with RW with sitter assisting pt) Nurse Communication: Mobility status;Precautions PT Visit Diagnosis: Unsteadiness on feet (R26.81);Muscle weakness (generalized) (M62.81);History of falling (Z91.81)    Time: 3128-1188 PT Time Calculation (min) (ACUTE ONLY): 15 min   Charges:   PT Evaluation $PT Eval Low Complexity: 1 Low PT Treatments $Gait Training: 8-22 mins       Leitha Bleak,  PT 06/23/22, 3:08 PM

## 2022-06-23 NOTE — Progress Notes (Addendum)
On initial assessment, patient denies SI, HI, and AVH. She is alert and oriented x3. She denies pain and other physical problems. Patient is compliant with scheduled medications, which were given in applesauce.  Patient was in the dayroom with a visitor until 8pm. After visitation, patient went to her room and has been in bed. She sleeps intermittently, but has auditory and visual hallucinations throughout the night. PRN Ativan given for anxiety at 1:03am. 1:1 sitter remains at bedside for safety.

## 2022-06-23 NOTE — [facility] Group Notes (Signed)
Psychoeducational group completed.  Patients were given two poems to read, and asked within the framework of cognitive behavioral therapy and positive affirmations,  to identify one positive thought about themselves and one negative belief they would like to let go of.  Pt participated and was appropriate.

## 2022-06-23 NOTE — Evaluation (Signed)
Occupational Therapy Evaluation Patient Details Name: Cathy [name] MRN: [id number] DOB: 1945/07/27 Today's Date: 06/23/2022   History of Present Illness Pt is a 77 y.o. female presenting "voluntarily to [facility] reporting worsening depressive symptoms and suicidal ideation" 06/10/22; transferred to [facility] 06/14/22.  Unwitnessed fall 06/18/22 hitting head.  PMH includes htn, vision changes, L TKA, lymphoma, depression, anxiety.   Clinical Impression   Patient presenting with decreased Ind in self care,balance, functional mobility/transfers, endurance, and safety awareness. Patient reports being Ind at baseline and living at home alone. 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 Pt is oriented to self, month, and year only. Pt needing to be re-directed during session as she asks multiple time to call her son to tell him "what school to pick me up from". Pt needing cues throughout session for safety awareness requiring 24/7 supervision at discharge. Pt demonstrated toileting tasks and grooming while standing with min guard - min A without use of AD. Pt given RW and needs continuous cuing to leave RW on floor and to step within it.  Patient will benefit from acute OT to increase overall independence in the areas of ADLs, functional mobility, and safety awareness  in order to safely discharge to next venue of care.     Recommendations for follow up therapy are one component of a multi-disciplinary discharge planning process, led by the attending physician.  Recommendations may be updated based on patient status, additional functional criteria and insurance authorization.   Follow Up Recommendations  Skilled nursing-short term rehab (<3 hours/day)     Assistance Recommended at Discharge Frequent or constant Supervision/Assistance  Patient can return home with the following A little help with walking and/or transfers;A little help with bathing/dressing/bathroom;Help with stairs or ramp for  entrance;Assist for transportation;Direct supervision/assist for financial management;Direct supervision/assist for medications management;Assistance with cooking/housework    Functional Status Assessment  Patient has had a recent decline in their functional status and demonstrates the ability to make significant improvements in function in a reasonable and predictable amount of time.  Equipment Recommendations  Other (comment) (RW)       Precautions / Restrictions Precautions Precautions: Fall Restrictions Weight Bearing Restrictions: No      Mobility Bed Mobility Overal bed mobility: Independent                  Transfers Overall transfer level: Needs assistance Equipment used: None Transfers: Sit to/from Stand Sit to Stand: Min guard                  Balance Overall balance assessment: Needs assistance Sitting-balance support: Feet supported, No upper extremity supported Sitting balance-Leahy Scale: Normal Sitting balance - Comments: steady sitting reaching outside BOS   Standing balance support: No upper extremity supported, During functional activity Standing balance-Leahy Scale: Poor                             ADL either performed or assessed with clinical judgement   ADL Overall ADL's : Needs assistance/impaired     Grooming: Wash/dry hands;Wash/dry face;Oral care;Standing;Min guard                   Toilet Transfer: Nature conservation officer                   Vision Patient Visual Report: No change from baseline  Pertinent Vitals/Pain Pain Assessment Pain Assessment: No/denies pain        Extremity/Trunk Assessment Upper Extremity Assessment Upper Extremity Assessment: Overall WFL for tasks assessed   Lower Extremity Assessment Lower Extremity Assessment: Generalized weakness   Cervical / Trunk Assessment Cervical / Trunk Assessment: Normal   Communication Communication Communication: No  difficulties   Cognition Arousal/Alertness: Awake/alert Behavior During Therapy: Impulsive Overall Cognitive Status: No family/caregiver present to determine baseline cognitive functioning                                 General Comments: Pt is oriented to self, year, and month. She believes herself to be at school and requesting to call her son to tell him what school to pick her up from.     General Comments  bruise noted to forehead            Home Living Family/patient expects to be discharged to:: Private residence Living Arrangements: Alone Available Help at Discharge: Family;Available PRN/intermittently Type of Home: House Home Access: Stairs to enter [employer] of Steps: 1 small step from main back entrance Entrance Stairs-Rails: None Home Layout: One level     Bathroom Shower/Tub: Teacher, early years/pre: Standard     Home Equipment: Grab bars - tub/shower;Grab bars - toilet          Prior Functioning/Environment Prior Level of Function : Independent/Modified Independent;Driving             Mobility Comments: Pt reports 1 recent fall during hospitalization but no other falls.  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 ADLs Comments: Ind in all ADLs and IADLs        OT Problem List: Decreased strength;Decreased activity tolerance;Impaired balance (sitting and/or standing);Decreased safety awareness;Decreased knowledge of use of DME or AE;Decreased cognition      OT Treatment/Interventions: Self-care/ADL training;Therapeutic exercise;Therapeutic activities;DME and/or AE instruction;Balance training;Patient/family education;Energy conservation    OT Goals(Current goals can be found in the care plan section) Acute Rehab OT Goals Patient Stated Goal: "my son to pick me up from school" OT Goal Formulation: With patient Time For Goal Achievement: 07/07/22 Potential to Achieve Goals: Good ADL Goals Pt Will  Perform Grooming: with modified independence;standing Pt Will Transfer to Toilet: with supervision;ambulating Pt Will Perform Toileting - Clothing Manipulation and hygiene: with supervision;sit to/from stand  OT Frequency: Min 2X/week       AM-PAC OT "6 Clicks" Daily Activity     Outcome Measure Help from another person eating meals?: None Help from another person taking care of personal grooming?: A Little Help from another person toileting, which includes using toliet, bedpan, or urinal?: A Little Help from another person bathing (including washing, rinsing, drying)?: A Little Help from another person to put on and taking off regular upper body clothing?: None Help from another person to put on and taking off regular lower body clothing?: A Little 6 Click Score: 20   End of Session Equipment Utilized During Treatment: Rolling walker (2 wheels) Nurse Communication: Mobility status;Other (comment) (given RW)  Activity Tolerance: Patient tolerated treatment well Patient left: Other (comment) (left with staff assisting her with making a phone call)  OT Visit Diagnosis: Unsteadiness on feet (R26.81);Repeated falls (R29.6);Muscle weakness (generalized) (M62.81)                Time: 4098-1191 OT Time Calculation (min): 32 min Charges:  OT General Charges $OT Visit:  1 Visit OT Evaluation $OT Eval Moderate Complexity: 1 Mod OT Treatments $Self Care/Home Management : 8-22 mins $Therapeutic Activity: 8-22 mins  Darleen Crocker, MS, OTR/L , CBIS ascom [phone]  06/23/22, 4:07 PM

## 2022-06-23 NOTE — Progress Notes (Signed)
[facility] MD Progress Note  06/23/2022 12:14 PM Cathy [name]  MRN:  [id number] Subjective: Cathy [name] is seen on rounds.  I had a call from the nurses this morning that she was agitated and confused.  I did some lab work which came back normal.  Her CT also came back normal.  She is alert and oriented and interacting with staff and peers and eating appropriately.  She seems to have some hallucinations at night and trouble sleeping.  Lilia Pro to go up on her Zyprexa.  We will get OT and PT to see her.  She has these delusions that something is wrong with her son.  She tells me that he got divorced last week which I am not sure if that is true or not.  She seems to be very fixated and possibly delusional about her son and talks to the nurses about it also.  Principal Problem: Major depressive disorder, recurrent episode, severe (HCC) Diagnosis: Principal Problem:   Major depressive disorder, recurrent episode, severe ([place])  Total Time spent with patient: 15 minutes  Past Psychiatric History:  She has been on Lexapro and Trintellix prescribed by her PCP.  No past psychiatric hospitalizations and no past suicide attempts.    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   Vision changes     Past Surgical History:  Procedure Laterality Date   CHOLECYSTECTOMY     IR IMAGING GUIDED PORT INSERTION  09/21/2020   LITHOTRIPSY  1984   LYMPH NODE BIOPSY     MASS BIOPSY Left 09/03/2020    Procedure: LEFT NECK JUGULAR NODE;  Surgeon: Rozetta Nunnery, MD;  Location: [place];  Service: ENT;  Laterality: Left;   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   Procedure: LEFT TOTAL KNEE ARTHROPLASTY;  Surgeon: Gaynelle Arabian, MD;  Location: [facility];  Service: Orthopedics;  Laterality: Left;   Family History:  Family History  Problem Relation Age of Onset   Hypertension Mother    Stroke Mother    Heart attack Mother        CABG, 5 Stents   Dementia Mother    Cancer Father        Kidney   Asthma Sister    Diabetes Brother        Borderline   Alcohol abuse Brother    Cancer Maternal Aunt        stomach ca   Cancer Maternal Aunt        ovarian ca   Breast cancer Cousin    Colon cancer Neg Hx    Family Psychiatric  History: Unremarkable Social History:  Social History   Substance and Sexual Activity  Alcohol Use No     Social History   Substance and Sexual Activity  Drug Use No    Social History   Socioeconomic History   Marital  status: Widowed    Spouse name: Not on file   Number of children: Not on file   Years of education: Not on file   Highest education level: Not on file  Occupational History   Not on file  Tobacco Use   Smoking status: Never   Smokeless tobacco: Never  Substance and Sexual Activity   Alcohol use: No   Drug use: No   Sexual activity: Not Currently    Birth control/protection: Post-menopausal  Other Topics Concern   Not on file  Social History Narrative   Lives alone.     Son is [name].     Social Determinants of Health   Financial Resource Strain: Not on file  Food Insecurity: No Food Insecurity (06/14/2022)   Hunger Vital Sign    Worried About Running Out of Food in the Last Year: Never true    Ran Out of Food in the Last Year: Never true  Transportation Needs: No Transportation Needs (06/14/2022)   PRAPARE - Hydrologist (Medical): No    Lack of Transportation  (Non-Medical): No  Physical Activity: Not on file  Stress: Not on file  Social Connections: Not on file   Additional Social History:                         Sleep: Poor  Appetite:  Fair  Current Medications: Current Facility-Administered Medications  Medication Dose Route Frequency Provider Last Rate Last Admin   acetaminophen (TYLENOL) tablet 650 mg  650 mg Oral Q6H PRN Parks Ranger, DO   650 mg at 06/20/22 2121   alum & mag hydroxide-simeth (MAALOX/MYLANTA) 200-200-20 MG/5ML suspension 30 mL  30 mL Oral Q4H PRN Parks Ranger, DO       aspirin EC tablet 81 mg  81 mg Oral Daily Parks Ranger, DO   81 mg at 06/23/22 0945   atorvastatin (LIPITOR) tablet 20 mg  20 mg Oral Daily Parks Ranger, DO   20 mg at 06/23/22 0945   cholecalciferol (VITAMIN D3) 25 MCG (1000 UNIT) tablet 1,000 Units  1,000 Units Oral Daily Parks Ranger, DO   1,000 Units at 06/23/22 6160   cyanocobalamin (VITAMIN B12) tablet 1,000 mcg  1,000 mcg Oral Daily Parks Ranger, DO   1,000 mcg at 06/23/22 0945   feeding supplement (ENSURE ENLIVE / ENSURE PLUS) liquid 237 mL  237 mL Oral TID BM Parks Ranger, DO   237 mL at 06/23/22 1005   LORazepam (ATIVAN) tablet 1 mg  1 mg Oral Q4H PRN Parks Ranger, DO   1 mg at 06/23/22 0103   Or   LORazepam (ATIVAN) injection 1 mg  1 mg Intramuscular Q4H PRN Parks Ranger, DO       losartan (COZAAR) tablet 25 mg  25 mg Oral Daily Parks Ranger, DO   25 mg at 06/23/22 0946   magnesium hydroxide (MILK OF MAGNESIA) suspension 30 mL  30 mL Oral Daily PRN Parks Ranger, DO   30 mL at 06/15/22 0815   modafinil (PROVIGIL) tablet 100 mg  100 mg Oral Daily Parks Ranger, DO   100 mg at 06/23/22 0945   multivitamin with minerals tablet 1 tablet  1 tablet Oral Daily Parks Ranger, DO   1 tablet at 06/23/22 0945   OLANZapine (ZYPREXA) tablet 10 mg  10 mg Oral  QHS Parks Ranger, DO  pantoprazole (PROTONIX) EC tablet 40 mg  40 mg Oral Daily Parks Ranger, DO   40 mg at 06/23/22 0946   polyethylene glycol (MIRALAX / GLYCOLAX) packet 17 g  17 g Oral Daily Parks Ranger, DO   17 g at 06/23/22 0945   risperiDONE (RISPERDAL) tablet 0.5 mg  0.5 mg Oral [id number] Parks Ranger, DO   0.5 mg at 06/23/22 0945   sulfamethoxazole-trimethoprim (BACTRIM DS) 800-160 MG per tablet 1 tablet  1 tablet Oral Q12H Parks Ranger, DO   1 tablet at 06/23/22 0946   traZODone (DESYREL) tablet 50 mg  50 mg Oral QHS PRN Parks Ranger, DO   50 mg at 06/16/22 2122   venlafaxine XR (EFFEXOR-XR) 24 hr capsule 150 mg  150 mg Oral Q breakfast Parks Ranger, DO   150 mg at 06/23/22 0814    Lab Results:  Results for orders placed or performed during the hospital encounter of 06/14/22 (from the past 48 hour(s))  Urinalysis, Routine w reflex microscopic Urine, Clean Catch     Status: Abnormal   Collection Time: 06/23/22  8:24 AM  Result Value Ref Range   Color, Urine YELLOW (A) YELLOW   APPearance CLEAR (A) CLEAR   Specific Gravity, Urine 1.014 1.005 - 1.030   pH 7.0 5.0 - 8.0   Glucose, UA NEGATIVE NEGATIVE mg/dL   Hgb urine dipstick NEGATIVE NEGATIVE   Bilirubin Urine NEGATIVE NEGATIVE   Ketones, ur NEGATIVE NEGATIVE mg/dL   Protein, ur NEGATIVE NEGATIVE mg/dL   Nitrite NEGATIVE NEGATIVE   Leukocytes,Ua TRACE (A) NEGATIVE   RBC / HPF 0-5 0 - 5 RBC/hpf   WBC, UA 0-5 0 - 5 WBC/hpf   Bacteria, UA NONE SEEN NONE SEEN   Squamous Epithelial / HPF 0-5 0 - 5 /HPF    Comment: Performed at [facility], [address] [place], [place] [address]  CBC with Differential/Platelet     Status: None   Collection Time: 06/23/22  8:57 AM  Result Value Ref Range   WBC 9.1 4.0 - 10.5 K/uL   RBC 4.71 3.87 - 5.11 MIL/uL   Hemoglobin 13.8 12.0 - 15.0 g/dL   HCT 42.8 36.0 - 46.0 %   MCV 90.9 80.0 - 100.0 fL    MCH 29.3 26.0 - 34.0 pg   MCHC 32.2 30.0 - 36.0 g/dL   RDW 13.3 11.5 - 15.5 %   Platelets 221 150 - 400 K/uL   nRBC 0.0 0.0 - 0.2 %   Neutrophils Relative % 78 %   Neutro Abs 7.1 1.7 - 7.7 K/uL   Lymphocytes Relative 12 %   Lymphs Abs 1.1 0.7 - 4.0 K/uL   Monocytes Relative 9 %   Monocytes Absolute 0.8 0.1 - 1.0 K/uL   Eosinophils Relative 1 %   Eosinophils Absolute 0.1 0.0 - 0.5 K/uL   Basophils Relative 0 %   Basophils Absolute 0.0 0.0 - 0.1 K/uL   Immature Granulocytes 0 %   Abs Immature Granulocytes 0.03 0.00 - 0.07 K/uL    Comment: Performed at [facility], [address] [place], [place] [address]  Comprehensive metabolic panel     Status: Abnormal   Collection Time: 06/23/22  8:57 AM  Result Value Ref Range   Sodium 137 135 - 145 mmol/L   Potassium 4.1 3.5 - 5.1 mmol/L   Chloride 103 98 - 111 mmol/L   CO2 24 22 - 32 mmol/L   Glucose, Bld 144 (  H) 70 - 99 mg/dL    Comment: Glucose reference range applies only to samples taken after fasting for at least 8 hours.   BUN 22 8 - 23 mg/dL   Creatinine, Ser 0.90 0.44 - 1.00 mg/dL   Calcium 9.0 8.9 - 10.3 mg/dL   Total Protein 6.9 6.5 - 8.1 g/dL   Albumin 4.0 3.5 - 5.0 g/dL   AST 29 15 - 41 U/L   ALT 20 0 - 44 U/L   Alkaline Phosphatase 48 38 - 126 U/L   Total Bilirubin 0.5 0.3 - 1.2 mg/dL   GFR, Estimated >60 >60 mL/min    Comment: (NOTE) Calculated using the CKD-EPI Creatinine Equation (2021)    Anion gap 10 5 - 15    Comment: Performed at [facility], [address] [place], [place] [address]    Blood Alcohol level:  Lab Results  Component Value Date   [facility] <10 82/50/5397    Metabolic Disorder Labs: Lab Results  Component Value Date   HGBA1C 5.5 06/10/2022   MPG 111.15 06/10/2022   MPG 105 04/05/2021   No results found for: "PROLACTIN" Lab Results  Component Value Date   CHOL 246 (H) 06/10/2022   TRIG 94 06/10/2022   HDL 57 06/10/2022   CHOLHDL 4.3 06/10/2022   VLDL 19 06/10/2022    LDLCALC 170 (H) 06/10/2022   LDLCALC 67 04/05/2021    Physical Findings: AIMS:  , ,  ,  ,    CIWA:    COWS:     Musculoskeletal: Strength & Muscle Tone: within normal limits Gait & Station: unsteady Patient leans: N/A  Psychiatric Specialty Exam:  Presentation  General Appearance:  Appropriate for Environment; Casual  Eye Contact: Good  Speech: Clear and Coherent; Normal Rate  Speech Volume: Normal  Handedness: Right   Mood and Affect  Mood: Dysphoric  Affect: Congruent   Thought Process  Thought Processes: Coherent  Descriptions of Associations:Intact  Orientation:Full (Time, Place and Person)  Thought Content:Logical  History of Schizophrenia/Schizoaffective disorder:No  Duration of Psychotic Symptoms:No data recorded Hallucinations:No data recorded Ideas of Reference:None  Suicidal Thoughts:No data recorded Homicidal Thoughts:No data recorded  Sensorium  Memory: Immediate Good; Recent Good; Remote Good  Judgment: Good  Insight: Good   Executive Functions  Concentration: Good  Attention Span: Good  Recall: Good  Fund of Knowledge: Good  Language: Good   Psychomotor Activity  Psychomotor Activity:No data recorded  Assets  Assets: Physical Health; Resilience; Social Support; Catering manager; Housing   Sleep  Sleep:No data recorded   Physical Exam: Physical Exam Vitals and nursing note reviewed.  Constitutional:      Appearance: Normal appearance. She is normal weight.  Neurological:     General: No focal deficit present.     Mental Status: She is alert and oriented to person, place, and time.  Psychiatric:        Attention and Perception: Attention normal. She perceives visual hallucinations.        Mood and Affect: Mood is depressed. Affect is flat.        Speech: Speech normal.        Behavior: Behavior normal. Behavior is cooperative.        Thought Content: Thought content is paranoid  and delusional.        Cognition and Memory: Memory normal. Cognition is impaired.        Judgment: Judgment is inappropriate.    Review of Systems  Constitutional: Negative.   HENT:  Negative.    Eyes: Negative.   Respiratory: Negative.    Cardiovascular: Negative.   Gastrointestinal: Negative.   Genitourinary: Negative.   Musculoskeletal: Negative.   Skin: Negative.   Neurological: Negative.   Endo/Heme/Allergies: Negative.   Psychiatric/Behavioral:  Positive for depression. The patient has insomnia.    Blood pressure 136/76, pulse 97, temperature 98.4 F (36.9 C), temperature source Oral, resp. rate 18, height '5\' 2"'$  (1.575 m), SpO2 100 %. Body mass index is 20.01 kg/m.   Treatment Plan Summary: Daily contact with patient to assess and evaluate symptoms and progress in treatment, Medication management, and Plan initially increased her Zyprexa to 7.5 and change my mind to 10 mg at bedtime.  Continue Effexo, low-dose Risperdal, and Provigil.  Parks Ranger, DO 06/23/2022, 12:14 PM

## 2022-06-23 NOTE — Progress Notes (Addendum)
1:1 hourly rounding note  7pm: Patient is in the dayroom talking with a visitor. Patient not observed to be in any distress.  8pm: Patient returned to her room and is awake in bed with 1:1 sitter at bedside.  9pm: Patient is awake in bed with 1:1 sitter at bedside. Patient is compliant with scheduled medications.  10pm: Patient is having auditory and visual hallucinations. She is speaking to herself and believes she sees snakes on the wall. 1:1 sitter at bedside.  11pm: Patient is asleep in bed with 1:1 sitter at bedside.  12am: Patient is having auditory and visual hallucinations. Patient is staring at the wall in front of her and appears very anxious. 1:1 sitter at bedside.  1 am: Patient starts to get out of bed and sit on the edge of the bed. She is tearful and says "why did that happen to him, he didn't deserve that". Patient unable to elaborate further on the topic. PRN Ativan given for anxiety. 1:1 sitter at bedside.  2am: Patient asleep in bed with 1:1 sitter at bedside. Patient not observed to be in any distress.  3am: Patient asleep in bed with 1:1 sitter at bedside. Patient not observed to be in any distress.  4am:Patient asleep in bed with 1:1 sitter at bedside. Patient not observed to be in any distress.  5am:Patient asleep in bed with 1:1 sitter at bedside. Patient not observed to be in any distress.  6am:Patient asleep in bed with 1:1 sitter at bedside. Patient not observed to be in any distress.

## 2022-06-23 NOTE — Plan of Care (Signed)
Pt endorses anxiety however, denies depression at this time. Pt denies SI/HI/AVH or pain at this time. Pt is calm and cooperative. Pt is medication compliant. Pt provided with support and encouragement. Pt monitored q15 minutes for safety per unit policy. Plan of care ongoing.   Pt continues to worry about her son. Pt is provided with support and reassurance that he is well. PT came and worked with patient this afternoon.   Problem: Activity: Goal: Risk for activity intolerance will decrease Outcome: Progressing   Problem: Coping: Goal: Level of anxiety will decrease Outcome: Progressing

## 2022-06-24 DIAGNOSIS — F332 Major depressive disorder, recurrent severe without psychotic features: Secondary | ICD-10-CM | POA: Diagnosis not present

## 2022-06-24 NOTE — Progress Notes (Signed)
Patient denies SI, HI, and AVH. Patient is pleasant and cooperative with assessment. She does not appear as anxious as she did yesterday. Patient observed engaging with other patients in the dayroom. She ate snack and an Ensure drink. Patient also encouraged to drink more fluids. Pulse elevated at 114 and BP 99/63. Patient is compliant with scheduled medications. She returned to her room after finishing snack. Patient remains on 1:1 safety sitter due to confusion and high fall risk.

## 2022-06-24 NOTE — Progress Notes (Signed)
1:1 Rounding  0730: Patient is using the bathroom with sitter assist.  0830: Patient is in the dayroom with sitter by her side.  0930: Patient received medications without difficulty.  1030: Patient in the dayroom watching tv. Sitter at side.  1130: Patient eating lunch. Sitter assist.  9381: Patient attends group #1 with sitter.  1330: Patient in dayroom. No distress noted. Sitter at side.  1430: Patient in dayroom interacting with staff and other patients. Sitter at side.  1530: Patient attending group #2. Appears to be enjoying herself. Sitter at side.  1630: Patient consuming dinner. Sister assist.  8299: Patient in the dayroom watching a movie. Sitter at side. No distress noted.  1830: Patient had an anxiety attack and began jumping up and down, facial expression in anguish. Patient says that she is waiting on her son to visit and is upset that he has not shown up. Staff immediately phoned her son. This nurse adm Ativan '1mg'$  PRN PO at [place]. Upon follow up 15 min later, patient is sitting calmly in the dayroom with sitter at her side.  -Patient medicated with PRN MOM(consumed 1/2) at 1000 for complaints of not having had a BM in over 3 days. Upon follow up, sitter reports patient a had smooth BM.

## 2022-06-24 NOTE — Progress Notes (Signed)

## 2022-06-24 NOTE — Progress Notes (Signed)
   06/24/22 1300  Group Notes  Time of group 1200  Type of therapy Psychoeducational skills  Participation level Active  Participation quality Appropriate;Attentive;Sharing  Affect Blunted  Cognitive Appropriate  Modes of intervention Activity;Socialization  Summary of progress / Problems addressed Patients took turns passing the activity ball around and answering various questions that promote the use of cognition, thoughts, and feelings. Patient engaged and participated in group. Able to answer questions appropriately. Patient remained in group for entire session.

## 2022-06-24 NOTE — Group Note (Signed)
[facility] LCSW Group Therapy Note   Group Date: 06/24/2022 Start Time: 7847 End Time: 1515   Type of Therapy/Topic:  Group Therapy:  Emotion Regulation  Participation Level:  Active   Mood:  Description of Group:    The purpose of this group is to assist patients in learning to regulate negative emotions and experience positive emotions. Patients will be guided to discuss ways in which they have been vulnerable to their negative emotions. These vulnerabilities will be juxtaposed with experiences of positive emotions or situations, and patients challenged to use positive emotions to combat negative ones. Special emphasis will be placed on coping with negative emotions in conflict situations, and patients will process healthy conflict resolution skills.  Therapeutic Goals: Patient will identify two positive emotions or experiences to reflect on in order to balance out negative emotions:  Patient will label two or more emotions that they find the most difficult to experience:  Patient will be able to demonstrate positive conflict resolution skills through discussion or role plays:   Summary of Patient Progress:   CSW led group in gratitude activity exploration. Patient was present for the entirety of the group session. Patient was an active listener and participated in the topic of discussion, provided helpful advice to others, and added nuance to topic of conversation.  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     Therapeutic Modalities:   Cognitive Behavioral Therapy Feelings Identification Dialectical Behavioral Therapy   Samani Deal A [place], LCSWA

## 2022-06-24 NOTE — Progress Notes (Signed)
[facility] MD Progress Note  06/24/2022 12:45 PM Cathy [name]  MRN:  [id number] Subjective: Cathy [name] is seen on rounds.  She is more alert and interactive with staff and peers.  Nurses tell me that she slept last night and there were no hallucinations or delusions.  She says that she visited with her son last night and it went well.  She asks about leaving and I told her she is not ready.  I think the UTI played a big role in her sudden decline cognitively.  I think going up on the Zyprexa last night was helpful.  Her mood seems to be improving.  She is alert and oriented x 2.  Her appetite is good.  Physical therapy saw her and recommended that she would benefit from a SNF to improve balance with skilled PT.  They also stated that if she has 24/7 assistance she could go home.  This might change as she continues to cognitively improve along with her mood.  Principal Problem: Major depressive disorder, recurrent episode, severe (HCC) Diagnosis: Principal Problem:   Major depressive disorder, recurrent episode, severe ([place])  Total Time spent with patient: 15 minutes  Past Psychiatric History: She has been on Lexapro and Trintellix prescribed by her PCP.  No past psychiatric hospitalizations and no past suicide attempts.     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   Vision changes     Past Surgical History:  Procedure Laterality Date   CHOLECYSTECTOMY     IR IMAGING  GUIDED PORT INSERTION  09/21/2020   LITHOTRIPSY  1984   LYMPH NODE BIOPSY     MASS BIOPSY Left 09/03/2020   Procedure: LEFT NECK JUGULAR NODE;  Surgeon: Rozetta Nunnery, MD;  Location: [place];  Service: ENT;  Laterality: Left;   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   Procedure: LEFT TOTAL KNEE ARTHROPLASTY;  Surgeon: Gaynelle Arabian, MD;  Location: [facility];  Service: Orthopedics;  Laterality: Left;   Family History:  Family History  Problem Relation Age of Onset   Hypertension Mother    Stroke Mother    Heart attack Mother        CABG, 5 Stents   Dementia Mother    Cancer Father        Kidney   Asthma Sister    Diabetes Brother        Borderline   Alcohol abuse Brother    Cancer Maternal Aunt        stomach ca   Cancer Maternal Aunt        ovarian ca   Breast cancer Cousin    Colon cancer Neg Hx    Family Psychiatric  History: Unremarkable Social History:  Social History   Substance and Sexual Activity  Alcohol Use No     Social History   Substance and Sexual  Activity  Drug Use No    Social History   Socioeconomic History   Marital status: Widowed    Spouse name: Not on file   Number of children: Not on file   Years of education: Not on file   Highest education level: Not on file  Occupational History   Not on file  Tobacco Use   Smoking status: Never   Smokeless tobacco: Never  Substance and Sexual Activity   Alcohol use: No   Drug use: No   Sexual activity: Not Currently    Birth control/protection: Post-menopausal  Other Topics Concern   Not on file  Social History Narrative   Lives alone.     Son is [name].     Social Determinants of Health   Financial Resource Strain: Not on file  Food Insecurity: No Food Insecurity (06/14/2022)   Hunger Vital Sign    Worried About Running Out of Food in the Last Year: Never true    Ran Out of Food in the Last Year: Never true  Transportation Needs: No Transportation Needs  (06/14/2022)   PRAPARE - Hydrologist (Medical): No    Lack of Transportation (Non-Medical): No  Physical Activity: Not on file  Stress: Not on file  Social Connections: Not on file   Additional Social History:                         Sleep: Good  Appetite:  Good  Current Medications: Current Facility-Administered Medications  Medication Dose Route Frequency Provider Last Rate Last Admin   acetaminophen (TYLENOL) tablet 650 mg  650 mg Oral Q6H PRN Parks Ranger, DO   650 mg at 06/20/22 2121   alum & mag hydroxide-simeth (MAALOX/MYLANTA) 200-200-20 MG/5ML suspension 30 mL  30 mL Oral Q4H PRN Parks Ranger, DO       aspirin EC tablet 81 mg  81 mg Oral Daily Parks Ranger, DO   81 mg at 06/24/22 1009   atorvastatin (LIPITOR) tablet 20 mg  20 mg Oral Daily Parks Ranger, DO   20 mg at 06/24/22 1007   cholecalciferol (VITAMIN D3) 25 MCG (1000 UNIT) tablet 1,000 Units  1,000 Units Oral Daily Parks Ranger, DO   1,000 Units at 06/24/22 1007   cyanocobalamin (VITAMIN B12) tablet 1,000 mcg  1,000 mcg Oral Daily Parks Ranger, DO   1,000 mcg at 06/24/22 1009   feeding supplement (ENSURE ENLIVE / ENSURE PLUS) liquid 237 mL  237 mL Oral TID BM Parks Ranger, DO   237 mL at 06/24/22 1010   LORazepam (ATIVAN) tablet 1 mg  1 mg Oral Q4H PRN Parks Ranger, DO   1 mg at 06/23/22 1557   Or   LORazepam (ATIVAN) injection 1 mg  1 mg Intramuscular Q4H PRN Parks Ranger, DO       losartan (COZAAR) tablet 25 mg  25 mg Oral Daily Parks Ranger, DO   25 mg at 06/24/22 1009   magnesium hydroxide (MILK OF MAGNESIA) suspension 30 mL  30 mL Oral Daily PRN Parks Ranger, DO   30 mL at 06/24/22 1006   modafinil (PROVIGIL) tablet 100 mg  100 mg Oral Daily Parks Ranger, DO   100 mg at 06/24/22 1007   multivitamin with minerals tablet 1 tablet  1 tablet Oral Daily  Parks Ranger, DO   1 tablet at 06/24/22  1007   OLANZapine (ZYPREXA) tablet 10 mg  10 mg Oral QHS Parks Ranger, DO   10 mg at 06/23/22 2112   pantoprazole (PROTONIX) EC tablet 40 mg  40 mg Oral Daily Parks Ranger, DO   40 mg at 06/24/22 1007   polyethylene glycol (MIRALAX / GLYCOLAX) packet 17 g  17 g Oral Daily Parks Ranger, DO   17 g at 06/24/22 1004   risperiDONE (RISPERDAL) tablet 0.5 mg  0.5 mg Oral BH-q8a4p Parks Ranger, DO   0.5 mg at 06/24/22 1009   sulfamethoxazole-trimethoprim (BACTRIM DS) 800-160 MG per tablet 1 tablet  1 tablet Oral Q12H Parks Ranger, DO   1 tablet at 06/24/22 1009   traZODone (DESYREL) tablet 50 mg  50 mg Oral QHS PRN Parks Ranger, DO   50 mg at 06/16/22 2122   venlafaxine XR (EFFEXOR-XR) 24 hr capsule 150 mg  150 mg Oral Q breakfast Parks Ranger, DO   150 mg at 06/24/22 1009    Lab Results:  Results for orders placed or performed during the hospital encounter of 06/14/22 (from the past 48 hour(s))  Urinalysis, Routine w reflex microscopic Urine, Clean Catch     Status: Abnormal   Collection Time: 06/23/22  8:24 AM  Result Value Ref Range   Color, Urine YELLOW (A) YELLOW   APPearance CLEAR (A) CLEAR   Specific Gravity, Urine 1.014 1.005 - 1.030   pH 7.0 5.0 - 8.0   Glucose, UA NEGATIVE NEGATIVE mg/dL   Hgb urine dipstick NEGATIVE NEGATIVE   Bilirubin Urine NEGATIVE NEGATIVE   Ketones, ur NEGATIVE NEGATIVE mg/dL   Protein, ur NEGATIVE NEGATIVE mg/dL   Nitrite NEGATIVE NEGATIVE   Leukocytes,Ua TRACE (A) NEGATIVE   RBC / HPF 0-5 0 - 5 RBC/hpf   WBC, UA 0-5 0 - 5 WBC/hpf   Bacteria, UA NONE SEEN NONE SEEN   Squamous Epithelial / HPF 0-5 0 - 5 /HPF    Comment: Performed at [facility], [address] [place], [place] [address]  CBC with Differential/Platelet     Status: None   Collection Time: 06/23/22  8:57 AM  Result Value Ref Range   WBC 9.1 4.0 - 10.5 K/uL    RBC 4.71 3.87 - 5.11 MIL/uL   Hemoglobin 13.8 12.0 - 15.0 g/dL   HCT 42.8 36.0 - 46.0 %   MCV 90.9 80.0 - 100.0 fL   MCH 29.3 26.0 - 34.0 pg   MCHC 32.2 30.0 - 36.0 g/dL   RDW 13.3 11.5 - 15.5 %   Platelets 221 150 - 400 K/uL   nRBC 0.0 0.0 - 0.2 %   Neutrophils Relative % 78 %   Neutro Abs 7.1 1.7 - 7.7 K/uL   Lymphocytes Relative 12 %   Lymphs Abs 1.1 0.7 - 4.0 K/uL   Monocytes Relative 9 %   Monocytes Absolute 0.8 0.1 - 1.0 K/uL   Eosinophils Relative 1 %   Eosinophils Absolute 0.1 0.0 - 0.5 K/uL   Basophils Relative 0 %   Basophils Absolute 0.0 0.0 - 0.1 K/uL   Immature Granulocytes 0 %   Abs Immature Granulocytes 0.03 0.00 - 0.07 K/uL    Comment: Performed at [facility], [address] [place],  [address]  Comprehensive metabolic panel     Status: Abnormal   Collection Time: 06/23/22  8:57 AM  Result Value Ref Range   Sodium 137 135 - 145 mmol/L   Potassium  4.1 3.5 - 5.1 mmol/L   Chloride 103 98 - 111 mmol/L   CO2 24 22 - 32 mmol/L   Glucose, Bld 144 (H) 70 - 99 mg/dL    Comment: Glucose reference range applies only to samples taken after fasting for at least 8 hours.   BUN 22 8 - 23 mg/dL   Creatinine, Ser 0.90 0.44 - 1.00 mg/dL   Calcium 9.0 8.9 - 10.3 mg/dL   Total Protein 6.9 6.5 - 8.1 g/dL   Albumin 4.0 3.5 - 5.0 g/dL   AST 29 15 - 41 U/L   ALT 20 0 - 44 U/L   Alkaline Phosphatase 48 38 - 126 U/L   Total Bilirubin 0.5 0.3 - 1.2 mg/dL   GFR, Estimated >60 >60 mL/min    Comment: (NOTE) Calculated using the CKD-EPI Creatinine Equation (2021)    Anion gap 10 5 - 15    Comment: Performed at [facility], [address] [place], [place] [address]    Blood Alcohol level:  Lab Results  Component Value Date   [facility] <10 99/37/1696    Metabolic Disorder Labs: Lab Results  Component Value Date   HGBA1C 5.5 06/10/2022   MPG 111.15 06/10/2022   MPG 105 04/05/2021   No results found for: "PROLACTIN" Lab Results  Component Value  Date   CHOL 246 (H) 06/10/2022   TRIG 94 06/10/2022   HDL 57 06/10/2022   CHOLHDL 4.3 06/10/2022   VLDL 19 06/10/2022   LDLCALC 170 (H) 06/10/2022   LDLCALC 67 04/05/2021    Physical Findings: AIMS:  , ,  ,  ,    CIWA:    COWS:     Musculoskeletal: Strength & Muscle Tone: within normal limits Gait & Station: unsteady Patient leans: N/A  Psychiatric Specialty Exam:  Presentation  General Appearance:  Appropriate for Environment; Casual  Eye Contact: Good  Speech: Clear and Coherent; Normal Rate  Speech Volume: Normal  Handedness: Right   Mood and Affect  Mood: Dysphoric  Affect: Congruent   Thought Process  Thought Processes: Coherent  Descriptions of Associations:Intact  Orientation:Full (Time, Place and Person)  Thought Content:Logical  History of Schizophrenia/Schizoaffective disorder:No  Duration of Psychotic Symptoms:No data recorded Hallucinations:No data recorded Ideas of Reference:None  Suicidal Thoughts:No data recorded Homicidal Thoughts:No data recorded  Sensorium  Memory: Immediate Good; Recent Good; Remote Good  Judgment: Good  Insight: Good   Executive Functions  Concentration: Good  Attention Span: Good  Recall: Good  Fund of Knowledge: Good  Language: Good   Psychomotor Activity  Psychomotor Activity:No data recorded  Assets  Assets: Physical Health; Resilience; Social Support; Catering manager; Housing   Sleep  Sleep:No data recorded   Physical Exam: Physical Exam Vitals and nursing note reviewed.  Constitutional:      Appearance: Normal appearance. She is normal weight.  Neurological:     General: No focal deficit present.     Mental Status: She is alert.  Psychiatric:        Attention and Perception: Attention and perception normal.        Mood and Affect: Affect normal. Mood is depressed.        Speech: Speech normal.        Behavior: Behavior normal. Behavior is  cooperative.        Thought Content: Thought content normal.        Cognition and Memory: Cognition is impaired. Memory is impaired.        Judgment: Judgment  normal.    Review of Systems  Constitutional: Negative.   HENT: Negative.    Eyes: Negative.   Respiratory: Negative.    Cardiovascular: Negative.   Gastrointestinal: Negative.   Genitourinary: Negative.   Musculoskeletal: Negative.   Skin: Negative.   Neurological: Negative.   Endo/Heme/Allergies: Negative.   Psychiatric/Behavioral:  Positive for depression.    Blood pressure 134/76, pulse (!) 103, temperature 97.9 F (36.6 C), temperature source Oral, resp. rate 16, height '5\' 2"'$  (1.575 m), SpO2 100 %. Body mass index is 20.01 kg/m.   Treatment Plan Summary: Daily contact with patient to assess and evaluate symptoms and progress in treatment, Medication management, and Plan continue current medications.  Parks Ranger, DO 06/24/2022, 12:45 PM

## 2022-06-24 NOTE — Progress Notes (Signed)
1:1 hourly rounding  7pm: Patient is in the dayroom with a visitor. She is calm and composed with sitter at her side.  8pm: Patient is in the dayroom interacting with other patients. She is calm and composed with sitter at her side.  9pm: Patient is in the dayroom interacting with other patients. She is calm and composed with sitter at her side.  10pm: Patient is in bed asleep with sitter at her side. Patient not observed to be in distress.  11pm: Patient is in bed asleep with sitter at her side. Patient not observed to be in distress.  12 am: Patient is in bed asleep with sitter at her side. Patient not observed to be in distress.  1am: Patient is in bed asleep with sitter at her side. Patient not observed to be in distress.  2am: Patient is in bed asleep with sitter at her side. Patient not observed to be in distress.  3am: Patient is in bed asleep with sitter at her side. Patient not observed to be in distress.  4am:Patient is in bed asleep with sitter at her side. Patient not observed to be in distress.  5am: Patient is in bed asleep with sitter at her side. Patient not observed to be in distress.  6am:  Patient is in bed asleep with sitter at her side. Patient not observed to be in distress.

## 2022-06-24 NOTE — Plan of Care (Signed)
  Problem: Nutrition: Goal: Adequate nutrition will be maintained Outcome: Progressing   Problem: Elimination: Goal: Will not experience complications related to bowel motility Outcome: Progressing Goal: Will not experience complications related to urinary retention Outcome: Progressing   

## 2022-06-25 DIAGNOSIS — F332 Major depressive disorder, recurrent severe without psychotic features: Secondary | ICD-10-CM | POA: Diagnosis not present

## 2022-06-25 NOTE — Progress Notes (Signed)
[facility] MD Progress Note  06/25/2022 11:47 AM Cathy [name]  MRN:  [id number] Subjective: Cathy [name] is seen on rounds.  She is alert and oriented x 2.  She is pleasant cooperative and compliant with her medication.  Her appetite is improving.  She denies any problems.  Nurses report no issues.  She slept well and no hallucinations reported. Principal Problem: Major depressive disorder, recurrent episode, severe (HCC) Diagnosis: Principal Problem:   Major depressive disorder, recurrent episode, severe ([place])  Total Time spent with patient: 15 minutes  Past Psychiatric History:  She has been on Lexapro and Trintellix prescribed by her PCP.  No past psychiatric hospitalizations and no past suicide attempts.      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   Vision changes     Past Surgical History:  Procedure Laterality Date   CHOLECYSTECTOMY     IR IMAGING GUIDED PORT INSERTION  09/21/2020   LITHOTRIPSY  1984   LYMPH NODE BIOPSY     MASS BIOPSY Left 09/03/2020   Procedure: LEFT NECK JUGULAR NODE;  Surgeon: Rozetta Nunnery, MD;  Location: [place];  Service: ENT;  Laterality: Left;   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   Procedure: LEFT TOTAL KNEE ARTHROPLASTY;  Surgeon: Gaynelle Arabian, MD;  Location: [facility];  Service: Orthopedics;  Laterality: Left;   Family History:  Family History  Problem Relation Age of Onset   Hypertension Mother     Stroke Mother    Heart attack Mother        CABG, 5 Stents   Dementia Mother    Cancer Father        Kidney   Asthma Sister    Diabetes Brother        Borderline   Alcohol abuse Brother    Cancer Maternal Aunt        stomach ca   Cancer Maternal Aunt        ovarian ca   Breast cancer Cousin    Colon cancer Neg Hx     Social History:  Social History   Substance and Sexual Activity  Alcohol Use No     Social History   Substance and Sexual Activity  Drug Use No    Social History   Socioeconomic History   Marital status: Widowed    Spouse name: Not on file   Number of children: Not on file   Years of education: Not on file   Highest education level: Not on file  Occupational History   Not on file  Tobacco Use   Smoking status: Never   Smokeless tobacco: Never  Substance and Sexual Activity   Alcohol use: No   Drug use: No   Sexual activity: Not Currently    Birth control/protection: Post-menopausal  Other Topics Concern   Not on file  Social History  Narrative   Lives alone.     Son is [name].     Social Determinants of Health   Financial Resource Strain: Not on file  Food Insecurity: No Food Insecurity (06/14/2022)   Hunger Vital Sign    Worried About Running Out of Food in the Last Year: Never true    Ran Out of Food in the Last Year: Never true  Transportation Needs: No Transportation Needs (06/14/2022)   PRAPARE - Hydrologist (Medical): No    Lack of Transportation (Non-Medical): No  Physical Activity: Not on file  Stress: Not on file  Social Connections: Not on file   Additional Social History:                         Sleep: Good  Appetite:  Good  Current Medications: Current Facility-Administered Medications  Medication Dose Route Frequency Provider Last Rate Last Admin   acetaminophen (TYLENOL) tablet 650 mg  650 mg Oral Q6H PRN Parks Ranger, DO   650 mg at 06/20/22 2121   alum &  mag hydroxide-simeth (MAALOX/MYLANTA) 200-200-20 MG/5ML suspension 30 mL  30 mL Oral Q4H PRN Parks Ranger, DO       aspirin EC tablet 81 mg  81 mg Oral Daily Parks Ranger, DO   81 mg at 06/25/22 0910   atorvastatin (LIPITOR) tablet 20 mg  20 mg Oral Daily Parks Ranger, DO   20 mg at 06/25/22 0910   cholecalciferol (VITAMIN D3) 25 MCG (1000 UNIT) tablet 1,000 Units  1,000 Units Oral Daily Parks Ranger, DO   1,000 Units at 06/25/22 9678   cyanocobalamin (VITAMIN B12) tablet 1,000 mcg  1,000 mcg Oral Daily Parks Ranger, DO   1,000 mcg at 06/25/22 0910   feeding supplement (ENSURE ENLIVE / ENSURE PLUS) liquid 237 mL  237 mL Oral TID BM Parks Ranger, DO   237 mL at 06/25/22 0933   LORazepam (ATIVAN) tablet 1 mg  1 mg Oral Q4H PRN Parks Ranger, DO   1 mg at 06/24/22 9381   Or   LORazepam (ATIVAN) injection 1 mg  1 mg Intramuscular Q4H PRN Parks Ranger, DO       losartan (COZAAR) tablet 25 mg  25 mg Oral Daily Parks Ranger, DO   25 mg at 06/25/22 0910   magnesium hydroxide (MILK OF MAGNESIA) suspension 30 mL  30 mL Oral Daily PRN Parks Ranger, DO   30 mL at 06/24/22 1006   modafinil (PROVIGIL) tablet 100 mg  100 mg Oral Daily Parks Ranger, DO   100 mg at 06/25/22 0175   multivitamin with minerals tablet 1 tablet  1 tablet Oral Daily Parks Ranger, DO   1 tablet at 06/25/22 0910   OLANZapine (ZYPREXA) tablet 10 mg  10 mg Oral QHS Parks Ranger, DO   10 mg at 06/24/22 2113   pantoprazole (PROTONIX) EC tablet 40 mg  40 mg Oral Daily Parks Ranger, DO   40 mg at 06/25/22 0910   polyethylene glycol (MIRALAX / GLYCOLAX) packet 17 g  17 g Oral Daily Parks Ranger, DO   17 g at 06/25/22 0908   risperiDONE (RISPERDAL) tablet 0.5 mg  0.5 mg Oral BH-q8a4p Parks Ranger, DO   0.5 mg at 06/25/22 0911   sulfamethoxazole-trimethoprim (BACTRIM DS)  800-160 MG per tablet 1 tablet  1 tablet  Oral Q12H Parks Ranger, DO   1 tablet at 06/25/22 8341   traZODone (DESYREL) tablet 50 mg  50 mg Oral QHS PRN Parks Ranger, DO   50 mg at 06/16/22 2122   venlafaxine XR (EFFEXOR-XR) 24 hr capsule 150 mg  150 mg Oral Q breakfast Parks Ranger, DO   150 mg at 06/25/22 9622    Lab Results: No results found for this or any previous visit (from the past 48 hour(s)).  Blood Alcohol level:  Lab Results  Component Value Date   ETH <10 29/79/8921    Metabolic Disorder Labs: Lab Results  Component Value Date   HGBA1C 5.5 06/10/2022   MPG 111.15 06/10/2022   MPG 105 04/05/2021   No results found for: "PROLACTIN" Lab Results  Component Value Date   CHOL 246 (H) 06/10/2022   TRIG 94 06/10/2022   HDL 57 06/10/2022   CHOLHDL 4.3 06/10/2022   VLDL 19 06/10/2022   LDLCALC 170 (H) 06/10/2022   LDLCALC 67 04/05/2021    Physical Findings: AIMS:  , ,  ,  ,    CIWA:    COWS:     Musculoskeletal: Strength & Muscle Tone: within normal limits Gait & Station: unsteady Patient leans: N/A  Psychiatric Specialty Exam:  Presentation  General Appearance:  Appropriate for Environment; Casual  Eye Contact: Good  Speech: Clear and Coherent; Normal Rate  Speech Volume: Normal  Handedness: Right   Mood and Affect  Mood: Dysphoric  Affect: Congruent   Thought Process  Thought Processes: Coherent  Descriptions of Associations:Intact  Orientation:Full (Time, Place and Person)  Thought Content:Logical  History of Schizophrenia/Schizoaffective disorder:No  Duration of Psychotic Symptoms:No data recorded Hallucinations:No data recorded Ideas of Reference:None  Suicidal Thoughts:No data recorded Homicidal Thoughts:No data recorded  Sensorium  Memory: Immediate Good; Recent Good; Remote Good  Judgment: Good  Insight: Good   Executive Functions  Concentration: Good  Attention  Span: Good  Recall: Good  Fund of Knowledge: Good  Language: Good   Psychomotor Activity  Psychomotor Activity:No data recorded  Assets  Assets: Physical Health; Resilience; Social Support; Catering manager; Housing   Sleep  Sleep:No data recorded    Blood pressure 124/72, pulse 97, temperature 98.4 F (36.9 C), temperature source Oral, resp. rate 16, height '5\' 2"'$  (1.575 m), SpO2 100 %. Body mass index is 20.01 kg/m.   Treatment Plan Summary: Daily contact with patient to assess and evaluate symptoms and progress in treatment, Medication management, and Plan continue current medications.  Parks Ranger, DO 06/25/2022, 11:47 AM

## 2022-06-25 NOTE — BH IP Treatment Plan (Signed)
Interdisciplinary Treatment and Diagnostic Plan Update  06/25/2022 Time of Session: 0830 Cathy [name] MRN: [id number]  Principal Diagnosis: Major depressive disorder, recurrent episode, severe ([place])  Secondary Diagnoses: Principal Problem:   Major depressive disorder, recurrent episode, severe ([place])   Current Medications:  Current Facility-Administered Medications  Medication Dose Route Frequency Provider Last Rate Last Admin   acetaminophen (TYLENOL) tablet 650 mg  650 mg Oral Q6H PRN Parks Ranger, DO   650 mg at 06/20/22 2121   alum & mag hydroxide-simeth (MAALOX/MYLANTA) 200-200-20 MG/5ML suspension 30 mL  30 mL Oral Q4H PRN Parks Ranger, DO       aspirin EC tablet 81 mg  81 mg Oral Daily Parks Ranger, DO   81 mg at 06/25/22 0910   atorvastatin (LIPITOR) tablet 20 mg  20 mg Oral Daily Parks Ranger, DO   20 mg at 06/25/22 0910   cholecalciferol (VITAMIN D3) 25 MCG (1000 UNIT) tablet 1,000 Units  1,000 Units Oral Daily Parks Ranger, DO   1,000 Units at 06/25/22 2536   cyanocobalamin (VITAMIN B12) tablet 1,000 mcg  1,000 mcg Oral Daily Parks Ranger, DO   1,000 mcg at 06/25/22 0910   feeding supplement (ENSURE ENLIVE / ENSURE PLUS) liquid 237 mL  237 mL Oral TID BM Parks Ranger, DO   237 mL at 06/25/22 0933   LORazepam (ATIVAN) tablet 1 mg  1 mg Oral Q4H PRN Parks Ranger, DO   1 mg at 06/24/22 6440   Or   LORazepam (ATIVAN) injection 1 mg  1 mg Intramuscular Q4H PRN Parks Ranger, DO       losartan (COZAAR) tablet 25 mg  25 mg Oral Daily Parks Ranger, DO   25 mg at 06/25/22 0910   magnesium hydroxide (MILK OF MAGNESIA) suspension 30 mL  30 mL Oral Daily PRN Parks Ranger, DO   30 mL at 06/24/22 1006   modafinil (PROVIGIL) tablet 100 mg  100 mg Oral Daily Parks Ranger, DO   100 mg at 06/25/22 3474   multivitamin with minerals tablet 1 tablet  1 tablet Oral  Daily Parks Ranger, DO   1 tablet at 06/25/22 0910   OLANZapine (ZYPREXA) tablet 10 mg  10 mg Oral QHS Parks Ranger, DO   10 mg at 06/24/22 2113   pantoprazole (PROTONIX) EC tablet 40 mg  40 mg Oral Daily Parks Ranger, DO   40 mg at 06/25/22 0910   polyethylene glycol (MIRALAX / GLYCOLAX) packet 17 g  17 g Oral Daily Parks Ranger, DO   17 g at 06/25/22 0908   risperiDONE (RISPERDAL) tablet 0.5 mg  0.5 mg Oral [id number] Parks Ranger, DO   0.5 mg at 06/25/22 0911   sulfamethoxazole-trimethoprim (BACTRIM DS) 800-160 MG per tablet 1 tablet  1 tablet Oral Q12H Parks Ranger, DO   1 tablet at 06/25/22 0910   traZODone (DESYREL) tablet 50 mg  50 mg Oral QHS PRN Parks Ranger, DO   50 mg at 06/16/22 2122   venlafaxine XR (EFFEXOR-XR) 24 hr capsule 150 mg  150 mg Oral Q breakfast Parks Ranger, DO   150 mg at 06/25/22 2595   PTA Medications: Medications Prior to Admission  Medication Sig Dispense Refill Last Dose   albuterol (VENTOLIN HFA) 108 (90 Base) MCG/ACT inhaler Inhale 2 puffs into the lungs every 6 (six) hours as needed for wheezing or shortness of breath. 8  g 2    aspirin EC 81 MG tablet Take 81 mg by mouth daily.      atorvastatin (LIPITOR) 20 MG tablet Take 1 tablet (20 mg total) by mouth daily. 90 tablet 1    cetirizine (ZYRTEC) 10 MG tablet Take 1 tablet (10 mg total) by mouth daily. 90 tablet 1    cholecalciferol (CHOLECALCIFEROL) 25 MCG tablet Take 1 tablet (1,000 Units total) by mouth daily.      cyanocobalamin (VITAMIN B12) 1000 MCG tablet Take 1 tablet (1,000 mcg total) by mouth daily.      denosumab (PROLIA) 60 MG/ML SOSY injection Inject 60 mg into the skin every 6 (six) months.      estradiol (ESTRACE) 0.1 MG/GM vaginal cream Discard plastic applicator. Insert blueberry size amount of cream on finger in vagina daily x1 week then 2x per week.      fluticasone furoate-vilanterol (BREO ELLIPTA) 100-25  MCG/ACT AEPB Inhale 1 puff into the lungs daily. (Patient taking differently: Inhale 1 puff into the lungs daily as needed.) 60 each 5    glucose blood (ACCU-CHEK AVIVA PLUS) test strip Use to Monitor blood sugars daily. DX. E11.9 100 each 5    hydrochlorothiazide (MICROZIDE) 12.5 MG capsule Take 1 capsule (12.5 mg total) by mouth daily. 90 capsule 1    hydroxypropyl methylcellulose / hypromellose (ISOPTO TEARS / GONIOVISC) 2.5 % ophthalmic solution Place 2 drops into both eyes 3 (three) times daily as needed for dry eyes.      lidocaine-prilocaine (EMLA) cream Apply to affected area once (Patient taking differently: 1 Application as needed. Apply to affected area to port as needed) 30 g 3    losartan (COZAAR) 100 MG tablet TAKE 1 TABLET BY MOUTH ONCE A DAY 90 tablet 1    metoprolol succinate (TOPROL-XL) 50 MG 24 hr tablet Take 1 tablet (50 mg total) by mouth daily. Take with or immediately following a meal. 90 tablet 1    Multiple Vitamin (MULTIVITAMIN) capsule Take 1 capsule by mouth daily.      omeprazole (PRILOSEC) 20 MG capsule Take 1 capsule (20 mg total) by mouth daily. 90 capsule 1    venlafaxine XR (EFFEXOR XR) 75 MG 24 hr capsule Take 2 capsules (150 mg total) by mouth daily with breakfast. (Patient taking differently: Take 75 mg by mouth daily with breakfast.)       Patient Stressors:    Patient Strengths:    Treatment Modalities: Medication Management, Group therapy, Case management,  1 to 1 session with clinician, Psychoeducation, Recreational therapy.   Physician Treatment Plan for Primary Diagnosis: Major depressive disorder, recurrent episode, severe ([place]) Long Term Goal(s): Improvement in symptoms so as ready for discharge   Short Term Goals: Ability to identify changes in lifestyle to reduce recurrence of condition will improve Ability to verbalize feelings will improve Ability to disclose and discuss suicidal ideas Ability to demonstrate self-control will improve Ability  to identify and develop effective coping behaviors will improve Ability to maintain clinical measurements within normal limits will improve Compliance with prescribed medications will improve Ability to identify triggers associated with substance abuse/mental health issues will improve  Medication Management: Evaluate patient's response, side effects, and tolerance of medication regimen.  Therapeutic Interventions: 1 to 1 sessions, Unit Group sessions and Medication administration.  Evaluation of Outcomes: Progressing  Physician Treatment Plan for Secondary Diagnosis: Principal Problem:   Major depressive disorder, recurrent episode, severe ([place])  Long Term Goal(s): Improvement in symptoms so as ready for discharge  Short Term Goals: Ability to identify changes in lifestyle to reduce recurrence of condition will improve Ability to verbalize feelings will improve Ability to disclose and discuss suicidal ideas Ability to demonstrate self-control will improve Ability to identify and develop effective coping behaviors will improve Ability to maintain clinical measurements within normal limits will improve Compliance with prescribed medications will improve Ability to identify triggers associated with substance abuse/mental health issues will improve     Medication Management: Evaluate patient's response, side effects, and tolerance of medication regimen.  Therapeutic Interventions: 1 to 1 sessions, Unit Group sessions and Medication administration.  Evaluation of Outcomes: Progressing   RN Treatment Plan for Primary Diagnosis: Major depressive disorder, recurrent episode, severe ([place]) Long Term Goal(s): Knowledge of disease and therapeutic regimen to maintain health will improve  Short Term Goals: Ability to remain free from injury will improve, Ability to verbalize frustration and anger appropriately will improve, Ability to demonstrate self-control, Ability to participate in  decision making will improve, Ability to verbalize feelings will improve, Ability to disclose and discuss suicidal ideas, Ability to identify and develop effective coping behaviors will improve, and Compliance with prescribed medications will improve  Medication Management: RN will administer medications as ordered by provider, will assess and evaluate patient's response and provide education to patient for prescribed medication. RN will report any adverse and/or side effects to prescribing provider.  Therapeutic Interventions: 1 on 1 counseling sessions, Psychoeducation, Medication administration, Evaluate responses to treatment, Monitor vital signs and CBGs as ordered, Perform/monitor CIWA, COWS, AIMS and Fall Risk screenings as ordered, Perform wound care treatments as ordered.  Evaluation of Outcomes: Progressing   LCSW Treatment Plan for Primary Diagnosis: Major depressive disorder, recurrent episode, severe ([place]) Long Term Goal(s): Safe transition to appropriate next level of care at discharge, Engage patient in therapeutic group addressing interpersonal concerns.  Short Term Goals: Engage patient in aftercare planning with referrals and resources, Increase social support, Increase ability to appropriately verbalize feelings, Increase emotional regulation, Facilitate acceptance of mental health diagnosis and concerns, Facilitate patient progression through stages of change regarding substance use diagnoses and concerns, Identify triggers associated with mental health/substance abuse issues, and Increase skills for wellness and recovery  Therapeutic Interventions: Assess for all discharge needs, 1 to 1 time with Social worker, Explore available resources and support systems, Assess for adequacy in community support network, Educate family and significant other(s) on suicide prevention, Complete Psychosocial Assessment, Interpersonal group therapy.  Evaluation of Outcomes:  Progressing   Progress in Treatment: Attending groups: Yes. Participating in groups: Yes. Taking medication as prescribed: Yes. Toleration medication: Yes. Family/Significant other contact made: Yes, individual(s) contacted:   Cathy [name], son, [phone] Patient understands diagnosis: Yes. Discussing patient identified problems/goals with staff: Yes. Medical problems stabilized or resolved: Yes. Denies suicidal/homicidal ideation: No. Issues/concerns per patient self-inventory: Yes. Other: none  New problem(s) identified: No, Describe:  none  New Short Term/Long Term Goal(s): Patient to work towards medication management for mood stabilization; elimination of SI thoughts; development of comprehensive mental wellness plan.  Patient Goals: No additional goals identified at this time. Patient to continue to work towards original goals identified in initial treatment team meeting. CSW will remain available to patient should they voice additional treatment goals.   Discharge Plan or Barriers: No psychosocial barriers identified at this time, patient to return to place of residence when appropriate for discharge.   Reason for Continuation of Hospitalization: Depression Medication stabilization  Estimated Length of Stay: 1-7 days   Scribe for Treatment  Team: Durenda Hurt, LCSWA 06/25/2022 11:13 AM

## 2022-06-25 NOTE — Plan of Care (Signed)
  Problem: Safety: Goal: Ability to remain free from injury will improve Outcome: Progressing   Problem: Skin Integrity: Goal: Risk for impaired skin integrity will decrease Outcome: Progressing   Problem: Coping: Goal: Level of anxiety will decrease Outcome: Not Progressing

## 2022-06-25 NOTE — Progress Notes (Signed)
1:1 Rounding   0730: Patient is using the bathroom with sitter assist.   0830: Patient is in the dayroom with sitter by her side.   0930: Patient received medications without difficulty.   1030: Patient in the dayroom watching tv. Sitter at side.   1130: Patient eating lunch. Sitter assist.   5681: Patient attends group #1 with sitter.   1330: Patient in dayroom having a panic attack and unable to self calm. PRN Ativan '1mg'$  PO adm for anxiety.  1430: Upon follow up of the Ativan, it appears to have been effective. Sitter reports a normal sized BM.   1530: Patient is playing a card game with another patient. Sitter at side.  1630: Patient is consuming dinner. Sitter at side. Medication compliance.  1730: PRN Ativan '1mg'$  PO adm for anxiety.  1800: Upon follow up, the Ativan was semi-effective.   1830: Patient has a BM in toilet with sitter assist. Clothes changed into something more comfortable.

## 2022-06-26 DIAGNOSIS — F332 Major depressive disorder, recurrent severe without psychotic features: Secondary | ICD-10-CM | POA: Diagnosis not present

## 2022-06-26 MED ORDER — CLONAZEPAM 0.5 MG PO TABS
0.5000 mg | ORAL_TABLET | Freq: Two times a day (BID) | ORAL | Status: DC
  Administered 2022-06-26 – 2022-06-28 (×4): 0.5 mg via ORAL
  Filled 2022-06-26 (×4): qty 1

## 2022-06-26 MED ORDER — LORAZEPAM 1 MG PO TABS
1.0000 mg | ORAL_TABLET | Freq: Four times a day (QID) | ORAL | Status: DC | PRN
  Administered 2022-06-27 (×4): 1 mg via ORAL
  Filled 2022-06-26 (×4): qty 1

## 2022-06-26 MED ORDER — VENLAFAXINE HCL ER 75 MG PO CP24
225.0000 mg | ORAL_CAPSULE | Freq: Every day | ORAL | Status: DC
  Administered 2022-06-27 – 2022-07-08 (×12): 225 mg via ORAL
  Filled 2022-06-26 (×12): qty 3

## 2022-06-26 NOTE — Progress Notes (Signed)
1:1 Rounding  0730: Patient is asleep. Sitter at side. Vitals signs and assessment conducted. Patient did not stay up to consume breakfast.  0830: Patient is asleep. Sitter at side.  0930: Patient is asleep. Sitter at side.  1030: Patient is asleep. Sitter at side.  1130: Patient is asleep. Sitter at side. She did not wake up for lunch. Unable to be aroused. Respirations even/unlabored.  1230: Patient is asleep. Sitter at side.  1330: Patient is asleep. Sitter at side.  1430: Patient is asleep. Sitter at side.  1530: Patient is asleep. Sitter at side. No distress noted.  1630: Patient is asleep. Sitter at side. Patient did not wake up for dinner. Unable to be aroused.  1730: Patient is asleep. Sitter at side.  1745: Patient awoke and asked to eat. Sitter at side.  1830 - Vitals conducted and medication scheduled for daily were adm at this time. BP slightly elevated as this may be due to not receiving AM meds this morning. BP will be rechecked at approx 1930.

## 2022-06-26 NOTE — Progress Notes (Signed)
Patient is alert and oriented times 2. Mood and affect are irritated. Patient was hard to redirect at times, especially after her son did not come and visit today, instead her granddaughter came.  Patient was provided reassurances and given emotional support.  Patient appeared to improve after a little bit of time after the granddaughter left. Patient rates pain as 0/10. She denies SI, HI, and AVH. Patient does endorse feelings of anxiety and depression at this time. Patient states she slept well last night. Evening medicines administered whole by mouth without difficulty. Patient ate snack in day room; appetite was fair. Patient remains on unit with 1:1 sitter of safety and Q15 minute checks in place.

## 2022-06-26 NOTE — Progress Notes (Signed)
[facility] MD Progress Note  06/26/2022 11:21 AM Cathy [name]  MRN:  [id number] Subjective: Cathy [name] is seen on rounds.  She is sleeping well and there is no evidence of any hallucinations last few days.  She has been receiving 1 mg of Ativan about 3 times a day for anxiety.  Nurses state that she is very anxious and gets very agitated prior to family visiting in the evening.  Number to go ahead and start her on a long-acting benzo since she is already taking it which will be Klonopin 0.5 twice a day.  We will get rid of the Risperdal and increase her Effexor since she is still complaining of a lot of depression. Principal Problem: Major depressive disorder, recurrent episode, severe ([facility]) Diagnosis: Principal Problem:   Major depressive disorder, recurrent episode, severe ([place])  Total Time spent with patient: 15 minutes  Past Psychiatric History:  She has been on Lexapro and Trintellix prescribed by her PCP.  No past psychiatric hospitalizations and no past suicide attempts.     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   Vision changes     Past Surgical History:  Procedure Laterality Date   CHOLECYSTECTOMY     IR IMAGING GUIDED PORT INSERTION  09/21/2020   LITHOTRIPSY  1984   LYMPH NODE BIOPSY     MASS BIOPSY Left 09/03/2020   Procedure: LEFT NECK JUGULAR NODE;  Surgeon: Rozetta Nunnery, MD;  Location: [facility];  Service: ENT;  Laterality:  Left;   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   Procedure: LEFT TOTAL KNEE ARTHROPLASTY;  Surgeon: Gaynelle Arabian, MD;  Location: [facility];  Service: Orthopedics;  Laterality: Left;   Family History:  Family History  Problem Relation Age of Onset   Hypertension Mother    Stroke Mother    Heart attack Mother        CABG, 5 Stents   Dementia Mother    Cancer Father        Kidney   Asthma Sister    Diabetes Brother        Borderline   Alcohol abuse Brother    Cancer Maternal Aunt        stomach ca   Cancer Maternal Aunt        ovarian ca   Breast cancer Cousin    Colon cancer Neg Hx     Social History:  Social History   Substance and Sexual Activity  Alcohol Use No     Social History   Substance and Sexual Activity  Drug Use No    Social History   Socioeconomic History   Marital status: Widowed    Spouse name: Not on file   Number of children: Not on file   Years of education: Not on file   Highest education level: Not on file  Occupational History  Not on file  Tobacco Use   Smoking status: Never   Smokeless tobacco: Never  Substance and Sexual Activity   Alcohol use: No   Drug use: No   Sexual activity: Not Currently    Birth control/protection: Post-menopausal  Other Topics Concern   Not on file  Social History Narrative   Lives alone.     Son is [name].     Social Determinants of Health   Financial Resource Strain: Not on file  Food Insecurity: No Food Insecurity (06/14/2022)   Hunger Vital Sign    Worried About Running Out of Food in the Last Year: Never true    Ran Out of Food in the Last Year: Never true  Transportation Needs: No Transportation Needs (06/14/2022)   PRAPARE - Hydrologist (Medical): No    Lack of Transportation (Non-Medical): No  Physical Activity: Not on file  Stress: Not on file  Social Connections: Not on file   Additional Social History:                         Sleep:  Good  Appetite:  Fair  Current Medications: Current Facility-Administered Medications  Medication Dose Route Frequency Provider Last Rate Last Admin   acetaminophen (TYLENOL) tablet 650 mg  650 mg Oral Q6H PRN Parks Ranger, DO   650 mg at 06/20/22 2121   alum & mag hydroxide-simeth (MAALOX/MYLANTA) 200-200-20 MG/5ML suspension 30 mL  30 mL Oral Q4H PRN Parks Ranger, DO       aspirin EC tablet 81 mg  81 mg Oral Daily Parks Ranger, DO   81 mg at 06/25/22 0910   atorvastatin (LIPITOR) tablet 20 mg  20 mg Oral Daily Parks Ranger, DO   20 mg at 06/25/22 4081   cholecalciferol (VITAMIN D3) 25 MCG (1000 UNIT) tablet 1,000 Units  1,000 Units Oral Daily Parks Ranger, DO   1,000 Units at 06/25/22 0911   clonazePAM (KLONOPIN) tablet 0.5 mg  0.5 mg Oral BID Parks Ranger, DO       cyanocobalamin (VITAMIN B12) tablet 1,000 mcg  1,000 mcg Oral Daily Parks Ranger, DO   1,000 mcg at 06/25/22 0910   feeding supplement (ENSURE ENLIVE / ENSURE PLUS) liquid 237 mL  237 mL Oral TID BM Parks Ranger, DO   237 mL at 06/25/22 2013   LORazepam (ATIVAN) tablet 1 mg  1 mg Oral Q6H PRN Parks Ranger, DO       losartan (COZAAR) tablet 25 mg  25 mg Oral Daily Parks Ranger, DO   25 mg at 06/25/22 0910   magnesium hydroxide (MILK OF MAGNESIA) suspension 30 mL  30 mL Oral Daily PRN Parks Ranger, DO   30 mL at 06/24/22 1006   modafinil (PROVIGIL) tablet 100 mg  100 mg Oral Daily Parks Ranger, DO   100 mg at 06/25/22 0910   multivitamin with minerals tablet 1 tablet  1 tablet Oral Daily Parks Ranger, DO   1 tablet at 06/25/22 0910   OLANZapine (ZYPREXA) tablet 10 mg  10 mg Oral QHS Parks Ranger, DO   10 mg at 06/25/22 2132   pantoprazole (PROTONIX) EC tablet 40 mg  40 mg Oral Daily Parks Ranger, DO   40 mg at 06/25/22 0910   polyethylene glycol (MIRALAX / GLYCOLAX)  packet 17 g  17 g Oral Daily  Parks Ranger, DO   17 g at 06/25/22 0908   sulfamethoxazole-trimethoprim (BACTRIM DS) 800-160 MG per tablet 1 tablet  1 tablet Oral Q12H Parks Ranger, DO   1 tablet at 06/25/22 2132   traZODone (DESYREL) tablet 50 mg  50 mg Oral QHS PRN Parks Ranger, DO   50 mg at 06/16/22 2122   [START ON 06/27/2022] venlafaxine XR (EFFEXOR-XR) 24 hr capsule 225 mg  225 mg Oral Q breakfast Parks Ranger, DO        Lab Results: No results found for this or any previous visit (from the past 48 hour(s)).  Blood Alcohol level:  Lab Results  Component Value Date   ETH <10 67/05/4579    Metabolic Disorder Labs: Lab Results  Component Value Date   HGBA1C 5.5 06/10/2022   MPG 111.15 06/10/2022   MPG 105 04/05/2021   No results found for: "PROLACTIN" Lab Results  Component Value Date   CHOL 246 (H) 06/10/2022   TRIG 94 06/10/2022   HDL 57 06/10/2022   CHOLHDL 4.3 06/10/2022   VLDL 19 06/10/2022   LDLCALC 170 (H) 06/10/2022   LDLCALC 67 04/05/2021    Physical Findings: AIMS:  , ,  ,  ,    CIWA:    COWS:     Musculoskeletal: Strength & Muscle Tone: within normal limits Gait & Station: unsteady Patient leans: N/A  Psychiatric Specialty Exam:  Presentation  General Appearance:  Appropriate for Environment; Casual  Eye Contact: Good  Speech: Clear and Coherent; Normal Rate  Speech Volume: Normal  Handedness: Right   Mood and Affect  Mood: Dysphoric  Affect: Congruent   Thought Process  Thought Processes: Coherent  Descriptions of Associations:Intact  Orientation:Full (Time, Place and Person)  Thought Content:Logical  History of Schizophrenia/Schizoaffective disorder:No  Duration of Psychotic Symptoms:No data recorded Hallucinations:No data recorded Ideas of Reference:None  Suicidal Thoughts:No data recorded Homicidal Thoughts:No data recorded  Sensorium  Memory: Immediate Good; Recent  Good; Remote Good  Judgment: Good  Insight: Good   Executive Functions  Concentration: Good  Attention Span: Good  Recall: Good  Fund of Knowledge: Good  Language: Good   Psychomotor Activity  Psychomotor Activity:No data recorded  Assets  Assets: Physical Health; Resilience; Social Support; Catering manager; Housing   Sleep  Sleep:No data recorded    Blood pressure 131/64, pulse 93, temperature (!) 97.5 F (36.4 C), temperature source Oral, resp. rate 18, height '5\' 2"'$  (1.575 m), SpO2 99 %. Body mass index is 20.01 kg/m.   Treatment Plan Summary: Daily contact with patient to assess and evaluate symptoms and progress in treatment, Medication management, and Plan discontinue Risperdal because I do not want her over sedated because we are to start a low-dose of Klonopin since she has been getting as needed Ativan 3 times a day.  Increase Effexor XR to 225 and continue Zyprexa 10 at bedtime and Provigil in the morning for now.  Byers, DO 06/26/2022, 11:21 AM

## 2022-06-26 NOTE — Plan of Care (Signed)
  Problem: Elimination: Goal: Will not experience complications related to bowel motility Outcome: Progressing Goal: Will not experience complications related to urinary retention Outcome: Progressing   Problem: Coping: Goal: Level of anxiety will decrease Outcome: Not Progressing

## 2022-06-26 NOTE — [facility] Group Notes (Signed)
[place] Group Notes:  (Nursing/MHT/Case Management/Adjunct)  Date:  06/26/2022  Time:  3:46 PM  Type of Therapy:  Psychoeducational Skills  Participation Level:  Did Not Attend  Participation Quality:    Affect:    Cognitive:    Insight:    Engagement in Group:    Modes of Intervention:    Summary of Progress/Problems:  Ileene Musa 06/26/2022, 3:46 PM

## 2022-06-27 DIAGNOSIS — F332 Major depressive disorder, recurrent severe without psychotic features: Secondary | ICD-10-CM | POA: Diagnosis not present

## 2022-06-27 NOTE — Group Note (Signed)
[facility] LCSW Group Therapy Note   Group Date: 06/27/2022 Start Time: 4193 End Time: 1415   Type of Therapy/Topic:  Group Therapy:  Emotion Regulation  Participation Level:  Minimal   Mood:  Description of Group:    The purpose of this group is to assist patients in learning to regulate negative emotions and experience positive emotions. Patients will be guided to discuss ways in which they have been vulnerable to their negative emotions. These vulnerabilities will be juxtaposed with experiences of positive emotions or situations, and patients challenged to use positive emotions to combat negative ones. Special emphasis will be placed on coping with negative emotions in conflict situations, and patients will process healthy conflict resolution skills.  Therapeutic Goals: Patient will identify two positive emotions or experiences to reflect on in order to balance out negative emotions:  Patient will label two or more emotions that they find the most difficult to experience:  Patient will be able to demonstrate positive conflict resolution skills through discussion or role plays:   Summary of Patient Progress:   Patient was present for the entirety of the group session. CSW led mindfulness meditation activity for emotional regulation. Patient was an active listener and participated in the topic of discussion, but unable to provide helpful advice to others, or add nuance to topic of conversation because she continues to be confused and has non-linear thought process.     Therapeutic Modalities:   Cognitive Behavioral Therapy Feelings Identification Dialectical Behavioral Therapy   Kenzey Birkland A [place], LCSWA

## 2022-06-27 NOTE — Plan of Care (Signed)
Pt endorses anxiety however, denies depression at this time. Pt denies SI/HI/AVH or pain at this time. Pt is anxious and worried. Pt is medication compliant. Pt provided with support and encouragement. Pt monitored q15 minutes for safety per unit policy. Plan of care ongoing.   Pt has delusions that her son has shot someone. Pt believes family members are here and wants to be able to say goodbye. Pt worries about their safety and if they are okay. Pt is tearful thinking someone has gotten hurt. Pt is provided with reassurance and diversional activities.   Problem: Education: Goal: Knowledge of General Education information will improve Description: Including pain rating scale, medication(s)/side effects and non-pharmacologic comfort measures Outcome: Not Progressing   Problem: Coping: Goal: Level of anxiety will decrease Outcome: Not Progressing

## 2022-06-27 NOTE — BHH Counselor (Signed)
CSW contacted pt's son, Syriah Delisi, [phone], regarding update for pt recommendation referral to SNF.   There was no answer. CSW left contact information for son to reach back out at another time.   CSW will follow up at another more opportune time.   Tamica Covell [place], MSW, LCSW-A 1/15/202412:41 PM

## 2022-06-27 NOTE — BHH Counselor (Signed)
CSW contacted pt's son, Dore Oquin, [phone], regarding discharge. CSW informed him of PT/OT referral for SNF at discharge. He stated that he would prefer pt to return home to him and from there he would find appropriate care for pt. He stated he did not prefer pt going to a SNF at discharge.   Rigoberto Repass [place], MSW, LCSW-A 1/15/20241:22 PM

## 2022-06-27 NOTE — Progress Notes (Signed)
1:1 Hourly Rounding  0730: Pt in dayroom calm and composed with sitter at side  0830: Pt in dayroom calm and composed with sitter at side  0930: Pt in dayroom calm and composed with sitter at side  1030: Pt in dayroom anxious/fearful/worried about son with sitter at side  1130: Pt sleeping in room, breathing unlabored, raise and fall or chx observed, sitter at side  1230: Pt in dayroom calm and composed with sitter at side   1330: Pt in dayroom calm and composed with sitter at side   1430: Pt in dayroom calm and composed with sitter at side.   1530: Pt in dayroom calm and composed with sitter at side.  1630: Pt in dayroom calm and composed with sitter at side.  1730: Pt is in nurse's station, RN's are sitter at pt's side.  1830: Pt in nurses's station, RN's are sitter at pt's side  1900: Pt in dayroom visiting with vistior

## 2022-06-27 NOTE — Progress Notes (Signed)
Physical Therapy Treatment Patient Details Name: Cathy [name] MRN: [id number] DOB: [birth year] Today's Date: 06/27/2022   History of Present Illness Pt is a 77 y.o. female presenting "voluntarily to [facility] reporting worsening depressive symptoms and suicidal ideation" 06/10/22; transferred to [facility] 06/14/22.  Unwitnessed fall 06/18/22 hitting head.  PMH includes htn, vision changes, L TKA, lymphoma, depression, anxiety.    PT Comments    Pt resting in bed upon PT arrival; agreeable to therapy.  During session pt SBA with bed mobility; CGA with transfers; and CGA to min assist to ambulate 200 feet (no AD use).  Pt appearing more unsteady today compared to last time therapy saw pt (unsure if medication related and/or pt not sleeping last night?) requiring consistent assist during standing activities to prevent any falls (discussed with pt's sitter and pt's nurse).  Pt only oriented to self and thought she was at a school.  Pt was able to participate in standing activities to improve LE strength and balance (with vc's, tactile cues, and demo for technique).  Pt very pleasant but also impulsive with activities.  Will continue to focus on strengthening, balance, and progressive functional mobility during hospitalization.    Recommendations for follow up therapy are one component of a multi-disciplinary discharge planning process, led by the attending physician.  Recommendations may be updated based on patient status, additional functional criteria and insurance authorization.  Follow Up Recommendations  Skilled nursing-short term rehab (<3 hours/day) Can patient physically be transported by private vehicle: Yes   Assistance Recommended at Discharge Frequent or constant Supervision/Assistance  Patient can return home with the following A little help with walking and/or transfers;A little help with bathing/dressing/bathroom;Assistance with cooking/housework;Direct supervision/assist for medications  management;Assist for transportation;Help with stairs or ramp for entrance   Equipment Recommendations  Rolling walker (2 wheels)    Recommendations for Other Services       Precautions / Restrictions Precautions Precautions: Fall Restrictions Weight Bearing Restrictions: No     Mobility  Bed Mobility Overal bed mobility: Needs Assistance Bed Mobility: Supine to Sit     Supine to sit: Supervision     General bed mobility comments: SBA for safety    Transfers Overall transfer level: Needs assistance Equipment used: None Transfers: Sit to/from Stand Sit to Stand: Min guard           General transfer comment: CGA for safety standing from bed    Ambulation/Gait Ambulation/Gait assistance: Min guard, Min assist Gait Distance (Feet): 200 Feet Assistive device: None   Gait velocity: increased     General Gait Details: pt with more narrow BOS and often crossing feet/scissoring when walking and with loss of balance fairly frequently requiring min assist for balance   Stairs             Wheelchair Mobility    Modified Rankin (Stroke Patients Only)       Balance Overall balance assessment: Needs assistance Sitting-balance support: No upper extremity supported, Feet supported Sitting balance-Leahy Scale: Good Sitting balance - Comments: steady sitting reaching within BOS   Standing balance support: No upper extremity supported, During functional activity Standing balance-Leahy Scale: Poor Standing balance comment: fairly frequent min assist to steady during standing activities d/t loss of balance                            Cognition Arousal/Alertness: Awake/alert Behavior During Therapy: Impulsive Overall Cognitive Status: No family/caregiver present to determine baseline cognitive  functioning                                 General Comments: Oriented to self only.  Thinks she is at a school.        Exercises General  Exercises - Lower Extremity Hip ABduction/ADduction: AROM, Strengthening, Both, 10 reps, Standing (B UE support on railing) Hip Flexion/Marching: AROM, Strengthening, Both, 10 reps, Standing (single UE support on railing) Heel Raises: AROM, Strengthening, Both, 10 reps, Standing (B UE support on railing) Other Exercises Other Exercises: Standing B LE hamstring curls x10 reps with B UE support on railing Other Exercises: 3x10 seconds B SLS with single UE support on railing    General Comments  Nursing cleared pt for participation in physical therapy.  Pt agreeable to PT session.      Pertinent Vitals/Pain Pain Assessment Pain Assessment: No/denies pain    Home Living                          Prior Function            PT Goals (current goals can now be found in the care plan section) Acute Rehab PT Goals Patient Stated Goal: to improve balance PT Goal Formulation: With patient Time For Goal Achievement: 07/07/22 Potential to Achieve Goals: Fair Progress towards PT goals: Not progressing toward goals - comment (pt appearing with more balance deficits today)    Frequency    Min 2X/week      PT Plan Current plan remains appropriate    Co-evaluation              AM-PAC PT "6 Clicks" Mobility   Outcome Measure  Help needed turning from your back to your side while in a flat bed without using bedrails?: None Help needed moving from lying on your back to sitting on the side of a flat bed without using bedrails?: A Little Help needed moving to and from a bed to a chair (including a wheelchair)?: A Little Help needed standing up from a chair using your arms (e.g., wheelchair or bedside chair)?: A Little Help needed to walk in hospital room?: A Little Help needed climbing 3-5 steps with a railing? : A Little 6 Click Score: 19    End of Session Equipment Utilized During Treatment: Gait belt Activity Tolerance: Patient tolerated treatment well Patient left:  Other (comment) (sitting in recliner eating lunch with sitter sitting next to pt) Nurse Communication: Mobility status;Precautions;Other (comment) (pt's fall risk concerns) PT Visit Diagnosis: Unsteadiness on feet (R26.81);Muscle weakness (generalized) (M62.81);History of falling (Z91.81)     Time: 6153-7943 PT Time Calculation (min) (ACUTE ONLY): 23 min  Charges:  $Therapeutic Exercise: 8-22 mins $Therapeutic Activity: 8-22 mins                     Leitha Bleak, PT 06/27/22, 12:58 PM

## 2022-06-27 NOTE — Progress Notes (Signed)
[facility] MD Progress Note  06/27/2022 11:01 AM Cathy [name]  MRN:  [id number] Subjective: Cathy [name] is seen on rounds.  She slept all day yesterday and therefore did not sleep very well last night.  She is pleasant and cooperative today.  She is alert and oriented x 2.  She still talks about her son and her son's dog.  Nurses tell me that she is delusional about her son and always thinks that there is something harmful going on with him.  This seems to be fixed.  She has been compliant with medications and no side effects.  PT recommends a SNF and so far that looks like that would be most appropriate discharge plan unless she continues to improve.  Principal Problem: Major depressive disorder, recurrent episode, severe (HCC) Diagnosis: Principal Problem:   Major depressive disorder, recurrent episode, severe ([place])  Total Time spent with patient: 15 minutes  Past Psychiatric History:  She has been on Lexapro and Trintellix prescribed by her PCP.  No past psychiatric hospitalizations and no past suicide attempts.   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   Vision changes     Past Surgical History:  Procedure Laterality Date   CHOLECYSTECTOMY     IR IMAGING GUIDED PORT INSERTION  09/21/2020   LITHOTRIPSY  1984   LYMPH NODE BIOPSY     MASS BIOPSY Left 09/03/2020   Procedure: LEFT NECK JUGULAR NODE;  Surgeon: Rozetta Nunnery, MD;  Location: [facility];  Service: ENT;  Laterality: Left;   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   Procedure: LEFT TOTAL KNEE ARTHROPLASTY;  Surgeon: Gaynelle Arabian, MD;  Location: [facility];  Service: Orthopedics;  Laterality: Left;   Family History:  Family History  Problem Relation Age of Onset   Hypertension Mother    Stroke Mother    Heart attack Mother        CABG, 5 Stents   Dementia Mother    Cancer Father        Kidney   Asthma Sister    Diabetes Brother        Borderline   Alcohol abuse Brother    Cancer Maternal Aunt        stomach ca   Cancer Maternal Aunt        ovarian ca   Breast cancer Cousin    Colon cancer Neg Hx     Social History:  Social History   Substance and Sexual Activity  Alcohol Use No     Social History   Substance and Sexual Activity  Drug Use No    Social History   Socioeconomic History   Marital status: Widowed    Spouse name: Not on file   Number of children: Not on file   Years of education: Not on file   Highest education level: Not on  file  Occupational History   Not on file  Tobacco Use   Smoking status: Never   Smokeless tobacco: Never  Substance and Sexual Activity   Alcohol use: No   Drug use: No   Sexual activity: Not Currently    Birth control/protection: Post-menopausal  Other Topics Concern   Not on file  Social History Narrative   Lives alone.     Son is [name].     Social Determinants of Health   Financial Resource Strain: Not on file  Food Insecurity: No Food Insecurity (06/14/2022)   Hunger Vital Sign    Worried About Running Out of Food in the Last Year: Never true    Ran Out of Food in the Last Year: Never true  Transportation Needs: No Transportation Needs (06/14/2022)   PRAPARE - Hydrologist (Medical): No    Lack of Transportation (Non-Medical): No  Physical Activity: Not on file  Stress: Not on file  Social Connections: Not on file   Additional Social History:                          Sleep: Good  Appetite:  Good  Current Medications: Current Facility-Administered Medications  Medication Dose Route Frequency Provider Last Rate Last Admin   acetaminophen (TYLENOL) tablet 650 mg  650 mg Oral Q6H PRN Parks Ranger, DO   650 mg at 06/20/22 2121   alum & mag hydroxide-simeth (MAALOX/MYLANTA) 200-200-20 MG/5ML suspension 30 mL  30 mL Oral Q4H PRN Parks Ranger, DO       aspirin EC tablet 81 mg  81 mg Oral Daily Parks Ranger, DO   81 mg at 06/27/22 0901   atorvastatin (LIPITOR) tablet 20 mg  20 mg Oral Daily Parks Ranger, DO   20 mg at 06/27/22 0901   cholecalciferol (VITAMIN D3) 25 MCG (1000 UNIT) tablet 1,000 Units  1,000 Units Oral Daily Parks Ranger, DO   1,000 Units at 06/27/22 0902   clonazePAM (KLONOPIN) tablet 0.5 mg  0.5 mg Oral BID Parks Ranger, DO   0.5 mg at 06/27/22 2355   cyanocobalamin (VITAMIN B12) tablet 1,000 mcg  1,000 mcg Oral Daily Parks Ranger, DO   1,000 mcg at 06/27/22 0901   feeding supplement (ENSURE ENLIVE / ENSURE PLUS) liquid 237 mL  237 mL Oral TID BM Parks Ranger, DO   237 mL at 06/27/22 0915   LORazepam (ATIVAN) tablet 1 mg  1 mg Oral Q6H PRN Parks Ranger, DO   1 mg at 06/27/22 0951   losartan (COZAAR) tablet 25 mg  25 mg Oral Daily Parks Ranger, DO   25 mg at 06/27/22 0902   magnesium hydroxide (MILK OF MAGNESIA) suspension 30 mL  30 mL Oral Daily PRN Parks Ranger, DO   30 mL at 06/24/22 1006   modafinil (PROVIGIL) tablet 100 mg  100 mg Oral Daily Parks Ranger, DO   100 mg at 06/27/22 0902   multivitamin with minerals tablet 1 tablet  1 tablet Oral Daily Parks Ranger, DO   1 tablet at 06/27/22 0901   OLANZapine (ZYPREXA) tablet 10 mg  10 mg Oral QHS Parks Ranger, DO   10 mg at 06/26/22 2102   pantoprazole (PROTONIX) EC tablet 40 mg  40 mg Oral Daily Parks Ranger, DO    40 mg at 06/27/22 0902   polyethylene  glycol (MIRALAX / GLYCOLAX) packet 17 g  17 g Oral Daily Parks Ranger, DO   17 g at 06/27/22 0859   traZODone (DESYREL) tablet 50 mg  50 mg Oral QHS PRN Parks Ranger, DO   50 mg at 06/27/22 0130   venlafaxine XR (EFFEXOR-XR) 24 hr capsule 225 mg  225 mg Oral Q breakfast Parks Ranger, DO   225 mg at 06/27/22 0901    Lab Results: No results found for this or any previous visit (from the past 48 hour(s)).  Blood Alcohol level:  Lab Results  Component Value Date   ETH <10 76/73/4193    Metabolic Disorder Labs: Lab Results  Component Value Date   HGBA1C 5.5 06/10/2022   MPG 111.15 06/10/2022   MPG 105 04/05/2021   No results found for: "PROLACTIN" Lab Results  Component Value Date   CHOL 246 (H) 06/10/2022   TRIG 94 06/10/2022   HDL 57 06/10/2022   CHOLHDL 4.3 06/10/2022   VLDL 19 06/10/2022   LDLCALC 170 (H) 06/10/2022   LDLCALC 67 04/05/2021    Physical Findings: AIMS:  , ,  ,  ,    CIWA:    COWS:     Musculoskeletal: Strength & Muscle Tone: within normal limits Gait & Station: normal Patient leans: N/A  Psychiatric Specialty Exam:  Presentation  General Appearance:  Appropriate for Environment; Casual  Eye Contact: Good  Speech: Clear and Coherent; Normal Rate  Speech Volume: Normal  Handedness: Right   Mood and Affect  Mood: Dysphoric  Affect: Congruent   Thought Process  Thought Processes: Coherent  Descriptions of Associations:Intact  Orientation:Full (Time, Place and Person)  Thought Content:Logical  History of Schizophrenia/Schizoaffective disorder:No  Duration of Psychotic Symptoms:No data recorded Hallucinations:No data recorded Ideas of Reference:None  Suicidal Thoughts:No data recorded Homicidal Thoughts:No data recorded  Sensorium  Memory: Immediate Good; Recent Good; Remote Good  Judgment: Good  Insight: Good   Executive Functions   Concentration: Good  Attention Span: Good  Recall: Good  Fund of Knowledge: Good  Language: Good   Psychomotor Activity  Psychomotor Activity:No data recorded  Assets  Assets: Physical Health; Resilience; Social Support; Catering manager; Housing   Sleep  Sleep:No data recorded   Physical Exam: Physical Exam Vitals and nursing note reviewed.  Constitutional:      Appearance: Normal appearance. She is normal weight.  Neurological:     General: No focal deficit present.     Mental Status: She is alert and oriented to person, place, and time.  Psychiatric:        Attention and Perception: Attention and perception normal.        Mood and Affect: Mood and affect normal.        Speech: Speech normal.        Behavior: Behavior normal. Behavior is cooperative.        Thought Content: Thought content is delusional.        Cognition and Memory: Cognition is impaired. Memory is impaired.        Judgment: Judgment is inappropriate.    Review of Systems  Constitutional: Negative.   HENT: Negative.    Eyes: Negative.   Respiratory: Negative.    Cardiovascular: Negative.   Gastrointestinal: Negative.   Genitourinary: Negative.   Musculoskeletal: Negative.   Skin: Negative.   Neurological: Negative.   Endo/Heme/Allergies: Negative.   Psychiatric/Behavioral: Negative.     Blood pressure 125/68, pulse 89, temperature 97.7 F (36.5 C), temperature source  Oral, resp. rate 16, height '5\' 2"'$  (1.575 m), SpO2 100 %. Body mass index is 20.01 kg/m.   Treatment Plan Summary: Daily contact with patient to assess and evaluate symptoms and progress in treatment, Medication management, and Plan continue current medications.  Sea Bright, DO 06/27/2022, 11:01 AM

## 2022-06-27 NOTE — Progress Notes (Signed)
Patient remains with 1:1 sitter by the bed side for safety. Patient became anxious and agitated, Ativan and Trazodone given and was effective.

## 2022-06-28 DIAGNOSIS — F332 Major depressive disorder, recurrent severe without psychotic features: Secondary | ICD-10-CM | POA: Diagnosis not present

## 2022-06-28 MED ORDER — LORAZEPAM 0.5 MG PO TABS
0.5000 mg | ORAL_TABLET | Freq: Four times a day (QID) | ORAL | Status: DC | PRN
  Administered 2022-06-28 – 2022-07-19 (×10): 0.5 mg via ORAL
  Filled 2022-06-28 (×10): qty 1

## 2022-06-28 MED ORDER — MODAFINIL 100 MG PO TABS
200.0000 mg | ORAL_TABLET | Freq: Every day | ORAL | Status: DC
  Administered 2022-06-29 – 2022-07-06 (×8): 200 mg via ORAL
  Filled 2022-06-28 (×8): qty 2

## 2022-06-28 NOTE — Progress Notes (Signed)
Patient alert, disoriented to time, place and situation. Patient responding to internal stimuli, asking the sitter to give kids something to eat and take them to the shade since it's going to rain. Patient go more and more agitated and was given ativan and trazodone prn. Patient remains on unit with 1:1 sitter of safety and Q15 minute checks in place.

## 2022-06-28 NOTE — Progress Notes (Signed)
Hourly rounding note- Patient continues to rest with eyes closed. Respirations unlabored. Awoke briefly to take meds and then resumed sleep.

## 2022-06-28 NOTE — Progress Notes (Signed)
   06/27/22 2100  Psych Admission Type (Psych Patients Only)  Admission Status Involuntary  Psychosocial Assessment  Patient Complaints Anxiety;Confusion  Eye Contact Fair  Facial Expression Anxious;Sad;Worried  Affect Anxious;Sad  Speech Soft;Tangential;Word salad  Interaction Defensive  Motor Activity Restless;Fidgety;Unsteady  Appearance/Hygiene Unremarkable  Behavior Characteristics Anxious;Agitated;Fidgety;Restless  Thought Process  Coherency Disorganized;Tangential  Content Preoccupation;Delusions  Delusions Paranoid  Perception WDL  Hallucination None reported or observed  Judgment Impaired  Confusion Moderate  Danger to Self  Current suicidal ideation? Denies  Agreement Not to Harm Self Yes  Description of Agreement verbal  Danger to Others  Danger to Others None reported or observed

## 2022-06-28 NOTE — Progress Notes (Signed)
[facility] MD Progress Note  06/28/2022 12:29 PM Cathy [name]  MRN:  [id number] Subjective: Cathy [name] is seen on rounds.  She did get up and a little bit today.  She is overly tired today.  2 days ago the nurses were giving her 1 mg Ativan 3 times a day and she seemed to be handling that pretty well and her anxiety was a little bit better.  When she is alert she complains about depression and anxiety.  I am going to have to stop the Klonopin and decrease her Ativan since she is so sedated today.  The flip side and that is then she starts complaining of anxiety.  When she becomes more alert she might be a candidate for ECT.  Principal Problem: Major depressive disorder, recurrent episode, severe (HCC) Diagnosis: Principal Problem:   Major depressive disorder, recurrent episode, severe ([place])  Total Time spent with patient: 15 minutes  Past Psychiatric History: She has been on Lexapro and Trintellix,  prescribed by her PCP. Recent trial of Wellbutrin. No past psychiatric hospitalizations and no past suicide attempts.    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   Vision changes     Past Surgical History:  Procedure Laterality Date   CHOLECYSTECTOMY     IR IMAGING GUIDED PORT INSERTION  09/21/2020   LITHOTRIPSY  1984   LYMPH NODE BIOPSY     MASS BIOPSY Left 09/03/2020   Procedure: LEFT NECK JUGULAR NODE;  Surgeon: Rozetta Nunnery, MD;  Location: [facility];  Service: ENT;  Laterality: Left;   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   Procedure: LEFT TOTAL KNEE ARTHROPLASTY;  Surgeon: Gaynelle Arabian, MD;  Location: [facility];  Service: Orthopedics;  Laterality: Left;   Family History:  Family History  Problem Relation Age of Onset   Hypertension Mother    Stroke Mother    Heart attack Mother        CABG, 5 Stents   Dementia Mother    Cancer Father        Kidney   Asthma Sister    Diabetes Brother        Borderline   Alcohol abuse Brother    Cancer Maternal Aunt        stomach ca   Cancer Maternal Aunt        ovarian ca   Breast cancer Cousin    Colon cancer Neg Hx    Family Psychiatric  History: Unremarkable Social History:  Social History   Substance and Sexual Activity  Alcohol Use No     Social History   Substance and Sexual Activity  Drug Use No    Social History   Socioeconomic History   Marital status: Widowed    Spouse name: Not on file   Number of children: Not on file   Years of education:  Not on file   Highest education level: Not on file  Occupational History   Not on file  Tobacco Use   Smoking status: Never   Smokeless tobacco: Never  Substance and Sexual Activity   Alcohol use: No   Drug use: No   Sexual activity: Not Currently    Birth control/protection: Post-menopausal  Other Topics Concern   Not on file  Social History Narrative   Lives alone.     Son is [name].     Social Determinants of Health   Financial Resource Strain: Not on file  Food Insecurity: No Food Insecurity (06/14/2022)   Hunger Vital Sign    Worried About Running Out of Food in the Last Year: Never true    Ran Out of Food in the Last Year: Never true  Transportation Needs: No Transportation Needs (06/14/2022)   PRAPARE - Hydrologist (Medical): No    Lack of Transportation (Non-Medical): No  Physical Activity: Not on file  Stress: Not on file  Social Connections: Not on file    Additional Social History:                         Sleep: Good  Appetite:  Poor  Current Medications: Current Facility-Administered Medications  Medication Dose Route Frequency Provider Last Rate Last Admin   acetaminophen (TYLENOL) tablet 650 mg  650 mg Oral Q6H PRN Parks Ranger, DO   650 mg at 06/20/22 2121   alum & mag hydroxide-simeth (MAALOX/MYLANTA) 200-200-20 MG/5ML suspension 30 mL  30 mL Oral Q4H PRN Parks Ranger, DO       aspirin EC tablet 81 mg  81 mg Oral Daily Parks Ranger, DO   81 mg at 06/28/22 1012   atorvastatin (LIPITOR) tablet 20 mg  20 mg Oral Daily Parks Ranger, DO   20 mg at 06/28/22 1012   cholecalciferol (VITAMIN D3) 25 MCG (1000 UNIT) tablet 1,000 Units  1,000 Units Oral Daily Parks Ranger, DO   1,000 Units at 06/28/22 1012   cyanocobalamin (VITAMIN B12) tablet 1,000 mcg  1,000 mcg Oral Daily Parks Ranger, DO   1,000 mcg at 06/28/22 1012   feeding supplement (ENSURE ENLIVE / ENSURE PLUS) liquid 237 mL  237 mL Oral TID BM Parks Ranger, DO   237 mL at 06/28/22 1013   LORazepam (ATIVAN) tablet 1 mg  1 mg Oral Q6H PRN Parks Ranger, DO   1 mg at 06/27/22 2141   losartan (COZAAR) tablet 25 mg  25 mg Oral Daily Parks Ranger, DO   25 mg at 06/28/22 1013   magnesium hydroxide (MILK OF MAGNESIA) suspension 30 mL  30 mL Oral Daily PRN Parks Ranger, DO   30 mL at 06/24/22 1006   [START ON 06/29/2022] modafinil (PROVIGIL) tablet 200 mg  200 mg Oral Daily Parks Ranger, DO       multivitamin with minerals tablet 1 tablet  1 tablet Oral Daily Parks Ranger, DO   1 tablet at 06/28/22 1012   OLANZapine (ZYPREXA) tablet 10 mg  10 mg Oral QHS Parks Ranger, DO   10 mg at 06/27/22 2124   pantoprazole (PROTONIX) EC tablet 40 mg  40 mg Oral Daily Parks Ranger, DO   40 mg at 06/28/22 1012   polyethylene glycol (MIRALAX /  GLYCOLAX) packet 17 g  17 g Oral Daily Louis Meckel,  Nunzio Cory, DO   17 g at 06/28/22 1013   traZODone (DESYREL) tablet 50 mg  50 mg Oral QHS PRN Parks Ranger, DO   50 mg at 06/27/22 2134   venlafaxine XR (EFFEXOR-XR) 24 hr capsule 225 mg  225 mg Oral Q breakfast Parks Ranger, DO   225 mg at 06/28/22 1012    Lab Results: No results found for this or any previous visit (from the past 48 hour(s)).  Blood Alcohol level:  Lab Results  Component Value Date   ETH <10 16/03/9603    Metabolic Disorder Labs: Lab Results  Component Value Date   HGBA1C 5.5 06/10/2022   MPG 111.15 06/10/2022   MPG 105 04/05/2021   No results found for: "PROLACTIN" Lab Results  Component Value Date   CHOL 246 (H) 06/10/2022   TRIG 94 06/10/2022   HDL 57 06/10/2022   CHOLHDL 4.3 06/10/2022   VLDL 19 06/10/2022   LDLCALC 170 (H) 06/10/2022   LDLCALC 67 04/05/2021    Physical Findings: AIMS:  , ,  ,  ,    CIWA:    COWS:     Musculoskeletal: Strength & Muscle Tone: within normal limits Gait & Station: unsteady Patient leans: N/A  Psychiatric Specialty Exam:  Presentation  General Appearance:  Appropriate for Environment; Casual  Eye Contact: Good  Speech: Clear and Coherent; Normal Rate  Speech Volume: Normal  Handedness: Right   Mood and Affect  Mood: Dysphoric  Affect: Congruent   Thought Process  Thought Processes: Coherent  Descriptions of Associations:Intact  Orientation:Full (Time, Place and Person)  Thought Content:Logical  History of Schizophrenia/Schizoaffective disorder:No  Duration of Psychotic Symptoms:No data recorded Hallucinations:No data recorded Ideas of Reference:None  Suicidal Thoughts:No data recorded Homicidal Thoughts:No data recorded  Sensorium  Memory: Immediate Good; Recent Good; Remote Good  Judgment: Good  Insight: Good   Executive Functions  Concentration: Good  Attention  Span: Good  Recall: Good  Fund of Knowledge: Good  Language: Good   Psychomotor Activity  Psychomotor Activity:No data recorded  Assets  Assets: Physical Health; Resilience; Social Support; Catering manager; Housing   Sleep  Sleep:No data recorded   Physical Exam: Physical Exam Vitals and nursing note reviewed.  Constitutional:      Appearance: Normal appearance. She is normal weight.  Neurological:     General: No focal deficit present.     Mental Status: She is alert and oriented to person, place, and time.  Psychiatric:        Attention and Perception: She is inattentive.        Mood and Affect: Mood is depressed.        Speech: Speech is delayed.        Behavior: Behavior normal. Behavior is cooperative.        Thought Content: Thought content normal.        Cognition and Memory: Cognition is impaired. Memory is impaired.        Judgment: Judgment is inappropriate.    Review of Systems  Constitutional: Negative.   HENT: Negative.    Eyes: Negative.   Respiratory: Negative.    Cardiovascular: Negative.   Gastrointestinal: Negative.   Genitourinary: Negative.   Musculoskeletal: Negative.   Skin: Negative.   Neurological: Negative.   Endo/Heme/Allergies: Negative.   Psychiatric/Behavioral:  Positive for depression. The patient is nervous/anxious.    Blood pressure 111/63, pulse 88, temperature 98.5 F (36.9 C), temperature source Axillary, resp. rate 18, height '5\' 2"'$  (1.575 m),  SpO2 96 %. Body mass index is 20.01 kg/m.   Treatment Plan Summary: Daily contact with patient to assess and evaluate symptoms and progress in treatment, Medication management, and Plan discontinue Klonopin.  Decrease Ativan to 0.5 mg every 6 hours as needed.  Continue Effexor XR 225 mg.  Continue Zyprexa at bedtime which has helped with her hallucinations, sleep, and appetite.  Increase Provigil to 200 mg/day.    Consider ECT in the future.  Continue one-to-one.   Continue trazodone as needed.  Parks Ranger, DO 06/28/2022, 12:29 PM

## 2022-06-28 NOTE — [facility] Group Notes (Signed)
Patient did not attend group.

## 2022-06-28 NOTE — Progress Notes (Signed)
   06/28/22 1127  Group Notes  Time of group 1000  Type of therapy Psychoeducational skills (Stress Management Group)  Participation level Did not attend

## 2022-06-28 NOTE — Progress Notes (Addendum)
1 to 1 observation notes:  7am- Report provided by night shift sitter. Patient observed laying in bed sleeping peacefully. Breathing even and unlabored.   8am: Patient refused breakfast earlier this morning. Appears very drowsy and sleepy. Patient did drink a little bit of her apple juice and went back to sleep.   9am: Patient resting in bed with eyes closed. Breathing even and unlabored. No s/s of current distress.   10am: see other hourly rounding notes.   11am:Patient resting in bed with eyes closed. Breathing even and unlabored. No s/s of current distress.  12am: Patient awaken earlier to eat lunch. Patient appeared drowsy and difficulty with keeping her eyes open. Patient ate a few bites of lunch with encouragement and had half of her iced tea. Patient then assisted back to room with walker and used the bathroom.   1300: Patient resting in bed with eyes closed. Breathing even and unlabored. No s/s of current distress.  60: Patient's son called requesting to speak with patient. Patient currently still tired/sleeping and did not answer phone.  1400:  Patient resting in bed with eyes closed. Breathing even and unlabored. No s/s of current distress.  1452: OT in room with patient. Patient assisted to bathroom.   1500:  Patient resting in bed with eyes closed. Breathing even and unlabored. No s/s of current distress.  1600:  Patient resting in bed with eyes closed. Breathing even and unlabored. No s/s of current distress.  1640: Patient ambulated to bathroom using walker and with assistance.   1645: Patient eating dinner on her own this time and appears slightly more awake. Less drowsy.  1700: Patient assisted with taking a shower. Patient able to wash herself with minimal assistance although does require guidance and continues to be a fall risk. Patient used bath chair to sit.   1750: Patient back in bed. Reports still feeling sleepy. Resting. Breathing even and unlabored. No s/s  of current distress.   1800: Patient resting in bed with eyes closed. Breathing even and unlabored. No s/s of current distress.

## 2022-06-28 NOTE — Plan of Care (Signed)
Pt endorses anxiety however, denies depression at this time. Pt denies/endorses SI/HI/AVH or pain at this time. Pt is calm and cooperative. Pt is medication compliant. Pt provided with support and encouragement. Pt monitored q15 minutes for safety per unit policy. Plan of care ongoing.   Problem: Education: Goal: Knowledge of General Education information will improve Description: Including pain rating scale, medication(s)/side effects and non-pharmacologic comfort measures Outcome: Not Progressing   Problem: Coping: Goal: Level of anxiety will decrease Outcome: Not Progressing

## 2022-06-28 NOTE — Group Note (Signed)
LCSW Group Therapy Note  Group Date: 06/28/2022 Start Time: 1500 End Time: 1535   Type of Therapy and Topic:  Group Therapy - Healthy vs Unhealthy Coping Skills  Participation Level:  Did Not Attend   Description of Group The focus of this group was to determine what unhealthy coping techniques typically are used by group members and what healthy coping techniques would be helpful in coping with various problems. Patients were guided in becoming aware of the differences between healthy and unhealthy coping techniques. Patients were asked to identify 2-3 healthy coping skills they would like to learn to use more effectively.  Therapeutic Goals Patients learned that coping is what human beings do all day long to deal with various situations in their lives Patients defined and discussed healthy vs unhealthy coping techniques Patients identified their preferred coping techniques and identified whether these were healthy or unhealthy Patients determined 2-3 healthy coping skills they would like to become more familiar with and use more often. Patients provided support and ideas to each other   Summary of Patient Progress:    X  Therapeutic Modalities Cognitive Behavioral Therapy Motivational Interviewing  Cathy [name], [place] 06/28/2022  4:00 PM

## 2022-06-28 NOTE — Progress Notes (Signed)
The patient is resting in her room and sleeping intermittently. She remains disoriented and thought that she was in school and running late for a class. Re-orientation provided.

## 2022-06-28 NOTE — Progress Notes (Signed)
Occupational Therapy Treatment Patient Details Name: Cathy [name] MRN: [id number] DOB: May 08, 1946 Today's Date: 06/28/2022   History of present illness Pt is a 77 y.o. female presenting "voluntarily to [facility] reporting worsening depressive symptoms and suicidal ideation" 06/10/22; transferred to [facility] 06/14/22.  Unwitnessed fall 06/18/22 hitting head.  PMH includes htn, vision changes, L TKA, lymphoma, depression, anxiety.   OT comments  Upon entering the room, pt sleeping soundly with sitter present. Pt is pleasant during session but lethargic. She awakens to voice and sits on EOB with min A. Pt ambulates 20' to bathroom with RW and min A for toileting needs. Pt needing increased cuing secondary to lethargy. She is able to manage LB clothing and hygiene with min A and mod multimodal cuing for sequencing and initiation. Pt falling asleep while seated on commode and assisted back to bed secondary to continued lethargy.    Recommendations for follow up therapy are one component of a multi-disciplinary discharge planning process, led by the attending physician.  Recommendations may be updated based on patient status, additional functional criteria and insurance authorization.    Follow Up Recommendations  Skilled nursing-short term rehab (<3 hours/day)     Assistance Recommended at Discharge Frequent or constant Supervision/Assistance  Patient can return home with the following  A little help with walking and/or transfers;A little help with bathing/dressing/bathroom;Help with stairs or ramp for entrance;Assist for transportation;Direct supervision/assist for financial management;Direct supervision/assist for medications management;Assistance with cooking/housework   Equipment Recommendations  Other (comment) (RW)    Recommendations for Other Services Speech consult (MD notified)    Precautions / Restrictions Precautions Precautions: Fall       Mobility Bed Mobility Overal bed mobility:  Needs Assistance Bed Mobility: Supine to Sit, Sit to Supine     Supine to sit: Min assist Sit to supine: Min assist   General bed mobility comments: assistance for B LEs secondary to lethargy    Transfers Overall transfer level: Needs assistance Equipment used: Rolling walker (2 wheels) Transfers: Sit to/from Stand Sit to Stand: Min assist                 Balance Overall balance assessment: Needs assistance Sitting-balance support: No upper extremity supported, Feet supported Sitting balance-Leahy Scale: Fair     Standing balance support: No upper extremity supported, During functional activity, Reliant on assistive device for balance Standing balance-Leahy Scale: Poor                             ADL either performed or assessed with clinical judgement   ADL Overall ADL's : Needs assistance/impaired     Grooming: Wash/dry hands;Standing;Minimal assistance Grooming Details (indicate cue type and reason): posterior [name]                 Toilet Transfer: Minimal assistance;Rolling walker (2 wheels)   Toileting- Clothing Manipulation and Hygiene: Minimal assistance;Sit to/from stand              Extremity/Trunk Assessment Upper Extremity Assessment Upper Extremity Assessment: Generalized weakness   Lower Extremity Assessment Lower Extremity Assessment: Generalized weakness        Vision Patient Visual Report: No change from baseline            Cognition Arousal/Alertness: Awake/alert, Lethargic Behavior During Therapy: Impulsive Overall Cognitive Status: No family/caregiver present to determine baseline cognitive functioning  General Comments: Pt is oriented to self only. Eyes closed during majority of the session.                   Pertinent Vitals/ Pain       Pain Assessment Pain Assessment: No/denies pain         Frequency  Min 2X/week        Progress Toward  Goals  OT Goals(current goals can now be found in the care plan section)  Progress towards OT goals: Progressing toward goals  Acute Rehab OT Goals Patient Stated Goal: to rest OT Goal Formulation: With patient Time For Goal Achievement: 07/07/22 Potential to Achieve Goals: Good  Plan Discharge plan remains appropriate;Frequency remains appropriate       AM-PAC OT "6 Clicks" Daily Activity     Outcome Measure   Help from another person eating meals?: A Little Help from another person taking care of personal grooming?: A Little Help from another person toileting, which includes using toliet, bedpan, or urinal?: A Little Help from another person bathing (including washing, rinsing, drying)?: A Little Help from another person to put on and taking off regular upper body clothing?: A Little Help from another person to put on and taking off regular lower body clothing?: A Little 6 Click Score: 18    End of Session Equipment Utilized During Treatment: Rolling walker (2 wheels)  OT Visit Diagnosis: Unsteadiness on feet (R26.81);Repeated falls (R29.6);Muscle weakness (generalized) (M62.81)   Activity Tolerance Patient tolerated treatment well   Patient Left in bed;with nursing/sitter in room   Nurse Communication Mobility status;Other (comment) (RN expressed concerns with swallowing and meds)        Time: 1440-1500 OT Time Calculation (min): 20 min  Charges: OT General Charges $OT Visit: 1 Visit OT Treatments $Self Care/Home Management : 8-22 mins  Darleen Crocker, MS, OTR/L , CBIS ascom [phone]  06/28/22, 3:13 PM

## 2022-06-29 DIAGNOSIS — F332 Major depressive disorder, recurrent severe without psychotic features: Secondary | ICD-10-CM | POA: Diagnosis not present

## 2022-06-29 LAB — URINALYSIS, ROUTINE W REFLEX MICROSCOPIC
Bilirubin Urine: NEGATIVE
Glucose, UA: NEGATIVE mg/dL
Hgb urine dipstick: NEGATIVE
Ketones, ur: NEGATIVE mg/dL
Leukocytes,Ua: NEGATIVE
Nitrite: NEGATIVE
Protein, ur: NEGATIVE mg/dL
Specific Gravity, Urine: 1.018 (ref 1.005–1.030)
pH: 5 (ref 5.0–8.0)

## 2022-06-29 MED ORDER — TRAZODONE HCL 50 MG PO TABS: 25.0000 mg | ORAL_TABLET | Freq: Every evening | ORAL | Status: DC | PRN

## 2022-06-29 MED ORDER — OLANZAPINE 5 MG PO TABS
5.0000 mg | ORAL_TABLET | Freq: Every day | ORAL | Status: DC
  Administered 2022-06-29 – 2022-07-26 (×26): 5 mg via ORAL
  Filled 2022-06-29 (×26): qty 1

## 2022-06-29 NOTE — Progress Notes (Signed)
[facility] MD Progress Note  06/29/2022 11:39 AM Cathy [name]  MRN:  [id number] Subjective: Cathy [name] is seen on rounds.  She still one-to-one.  She still very sleepy but I was able to wake her up and ask if she felt sleepy and she said yes.  She is answering questions appropriately.  She did get up and ate breakfast this morning but not very much.  She was receiving as needed Ativan on a regular basis and did not seem to have any problems with that but then when I put her on Klonopin to avoid PRNs she became more obtunded.  Medical had go down on her Zyprexa and trazodone today.  We will get some lab work in the morning.  Principal Problem: Major depressive disorder, recurrent episode, severe (HCC) Diagnosis: Principal Problem:   Major depressive disorder, recurrent episode, severe ([place])  Total Time spent with patient: 15 minutes  Past Psychiatric History:  She has been on Lexapro and Trintellix,  prescribed by her PCP. Recent trial of Wellbutrin. No past psychiatric hospitalizations and no past suicide attempts.     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   Vision changes     Past Surgical History:  Procedure Laterality Date   CHOLECYSTECTOMY     IR IMAGING GUIDED PORT INSERTION  09/21/2020   LITHOTRIPSY  1984   LYMPH NODE BIOPSY     MASS BIOPSY Left 09/03/2020   Procedure: LEFT NECK JUGULAR NODE;  Surgeon: Rozetta Nunnery, MD;  Location: [facility];  Service: ENT;  Laterality: Left;   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   Procedure: LEFT TOTAL KNEE ARTHROPLASTY;  Surgeon: Gaynelle Arabian, MD;  Location: [facility];  Service: Orthopedics;  Laterality: Left;   Family History:  Family History  Problem Relation Age of Onset   Hypertension Mother    Stroke Mother    Heart attack Mother        CABG, 5 Stents   Dementia Mother    Cancer Father        Kidney   Asthma Sister    Diabetes Brother        Borderline   Alcohol abuse Brother    Cancer Maternal Aunt        stomach ca   Cancer Maternal Aunt        ovarian ca   Breast cancer Cousin    Colon cancer Neg Hx     Social History:  Social History   Substance and Sexual Activity  Alcohol Use No     Social History   Substance and Sexual Activity  Drug Use No    Social History   Socioeconomic History   Marital status: Widowed    Spouse name: Not on file   Number of children: Not on file   Years of education: Not on file  Highest education level: Not on file  Occupational History   Not on file  Tobacco Use   Smoking status: Never   Smokeless tobacco: Never  Substance and Sexual Activity   Alcohol use: No   Drug use: No   Sexual activity: Not Currently    Birth control/protection: Post-menopausal  Other Topics Concern   Not on file  Social History Narrative   Lives alone.     Son is [name].     Social Determinants of Health   Financial Resource Strain: Not on file  Food Insecurity: No Food Insecurity (06/14/2022)   Hunger Vital Sign    Worried About Running Out of Food in the Last Year: Never true    Ran Out of Food in the Last Year: Never true  Transportation Needs: No Transportation Needs (06/14/2022)   PRAPARE - Hydrologist (Medical): No    Lack of Transportation (Non-Medical): No  Physical Activity: Not on file  Stress: Not on file  Social Connections: Not on file   Additional Social History:                          Sleep: Good  Appetite:  Poor  Current Medications: Current Facility-Administered Medications  Medication Dose Route Frequency Provider Last Rate Last Admin   acetaminophen (TYLENOL) tablet 650 mg  650 mg Oral Q6H PRN Parks Ranger, DO   650 mg at 06/20/22 2121   alum & mag hydroxide-simeth (MAALOX/MYLANTA) 200-200-20 MG/5ML suspension 30 mL  30 mL Oral Q4H PRN Parks Ranger, DO       aspirin EC tablet 81 mg  81 mg Oral Daily Parks Ranger, DO   81 mg at 06/29/22 0912   atorvastatin (LIPITOR) tablet 20 mg  20 mg Oral Daily Parks Ranger, DO   20 mg at 06/29/22 0912   cholecalciferol (VITAMIN D3) 25 MCG (1000 UNIT) tablet 1,000 Units  1,000 Units Oral Daily Parks Ranger, DO   1,000 Units at 06/29/22 0912   cyanocobalamin (VITAMIN B12) tablet 1,000 mcg  1,000 mcg Oral Daily Parks Ranger, DO   1,000 mcg at 06/29/22 0912   feeding supplement (ENSURE ENLIVE / ENSURE PLUS) liquid 237 mL  237 mL Oral TID BM Parks Ranger, DO   237 mL at 06/29/22 0913   LORazepam (ATIVAN) tablet 0.5 mg  0.5 mg Oral Q6H PRN Parks Ranger, DO   0.5 mg at 06/28/22 2145   losartan (COZAAR) tablet 25 mg  25 mg Oral Daily Parks Ranger, DO   25 mg at 06/29/22 0912   magnesium hydroxide (MILK OF MAGNESIA) suspension 30 mL  30 mL Oral Daily PRN Parks Ranger, DO   30 mL at 06/24/22 1006   modafinil (PROVIGIL) tablet 200 mg  200 mg Oral Daily Parks Ranger, DO   200 mg at 06/29/22 0912   multivitamin with minerals tablet 1 tablet  1 tablet Oral Daily Parks Ranger, DO   1 tablet at 06/29/22 0912   OLANZapine (ZYPREXA) tablet 5 mg  5 mg Oral QHS Parks Ranger, DO       pantoprazole (PROTONIX) EC tablet 40 mg  40 mg Oral Daily Parks Ranger, DO   40 mg at 06/29/22 0912   polyethylene glycol (MIRALAX / GLYCOLAX) packet 17 g  17 g Oral Daily Parks Ranger, DO    17 g at  06/29/22 0913   traZODone (DESYREL) tablet 25 mg  25 mg Oral QHS PRN Parks Ranger, DO       venlafaxine XR (EFFEXOR-XR) 24 hr capsule 225 mg  225 mg Oral Q breakfast Parks Ranger, DO   225 mg at 06/29/22 5329    Lab Results: No results found for this or any previous visit (from the past 48 hour(s)).  Blood Alcohol level:  Lab Results  Component Value Date   ETH <10 92/42/6834    Metabolic Disorder Labs: Lab Results  Component Value Date   HGBA1C 5.5 06/10/2022   MPG 111.15 06/10/2022   MPG 105 04/05/2021   No results found for: "PROLACTIN" Lab Results  Component Value Date   CHOL 246 (H) 06/10/2022   TRIG 94 06/10/2022   HDL 57 06/10/2022   CHOLHDL 4.3 06/10/2022   VLDL 19 06/10/2022   LDLCALC 170 (H) 06/10/2022   LDLCALC 67 04/05/2021    Physical Findings: AIMS:  , ,  ,  ,    CIWA:    COWS:     Musculoskeletal: Strength & Muscle Tone: within normal limits Gait & Station: unsteady Patient leans: N/A  Psychiatric Specialty Exam:  Presentation  General Appearance:  Appropriate for Environment; Casual  Eye Contact: Good  Speech: Clear and Coherent; Normal Rate  Speech Volume: Normal  Handedness: Right   Mood and Affect  Mood: Dysphoric  Affect: Congruent   Thought Process  Thought Processes: Coherent  Descriptions of Associations:Intact  Orientation:Full (Time, Place and Person)  Thought Content:Logical  History of Schizophrenia/Schizoaffective disorder:No  Duration of Psychotic Symptoms:No data recorded Hallucinations:No data recorded Ideas of Reference:None  Suicidal Thoughts:No data recorded Homicidal Thoughts:No data recorded  Sensorium  Memory: Immediate Good; Recent Good; Remote Good  Judgment: Good  Insight: Good   Executive Functions  Concentration: Good  Attention Span: Good  Recall: Good  Fund of Knowledge: Good  Language: Good   Psychomotor Activity  Psychomotor  Activity:No data recorded  Assets  Assets: Physical Health; Resilience; Social Support; Catering manager; Housing   Sleep  Sleep:No data recorded   Physical Exam: Physical Exam Vitals and nursing note reviewed.  Constitutional:      Appearance: Normal appearance. She is normal weight.  Neurological:     General: No focal deficit present.     Mental Status: She is alert and oriented to person, place, and time.  Psychiatric:        Attention and Perception: Attention and perception normal.        Mood and Affect: Mood is anxious and depressed. Affect is flat.        Speech: Speech normal.        Behavior: Behavior normal. Behavior is cooperative.        Thought Content: Thought content normal.        Cognition and Memory: Cognition and memory normal.        Judgment: Judgment normal.    Review of Systems  Constitutional: Negative.   HENT: Negative.    Eyes: Negative.   Respiratory: Negative.    Cardiovascular: Negative.   Gastrointestinal: Negative.   Genitourinary: Negative.   Musculoskeletal: Negative.   Skin: Negative.   Neurological: Negative.   Endo/Heme/Allergies: Negative.   Psychiatric/Behavioral: Negative.     Blood pressure 120/72, pulse 99, temperature 98.1 F (36.7 C), temperature source Oral, resp. rate 14, height '5\' 2"'$  (1.575 m), SpO2 94 %. Body mass index is 20.01 kg/m.   Treatment Plan Summary: Daily  contact with patient to assess and evaluate symptoms and progress in treatment, Medication management, and Plan decrease Zyprexa to 5 mg at bedtime and trazodone to 25 mg as needed at bedtime.  CBC, CMP, UA.  Parks Ranger, DO 06/29/2022, 11:39 AM

## 2022-06-29 NOTE — Plan of Care (Addendum)
Pt denies anxiety/depression at this time. Pt denies SI/HI/AVH or pain at this time. Pt is calm and cooperative. Pt is medication compliant. Pt provided with support and encouragement. Pt monitored q15 minutes for safety per unit policy. Plan of care ongoing.     Problem: Education: Goal: Knowledge of General Education information will improve Description: Including pain rating scale, medication(s)/side effects and non-pharmacologic comfort measures Outcome: Not Progressing   Problem: Activity: Goal: Imbalance in normal sleep/wake cycle will improve Outcome: Not Progressing

## 2022-06-29 NOTE — Progress Notes (Signed)
0730: Pt in bedroom sleeping, breathing is unlabored, raise and fall of chx observed, sitter at side.  0830: Pt in dayroom calm and composed with sitter at side.  0930: Pt in dayroom sleeping, breathing is unlabored, raise and fall of chx observed, sitter at side.  1030: Pt in dayroom sleeping, breathing is unlabored, raise and fall of chx observed, sitter at side.  1130: Pt in dayroom sleeping, breathing is unlabored, raise and fall of chx observed, sitter at side.  1230: Pt in bedroom sleeping, breathing is unlabored, raise and fall of chx observed, sitter at side  1330: Pt in bedroom sleeping, breathing is unlabored, raise and fall of chx observed, sitter at side  1430: Pt in bedroom sleeping, breathing is unlabored, raise and fall of chx observed, sitter at side  1530: Pt in bedroom sleeping, breathing is unlabored, raise and fall of chx observed, sitter at side  1630: Pt in dayroom calm and composed with sitter at side.  1730: Pt in dayroom calm and composed with sitter at side.  1830: Pt in dayroom calm and composed with sitter at side.  1900: Pt with visitor visiting.

## 2022-06-29 NOTE — Progress Notes (Signed)
   06/29/22 0000  Psych Admission Type (Psych Patients Only)  Admission Status Involuntary  Psychosocial Assessment  Patient Complaints None  Eye Contact Fair  Facial Expression Anxious;Sad;Worried  Affect Anxious;Sad  Speech Soft;Tangential  Interaction Defensive  Motor Activity Unsteady;Restless;Fidgety  Appearance/Hygiene Unremarkable  Behavior Characteristics Cooperative;Appropriate to situation;Anxious  Mood Anxious  Thought Process  Coherency Disorganized;Tangential  Content Preoccupation;Delusions  Delusions Paranoid  Perception WDL  Hallucination None reported or observed  Judgment Impaired  Confusion Moderate  Danger to Self  Current suicidal ideation? Denies  Agreement Not to Harm Self Yes  Description of Agreement verbal  Danger to Others  Danger to Others None reported or observed

## 2022-06-29 NOTE — Group Note (Signed)
[facility] LCSW Group Therapy Note   Group Date: 06/29/2022 Start Time: 7026 End Time: 1330  Type of Therapy and Topic:  Group Therapy:  Feelings around Relapse and Recovery  Participation Level:  Did Not Attend   Mood:  Description of Group:    Patients in this group will discuss emotions they experience before and after a relapse. They will process how experiencing these feelings, or avoidance of experiencing them, relates to having a relapse. Facilitator will guide patients to explore emotions they have related to recovery. Patients will be encouraged to process which emotions are more powerful. They will be guided to discuss the emotional reaction significant others in their lives may have to patients' relapse or recovery. Patients will be assisted in exploring ways to respond to the emotions of others without this contributing to a relapse.  Therapeutic Goals: Patient will identify two or more emotions that lead to relapse for them:  Patient will identify two emotions that result when they relapse:  Patient will identify two emotions related to recovery:  Patient will demonstrate ability to communicate their needs through discussion and/or role plays.   Summary of Patient Progress:  X   Therapeutic Modalities:   Cognitive Behavioral Therapy Solution-Focused Therapy Assertiveness Training Relapse Prevention Therapy   Tia Hieronymus A [place], LCSWA

## 2022-06-29 NOTE — Progress Notes (Signed)
Physical Therapy Treatment Patient Details Name: Cathy [name] MRN: [id number] DOB: [birth year] Today's Date: 06/29/2022   History of Present Illness Pt is a 77 y.o. female presenting "voluntarily to [facility] reporting worsening depressive symptoms and suicidal ideation" 06/10/22; transferred to [facility] 06/14/22.  Unwitnessed fall 06/18/22 hitting head.  PMH includes htn, vision changes, L TKA, lymphoma, depression, anxiety.    PT Comments    Pt seen for PT tx with pt agreeable. Pt is AxOx4 on this date, following simple commands. Pt is able to complete transfers from various surfaces (recliner, toilet) with supervision<>min assist with cuing to use grab bar in bathroom PRN. Pt ambulates around unit without AD with CGA<>Min assist with impaired gait pattern as noted below. Pt with continent void on toilet, performing peri hygiene without assistance. Pt completes 10x STS from recliner without BUE support & min assist with cuing for increased anterior weight shifting with pt able to complete, but maintains crouched position in standing vs full upright extension. Pt engaged in single leg stance with & without UE support with min<>Mod assist with focus on balance, & retrograde gait with focus on high level dynamic balance. During retrograde gait pt demonstrates worsening shuffled gait & foot clearance, decreased balance, increased gait speed & decreased safety awareness. Pt presents with flexed hips & knees throughout all mobility & would likely benefit from stretching program. Will continue to follow pt acutely.    Recommendations for follow up therapy are one component of a multi-disciplinary discharge planning process, led by the attending physician.  Recommendations may be updated based on patient status, additional functional criteria and insurance authorization.  Follow Up Recommendations  Skilled nursing-short term rehab (<3 hours/day) Can patient physically be transported by private vehicle: Yes    Assistance Recommended at Discharge Frequent or constant Supervision/Assistance  Patient can return home with the following A little help with walking and/or transfers;A little help with bathing/dressing/bathroom;Assistance with cooking/housework;Direct supervision/assist for medications management;Assist for transportation;Help with stairs or ramp for entrance   Equipment Recommendations  Rolling walker (2 wheels)    Recommendations for Other Services       Precautions / Restrictions Precautions Precautions: Fall Restrictions Weight Bearing Restrictions: No     Mobility  Bed Mobility               General bed mobility comments: not tested, pt received & left sitting in recliner    Transfers Overall transfer level: Needs assistance Equipment used: None Transfers: Sit to/from Stand Sit to Stand: Min assist, Supervision           General transfer comment: STS from toilet (cuing to use grab bars PRN), supervision<>min assist with cuing/facilitation for increased anterior shift    Ambulation/Gait Ambulation/Gait assistance: Min assist, Min guard Gait Distance (Feet): 150 Feet Assistive device: None Gait Pattern/deviations: Decreased step length - right, Decreased step length - left, Decreased dorsiflexion - right, Decreased stride length, Decreased dorsiflexion - left, Shuffle, Narrow base of support Gait velocity: decreased     General Gait Details: Decreased heel strike BLE   Stairs             Wheelchair Mobility    Modified Rankin (Stroke Patients Only)       Balance Overall balance assessment: Needs assistance Sitting-balance support: No upper extremity supported, Feet supported Sitting balance-Leahy Scale: Fair Sitting balance - Comments: supervision sitting on toilet   Standing balance support: No upper extremity supported, During functional activity Standing balance-Leahy Scale: Poor  Cognition  Arousal/Alertness: Awake/alert Behavior During Therapy: WFL for tasks assessed/performed Overall Cognitive Status: No family/caregiver present to determine baseline cognitive functioning                                 General Comments: Pt oriented to self (name, age), oriented to location, year & situation. Follows simple commands throughout session.        Exercises      General Comments        Pertinent Vitals/Pain Pain Assessment Pain Assessment: Faces Faces Pain Scale: No hurt    Home Living                          Prior Function            PT Goals (current goals can now be found in the care plan section) Acute Rehab PT Goals Patient Stated Goal: to improve balance PT Goal Formulation: With patient Time For Goal Achievement: 07/07/22 Potential to Achieve Goals: Fair Progress towards PT goals: Progressing toward goals    Frequency    Min 2X/week      PT Plan Current plan remains appropriate    Co-evaluation              AM-PAC PT "6 Clicks" Mobility   Outcome Measure  Help needed turning from your back to your side while in a flat bed without using bedrails?: None Help needed moving from lying on your back to sitting on the side of a flat bed without using bedrails?: A Little Help needed moving to and from a bed to a chair (including a wheelchair)?: A Little Help needed standing up from a chair using your arms (e.g., wheelchair or bedside chair)?: A Little Help needed to walk in hospital room?: A Little Help needed climbing 3-5 steps with a railing? : A Little 6 Click Score: 19    End of Session Equipment Utilized During Treatment: Gait belt Activity Tolerance: Patient tolerated treatment well Patient left:  (in chair in dayroom)   PT Visit Diagnosis: Unsteadiness on feet (R26.81);Muscle weakness (generalized) (M62.81);History of falling (Z91.81)     Time: 7517-0017 PT Time Calculation (min) (ACUTE ONLY): 10  min  Charges:  $Therapeutic Activity: 8-22 mins                     Lavone Nian, PT, DPT 06/29/22, 9:59 AM    Waunita Schooner 06/29/2022, 9:57 AM

## 2022-06-30 DIAGNOSIS — F332 Major depressive disorder, recurrent severe without psychotic features: Secondary | ICD-10-CM | POA: Diagnosis not present

## 2022-06-30 LAB — CBC WITH DIFFERENTIAL/PLATELET
Abs Immature Granulocytes: 0.03 10*3/uL (ref 0.00–0.07)
Basophils Absolute: 0 10*3/uL (ref 0.0–0.1)
Basophils Relative: 0 %
Eosinophils Absolute: 0.2 10*3/uL (ref 0.0–0.5)
Eosinophils Relative: 3 %
HCT: 37 % (ref 36.0–46.0)
Hemoglobin: 11.9 g/dL — ABNORMAL LOW (ref 12.0–15.0)
Immature Granulocytes: 0 %
Lymphocytes Relative: 16 %
Lymphs Abs: 1.1 10*3/uL (ref 0.7–4.0)
MCH: 29.4 pg (ref 26.0–34.0)
MCHC: 32.2 g/dL (ref 30.0–36.0)
MCV: 91.4 fL (ref 80.0–100.0)
Monocytes Absolute: 0.8 10*3/uL (ref 0.1–1.0)
Monocytes Relative: 12 %
Neutro Abs: 4.6 10*3/uL (ref 1.7–7.7)
Neutrophils Relative %: 69 %
Platelets: 242 10*3/uL (ref 150–400)
RBC: 4.05 MIL/uL (ref 3.87–5.11)
RDW: 13.6 % (ref 11.5–15.5)
WBC: 6.7 10*3/uL (ref 4.0–10.5)
nRBC: 0 % (ref 0.0–0.2)

## 2022-06-30 LAB — COMPREHENSIVE METABOLIC PANEL
ALT: 20 U/L (ref 0–44)
AST: 27 U/L (ref 15–41)
Albumin: 3.2 g/dL — ABNORMAL LOW (ref 3.5–5.0)
Alkaline Phosphatase: 55 U/L (ref 38–126)
Anion gap: 6 (ref 5–15)
BUN: 22 mg/dL (ref 8–23)
CO2: 26 mmol/L (ref 22–32)
Calcium: 8.2 mg/dL — ABNORMAL LOW (ref 8.9–10.3)
Chloride: 105 mmol/L (ref 98–111)
Creatinine, Ser: 0.72 mg/dL (ref 0.44–1.00)
GFR, Estimated: >60 mL/min (ref >60)
Glucose, Bld: 107 mg/dL — ABNORMAL HIGH (ref 70–99)
Potassium: 4.2 mmol/L (ref 3.5–5.1)
Sodium: 137 mmol/L (ref 135–145)
Total Bilirubin: 0.5 mg/dL (ref 0.3–1.2)
Total Protein: 5.7 g/dL — ABNORMAL LOW (ref 6.5–8.1)

## 2022-06-30 MED ORDER — BLISTEX MEDICATED EX OINT
TOPICAL_OINTMENT | CUTANEOUS | Status: DC | PRN
  Filled 2022-06-30: qty 6.3

## 2022-06-30 NOTE — BH IP Treatment Plan (Signed)
Interdisciplinary Treatment and Diagnostic Plan Update  06/30/2022 Time of Session: 8:30AM Cathy [name] MRN: [id number]  Principal Diagnosis: Major depressive disorder, recurrent episode, severe ([place])  Secondary Diagnoses: Principal Problem:   Major depressive disorder, recurrent episode, severe ([place])   Current Medications:  Current Facility-Administered Medications  Medication Dose Route Frequency Provider Last Rate Last Admin   acetaminophen (TYLENOL) tablet 650 mg  650 mg Oral Q6H PRN Parks Ranger, DO   650 mg at 06/20/22 2121   alum & mag hydroxide-simeth (MAALOX/MYLANTA) 200-200-20 MG/5ML suspension 30 mL  30 mL Oral Q4H PRN Parks Ranger, DO       aspirin EC tablet 81 mg  81 mg Oral Daily Parks Ranger, DO   81 mg at 06/29/22 0912   atorvastatin (LIPITOR) tablet 20 mg  20 mg Oral Daily Parks Ranger, DO   20 mg at 06/29/22 0912   cholecalciferol (VITAMIN D3) 25 MCG (1000 UNIT) tablet 1,000 Units  1,000 Units Oral Daily Parks Ranger, DO   1,000 Units at 06/29/22 0912   cyanocobalamin (VITAMIN B12) tablet 1,000 mcg  1,000 mcg Oral Daily Parks Ranger, DO   1,000 mcg at 06/29/22 0912   feeding supplement (ENSURE ENLIVE / ENSURE PLUS) liquid 237 mL  237 mL Oral TID BM Parks Ranger, DO   237 mL at 06/29/22 2011   LORazepam (ATIVAN) tablet 0.5 mg  0.5 mg Oral Q6H PRN Parks Ranger, DO   0.5 mg at 06/28/22 2145   losartan (COZAAR) tablet 25 mg  25 mg Oral Daily Parks Ranger, DO   25 mg at 06/29/22 0912   magnesium hydroxide (MILK OF MAGNESIA) suspension 30 mL  30 mL Oral Daily PRN Parks Ranger, DO   30 mL at 06/24/22 1006   modafinil (PROVIGIL) tablet 200 mg  200 mg Oral Daily Parks Ranger, DO   200 mg at 06/29/22 7673   multivitamin with minerals tablet 1 tablet  1 tablet Oral Daily Parks Ranger, DO   1 tablet at 06/29/22 0912   OLANZapine (ZYPREXA) tablet 5  mg  5 mg Oral QHS Parks Ranger, DO   5 mg at 06/29/22 2305   pantoprazole (PROTONIX) EC tablet 40 mg  40 mg Oral Daily Parks Ranger, DO   40 mg at 06/29/22 0912   polyethylene glycol (MIRALAX / GLYCOLAX) packet 17 g  17 g Oral Daily Parks Ranger, DO   17 g at 06/29/22 0913   traZODone (DESYREL) tablet 25 mg  25 mg Oral QHS PRN Parks Ranger, DO       venlafaxine XR (EFFEXOR-XR) 24 hr capsule 225 mg  225 mg Oral Q breakfast Parks Ranger, DO   225 mg at 06/29/22 4193   PTA Medications: Medications Prior to Admission  Medication Sig Dispense Refill Last Dose   albuterol (VENTOLIN HFA) 108 (90 Base) MCG/ACT inhaler Inhale 2 puffs into the lungs every 6 (six) hours as needed for wheezing or shortness of breath. 8 g 2    aspirin EC 81 MG tablet Take 81 mg by mouth daily.      atorvastatin (LIPITOR) 20 MG tablet Take 1 tablet (20 mg total) by mouth daily. 90 tablet 1    cetirizine (ZYRTEC) 10 MG tablet Take 1 tablet (10 mg total) by mouth daily. 90 tablet 1    cholecalciferol (CHOLECALCIFEROL) 25 MCG tablet Take 1 tablet (1,000 Units total) by mouth daily.  cyanocobalamin (VITAMIN B12) 1000 MCG tablet Take 1 tablet (1,000 mcg total) by mouth daily.      denosumab (PROLIA) 60 MG/ML SOSY injection Inject 60 mg into the skin every 6 (six) months.      estradiol (ESTRACE) 0.1 MG/GM vaginal cream Discard plastic applicator. Insert blueberry size amount of cream on finger in vagina daily x1 week then 2x per week.      fluticasone furoate-vilanterol (BREO ELLIPTA) 100-25 MCG/ACT AEPB Inhale 1 puff into the lungs daily. (Patient taking differently: Inhale 1 puff into the lungs daily as needed.) 60 each 5    glucose blood (ACCU-CHEK AVIVA PLUS) test strip Use to Monitor blood sugars daily. DX. E11.9 100 each 5    hydrochlorothiazide (MICROZIDE) 12.5 MG capsule Take 1 capsule (12.5 mg total) by mouth daily. 90 capsule 1    hydroxypropyl methylcellulose /  hypromellose (ISOPTO TEARS / GONIOVISC) 2.5 % ophthalmic solution Place 2 drops into both eyes 3 (three) times daily as needed for dry eyes.      lidocaine-prilocaine (EMLA) cream Apply to affected area once (Patient taking differently: 1 Application as needed. Apply to affected area to port as needed) 30 g 3    losartan (COZAAR) 100 MG tablet TAKE 1 TABLET BY MOUTH ONCE A DAY 90 tablet 1    metoprolol succinate (TOPROL-XL) 50 MG 24 hr tablet Take 1 tablet (50 mg total) by mouth daily. Take with or immediately following a meal. 90 tablet 1    Multiple Vitamin (MULTIVITAMIN) capsule Take 1 capsule by mouth daily.      omeprazole (PRILOSEC) 20 MG capsule Take 1 capsule (20 mg total) by mouth daily. 90 capsule 1    venlafaxine XR (EFFEXOR XR) 75 MG 24 hr capsule Take 2 capsules (150 mg total) by mouth daily with breakfast. (Patient taking differently: Take 75 mg by mouth daily with breakfast.)       Patient Stressors:    Patient Strengths:    Treatment Modalities: Medication Management, Group therapy, Case management,  1 to 1 session with clinician, Psychoeducation, Recreational therapy.   Physician Treatment Plan for Primary Diagnosis: Major depressive disorder, recurrent episode, severe ([place]) Long Term Goal(s): Improvement in symptoms so as ready for discharge   Short Term Goals: Ability to identify changes in lifestyle to reduce recurrence of condition will improve Ability to verbalize feelings will improve Ability to disclose and discuss suicidal ideas Ability to demonstrate self-control will improve Ability to identify and develop effective coping behaviors will improve Ability to maintain clinical measurements within normal limits will improve Compliance with prescribed medications will improve Ability to identify triggers associated with substance abuse/mental health issues will improve  Medication Management: Evaluate patient's response, side effects, and tolerance of medication  regimen.  Therapeutic Interventions: 1 to 1 sessions, Unit Group sessions and Medication administration.  Evaluation of Outcomes: Progressing  Physician Treatment Plan for Secondary Diagnosis: Principal Problem:   Major depressive disorder, recurrent episode, severe ([place])  Long Term Goal(s): Improvement in symptoms so as ready for discharge   Short Term Goals: Ability to identify changes in lifestyle to reduce recurrence of condition will improve Ability to verbalize feelings will improve Ability to disclose and discuss suicidal ideas Ability to demonstrate self-control will improve Ability to identify and develop effective coping behaviors will improve Ability to maintain clinical measurements within normal limits will improve Compliance with prescribed medications will improve Ability to identify triggers associated with substance abuse/mental health issues will improve     Medication Management:  Evaluate patient's response, side effects, and tolerance of medication regimen.  Therapeutic Interventions: 1 to 1 sessions, Unit Group sessions and Medication administration.  Evaluation of Outcomes: Progressing   RN Treatment Plan for Primary Diagnosis: Major depressive disorder, recurrent episode, severe ([place]) Long Term Goal(s): Knowledge of disease and therapeutic regimen to maintain health will improve  Short Term Goals: Ability to remain free from injury will improve, Ability to verbalize frustration and anger appropriately will improve, Ability to demonstrate self-control, Ability to participate in decision making will improve, Ability to verbalize feelings will improve, Ability to identify and develop effective coping behaviors will improve, and Compliance with prescribed medications will improve  Medication Management: RN will administer medications as ordered by provider, will assess and evaluate patient's response and provide education to patient for prescribed medication. RN  will report any adverse and/or side effects to prescribing provider.  Therapeutic Interventions: 1 on 1 counseling sessions, Psychoeducation, Medication administration, Evaluate responses to treatment, Monitor vital signs and CBGs as ordered, Perform/monitor CIWA, COWS, AIMS and Fall Risk screenings as ordered, Perform wound care treatments as ordered.  Evaluation of Outcomes: Progressing   LCSW Treatment Plan for Primary Diagnosis: Major depressive disorder, recurrent episode, severe ([place]) Long Term Goal(s): Safe transition to appropriate next level of care at discharge, Engage patient in therapeutic group addressing interpersonal concerns.  Short Term Goals: Engage patient in aftercare planning with referrals and resources, Increase social support, Increase ability to appropriately verbalize feelings, Increase emotional regulation, Facilitate acceptance of mental health diagnosis and concerns, and Increase skills for wellness and recovery  Therapeutic Interventions: Assess for all discharge needs, 1 to 1 time with Social worker, Explore available resources and support systems, Assess for adequacy in community support network, Educate family and significant other(s) on suicide prevention, Complete Psychosocial Assessment, Interpersonal group therapy.  Evaluation of Outcomes: Progressing   Progress in Treatment: Attending groups: No. Participating in groups: No. Taking medication as prescribed: Yes. Toleration medication: Yes. Family/Significant other contact made: Yes, individual(s) contacted:  SPE completed with pt's son Gara Kincade Patient understands diagnosis: Yes. Discussing patient identified problems/goals with staff: Yes. Medical problems stabilized or resolved: Yes. Denies suicidal/homicidal ideation: Yes. Issues/concerns per patient self-inventory: No. Other: None  New problem(s) identified: No, Describe:  None  New Short Term/Long Term Goal(s): Patient to work towards  medication management for mood stabilization; elimination of SI thoughts; development of comprehensive mental wellness plan. Update 07/10/22: No changes at this time.    Patient Goals: No additional goals identified at this time. Patient to continue to work towards original goals identified in initial treatment team meeting. CSW will remain available to patient should they voice additional treatment goals. Update 07/10/22: No changes at this time.    Discharge Plan or Barriers: No psychosocial barriers identified at this time, patient to return to place of residence when appropriate for discharge. Update 07/10/22: PT/OT recommended pt discharge to SNF, CSW spoke with pt's son who stated he was interested in taking pt to his home instead of SNF at discharge and finding care for pt from there.    Reason for Continuation of Hospitalization: Depression Medication stabilization   Estimated Length of Stay: TBD   Last 3 [place] Suicide Severity Risk Score: Flowsheet Row Admission (Current) from 06/14/2022 in [facility] ED from 06/10/2022 in [facility] Admission (Discharged) from 09/03/2020 in [facility] No Risk No Risk No Risk       Last PHQ 2/9 Scores:  08/10/2020    9:35 AM 01/13/2020    3:59 PM 07/16/2019    9:36 AM  Depression screen PHQ 2/9  Decreased Interest 0 0 0  Down, Depressed, Hopeless 0 0 0  PHQ - 2 Score 0 0 0    Scribe for Treatment Team: Tilden Broz A [place], Narcissa 06/30/2022 9:05 AM

## 2022-06-30 NOTE — BHH Group Notes (Signed)
[facility] Group Notes:  (Nursing/MHT/Case Management/Adjunct)  Date:  06/30/2022  Time:  5:36 PM  Type of Therapy:  Music Therapy  Participation Level:  Did Not Attend  Participation Quality:  Drowsy  Affect:  Appropriate  Cognitive:  Alert  Insight:  Good  Engagement in Group:  None  Modes of Intervention:  Activity  Summary of Progress/Problems:  Cathy [name] l Cathy [name] 06/30/2022, 5:36 PM

## 2022-06-30 NOTE — Progress Notes (Signed)
Patient is alert and oriented times 3. Mood and affect appropriate. Patient denies pain. She denies SI, HI, and AVH. Also denies feelings of anxiety and depression at this time. States she slept good last night. Morning meds given whole by mouth W/O difficulty. Ate breakfast in day room- appetite good. Patient remains on unit with Q15 minute checks in place.

## 2022-06-30 NOTE — Progress Notes (Signed)
Occupational Therapy Treatment Patient Details Name: Cathy [name] MRN: [id number] DOB: [birth year] Today's Date: 06/30/2022   History of present illness Pt is a 77 y.o. female presenting "voluntarily to [facility] reporting worsening depressive symptoms and suicidal ideation" 06/10/22; transferred to [facility] 06/14/22.  Unwitnessed fall 06/18/22 hitting head.  PMH includes htn, vision changes, L TKA, lymphoma, depression, anxiety.   OT comments  OT arriving to unit and pt is noted to be standing and talking on wall phone. Pt ambulates in hallway back to room without LOB or use of RW. Pt sits on EOB and greets therapist upon entering the room. Pt is oriented and appropriate throughout session. OT gives pt the SLUMS assessment this session. The SLUMS is a 30 point, 11 question screening questionnaire that tests orientation, memory, attention, and executive function. Pt scoring a 28/30 which is within normal limits for someone with high school education or higher. Pt able to correctly answer orientation questions during session and able to provide some situational awareness. She endorses some shame over illness during session and therapist utilized therapeutic use of self. Recommendation changed to include no OT follow up with as needed assistance at discharge. Pt does not need further skilled OT intervention at this time. OT to complete order.    Recommendations for follow up therapy are one component of a multi-disciplinary discharge planning process, led by the attending physician.  Recommendations may be updated based on patient status, additional functional criteria and insurance authorization.    Follow Up Recommendations  No OT follow up     Assistance Recommended at Discharge PRN     Equipment Recommendations  None recommended by OT       Precautions / Restrictions Precautions Precautions: Fall       Mobility Bed Mobility Overal bed mobility: Independent                   Transfers Overall transfer level: Independent Equipment used: None                     Balance Overall balance assessment: Needs assistance Sitting-balance support: No upper extremity supported, Feet supported Sitting balance-Leahy Scale: Normal     Standing balance support: No upper extremity supported, During functional activity Standing balance-Leahy Scale: Good                             ADL either performed or assessed with clinical judgement   ADL Overall ADL's : At baseline;Independent                                            Extremity/Trunk Assessment Upper Extremity Assessment Upper Extremity Assessment: Overall WFL for tasks assessed   Lower Extremity Assessment Lower Extremity Assessment: Overall WFL for tasks assessed        Vision Patient Visual Report: No change from baseline            Cognition Arousal/Alertness: Awake/alert Behavior During Therapy: WFL for tasks assessed/performed Overall Cognitive Status: Within Functional Limits for tasks assessed  Pertinent Vitals/ Pain       Pain Assessment Pain Assessment: No/denies pain      Progress Toward Goals  OT Goals(current goals can now be found in the care plan section)  Progress towards OT goals: Goals met/education completed, patient discharged from [facility] Discharge plan needs to be updated       AM-PAC OT "6 Clicks" Daily Activity     Outcome Measure   Help from another person eating meals?: None Help from another person taking care of personal grooming?: None Help from another person toileting, which includes using toliet, bedpan, or urinal?: None Help from another person bathing (including washing, rinsing, drying)?: None Help from another person to put on and taking off regular upper body clothing?: None Help from another person to put on and taking off regular  lower body clothing?: None 6 Click Score: 24       Activity Tolerance Patient tolerated treatment well   Patient Left in bed   Nurse Communication Mobility status;Other (comment) (SLUM score)        Time: 6945-0388 OT Time Calculation (min): 25 min  Charges: OT General Charges $OT Visit: 1 Visit OT Treatments $Therapeutic Activity: 23-37 mins  Darleen Crocker, MS, OTR/L , CBIS ascom [phone]  06/30/22, 2:56 PM

## 2022-06-30 NOTE — Progress Notes (Signed)
Patient has improved greatly over the past few days. She is alert and oriented times 4. Speech is clear. Mobility is WDL. Spoke with MD who gave verbal order to d/c 1:1.

## 2022-06-30 NOTE — BHH Group Notes (Signed)
   [facility] Group Notes:  (Nursing/MHT/Case Management/Adjunct)   Date:  06/30/2022  Time:  8:06 PM   Type of Therapy:  Chair Yoga   Participation Level:  Active   Participation Quality:  Good   Affect:  Appropriate   Cognitive:  Alert   Insight:  Good   Engagement in Group:  Participated in exercises  Modes of Intervention:  Activity

## 2022-06-30 NOTE — Progress Notes (Signed)
   06/30/22 0829  Psych Admission Type (Psych Patients Only)  Admission Status Involuntary  Psychosocial Assessment  Patient Complaints None  Eye Contact Fair  Facial Expression Flat  Affect Flat  Speech Soft  Interaction Assertive  Motor Activity Unsteady  Appearance/Hygiene Unremarkable  Behavior Characteristics Cooperative;Appropriate to situation  Mood Pleasant  Thought Process  Coherency WDL  Content WDL  Delusions None reported or observed  Perception WDL  Hallucination None reported or observed  Judgment Impaired  Confusion Moderate  Danger to Self  Current suicidal ideation? Denies  Danger to Others  Danger to Others None reported or observed

## 2022-06-30 NOTE — Plan of Care (Signed)
Pt calm, cooperative, pleasant, coherent in evening. Pt ambulating well with walker. Pt declined Ensure shake, stated " I don't need it, I am eating better." Overnight pt woke up x 2 confused, walked down the hall, asked this writer if she could speak to son as she was about to walk in another pts room. This Probation officer re-oriented pt to reality, pt went back to bed. Pt used the call bell at 0545, asked this writer if someone else was  in her room. Pt thought the bathroom door looked like a person. This writer turned on the lights, so that pt could visualize room. Pt was apologizing for calling, this writer encouraged pt to call for any concerns and provided emotional support. Pt reported having some anxiety, but did not want medication at this time. Pt resting in bed. 24 hour sleep total 9.25 hours.    Problem: Education: Goal: Knowledge of General Education information will improve Description: Including pain rating scale, medication(s)/side effects and non-pharmacologic comfort measures Outcome: Progressing   Problem: Health Behavior/Discharge Planning: Goal: Ability to manage health-related needs will improve Outcome: Progressing   Problem: Clinical Measurements: Goal: Ability to maintain clinical measurements within normal limits will improve Outcome: Progressing Goal: Will remain free from infection Outcome: Progressing Goal: Diagnostic test results will improve Outcome: Progressing Goal: Respiratory complications will improve Outcome: Progressing Goal: Cardiovascular complication will be avoided Outcome: Progressing   Problem: Activity: Goal: Risk for activity intolerance will decrease Outcome: Progressing   Problem: Nutrition: Goal: Adequate nutrition will be maintained Outcome: Progressing   Problem: Coping: Goal: Level of anxiety will decrease Outcome: Progressing   Problem: Elimination: Goal: Will not experience complications related to bowel motility Outcome:  Progressing Goal: Will not experience complications related to urinary retention Outcome: Progressing   Problem: Pain Managment: Goal: General experience of comfort will improve Outcome: Progressing   Problem: Safety: Goal: Ability to remain free from injury will improve Outcome: Progressing   Problem: Skin Integrity: Goal: Risk for impaired skin integrity will decrease Outcome: Progressing   Problem: Education: Goal: Utilization of techniques to improve thought processes will improve Outcome: Progressing Goal: Knowledge of the prescribed therapeutic regimen will improve Outcome: Progressing   Problem: Activity: Goal: Interest or engagement in leisure activities will improve Outcome: Progressing Goal: Imbalance in normal sleep/wake cycle will improve Outcome: Progressing   Problem: Coping: Goal: Coping ability will improve Outcome: Progressing Goal: Will verbalize feelings Outcome: Progressing   Problem: Health Behavior/Discharge Planning: Goal: Ability to make decisions will improve Outcome: Progressing Goal: Compliance with therapeutic regimen will improve Outcome: Progressing   Problem: Role Relationship: Goal: Will demonstrate positive changes in social behaviors and relationships Outcome: Progressing   Problem: Safety: Goal: Ability to disclose and discuss suicidal ideas will improve Outcome: Progressing Goal: Ability to identify and utilize support systems that promote safety will improve Outcome: Progressing   Problem: Self-Concept: Goal: Will verbalize positive feelings about self Outcome: Progressing Goal: Level of anxiety will decrease Outcome: Progressing

## 2022-06-30 NOTE — Progress Notes (Signed)
D: Pt alert and oriented x 2. Pt denies experiencing any anxiety/depression. Pt denies experiencing any pain. Pt denies experiencing any SI/HI, or AVH at time of assessment.    A: Scheduled medications administered as ordered. Support and encouragement provided.  Routine safety checks conducted q15 minutes, assigned 1:1 staff present at all times.   R: No adverse drug reactions noted. Pt is agreeable to notifying staff with any safety concerns. Pt. is complaint with medications. Pt interacts minimally and appropriately with others on the unit. Pt remains safe at present, will continue to plan of care.

## 2022-06-30 NOTE — Group Note (Signed)
[facility] LCSW Group Therapy Note   Group Date: 06/30/2022 Start Time: 1250 End Time: 1325   Type of Therapy/Topic:  Group Therapy:  Balance in Life  Participation Level:  Active   Description of Group:    This group will address the concept of balance and how it feels and looks when one is unbalanced. Patients will be encouraged to process areas in their lives that are out of balance, and identify reasons for remaining unbalanced. Facilitators will guide patients utilizing problem- solving interventions to address and correct the stressor making their life unbalanced. Understanding and applying boundaries will be explored and addressed for obtaining  and maintaining a balanced life. Patients will be encouraged to explore ways to assertively make their unbalanced needs known to significant others in their lives, using other group members and facilitator for support and feedback.  Therapeutic Goals: Patient will identify two or more emotions or situations they have that consume much of in their lives. Patient will identify signs/triggers that life has become out of balance:  Patient will identify two ways to set boundaries in order to achieve balance in their lives:  Patient will demonstrate ability to communicate their needs through discussion and/or role plays  Summary of Patient Progress:    Patient was present for the entirety of the group session. Patient was an active listener and participated in the topic of discussion, provided helpful advice to others, and added nuance to topic of conversation.  She stated that she was looking forward to going home. When asked about her achieving balance she said "I was too busy running around." She said she feels anxious about paying for her admission.     Therapeutic Modalities:   Cognitive Behavioral Therapy Solution-Focused Therapy Assertiveness Training   Leona A [place], LCSWA

## 2022-06-30 NOTE — Plan of Care (Signed)
  Problem: Education: Goal: Knowledge of General Education information will improve Description: Including pain rating scale, medication(s)/side effects and non-pharmacologic comfort measures Outcome: Progressing   Problem: Health Behavior/Discharge Planning: Goal: Ability to manage health-related needs will improve Outcome: Progressing   Problem: Clinical Measurements: Goal: Ability to maintain clinical measurements within normal limits will improve Outcome: Progressing Goal: Will remain free from infection Outcome: Progressing Goal: Diagnostic test results will improve Outcome: Progressing Goal: Respiratory complications will improve Outcome: Progressing Goal: Cardiovascular complication will be avoided Outcome: Progressing   Problem: Coping: Goal: Level of anxiety will decrease Outcome: Progressing   Problem: Nutrition: Goal: Adequate nutrition will be maintained Outcome: Progressing   Problem: Self-Concept: Goal: Will verbalize positive feelings about self Outcome: Progressing Goal: Level of anxiety will decrease Outcome: Progressing   Problem: Safety: Goal: Ability to disclose and discuss suicidal ideas will improve Outcome: Progressing Goal: Ability to identify and utilize support systems that promote safety will improve Outcome: Progressing

## 2022-06-30 NOTE — Progress Notes (Signed)
[facility] MD Progress Note  06/30/2022 10:00 AM Cathy [name]  MRN:  [id number] Subjective: Cathy [name] is seen on rounds.  She is up for breakfast this morning.  She is a little lot more alert.  She said hi to me.  She is interacting more with peers and staff.  She is tolerating the medication changes.  I have been decreasing her medicine over the last few days due to over sedation.  She still complains of depression and anxiety.  She looks better.  If she continues to improve she may not need a SNF.  Principal Problem: Major depressive disorder, recurrent episode, severe (HCC) Diagnosis: Principal Problem:   Major depressive disorder, recurrent episode, severe ([place])  Total Time spent with patient: 15 minutes  Past Psychiatric History:  She has been on Lexapro and Trintellix,  prescribed by her PCP. Recent trial of Wellbutrin. No past psychiatric hospitalizations and no past suicide attempts.      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   Vision changes     Past Surgical History:  Procedure Laterality Date   CHOLECYSTECTOMY     IR IMAGING GUIDED PORT INSERTION  09/21/2020   LITHOTRIPSY  1984   LYMPH NODE BIOPSY     MASS BIOPSY Left 09/03/2020   Procedure: LEFT NECK JUGULAR NODE;  Surgeon: Rozetta Nunnery, MD;  Location: [place];  Service: ENT;  Laterality: Left;   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   Procedure: LEFT TOTAL KNEE  ARTHROPLASTY;  Surgeon: Gaynelle Arabian, MD;  Location: [facility];  Service: Orthopedics;  Laterality: Left;   Family History:  Family History  Problem Relation Age of Onset   Hypertension Mother    Stroke Mother    Heart attack Mother        CABG, 5 Stents   Dementia Mother    Cancer Father        Kidney   Asthma Sister    Diabetes Brother        Borderline   Alcohol abuse Brother    Cancer Maternal Aunt        stomach ca   Cancer Maternal Aunt        ovarian ca   Breast cancer Cousin    Colon cancer Neg Hx     Social History:  Social History   Substance and Sexual Activity  Alcohol Use No     Social History   Substance and Sexual Activity  Drug Use No    Social History   Socioeconomic History   Marital status: Widowed    Spouse name: Not on file   Number of children: Not on file   Years of education: Not on file   Highest education level: Not on file  Occupational History   Not on file  Tobacco Use   Smoking status: Never  Smokeless tobacco: Never  Substance and Sexual Activity   Alcohol use: No   Drug use: No   Sexual activity: Not Currently    Birth control/protection: Post-menopausal  Other Topics Concern   Not on file  Social History Narrative   Lives alone.     Son is [name].     Social Determinants of Health   Financial Resource Strain: Not on file  Food Insecurity: No Food Insecurity (06/14/2022)   Hunger Vital Sign    Worried About Running Out of Food in the Last Year: Never true    Ran Out of Food in the Last Year: Never true  Transportation Needs: No Transportation Needs (06/14/2022)   PRAPARE - Hydrologist (Medical): No    Lack of Transportation (Non-Medical): No  Physical Activity: Not on file  Stress: Not on file  Social Connections: Not on file   Additional Social History:                         Sleep: Good  Appetite:  Fair  Current Medications: Current Facility-Administered  Medications  Medication Dose Route Frequency Provider Last Rate Last Admin   acetaminophen (TYLENOL) tablet 650 mg  650 mg Oral Q6H PRN Parks Ranger, DO   650 mg at 06/20/22 2121   alum & mag hydroxide-simeth (MAALOX/MYLANTA) 200-200-20 MG/5ML suspension 30 mL  30 mL Oral Q4H PRN Parks Ranger, DO       aspirin EC tablet 81 mg  81 mg Oral Daily Parks Ranger, DO   81 mg at 06/29/22 0912   atorvastatin (LIPITOR) tablet 20 mg  20 mg Oral Daily Parks Ranger, DO   20 mg at 06/29/22 0912   cholecalciferol (VITAMIN D3) 25 MCG (1000 UNIT) tablet 1,000 Units  1,000 Units Oral Daily Parks Ranger, DO   1,000 Units at 06/29/22 5916   cyanocobalamin (VITAMIN B12) tablet 1,000 mcg  1,000 mcg Oral Daily Parks Ranger, DO   1,000 mcg at 06/29/22 0912   feeding supplement (ENSURE ENLIVE / ENSURE PLUS) liquid 237 mL  237 mL Oral TID BM Parks Ranger, DO   237 mL at 06/29/22 2011   LORazepam (ATIVAN) tablet 0.5 mg  0.5 mg Oral Q6H PRN Parks Ranger, DO   0.5 mg at 06/28/22 2145   losartan (COZAAR) tablet 25 mg  25 mg Oral Daily Parks Ranger, DO   25 mg at 06/29/22 0912   magnesium hydroxide (MILK OF MAGNESIA) suspension 30 mL  30 mL Oral Daily PRN Parks Ranger, DO   30 mL at 06/24/22 1006   modafinil (PROVIGIL) tablet 200 mg  200 mg Oral Daily Parks Ranger, DO   200 mg at 06/29/22 3846   multivitamin with minerals tablet 1 tablet  1 tablet Oral Daily Parks Ranger, DO   1 tablet at 06/29/22 0912   OLANZapine (ZYPREXA) tablet 5 mg  5 mg Oral QHS Parks Ranger, DO   5 mg at 06/29/22 2305   pantoprazole (PROTONIX) EC tablet 40 mg  40 mg Oral Daily Parks Ranger, DO   40 mg at 06/29/22 0912   polyethylene glycol (MIRALAX / GLYCOLAX) packet 17 g  17 g Oral Daily Parks Ranger, DO   17 g at 06/29/22 0913   traZODone (DESYREL) tablet 25 mg  25 mg Oral QHS PRN Parks Ranger, DO  venlafaxine XR (EFFEXOR-XR) 24 hr capsule 225 mg  225 mg Oral Q breakfast Parks Ranger, DO   225 mg at 06/29/22 0347    Lab Results:  Results for orders placed or performed during the hospital encounter of 06/14/22 (from the past 48 hour(s))  Urinalysis, Routine w reflex microscopic Urine, Clean Catch     Status: Abnormal   Collection Time: 06/29/22  9:38 AM  Result Value Ref Range   Color, Urine YELLOW (A) YELLOW   APPearance HAZY (A) CLEAR   Specific Gravity, Urine 1.018 1.005 - 1.030   pH 5.0 5.0 - 8.0   Glucose, UA NEGATIVE NEGATIVE mg/dL   Hgb urine dipstick NEGATIVE NEGATIVE   Bilirubin Urine NEGATIVE NEGATIVE   Ketones, ur NEGATIVE NEGATIVE mg/dL   Protein, ur NEGATIVE NEGATIVE mg/dL   Nitrite NEGATIVE NEGATIVE   Leukocytes,Ua NEGATIVE NEGATIVE    Comment: Performed at [facility], [address] [place], [place] [address]  CBC with Differential/Platelet     Status: Abnormal   Collection Time: 06/30/22  7:01 AM  Result Value Ref Range   WBC 6.7 4.0 - 10.5 K/uL   RBC 4.05 3.87 - 5.11 MIL/uL   Hemoglobin 11.9 (L) 12.0 - 15.0 g/dL   HCT 37.0 36.0 - 46.0 %   MCV 91.4 80.0 - 100.0 fL   MCH 29.4 26.0 - 34.0 pg   MCHC 32.2 30.0 - 36.0 g/dL   RDW 13.6 11.5 - 15.5 %   Platelets 242 150 - 400 K/uL   nRBC 0.0 0.0 - 0.2 %   Neutrophils Relative % 69 %   Neutro Abs 4.6 1.7 - 7.7 K/uL   Lymphocytes Relative 16 %   Lymphs Abs 1.1 0.7 - 4.0 K/uL   Monocytes Relative 12 %   Monocytes Absolute 0.8 0.1 - 1.0 K/uL   Eosinophils Relative 3 %   Eosinophils Absolute 0.2 0.0 - 0.5 K/uL   Basophils Relative 0 %   Basophils Absolute 0.0 0.0 - 0.1 K/uL   Immature Granulocytes 0 %   Abs Immature Granulocytes 0.03 0.00 - 0.07 K/uL    Comment: Performed at [facility], [address] [place], [place] [address]  Comprehensive metabolic panel     Status: Abnormal   Collection Time: 06/30/22  7:01 AM  Result Value Ref Range   Sodium  137 135 - 145 mmol/L   Potassium 4.2 3.5 - 5.1 mmol/L   Chloride 105 98 - 111 mmol/L   CO2 26 22 - 32 mmol/L   Glucose, Bld 107 (H) 70 - 99 mg/dL    Comment: Glucose reference range applies only to samples taken after fasting for at least 8 hours.   BUN 22 8 - 23 mg/dL   Creatinine, Ser 0.72 0.44 - 1.00 mg/dL   Calcium 8.2 (L) 8.9 - 10.3 mg/dL   Total Protein 5.7 (L) 6.5 - 8.1 g/dL   Albumin 3.2 (L) 3.5 - 5.0 g/dL   AST 27 15 - 41 U/L   ALT 20 0 - 44 U/L   Alkaline Phosphatase 55 38 - 126 U/L   Total Bilirubin 0.5 0.3 - 1.2 mg/dL   GFR, Estimated >60 >60 mL/min    Comment: (NOTE) Calculated using the CKD-EPI Creatinine Equation (2021)    Anion gap 6 5 - 15    Comment: Performed at [facility], [address], [place], [place] [address]    Blood Alcohol level:  Lab Results  Component Value Date  ETH <10 14/43/1540    Metabolic Disorder Labs: Lab Results  Component Value Date   HGBA1C 5.5 06/10/2022   MPG 111.15 06/10/2022   MPG 105 04/05/2021   No results found for: "PROLACTIN" Lab Results  Component Value Date   CHOL 246 (H) 06/10/2022   TRIG 94 06/10/2022   HDL 57 06/10/2022   CHOLHDL 4.3 06/10/2022   VLDL 19 06/10/2022   LDLCALC 170 (H) 06/10/2022   LDLCALC 67 04/05/2021    Physical Findings: AIMS:  , ,  ,  ,    CIWA:    COWS:     Musculoskeletal: Strength & Muscle Tone: within normal limits Gait & Station: normal Patient leans: N/A  Psychiatric Specialty Exam:  Presentation  General Appearance:  Appropriate for Environment; Casual  Eye Contact: Good  Speech: Clear and Coherent; Normal Rate  Speech Volume: Normal  Handedness: Right   Mood and Affect  Mood: Dysphoric  Affect: Congruent   Thought Process  Thought Processes: Coherent  Descriptions of Associations:Intact  Orientation:Full (Time, Place and Person)  Thought Content:Logical  History of Schizophrenia/Schizoaffective disorder:No  Duration of  Psychotic Symptoms:No data recorded Hallucinations:No data recorded Ideas of Reference:None  Suicidal Thoughts:No data recorded Homicidal Thoughts:No data recorded  Sensorium  Memory: Immediate Good; Recent Good; Remote Good  Judgment: Good  Insight: Good   Executive Functions  Concentration: Good  Attention Span: Good  Recall: Good  Fund of Knowledge: Good  Language: Good   Psychomotor Activity  Psychomotor Activity:No data recorded  Assets  Assets: Physical Health; Resilience; Social Support; Catering manager; Housing   Sleep  Sleep:No data recorded   Physical Exam: Physical Exam Vitals and nursing note reviewed.  Constitutional:      Appearance: Normal appearance. She is normal weight.  Neurological:     General: No focal deficit present.     Mental Status: She is alert and oriented to person, place, and time.  Psychiatric:        Attention and Perception: Attention and perception normal.        Mood and Affect: Mood is depressed. Affect is labile.        Speech: Speech normal.        Behavior: Behavior normal. Behavior is cooperative.        Thought Content: Thought content normal.        Cognition and Memory: Cognition and memory normal.        Judgment: Judgment normal.    Review of Systems  Constitutional: Negative.   HENT: Negative.    Eyes: Negative.   Respiratory: Negative.    Cardiovascular: Negative.   Gastrointestinal: Negative.   Genitourinary: Negative.   Musculoskeletal: Negative.   Skin: Negative.   Neurological: Negative.   Endo/Heme/Allergies: Negative.   Psychiatric/Behavioral:  Positive for depression. The patient is nervous/anxious.    Blood pressure 131/68, pulse 96, temperature 98.3 F (36.8 C), temperature source Oral, resp. rate 19, height '5\' 2"'$  (1.575 m), SpO2 96 %. Body mass index is 20.01 kg/m.   Treatment Plan Summary: Daily contact with patient to assess and evaluate symptoms and progress  in treatment, Medication management, and Plan continue current medications.  [place], DO 06/30/2022, 10:00 AM

## 2022-07-01 DIAGNOSIS — F332 Major depressive disorder, recurrent severe without psychotic features: Secondary | ICD-10-CM | POA: Diagnosis not present

## 2022-07-01 MED ORDER — MIRTAZAPINE 15 MG PO TABS
7.5000 mg | ORAL_TABLET | Freq: Every day | ORAL | Status: DC
  Administered 2022-07-01 – 2022-07-26 (×24): 7.5 mg via ORAL
  Filled 2022-07-01 (×24): qty 1

## 2022-07-01 NOTE — Progress Notes (Signed)
Physical Therapy Treatment Patient Details Name: Cathy [name] MRN: [id number] DOB: 08-12-1945 Today's Date: 07/01/2022   History of Present Illness Pt is a 77 y.o. female presenting "voluntarily to [facility] reporting worsening depressive symptoms and suicidal ideation" 06/10/22; transferred to [facility] 06/14/22.  Unwitnessed fall 06/18/22 hitting head.  PMH includes htn, vision changes, L TKA, lymphoma, depression, anxiety.    PT Comments    Pt resting in recliner in her room (in the dark) upon PT arrival; pt reporting being upset that her son was locked behind a door across the hall and initially did not want to go in hallway d/t her sister being behind [place] door.  Pt focused on her son mostly (was concerned about him) which limited sessions activities (nurse notified of pt's current cognition concerns).  Despite above, pt still able to safely transfer and ambulate in hallway using RW.  PT recommendations updated to HHPT (MD notified).   Recommendations for follow up therapy are one component of a multi-disciplinary discharge planning process, led by the attending physician.  Recommendations may be updated based on patient status, additional functional criteria and insurance authorization.  Follow Up Recommendations  Home health PT Can patient physically be transported by private vehicle: Yes   Assistance Recommended at Discharge Intermittent Supervision/Assistance  Patient can return home with the following A little help with walking and/or transfers;A little help with bathing/dressing/bathroom;Assistance with cooking/housework;Direct supervision/assist for medications management;Direct supervision/assist for financial management;Assist for transportation;Help with stairs or ramp for entrance   Equipment Recommendations  Rolling walker (2 wheels)    Recommendations for Other Services       Precautions / Restrictions Precautions Precautions: Fall Restrictions Weight Bearing  Restrictions: No     Mobility  Bed Mobility               General bed mobility comments: Deferred    Transfers Overall transfer level: Modified independent Equipment used: Rolling walker (2 wheels) Transfers: Sit to/from Stand Sit to Stand: Modified independent (Device/Increase time)           General transfer comment: steady safe transfer from recliner    Ambulation/Gait Ambulation/Gait assistance: Min guard Gait Distance (Feet): 200 Feet Assistive device: Rolling walker (2 wheels) Gait Pattern/deviations: Step-through pattern Gait velocity: decreased     General Gait Details: more narrow BOS but steady with RW use   Stairs             Wheelchair Mobility    Modified Rankin (Stroke Patients Only)       Balance Overall balance assessment: Needs assistance Sitting-balance support: No upper extremity supported, Feet supported Sitting balance-Leahy Scale: Normal Sitting balance - Comments: steady sitting reaching outside BOS   Standing balance support: Bilateral upper extremity supported, During functional activity, Reliant on assistive device for balance Standing balance-Leahy Scale: Good Standing balance comment: steady ambulating with RW use                            Cognition Arousal/Alertness: Awake/alert Behavior During Therapy:  (pt appearing upset and confused) Overall Cognitive Status: Impaired/Different from baseline                                 General Comments: Pt oriented to self only.        Exercises      General Comments  Nursing cleared pt for participation in physical therapy.  Pt agreeable to PT session.      Pertinent Vitals/Pain Pain Assessment Pain Assessment: No/denies pain Pain Intervention(s): Limited activity within patient's tolerance, Monitored during session, Repositioned    Home Living                          Prior Function            PT Goals (current  goals can now be found in the care plan section) Acute Rehab PT Goals Patient Stated Goal: to improve balance PT Goal Formulation: With patient Time For Goal Achievement: 07/07/22 Potential to Achieve Goals: Good Progress towards PT goals: Progressing toward goals    Frequency    Min 2X/week      PT Plan Discharge plan needs to be updated    Co-evaluation              AM-PAC PT "6 Clicks" Mobility   Outcome Measure  Help needed turning from your back to your side while in a flat bed without using bedrails?: None Help needed moving from lying on your back to sitting on the side of a flat bed without using bedrails?: None Help needed moving to and from a bed to a chair (including a wheelchair)?: None Help needed standing up from a chair using your arms (e.g., wheelchair or bedside chair)?: A Little Help needed to walk in hospital room?: A Little Help needed climbing 3-5 steps with a railing? : A Little 6 Click Score: 21    End of Session   Activity Tolerance: Patient tolerated treatment well Patient left:  (sitting in day room) Nurse Communication: Mobility status;Precautions;Other (comment) (pt's current cognition concerns) PT Visit Diagnosis: Unsteadiness on feet (R26.81);Muscle weakness (generalized) (M62.81);History of falling (Z91.81)     Time: 0076-2263 PT Time Calculation (min) (ACUTE ONLY): 11 min  Charges:  $Therapeutic Activity: 8-22 mins                    Leitha Bleak, PT 07/01/22, 12:01 PM

## 2022-07-01 NOTE — Progress Notes (Signed)
Patient is alert and oriented times 3. Mood and affect sad/sullen. Patient denies pain. She denies SI, HI, and AVH. Also denies feelings of anxiety and depression at this time. States she slept good last night. Morning meds given whole by mouth W/O difficulty. Patient refused breakfast this morning, stating she isn't hungry.  Patient remains on unit with Q15 minute checks in place

## 2022-07-01 NOTE — Plan of Care (Signed)
1900 - 0700 Nursing Note Post Fall  Pt visible in milieu with visitor in evening, Pt displays intermittent confusion, was calm, cooperative, pleasant, ambulating using walker well. Son and this Probation officer discussed improvement observed during visiting hours.  Pt later remained in milieu, declined snack, was medication compliant.  Pt provided education on Remeron. Pt reading magazines in room sitting in chair. Pt offered bathroom, declined, continued to read then went to bed.  Post Fall: BHT found pt on floor at 0035, this Probation officer responded. Pt stated "I was trying to grab some stuff from the bag and fell." Pt had two bags with belongings/books on chair in room. Pt reports L hip/pelvic region pain 8/10, pt denied pain in any other area of body, pt was unsure if pt hit head. Pt assisted to standing, pt reports pain in same region intensified upon standing and was decreased when pt sat on bed. Pt then stated " I think I only hit my hip and elbow." Pt declined medication at this time. Post fall assessment completed, pt has small skin tear left elbow, other RN placed tegaderm. This Probation officer paged provider on call, awaiting call back. Pt was assisted to a geri-chair for comfort and placed next to nursing station. Pt was more coherent than before the fall, pt read a magazine, was pleasant and joking with staff. Pt continued to deny pain in any other area of body, pt reported when sitting still pain was not "that bad." However, due to pain with movement this writer obtained an order from Dr. Weber Cooks for x-ray of pelvis/bilat hips. Provider Renetta Chalk returned call, ordered a consult with medical hospitalist and a 1:1 sitter for pt. Pt was taken to x-ray by BHT, pt was calm, cooperative per MHT. Upon return pt was assisted to bed, again with movement pt appeared to be in pain with any weight bearing on L leg, although pt minimized pain verbally, rating it 5/10. Pt began displaying moderate confusion, needing to be  re-oriented to reality. Pt c/o anxiety and frantic worry about " my grandson going on a field trip." This Probation officer provided a stress ball and guided deep breathing briefly, the other RN remained with pt this Probation officer gave Tylenol and Ativan PRN, pending effect, pt remains with staff at bedside for safety. Upon reassessment, pt sleeping. Pt's son [name] contacted to inform of fall. Pt's 24 hour sleep total 5.25 hours.   Problem: Coping: Goal: Level of anxiety will decrease Outcome: Not Progressing

## 2022-07-01 NOTE — Progress Notes (Signed)
   07/01/22 0733  Psych Admission Type (Psych Patients Only)  Admission Status Involuntary  Psychosocial Assessment  Patient Complaints None  Eye Contact Fair  Facial Expression Animated  Affect Anxious  Speech Logical/coherent  Interaction Assertive  Motor Activity Unsteady  Appearance/Hygiene Unremarkable  Behavior Characteristics Cooperative  Mood Sullen;Pleasant  Thought Process  Coherency WDL  Content WDL  Delusions None reported or observed  Perception WDL  Hallucination None reported or observed  Judgment Impaired  Confusion Mild  Danger to Self  Current suicidal ideation? Denies  Danger to Others  Danger to Others None reported or observed

## 2022-07-01 NOTE — Plan of Care (Signed)
  Problem: Education: Goal: Knowledge of General Education information will improve Description: Including pain rating scale, medication(s)/side effects and non-pharmacologic comfort measures Outcome: Progressing   Problem: Health Behavior/Discharge Planning: Goal: Ability to manage health-related needs will improve Outcome: Progressing   Problem: Clinical Measurements: Goal: Ability to maintain clinical measurements within normal limits will improve Outcome: Progressing Goal: Will remain free from infection Outcome: Progressing Goal: Diagnostic test results will improve Outcome: Progressing Goal: Respiratory complications will improve Outcome: Progressing Goal: Cardiovascular complication will be avoided Outcome: Progressing   Problem: Activity: Goal: Risk for activity intolerance will decrease Outcome: Progressing   Problem: Nutrition: Goal: Adequate nutrition will be maintained Outcome: Progressing   Problem: Coping: Goal: Level of anxiety will decrease Outcome: Progressing   Problem: Elimination: Goal: Will not experience complications related to bowel motility Outcome: Progressing Goal: Will not experience complications related to urinary retention Outcome: Progressing   Problem: Pain Managment: Goal: General experience of comfort will improve Outcome: Progressing   Problem: Safety: Goal: Ability to remain free from injury will improve Outcome: Progressing   Problem: Skin Integrity: Goal: Risk for impaired skin integrity will decrease Outcome: Progressing   Problem: Education: Goal: Utilization of techniques to improve thought processes will improve Outcome: Progressing Goal: Knowledge of the prescribed therapeutic regimen will improve Outcome: Progressing   Problem: Activity: Goal: Interest or engagement in leisure activities will improve Outcome: Progressing Goal: Imbalance in normal sleep/wake cycle will improve Outcome: Progressing   Problem:  Coping: Goal: Coping ability will improve Outcome: Progressing Goal: Will verbalize feelings Outcome: Progressing   Problem: Health Behavior/Discharge Planning: Goal: Ability to make decisions will improve Outcome: Progressing Goal: Compliance with therapeutic regimen will improve Outcome: Progressing   Problem: Role Relationship: Goal: Will demonstrate positive changes in social behaviors and relationships Outcome: Progressing   Problem: Safety: Goal: Ability to disclose and discuss suicidal ideas will improve Outcome: Progressing Goal: Ability to identify and utilize support systems that promote safety will improve Outcome: Progressing   Problem: Self-Concept: Goal: Will verbalize positive feelings about self Outcome: Progressing Goal: Level of anxiety will decrease Outcome: Progressing

## 2022-07-01 NOTE — Progress Notes (Signed)
[facility] MD Progress Note  07/01/2022 9:41 AM Cathy [name]  MRN:  [id number] Subjective: Cathy [name] is seen on rounds.  She is doing much better.  She is alert and oriented x 3.  She ate breakfast.  She did sleep last night but she did have some awakening episodes and some minor visual hallucinations for which she apologized for calling the nurse about.  She went back to sleep.  She is much more steady on her feet and walking better.  When to have PT come back and see her.  Her one-to-one has been discontinued.  CBC was within normal limits.  UTI has resolved.  CMP shows low albumin, protein, and calcium.  That is to be expected concerning her problems with appetite over the last month.  She was given Ensure yesterday and refused it because she says that she is eating better.  Principal Problem: Major depressive disorder, recurrent episode, severe (HCC) Diagnosis: Principal Problem:   Major depressive disorder, recurrent episode, severe ([place])  Total Time spent with patient: 15 minutes  Past Psychiatric History: She has been on Lexapro and Trintellix,  prescribed by her PCP. Recent trial of Wellbutrin. No past psychiatric hospitalizations and no past suicide attempts.      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   Vision changes     Past Surgical History:  Procedure Laterality Date   CHOLECYSTECTOMY     IR IMAGING GUIDED PORT INSERTION  09/21/2020   LITHOTRIPSY  1984    LYMPH NODE BIOPSY     MASS BIOPSY Left 09/03/2020   Procedure: LEFT NECK JUGULAR NODE;  Surgeon: Rozetta Nunnery, MD;  Location: [facility];  Service: ENT;  Laterality: Left;   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   Procedure: LEFT TOTAL KNEE ARTHROPLASTY;  Surgeon: Gaynelle Arabian, MD;  Location: [facility];  Service: Orthopedics;  Laterality: Left;   Family History:  Family History  Problem Relation Age of Onset   Hypertension Mother    Stroke Mother    Heart attack Mother        CABG, 5 Stents   Dementia Mother    Cancer Father        Kidney   Asthma Sister    Diabetes Brother        Borderline   Alcohol abuse Brother    Cancer Maternal Aunt        stomach ca   Cancer Maternal Aunt        ovarian ca   Breast cancer Cousin    Colon cancer Neg Hx     Social History:  Social History   Substance and Sexual Activity  Alcohol Use No     Social History   Substance and Sexual Activity  Drug Use No    Social History   Socioeconomic History   Marital status: Widowed  Spouse name: Not on file   Number of children: Not on file   Years of education: Not on file   Highest education level: Not on file  Occupational History   Not on file  Tobacco Use   Smoking status: Never   Smokeless tobacco: Never  Substance and Sexual Activity   Alcohol use: No   Drug use: No   Sexual activity: Not Currently    Birth control/protection: Post-menopausal  Other Topics Concern   Not on file  Social History Narrative   Lives alone.     Son is Dr. Margaretmary [name].     Social Determinants of Health   Financial Resource Strain: Not on file  Food Insecurity: No Food Insecurity (06/14/2022)   Hunger Vital Sign    Worried About Running Out of Food in the Last Year: Never true    Ran Out of Food in the Last Year: Never true  Transportation Needs: No Transportation Needs (06/14/2022)   PRAPARE - Hydrologist (Medical): No    Lack of  Transportation (Non-Medical): No  Physical Activity: Not on file  Stress: Not on file  Social Connections: Not on file   Additional Social History:                         Sleep: Good  Appetite:  Fair  Current Medications: Current Facility-Administered Medications  Medication Dose Route Frequency Provider Last Rate Last Admin   acetaminophen (TYLENOL) tablet 650 mg  650 mg Oral Q6H PRN Parks Ranger, DO   650 mg at 06/20/22 2121   alum & mag hydroxide-simeth (MAALOX/MYLANTA) 200-200-20 MG/5ML suspension 30 mL  30 mL Oral Q4H PRN Parks Ranger, DO       aspirin EC tablet 81 mg  81 mg Oral Daily Parks Ranger, DO   81 mg at 07/01/22 0840   atorvastatin (LIPITOR) tablet 20 mg  20 mg Oral Daily Parks Ranger, DO   20 mg at 07/01/22 0840   cholecalciferol (VITAMIN D3) 25 MCG (1000 UNIT) tablet 1,000 Units  1,000 Units Oral Daily Parks Ranger, DO   1,000 Units at 07/01/22 0840   cyanocobalamin (VITAMIN B12) tablet 1,000 mcg  1,000 mcg Oral Daily Parks Ranger, DO   1,000 mcg at 07/01/22 0840   feeding supplement (ENSURE ENLIVE / ENSURE PLUS) liquid 237 mL  237 mL Oral TID BM Parks Ranger, DO   237 mL at 06/30/22 1020   lip balm (BLISTEX) ointment   Topical PRN Parks Ranger, DO       LORazepam (ATIVAN) tablet 0.5 mg  0.5 mg Oral Q6H PRN Parks Ranger, DO   0.5 mg at 06/28/22 2145   losartan (COZAAR) tablet 25 mg  25 mg Oral Daily Parks Ranger, DO   25 mg at 07/01/22 0840   magnesium hydroxide (MILK OF MAGNESIA) suspension 30 mL  30 mL Oral Daily PRN Parks Ranger, DO   30 mL at 06/24/22 1006   modafinil (PROVIGIL) tablet 200 mg  200 mg Oral Daily Parks Ranger, DO   200 mg at 07/01/22 0840   multivitamin with minerals tablet 1 tablet  1 tablet Oral Daily Parks Ranger, DO   1 tablet at 07/01/22 0840   OLANZapine (ZYPREXA) tablet 5 mg  5 mg Oral QHS  Parks Ranger, DO   5 mg at 06/30/22 2113   pantoprazole (Sk[place]ia)  EC tablet 40 mg  40 mg Oral Daily Parks Ranger, DO   40 mg at 07/01/22 0840   polyethylene glycol (MIRALAX / GLYCOLAX) packet 17 g  17 g Oral Daily Parks Ranger, DO   17 g at 07/01/22 0840   traZODone (DESYREL) tablet 25 mg  25 mg Oral QHS PRN Parks Ranger, DO       venlafaxine XR (EFFEXOR-XR) 24 hr capsule 225 mg  225 mg Oral Q breakfast Parks Ranger, DO   225 mg at 07/01/22 0840    Lab Results:  Results for orders placed or performed during the hospital encounter of 06/14/22 (from the past 48 hour(s))  CBC with Differential/Platelet     Status: Abnormal   Collection Time: 06/30/22  7:01 AM  Result Value Ref Range   WBC 6.7 4.0 - 10.5 K/uL   RBC 4.05 3.87 - 5.11 MIL/uL   Hemoglobin 11.9 (L) 12.0 - 15.0 g/dL   HCT 37.0 36.0 - 46.0 %   MCV 91.4 80.0 - 100.0 fL   MCH 29.4 26.0 - 34.0 pg   MCHC 32.2 30.0 - 36.0 g/dL   RDW 13.6 11.5 - 15.5 %   Platelets 242 150 - 400 K/uL   nRBC 0.0 0.0 - 0.2 %   Neutrophils Relative % 69 %   Neutro Abs 4.6 1.7 - 7.7 K/uL   Lymphocytes Relative 16 %   Lymphs Abs 1.1 0.7 - 4.0 K/uL   Monocytes Relative 12 %   Monocytes Absolute 0.8 0.1 - 1.0 K/uL   Eosinophils Relative 3 %   Eosinophils Absolute 0.2 0.0 - 0.5 K/uL   Basophils Relative 0 %   Basophils Absolute 0.0 0.0 - 0.1 K/uL   Immature Granulocytes 0 %   Abs Immature Granulocytes 0.03 0.00 - 0.07 K/uL    Comment: Performed at [facility], [address] [place], [place] [address]  Comprehensive metabolic panel     Status: Abnormal   Collection Time: 06/30/22  7:01 AM  Result Value Ref Range   Sodium 137 135 - 145 mmol/L   Potassium 4.2 3.5 - 5.1 mmol/L   Chloride 105 98 - 111 mmol/L   CO2 26 22 - 32 mmol/L   Glucose, Bld 107 (H) 70 - 99 mg/dL    Comment: Glucose reference range applies only to samples taken after fasting for at least 8 hours.   BUN 22 8 - 23  mg/dL   Creatinine, Ser 0.72 0.44 - 1.00 mg/dL   Calcium 8.2 (L) 8.9 - 10.3 mg/dL   Total Protein 5.7 (L) 6.5 - 8.1 g/dL   Albumin 3.2 (L) 3.5 - 5.0 g/dL   AST 27 15 - 41 U/L   ALT 20 0 - 44 U/L   Alkaline Phosphatase 55 38 - 126 U/L   Total Bilirubin 0.5 0.3 - 1.2 mg/dL   GFR, Estimated >60 >60 mL/min    Comment: (NOTE) Calculated using the CKD-EPI Creatinine Equation (2021)    Anion gap 6 5 - 15    Comment: Performed at [facility], [address], [place], [place] [address]    Blood Alcohol level:  Lab Results  Component Value Date   [facility] <10 85/27/7824    Metabolic Disorder Labs: Lab Results  Component Value Date   HGBA1C 5.5 06/10/2022   MPG 111.15 06/10/2022   MPG 105 04/05/2021   No results found for: "PROLACTIN" Lab Results  Component Value Date   CHOL 246 (H)  06/10/2022   TRIG 94 06/10/2022   HDL 57 06/10/2022   CHOLHDL 4.3 06/10/2022   VLDL 19 06/10/2022   LDLCALC 170 (H) 06/10/2022   LDLCALC 67 04/05/2021    Physical Findings: AIMS:  , ,  ,  ,    CIWA:    COWS:     Musculoskeletal: Strength & Muscle Tone: within normal limits Gait & Station: normal Patient leans: N/A  Psychiatric Specialty Exam:  Presentation  General Appearance:  Appropriate for Environment; Casual  Eye Contact: Good  Speech: Clear and Coherent; Normal Rate  Speech Volume: Normal  Handedness: Right   Mood and Affect  Mood: Dysphoric  Affect: Congruent   Thought Process  Thought Processes: Coherent  Descriptions of Associations:Intact  Orientation:Full (Time, Place and Person)  Thought Content:Logical  History of Schizophrenia/Schizoaffective disorder:No  Duration of Psychotic Symptoms:No data recorded Hallucinations:No data recorded Ideas of Reference:None  Suicidal Thoughts:No data recorded Homicidal Thoughts:No data recorded  Sensorium  Memory: Immediate Good; Recent Good; Remote  Good  Judgment: Good  Insight: Good   Executive Functions  Concentration: Good  Attention Span: Good  Recall: Good  Fund of Knowledge: Good  Language: Good   Psychomotor Activity  Psychomotor Activity:No data recorded  Assets  Assets: Physical Health; Resilience; Social Support; Catering manager; Housing   Sleep  Sleep:No data recorded   Physical Exam: Physical Exam Vitals and nursing note reviewed.  Constitutional:      Appearance: Normal appearance. She is normal weight.  Neurological:     General: No focal deficit present.     Mental Status: She is alert and oriented to person, place, and time.  Psychiatric:        Attention and Perception: Attention and perception normal.        Mood and Affect: Mood is anxious and depressed.        Speech: Speech normal.        Behavior: Behavior normal. Behavior is cooperative.        Thought Content: Thought content normal.        Cognition and Memory: Cognition and memory normal.        Judgment: Judgment normal.    Review of Systems  Constitutional: Negative.   HENT: Negative.    Eyes: Negative.   Respiratory: Negative.    Cardiovascular: Negative.   Gastrointestinal: Negative.   Genitourinary: Negative.   Musculoskeletal: Negative.   Skin: Negative.   Neurological: Negative.   Endo/Heme/Allergies: Negative.   Psychiatric/Behavioral: Negative.     Blood pressure 118/60, pulse 99, temperature 97.8 F (36.6 C), resp. rate 18, height '5\' 2"'$  (1.575 m), SpO2 97 %. Body mass index is 20.01 kg/m.   Treatment Plan Summary: Daily contact with patient to assess and evaluate symptoms and progress in treatment, Medication management, and Plan continue current medications.  Reconsult physical therapy.  Parks Ranger, DO 07/01/2022, 9:41 AM

## 2022-07-01 NOTE — Group Note (Signed)
[facility] LCSW Group Therapy Note   Group Date: 07/01/2022 Start Time: 1300 End Time: 1400   Type of Therapy/Topic:  Group Therapy:  Emotion Regulation  Participation Level:  Did Not Attend   Mood:  Description of Group:    The purpose of this group is to assist patients in learning to regulate negative emotions and experience positive emotions. Patients will be guided to discuss ways in which they have been vulnerable to their negative emotions. These vulnerabilities will be juxtaposed with experiences of positive emotions or situations, and patients challenged to use positive emotions to combat negative ones. Special emphasis will be placed on coping with negative emotions in conflict situations, and patients will process healthy conflict resolution skills.  Therapeutic Goals: Patient will identify two positive emotions or experiences to reflect on in order to balance out negative emotions:  Patient will label two or more emotions that they find the most difficult to experience:  Patient will be able to demonstrate positive conflict resolution skills through discussion or role plays:   Summary of Patient Progress:   X    Therapeutic Modalities:   Cognitive Behavioral Therapy Feelings Identification Dialectical Behavioral Therapy   Lelah Rennaker A [place], LCSWA

## 2022-07-01 NOTE — Progress Notes (Addendum)
Patient more confused than yesterday. She is able to state her name, DOB, and month. She has trouble remembering where is room is and where the dayroom is located. Patient is also having visual hallucinations- states that she can see her brother in her room.  Patient tearful, sad, and anxious. She is refusing to eat dinner and sitting by the phone.

## 2022-07-02 ENCOUNTER — Inpatient Hospital Stay: Payer: Medicare PPO

## 2022-07-02 DIAGNOSIS — S329XXA Fracture of unspecified parts of lumbosacral spine and pelvis, initial encounter for closed fracture: Secondary | ICD-10-CM | POA: Insufficient documentation

## 2022-07-02 DIAGNOSIS — S32592A Other specified fracture of left pubis, initial encounter for closed fracture: Secondary | ICD-10-CM

## 2022-07-02 DIAGNOSIS — F332 Major depressive disorder, recurrent severe without psychotic features: Secondary | ICD-10-CM | POA: Diagnosis not present

## 2022-07-02 MED ORDER — OXYCODONE-ACETAMINOPHEN 7.5-325 MG PO TABS
1.0000 | ORAL_TABLET | Freq: Four times a day (QID) | ORAL | Status: DC | PRN
  Administered 2022-07-02 – 2022-07-19 (×22): 1 via ORAL
  Filled 2022-07-02 (×22): qty 1

## 2022-07-02 MED ORDER — IBUPROFEN 200 MG PO TABS
400.0000 mg | ORAL_TABLET | Freq: Four times a day (QID) | ORAL | Status: DC | PRN
  Administered 2022-07-02 – 2022-07-25 (×13): 400 mg via ORAL
  Filled 2022-07-02 (×13): qty 2

## 2022-07-02 MED ORDER — OXYCODONE HCL 5 MG PO TABS
5.0000 mg | ORAL_TABLET | Freq: Once | ORAL | Status: AC
  Administered 2022-07-02: 5 mg via ORAL
  Filled 2022-07-02: qty 1

## 2022-07-02 NOTE — Consult Note (Signed)
ORTHOPAEDIC CONSULTATION  REQUESTING PHYSICIAN: Parks Ranger,*  Chief Complaint:   Left pubic ramus fracture  History of Present Illness: Cathy [name] is a 77 y.o. female who was admitted to the geriatric psychiatry unit had an unwitnessed fall last night while inpatient sustaining an injury to her left pelvic area.  Per nursing there did not appear to be any head strike or any other sustained injuries to the head or neck at that time.  Patient underwent x-rays which showed minimally displaced pubic ramus fractures and orthopedics was consulted.  At this time the patient is awake and sitting in the day room comfortable and watching TV in no apparent pain.  She was confused but answers questions appropriately.  She reports some pain in her left groin denies any numbness or tingling.  Denies any headache or chest pain at this time.  Past Medical History:  Diagnosis Date   Abnormal glucose    Allergy    Asthma    Allergy induced   Benign breast cyst in female    GERD (gastroesophageal reflux disease)    Hilar lymphadenopathy 08/01/2011   History of nuclear stress test 2009   Treadmill and Stress Myoview- no CAD   Hyperlipidemia    Hypertension    Lymphadenopathy of left cervical region 08/01/2011   Nephrolithiasis 1984   Osteopenia    PONV (postoperative nausea and vomiting)    Post-menopause    Pre-diabetes    borderline   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    Grade 2   Vision changes    Past Surgical History:  Procedure Laterality Date   CHOLECYSTECTOMY     IR IMAGING GUIDED PORT INSERTION  09/21/2020   LITHOTRIPSY  1984   LYMPH NODE BIOPSY     MASS BIOPSY Left 09/03/2020   Procedure: LEFT NECK JUGULAR NODE;  Surgeon: Rozetta Nunnery, MD;  Location: [facility];  Service: ENT;  Laterality: Left;   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   Procedure: LEFT TOTAL KNEE ARTHROPLASTY;   Surgeon: Gaynelle Arabian, MD;  Location: [facility];  Service: Orthopedics;  Laterality: Left;   Social History   Socioeconomic History   Marital status: Widowed    Spouse name: Not on file   Number of children: Not on file   Years of education: Not on file   Highest education level: Not on file  Occupational History   Not on file  Tobacco Use   Smoking status: Never   Smokeless tobacco: Never  Substance and Sexual Activity   Alcohol use: No   Drug use: No   Sexual activity: Not Currently    Birth control/protection: Post-menopausal  Other Topics Concern   Not on file  Social History Narrative   Lives alone.     Son is [name].     Social Determinants of Health   Financial Resource Strain: Not on file  Food Insecurity: No Food Insecurity (06/14/2022)   Hunger Vital Sign    Worried About Running Out of Food in the Last Year: Never true    Ran Out of Food in the Last Year: Never true  Transportation Needs: No Transportation Needs (06/14/2022)   PRAPARE - Hydrologist (Medical): No    Lack of Transportation (Non-Medical): No  Physical Activity: Not on file  Stress: Not on file  Social Connections: Not on file   Family History  Problem Relation Age of Onset   Hypertension Mother  Stroke Mother    Heart attack Mother        CABG, 5 Stents   Dementia Mother    Cancer Father        Kidney   Asthma Sister    Diabetes Brother        Borderline   Alcohol abuse Brother    Cancer Maternal Aunt        stomach ca   Cancer Maternal Aunt        ovarian ca   Breast cancer Cousin    Colon cancer Neg Hx    Allergies  Allergen Reactions   Codeine     Patients states when she was young, she was told she "acted weird" after being given codeine   Prior to Admission medications   Medication Sig Start Date End Date Taking? Authorizing Provider  albuterol (VENTOLIN HFA) 108 (90 Base) MCG/ACT inhaler Inhale 2 puffs into the lungs every 6 (six)  hours as needed for wheezing or shortness of breath. 07/12/21   Eulogio Bear, NP  aspirin EC 81 MG tablet Take 81 mg by mouth daily.    [provider]  atorvastatin (LIPITOR) 20 MG tablet Take 1 tablet (20 mg total) by mouth daily. 07/12/21   Eulogio Bear, NP  cetirizine (ZYRTEC) 10 MG tablet Take 1 tablet (10 mg total) by mouth daily. 07/12/21   Eulogio Bear, NP  cholecalciferol (CHOLECALCIFEROL) 25 MCG tablet Take 1 tablet (1,000 Units total) by mouth daily. 06/14/22   Revonda Humphrey, NP  cyanocobalamin (VITAMIN B12) 1000 MCG tablet Take 1 tablet (1,000 mcg total) by mouth daily. 02/20/22   Tonia Ghent, MD  denosumab (PROLIA) 60 MG/ML SOSY injection Inject 60 mg into the skin every 6 (six) months.    [provider]  estradiol (ESTRACE) 0.1 MG/GM vaginal cream Discard plastic applicator. Insert blueberry size amount of cream on finger in vagina daily x1 week then 2x per week. 10/14/21   [provider]  fluticasone furoate-vilanterol (BREO ELLIPTA) 100-25 MCG/ACT AEPB Inhale 1 puff into the lungs daily. Patient taking differently: Inhale 1 puff into the lungs daily as needed. 07/12/21   Eulogio Bear, NP  glucose blood (ACCU-CHEK AVIVA PLUS) test strip Use to Monitor blood sugars daily. DX. E11.9 12/21/21   Tonia Ghent, MD  hydrochlorothiazide (MICROZIDE) 12.5 MG capsule Take 1 capsule (12.5 mg total) by mouth daily. 07/12/21   Eulogio Bear, NP  hydroxypropyl methylcellulose / hypromellose (ISOPTO TEARS / GONIOVISC) 2.5 % ophthalmic solution Place 2 drops into both eyes 3 (three) times daily as needed for dry eyes.    [provider]  lidocaine-prilocaine (EMLA) cream Apply to affected area once Patient taking differently: 1 Application as needed. Apply to affected area to port as needed 09/15/20   Heath Lark, MD  losartan (COZAAR) 100 MG tablet TAKE 1 TABLET BY MOUTH ONCE A DAY 03/10/22   Tonia Ghent, MD  metoprolol  succinate (TOPROL-XL) 50 MG 24 hr tablet Take 1 tablet (50 mg total) by mouth daily. Take with or immediately following a meal. 07/12/21   Eulogio Bear, NP  Multiple Vitamin (MULTIVITAMIN) capsule Take 1 capsule by mouth daily.    [provider]  omeprazole (PRILOSEC) 20 MG capsule Take 1 capsule (20 mg total) by mouth daily. 07/12/21   Eulogio Bear, NP  venlafaxine XR (EFFEXOR XR) 75 MG 24 hr capsule Take 2 capsules (150 mg total) by mouth daily with breakfast.  Patient taking differently: Take 75 mg by mouth daily with breakfast. 05/24/22   Tonia Ghent, MD   DG HIPS BILAT WITH PELVIS 3-4 VIEWS  Result Date: 07/02/2022 CLINICAL DATA:  Fall, pelvic and bilateral hip pain EXAM: DG HIP (WITH OR WITHOUT PELVIS) 3-4V BILAT COMPARISON:  None Available. FINDINGS: There are acute mildly displaced fractures of the left pubic symphysis and inferior pubic ramus. The pubic symphysis itself does not appear diastatic. No other fracture identified. No dislocation. Sacroiliac and hip joint spaces are preserved. Soft tissues are unremarkable. IMPRESSION: 1. Acute fractures of the left pubic symphysis and inferior pubic ramus. Electronically Signed   By: Fidela Salisbury M.D.   On: 07/02/2022 03:26    Positive ROS: All other systems have been reviewed and were otherwise negative with the exception of those mentioned in the HPI and as above.  Physical Exam: General:  Alert, no acute distress Psychiatric: Pleasantly confused Cardiovascular:  No pedal edema Respiratory:  No wheezing, non-labored breathing GI:  Abdomen is soft and non-tender Skin:  No lesions in the area of chief complaint Neurologic:  Sensation intact distally Lymphatic:  No axillary or cervical lymphadenopathy  Orthopedic Exam:  Left lower extremity Mild tenderness to palpation over the left pubic bone and a small area of ecchymosis over the lateral hip with some tenderness to palpation there as well,  No pain with  logroll or simulated axial load of the left lower extremity Able to dorsiflex and plantarflex the left foot SI LT left foot and toes + Dorsalis pedis pulse  Secondary survey No tenderness to palpation over other bony prominences in the lower extremities or bilateral upper extremities No pain with logroll or simulated axial loading of the right lower extremity All compartments soft No tenderness to palpation over the cervical or thoracic spine, no bony step-off Motor grossly intact throughout, no focal deficits Sensation grossly intact throughout, no focal deficits Good distal pulses and capillary refill on all extremities   X-rays:  X-ray images and report reviewed by myself which show minimally displaced inferior and superior left pubic ramus fractures.  No fractures or dislocations noted about the left hip or right hip.  No pubic symphysis diastases noted.  No obvious posterior ring injuries on these plain films.  Assessment: Left inferior superior pubic ramus fracture  Plan: Patient sustained left inferior and superior pubic ramus fractures with minimal displacement from her fall.  The patient is comfortable at this time with some pain medication on board.  The patient may weight-bear as tolerated on the left lower extremity and encourage ambulation with physical therapy.  No plan for any surgical intervention for these fractures.  Patient will require appropriate pain medication control during therapy.  The patient may follow-up if and when discharged from the facility and the [facility] Ortho department.  Otherwise can repeat x-rays in 1 month to ensure healing.  Given patient has had repeated falls would recommend some additional observation if feasible to help prevent future injuries.  Discussed findings with primary team who are aware of the above plan.    Steffanie Rainwater MD  Beeper #:  [phone]  07/02/2022 10:10 AM

## 2022-07-02 NOTE — Progress Notes (Signed)
1:1 Observation  Patient in bed awake c/o left leg pain. Patient tossing and turning trying to get out of bed stated " I want to go get some Tylenol. Supportive pillow put under the Patient Hip and ice to hip. Patient stated it felt better. 1:1 Observation ongoing. Support and encouragement provided. Will continue to monitor.

## 2022-07-02 NOTE — Progress Notes (Signed)
[facility] MD Progress Note  07/02/2022 9:59 AM Cathy [name]  MRN:  [id number] Subjective: Patient seen and chart reviewed.  77 year old woman with severe major depression who has been in the hospital for quite a while.  Last night sometime between 1 and 2 apparently the patient suffered a fall.  Nursing reports she did not strike her head but appears to of sustained injury to her pelvic or hip area.  X-ray of the pelvis shows to acute pelvic fractures no fracture of the femur or hip.  Patient was awake sitting up in the day room.  Complained of mild to moderate pain which was better in the position in which she was sitting.  Mood wise had no complaints except chronic anxiety.  Patient was smiling and pleasant in her interaction.  Confused which appears to be pretty much her baseline.  Maybe a little bit worse.  Told me that her son was waiting outside the door to do some tree work, that sort of thing. Principal Problem: Major depressive disorder, recurrent episode, severe (HCC) Diagnosis: Principal Problem:   Major depressive disorder, recurrent episode, severe (HCC) Active Problems:   Pelvic fracture ([place])  Total Time spent with patient: 30 minutes  Past Psychiatric History: Patient has a history of recurrent depression with anxiety.  Has had a hospitalization this time for treatment.  We had been considering electroconvulsive therapy which I think is going to have to be deferred for now because of the fracture  Past Medical History:  Past Medical History:  Diagnosis Date   Abnormal glucose    Allergy    Asthma    Allergy induced   Benign breast cyst in female    GERD (gastroesophageal reflux disease)    Hilar lymphadenopathy 08/01/2011   History of nuclear stress test 2009   Treadmill and Stress Myoview- no CAD   Hyperlipidemia    Hypertension    Lymphadenopathy of left cervical region 08/01/2011   Nephrolithiasis 1984   Osteopenia    PONV (postoperative nausea and vomiting)     Post-menopause    Pre-diabetes    borderline   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    Grade 2   Vision changes     Past Surgical History:  Procedure Laterality Date   CHOLECYSTECTOMY     IR IMAGING GUIDED PORT INSERTION  09/21/2020   LITHOTRIPSY  1984   LYMPH NODE BIOPSY     MASS BIOPSY Left 09/03/2020   Procedure: LEFT NECK JUGULAR NODE;  Surgeon: Rozetta Nunnery, MD;  Location: [facility];  Service: ENT;  Laterality: Left;   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   Procedure: LEFT TOTAL KNEE ARTHROPLASTY;  Surgeon: Gaynelle Arabian, MD;  Location: [facility];  Service: Orthopedics;  Laterality: Left;   Family History:  Family History  Problem Relation Age of Onset   Hypertension Mother    Stroke Mother    Heart attack Mother        CABG, 5 Stents   Dementia Mother    Cancer Father        Kidney   Asthma Sister    Diabetes Brother        Borderline   Alcohol abuse Brother    Cancer Maternal Aunt        stomach ca   Cancer Maternal Aunt        ovarian ca   Breast cancer Cousin    Colon cancer Neg Hx    Family Psychiatric  History: See previous  Social History:  Social History   Substance and Sexual Activity  Alcohol Use No     Social History   Substance and Sexual Activity  Drug Use No    Social History   Socioeconomic History   Marital status: Widowed    Spouse name: Not on file   Number of children: Not on file   Years of education: Not on file   Highest education level: Not on file  Occupational History   Not on file  Tobacco Use   Smoking status: Never   Smokeless tobacco: Never  Substance and Sexual Activity   Alcohol use: No   Drug use: No   Sexual activity: Not Currently    Birth control/protection: Post-menopausal  Other Topics Concern   Not on file  Social History Narrative   Lives alone.     Son is [name].     Social Determinants of Health   Financial Resource Strain: Not on file  Food Insecurity: No Food  Insecurity (06/14/2022)   Hunger Vital Sign    Worried About Running Out of Food in the Last Year: Never true    Ran Out of Food in the Last Year: Never true  Transportation Needs: No Transportation Needs (06/14/2022)   PRAPARE - Hydrologist (Medical): No    Lack of Transportation (Non-Medical): No  Physical Activity: Not on file  Stress: Not on file  Social Connections: Not on file   Additional Social History:                         Sleep: Fair  Appetite:  Fair  Current Medications: Current Facility-Administered Medications  Medication Dose Route Frequency Provider Last Rate Last Admin   alum & mag hydroxide-simeth (MAALOX/MYLANTA) 200-200-20 MG/5ML suspension 30 mL  30 mL Oral Q4H PRN Parks Ranger, DO       aspirin EC tablet 81 mg  81 mg Oral Daily Parks Ranger, DO   81 mg at 07/02/22 0836   atorvastatin (LIPITOR) tablet 20 mg  20 mg Oral Daily Parks Ranger, DO   20 mg at 07/02/22 6606   cholecalciferol (VITAMIN D3) 25 MCG (1000 UNIT) tablet 1,000 Units  1,000 Units Oral Daily Parks Ranger, DO   1,000 Units at 07/02/22 3016   cyanocobalamin (VITAMIN B12) tablet 1,000 mcg  1,000 mcg Oral Daily Parks Ranger, DO   1,000 mcg at 07/02/22 0109   feeding supplement (ENSURE ENLIVE / ENSURE PLUS) liquid 237 mL  237 mL Oral TID BM Parks Ranger, DO   237 mL at 07/02/22 0913   ibuprofen (ADVIL) tablet 400 mg  400 mg Oral Q6H PRN Saliyah Gillin, [name], MD       lip balm (BLISTEX) ointment   Topical PRN Parks Ranger, DO       LORazepam (ATIVAN) tablet 0.5 mg  0.5 mg Oral Q6H PRN Parks Ranger, DO   0.5 mg at 07/02/22 0357   losartan (COZAAR) tablet 25 mg  25 mg Oral Daily Parks Ranger, DO   25 mg at 07/02/22 0836   magnesium hydroxide (MILK OF MAGNESIA) suspension 30 mL  30 mL Oral Daily PRN Parks Ranger, DO   30 mL at 06/24/22 1006   mirtazapine (REMERON)  tablet 7.5 mg  7.5 mg Oral QHS Parks Ranger, DO   7.5 mg at 07/01/22 2105   modafinil (PROVIGIL) tablet 200  mg  200 mg Oral Daily Parks Ranger, DO   200 mg at 07/02/22 0086   multivitamin with minerals tablet 1 tablet  1 tablet Oral Daily Parks Ranger, DO   1 tablet at 07/02/22 0836   OLANZapine (ZYPREXA) tablet 5 mg  5 mg Oral QHS Parks Ranger, DO   5 mg at 07/01/22 2105   oxyCODONE-acetaminophen (PERCOCET) 7.5-325 MG per tablet 1 tablet  1 tablet Oral Q6H PRN Shahram Alexopoulos, [name], MD       pantoprazole (PROTONIX) EC tablet 40 mg  40 mg Oral Daily Parks Ranger, DO   40 mg at 07/02/22 7619   polyethylene glycol (MIRALAX / GLYCOLAX) packet 17 g  17 g Oral Daily Parks Ranger, DO   17 g at 07/02/22 5093   traZODone (DESYREL) tablet 25 mg  25 mg Oral QHS PRN Parks Ranger, DO       venlafaxine XR (EFFEXOR-XR) 24 hr capsule 225 mg  225 mg Oral Q breakfast Parks Ranger, DO   225 mg at 07/02/22 2671    Lab Results: No results found for this or any previous visit (from the past 48 hour(s)).  Blood Alcohol level:  Lab Results  Component Value Date   ETH <10 24/58/0998    Metabolic Disorder Labs: Lab Results  Component Value Date   HGBA1C 5.5 06/10/2022   MPG 111.15 06/10/2022   MPG 105 04/05/2021   No results found for: "PROLACTIN" Lab Results  Component Value Date   CHOL 246 (H) 06/10/2022   TRIG 94 06/10/2022   HDL 57 06/10/2022   CHOLHDL 4.3 06/10/2022   VLDL 19 06/10/2022   LDLCALC 170 (H) 06/10/2022   LDLCALC 67 04/05/2021    Physical Findings: AIMS:  , ,  ,  ,    CIWA:    COWS:     Musculoskeletal: Strength & Muscle Tone: decreased Gait & Station: unsteady Patient leans: N/A  Psychiatric Specialty Exam:  Presentation  General Appearance:  Appropriate for Environment; Casual  Eye Contact: Good  Speech: Clear and Coherent; Normal Rate  Speech  Volume: Normal  Handedness: Right   Mood and Affect  Mood: Dysphoric  Affect: Congruent   Thought Process  Thought Processes: Coherent  Descriptions of Associations:Intact  Orientation:Full (Time, Place and Person)  Thought Content:Logical  History of Schizophrenia/Schizoaffective disorder:No  Duration of Psychotic Symptoms:No data recorded Hallucinations:No data recorded Ideas of Reference:None  Suicidal Thoughts:No data recorded Homicidal Thoughts:No data recorded  Sensorium  Memory: Immediate Good; Recent Good; Remote Good  Judgment: Good  Insight: Good   Executive Functions  Concentration: Good  Attention Span: Good  Recall: Good  Fund of Knowledge: Good  Language: Good   Psychomotor Activity  Psychomotor Activity:No data recorded  Assets  Assets: Physical Health; Resilience; Social Support; Catering manager; Housing   Sleep  Sleep:No data recorded   Physical Exam: Physical Exam Vitals and nursing note reviewed.  Constitutional:      Appearance: Normal appearance.  HENT:     Head: Normocephalic and atraumatic.     Mouth/Throat:     Pharynx: Oropharynx is clear.  Eyes:     Pupils: Pupils are equal, round, and reactive to light.  Cardiovascular:     Rate and Rhythm: Normal rate and regular rhythm.  Pulmonary:     Effort: Pulmonary effort is normal.     Breath sounds: Normal breath sounds.  Abdominal:     General: Abdomen is flat.  Palpations: Abdomen is soft.  Musculoskeletal:        General: Normal range of motion.       Legs:  Skin:    General: Skin is warm and dry.  Neurological:     General: No focal deficit present.     Mental Status: She is alert. Mental status is at baseline.  Psychiatric:        Attention and Perception: Attention normal.        Mood and Affect: Mood normal.        Speech: Speech normal.        Behavior: Behavior is cooperative.        Thought Content: Thought content  normal.        Cognition and Memory: Cognition is impaired. Memory is impaired.    Review of Systems  Constitutional: Negative.   HENT: Negative.    Eyes: Negative.   Respiratory: Negative.    Cardiovascular: Negative.   Gastrointestinal: Negative.   Musculoskeletal: Negative.        Pain in her pelvis in which she experiences in her left hip area.  Skin: Negative.   Neurological: Negative.   Psychiatric/Behavioral:  Negative for depression and suicidal ideas. The patient is nervous/anxious and has insomnia.    Blood pressure (!) 122/59, pulse (!) 108, temperature 97.6 F (36.4 C), temperature source Oral, resp. rate 18, height '5\' 2"'$  (1.575 m), SpO2 94 %. Body mass index is 20.01 kg/m.   Treatment Plan Summary: Medication management and Plan patient apparently had been doing a little bit better by everyone's estimation the past 2 days which had resulted in her being taken off of one-to-one.  Got up in the night time and had a fall.  Pelvic fracture now.  Appreciate orthopedic consultation.  My understanding is that the fractures do not require any surgical intervention or casting at this time.  I am changing her pain medicine to 7.5 mg Percocet every 6 hours, eliminating the as needed acetaminophen to avoid excessive Tylenol dosing and replacing it with ibuprofen as needed.  Physical therapy consult placed to assist with ambulation and recovery from the fracture.  I will try to call the family and update them.  No change to psychiatric treatment for now.  Patient is back on one-to-one.  Plan reviewed with nursing.  Alethia Berthold, MD 07/02/2022, 9:59 AM

## 2022-07-02 NOTE — Progress Notes (Signed)
Patient is alert and oriented times 3. Mood and affect anxious. Patient c/o pain to left groin area. One time dose of OXY given per order. She denies SI, HI, and AVH. Also denies feelings of anxiety and depression at this time. States she slept good last night. Morning meds given whole by mouth W/O difficulty. Patient refused breakfast this morning, stating she isn't hungry.  Patient remains on unit with Q15 minute checks in place and 1:1 sitter.  Patient fell last night and has a fractured pelvis. Ortho and psychiatreiist in to see patient. Will continue to monitor for changes.

## 2022-07-02 NOTE — Plan of Care (Signed)
  Problem: Education: Goal: Knowledge of General Education information will improve Description: Including pain rating scale, medication(s)/side effects and non-pharmacologic comfort measures Outcome: Not Progressing   Problem: Health Behavior/Discharge Planning: Goal: Ability to manage health-related needs will improve Outcome: Not Progressing   Problem: Nutrition: Goal: Adequate nutrition will be maintained Outcome: Progressing   Problem: Coping: Goal: Level of anxiety will decrease Outcome: Not Progressing   Problem: Elimination: Goal: Will not experience complications related to bowel motility Outcome: Progressing Goal: Will not experience complications related to urinary retention Outcome: Progressing   Problem: Pain Managment: Goal: General experience of comfort will improve Outcome: Not Progressing   Problem: Self-Concept: Goal: Will verbalize positive feelings about self Outcome: Progressing Goal: Level of anxiety will decrease Outcome: Not Progressing   Problem: Safety: Goal: Ability to disclose and discuss suicidal ideas will improve Outcome: Progressing Goal: Ability to identify and utilize support systems that promote safety will improve Outcome: Progressing   Problem: Role Relationship: Goal: Will demonstrate positive changes in social behaviors and relationships Outcome: Progressing

## 2022-07-02 NOTE — Progress Notes (Signed)
   07/02/22 0830  Psych Admission Type (Psych Patients Only)  Admission Status Involuntary  Psychosocial Assessment  Patient Complaints Anxiety;Depression;Confusion  Eye Contact Fair  Facial Expression Animated  Affect Anxious  Speech Logical/coherent  Interaction Assertive  Motor Activity Unsteady  Appearance/Hygiene Unremarkable  Behavior Characteristics Anxious;Agitated;Fidgety;Impulsive  Mood Anxious  Thought Process  Coherency WDL  Content WDL  Delusions None reported or observed  Perception WDL  Hallucination Visual;Auditory  Judgment Impaired  Confusion Mild  Danger to Self  Current suicidal ideation? Denies  Danger to Others  Danger to Others None reported or observed

## 2022-07-02 NOTE — [facility] Group Notes (Signed)
[place] Group Notes:  (Nursing/MHT/Case Management/Adjunct)  Date:  07/02/2022  Time:  6:59 PM  Type of Therapy:   Cinema Therapy   Participation Level:  Active  Participation Quality:  Appropriate  Affect:  Appropriate  Cognitive:  Alert  Insight:  Appropriate  Engagement in Group:  Engaged  Modes of Intervention:  Socialization  Summary of Progress/Problems:  Cathy [name] l Cherlyn Cushing 07/02/2022, 6:59 PM

## 2022-07-02 NOTE — Plan of Care (Signed)
Pt received sleeping with son visiting. Pt remained asleep until 0200, pt given PRN Ativan and Ibuprofen at that time, effective. Pt up had milk and vanilla wafers. Pt was very confused, pleasant, pt has increased pain with movement/standing. Pt remains with 1:1 at bedside for safety. 24 hour sleep 8.75  Problem: Pain Managment: Goal: General experience of comfort will improve Outcome: Progressing

## 2022-07-02 NOTE — Progress Notes (Signed)
1:1 Observation  Patient observed on 1:1 status post fall in her room. Patient is alert and oriented x 2 reoriented to time has episodes of groaning in pain when she tries to turn on her side. Prn medications for pain and anxiety given see Mar.   Safety checks ongoing no adverse medication effect noted. Support and encouragement provided.

## 2022-07-03 DIAGNOSIS — F332 Major depressive disorder, recurrent severe without psychotic features: Secondary | ICD-10-CM | POA: Diagnosis not present

## 2022-07-03 NOTE — Progress Notes (Signed)
[name] Progress Note  07/03/2022 12:36 PM Cathy [name]  MRN:  [id number] Subjective: Patient seen.  She states that her pain is currently under control and being managed with the provided pain medication.  Does not appear to be over sedated by it.  Patient met with physical therapy and seemed to understand the discussion and was cooperative.  Physical therapy is recommending physical rehab for the patient at discharge.  Mood is stated as being good.  Does not appear to be overly depressed.  Smiling and reactive. Principal Problem: Major depressive disorder, recurrent episode, severe ([place]) Diagnosis: Principal Problem:   Major depressive disorder, recurrent episode, severe ([facility]) Active Problems:   Pelvic fracture ([place])   Inferior pubic ramus fracture, left, closed, initial encounter ([place])  Total Time spent with patient: 30 minutes  Past Psychiatric History: Past history of severe depression  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   Vision changes     Past Surgical History:  Procedure Laterality Date   CHOLECYSTECTOMY     IR IMAGING GUIDED PORT INSERTION  09/21/2020   LITHOTRIPSY  1984   LYMPH NODE BIOPSY     MASS BIOPSY Left 09/03/2020   Procedure: LEFT NECK JUGULAR NODE;  Surgeon: Rozetta Nunnery, MD;  Location: [facility];  Service: ENT;  Laterality: Left;   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   Procedure: LEFT TOTAL KNEE  ARTHROPLASTY;  Surgeon: Gaynelle Arabian, MD;  Location: [facility];  Service: Orthopedics;  Laterality: Left;   Family History:  Family History  Problem Relation Age of Onset   Hypertension Mother    Stroke Mother    Heart attack Mother        CABG, 5 Stents   Dementia Mother    Cancer Father        Kidney   Asthma Sister    Diabetes Brother        Borderline   Alcohol abuse Brother    Cancer Maternal Aunt        stomach ca   Cancer Maternal Aunt        ovarian ca   Breast cancer Cousin    Colon cancer Neg Hx    Family Psychiatric  History: See previous Social History:  Social History   Substance and Sexual Activity  Alcohol Use No     Social History   Substance and Sexual Activity  Drug Use No    Social History   Socioeconomic History   Marital status: Widowed    Spouse name: Not on file   Number of children: Not on file   Years of education: Not on file   Highest education level: Not on file  Occupational History   Not on file  Tobacco Use   Smoking status: Never   Smokeless tobacco: Never  Substance and Sexual Activity  Alcohol use: No   Drug use: No   Sexual activity: Not Currently    Birth control/protection: Post-menopausal  Other Topics Concern   Not on file  Social History Narrative   Lives alone.     Son is [name].     Social Determinants of Health   Financial Resource Strain: Not on file  Food Insecurity: No Food Insecurity (06/14/2022)   Hunger Vital Sign    Worried About Running Out of Food in the Last Year: Never true    Ran Out of Food in the Last Year: Never true  Transportation Needs: No Transportation Needs (06/14/2022)   PRAPARE - Hydrologist (Medical): No    Lack of Transportation (Non-Medical): No  Physical Activity: Not on file  Stress: Not on file  Social Connections: Not on file   Additional Social History:                         Sleep: Fair  Appetite:  Fair  Current  Medications: Current Facility-Administered Medications  Medication Dose Route Frequency Provider Last Rate Last Admin   alum & mag hydroxide-simeth (MAALOX/MYLANTA) 200-200-20 MG/5ML suspension 30 mL  30 mL Oral Q4H PRN Parks Ranger, DO       aspirin EC tablet 81 mg  81 mg Oral Daily Parks Ranger, DO   81 mg at 07/03/22 2694   atorvastatin (LIPITOR) tablet 20 mg  20 mg Oral Daily Parks Ranger, DO   20 mg at 07/03/22 8546   cholecalciferol (VITAMIN D3) 25 MCG (1000 UNIT) tablet 1,000 Units  1,000 Units Oral Daily Parks Ranger, DO   1,000 Units at 07/03/22 2703   cyanocobalamin (VITAMIN B12) tablet 1,000 mcg  1,000 mcg Oral Daily Parks Ranger, DO   1,000 mcg at 07/03/22 5009   feeding supplement (ENSURE ENLIVE / ENSURE PLUS) liquid 237 mL  237 mL Oral TID BM Parks Ranger, DO   237 mL at 07/02/22 1431   ibuprofen (ADVIL) tablet 400 mg  400 mg Oral Q6H PRN Sammye Staff, [name], MD   400 mg at 07/03/22 0114   lip balm (BLISTEX) ointment   Topical PRN Parks Ranger, DO       LORazepam (ATIVAN) tablet 0.5 mg  0.5 mg Oral Q6H PRN Parks Ranger, DO   0.5 mg at 07/03/22 0114   losartan (COZAAR) tablet 25 mg  25 mg Oral Daily Parks Ranger, DO   25 mg at 07/03/22 0853   magnesium hydroxide (MILK OF MAGNESIA) suspension 30 mL  30 mL Oral Daily PRN Parks Ranger, DO   30 mL at 06/24/22 1006   mirtazapine (REMERON) tablet 7.5 mg  7.5 mg Oral QHS Parks Ranger, DO   7.5 mg at 07/01/22 2105   modafinil (PROVIGIL) tablet 200 mg  200 mg Oral Daily Parks Ranger, DO   200 mg at 07/03/22 3818   multivitamin with minerals tablet 1 tablet  1 tablet Oral Daily Parks Ranger, DO   1 tablet at 07/03/22 0852   OLANZapine (ZYPREXA) tablet 5 mg  5 mg Oral QHS Parks Ranger, DO   5 mg at 07/01/22 2105   oxyCODONE-acetaminophen (PERCOCET) 7.5-325 MG per tablet 1 tablet  1 tablet Oral Q6H  PRN Phinehas Grounds, [name], MD   1 tablet at 07/03/22 0913   pantoprazole (PROTONIX) EC tablet 40 mg  40 mg  Oral Daily Parks Ranger, DO   40 mg at 07/03/22 0932   polyethylene glycol (MIRALAX / GLYCOLAX) packet 17 g  17 g Oral Daily Parks Ranger, DO   17 g at 07/03/22 3557   traZODone (DESYREL) tablet 25 mg  25 mg Oral QHS PRN Parks Ranger, DO       venlafaxine XR (EFFEXOR-XR) 24 hr capsule 225 mg  225 mg Oral Q breakfast Parks Ranger, DO   225 mg at 07/03/22 3220    Lab Results: No results found for this or any previous visit (from the past 48 hour(s)).  Blood Alcohol level:  Lab Results  Component Value Date   ETH <10 25/42/7062    Metabolic Disorder Labs: Lab Results  Component Value Date   HGBA1C 5.5 06/10/2022   MPG 111.15 06/10/2022   MPG 105 04/05/2021   No results found for: "PROLACTIN" Lab Results  Component Value Date   CHOL 246 (H) 06/10/2022   TRIG 94 06/10/2022   HDL 57 06/10/2022   CHOLHDL 4.3 06/10/2022   VLDL 19 06/10/2022   LDLCALC 170 (H) 06/10/2022   LDLCALC 67 04/05/2021    Physical Findings: AIMS:  , ,  ,  ,    CIWA:    COWS:     Musculoskeletal: Strength & Muscle Tone: within normal limits Gait & Station: unsteady Patient leans: N/A  Psychiatric Specialty Exam:  Presentation  General Appearance:  Appropriate for Environment; Casual  Eye Contact: Good  Speech: Clear and Coherent; Normal Rate  Speech Volume: Normal  Handedness: Right   Mood and Affect  Mood: Dysphoric  Affect: Congruent   Thought Process  Thought Processes: Coherent  Descriptions of Associations:Intact  Orientation:Full (Time, Place and Person)  Thought Content:Logical  History of Schizophrenia/Schizoaffective disorder:No  Duration of Psychotic Symptoms:No data recorded Hallucinations:No data recorded Ideas of Reference:None  Suicidal Thoughts:No data recorded Homicidal Thoughts:No data  recorded  Sensorium  Memory: Immediate Good; Recent Good; Remote Good  Judgment: Good  Insight: Good   Executive Functions  Concentration: Good  Attention Span: Good  Recall: Good  Fund of Knowledge: Good  Language: Good   Psychomotor Activity  Psychomotor Activity:No data recorded  Assets  Assets: Physical Health; Resilience; Social Support; Catering manager; Housing   Sleep  Sleep:No data recorded   Physical Exam: Physical Exam Vitals and nursing note reviewed.  Constitutional:      Appearance: Normal appearance.  HENT:     Head: Normocephalic and atraumatic.     Mouth/Throat:     Pharynx: Oropharynx is clear.  Eyes:     Pupils: Pupils are equal, round, and reactive to light.  Cardiovascular:     Rate and Rhythm: Normal rate and regular rhythm.  Pulmonary:     Effort: Pulmonary effort is normal.     Breath sounds: Normal breath sounds.  Abdominal:     General: Abdomen is flat.     Palpations: Abdomen is soft.  Musculoskeletal:        General: Normal range of motion.  Skin:    General: Skin is warm and dry.  Neurological:     General: No focal deficit present.     Mental Status: She is alert. Mental status is at baseline.  Psychiatric:        Attention and Perception: Attention normal.        Mood and Affect: Mood normal.        Speech: Speech normal.  Behavior: Behavior is cooperative.        Thought Content: Thought content normal.        Cognition and Memory: Memory is impaired.    Review of Systems  Constitutional: Negative.   HENT: Negative.    Eyes: Negative.   Respiratory: Negative.    Cardiovascular: Negative.   Gastrointestinal: Negative.   Musculoskeletal: Negative.        HPelvic pain in the left hip area  Skin: Negative.   Neurological: Negative.   Psychiatric/Behavioral: Negative.     Blood pressure (!) 114/58, pulse 85, temperature 98.2 F (36.8 C), temperature source Oral, resp. rate 14,  height '5\' 2"'$  (1.575 m), SpO2 96 %. Body mass index is 20.01 kg/m.   Treatment Plan Summary: Plan physical therapy when wants to pass along to the treatment team that the recommendation is for physical rehab at discharge.  Right now the patient's mood and affect appear to be improved and there does not seem to be an indication to pursue the previous concern about ECT.  No change to medicine today.  Alethia Berthold, MD 07/03/2022, 12:36 PM

## 2022-07-03 NOTE — Plan of Care (Signed)
  Problem: Coping: Goal: Level of anxiety will decrease Outcome: Progressing   Problem: Pain Managment: Goal: General experience of comfort will improve Outcome: Not Progressing   Problem: Safety: Goal: Ability to remain free from injury will improve Outcome: Not Progressing

## 2022-07-03 NOTE — [facility] Group Notes (Addendum)
[place] Group Notes:  (Nursing/MHT/Case Management/Adjunct)   Date:  07/03/2022  Time:  11:00 AM   Type of Therapy:   Puzzle activity   Participation Level:  None   Participation Quality:  None   Affect:  Appropriate   Cognitive:  Asleep   Insight:  Asleep  Engagement in Group:  Not engaged

## 2022-07-03 NOTE — Evaluation (Signed)
Physical Therapy Re-Evaluation Patient Details Name: Cathy [name] MRN: [id number] DOB: 1945/10/28 Today's Date: 07/03/2022  History of Present Illness  Pt is a 77 y.o. female presenting "voluntarily to [facility] reporting worsening depressive symptoms and suicidal ideation" 06/10/22; transferred to [facility] 06/14/22.  Unwitnessed fall 06/18/22 hitting head. Pt now with fall 07/01/22 and diagnosed with left inferior superior pubic ramus fracture and is WBAT per ortho consult note on 1/20.   PMH includes htn, vision changes, L TKA, lymphoma, depression, anxiety.   Clinical Impression  Pt was pleasant and motivated to participate during the session and put forth good effort throughout. Pt required min A and heavy cuing for proper sequencing during transfer training with poor to fair carryover.  Pt again required heavy cuing for step-to sequencing pattern for pain control during gait training but was very motivated to practice and was able to walk 3 x 10 feet with seated therapeutic rest breaks between walks and did demonstrate improved carryover of proper sequencing as session progressed.  Pt is at a very high risk for falls and has limited assistance available at home making discharge to her prior living situation unsafe at this time.  Pt will benefit from PT services in a SNF setting upon discharge to safely address deficits listed in patient problem list for decreased caregiver assistance and eventual return to PLOF.          Recommendations for follow up therapy are one component of a multi-disciplinary discharge planning process, led by the attending physician.  Recommendations may be updated based on patient status, additional functional criteria and insurance authorization.  Follow Up Recommendations Skilled nursing-short term rehab (<3 hours/day) Can patient physically be transported by private vehicle: Yes    Assistance Recommended at Discharge Frequent or constant Supervision/Assistance  Patient  can return home with the following  A little help with walking and/or transfers;A little help with bathing/dressing/bathroom;Assistance with cooking/housework;Direct supervision/assist for medications management;Direct supervision/assist for financial management;Assist for transportation;Help with stairs or ramp for entrance    Equipment Recommendations Rolling walker (2 wheels);BSC/3in1  Recommendations for Other Services       Functional Status Assessment Patient has had a recent decline in their functional status and demonstrates the ability to make significant improvements in function in a reasonable and predictable amount of time.     Precautions / Restrictions Precautions Precautions: Fall Restrictions Weight Bearing Restrictions: Yes LLE Weight Bearing: Weight bearing as tolerated      Mobility  Bed Mobility               General bed mobility comments: NT    Transfers Overall transfer level: Needs assistance Equipment used: Rolling walker (2 wheels) Transfers: Sit to/from Stand Sit to Stand: Min assist           General transfer comment: Mod verbal and tactile cues for increased trunk flexion and hand placement with min A to come to full upright standing    Ambulation/Gait Ambulation/Gait assistance: Min guard Gait Distance (Feet): 10 Feet x 3 Assistive device: Rolling walker (2 wheels) Gait Pattern/deviations: Step-to pattern, Decreased stance time - left, Antalgic, Trunk flexed Gait velocity: decreased     General Gait Details: Slow cadence with max multi-modal cues for step-to pattern for L hip/groin pain control with weight bearing  Stairs            Wheelchair Mobility    Modified Rankin (Stroke Patients Only)       Balance Overall balance assessment: Needs assistance, History  of Falls Sitting-balance support: No upper extremity supported, Feet supported Sitting balance-Leahy Scale: Normal     Standing balance support: Bilateral  upper extremity supported, During functional activity, Reliant on assistive device for balance Standing balance-Leahy Scale: Poor                               Pertinent Vitals/Pain Pain Assessment Pain Assessment: 0-10 Pain Score: 5  Pain Location: L hip Pain Descriptors / Indicators: Sore Pain Intervention(s): Repositioned, Premedicated before session, Monitored during session    Home Living Family/patient expects to be discharged to:: Private residence Living Arrangements: Alone Available Help at Discharge: Family;Available PRN/intermittently Type of Home: House Home Access: Stairs to enter Entrance Stairs-Rails: None Entrance Stairs-Number of Steps: 1 small step from main back entrance   Home Layout: One level Home Equipment: Grab bars - tub/shower;Grab bars - toilet Additional Comments: Was a Education officer, 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    Prior Function Prior Level of Function : Independent/Modified Independent;Driving             Mobility Comments: Ind amb without AD, drives ADLs Comments: Ind in all ADLs and IADLs     Hand Dominance        Extremity/Trunk Assessment   Upper Extremity Assessment Upper Extremity Assessment: Overall WFL for tasks assessed    Lower Extremity Assessment Lower Extremity Assessment: Generalized weakness       Communication   Communication: No difficulties  Cognition Arousal/Alertness: Awake/alert Behavior During Therapy: WFL for tasks assessed/performed Overall Cognitive Status: No family/caregiver present to determine baseline cognitive functioning                                          General Comments      Exercises Total Joint Exercises Ankle Circles/Pumps: AROM, Strengthening, Both, 10 reps, 5 reps Quad Sets: Strengthening, Both, 5 reps, 10 reps Gluteal Sets: Strengthening, Both, 5 reps, 10 reps Long Arc Quad: Strengthening, Both, 15 reps, 20 reps Knee Flexion: Strengthening, Both,  15 reps, 20 reps   Assessment/Plan    PT Assessment Patient needs continued PT services  PT Problem List Decreased strength;Decreased balance;Decreased mobility;Decreased knowledge of use of DME;Decreased safety awareness;Decreased activity tolerance       PT Treatment Interventions DME instruction;Gait training;Functional mobility training;Therapeutic activities;Therapeutic exercise;Balance training;Patient/family education;Stair training    PT Goals (Current goals can be found in the Care Plan section)  Acute Rehab PT Goals Patient Stated Goal: Improved balance and to walk without pain PT Goal Formulation: With patient Time For Goal Achievement: 07/16/22 Potential to Achieve Goals: Good    Frequency 7X/week     Co-evaluation               AM-PAC PT "6 Clicks" Mobility  Outcome Measure Help needed turning from your back to your side while in a flat bed without using bedrails?: A Little Help needed moving from lying on your back to sitting on the side of a flat bed without using bedrails?: A Little Help needed moving to and from a bed to a chair (including a wheelchair)?: A Little Help needed standing up from a chair using your arms (e.g., wheelchair or bedside chair)?: A Little Help needed to walk in hospital room?: A Little Help needed climbing 3-5 steps with a railing? : A Lot 6 Click Score: 17  End of Session Equipment Utilized During Treatment: Gait belt Activity Tolerance: Patient tolerated treatment well Patient left: in chair;Other (comment);with nursing/sitter in room (in day room with nursing present) Nurse Communication: Mobility status;Weight bearing status PT Visit Diagnosis: Unsteadiness on feet (R26.81);History of falling (Z91.81);Other abnormalities of gait and mobility (R26.89);Muscle weakness (generalized) (M62.81);Pain Pain - Right/Left: Left Pain - part of body: Hip    Time: 5697-9480 PT Time Calculation (min) (ACUTE ONLY): 29 min   Charges:    PT Evaluation $PT Re-evaluation: 1 Re-eval PT Treatments $Gait Training: 8-22 mins $Therapeutic Exercise: 8-22 mins       D. Royetta Asal PT, DPT 07/03/22, 12:15 PM

## 2022-07-03 NOTE — Progress Notes (Incomplete)
Patient is A+O x2. She denies SI/HI/AVH. Pain 6/10 gen/pelvic area. Adm Percocet at 0913. Upon follow up, pain decreased to 4.

## 2022-07-04 DIAGNOSIS — F332 Major depressive disorder, recurrent severe without psychotic features: Secondary | ICD-10-CM | POA: Diagnosis not present

## 2022-07-04 NOTE — NC FL2 (Cosign Needed Addendum)
[place] LEVEL OF CARE FORM     IDENTIFICATION  Patient Name: Cathy [name] Birthdate: 10-Jun-1946 Sex: female Admission Date (Current Location): 06/14/2022  [facility] and [place] Number:  Engineering geologist and Address:  [facility], [address] [place], [place] [address]      Provider Number: [id number]  Attending Physician Name and Address:  Parks Ranger,*  Relative Name and Phone Number:  Crigger,Warren II(Tom) (Son)  [phone]    Current Level of Care: Hospital Recommended Level of Care: [place] Prior Approval Number:    Date Approved/Denied:  07/05/22 PASRR Number: [id number]  Discharge Plan: SNF    Current Diagnoses: Patient Active Problem List   Diagnosis Date Noted   Pelvic fracture ([place]) 07/02/2022   Inferior pubic ramus fracture, left, closed, initial encounter ([place]) 07/02/2022   Major depressive disorder, recurrent episode, severe (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[place]) 08/25/2020   Osteoporosis 07/16/2019   Chronic left shoulder pain 07/16/2019   Fatty liver 06/19/2018   Pulmonary infiltrate 09/19/2016   Neutrophilia 08/18/2015   Insomnia 03/10/2015   Benign paroxysmal positional vertigo 10/28/2014   OA (osteoarthritis) of knee 09/17/2013   Pre-diabetes 09/17/2013   Peripheral neuropathy 09/17/2013   Asthma    Essential hypertension    GERD (gastroesophageal reflux disease)    Hyperlipidemia    Hilar lymphadenopathy 08/01/2011    Orientation RESPIRATION BLADDER Height &  Weight     Self, Time, Place (situational orientation fluctuates)  Normal Continent Weight:   Height:  '5\' 2"'$  (157.5 cm)  BEHAVIORAL SYMPTOMS/MOOD NEUROLOGICAL BOWEL NUTRITION STATUS  Other (Comment) (N/A)  (N/A) Continent Diet (fluid consistency thin)  AMBULATORY STATUS COMMUNICATION OF NEEDS Skin   Extensive Assist (Patient currently in [place] chair/recliner, too painful to walk. Uses 2 wheel walker and weight bearing as tolerated) Verbally Normal                       Personal Care Assistance Level of Assistance  Dressing, Feeding, Bathing Bathing Assistance: Maximum assistance Feeding assistance: Independent Dressing Assistance: Limited assistance     Functional Limitations Info  Sight, Hearing, Speech Sight Info: Impaired (wears glasses) Hearing Info: Adequate Speech Info: Adequate    SPECIAL CARE FACTORS FREQUENCY  PT (By licensed PT)     PT Frequency: 7x/week              Contractures Contractures Info: Not present    Additional Factors Info  Allergies, Code Status Code Status Info: FULL Allergies Info: Codeine, intolerent, Patients states when she was young, she was told she "acted weird" after being given codeine           Current Medications (07/04/2022):  This is the current hospital active medication list Current Facility-Administered Medications  Medication Dose Route Frequency Provider Last Rate Last Admin   alum & mag hydroxide-simeth (MAALOX/MYLANTA) 200-200-20 MG/5ML suspension 30 mL  30 mL Oral Q4H PRN Parks Ranger, DO       aspirin EC tablet 81 mg  81 mg Oral Daily Parks Ranger, DO   81 mg at  07/04/22 0933   atorvastatin (LIPITOR) tablet 20 mg  20 mg Oral Daily Parks Ranger, DO   20 mg at 07/04/22 1751   cholecalciferol (VITAMIN D3) 25 MCG (1000 UNIT) tablet 1,000 Units  1,000 Units Oral Daily Parks Ranger, DO   1,000 Units at 07/04/22 0258   cyanocobalamin (VITAMIN B12) tablet 1,000 mcg  1,000 mcg  Oral Daily Parks Ranger, DO   1,000 mcg at 07/04/22 5277   feeding supplement (ENSURE ENLIVE / ENSURE PLUS) liquid 237 mL  237 mL Oral TID BM Parks Ranger, DO   237 mL at 07/04/22 8242   ibuprofen (ADVIL) tablet 400 mg  400 mg Oral Q6H PRN [name], [name], MD   400 mg at 07/04/22 1246   lip balm (BLISTEX) ointment   Topical PRN Parks Ranger, DO       LORazepam (ATIVAN) tablet 0.5 mg  0.5 mg Oral Q6H PRN Parks Ranger, DO   0.5 mg at 07/03/22 0114   losartan (COZAAR) tablet 25 mg  25 mg Oral Daily Parks Ranger, DO   25 mg at 07/04/22 3536   magnesium hydroxide (MILK OF MAGNESIA) suspension 30 mL  30 mL Oral Daily PRN Parks Ranger, DO   30 mL at 06/24/22 1006   mirtazapine (REMERON) tablet 7.5 mg  7.5 mg Oral QHS Parks Ranger, DO   7.5 mg at 07/03/22 2100   modafinil (PROVIGIL) tablet 200 mg  200 mg Oral Daily Parks Ranger, DO   200 mg at 07/04/22 1443   multivitamin with minerals tablet 1 tablet  1 tablet Oral Daily Parks Ranger, DO   1 tablet at 07/04/22 0933   OLANZapine (ZYPREXA) tablet 5 mg  5 mg Oral QHS Parks Ranger, DO   5 mg at 07/03/22 2100   oxyCODONE-acetaminophen (PERCOCET) 7.5-325 MG per tablet 1 tablet  1 tablet Oral Q6H PRN [name], [name], MD   1 tablet at 07/04/22 0603   pantoprazole (PROTONIX) EC tablet 40 mg  40 mg Oral Daily Parks Ranger, DO   40 mg at 07/04/22 0933   polyethylene glycol (MIRALAX / GLYCOLAX) packet 17 g  17 g Oral Daily Parks Ranger, DO   17 g at 07/04/22 1540   traZODone (DESYREL) tablet 25 mg  25 mg Oral QHS PRN Parks Ranger, DO       venlafaxine XR (EFFEXOR-XR) 24 hr capsule 225 mg  225 mg Oral Q breakfast Parks Ranger, DO   225 mg at 07/04/22 0867     Discharge Medications: Please see discharge summary for a list of discharge medications.  Relevant Imaging Results:  Relevant Lab Results:   Additional  Information    Jaliah Foody A [place], LCSWA

## 2022-07-04 NOTE — Group Note (Signed)
LCSW Group Therapy Note  Group Date: 07/04/2022 Start Time: 3154 End Time: 1315   Type of Therapy and Topic:  Group Therapy - How To Cope with Nervousness about Discharge   Participation Level:  Active   Description of Group This process group involved identification of patients' feelings about discharge. Some of them are scheduled to be discharged soon, while others are new admissions, but each of them was asked to share thoughts and feelings surrounding discharge from the hospital. One common theme was that they are excited at the prospect of going home, while another was that many of them are apprehensive about sharing why they were hospitalized. Patients were given the opportunity to discuss these feelings with their peers in preparation for discharge.  Therapeutic Goals  Patient will identify their overall feelings about pending discharge. Patient will think about how they might proactively address issues that they believe will once again arise once they get home (i.e. with parents). Patients will participate in discussion about having hope for change.   Summary of Patient Progress:  She was very active throughout the session. Shje demonstrated good insight into the subject matter, and proved open to input from peers and feedback from [place]. She was respectful of peers and participated throughout the entire session. She said she "was on the right track" and feels "frustrated" that she broke her pelvis because she wants to return home. She said that she is coming to terms with the fact that she most likely will being going to a nursing rehab at discharge as opposed to her home. She said "I beat cancer" so she does not feel that she should be depressed or worried because of her current situation, though she recognizes that it is challenging. She said all she wants is "to get her mind right."   Therapeutic Modalities Cognitive Behavioral Therapy   [name], LCSWA 07/04/2022  1:23 PM

## 2022-07-04 NOTE — Plan of Care (Signed)
Pt Aox1, confused, calm, cooperative, pleasant. Pt c/o L hip pain 7/10, Increased pain with movement. Pt asked to shower. PRN medication given for pain before showering pt. Pt is a two assist to transfer from chair to toilet/bed/shower chair. Pt bathed, hair washed. Pt has had a significantly increased appetite x 2 days. Pt had two snacks and an ensure this evening. Due to confusion, pt continues to have poor safety awareness. Sitter at bedside. 24 hour sleep 13 hours.    Problem: Nutrition: Goal: Adequate nutrition will be maintained Outcome: Progressing

## 2022-07-04 NOTE — Progress Notes (Signed)
1:1 Hourly Rounding  0730: Pt in bedroom calm and composed with sitter at side.  0830: Pt in dayroom calm and composed with sitter at side.  0930: Pt in hallway calm and composed with PT.  1030: Pt in dayroom calm and composed with sitter at side.  1130: Pt in dayroom calm and composed with sitter at side.  1230: Pt in dayroom calm and composed with sitter at side.  1330: Pt in dayroom calm and composed with sitter at side.  1430: Pt in bedroom calm and composed with sitter at side.   1530: Pt in hallway calm and composed, wearing gate belt with sitter at side.  1630: Pt in bedroom calm and composed with sitter at side.  1730: Pt in hallway calm and composed, wearing gate belt with sitter at side.  1830: Pt in dayroom calm and composed with sitter at side.  1900: Pt in dayroom visiting with son, calm and composed, sitter at side.

## 2022-07-04 NOTE — Progress Notes (Signed)
[facility] MD Progress Note  07/04/2022 1:34 PM Cathy [name]  MRN:  [id number] Subjective: Cathy [name] is seen on rounds.  Apparently, over the weekend she fell and broke her pelvis but or so saw her and states that she just needs physical therapy.  She has been doing physical therapy.  Her mood is much improved and she is smiling and sleeping well and her appetite is improved.  Mood wise she is doing so much better.  And she does not seem to be in too much pain either.  Obviously, she is going to need constant supervision and rehabilitation.  I am not sure whether that will be a SNF or whether her son wants to do it at home but social work will work on it.  Principal Problem: Major depressive disorder, recurrent episode, severe (HCC) Diagnosis: Principal Problem:   Major depressive disorder, recurrent episode, severe (HCC) Active Problems:   Pelvic fracture (HCC)   Inferior pubic ramus fracture, left, closed, initial encounter ([place])  Total Time spent with patient: 15 minutes  Past Psychiatric History: She has been on Lexapro and Trintellix,  prescribed by her PCP. Recent trial of Wellbutrin. No past psychiatric hospitalizations and no past suicide attempts.     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   Vision changes     Past Surgical History:  Procedure Laterality Date   CHOLECYSTECTOMY     IR IMAGING GUIDED PORT INSERTION  09/21/2020   LITHOTRIPSY  1984   LYMPH  NODE BIOPSY     MASS BIOPSY Left 09/03/2020   Procedure: LEFT NECK JUGULAR NODE;  Surgeon: Rozetta Nunnery, MD;  Location: [facility];  Service: ENT;  Laterality: Left;   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   Procedure: LEFT TOTAL KNEE ARTHROPLASTY;  Surgeon: Gaynelle Arabian, MD;  Location: [facility];  Service: Orthopedics;  Laterality: Left;   Family History:  Family History  Problem Relation Age of Onset   Hypertension Mother    Stroke Mother    Heart attack Mother        CABG, 5 Stents   Dementia Mother    Cancer Father        Kidney   Asthma Sister    Diabetes Brother        Borderline   Alcohol abuse Brother    Cancer Maternal Aunt        stomach ca   Cancer Maternal Aunt        ovarian ca   Breast cancer Cousin    Colon cancer Neg Hx     Social History:  Social History   Substance and Sexual Activity  Alcohol Use No     Social History   Substance and Sexual Activity  Drug Use No    Social History   Socioeconomic History   Marital status: Widowed  Spouse name: Not on file   Number of children: Not on file   Years of education: Not on file   Highest education level: Not on file  Occupational History   Not on file  Tobacco Use   Smoking status: Never   Smokeless tobacco: Never  Substance and Sexual Activity   Alcohol use: No   Drug use: No   Sexual activity: Not Currently    Birth control/protection: Post-menopausal  Other Topics Concern   Not on file  Social History Narrative   Lives alone.     Son is [name].     Social Determinants of Health   Financial Resource Strain: Not on file  Food Insecurity: No Food Insecurity (06/14/2022)   Hunger Vital Sign    Worried About Running Out of Food in the Last Year: Never true    Ran Out of Food in the Last Year: Never true  Transportation Needs: No Transportation Needs (06/14/2022)   PRAPARE - Hydrologist (Medical): No    Lack of Transportation  (Non-Medical): No  Physical Activity: Not on file  Stress: Not on file  Social Connections: Not on file   Additional Social History:                         Sleep: Good  Appetite:  Good  Current Medications: Current Facility-Administered Medications  Medication Dose Route Frequency Provider Last Rate Last Admin   alum & mag hydroxide-simeth (MAALOX/MYLANTA) 200-200-20 MG/5ML suspension 30 mL  30 mL Oral Q4H PRN Parks Ranger, DO       aspirin EC tablet 81 mg  81 mg Oral Daily Parks Ranger, DO   81 mg at 07/04/22 0933   atorvastatin (LIPITOR) tablet 20 mg  20 mg Oral Daily Parks Ranger, DO   20 mg at 07/04/22 6948   cholecalciferol (VITAMIN D3) 25 MCG (1000 UNIT) tablet 1,000 Units  1,000 Units Oral Daily Parks Ranger, DO   1,000 Units at 07/04/22 5462   cyanocobalamin (VITAMIN B12) tablet 1,000 mcg  1,000 mcg Oral Daily Parks Ranger, DO   1,000 mcg at 07/04/22 0933   feeding supplement (ENSURE ENLIVE / ENSURE PLUS) liquid 237 mL  237 mL Oral TID BM Parks Ranger, DO   237 mL at 07/04/22 0934   ibuprofen (ADVIL) tablet 400 mg  400 mg Oral Q6H PRN [name], [name], MD   400 mg at 07/04/22 1246   lip balm (BLISTEX) ointment   Topical PRN Parks Ranger, DO       LORazepam (ATIVAN) tablet 0.5 mg  0.5 mg Oral Q6H PRN Parks Ranger, DO   0.5 mg at 07/03/22 0114   losartan (COZAAR) tablet 25 mg  25 mg Oral Daily Parks Ranger, DO   25 mg at 07/04/22 0933   magnesium hydroxide (MILK OF MAGNESIA) suspension 30 mL  30 mL Oral Daily PRN Parks Ranger, DO   30 mL at 06/24/22 1006   mirtazapine (REMERON) tablet 7.5 mg  7.5 mg Oral QHS Parks Ranger, DO   7.5 mg at 07/03/22 2100   modafinil (PROVIGIL) tablet 200 mg  200 mg Oral Daily Parks Ranger, DO   200 mg at 07/04/22 7035   multivitamin with minerals tablet 1 tablet  1 tablet Oral Daily Parks Ranger, DO   1  tablet at 07/04/22 0933   OLANZapine (ZYPREXA)  tablet 5 mg  5 mg Oral QHS Parks Ranger, DO   5 mg at 07/03/22 2100   oxyCODONE-acetaminophen (PERCOCET) 7.5-325 MG per tablet 1 tablet  1 tablet Oral Q6H PRN [name], [name], MD   1 tablet at 07/04/22 0603   pantoprazole (PROTONIX) EC tablet 40 mg  40 mg Oral Daily Parks Ranger, DO   40 mg at 07/04/22 0933   polyethylene glycol (MIRALAX / GLYCOLAX) packet 17 g  17 g Oral Daily Parks Ranger, DO   17 g at 07/04/22 8338   traZODone (DESYREL) tablet 25 mg  25 mg Oral QHS PRN Parks Ranger, DO       venlafaxine XR (EFFEXOR-XR) 24 hr capsule 225 mg  225 mg Oral Q breakfast Parks Ranger, DO   225 mg at 07/04/22 2505    Lab Results: No results found for this or any previous visit (from the past 48 hour(s)).  Blood Alcohol level:  Lab Results  Component Value Date   ETH <10 39/76/7341    Metabolic Disorder Labs: Lab Results  Component Value Date   HGBA1C 5.5 06/10/2022   MPG 111.15 06/10/2022   MPG 105 04/05/2021   No results found for: "PROLACTIN" Lab Results  Component Value Date   CHOL 246 (H) 06/10/2022   TRIG 94 06/10/2022   HDL 57 06/10/2022   CHOLHDL 4.3 06/10/2022   VLDL 19 06/10/2022   LDLCALC 170 (H) 06/10/2022   LDLCALC 67 04/05/2021    Physical Findings: AIMS:  , ,  ,  ,    CIWA:    COWS:     Musculoskeletal: Strength & Muscle Tone: within normal limits Gait & Station: unsteady Patient leans: N/A  Psychiatric Specialty Exam:  Presentation  General Appearance:  Appropriate for Environment; Casual  Eye Contact: Good  Speech: Clear and Coherent; Normal Rate  Speech Volume: Normal  Handedness: Right   Mood and Affect  Mood: Dysphoric  Affect: Congruent   Thought Process  Thought Processes: Coherent  Descriptions of Associations:Intact  Orientation:Full (Time, Place and Person)  Thought Content:Logical  History of  Schizophrenia/Schizoaffective disorder:No  Duration of Psychotic Symptoms:No data recorded Hallucinations:No data recorded Ideas of Reference:None  Suicidal Thoughts:No data recorded Homicidal Thoughts:No data recorded  Sensorium  Memory: Immediate Good; Recent Good; Remote Good  Judgment: Good  Insight: Good   Executive Functions  Concentration: Good  Attention Span: Good  Recall: Good  Fund of Knowledge: Good  Language: Good   Psychomotor Activity  Psychomotor Activity:No data recorded  Assets  Assets: Physical Health; Resilience; Social Support; Catering manager; Housing   Sleep  Sleep:No data recorded   Physical Exam: Physical Exam Vitals and nursing note reviewed.  Constitutional:      Appearance: Normal appearance. She is normal weight.  Neurological:     General: No focal deficit present.     Mental Status: She is alert and oriented to person, place, and time.  Psychiatric:        Attention and Perception: Attention and perception normal.        Mood and Affect: Mood and affect normal.        Speech: Speech normal.        Behavior: Behavior normal. Behavior is cooperative.        Thought Content: Thought content normal.        Cognition and Memory: Cognition normal.    Review of Systems  Constitutional: Negative.   HENT: Negative.    Eyes:  Negative.   Respiratory: Negative.    Cardiovascular: Negative.   Gastrointestinal: Negative.   Genitourinary: Negative.   Musculoskeletal: Negative.   Skin: Negative.   Neurological: Negative.   Endo/Heme/Allergies: Negative.   Psychiatric/Behavioral: Negative.     Blood pressure (!) 118/54, pulse (!) 102, temperature 98.3 F (36.8 C), temperature source Oral, resp. rate 16, height '5\' 2"'$  (1.575 m), SpO2 96 %. Body mass index is 20.01 kg/m.   Treatment Plan Summary: Daily contact with patient to assess and evaluate symptoms and progress in treatment, Medication management, and  Plan continue current medications.  Continue physical therapy.  Parks Ranger, DO 07/04/2022, 1:34 PM

## 2022-07-04 NOTE — Progress Notes (Signed)
Physical Therapy Treatment Patient Details Name: Cathy [name] MRN: [id number] DOB: [birth year] Today's Date: 07/04/2022   History of Present Illness Pt is a 77 y.o. female presenting "voluntarily to [facility] reporting worsening depressive symptoms and suicidal ideation" 06/10/22; transferred to [facility] 06/14/22.  Unwitnessed fall 06/18/22 hitting head. Pt now with fall 07/01/22 and diagnosed with left inferior superior pubic ramus fracture and is WBAT per ortho consult note on 1/20.   PMH includes htn, vision changes, L TKA, lymphoma, depression, anxiety.    PT Comments    Pt was pleasant and motivated to participate during the session and put forth good effort throughout. Pt required cuing for sequencing with transfers but no physical assistance needed to come to standing this session.  Pt was able to amb 2 x 20 feet with antalgic step-to pattern and cues for sequencing but no overt LOB noted.  During standing balance training with reaching outside BOS pt required min A to prevent LOB frequently most notably with feet together and in semi-tandem.  Pt remains at an elevated risk for falling and will benefit from PT services in a SNF setting upon discharge to safely address deficits listed in patient problem list for decreased caregiver assistance and eventual return to PLOF.     Recommendations for follow up therapy are one component of a multi-disciplinary discharge planning process, led by the attending physician.  Recommendations may be updated based on patient status, additional functional criteria and insurance authorization.  Follow Up Recommendations  Skilled nursing-short term rehab (<3 hours/day) Can patient physically be transported by private vehicle: Yes   Assistance Recommended at Discharge Frequent or constant Supervision/Assistance  Patient can return home with the following A little help with walking and/or transfers;A little help with bathing/dressing/bathroom;Assistance with  cooking/housework;Direct supervision/assist for medications management;Direct supervision/assist for financial management;Assist for transportation;Help with stairs or ramp for entrance   Equipment Recommendations  Rolling walker (2 wheels);BSC/3in1 (TBD at next venue of care if discharges to SNF)    Recommendations for Other Services       Precautions / Restrictions Precautions Precautions: Fall Restrictions Weight Bearing Restrictions: Yes LLE Weight Bearing: Weight bearing as tolerated     Mobility  Bed Mobility               General bed mobility comments: NT, pt in recliner    Transfers Overall transfer level: Needs assistance Equipment used: Rolling walker (2 wheels) Transfers: Sit to/from Stand Sit to Stand: Min guard           General transfer comment: min verbal cues for increased trunk flexion and hand placement and for increased use of LLE to assist in coming to stand    Ambulation/Gait Ambulation/Gait assistance: Min guard Gait Distance (Feet): 20 Feet x 2 Assistive device: Rolling walker (2 wheels) Gait Pattern/deviations: Step-to pattern, Decreased stance time - left, Antalgic, Trunk flexed Gait velocity: decreased     General Gait Details: Slow cadence with mod multi-modal cues for step-to pattern for L hip pain control with weight bearing but no overt LOB this session   Stairs             Wheelchair Mobility    Modified Rankin (Stroke Patients Only)       Balance Overall balance assessment: Needs assistance, History of Falls   Sitting balance-Leahy Scale: Normal     Standing balance support: Bilateral upper extremity supported, During functional activity, Reliant on assistive device for balance, No upper extremity supported Standing balance-Leahy Scale: Poor  Standing balance comment: Frequent min A to prevent LOB during unsupported standing balance training                            Cognition Arousal/Alertness:  Awake/alert Behavior During Therapy: WFL for tasks assessed/performed Overall Cognitive Status: Within Functional Limits for tasks assessed                                          Exercises Total Joint Exercises Ankle Circles/Pumps: AROM, Strengthening, Both, 10 reps Quad Sets: Strengthening, Both, 10 reps Gluteal Sets: Strengthening, Both, 10 reps Long Arc Quad: Strengthening, Both, 10 reps (with gentle manual resistance) Knee Flexion: Strengthening, Both, 10 reps (with gentle manual resistance) Other Exercises Other Exercises: Dynamic unsupported standing balance training with reaching outside BOS with feet apart, together, and semi-tandem    General Comments        Pertinent Vitals/Pain Pain Assessment Pain Assessment: 0-10 Pain Score: 7  Pain Location: L hip Pain Descriptors / Indicators: Sore Pain Intervention(s): Repositioned, Monitored during session, Premedicated before session    Home Living                          Prior Function            PT Goals (current goals can now be found in the care plan section) Progress towards PT goals: Progressing toward goals    Frequency    7X/week      PT Plan Current plan remains appropriate    Co-evaluation              AM-PAC PT "6 Clicks" Mobility   Outcome Measure  Help needed turning from your back to your side while in a flat bed without using bedrails?: A Little Help needed moving from lying on your back to sitting on the side of a flat bed without using bedrails?: A Little Help needed moving to and from a bed to a chair (including a wheelchair)?: A Little Help needed standing up from a chair using your arms (e.g., wheelchair or bedside chair)?: A Little Help needed to walk in hospital room?: A Little Help needed climbing 3-5 steps with a railing? : A Lot 6 Click Score: 17    End of Session Equipment Utilized During Treatment: Gait belt Activity Tolerance: Patient  tolerated treatment well Patient left: in chair;Other (comment);with nursing/sitter in room (Pt left in day room with nsg present) Nurse Communication: Mobility status;Weight bearing status PT Visit Diagnosis: Unsteadiness on feet (R26.81);History of falling (Z91.81);Other abnormalities of gait and mobility (R26.89);Muscle weakness (generalized) (M62.81);Pain Pain - Right/Left: Left Pain - part of body: Hip     Time: 7510-2585 PT Time Calculation (min) (ACUTE ONLY): 24 min  Charges:  $Gait Training: 8-22 mins $Therapeutic Exercise: 8-22 mins                     D. Scott Shelly Shoultz PT, DPT 07/04/22, 11:21 AM

## 2022-07-04 NOTE — Plan of Care (Signed)
Pt denies anxiety/depression at this time. Pt denies SI/HI/AVH however, reports 2/10 pain at this time. Prn medication given and found helpful. Pt is calm and cooperative. Pt is medication compliant. Pt provided with support and encouragement. Pt monitored q15 minutes for safety per unit policy. Plan of care ongoing.   Problem: Nutrition: Goal: Adequate nutrition will be maintained Outcome: Progressing   Problem: Education: Goal: Knowledge of General Education information will improve Description: Including pain rating scale, medication(s)/side effects and non-pharmacologic comfort measures Outcome: Not Progressing

## 2022-07-05 ENCOUNTER — Inpatient Hospital Stay: Payer: Medicare PPO | Attending: Hematology and Oncology

## 2022-07-05 DIAGNOSIS — F332 Major depressive disorder, recurrent severe without psychotic features: Secondary | ICD-10-CM | POA: Diagnosis not present

## 2022-07-05 NOTE — Progress Notes (Signed)
1:1 Hourly Rounding  0730: Pt in bedroom sleeping, raise and fall of chx observed, breathing is unlabored, sitter at side.   0830: Pt in dayroom calm and composed with sitter at side.   0930: Pt in bedroom sleeping, raise and fall of chx observed, breathing is unlabored, sitter at side.  1030: Pt in bedroom sleeping, raise and fall of chx observed, breathing is unlabored, sitter at side.  1130: Pt in dayroom calm and composed with sitter at side.   1230: Pt in dayroom calm and composed with sitter at side.  1330: Pt in dayroom calm and composed with sitter at side.  1430: Pt in bedroom calm and composed with sitter at side.   1530: Pt in bedroom calm and composed with sitter at side.  1630: Pt in dayroom calm and composed with sitter at side.   1730: Pt in dayroom calm and composed with sitter at side.   1830: Pt in bedroom calm and composed with sitter at side.   1900: Pt in the dayroom with visitor calm and composed sitter is present.

## 2022-07-05 NOTE — Plan of Care (Signed)
Pt calm, cooperative, pleasant, intermittent confusion and anxiety. L hip pain that increases with movement, PRN medications effective. Pt had good appetite this shift, drank ensure and had snack. Repositioned in bed for comfort, 1:1 at bedside for safety. 24h sleep 10 hours.     Problem: Activity: Goal: Risk for activity intolerance will decrease Outcome: Progressing   Problem: Nutrition: Goal: Adequate nutrition will be maintained Outcome: Progressing

## 2022-07-05 NOTE — Progress Notes (Signed)
   07/05/22 2300  Psych Admission Type (Psych Patients Only)  Admission Status Involuntary  Psychosocial Assessment  Patient Complaints None  Eye Contact Fair  Facial Expression Other (Comment) (appropriate for situation)  Affect Appropriate to circumstance  Speech Soft  Interaction Assertive  Motor Activity Unsteady;Slow  Appearance/Hygiene Unremarkable  Behavior Characteristics Cooperative;Appropriate to situation;Calm  Mood Pleasant  Thought Process  Coherency Disorganized;Flight of ideas  Content Preoccupation  Delusions None reported or observed  Perception WDL  Hallucination None reported or observed  Judgment Impaired  Confusion Moderate  Danger to Self  Current suicidal ideation? Denies  Agreement Not to Harm Self Yes  Description of Agreement verbal  Danger to Others  Danger to Others None reported or observed

## 2022-07-05 NOTE — Plan of Care (Signed)
Pt denies anxiety/depression at this time. Pt denies SI/HI/AVH or pain at this time. Pt is calm and cooperative. Pt is medication compliant. Pt provided with support and encouragement. Pt monitored q15 minutes for safety per unit policy. Plan of care ongoing.   Problem: Activity: Goal: Risk for activity intolerance will decrease Outcome: Progressing   Problem: Nutrition: Goal: Adequate nutrition will be maintained Outcome: Progressing

## 2022-07-05 NOTE — Group Note (Signed)
LCSW Group Therapy Note   Group Date: 07/05/2022 Start Time: 1300 End Time: 1330   Type of Therapy and Topic:  Group Therapy: Boundaries  Participation Level:  Minimal  Description of Group: This group will address the use of boundaries in their personal lives. Patients will explore why boundaries are important, the difference between healthy and unhealthy boundaries, and negative and postive outcomes of different boundaries and will look at how boundaries can be crossed.  Patients will be encouraged to identify current boundaries in their own lives and identify what kind of boundary is being set. Facilitators will guide patients in utilizing problem-solving interventions to address and correct types boundaries being used and to address when no boundary is being used. Understanding and applying boundaries will be explored and addressed for obtaining and maintaining a balanced life. Patients will be encouraged to explore ways to assertively make their boundaries and needs known to significant others in their lives, using other group members and facilitator for role play, support, and feedback.  Therapeutic Goals:  1.  Patient will identify areas in their life where setting clear boundaries could be  used to improve their life.  2.  Patient will identify signs/triggers that a boundary is not being respected. 3.  Patient will identify two ways to set boundaries in order to achieve balance in  their lives: 4.  Patient will demonstrate ability to communicate their needs and set boundaries  through discussion and/or role plays  Summary of Patient Progress:  She was minimally present/active throughout the session and proved open to feedback from [place] and peers. She was low mood and appeared tired. Patient demonstrated fair insight into the subject matter, was respectful of peers, and was not present throughout the entire session. Patient was tired and left early to lay down.   Therapeutic Modalities:    Cognitive Behavioral Therapy Solution-Focused Therapy  [name] A [place], LCSWA 07/05/2022  1:40 PM

## 2022-07-05 NOTE — Progress Notes (Signed)
Safety round complete. Patient located in bedroom, asleep, lying supine position. Q15 mins checks will be continued.   

## 2022-07-05 NOTE — Progress Notes (Signed)
[facility] MD Progress Note  07/05/2022 12:13 PM Cathy [name]  MRN:  [id number] Subjective: Cathy [name] is seen on rounds.  She is doing better overall.  She is in a little bit of pain this morning and got some Percocet.  Nurses tell me that she was up and ate and smiling this morning.  Social work is referred her to a SNF which I think is appropriate because she does need constant supervision and assistance.  She has been compliant with medications and no side effects.  Her depression is better.  Principal Problem: Major depressive disorder, recurrent episode, severe (HCC) Diagnosis: Principal Problem:   Major depressive disorder, recurrent episode, severe (HCC) Active Problems:   Pelvic fracture (HCC)   Inferior pubic ramus fracture, left, closed, initial encounter ([place])  Total Time spent with patient: 15 minutes  Past Psychiatric History: She has been on Lexapro and Trintellix,  prescribed by her PCP. Recent trial of Wellbutrin. No past psychiatric hospitalizations and no past suicide attempts.     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   Vision changes     Past Surgical History:  Procedure Laterality Date   CHOLECYSTECTOMY     IR IMAGING GUIDED PORT INSERTION  09/21/2020   LITHOTRIPSY  1984   LYMPH NODE BIOPSY     MASS BIOPSY Left 09/03/2020   Procedure: LEFT NECK JUGULAR NODE;  Surgeon: Rozetta Nunnery, MD;  Location: [facility];   Service: ENT;  Laterality: Left;   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   Procedure: LEFT TOTAL KNEE ARTHROPLASTY;  Surgeon: Gaynelle Arabian, MD;  Location: [facility];  Service: Orthopedics;  Laterality: Left;   Family History:  Family History  Problem Relation Age of Onset   Hypertension Mother    Stroke Mother    Heart attack Mother        CABG, 5 Stents   Dementia Mother    Cancer Father        Kidney   Asthma Sister    Diabetes Brother        Borderline   Alcohol abuse Brother    Cancer Maternal Aunt        stomach ca   Cancer Maternal Aunt        ovarian ca   Breast cancer Cousin    Colon cancer Neg Hx     Social History:  Social History   Substance and Sexual Activity  Alcohol Use No     Social History   Substance and Sexual Activity  Drug Use No    Social History   Socioeconomic History   Marital status: Widowed    Spouse name: Not on file   Number of children: Not on file   Years of education: Not on file   Highest education level: Not on file  Occupational History   Not on  file  Tobacco Use   Smoking status: Never   Smokeless tobacco: Never  Substance and Sexual Activity   Alcohol use: No   Drug use: No   Sexual activity: Not Currently    Birth control/protection: Post-menopausal  Other Topics Concern   Not on file  Social History Narrative   Lives alone.     Son is [name].     Social Determinants of Health   Financial Resource Strain: Not on file  Food Insecurity: No Food Insecurity (06/14/2022)   Hunger Vital Sign    Worried About Running Out of Food in the Last Year: Never true    Ran Out of Food in the Last Year: Never true  Transportation Needs: No Transportation Needs (06/14/2022)   PRAPARE - Hydrologist (Medical): No    Lack of Transportation (Non-Medical): No  Physical Activity: Not on file  Stress: Not on file  Social Connections: Not on file   Additional Social History:                          Sleep: Good  Appetite:  Good  Current Medications: Current Facility-Administered Medications  Medication Dose Route Frequency Provider Last Rate Last Admin   alum & mag hydroxide-simeth (MAALOX/MYLANTA) 200-200-20 MG/5ML suspension 30 mL  30 mL Oral Q4H PRN Parks Ranger, DO       aspirin EC tablet 81 mg  81 mg Oral Daily Parks Ranger, DO   81 mg at 07/05/22 1048   atorvastatin (LIPITOR) tablet 20 mg  20 mg Oral Daily Parks Ranger, DO   20 mg at 07/05/22 1049   cholecalciferol (VITAMIN D3) 25 MCG (1000 UNIT) tablet 1,000 Units  1,000 Units Oral Daily Parks Ranger, DO   1,000 Units at 07/05/22 1049   cyanocobalamin (VITAMIN B12) tablet 1,000 mcg  1,000 mcg Oral Daily Parks Ranger, DO   1,000 mcg at 07/05/22 1049   feeding supplement (ENSURE ENLIVE / ENSURE PLUS) liquid 237 mL  237 mL Oral TID BM Parks Ranger, DO   237 mL at 07/05/22 1049   ibuprofen (ADVIL) tablet 400 mg  400 mg Oral Q6H PRN [name], [name], MD   400 mg at 07/05/22 0249   lip balm (BLISTEX) ointment   Topical PRN Parks Ranger, DO       LORazepam (ATIVAN) tablet 0.5 mg  0.5 mg Oral Q6H PRN Parks Ranger, DO   0.5 mg at 07/05/22 0249   losartan (COZAAR) tablet 25 mg  25 mg Oral Daily Parks Ranger, DO   25 mg at 07/05/22 1049   magnesium hydroxide (MILK OF MAGNESIA) suspension 30 mL  30 mL Oral Daily PRN Parks Ranger, DO   30 mL at 06/24/22 1006   mirtazapine (REMERON) tablet 7.5 mg  7.5 mg Oral QHS Parks Ranger, DO   7.5 mg at 07/04/22 2106   modafinil (PROVIGIL) tablet 200 mg  200 mg Oral Daily Parks Ranger, DO   200 mg at 07/05/22 1049   multivitamin with minerals tablet 1 tablet  1 tablet Oral Daily Parks Ranger, DO   1 tablet at 07/05/22 1048   OLANZapine (ZYPREXA) tablet 5 mg  5 mg Oral QHS Parks Ranger, DO   5 mg at 07/04/22 2106   oxyCODONE-acetaminophen  (PERCOCET) 7.5-325 MG per tablet 1 tablet  1 tablet Oral Q6H PRN [name],  [name], MD   1 tablet at 07/05/22 1115   pantoprazole (PROTONIX) EC tablet 40 mg  40 mg Oral Daily Parks Ranger, DO   40 mg at 07/05/22 1049   polyethylene glycol (MIRALAX / GLYCOLAX) packet 17 g  17 g Oral Daily Parks Ranger, DO   17 g at 07/05/22 1048   traZODone (DESYREL) tablet 25 mg  25 mg Oral QHS PRN Parks Ranger, DO       venlafaxine XR (EFFEXOR-XR) 24 hr capsule 225 mg  225 mg Oral Q breakfast Parks Ranger, DO   225 mg at 07/05/22 1049    Lab Results: No results found for this or any previous visit (from the past 48 hour(s)).  Blood Alcohol level:  Lab Results  Component Value Date   ETH <10 95/62/1308    Metabolic Disorder Labs: Lab Results  Component Value Date   HGBA1C 5.5 06/10/2022   MPG 111.15 06/10/2022   MPG 105 04/05/2021   No results found for: "PROLACTIN" Lab Results  Component Value Date   CHOL 246 (H) 06/10/2022   TRIG 94 06/10/2022   HDL 57 06/10/2022   CHOLHDL 4.3 06/10/2022   VLDL 19 06/10/2022   LDLCALC 170 (H) 06/10/2022   LDLCALC 67 04/05/2021    Physical Findings: AIMS:  , ,  ,  ,    CIWA:    COWS:     Musculoskeletal: Strength & Muscle Tone: within normal limits Gait & Station: unsteady Patient leans: N/A  Psychiatric Specialty Exam:  Presentation  General Appearance:  Appropriate for Environment; Casual  Eye Contact: Good  Speech: Clear and Coherent; Normal Rate  Speech Volume: Normal  Handedness: Right   Mood and Affect  Mood: Dysphoric  Affect: Congruent   Thought Process  Thought Processes: Coherent  Descriptions of Associations:Intact  Orientation:Full (Time, Place and Person)  Thought Content:Logical  History of Schizophrenia/Schizoaffective disorder:No  Duration of Psychotic Symptoms:No data recorded Hallucinations:No data recorded Ideas of Reference:None  Suicidal Thoughts:No  data recorded Homicidal Thoughts:No data recorded  Sensorium  Memory: Immediate Good; Recent Good; Remote Good  Judgment: Good  Insight: Good   Executive Functions  Concentration: Good  Attention Span: Good  Recall: Good  Fund of Knowledge: Good  Language: Good   Psychomotor Activity  Psychomotor Activity:No data recorded  Assets  Assets: Physical Health; Resilience; Social Support; Catering manager; Housing   Sleep  Sleep:No data recorded   Physical Exam: Physical Exam Vitals and nursing note reviewed.  Constitutional:      Appearance: Normal appearance. She is normal weight.  Neurological:     General: No focal deficit present.     Mental Status: She is alert and oriented to person, place, and time.  Psychiatric:        Attention and Perception: Attention and perception normal.        Mood and Affect: Mood and affect normal.        Speech: Speech normal.        Behavior: Behavior normal. Behavior is cooperative.        Thought Content: Thought content normal.        Cognition and Memory: Cognition and memory normal.        Judgment: Judgment normal.    Review of Systems  Constitutional: Negative.   HENT: Negative.    Eyes: Negative.   Respiratory: Negative.    Cardiovascular: Negative.   Gastrointestinal: Negative.   Genitourinary: Negative.   Musculoskeletal: Negative.  Skin: Negative.   Neurological: Negative.   Endo/Heme/Allergies: Negative.   Psychiatric/Behavioral: Negative.     Blood pressure 123/64, pulse 100, temperature 97.9 F (36.6 C), resp. rate 14, height '5\' 2"'$  (1.575 m), SpO2 99 %. Body mass index is 20.01 kg/m.   Treatment Plan Summary: Daily contact with patient to assess and evaluate symptoms and progress in treatment, Medication management, and Plan continue current medications.  Parks Ranger, DO 07/05/2022, 12:13 PM

## 2022-07-05 NOTE — [facility] Counselor (Signed)
To whom it may concern: Please be advised that the above named patient will require short term nursing home stay-anticipated 30 days or less for rehabilitation and strengthening. The plan is for return home.   Cathy [name], MSW, LCSW-A 1/23/202412:09 PM

## 2022-07-05 NOTE — Progress Notes (Signed)
Physical Therapy Treatment Patient Details Name: Cathy [name] MRN: [id number] DOB: 03/28/1946 Today's Date: 07/05/2022   History of Present Illness Pt is a 77 y.o. female presenting "voluntarily to [facility] reporting worsening depressive symptoms and suicidal ideation" 06/10/22; transferred to [facility] 06/14/22.  Unwitnessed fall 06/18/22 hitting head. Pt now with fall 07/01/22 and diagnosed with left inferior superior pubic ramus fracture and is WBAT per ortho consult note on 1/20.   PMH includes htn, vision changes, L TKA, lymphoma, depression, anxiety.    PT Comments    Pt was supine in bed upon arriving. She is alert and oriented x 2. Cooperative and pleasant throughout session. Does continue to require assistance to safely exit bed. She was aware of pelvic fx. She required vcs for improved transfer technique however was able to stand to RW and ambulate to BR. Successfully urinated prior to standing and ambulating to community room. Pt does have antalgic gait however no LOB noted with use of RW. SNF remains appropriate however pt is progressing quickly. Will require 24/7 supervision due to cognition and high likelihood of falls. Acute PT will continue to follow per current POC.     Recommendations for follow up therapy are one component of a multi-disciplinary discharge planning process, led by the attending physician.  Recommendations may be updated based on patient status, additional functional criteria and insurance authorization.  Follow Up Recommendations  Skilled nursing-short term rehab (<3 hours/day) (May progress to home with HHPT if available assistance is confirmed. will need 24/7 supervision due to high fall risk. Currently SNF remains appropriate.)     Assistance Recommended at Discharge Frequent or constant Supervision/Assistance  Patient can return home with the following A little help with walking and/or transfers;A little help with bathing/dressing/bathroom;Assistance with  cooking/housework;Direct supervision/assist for medications management;Direct supervision/assist for financial management;Assist for transportation;Help with stairs or ramp for entrance   Equipment Recommendations  Rolling walker (2 wheels);BSC/3in1       Precautions / Restrictions Precautions Precautions: Fall Restrictions Weight Bearing Restrictions: Yes LLE Weight Bearing: Weight bearing as tolerated     Mobility  Bed Mobility Overal bed mobility: Needs Assistance Bed Mobility: Supine to Sit  Supine to sit: Min assist  General bed mobility comments: Min assist required to exit bed moreso due to pain and needing to get up to BR to urinate    Transfers Overall transfer level: Needs assistance Equipment used: Rolling walker (2 wheels) Transfers: Sit to/from Stand Sit to Stand: Min guard  General transfer comment: CGA for safety with Vcs for technique improvements    Ambulation/Gait Ambulation/Gait assistance: Min guard, Supervision Gait Distance (Feet): 75 Feet Assistive device: Rolling walker (2 wheels) Gait Pattern/deviations: Step-through pattern, Antalgic, Trunk flexed, Decreased stance time - left Gait velocity: decreased     General Gait Details: Pt was able to ambulate into BR then out to community room. antalgic gait but overall tolerated well. distance limited by breakfats tray arriving. Encouraged increased gait and performance of HEP handouts that were issued this session.   Stairs Stairs: Yes    General stair comments: Will bring step next session to simulate home entry    Balance Overall balance assessment: Needs assistance, History of Falls Sitting-balance support: No upper extremity supported, Feet supported Sitting balance-Leahy Scale: Normal     Standing balance support: Bilateral upper extremity supported, During functional activity, Reliant on assistive device for balance, No upper extremity supported Standing balance-Leahy Scale: Fair Standing  balance comment: reliant on RW for dynamic standing activity  Cognition Arousal/Alertness: Awake/alert Behavior During Therapy: WFL for tasks assessed/performed Overall Cognitive Status: Within Functional Limits for tasks assessed   General Comments: Pt is alert to self, pelvis fx, and being in hospital. Does have some cognition deficits come to light during session however pt is pleasant and follows commands consistently throughout           General Comments General comments (skin integrity, edema, etc.): Chief Strategy Officer issued HEP handouts.      Pertinent Vitals/Pain Pain Assessment Pain Assessment: PAINAD Breathing: normal Negative Vocalization: occasional moan/groan, low speech, negative/disapproving quality Facial Expression: smiling or inexpressive Body Language: relaxed Consolability: distracted or reassured by voice/touch PAINAD Score: 2 Pain Location: L hip Pain Descriptors / Indicators: Sore Pain Intervention(s): Limited activity within patient's tolerance, Monitored during session, Premedicated before session, Repositioned     PT Goals (current goals can now be found in the care plan section) Acute Rehab PT Goals Patient Stated Goal: none stated Progress towards PT goals: Progressing toward goals    Frequency    7X/week      PT Plan Current plan remains appropriate       AM-PAC PT "6 Clicks" Mobility   Outcome Measure  Help needed turning from your back to your side while in a flat bed without using bedrails?: A Little Help needed moving from lying on your back to sitting on the side of a flat bed without using bedrails?: A Little Help needed moving to and from a bed to a chair (including a wheelchair)?: A Little Help needed standing up from a chair using your arms (e.g., wheelchair or bedside chair)?: A Little Help needed to walk in hospital room?: A Little Help needed climbing 3-5 steps with a railing? : A Little 6 Click Score: 18    End of Session  Equipment Utilized During Treatment: Gait belt Activity Tolerance: Patient tolerated treatment well Patient left: in chair (In community room with RN staff aware and breakfast tray set up in front of her) Nurse Communication: Mobility status PT Visit Diagnosis: Unsteadiness on feet (R26.81);History of falling (Z91.81);Other abnormalities of gait and mobility (R26.89);Muscle weakness (generalized) (M62.81);Pain Pain - Right/Left: Left Pain - part of body: Hip     Time: 0816-0829 PT Time Calculation (min) (ACUTE ONLY): 13 min  Charges:  $Gait Training: 8-22 mins                    Julaine Fusi PTA 07/05/22, 8:45 AM

## 2022-07-06 DIAGNOSIS — F332 Major depressive disorder, recurrent severe without psychotic features: Secondary | ICD-10-CM | POA: Diagnosis not present

## 2022-07-06 MED ORDER — MODAFINIL 100 MG PO TABS
100.0000 mg | ORAL_TABLET | Freq: Every day | ORAL | Status: DC
  Administered 2022-07-07 – 2022-07-12 (×6): 100 mg via ORAL
  Filled 2022-07-06 (×6): qty 1

## 2022-07-06 NOTE — Progress Notes (Signed)
[facility] MD Progress Note  07/06/2022 10:53 AM Cathy [name]  MRN:  [id number] Subjective: Cathy [name] is seen on rounds.  She is in the day room alert and oriented.  She answers questions appropriately.  Nurses report that she was more irritable and angry this morning.  I will cut back on her Provigil and see if that helps.  The Provigil has helped her become more alert and interactive.  She denies any depression although she is very flat.  She is also delusional about her house being sold.  She had a fall on 07/01/22 and diagnosed with left inferior superior pubic ramus fracture and is WBAT per ortho consult note on 1/20.  She is back on one-to-one.  Social work has referred her to a SNF today.  She has been compliant with medications and no side effects.  She needs constant supervision and rehabilitation.  Principal Problem: Major depressive disorder, recurrent episode, severe (HCC) Diagnosis: Principal Problem:   Major depressive disorder, recurrent episode, severe (HCC) Active Problems:   Pelvic fracture (HCC)   Inferior pubic ramus fracture, left, closed, initial encounter ([place])  Total Time spent with patient: 15 minutes  Past Psychiatric History: She has been on Lexapro and Trintellix,  prescribed by her PCP. Recent trial of Wellbutrin. No past psychiatric hospitalizations and no past suicide attempts.      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   Vision changes      Past Surgical History:  Procedure Laterality Date   CHOLECYSTECTOMY     IR IMAGING GUIDED PORT INSERTION  09/21/2020   LITHOTRIPSY  1984   LYMPH NODE BIOPSY     MASS BIOPSY Left 09/03/2020   Procedure: LEFT NECK JUGULAR NODE;  Surgeon: Rozetta Nunnery, MD;  Location: [facility];  Service: ENT;  Laterality: Left;   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   Procedure: LEFT TOTAL KNEE ARTHROPLASTY;  Surgeon: Gaynelle Arabian, MD;  Location: [facility];  Service: Orthopedics;  Laterality: Left;   Family History:  Family History  Problem Relation Age of Onset   Hypertension Mother    Stroke Mother    Heart attack Mother        CABG, 5 Stents   Dementia Mother    Cancer Father        Kidney   Asthma Sister    Diabetes Brother        Borderline   Alcohol abuse Brother    Cancer Maternal Aunt        stomach ca   Cancer Maternal Aunt        ovarian ca   Breast cancer Cousin    Colon cancer Neg Hx     Social History:  Social History   Substance and Sexual Activity  Alcohol Use No     Social History   Substance and Sexual  Activity  Drug Use No    Social History   Socioeconomic History   Marital status: Widowed    Spouse name: Not on file   Number of children: Not on file   Years of education: Not on file   Highest education level: Not on file  Occupational History   Not on file  Tobacco Use   Smoking status: Never   Smokeless tobacco: Never  Substance and Sexual Activity   Alcohol use: No   Drug use: No   Sexual activity: Not Currently    Birth control/protection: Post-menopausal  Other Topics Concern   Not on file  Social History Narrative   Lives alone.     Son is [name].     Social Determinants of Health   Financial Resource Strain: Not on file  Food Insecurity: No Food Insecurity (06/14/2022)   Hunger Vital Sign    Worried About Running Out of Food in the Last Year: Never true    Ran Out of Food in the Last Year: Never true   Transportation Needs: No Transportation Needs (06/14/2022)   PRAPARE - Hydrologist (Medical): No    Lack of Transportation (Non-Medical): No  Physical Activity: Not on file  Stress: Not on file  Social Connections: Not on file   Additional Social History:                         Sleep: Good  Appetite:  Fair  Current Medications: Current Facility-Administered Medications  Medication Dose Route Frequency Provider Last Rate Last Admin   alum & mag hydroxide-simeth (MAALOX/MYLANTA) 200-200-20 MG/5ML suspension 30 mL  30 mL Oral Q4H PRN Parks Ranger, DO       aspirin EC tablet 81 mg  81 mg Oral Daily Parks Ranger, DO   81 mg at 07/06/22 8338   atorvastatin (LIPITOR) tablet 20 mg  20 mg Oral Daily Parks Ranger, DO   20 mg at 07/06/22 0825   cholecalciferol (VITAMIN D3) 25 MCG (1000 UNIT) tablet 1,000 Units  1,000 Units Oral Daily Parks Ranger, DO   1,000 Units at 07/06/22 0825   cyanocobalamin (VITAMIN B12) tablet 1,000 mcg  1,000 mcg Oral Daily Parks Ranger, DO   1,000 mcg at 07/06/22 2505   feeding supplement (ENSURE ENLIVE / ENSURE PLUS) liquid 237 mL  237 mL Oral TID BM Parks Ranger, DO   237 mL at 07/06/22 3976   ibuprofen (ADVIL) tablet 400 mg  400 mg Oral Q6H PRN [name], [name], MD   400 mg at 07/05/22 0249   lip balm (BLISTEX) ointment   Topical PRN Parks Ranger, DO       LORazepam (ATIVAN) tablet 0.5 mg  0.5 mg Oral Q6H PRN Parks Ranger, DO   0.5 mg at 07/05/22 0249   losartan (COZAAR) tablet 25 mg  25 mg Oral Daily Parks Ranger, DO   25 mg at 07/06/22 0825   magnesium hydroxide (MILK OF MAGNESIA) suspension 30 mL  30 mL Oral Daily PRN Parks Ranger, DO   30 mL at 06/24/22 1006   mirtazapine (REMERON) tablet 7.5 mg  7.5 mg Oral QHS Parks Ranger, DO   7.5 mg at 07/05/22 2116   [START ON 07/07/2022] modafinil (PROVIGIL) tablet  100 mg  100 mg Oral Daily Parks Ranger, DO       multivitamin with minerals tablet  1 tablet  1 tablet Oral Daily Parks Ranger, DO   1 tablet at 07/06/22 0825   OLANZapine (ZYPREXA) tablet 5 mg  5 mg Oral QHS Parks Ranger, DO   5 mg at 07/05/22 2115   oxyCODONE-acetaminophen (PERCOCET) 7.5-325 MG per tablet 1 tablet  1 tablet Oral Q6H PRN [name], [name], MD   1 tablet at 07/06/22 0824   pantoprazole (PROTONIX) EC tablet 40 mg  40 mg Oral Daily Parks Ranger, DO   40 mg at 07/06/22 0825   polyethylene glycol (MIRALAX / GLYCOLAX) packet 17 g  17 g Oral Daily Parks Ranger, DO   17 g at 07/06/22 9833   traZODone (DESYREL) tablet 25 mg  25 mg Oral QHS PRN Parks Ranger, DO       venlafaxine XR (EFFEXOR-XR) 24 hr capsule 225 mg  225 mg Oral Q breakfast Parks Ranger, DO   225 mg at 07/06/22 0825    Lab Results: No results found for this or any previous visit (from the past 44 hour(s)).  Blood Alcohol level:  Lab Results  Component Value Date   ETH <10 82/50/5397    Metabolic Disorder Labs: Lab Results  Component Value Date   HGBA1C 5.5 06/10/2022   MPG 111.15 06/10/2022   MPG 105 04/05/2021   No results found for: "PROLACTIN" Lab Results  Component Value Date   CHOL 246 (H) 06/10/2022   TRIG 94 06/10/2022   HDL 57 06/10/2022   CHOLHDL 4.3 06/10/2022   VLDL 19 06/10/2022   LDLCALC 170 (H) 06/10/2022   LDLCALC 67 04/05/2021    Physical Findings: AIMS:  , ,  ,  ,    CIWA:    COWS:     Musculoskeletal: Strength & Muscle Tone: within normal limits Gait & Station: unable to stand Patient leans: N/A  Psychiatric Specialty Exam:  Presentation  General Appearance:  Appropriate for Environment; Casual  Eye Contact: Good  Speech: Clear and Coherent; Normal Rate  Speech Volume: Normal  Handedness: Right   Mood and Affect  Mood: Dysphoric  Affect: Congruent   Thought Process  Thought  Processes: Coherent  Descriptions of Associations:Intact  Orientation:Full (Time, Place and Person)  Thought Content:Logical  History of Schizophrenia/Schizoaffective disorder:No  Duration of Psychotic Symptoms:No data recorded Hallucinations:No data recorded Ideas of Reference:None  Suicidal Thoughts:No data recorded Homicidal Thoughts:No data recorded  Sensorium  Memory: Immediate Good; Recent Good; Remote Good  Judgment: Good  Insight: Good   Executive Functions  Concentration: Good  Attention Span: Good  Recall: Good  Fund of Knowledge: Good  Language: Good   Psychomotor Activity  Psychomotor Activity:No data recorded  Assets  Assets: Physical Health; Resilience; Social Support; Catering manager; Housing   Sleep  Sleep:No data recorded   Physical Exam: Physical Exam Vitals and nursing note reviewed.  Constitutional:      Appearance: Normal appearance. She is normal weight.  Neurological:     General: No focal deficit present.     Mental Status: She is alert and oriented to person, place, and time.  Psychiatric:        Attention and Perception: Attention and perception normal.        Mood and Affect: Mood is depressed. Affect is flat.        Speech: Speech normal.        Behavior: Behavior normal. Behavior is cooperative.        Thought Content: Thought content is delusional.  Cognition and Memory: Memory normal. Cognition is impaired.        Judgment: Judgment normal.    Review of Systems  Constitutional: Negative.   HENT: Negative.    Eyes: Negative.   Respiratory: Negative.    Cardiovascular: Negative.   Gastrointestinal: Negative.   Genitourinary: Negative.   Musculoskeletal: Negative.   Skin: Negative.   Neurological: Negative.   Endo/Heme/Allergies: Negative.   Psychiatric/Behavioral: Negative.     Blood pressure 139/72, pulse 99, temperature 99 F (37.2 C), temperature source Oral, resp. rate 16,  height '5\' 2"'$  (1.575 m), SpO2 97 %. Body mass index is 20.01 kg/m.   Treatment Plan Summary: Daily contact with patient to assess and evaluate symptoms and progress in treatment, Medication management, and Plan decrease Provigil 200 mg/day.  Continue current medications.  Social work sent out [employer] today.  Parks Ranger, DO 07/06/2022, 10:53 AM

## 2022-07-06 NOTE — Plan of Care (Signed)
  Problem: Education: Goal: Knowledge of General Education information will improve Description: Including pain rating scale, medication(s)/side effects and non-pharmacologic comfort measures Outcome: Progressing   Problem: Health Behavior/Discharge Planning: Goal: Ability to manage health-related needs will improve Outcome: Progressing   Problem: Clinical Measurements: Goal: Ability to maintain clinical measurements within normal limits will improve Outcome: Progressing Goal: Will remain free from infection Outcome: Progressing Goal: Diagnostic test results will improve Outcome: Progressing Goal: Respiratory complications will improve Outcome: Progressing Goal: Cardiovascular complication will be avoided Outcome: Progressing   Problem: Self-Concept: Goal: Will verbalize positive feelings about self Outcome: Progressing Goal: Level of anxiety will decrease Outcome: Progressing   Problem: Safety: Goal: Ability to disclose and discuss suicidal ideas will improve Outcome: Progressing Goal: Ability to identify and utilize support systems that promote safety will improve Outcome: Progressing   Problem: Role Relationship: Goal: Will demonstrate positive changes in social behaviors and relationships Outcome: Progressing   Problem: Health Behavior/Discharge Planning: Goal: Ability to make decisions will improve Outcome: Progressing Goal: Compliance with therapeutic regimen will improve Outcome: Progressing

## 2022-07-06 NOTE — Progress Notes (Addendum)
Safety round complete. Patient located in bedroom, asleep, lying supine position. Q15 mins checks will be continued.   

## 2022-07-06 NOTE — Progress Notes (Signed)
   07/06/22 0600  15 Minute Checks  Location Bedroom  Visual Appearance Calm  Behavior Sleeping  Sleep (Behavioral Health Patients Only)  Calculate sleep? (Click Yes once per 24 hr at 0600 safety check) Yes  Documented sleep last 24 hours 14.5

## 2022-07-06 NOTE — Progress Notes (Signed)
   07/06/22 0730  Psych Admission Type (Psych Patients Only)  Admission Status Involuntary  Psychosocial Assessment  Patient Complaints None  Eye Contact Fair  Facial Expression Worried;Anxious  Affect Anxious  Speech Soft  Interaction Assertive  Motor Activity Slow  Appearance/Hygiene Unremarkable  Behavior Characteristics Cooperative  Mood Pleasant  Thought Process  Coherency Disorganized  Content Preoccupation  Delusions None reported or observed  Perception WDL  Hallucination None reported or observed  Judgment Impaired  Confusion Moderate  Danger to Self  Current suicidal ideation? Denies  Danger to Others  Danger to Others None reported or observed

## 2022-07-06 NOTE — Progress Notes (Signed)
Physical Therapy Treatment Patient Details Name: Cathy [name] MRN: [id number] DOB: 04/10/1946 Today's Date: 07/06/2022   History of Present Illness Pt is a 77 y.o. female presenting "voluntarily to [facility] reporting worsening depressive symptoms and suicidal ideation" 06/10/22; transferred to [facility] 06/14/22.  Unwitnessed fall 06/18/22 hitting head. Pt now with fall 07/01/22 and diagnosed with left inferior superior pubic ramus fracture and is WBAT per ortho consult note on 1/20.   PMH includes htn, vision changes, L TKA, lymphoma, depression, anxiety.    PT Comments    Pt was pleasant and motivated to participate during the session and put forth good effort throughout. Pt remained antalgic with gait with pattern alternating between step-to and step-through but was generally steady without LOB.  Pt demonstrated fair eccentric and concentric control and stability during gait training with multi-modal cues needed for sequencing.  Pt generally unsteady during below balance training with frequent posterior LOB most notably with feet together and semi-tandem with head turns.  Pt remains at an elevated risk for falls and has limited assistance available at home.  Pt will benefit from PT services in a SNF setting upon discharge to safely address deficits listed in patient problem list for decreased caregiver assistance and eventual return to PLOF.     Recommendations for follow up therapy are one component of a multi-disciplinary discharge planning process, led by the attending physician.  Recommendations may be updated based on patient status, additional functional criteria and insurance authorization.  Follow Up Recommendations  Skilled nursing-short term rehab (<3 hours/day) Can patient physically be transported by private vehicle: Yes   Assistance Recommended at Discharge Frequent or constant Supervision/Assistance  Patient can return home with the following A little help with walking and/or  transfers;A little help with bathing/dressing/bathroom;Assistance with cooking/housework;Direct supervision/assist for medications management;Direct supervision/assist for financial management;Assist for transportation;Help with stairs or ramp for entrance   Equipment Recommendations  Rolling walker (2 wheels);BSC/3in1    Recommendations for Other Services       Precautions / Restrictions Precautions Precautions: Fall Restrictions Weight Bearing Restrictions: Yes LLE Weight Bearing: Weight bearing as tolerated     Mobility  Bed Mobility               General bed mobility comments: NT, pt in recliner    Transfers Overall transfer level: Needs assistance Equipment used: Rolling walker (2 wheels) Transfers: Sit to/from Stand Sit to Stand: Min guard           General transfer comment: Mod verbal cues for hand placement    Ambulation/Gait Ambulation/Gait assistance: Min guard Gait Distance (Feet): 50 Feet x 2 Assistive device: Rolling walker (2 wheels) Gait Pattern/deviations: Step-through pattern, Antalgic, Trunk flexed, Decreased stance time - left, Step-to pattern Gait velocity: decreased     General Gait Details: Pt alternated between step-to and beginning step-through pattern, antalgic on the LLE but generally steady without LOB   Stairs Stairs: Yes Stairs assistance: Min guard Stair Management: No rails, Backwards, Forwards, Step to pattern Number of Stairs: 1 General stair comments: Pt able to ascend backwards and descend forwards one step x 3 with mod verbal and tactile cues for sequencing   Wheelchair Mobility    Modified Rankin (Stroke Patients Only)       Balance Overall balance assessment: Needs assistance, History of Falls Sitting-balance support: No upper extremity supported, Feet supported Sitting balance-Leahy Scale: Normal     Standing balance support: During functional activity, No upper extremity supported Standing balance-Leahy  Scale: Poor  Standing balance comment: Frequent posterior LOB during unsupported standing balance training                            Cognition Arousal/Alertness: Awake/alert Behavior During Therapy: WFL for tasks assessed/performed Overall Cognitive Status: Within Functional Limits for tasks assessed                                          Exercises Total Joint Exercises Ankle Circles/Pumps: AROM, Strengthening, Both, 10 reps Long Arc Quad: Strengthening, Both, 10 reps Knee Flexion: Strengthening, Both, 10 reps Other Exercises Other Exercises: Dynamic unsupported standing balance training with reaching outside BOS with feet apart, together, and semi-tandem Other Exercises: Static unsupported standing balance training with feet apart, together, and semi-tandem with combinations of eyes open/closed and head still/head turns    General Comments        Pertinent Vitals/Pain Pain Assessment Pain Assessment: 0-10 Pain Score: 5  Pain Location: L hip Pain Descriptors / Indicators: Sore Pain Intervention(s): Premedicated before session, Monitored during session    Home Living                          Prior Function            PT Goals (current goals can now be found in the care plan section) Progress towards PT goals: Progressing toward goals    Frequency    7X/week      PT Plan Current plan remains appropriate    Co-evaluation              AM-PAC PT "6 Clicks" Mobility   Outcome Measure  Help needed turning from your back to your side while in a flat bed without using bedrails?: A Little Help needed moving from lying on your back to sitting on the side of a flat bed without using bedrails?: A Little Help needed moving to and from a bed to a chair (including a wheelchair)?: A Little Help needed standing up from a chair using your arms (e.g., wheelchair or bedside chair)?: A Little Help needed to walk in hospital room?: A  Little Help needed climbing 3-5 steps with a railing? : A Little 6 Click Score: 18    End of Session Equipment Utilized During Treatment: Gait belt Activity Tolerance: Patient tolerated treatment well Patient left: in chair;Other (comment) (Pt left in day room where found) Nurse Communication: Mobility status PT Visit Diagnosis: Unsteadiness on feet (R26.81);History of falling (Z91.81);Other abnormalities of gait and mobility (R26.89);Muscle weakness (generalized) (M62.81);Pain Pain - Right/Left: Left Pain - part of body: Hip     Time: 3664-4034 PT Time Calculation (min) (ACUTE ONLY): 24 min  Charges:  $Gait Training: 8-22 mins $Therapeutic Exercise: 8-22 mins                     D. Scott Velmer Woelfel PT, DPT 07/06/22, 10:42 AM

## 2022-07-06 NOTE — Progress Notes (Signed)
Patient is alert and oriented times 2. Mood and affect sad/sullen. Patient denies pain. She denies SI, HI, and AVH. Also denies feelings of anxiety and depression at this time. States she slept good last night. Morning meds given whole by mouth W/O difficulty. Patient consumed breakfast in the dayroom- appetite poor. Patient remains on unit with Q15 minute checks in place

## 2022-07-06 NOTE — BHH Counselor (Signed)
CSW was contacted by pt's son, Cathy [name], [phone] regarding pt's discharge.   CSW informed him that CSW had started finding facilities for SNF placement. He informed CSW that SNF would good fit, requested [facility] and [facility] for placement. [facility] potential opening inquiry was also requested.   CSW informed him of potential discharge for early next week.   Orval Dortch [place], MSW, LCSW-A 1/24/20241:24 PM

## 2022-07-06 NOTE — BHH Group Notes (Signed)
[facility] Group Notes:  (Nursing/MHT/Case Management/Adjunct)  Date:  07/06/2022  Time:  12:01 PM  Type of Therapy:  Movement Therapy  Participation Level:  Active  Participation Quality:  Appropriate  Affect:  Appropriate  Cognitive:  Alert, Appropriate, and Oriented  Insight:  Appropriate  Engagement in Group:  Engaged  Modes of Intervention:  Activity  Summary of Progress/Problems:  Cathy [name] l Cathy [name] 07/06/2022, 12:01 PM

## 2022-07-06 NOTE — Group Note (Signed)
[facility] LCSW Group Therapy Note   Group Date: 07/06/2022 Start Time: 8325 End Time: 1245  Type of Therapy/Topic:  Group Therapy:  Feelings about Diagnosis  Participation Level:  Did Not Attend   Mood:    Description of Group:    This group will allow patients to explore their thoughts and feelings about diagnoses they have received. Patients will be guided to explore their level of understanding and acceptance of these diagnoses. Facilitator will encourage patients to process their thoughts and feelings about the reactions of others to their diagnosis, and will guide patients in identifying ways to discuss their diagnosis with significant others in their lives. This group will be process-oriented, with patients participating in exploration of their own experiences as well as giving and receiving support and challenge from other group members.   Therapeutic Goals: 1. Patient will demonstrate understanding of diagnosis as evidence by identifying two or more symptoms of the disorder:  2. Patient will be able to express two feelings regarding the diagnosis 3. Patient will demonstrate ability to communicate their needs through discussion and/or role plays  Summary of Patient Progress:   X    Therapeutic Modalities:   Cognitive Behavioral Therapy Brief Therapy Feelings Identification    Atkins Martin[place], LCSWA

## 2022-07-06 NOTE — BH IP Treatment Plan (Signed)
Interdisciplinary Treatment and Diagnostic Plan Update  07/06/2022 Time of Session: 9:00AM Cathy [name] MRN: [id number]  Principal Diagnosis: Major depressive disorder, recurrent episode, severe ([place])  Secondary Diagnoses: Principal Problem:   Major depressive disorder, recurrent episode, severe ([place]) Active Problems:   Pelvic fracture ([place])   Inferior pubic ramus fracture, left, closed, initial encounter ([place])   Current Medications:  Current Facility-Administered Medications  Medication Dose Route Frequency Provider Last Rate Last Admin   alum & mag hydroxide-simeth (MAALOX/MYLANTA) 200-200-20 MG/5ML suspension 30 mL  30 mL Oral Q4H PRN Parks Ranger, DO       aspirin EC tablet 81 mg  81 mg Oral Daily Parks Ranger, DO   81 mg at 07/06/22 0093   atorvastatin (LIPITOR) tablet 20 mg  20 mg Oral Daily Parks Ranger, DO   20 mg at 07/06/22 0825   cholecalciferol (VITAMIN D3) 25 MCG (1000 UNIT) tablet 1,000 Units  1,000 Units Oral Daily Parks Ranger, DO   1,000 Units at 07/06/22 0825   cyanocobalamin (VITAMIN B12) tablet 1,000 mcg  1,000 mcg Oral Daily Parks Ranger, DO   1,000 mcg at 07/06/22 8182   feeding supplement (ENSURE ENLIVE / ENSURE PLUS) liquid 237 mL  237 mL Oral TID BM Parks Ranger, DO   237 mL at 07/06/22 9937   ibuprofen (ADVIL) tablet 400 mg  400 mg Oral Q6H PRN [name], [name], MD   400 mg at 07/05/22 0249   lip balm (BLISTEX) ointment   Topical PRN Parks Ranger, DO       LORazepam (ATIVAN) tablet 0.5 mg  0.5 mg Oral Q6H PRN Parks Ranger, DO   0.5 mg at 07/05/22 0249   losartan (COZAAR) tablet 25 mg  25 mg Oral Daily Parks Ranger, DO   25 mg at 07/06/22 0825   magnesium hydroxide (MILK OF MAGNESIA) suspension 30 mL  30 mL Oral Daily PRN Parks Ranger, DO   30 mL at 06/24/22 1006   mirtazapine (REMERON) tablet 7.5 mg  7.5 mg Oral QHS Parks Ranger, DO   7.5  mg at 07/05/22 2116   [START ON 07/07/2022] modafinil (PROVIGIL) tablet 100 mg  100 mg Oral Daily Parks Ranger, DO       multivitamin with minerals tablet 1 tablet  1 tablet Oral Daily Parks Ranger, DO   1 tablet at 07/06/22 0825   OLANZapine (ZYPREXA) tablet 5 mg  5 mg Oral QHS Parks Ranger, DO   5 mg at 07/05/22 2115   oxyCODONE-acetaminophen (PERCOCET) 7.5-325 MG per tablet 1 tablet  1 tablet Oral Q6H PRN [name], [name], MD   1 tablet at 07/06/22 0824   pantoprazole (PROTONIX) EC tablet 40 mg  40 mg Oral Daily Parks Ranger, DO   40 mg at 07/06/22 0825   polyethylene glycol (MIRALAX / GLYCOLAX) packet 17 g  17 g Oral Daily Parks Ranger, DO   17 g at 07/06/22 1696   traZODone (DESYREL) tablet 25 mg  25 mg Oral QHS PRN Parks Ranger, DO       venlafaxine XR (EFFEXOR-XR) 24 hr capsule 225 mg  225 mg Oral Q breakfast Parks Ranger, DO   225 mg at 07/06/22 0825   PTA Medications: Medications Prior to Admission  Medication Sig Dispense Refill Last Dose   albuterol (VENTOLIN HFA) 108 (90 Base) MCG/ACT inhaler Inhale 2 puffs into the lungs every 6 (six) hours  as needed for wheezing or shortness of breath. 8 g 2    aspirin EC 81 MG tablet Take 81 mg by mouth daily.      atorvastatin (LIPITOR) 20 MG tablet Take 1 tablet (20 mg total) by mouth daily. 90 tablet 1    cetirizine (ZYRTEC) 10 MG tablet Take 1 tablet (10 mg total) by mouth daily. 90 tablet 1    cholecalciferol (CHOLECALCIFEROL) 25 MCG tablet Take 1 tablet (1,000 Units total) by mouth daily.      cyanocobalamin (VITAMIN B12) 1000 MCG tablet Take 1 tablet (1,000 mcg total) by mouth daily.      denosumab (PROLIA) 60 MG/ML SOSY injection Inject 60 mg into the skin every 6 (six) months.      estradiol (ESTRACE) 0.1 MG/GM vaginal cream Discard plastic applicator. Insert blueberry size amount of cream on finger in vagina daily x1 week then 2x per week.      fluticasone  furoate-vilanterol (BREO ELLIPTA) 100-25 MCG/ACT AEPB Inhale 1 puff into the lungs daily. (Patient taking differently: Inhale 1 puff into the lungs daily as needed.) 60 each 5    glucose blood (ACCU-CHEK AVIVA PLUS) test strip Use to Monitor blood sugars daily. DX. E11.9 100 each 5    hydrochlorothiazide (MICROZIDE) 12.5 MG capsule Take 1 capsule (12.5 mg total) by mouth daily. 90 capsule 1    hydroxypropyl methylcellulose / hypromellose (ISOPTO TEARS / GONIOVISC) 2.5 % ophthalmic solution Place 2 drops into both eyes 3 (three) times daily as needed for dry eyes.      lidocaine-prilocaine (EMLA) cream Apply to affected area once (Patient taking differently: 1 Application as needed. Apply to affected area to port as needed) 30 g 3    losartan (COZAAR) 100 MG tablet TAKE 1 TABLET BY MOUTH ONCE A DAY 90 tablet 1    metoprolol succinate (TOPROL-XL) 50 MG 24 hr tablet Take 1 tablet (50 mg total) by mouth daily. Take with or immediately following a meal. 90 tablet 1    Multiple Vitamin (MULTIVITAMIN) capsule Take 1 capsule by mouth daily.      omeprazole (PRILOSEC) 20 MG capsule Take 1 capsule (20 mg total) by mouth daily. 90 capsule 1    venlafaxine XR (EFFEXOR XR) 75 MG 24 hr capsule Take 2 capsules (150 mg total) by mouth daily with breakfast. (Patient taking differently: Take 75 mg by mouth daily with breakfast.)       Patient Stressors:    Patient Strengths:    Treatment Modalities: Medication Management, Group therapy, Case management,  1 to 1 session with clinician, Psychoeducation, Recreational therapy.   Physician Treatment Plan for Primary Diagnosis: Major depressive disorder, recurrent episode, severe ([place]) Long Term Goal(s): Improvement in symptoms so as ready for discharge   Short Term Goals: Ability to identify changes in lifestyle to reduce recurrence of condition will improve Ability to verbalize feelings will improve Ability to disclose and discuss suicidal ideas Ability to  demonstrate self-control will improve Ability to identify and develop effective coping behaviors will improve Ability to maintain clinical measurements within normal limits will improve Compliance with prescribed medications will improve Ability to identify triggers associated with substance abuse/mental health issues will improve  Medication Management: Evaluate patient's response, side effects, and tolerance of medication regimen.  Therapeutic Interventions: 1 to 1 sessions, Unit Group sessions and Medication administration.  Evaluation of Outcomes: Progressing  Physician Treatment Plan for Secondary Diagnosis: Principal Problem:   Major depressive disorder, recurrent episode, severe ([place]) Active Problems:  Pelvic fracture ([facility])   Inferior pubic ramus fracture, left, closed, initial encounter ([place])  Long Term Goal(s): Improvement in symptoms so as ready for discharge   Short Term Goals: Ability to identify changes in lifestyle to reduce recurrence of condition will improve Ability to verbalize feelings will improve Ability to disclose and discuss suicidal ideas Ability to demonstrate self-control will improve Ability to identify and develop effective coping behaviors will improve Ability to maintain clinical measurements within normal limits will improve Compliance with prescribed medications will improve Ability to identify triggers associated with substance abuse/mental health issues will improve     Medication Management: Evaluate patient's response, side effects, and tolerance of medication regimen.  Therapeutic Interventions: 1 to 1 sessions, Unit Group sessions and Medication administration.  Evaluation of Outcomes: Progressing   RN Treatment Plan for Primary Diagnosis: Major depressive disorder, recurrent episode, severe ([place]) Long Term Goal(s): Knowledge of disease and therapeutic regimen to maintain health will improve  Short Term Goals: Ability to remain free from  injury will improve, Ability to verbalize frustration and anger appropriately will improve, Ability to demonstrate self-control, Ability to participate in decision making will improve, Ability to verbalize feelings will improve, Ability to identify and develop effective coping behaviors will improve, and Compliance with prescribed medications will improve  Medication Management: RN will administer medications as ordered by provider, will assess and evaluate patient's response and provide education to patient for prescribed medication. RN will report any adverse and/or side effects to prescribing provider.  Therapeutic Interventions: 1 on 1 counseling sessions, Psychoeducation, Medication administration, Evaluate responses to treatment, Monitor vital signs and CBGs as ordered, Perform/monitor CIWA, COWS, AIMS and Fall Risk screenings as ordered, Perform wound care treatments as ordered.  Evaluation of Outcomes: Progressing   LCSW Treatment Plan for Primary Diagnosis: Major depressive disorder, recurrent episode, severe ([place]) Long Term Goal(s): Safe transition to appropriate next level of care at discharge, Engage patient in therapeutic group addressing interpersonal concerns.  Short Term Goals: Engage patient in aftercare planning with referrals and resources, Increase social support, Increase ability to appropriately verbalize feelings, Increase emotional regulation, Facilitate acceptance of mental health diagnosis and concerns, and Increase skills for wellness and recovery  Therapeutic Interventions: Assess for all discharge needs, 1 to 1 time with Social worker, Explore available resources and support systems, Assess for adequacy in community support network, Educate family and significant other(s) on suicide prevention, Complete Psychosocial Assessment, Interpersonal group therapy.  Evaluation of Outcomes: Progressing   Progress in Treatment: Attending groups: Yes. Participating in groups:  Yes. Taking medication as prescribed: Yes. Toleration medication: Yes. Family/Significant other contact made: Yes, individual(s) contacted:  SPE completed with pt's son Toni Demo Patient understands diagnosis: Yes. Discussing patient identified problems/goals with staff: Yes. Medical problems stabilized or resolved: Yes. Denies suicidal/homicidal ideation: Yes. Issues/concerns per patient self-inventory: No. Other: None  New problem(s) identified: No, Describe:  None  New Short Term/Long Term Goal(s): Patient to work towards elimination of symptoms of psychosis, medication management for mood stabilization; development of comprehensive mental wellness plan.   New Short Term/Long Term Goal(s): Patient to work towards medication management for mood stabilization; elimination of SI thoughts; development of comprehensive mental wellness plan. Update 06/30/22: No changes at this time. Update 07/05/22: No changes at this time   Patient Goals: No additional goals identified at this time. Patient to continue to work towards original goals identified in initial treatment team meeting. CSW will remain available to patient should they voice additional treatment goals. Update 06/30/22:  No changes at this time. Update 07/05/22: No changes at this time   Discharge Plan or Barriers: No psychosocial barriers identified at this time, patient to return to place of residence when appropriate for discharge. Update 06/30/22: PT/OT recommended pt discharge to SNF, CSW spoke with pt's son who stated he was interested in taking pt to his home instead of SNF at discharge and finding care for pt from there. Update 07/05/22: Patient had a fall. SNF recommended. Referral for SNF facilities sent, pt discharge pending placement and provider statement of readiness   Reason for Continuation of Hospitalization: Depression Medication stabilization   Estimated Length of Stay: TBD  Last [place] Suicide Severity Risk  Score: Flowsheet Row Admission (Current) from 06/14/2022 in [place] [facility] from 06/10/2022 in [facility] Admission (Discharged) from 09/03/2020 in [place] No Risk No Risk No Risk       Last PHQ 2/9 Scores:    08/10/2020    9:35 AM 01/13/2020    3:59 PM 07/16/2019    9:36 AM  Depression screen PHQ 2/9  Decreased Interest 0 0 0  Down, Depressed, Hopeless 0 0 0  PHQ - 2 Score 0 0 0    Scribe for Treatment Team: Aubry Rankin A [place], Latanya Presser 07/06/2022 1:31 PM

## 2022-07-06 NOTE — BHH Counselor (Signed)
CSW contacted the following for pt regarding placement for SNF at discharge:   CSW will continue to follow up if acceptance and assist with facility acceptance at discharge.   Ellianne Gowen Martinique, MSW, LCSW-A 1/24/20242:36 PM   Service Provider Request Status Selected Services Address Phone Fax Patient Preferred  [facility] Preferred SNF  Considering [place], [place] [address] [phone] --  [facility] Preferred SNF  Pending - Request Sent N/A [address] [place] [address] [phone] --  [place] [facility] Preferred SNF  Pending - Request Sent N/A [address] [place] [address] [phone] --  [facility] [place] [facility]  Pending - Request Sent N/A [address] [place] [address] [place] --  [facility]anne [facility] Preferred SNF  Pending - [place] [address] [place] [address] [phone] --  [facility]AL[facility]  Pending - Request Sent N/A [address], [place] [address] [phone] --  [facility] SN[facility]/ALF  Pending - Request Sent N/A [address], [place] [address] [phone] --  [facility] SNF/ALF  Pending - Request Sent N/A [address] [place] [address] [phone] --  [facility] [place] [facility]/ALF  Pending - Request Sent N/A [address] [place] [address] [phone] --  [place] Preferred SNF  Pending - Request Sent N/A [place], [place] [address] [phone] --  [facility]  Pending - Request Sent N/A [address] [place] [address] [phone] --  [place] [facility]ppel Preferred SNF  Pending  - Request Sent N/A [address], [place] [address] [phone] --  [facility] [place] Preferred SNF  Pending - Request Sent N/A [address] [place] [address] [phone] --  [facility] SNF  Pending - Request Sent N/A [name], [place] [address] [phone] --  [place] [facility]  Pending - Request Sent N/A [address] [place] [address] [phone] --  [facility]  Pending - Request Sent N/A [address] [place] [address] [phone] --  [facility]  Pending - Request Sent N/A [address] [place] [address] [phone] --  [place] [facility]  Pending - Request Sent N/A [address] [place] [address] [phone] -- --  [facility] Preferred SNF  Pending - Request Sent N/A [address] [place], [place] [address] [phone] --  [facility] Moos SNF/ALF  Pending - Request Sent N/A [address] [place] [address] [phone] --  [facility]  Pending - No Request Sent N/A [place], [place] [address] [phone] --  [place] Preferred SNF  Declined N/A [address], [place] [address] [phone] --  [place] [place] RESOURCES [facility] SNF Preferred SNF  Declined Behavior Issues N/A [address], [place] [address] [phone] --  [facility] [place] PREFERRED SNF  Declined Insurance not in network N/A [address], [place] [address] [phone] --  [facility] Preferred SNF  Declined Cannot meet patient's needs N/A [address], [place] [address] [phone] --  [facility] Preferred SNF  Declined Cannot meet patient's needs N/A [address] [place] [address] [phone] --  [facility] Preferred SNF  Declined Cannot meet patient's psychosocial needs N/A [address] [place] [address] [phone] --  [facility] [place] Preferred SNF  Declined Insurance not in network N/A [address], [place] [address] [phone] --  [place] Preferred SNF  Declined Cannot meet patient's needs N/A [address] [place] [address] [phone] --  [facility]  Declined Cannot meet patient's needs N/A [address], [place] [address] [phone] --  [facility] Preferred SNF  Declined N/A [address] [place] [address] [phone] --  [facility] Preferred SNF  Declined Out of network N/A [address] [place] [address] [phone] [place] [facility]  [place], [place] [address] [phone] --  [facility] Preferred SNF  Declined N/A [address], [place] [address] [phone] --  [facility] SN[facility]  Declined Out of network N/A [address], [place] [address] [phone] --  [facility] Preferred SNF  [place], [place] [address] [phone] --  [facility]ysle Morales SNF  Declined Insurance not in [place], [place]  [id number] [phone] --

## 2022-07-06 NOTE — Progress Notes (Signed)
Patient denies SI, HI, and AVH. She is very pleasant and smiling this evening. She refused to drink an ensure, but ate a chocolate ice cream for snack. She is compliant with scheduled medications. Oxycodone given for L pelvis pain, which she rated as a 5/10. Patient remains on 1:1 for safety.  1:1 rounding  7pm: Patient is sitting in the dayroom talking with visitor. 1:1 sitter at side.  830pm: Patient is sitting in the dayroom eating snack. 1:1 sitter at side.  9pm: Patient is in bed awake. She says she is having trouble getting in a good position that is comfortable for her hip. Bed adjusted. 1:1 sitter at bedside.  1030pm: Patient is in bed asleep. Respirations even and unlabored. 1:1 sitter at bedside.  11pm: Patient is in bed asleep. Respirations even and unlabored. 1:1 sitter at bedside.  12am: Patient is in bed asleep. Respirations even and unlabored. 1:1 sitter at bedside.  1am: Patient is in bed asleep. Respirations even and unlabored. 1:1 sitter at bedside.  2am: Patient is in bed asleep. Respirations even and unlabored. 1:1 sitter at bedside.  3am: Patient is in bed asleep. Respirations even and unlabored. 1:1 sitter at bedside.  4am:Patient is in bed awake. 1:1 sitter at bedside.  5am: Patient endorses 5/10 pain in L pelvic area. Ibuprofen given for pain. 1:1 sitter at bedside.  6am: Patient is in bed asleep. Respirations even and unlabored. 1:1 sitter at bedside.

## 2022-07-07 DIAGNOSIS — F332 Major depressive disorder, recurrent severe without psychotic features: Secondary | ICD-10-CM | POA: Diagnosis not present

## 2022-07-07 NOTE — Progress Notes (Signed)
Patient denies SI, HI, and AVH. She is pleasant on approach and engages in conversation. She drank her Ensure and ate a snack in the dayroom. Patient also engaged in group before returning to her room. She endorses L pelvic pain, which she rates as a 4/10. Ibuprofen given for pain. Patient is compliant with scheduled medications. Patient remains on 1:1 sitter for safety.  1:1 hourly rounding  7pm: Patient is in the dayroom with a visitor.  830pm: Patient is in the dayroom eating snack and engaging in group activity.  930pm: Patient is awake in bed. 1:1 sitter at bedside.  1030pm: Patient is asleep in bed, 1:1 sitter at bedside  11pm: Patient is asleep in bed, 1:1 sitter at bedside  12am: Patient is asleep in bed, 1:1 sitter at bedside  130am: Patient is asleep in bed, 1:1 sitter at bedside  2am: Patient is asleep in bed, 1:1 sitter at bedside  330am: Patient is asleep in bed, 1:1 sitter at bedside  4am:Patient is asleep in bed, 1:1 sitter at bedside  5am: Patient is asleep in bed, 1:1 sitter at bedside  6am:  Patient is asleep in bed, 1:1 sitter at bedside

## 2022-07-07 NOTE — Plan of Care (Signed)
  Problem: Education: Goal: Knowledge of General Education information will improve Description: Including pain rating scale, medication(s)/side effects and non-pharmacologic comfort measures Outcome: Progressing   Problem: Health Behavior/Discharge Planning: Goal: Ability to manage health-related needs will improve Outcome: Progressing   Problem: Activity: Goal: Risk for activity intolerance will decrease Outcome: Progressing   Problem: Nutrition: Goal: Adequate nutrition will be maintained Outcome: Progressing   Problem: Elimination: Goal: Will not experience complications related to bowel motility Outcome: Progressing Goal: Will not experience complications related to urinary retention Outcome: Progressing   Problem: Coping: Goal: Level of anxiety will decrease Outcome: Progressing   Problem: Pain Managment: Goal: General experience of comfort will improve Outcome: Progressing   Problem: Self-Concept: Goal: Will verbalize positive feelings about self Outcome: Progressing Goal: Level of anxiety will decrease Outcome: Progressing   Problem: Safety: Goal: Ability to disclose and discuss suicidal ideas will improve Outcome: Progressing Goal: Ability to identify and utilize support systems that promote safety will improve Outcome: Progressing   Problem: Role Relationship: Goal: Will demonstrate positive changes in social behaviors and relationships Outcome: Progressing

## 2022-07-07 NOTE — Progress Notes (Signed)
Patient is alert and oriented times 2. Mood and affect sad/sullen. Patient denies pain. She denies SI, HI, and AVH. Also denies feelings of anxiety and depression at this time. States she slept good last night. Morning meds given whole by mouth W/O difficulty. Patient consumed breakfast in the dayroom- appetite poor. Patient remains on unit with Q15 minute checks in place

## 2022-07-07 NOTE — Progress Notes (Signed)
Physical Therapy Treatment Patient Details Name: ARIAH MOWER MRN: [id number] DOB: 24-Jul-1945 Today's Date: 07/07/2022   History of Present Illness Pt is a 77 y.o. female presenting "voluntarily to [facility] reporting worsening depressive symptoms and suicidal ideation" 06/10/22; transferred to [facility] 06/14/22.  Unwitnessed fall 06/18/22 hitting head. Pt now with fall 07/01/22 and diagnosed with left inferior superior pubic ramus fracture and is WBAT per ortho consult note on 1/20.   PMH includes htn, vision changes, L TKA, lymphoma, depression, anxiety.    PT Comments    Pt was asleep upon arrival. Easily awakes and is agreeable/cooperative for PT session. She was able to exit L side of bed, stand to RW and ambulate to BR. Successfully urinated prior to standing and ambulating into hallway. Attempted gait without AD however unsafe due to pain/weakness. Elected to continue to ambulate with RW. Pt ambulated to community room and breakfast tray set up. SNF remains appropriate DC disposition.    Recommendations for follow up therapy are one component of a multi-disciplinary discharge planning process, led by the attending physician.  Recommendations may be updated based on patient status, additional functional criteria and insurance authorization.  Follow Up Recommendations  Skilled nursing-short term rehab (<3 hours/day) Can patient physically be transported by private vehicle: Yes   Assistance Recommended at Discharge Frequent or constant Supervision/Assistance  Patient can return home with the following A little help with walking and/or transfers;A little help with bathing/dressing/bathroom;Assistance with cooking/housework;Direct supervision/assist for medications management;Direct supervision/assist for financial management;Assist for transportation;Help with stairs or ramp for entrance   Equipment Recommendations  Rolling walker (2 wheels);BSC/3in1       Precautions / Restrictions  Precautions Precautions: Fall Restrictions Weight Bearing Restrictions: Yes LLE Weight Bearing: Weight bearing as tolerated     Mobility  Bed Mobility Overal bed mobility: Needs Assistance Bed Mobility: Supine to Sit  Supine to sit: Supervision     Transfers Overall transfer level: Needs assistance Equipment used: Rolling walker (2 wheels) Transfers: Sit to/from Stand Sit to Stand: Supervision, Min guard (CGA for some transfers due to cognition and poor safety awareness. Does perform several STS with close supervision only)         Ambulation/Gait Ambulation/Gait assistance: Min guard, Supervision Gait Distance (Feet): 120 Feet Assistive device: Rolling walker (2 wheels) Gait Pattern/deviations: Step-through pattern, Antalgic, Trunk flexed, Decreased stance time - left, Step-to pattern Gait velocity: decreased     General Gait Details: no LOB during ambulation this date. attempted to ambulate without RW  (~5 ft)however due to pain/antalgic gait elected to continue with use of RW.      Balance Overall balance assessment: Needs assistance, History of Falls Sitting-balance support: No upper extremity supported, Feet supported Sitting balance-Leahy Scale: Normal     Standing balance support: During functional activity, No upper extremity supported Standing balance-Leahy Scale: Fair Standing balance comment: reliant on RW during dynamic task.                            Cognition Arousal/Alertness: Awake/alert Behavior During Therapy: WFL for tasks assessed/performed Overall Cognitive Status: Within Functional Limits for tasks assessed                                 General Comments: Pt is alert to self, pelvis fx, and being in hospital. Does have some cognition deficits come to light during session however pt is  pleasant and follows commands consistently throughout               Pertinent Vitals/Pain Pain Assessment Pain Assessment:  No/denies pain Pain Score: 0-No pain Pain Location: L hip Pain Descriptors / Indicators: Sore           PT Goals (current goals can now be found in the care plan section) Acute Rehab PT Goals Patient Stated Goal: none stated Progress towards PT goals: Progressing toward goals    Frequency    7X/week      PT Plan Current plan remains appropriate       AM-PAC PT "6 Clicks" Mobility   Outcome Measure  Help needed turning from your back to your side while in a flat bed without using bedrails?: A Little Help needed moving from lying on your back to sitting on the side of a flat bed without using bedrails?: A Little Help needed moving to and from a bed to a chair (including a wheelchair)?: A Little Help needed standing up from a chair using your arms (e.g., wheelchair or bedside chair)?: A Little Help needed to walk in hospital room?: A Little Help needed climbing 3-5 steps with a railing? : A Little 6 Click Score: 18    End of Session   Activity Tolerance: Patient tolerated treatment well Patient left: in chair (in community room to eat breakfast) Nurse Communication: Mobility status PT Visit Diagnosis: Unsteadiness on feet (R26.81);History of falling (Z91.81);Other abnormalities of gait and mobility (R26.89);Muscle weakness (generalized) (M62.81);Pain Pain - Right/Left: Left Pain - part of body: Hip     Time: 7209-4709 PT Time Calculation (min) (ACUTE ONLY): 20 min  Charges:  $Gait Training: 8-22 mins                     Julaine Fusi PTA 07/07/22, 5:44 PM

## 2022-07-07 NOTE — BHH Group Notes (Signed)
[place] Group Notes:  (Nursing/MHT/Case Management/Adjunct)  Date:  07/07/2022  Time:  6:20 PM  Type of Therapy:   Breathing techniques   Participation Level:  Did Not Attend  Participation Quality:   Did Not Attend   Affect:   Sleep  Cognitive:  Confused  Insight:  None  Engagement in Group:  None  Modes of Intervention:  Activity, Education, and Exploration  Summary of Progress/Problems:  Jamaul Heist l Milo Schreier 07/07/2022, 6:20 PM

## 2022-07-07 NOTE — Progress Notes (Signed)
[facility] MD Progress Note  07/07/2022 10:23 AM Cathy [name]  MRN:  [id number] Subjective: Cathy [name] is seen on rounds.  She is having some pain but she seems to tolerate it pretty well.  She has been sleeping well and her appetite is good.  She has been compliant with medications and no side effects.  She still has episodes of confusion and delusions but they are short-lived.  She is still depressed but doing better.  She is very pleasant and cooperative.  Overall, she is much improved.  Social work is working on SNF referral due to the fact that she needs 24/7 supervision and care along with physical therapy.  Principal Problem: Major depressive disorder, recurrent episode, severe (HCC) Diagnosis: Principal Problem:   Major depressive disorder, recurrent episode, severe (HCC) Active Problems:   Pelvic fracture (HCC)   Inferior pubic ramus fracture, left, closed, initial encounter ([place])  Total Time spent with patient: 15 minutes  Past Psychiatric History: She has been on Lexapro and Trintellix,  prescribed by her PCP. Recent trial of Wellbutrin. No past psychiatric hospitalizations and no past suicide attempts.       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   Vision changes     Past Surgical History:  Procedure Laterality Date   CHOLECYSTECTOMY     IR IMAGING GUIDED PORT INSERTION  09/21/2020   LITHOTRIPSY  1984   LYMPH NODE BIOPSY     MASS BIOPSY Left 09/03/2020    Procedure: LEFT NECK JUGULAR NODE;  Surgeon: Rozetta Nunnery, MD;  Location: [facility];  Service: ENT;  Laterality: Left;   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   Procedure: LEFT TOTAL KNEE ARTHROPLASTY;  Surgeon: Gaynelle Arabian, MD;  [facility];  Service: Orthopedics;  Laterality: Left;   Family History:  Family History  Problem Relation Age of Onset   Hypertension Mother    Stroke Mother    Heart attack Mother        CABG, 5 Stents   Dementia Mother    Cancer Father        Kidney   Asthma Sister    Diabetes Brother        Borderline   Alcohol abuse Brother    Cancer Maternal Aunt        stomach ca   Cancer Maternal Aunt        ovarian ca   Breast cancer Cousin    Colon cancer Neg Hx     Social History:  Social History   Substance and Sexual Activity  Alcohol Use No     Social History   Substance and Sexual Activity  Drug Use No    Social History   Socioeconomic History   Marital status: Widowed    Spouse name: Not on file   Number of children: Not on file  Years of education: Not on file   Highest education level: Not on file  Occupational History   Not on file  Tobacco Use   Smoking status: Never   Smokeless tobacco: Never  Substance and Sexual Activity   Alcohol use: No   Drug use: No   Sexual activity: Not Currently    Birth control/protection: Post-menopausal  Other Topics Concern   Not on file  Social History Narrative   Lives alone.     Son is [name].     Social Determinants of Health   Financial Resource Strain: Not on file  Food Insecurity: No Food Insecurity (06/14/2022)   Hunger Vital Sign    Worried About Running Out of Food in the Last Year: Never true    Ran Out of Food in the Last Year: Never true  Transportation Needs: No Transportation Needs (06/14/2022)   PRAPARE - Hydrologist (Medical): No    Lack of Transportation (Non-Medical): No  Physical Activity: Not on  file  Stress: Not on file  Social Connections: Not on file   Additional Social History:                         Sleep: Good  Appetite:  Good  Current Medications: Current Facility-Administered Medications  Medication Dose Route Frequency Provider Last Rate Last Admin   alum & mag hydroxide-simeth (MAALOX/MYLANTA) 200-200-20 MG/5ML suspension 30 mL  30 mL Oral Q4H PRN Parks Ranger, DO       aspirin EC tablet 81 mg  81 mg Oral Daily Parks Ranger, DO   81 mg at 07/07/22 0867   atorvastatin (LIPITOR) tablet 20 mg  20 mg Oral Daily Parks Ranger, DO   20 mg at 07/07/22 6195   cholecalciferol (VITAMIN D3) 25 MCG (1000 UNIT) tablet 1,000 Units  1,000 Units Oral Daily Parks Ranger, DO   1,000 Units at 07/07/22 0930   cyanocobalamin (VITAMIN B12) tablet 1,000 mcg  1,000 mcg Oral Daily Parks Ranger, DO   1,000 mcg at 07/07/22 0932   feeding supplement (ENSURE ENLIVE / ENSURE PLUS) liquid 237 mL  237 mL Oral TID BM Parks Ranger, DO   237 mL at 07/06/22 6712   ibuprofen (ADVIL) tablet 400 mg  400 mg Oral Q6H PRN [name], [name], MD   400 mg at 07/07/22 0455   lip balm (BLISTEX) ointment   Topical PRN Parks Ranger, DO       LORazepam (ATIVAN) tablet 0.5 mg  0.5 mg Oral Q6H PRN Parks Ranger, DO   0.5 mg at 07/07/22 0930   magnesium hydroxide (MILK OF MAGNESIA) suspension 30 mL  30 mL Oral Daily PRN Parks Ranger, DO   30 mL at 06/24/22 1006   mirtazapine (REMERON) tablet 7.5 mg  7.5 mg Oral QHS Parks Ranger, DO   7.5 mg at 07/06/22 2125   modafinil (PROVIGIL) tablet 100 mg  100 mg Oral Daily Parks Ranger, DO   100 mg at 07/07/22 4580   multivitamin with minerals tablet 1 tablet  1 tablet Oral Daily Parks Ranger, DO   1 tablet at 07/07/22 0929   OLANZapine (ZYPREXA) tablet 5 mg  5 mg Oral QHS Parks Ranger, DO   5 mg at 07/06/22 2125    oxyCODONE-acetaminophen (PERCOCET) 7.5-325 MG per tablet 1 tablet  1 tablet Oral Q6H PRN [name], [name],  MD   1 tablet at 07/07/22 0930   pantoprazole (PROTONIX) EC tablet 40 mg  40 mg Oral Daily Parks Ranger, DO   40 mg at 07/07/22 2330   polyethylene glycol (MIRALAX / GLYCOLAX) packet 17 g  17 g Oral Daily Parks Ranger, DO   17 g at 07/07/22 0930   traZODone (DESYREL) tablet 25 mg  25 mg Oral QHS PRN Parks Ranger, DO       venlafaxine XR (EFFEXOR-XR) 24 hr capsule 225 mg  225 mg Oral Q breakfast Parks Ranger, DO   225 mg at 07/07/22 0930    Lab Results: No results found for this or any previous visit (from the past 48 hour(s)).  Blood Alcohol level:  Lab Results  Component Value Date   ETH <10 07/62/2633    Metabolic Disorder Labs: Lab Results  Component Value Date   HGBA1C 5.5 06/10/2022   MPG 111.15 06/10/2022   MPG 105 04/05/2021   No results found for: "PROLACTIN" Lab Results  Component Value Date   CHOL 246 (H) 06/10/2022   TRIG 94 06/10/2022   HDL 57 06/10/2022   CHOLHDL 4.3 06/10/2022   VLDL 19 06/10/2022   LDLCALC 170 (H) 06/10/2022   LDLCALC 67 04/05/2021    Physical Findings: AIMS:  , ,  ,  ,    CIWA:    COWS:     Musculoskeletal: Strength & Muscle Tone: within normal limits Gait & Station: unsteady Patient leans: N/A  Psychiatric Specialty Exam:  Presentation  General Appearance:  Appropriate for Environment; Casual  Eye Contact: Good  Speech: Clear and Coherent; Normal Rate  Speech Volume: Normal  Handedness: Right   Mood and Affect  Mood: Dysphoric  Affect: Congruent   Thought Process  Thought Processes: Coherent  Descriptions of Associations:Intact  Orientation:Full (Time, Place and Person)  Thought Content:Logical  History of Schizophrenia/Schizoaffective disorder:No  Duration of Psychotic Symptoms:No data recorded Hallucinations:No data recorded Ideas of  Reference:None  Suicidal Thoughts:No data recorded Homicidal Thoughts:No data recorded  Sensorium  Memory: Immediate Good; Recent Good; Remote Good  Judgment: Good  Insight: Good   Executive Functions  Concentration: Good  Attention Span: Good  Recall: Good  Fund of Knowledge: Good  Language: Good   Psychomotor Activity  Psychomotor Activity:No data recorded  Assets  Assets: Physical Health; Resilience; Social Support; Catering manager; Housing   Sleep  Sleep:No data recorded   Physical Exam: Physical Exam Vitals and nursing note reviewed.  Constitutional:      Appearance: Normal appearance. She is normal weight.  Neurological:     General: No focal deficit present.     Mental Status: She is alert and oriented to person, place, and time.  Psychiatric:        Attention and Perception: Attention and perception normal.        Mood and Affect: Mood is depressed. Affect is flat.        Speech: Speech normal.        Behavior: Behavior normal. Behavior is cooperative.        Thought Content: Thought content normal.        Cognition and Memory: Cognition normal. Memory is impaired.        Judgment: Judgment is inappropriate.    Review of Systems  Constitutional: Negative.   HENT: Negative.    Eyes: Negative.   Respiratory: Negative.    Cardiovascular: Negative.   Gastrointestinal: Negative.   Genitourinary: Negative.   Musculoskeletal: Negative.  Skin: Negative.   Neurological: Negative.   Endo/Heme/Allergies: Negative.   Psychiatric/Behavioral:  Positive for depression.    Blood pressure (!) 104/50, pulse 82, temperature 99 F (37.2 C), temperature source Oral, resp. rate 16, height '5\' 2"'$  (1.575 m), SpO2 94 %. Body mass index is 20.01 kg/m.   Treatment Plan Summary: Daily contact with patient to assess and evaluate symptoms and progress in treatment, Medication management, and Plan due to her blood pressure being lowered and  discontinue Cozaar.  She is doing well on her psychiatric medicines.  Parks Ranger, DO 07/07/2022, 10:23 AM

## 2022-07-07 NOTE — Progress Notes (Signed)
   07/07/22 0815  Psych Admission Type (Psych Patients Only)  Admission Status Involuntary  Psychosocial Assessment  Patient Complaints None  Eye Contact Fair  Facial Expression Worried;Anxious  Affect Anxious  Speech Soft  Interaction Assertive  Motor Activity Slow  Appearance/Hygiene Unremarkable  Thought Process  Coherency Disorganized  Content Preoccupation  Delusions None reported or observed  Perception WDL  Hallucination None reported or observed  Judgment Impaired  Confusion Moderate  Danger to Self  Current suicidal ideation? Denies  Danger to Others  Danger to Others None reported or observed

## 2022-07-07 NOTE — Group Note (Signed)
[facility] LCSW Group Therapy Note   Group Date: 07/07/2022 Start Time: 6283 End Time: 1345   Type of Therapy/Topic:  Group Therapy:  Balance in Life  Participation Level:  Did Not Attend   Description of Group:    This group will address the concept of balance and how it feels and looks when one is unbalanced. Patients will be encouraged to process areas in their lives that are out of balance, and identify reasons for remaining unbalanced. Facilitators will guide patients utilizing problem- solving interventions to address and correct the stressor making their life unbalanced. Understanding and applying boundaries will be explored and addressed for obtaining  and maintaining a balanced life. Patients will be encouraged to explore ways to assertively make their unbalanced needs known to significant others in their lives, using other group members and facilitator for support and feedback.  Therapeutic Goals: Patient will identify two or more emotions or situations they have that consume much of in their lives. Patient will identify signs/triggers that life has become out of balance:  Patient will identify two ways to set boundaries in order to achieve balance in their lives:  Patient will demonstrate ability to communicate their needs through discussion and/or role plays  Summary of Patient Progress:  X    Therapeutic Modalities:   Cognitive Behavioral Therapy Solution-Focused Therapy Assertiveness Training   Hockley Martinique, LCSWA

## 2022-07-08 DIAGNOSIS — F332 Major depressive disorder, recurrent severe without psychotic features: Secondary | ICD-10-CM | POA: Diagnosis not present

## 2022-07-08 MED ORDER — VENLAFAXINE HCL ER 75 MG PO CP24
300.0000 mg | ORAL_CAPSULE | Freq: Every day | ORAL | Status: DC
  Administered 2022-07-09 – 2022-07-27 (×19): 300 mg via ORAL
  Filled 2022-07-08 (×19): qty 4

## 2022-07-08 NOTE — Progress Notes (Signed)
Patient is alert and oriented times 2. Mood and affect sad/sullen. Patient denies pain. She denies SI, HI, and AVH. Also denies feelings of anxiety and depression at this time. States she slept good last night. Morning meds given whole by mouth W/O difficulty. Patient consumed breakfast in the dayroom- appetite poor. Patient remains on unit with Q15 minute checks in place

## 2022-07-08 NOTE — Progress Notes (Signed)
[facility] MD Progress Note  07/08/2022 11:30 AM TIPHANIE VO  MRN:  [id number] Subjective: Cathy [name] is seen on rounds.  She is eating and sleeping well.  Cognitively she is improving.  She is tolerating medications without any side effects.  Her biggest complaint is leg pain which I think is contributing to her depression again.  She feels hopeless and helpless today.  I told her we will go ahead and adjust her medications.  Social work continues to look for skilled nursing facility.  Principal Problem: Major depressive disorder, recurrent episode, severe (HCC) Diagnosis: Principal Problem:   Major depressive disorder, recurrent episode, severe (HCC) Active Problems:   Pelvic fracture (HCC)   Inferior pubic ramus fracture, left, closed, initial encounter ([place])  Total Time spent with patient: 15 minutes  Past Psychiatric History: She has been on Lexapro and Trintellix,  prescribed by her PCP. Recent trial of Wellbutrin. No past psychiatric hospitalizations and no past suicide attempts.        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   Vision changes     Past Surgical History:  Procedure Laterality Date   CHOLECYSTECTOMY     IR IMAGING GUIDED PORT INSERTION  09/21/2020   LITHOTRIPSY  1984   LYMPH NODE BIOPSY     MASS BIOPSY Left 09/03/2020   Procedure: LEFT NECK JUGULAR NODE;  Surgeon: Rozetta Nunnery, MD;  Location: [facility];  Service: ENT;   Laterality: Left;   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   Procedure: LEFT TOTAL KNEE ARTHROPLASTY;  Surgeon: Gaynelle Arabian, MD;  [facility];  Service: Orthopedics;  Laterality: Left;   Family History:  Family History  Problem Relation Age of Onset   Hypertension Mother    Stroke Mother    Heart attack Mother        CABG, 5 Stents   Dementia Mother    Cancer Father        Kidney   Asthma Sister    Diabetes Brother        Borderline   Alcohol abuse Brother    Cancer Maternal Aunt        stomach ca   Cancer Maternal Aunt        ovarian ca   Breast cancer Cousin    Colon cancer Neg Hx     Social History:  Social History   Substance and Sexual Activity  Alcohol Use No     Social History   Substance and Sexual Activity  Drug Use No    Social History   Socioeconomic History   Marital status: Widowed    Spouse name: Not on file   Number of children: Not on file   Years of education: Not on file   Highest education level: Not on file  Occupational History   Not on file  Tobacco Use  Smoking status: Never   Smokeless tobacco: Never  Substance and Sexual Activity   Alcohol use: No   Drug use: No   Sexual activity: Not Currently    Birth control/protection: Post-menopausal  Other Topics Concern   Not on file  Social History Narrative   Lives alone.     Son is [name].     Social Determinants of Health   Financial Resource Strain: Not on file  Food Insecurity: No Food Insecurity (06/14/2022)   Hunger Vital Sign    Worried About Running Out of Food in the Last Year: Never true    Ran Out of Food in the Last Year: Never true  Transportation Needs: No Transportation Needs (06/14/2022)   PRAPARE - Hydrologist (Medical): No    Lack of Transportation (Non-Medical): No  Physical Activity: Not on file  Stress: Not on file  Social Connections: Not on file   Additional Social History:                          Sleep: Good  Appetite:  Fair  Current Medications: Current Facility-Administered Medications  Medication Dose Route Frequency Provider Last Rate Last Admin   alum & mag hydroxide-simeth (MAALOX/MYLANTA) 200-200-20 MG/5ML suspension 30 mL  30 mL Oral Q4H PRN Parks Ranger, DO       aspirin EC tablet 81 mg  81 mg Oral Daily Parks Ranger, DO   81 mg at 07/08/22 1025   atorvastatin (LIPITOR) tablet 20 mg  20 mg Oral Daily Parks Ranger, DO   20 mg at 07/08/22 1025   cholecalciferol (VITAMIN D3) 25 MCG (1000 UNIT) tablet 1,000 Units  1,000 Units Oral Daily Parks Ranger, DO   1,000 Units at 07/08/22 1026   cyanocobalamin (VITAMIN B12) tablet 1,000 mcg  1,000 mcg Oral Daily Parks Ranger, DO   1,000 mcg at 07/08/22 1026   feeding supplement (ENSURE ENLIVE / ENSURE PLUS) liquid 237 mL  237 mL Oral TID BM Parks Ranger, DO   237 mL at 07/08/22 1030   ibuprofen (ADVIL) tablet 400 mg  400 mg Oral Q6H PRN [name], [name], MD   400 mg at 07/08/22 1026   lip balm (BLISTEX) ointment   Topical PRN Parks Ranger, DO       LORazepam (ATIVAN) tablet 0.5 mg  0.5 mg Oral Q6H PRN Parks Ranger, DO   0.5 mg at 07/07/22 0930   magnesium hydroxide (MILK OF MAGNESIA) suspension 30 mL  30 mL Oral Daily PRN Parks Ranger, DO   30 mL at 06/24/22 1006   mirtazapine (REMERON) tablet 7.5 mg  7.5 mg Oral QHS Parks Ranger, DO   7.5 mg at 07/07/22 2109   modafinil (PROVIGIL) tablet 100 mg  100 mg Oral Daily Parks Ranger, DO   100 mg at 07/08/22 1026   multivitamin with minerals tablet 1 tablet  1 tablet Oral Daily Parks Ranger, DO   1 tablet at 07/08/22 1026   OLANZapine (ZYPREXA) tablet 5 mg  5 mg Oral QHS Parks Ranger, DO   5 mg at 07/07/22 2109   oxyCODONE-acetaminophen (PERCOCET) 7.5-325 MG per tablet 1 tablet  1 tablet Oral Q6H PRN [name], [name], MD   1 tablet at 07/07/22 0930    pantoprazole (PROTONIX) EC tablet 40 mg  40 mg Oral Daily Parks Ranger, DO  40 mg at 07/08/22 1026   polyethylene glycol (MIRALAX / GLYCOLAX) packet 17 g  17 g Oral Daily Parks Ranger, DO   17 g at 07/08/22 1025   traZODone (DESYREL) tablet 25 mg  25 mg Oral QHS PRN Parks Ranger, DO       [START ON 07/09/2022] venlafaxine XR (EFFEXOR-XR) 24 hr capsule 300 mg  300 mg Oral Q breakfast Parks Ranger, DO        Lab Results: No results found for this or any previous visit (from the past 48 hour(s)).  Blood Alcohol level:  Lab Results  Component Value Date   ETH <10 40/98/1191    Metabolic Disorder Labs: Lab Results  Component Value Date   HGBA1C 5.5 06/10/2022   MPG 111.15 06/10/2022   MPG 105 04/05/2021   No results found for: "PROLACTIN" Lab Results  Component Value Date   CHOL 246 (H) 06/10/2022   TRIG 94 06/10/2022   HDL 57 06/10/2022   CHOLHDL 4.3 06/10/2022   VLDL 19 06/10/2022   LDLCALC 170 (H) 06/10/2022   LDLCALC 67 04/05/2021    Physical Findings: AIMS:  , ,  ,  ,    CIWA:    COWS:     Musculoskeletal: Strength & Muscle Tone: within normal limits Gait & Station: unsteady Patient leans: N/A  Psychiatric Specialty Exam:  Presentation  General Appearance:  Appropriate for Environment; Casual  Eye Contact: Good  Speech: Clear and Coherent; Normal Rate  Speech Volume: Normal  Handedness: Right   Mood and Affect  Mood: Dysphoric  Affect: Congruent   Thought Process  Thought Processes: Coherent  Descriptions of Associations:Intact  Orientation:Full (Time, Place and Person)  Thought Content:Logical  History of Schizophrenia/Schizoaffective disorder:No  Duration of Psychotic Symptoms:No data recorded Hallucinations:No data recorded Ideas of Reference:None  Suicidal Thoughts:No data recorded Homicidal Thoughts:No data recorded  Sensorium  Memory: Immediate Good; Recent Good; Remote  Good  Judgment: Good  Insight: Good   Executive Functions  Concentration: Good  Attention Span: Good  Recall: Good  Fund of Knowledge: Good  Language: Good   Psychomotor Activity  Psychomotor Activity:No data recorded  Assets  Assets: Physical Health; Resilience; Social Support; Catering manager; Housing   Sleep  Sleep:No data recorded   Physical Exam: Physical Exam Vitals and nursing note reviewed.  Constitutional:      Appearance: Normal appearance. She is normal weight.  Neurological:     General: No focal deficit present.     Mental Status: She is alert and oriented to person, place, and time.  Psychiatric:        Attention and Perception: Attention and perception normal.        Mood and Affect: Mood is depressed. Affect is flat.        Speech: Speech normal.        Behavior: Behavior normal. Behavior is cooperative.        Thought Content: Thought content normal.        Cognition and Memory: Cognition and memory normal.        Judgment: Judgment normal.    Review of Systems  Constitutional: Negative.   HENT: Negative.    Eyes: Negative.   Respiratory: Negative.    Cardiovascular: Negative.   Gastrointestinal: Negative.   Genitourinary: Negative.   Musculoskeletal: Negative.   Skin: Negative.   Neurological: Negative.   Endo/Heme/Allergies: Negative.   Psychiatric/Behavioral:  Positive for depression.    Blood pressure 113/61, pulse 84, temperature 98.5  F (36.9 C), temperature source Oral, resp. rate 18, height '5\' 2"'$  (1.575 m), SpO2 99 %. Body mass index is 20.01 kg/m.   Treatment Plan Summary: Daily contact with patient to assess and evaluate symptoms and progress in treatment, Medication management, and Plan increase Effexor XR to 300 mg/day.  Continue PT.  [place], DO 07/08/2022, 11:30 AM

## 2022-07-08 NOTE — B[facility] Counselor (Signed)
CSW sent out the following SNF referrals for pt.  CSW will follow up regarding referrals.    Cathy [name], MSW, [place] 1/26/20249:04 AM   Destination  Service Provider Request Status Selected Services Address Phone Fax Patient Preferred  [facility] Preferred SNF  Considering [place], [place] [address] [phone] --  [facility] Preferred SNF  Pending - Request Sent N/A [address], [place] [address] [phone] --  [facility]  Pending - Request Sent N/A [address], [place] [address] [phone] --  [facility] Preferred [facility]F  Pending - [place] [address], [place] [address] [phone] --  [facility] SN[facility]  Pending - Request Sent N/A [address] [place] [address] [phone] --  [facility]  Pending - Request Sent N/A [address], [place] [address] [phone] --  [facility] SNF/ALF  Pending - Request Sent N/A [address], [place] [address] [phone] --  [place] Preferred SNF  Pending - Request Sent N/A [address] [place] [address] [phone] --  [facility] SN[facility]  Pending - Request Sent N/A [address], [place] [address] [phone] --  [facility] Preferred SNF  Pending - Request Sent N/A [address], [place] [address] [phone] --  [facility]  Pending - Request Sent N/A [address], [place] [address] [phone] --  [facility]  Pending - Request Sent N/A [address] [place] [address] [phone] --  [facility]  Pending - Request Sent N/A [address] [place] [address] [phone] --  [facility]  Pending - Request Sent N/A [address] [place] [address] [phone] --  [facility] Preferred SNF  Pending - Request Sent N/A [address] [place], [place]  [address] [phone] --  [facility]  Pending - No Request Sent N/A [place], [place] [address] [phone] --

## 2022-07-08 NOTE — Progress Notes (Signed)
   07/08/22 0715  Psych Admission Type (Psych Patients Only)  Admission Status Involuntary  Psychosocial Assessment  Patient Complaints None  Eye Contact Fair  Facial Expression Worried;Anxious  Affect Anxious  Speech Soft  Interaction Assertive  Motor Activity Slow  Appearance/Hygiene Unremarkable  Thought Process  Coherency Disorganized  Content Preoccupation  Delusions None reported or observed  Perception WDL  Hallucination None reported or observed  Judgment Impaired  Confusion Moderate  Danger to Self  Current suicidal ideation? Denies  Danger to Others  Danger to Others None reported or observed

## 2022-07-08 NOTE — Plan of Care (Signed)
  Problem: Education: Goal: Knowledge of General Education information will improve Description: Including pain rating scale, medication(s)/side effects and non-pharmacologic comfort measures Outcome: Progressing   Problem: Health Behavior/Discharge Planning: Goal: Ability to manage health-related needs will improve Outcome: Progressing   Problem: Clinical Measurements: Goal: Ability to maintain clinical measurements within normal limits will improve Outcome: Progressing Goal: Will remain free from infection Outcome: Progressing Goal: Diagnostic test results will improve Outcome: Progressing Goal: Respiratory complications will improve Outcome: Progressing Goal: Cardiovascular complication will be avoided Outcome: Progressing   Problem: Activity: Goal: Risk for activity intolerance will decrease Outcome: Progressing   Problem: Nutrition: Goal: Adequate nutrition will be maintained Outcome: Progressing   Problem: Coping: Goal: Level of anxiety will decrease Outcome: Progressing   Problem: Elimination: Goal: Will not experience complications related to bowel motility Outcome: Progressing Goal: Will not experience complications related to urinary retention Outcome: Progressing   Problem: Education: Goal: Utilization of techniques to improve thought processes will improve Outcome: Progressing Goal: Knowledge of the prescribed therapeutic regimen will improve Outcome: Progressing   Problem: Activity: Goal: Interest or engagement in leisure activities will improve Outcome: Progressing Goal: Imbalance in normal sleep/wake cycle will improve Outcome: Progressing   Problem: Coping: Goal: Coping ability will improve Outcome: Progressing Goal: Will verbalize feelings Outcome: Progressing   Problem: Health Behavior/Discharge Planning: Goal: Ability to make decisions will improve Outcome: Progressing Goal: Compliance with therapeutic regimen will improve Outcome:  Progressing   Problem: Role Relationship: Goal: Will demonstrate positive changes in social behaviors and relationships Outcome: Progressing   Problem: Safety: Goal: Ability to disclose and discuss suicidal ideas will improve Outcome: Progressing Goal: Ability to identify and utilize support systems that promote safety will improve Outcome: Progressing   Problem: Self-Concept: Goal: Will verbalize positive feelings about self Outcome: Progressing Goal: Level of anxiety will decrease Outcome: Progressing

## 2022-07-08 NOTE — BHH Counselor (Signed)
CSW received the the following updates regarding referrals sent to SNF facilities. CSW will continue to assist pt with finding appropriate SNF admission.   Carlis Blanchard Martinique, MSW, LCSW-A 1/26/20249:06 AM   [place] Preferred SNF  Declined N/A [address], [place] [address] [phone] --  [place] [facility] Preferred SNF  Declined Behavior Issues N/A [address], [place] [address] [phone] --  [facility] [place] [facility] Preferred SNF  Declined Behavior Issues N/A [address] [place] [address] [phone] --  [facility]REFERR[facility]  Declined Insurance not in network N/A [address] [place] [address] [phone] --  [facility]  Declined Cannot meet patient's needs N/A [address], [place] [address] [phone] --  [facility]F  [place] full N/A [address] [place] [address] [phone] --  [facility] Preferred SNF  Declined Cannot meet patient's needs N/A [address], [place] [address] [phone] --  [facility] [place] Preferred SNF  Declined Cannot meet patient's psychosocial needs N/A [address], [place] [address] [phone] --  [facility] Preferred [facility]  Declined Insurance not in network N/A [address], [place] [address] [phone] --  [place] Preferred SNF  Declined Cannot meet patient's needs N/A [address] [place] [address] [phone] --  [facility]  Declined Cannot meet patient's needs N/A [address] [place] [address]  [phone] --  [facility] Preferred SNF  Declined N/A [address] [place] [address] [phone] --  [facility]  Declined Cannot meet patient's psychosocial needs, SI N/A [address], [place] [address] [phone] --  [facility] [facility] Preferred SNF  Declined Out of network N/A [address], [place] [address] [phone] --  [facility]  Declined N/A [place], [place] [address] [phone] --  [facility] Preferred SNF  Declined N/A [address], [place] [address] [phone] --  [facility] SNF  Declined Out of network N/A [address] [place] [address] [phone] --  [facility] Preferred SN[facility]  Declined Facility full N/A [address] [place] [address] [phone] --  [facility]  Declined Insurance not in [place], [place] [address] [place] --  [place] [facility]  Declined Suicidal ideation N/A [address] [place] [address] [phone] -- --  [facility]/ALF  [place] full N/A [address] [place] [address] [phone]

## 2022-07-08 NOTE — Progress Notes (Signed)
Physical Therapy Treatment Patient Details Name: Cathy [name] MRN: [id number] DOB: 19-Jul-1945 Today's Date: 07/08/2022   History of Present Illness Pt is a 77 y.o. female presenting "voluntarily to [facility] reporting worsening depressive symptoms and suicidal ideation" 06/10/22; transferred to [facility] 06/14/22.  Unwitnessed fall 06/18/22 hitting head. Pt now with fall 07/01/22 and diagnosed with left inferior superior pubic ramus fracture and is WBAT per ortho consult note on 1/20.   PMH includes htn, vision changes, L TKA, lymphoma, depression, anxiety.    PT Comments    Pt was already up in bathroom with RN staff upon arriving. Pt requiring only supervision with use of RW. Is at high fall risk mostly due to poor safety awareness/cognition deficits. Overall pt is progressing well with all PT goals. Recommend continued routine mobility with use of RW. PT will decrease frequency to 2x a week. SNF remains appropriate to maximize pt's safety with ADLs while decreasing caregiver burden.     Recommendations for follow up therapy are one component of a multi-disciplinary discharge planning process, led by the attending physician.  Recommendations may be updated based on patient status, additional functional criteria and insurance authorization.  Follow Up Recommendations  Skilled nursing-short term rehab (<3 hours/day)     Assistance Recommended at Discharge Frequent or constant Supervision/Assistance  Patient can return home with the following A little help with walking and/or transfers;A little help with bathing/dressing/bathroom;Assistance with cooking/housework;Direct supervision/assist for medications management;Direct supervision/assist for financial management;Assist for transportation;Help with stairs or ramp for entrance   Equipment Recommendations  Rolling walker (2 wheels)       Precautions / Restrictions Precautions Precautions: Fall Restrictions Weight Bearing Restrictions: Yes LLE  Weight Bearing: Weight bearing as tolerated     Mobility  Bed Mobility  General bed mobility comments: Pt was standing in BR with RN staff providing supervision    Transfers Overall transfer level: Needs assistance Equipment used: Rolling walker (2 wheels) Transfers: Sit to/from Stand Sit to Stand: Supervision    Ambulation/Gait Ambulation/Gait assistance: Supervision Gait Distance (Feet): 100 Feet Assistive device: Rolling walker (2 wheels) Gait Pattern/deviations: Step-through pattern, Antalgic, Trunk flexed, Decreased stance time - left, Step-to pattern Gait velocity: decreased     General Gait Details: pt has slight antalgic gait however steady with BUE support. continue to recommend use of RW. Recommend continued routine mobility with RN staff   Balance Overall balance assessment: Needs assistance, History of Falls Sitting-balance support: No upper extremity supported, Feet supported Sitting balance-Leahy Scale: Normal     Standing balance support: During functional activity, No upper extremity supported Standing balance-Leahy Scale: Fair Standing balance comment: fall risk due to cognition>physical deficits       Cognition Arousal/Alertness: Awake/alert Behavior During Therapy: WFL for tasks assessed/performed Overall Cognitive Status: Within Functional Limits for tasks assessed    General Comments: Pt is alert and cooperative. Presents with flat affect but appropriate responds to questions               Pertinent Vitals/Pain Pain Assessment Pain Assessment: 0-10 Pain Score: 0-No pain Pain Location: L hip Pain Descriptors / Indicators: Sore Pain Intervention(s): Limited activity within patient's tolerance, Monitored during session, Premedicated before session, Repositioned     PT Goals (current goals can now be found in the care plan section) Acute Rehab PT Goals Patient Stated Goal: none stated Progress towards PT goals: Progressing toward goals     Frequency    Min 2X/week      PT Plan Current plan  remains appropriate;Frequency needs to be updated       AM-PAC PT "6 Clicks" Mobility   Outcome Measure  Help needed turning from your back to your side while in a flat bed without using bedrails?: A Little Help needed moving from lying on your back to sitting on the side of a flat bed without using bedrails?: A Little Help needed moving to and from a bed to a chair (including a wheelchair)?: A Little Help needed standing up from a chair using your arms (e.g., wheelchair or bedside chair)?: A Little Help needed to walk in hospital room?: A Little Help needed climbing 3-5 steps with a railing? : A Little 6 Click Score: 18    End of Session   Activity Tolerance: Patient tolerated treatment well Patient left: in chair (in day room with RN staff assisting for breakfast) Nurse Communication: Mobility status PT Visit Diagnosis: Unsteadiness on feet (R26.81);History of falling (Z91.81);Other abnormalities of gait and mobility (R26.89);Muscle weakness (generalized) (M62.81);Pain Pain - Right/Left: Left Pain - part of body: Hip     Time: 0742-0750 PT Time Calculation (min) (ACUTE ONLY): 8 min  Charges:  $Gait Training: 8-22 mins                     Julaine Fusi PTA 07/08/22, 8:02 AM

## 2022-07-09 DIAGNOSIS — F332 Major depressive disorder, recurrent severe without psychotic features: Secondary | ICD-10-CM | POA: Diagnosis not present

## 2022-07-09 NOTE — Plan of Care (Signed)
Pt endorses anxiety/depression at this time stating it is only a little bit. Pt denies SI/HI/AVH or pain at this time. Pt is calm and cooperative. Pt is medication compliant. Pt provided with support and encouragement. Pt monitored q15 minutes for safety per unit policy. Plan of care ongoing.   Problem: Activity: Goal: Risk for activity intolerance will decrease Outcome: Progressing   Problem: Nutrition: Goal: Adequate nutrition will be maintained Outcome: Progressing

## 2022-07-09 NOTE — Progress Notes (Signed)
[facility] MD Progress Note  07/09/2022 1:21 PM Cathy [name]  MRN:  [id number] Subjective: Cathy [name] is seen on rounds.  Nurses report that she slept well last night.  She is starting to become more depressed.  She does have leg pain and I told her to start asking for pain medication.  I increased her Effexor recently but that will take some time. Principal Problem: Major depressive disorder, recurrent episode, severe ([place]) Diagnosis: Principal Problem:   Major depressive disorder, recurrent episode, severe ([facility]) Active Problems:   Pelvic fracture ([place])   Inferior pubic ramus fracture, left, closed, initial encounter ([place])  Total Time spent with patient: 15 minutes  Past Psychiatric History: Depression  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   Vision changes     Past Surgical History:  Procedure Laterality Date   CHOLECYSTECTOMY     IR IMAGING GUIDED PORT INSERTION  09/21/2020   LITHOTRIPSY  1984   LYMPH NODE BIOPSY     MASS BIOPSY Left 09/03/2020   Procedure: LEFT NECK JUGULAR NODE;  Surgeon: Rozetta Nunnery, MD;  Location: [facility];  Service: ENT;  Laterality: Left;   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   Procedure: LEFT TOTAL KNEE ARTHROPLASTY;  Surgeon: Gaynelle Arabian, MD;  Location: [facility];  Service: Orthopedics;  Laterality: Left;   Family History:  Family History  Problem Relation Age of Onset   Hypertension Mother     Stroke Mother    Heart attack Mother        CABG, 5 Stents   Dementia Mother    Cancer Father        Kidney   Asthma Sister    Diabetes Brother        Borderline   Alcohol abuse Brother    Cancer Maternal Aunt        stomach ca   Cancer Maternal Aunt        ovarian ca   Breast cancer Cousin    Colon cancer Neg Hx    Family Psychiatric  History: Unremarkable Social History:  Social History   Substance and Sexual Activity  Alcohol Use No     Social History   Substance and Sexual Activity  Drug Use No    Social History   Socioeconomic History   Marital status: Widowed    Spouse name: Not on file   Number of children: Not on file   Years of education: Not on file   Highest education level: Not on file  Occupational History   Not on file  Tobacco Use   Smoking status: Never   Smokeless tobacco: Never  Substance and Sexual Activity   Alcohol use: No   Drug use: No   Sexual activity: Not Currently    Birth control/protection: Post-menopausal  Other Topics Concern   Not on file  Social  History Narrative   Lives alone.     Son is [name].     Social Determinants of Health   Financial Resource Strain: Not on file  Food Insecurity: No Food Insecurity (06/14/2022)   Hunger Vital Sign    Worried About Running Out of Food in the Last Year: Never true    Ran Out of Food in the Last Year: Never true  Transportation Needs: No Transportation Needs (06/14/2022)   PRAPARE - Hydrologist (Medical): No    Lack of Transportation (Non-Medical): No  Physical Activity: Not on file  Stress: Not on file  Social Connections: Not on file   Additional Social History:                         Sleep: Good  Appetite:  Fair  Current Medications: Current Facility-Administered Medications  Medication Dose Route Frequency Provider Last Rate Last Admin   alum & mag hydroxide-simeth (MAALOX/MYLANTA) 200-200-20 MG/5ML suspension 30 mL  30  mL Oral Q4H PRN Parks Ranger, DO       aspirin EC tablet 81 mg  81 mg Oral Daily Parks Ranger, DO   81 mg at 07/09/22 1000   atorvastatin (LIPITOR) tablet 20 mg  20 mg Oral Daily Parks Ranger, DO   20 mg at 07/09/22 1000   cholecalciferol (VITAMIN D3) 25 MCG (1000 UNIT) tablet 1,000 Units  1,000 Units Oral Daily Parks Ranger, DO   1,000 Units at 07/09/22 1000   cyanocobalamin (VITAMIN B12) tablet 1,000 mcg  1,000 mcg Oral Daily Parks Ranger, DO   1,000 mcg at 07/09/22 1000   feeding supplement (ENSURE ENLIVE / ENSURE PLUS) liquid 237 mL  237 mL Oral TID BM Parks Ranger, DO   237 mL at 07/09/22 1006   ibuprofen (ADVIL) tablet 400 mg  400 mg Oral Q6H PRN [name], [name], MD   400 mg at 07/08/22 1026   lip balm (BLISTEX) ointment   Topical PRN Parks Ranger, DO       LORazepam (ATIVAN) tablet 0.5 mg  0.5 mg Oral Q6H PRN Parks Ranger, DO   0.5 mg at 07/08/22 1432   magnesium hydroxide (MILK OF MAGNESIA) suspension 30 mL  30 mL Oral Daily PRN Parks Ranger, DO   30 mL at 06/24/22 1006   mirtazapine (REMERON) tablet 7.5 mg  7.5 mg Oral QHS Parks Ranger, DO   7.5 mg at 07/08/22 2122   modafinil (PROVIGIL) tablet 100 mg  100 mg Oral Daily Parks Ranger, DO   100 mg at 07/09/22 1000   multivitamin with minerals tablet 1 tablet  1 tablet Oral Daily Parks Ranger, DO   1 tablet at 07/09/22 1000   OLANZapine (ZYPREXA) tablet 5 mg  5 mg Oral QHS Parks Ranger, DO   5 mg at 07/08/22 2122   oxyCODONE-acetaminophen (PERCOCET) 7.5-325 MG per tablet 1 tablet  1 tablet Oral Q6H PRN [name], [name], MD   1 tablet at 07/08/22 2122   pantoprazole (PROTONIX) EC tablet 40 mg  40 mg Oral Daily Parks Ranger, DO   40 mg at 07/09/22 1000   polyethylene glycol (MIRALAX / GLYCOLAX) packet 17 g  17 g Oral Daily Parks Ranger, DO   17 g at 07/09/22 1000   traZODone (DESYREL)  tablet 25 mg  25 mg Oral QHS PRN Caren Griffins  Edward, DO       venlafaxine XR (EFFEXOR-XR) 24 hr capsule 300 mg  300 mg Oral Q breakfast Parks Ranger, DO   300 mg at 07/09/22 3976    Lab Results: No results found for this or any previous visit (from the past 48 hour(s)).  Blood Alcohol level:  Lab Results  Component Value Date   ETH <10 73/41/9379    Metabolic Disorder Labs: Lab Results  Component Value Date   HGBA1C 5.5 06/10/2022   MPG 111.15 06/10/2022   MPG 105 04/05/2021   No results found for: "PROLACTIN" Lab Results  Component Value Date   CHOL 246 (H) 06/10/2022   TRIG 94 06/10/2022   HDL 57 06/10/2022   CHOLHDL 4.3 06/10/2022   VLDL 19 06/10/2022   LDLCALC 170 (H) 06/10/2022   LDLCALC 67 04/05/2021    Physical Findings: AIMS:  , ,  ,  ,    CIWA:    COWS:     Musculoskeletal: Strength & Muscle Tone: within normal limits Gait & Station: unsteady Patient leans: N/A  Psychiatric Specialty Exam:  Presentation  General Appearance:  Appropriate for Environment; Casual  Eye Contact: Good  Speech: Clear and Coherent; Normal Rate  Speech Volume: Normal  Handedness: Right   Mood and Affect  Mood: Dysphoric  Affect: Congruent   Thought Process  Thought Processes: Coherent  Descriptions of Associations:Intact  Orientation:Full (Time, Place and Person)  Thought Content:Logical  History of Schizophrenia/Schizoaffective disorder:No  Duration of Psychotic Symptoms:No data recorded Hallucinations:No data recorded Ideas of Reference:None  Suicidal Thoughts:No data recorded Homicidal Thoughts:No data recorded  Sensorium  Memory: Immediate Good; Recent Good; Remote Good  Judgment: Good  Insight: Good   Executive Functions  Concentration: Good  Attention Span: Good  Recall: Good  Fund of Knowledge: Good  Language: Good   Psychomotor Activity  Psychomotor Activity:No data recorded  Assets   Assets: Physical Health; Resilience; Social Support; Catering manager; Housing   Sleep  Sleep:No data recorded    Blood pressure (!) 119/59, pulse 96, temperature 98.7 F (37.1 C), temperature source Oral, resp. rate 18, height '5\' 2"'$  (1.575 m), SpO2 98 %. Body mass index is 20.01 kg/m.   Treatment Plan Summary: Daily contact with patient to assess and evaluate symptoms and progress in treatment, Medication management, and Plan continue current medications.  [place], DO 07/09/2022, 1:21 PM

## 2022-07-09 NOTE — Progress Notes (Signed)
Patient is alert and oriented times 2. Mood and affect are sad and depressed. Patient rates pain as 0/10. She denies SI, HI, and AVH. Patient does endorse feelings of anxiety and depression at this time. Patient states she slept well last night. Evening medicines administered whole by mouth without difficulty. Patient ate snack in day room; appetite was fair. Patient remains on unit with Q15 minute checks in place, and 1:1 safety sitter in place.

## 2022-07-09 NOTE — Progress Notes (Signed)
1:1 Hourly Rounding  0730: Pt sleeping in bedroom, fall and raise of chx observed, breathing is regular and unlabored, sitter at side.  0830: Pt in dayroom calm and composed with sitter at side.  0930: Pt in dayroom calm and composed with sitter at side.  1030: Pt in dayroom calm and composed with sitter at side.  1130: Pt in dayroom calm and composed with sitter at side.  1230: Pt in dayroom calm and composed with sitter at side.  1330: Pt in dayroom calm and composed with sitter at side.  1430: Pt in dayroom calm and composed with sitter at side.  1530: Pt sleeping in bedroom, fall and raise of chx observed, breathing is regular and unlabored, sitter at side.  1630: Pt in dayroom calm and composed with sitter at side.  1730: Pt in dayroom calm and composed with sitter at side.  1830: Pt in dayroom calm and composed with sitter at side.  1900: Pt in dayroom with visitor calm and composed with sitter at side.

## 2022-07-10 DIAGNOSIS — F332 Major depressive disorder, recurrent severe without psychotic features: Secondary | ICD-10-CM | POA: Diagnosis not present

## 2022-07-10 MED ORDER — HALOPERIDOL 1 MG PO TABS
1.0000 mg | ORAL_TABLET | Freq: Every day | ORAL | Status: DC
  Administered 2022-07-10 – 2022-07-26 (×15): 1 mg via ORAL
  Filled 2022-07-10 (×16): qty 1

## 2022-07-10 NOTE — Progress Notes (Signed)
Patient stayed in the dayroom watching TV then went to her room. Currently in bed awake, reading, sitter at bedside.

## 2022-07-10 NOTE — Progress Notes (Signed)
[facility] MD Progress Note  07/10/2022 12:01 PM [name]  MRN:  [id number] Subjective: Cathy [name] is seen on rounds.  She still very depressed.  She also is in pain but does not ask for pain medications.  Nurses report that she becomes delusional in the evening about someone is trying to mess with her insurance.  She may be developing a little bit of dementia and some sundowning.  When to add a low-dose of Haldol in the evening to see if this helps.  We just went up on her Effexor recently.  Social work continues to look for skilled nursing facility.  Principal Problem: Major depressive disorder, recurrent episode, severe (HCC) Diagnosis: Principal Problem:   Major depressive disorder, recurrent episode, severe (HCC) Active Problems:   Pelvic fracture (HCC)   Inferior pubic ramus fracture, left, closed, initial encounter ([place])  Total Time spent with patient: 15 minutes  Past Psychiatric History: She has been on Lexapro and Trintellix,  prescribed by her PCP. Recent trial of Wellbutrin. No past psychiatric hospitalizations and no past suicide attempts.    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   Vision changes     Past Surgical History:  Procedure Laterality Date   CHOLECYSTECTOMY     IR IMAGING GUIDED PORT INSERTION  09/21/2020   LITHOTRIPSY  1984   LYMPH NODE BIOPSY     MASS BIOPSY Left 09/03/2020   Procedure: LEFT NECK JUGULAR NODE;  Surgeon: Rozetta Nunnery, MD;  Location: [facility];  Service: ENT;  Laterality: Left;   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   Procedure: LEFT TOTAL KNEE ARTHROPLASTY;  Surgeon: Gaynelle Arabian, MD;  Location: [facility];  Service: Orthopedics;  Laterality: Left;   Family History:  Family History  Problem Relation Age of Onset   Hypertension Mother    Stroke Mother    Heart attack Mother        CABG, 5 Stents   Dementia Mother    Cancer Father        Kidney   Asthma Sister    Diabetes Brother        Borderline   Alcohol abuse Brother    Cancer Maternal Aunt        stomach ca   Cancer Maternal Aunt        ovarian ca   Breast cancer Cousin    Colon cancer Neg Hx    Family Psychiatric  History: Unremarkable Social History:  Social History   Substance and Sexual Activity  Alcohol Use No     Social History   Substance and Sexual Activity  Drug Use No    Social History   Socioeconomic History   Marital status: Widowed    Spouse name: Not on file   Number of children: Not on file   Years of education: Not on file  Highest education level: Not on file  Occupational History   Not on file  Tobacco Use   Smoking status: Never   Smokeless tobacco: Never  Substance and Sexual Activity   Alcohol use: No   Drug use: No   Sexual activity: Not Currently    Birth control/protection: Post-menopausal  Other Topics Concern   Not on file  Social History Narrative   Lives alone.     Son is [name].     Social Determinants of Health   Financial Resource Strain: Not on file  Food Insecurity: No Food Insecurity (06/14/2022)   Hunger Vital Sign    Worried About Running Out of Food in the Last Year: Never true    Ran Out of Food in the Last Year: Never true  Transportation Needs: No Transportation Needs (06/14/2022)   PRAPARE - Hydrologist (Medical): No    Lack of Transportation (Non-Medical): No  Physical Activity: Not on file  Stress:  Not on file  Social Connections: Not on file   Additional Social History:                         Sleep: Good  Appetite:  Fair  Current Medications: Current Facility-Administered Medications  Medication Dose Route Frequency Provider Last Rate Last Admin   alum & mag hydroxide-simeth (MAALOX/MYLANTA) 200-200-20 MG/5ML suspension 30 mL  30 mL Oral Q4H PRN Parks Ranger, DO       aspirin EC tablet 81 mg  81 mg Oral Daily Parks Ranger, DO   81 mg at 07/10/22 0915   atorvastatin (LIPITOR) tablet 20 mg  20 mg Oral Daily Parks Ranger, DO   20 mg at 07/10/22 0915   cholecalciferol (VITAMIN D3) 25 MCG (1000 UNIT) tablet 1,000 Units  1,000 Units Oral Daily Parks Ranger, DO   1,000 Units at 07/10/22 0915   cyanocobalamin (VITAMIN B12) tablet 1,000 mcg  1,000 mcg Oral Daily Parks Ranger, DO   1,000 mcg at 07/10/22 0915   feeding supplement (ENSURE ENLIVE / ENSURE PLUS) liquid 237 mL  237 mL Oral TID BM Parks Ranger, DO   237 mL at 07/09/22 2111   haloperidol (HALDOL) tablet 1 mg  1 mg Oral QPC supper Parks Ranger, DO       ibuprofen (ADVIL) tablet 400 mg  400 mg Oral Q6H PRN [name], [name], MD   400 mg at 07/08/22 1026   lip balm (BLISTEX) ointment   Topical PRN Parks Ranger, DO       LORazepam (ATIVAN) tablet 0.5 mg  0.5 mg Oral Q6H PRN Parks Ranger, DO   0.5 mg at 07/08/22 1432   magnesium hydroxide (MILK OF MAGNESIA) suspension 30 mL  30 mL Oral Daily PRN Parks Ranger, DO   30 mL at 06/24/22 1006   mirtazapine (REMERON) tablet 7.5 mg  7.5 mg Oral QHS Parks Ranger, DO   7.5 mg at 07/09/22 2112   modafinil (PROVIGIL) tablet 100 mg  100 mg Oral Daily Parks Ranger, DO   100 mg at 07/10/22 0915   multivitamin with minerals tablet 1 tablet  1 tablet Oral Daily Parks Ranger, DO   1 tablet at 07/10/22 0915   OLANZapine (ZYPREXA) tablet 5 mg  5 mg Oral QHS  Parks Ranger, DO   5 mg at 07/09/22 2112   oxyCODONE-acetaminophen (PERCOCET) 7.5-325 MG  per tablet 1 tablet  1 tablet Oral Q6H PRN [name], [name], MD   1 tablet at 07/08/22 2122   pantoprazole (PROTONIX) EC tablet 40 mg  40 mg Oral Daily Parks Ranger, DO   40 mg at 07/10/22 0915   polyethylene glycol (MIRALAX / GLYCOLAX) packet 17 g  17 g Oral Daily Parks Ranger, DO   17 g at 07/10/22 0914   traZODone (DESYREL) tablet 25 mg  25 mg Oral QHS PRN Parks Ranger, DO       venlafaxine XR (EFFEXOR-XR) 24 hr capsule 300 mg  300 mg Oral Q breakfast Parks Ranger, DO   300 mg at 07/10/22 9024    Lab Results: No results found for this or any previous visit (from the past 48 hour(s)).  Blood Alcohol level:  Lab Results  Component Value Date   ETH <10 09/73/5329    Metabolic Disorder Labs: Lab Results  Component Value Date   HGBA1C 5.5 06/10/2022   MPG 111.15 06/10/2022   MPG 105 04/05/2021   No results found for: "PROLACTIN" Lab Results  Component Value Date   CHOL 246 (H) 06/10/2022   TRIG 94 06/10/2022   HDL 57 06/10/2022   CHOLHDL 4.3 06/10/2022   VLDL 19 06/10/2022   LDLCALC 170 (H) 06/10/2022   LDLCALC 67 04/05/2021    Physical Findings: AIMS:  , ,  ,  ,    CIWA:    COWS:     Musculoskeletal: Strength & Muscle Tone: within normal limits Gait & Station: unable to stand Patient leans: N/A  Psychiatric Specialty Exam:  Presentation  General Appearance:  Appropriate for Environment; Casual  Eye Contact: Good  Speech: Clear and Coherent; Normal Rate  Speech Volume: Normal  Handedness: Right   Mood and Affect  Mood: Dysphoric  Affect: Congruent   Thought Process  Thought Processes: Coherent  Descriptions of Associations:Intact  Orientation:Full (Time, Place and Person)  Thought Content:Logical  History of Schizophrenia/Schizoaffective disorder:No  Duration of Psychotic Symptoms:No data  recorded Hallucinations:No data recorded Ideas of Reference:None  Suicidal Thoughts:No data recorded Homicidal Thoughts:No data recorded  Sensorium  Memory: Immediate Good; Recent Good; Remote Good  Judgment: Good  Insight: Good   Executive Functions  Concentration: Good  Attention Span: Good  Recall: Good  Fund of Knowledge: Good  Language: Good   Psychomotor Activity  Psychomotor Activity:No data recorded  Assets  Assets: Physical Health; Resilience; Social Support; Catering manager; Housing   Sleep  Sleep:No data recorded   Physical Exam: Physical Exam Vitals and nursing note reviewed.  Constitutional:      Appearance: Normal appearance. She is normal weight.  Neurological:     General: No focal deficit present.     Mental Status: She is alert and oriented to person, place, and time.  Psychiatric:        Mood and Affect: Mood normal.        Behavior: Behavior normal.    Review of Systems  Constitutional: Negative.   HENT: Negative.    Eyes: Negative.   Respiratory: Negative.    Cardiovascular: Negative.   Gastrointestinal: Negative.   Genitourinary: Negative.   Musculoskeletal: Negative.   Skin: Negative.   Neurological: Negative.   Endo/Heme/Allergies: Negative.   Psychiatric/Behavioral: Negative.     Blood pressure 127/63, pulse 92, temperature 98.9 F (37.2 C), temperature source Oral, resp. rate 18, height '5\' 2"'$  (1.575 m), SpO2 96 %. Body mass index is 20.01 kg/m.   Treatment Plan  Summary: Daily contact with patient to assess and evaluate symptoms and progress in treatment, Medication management, and Plan continue current medication.  Parks Ranger, DO 07/10/2022, 12:01 PM

## 2022-07-10 NOTE — Group Note (Signed)
LCSW Group Therapy Note   Group Date: 07/10/2022 Start Time: 1400 End Time: 1440   Type of Therapy and Topic:  Group Therapy:  Cognitive Distortions  Participation Level:  None   Description of Group:    Patients in this group will be introduced to the topic of cognitive distortions.  Patients will identify and describe cognitive distortions, describe the feelings these distortions create for them.  Patients will identify one or more situations in their personal life where they have cognitively distorted thinking and will verbalize challenging this cognitive distortion through positive thinking skills.  Patients will practice the skill of using positive affirmations to challenge cognitive distortions using affirmation cards.    Therapeutic Goals:  Patient will identify two or more cognitive distortions they have used Patient will identify one or more emotions that stem from use of a cognitive distortion Patient will demonstrate use of a positive affirmation to counter a cognitive distortion through discussion and/or role play. Patient will describe one way cognitive distortions can be detrimental to wellness   Summary of Patient Progress: Pt attended group but, did not participate.      Therapeutic Modalities:   Cognitive Behavioral Therapy Motivational Interviewing   Vassie Moselle, LCSW 07/10/2022  2:48 PM

## 2022-07-10 NOTE — Progress Notes (Signed)
1:1 Hourly Rounding  0730: Pt in dayroom calm and composed with sitter present  0830: Pt in dayroom calm and composed with sitter present  0930: Pt in dayroom calm and composed with sitter present  1030: Pt in dayroom calm and composed with sitter present  1130: Pt in dayroom calm and composed with sitter present  1230: Pt in dayroom calm and composed with sitter present  1330: Pt in dayroom calm and composed with sitter present  1430: Pt in dayroom calm and composed attending group with sitter present  1530: Pt in dayroom calm and slightly anxious, therapeutic communication used and effective, sitter present.  1630: Pt in dayroom calm and composed with sitter present  1730: Pt in dayroom calm and composed with sitter present  1830: Pt in dayroom calm and composed with sitter present  1900: Pt in dayroom calm and composed visiting with visitor with sitter present

## 2022-07-10 NOTE — Progress Notes (Signed)
D: Pt alert and oriented x 2. Pt denies experiencing any anxiety/depression at this time. Pt denies experiencing any pain at this time. Pt denies experiencing any SI/HI, or AVH at this time.  Pt appeared confused w/ delusional thoughts, redirected by Probation officer.   A: Scheduled medications administered. Support and encouragement provided. Assigned sitter present with patient at all times. Routine safety checks conducted q15 minutes.   R: No adverse drug reactions noted. Pt is agreeable to notifying staff with any safety concerns. Pt remains safe at this time. Will plan of care.

## 2022-07-10 NOTE — Plan of Care (Signed)
Pt denies depression however, endorses experiencing some anxiety at this time. Pt denies SI/HI/AVH or pain at this time. Pt is calm and cooperative. Pt is medication compliant. Pt provided with support and encouragement. Pt monitored q15 minutes for safety per unit policy. Plan of care ongoing.   Problem: Elimination: Goal: Will not experience complications related to bowel motility Outcome: Progressing   Problem: Pain Managment: Goal: General experience of comfort will improve Outcome: Progressing

## 2022-07-10 NOTE — Progress Notes (Signed)
Patient currently in the dayroom with staff and peers. Pleasant on approach. Alert and oriented and asking about disposition.  Denying SI/HI/AVH. Received her ensure and had saltines for PM snack. No sign of distress noted. Encouragements and support provided. Safety monitored @ 1:1 level of observation.

## 2022-07-11 DIAGNOSIS — F332 Major depressive disorder, recurrent severe without psychotic features: Secondary | ICD-10-CM | POA: Diagnosis not present

## 2022-07-11 NOTE — BH IP Treatment Plan (Signed)
Interdisciplinary Treatment and Diagnostic Plan Update  07/11/2022 Time of Session: 9:30AM SOLVEIG FANGMAN MRN: [id number]  Principal Diagnosis: Major depressive disorder, recurrent episode, severe ([place])  Secondary Diagnoses: Principal Problem:   Major depressive disorder, recurrent episode, severe ([place]) Active Problems:   Pelvic fracture ([place])   Inferior pubic ramus fracture, left, closed, initial encounter ([place])   Current Medications:  Current Facility-Administered Medications  Medication Dose Route Frequency Provider Last Rate Last Admin   alum & mag hydroxide-simeth (MAALOX/MYLANTA) 200-200-20 MG/5ML suspension 30 mL  30 mL Oral Q4H PRN Parks Ranger, DO       aspirin EC tablet 81 mg  81 mg Oral Daily Parks Ranger, DO   81 mg at 07/11/22 6945   atorvastatin (LIPITOR) tablet 20 mg  20 mg Oral Daily Parks Ranger, DO   20 mg at 07/11/22 0388   cholecalciferol (VITAMIN D3) 25 MCG (1000 UNIT) tablet 1,000 Units  1,000 Units Oral Daily Parks Ranger, DO   1,000 Units at 07/11/22 8280   cyanocobalamin (VITAMIN B12) tablet 1,000 mcg  1,000 mcg Oral Daily Parks Ranger, DO   1,000 mcg at 07/11/22 0349   feeding supplement (ENSURE ENLIVE / ENSURE PLUS) liquid 237 mL  237 mL Oral TID BM Parks Ranger, DO   237 mL at 07/11/22 1791   haloperidol (HALDOL) tablet 1 mg  1 mg Oral QPC supper Parks Ranger, DO   1 mg at 07/10/22 1704   ibuprofen (ADVIL) tablet 400 mg  400 mg Oral Q6H PRN [name], [name], MD   400 mg at 07/10/22 1307   lip balm (BLISTEX) ointment   Topical PRN Parks Ranger, DO       LORazepam (ATIVAN) tablet 0.5 mg  0.5 mg Oral Q6H PRN Parks Ranger, DO   0.5 mg at 07/08/22 1432   magnesium hydroxide (MILK OF MAGNESIA) suspension 30 mL  30 mL Oral Daily PRN Parks Ranger, DO   30 mL at 06/24/22 1006   mirtazapine (REMERON) tablet 7.5 mg  7.5 mg Oral QHS Parks Ranger, DO    7.5 mg at 07/10/22 2157   modafinil (PROVIGIL) tablet 100 mg  100 mg Oral Daily Parks Ranger, DO   100 mg at 07/11/22 5056   multivitamin with minerals tablet 1 tablet  1 tablet Oral Daily Parks Ranger, DO   1 tablet at 07/11/22 0907   OLANZapine (ZYPREXA) tablet 5 mg  5 mg Oral QHS Parks Ranger, DO   5 mg at 07/10/22 2157   oxyCODONE-acetaminophen (PERCOCET) 7.5-325 MG per tablet 1 tablet  1 tablet Oral Q6H PRN [name], [name], MD   1 tablet at 07/11/22 0112   pantoprazole (PROTONIX) EC tablet 40 mg  40 mg Oral Daily Parks Ranger, DO   40 mg at 07/11/22 9794   polyethylene glycol (MIRALAX / GLYCOLAX) packet 17 g  17 g Oral Daily Parks Ranger, DO   17 g at 07/11/22 8016   traZODone (DESYREL) tablet 25 mg  25 mg Oral QHS PRN Parks Ranger, DO       venlafaxine XR (EFFEXOR-XR) 24 hr capsule 300 mg  300 mg Oral Q breakfast Parks Ranger, DO   300 mg at 07/11/22 5537   PTA Medications: Medications Prior to Admission  Medication Sig Dispense Refill Last Dose   albuterol (VENTOLIN HFA) 108 (90 Base) MCG/ACT inhaler Inhale 2 puffs into the lungs every 6 (six)  hours as needed for wheezing or shortness of breath. 8 g 2    aspirin EC 81 MG tablet Take 81 mg by mouth daily.      atorvastatin (LIPITOR) 20 MG tablet Take 1 tablet (20 mg total) by mouth daily. 90 tablet 1    cetirizine (ZYRTEC) 10 MG tablet Take 1 tablet (10 mg total) by mouth daily. 90 tablet 1    cholecalciferol (CHOLECALCIFEROL) 25 MCG tablet Take 1 tablet (1,000 Units total) by mouth daily.      cyanocobalamin (VITAMIN B12) 1000 MCG tablet Take 1 tablet (1,000 mcg total) by mouth daily.      denosumab (PROLIA) 60 MG/ML SOSY injection Inject 60 mg into the skin every 6 (six) months.      estradiol (ESTRACE) 0.1 MG/GM vaginal cream Discard plastic applicator. Insert blueberry size amount of cream on finger in vagina daily x1 week then 2x per week.      fluticasone  furoate-vilanterol (BREO ELLIPTA) 100-25 MCG/ACT AEPB Inhale 1 puff into the lungs daily. (Patient taking differently: Inhale 1 puff into the lungs daily as needed.) 60 each 5    glucose blood (ACCU-CHEK AVIVA PLUS) test strip Use to Monitor blood sugars daily. DX. E11.9 100 each 5    hydrochlorothiazide (MICROZIDE) 12.5 MG capsule Take 1 capsule (12.5 mg total) by mouth daily. 90 capsule 1    hydroxypropyl methylcellulose / hypromellose (ISOPTO TEARS / GONIOVISC) 2.5 % ophthalmic solution Place 2 drops into both eyes 3 (three) times daily as needed for dry eyes.      lidocaine-prilocaine (EMLA) cream Apply to affected area once (Patient taking differently: 1 Application as needed. Apply to affected area to port as needed) 30 g 3    losartan (COZAAR) 100 MG tablet TAKE 1 TABLET BY MOUTH ONCE A DAY 90 tablet 1    metoprolol succinate (TOPROL-XL) 50 MG 24 hr tablet Take 1 tablet (50 mg total) by mouth daily. Take with or immediately following a meal. 90 tablet 1    Multiple Vitamin (MULTIVITAMIN) capsule Take 1 capsule by mouth daily.      omeprazole (PRILOSEC) 20 MG capsule Take 1 capsule (20 mg total) by mouth daily. 90 capsule 1    venlafaxine XR (EFFEXOR XR) 75 MG 24 hr capsule Take 2 capsules (150 mg total) by mouth daily with breakfast. (Patient taking differently: Take 75 mg by mouth daily with breakfast.)       Patient Stressors:    Patient Strengths:    Treatment Modalities: Medication Management, Group therapy, Case management,  1 to 1 session with clinician, Psychoeducation, Recreational therapy.   Physician Treatment Plan for Primary Diagnosis: Major depressive disorder, recurrent episode, severe ([place]) Long Term Goal(s): Improvement in symptoms so as ready for discharge   Short Term Goals: Ability to identify changes in lifestyle to reduce recurrence of condition will improve Ability to verbalize feelings will improve Ability to disclose and discuss suicidal ideas Ability to  demonstrate self-control will improve Ability to identify and develop effective coping behaviors will improve Ability to maintain clinical measurements within normal limits will improve Compliance with prescribed medications will improve Ability to identify triggers associated with substance abuse/mental health issues will improve  Medication Management: Evaluate patient's response, side effects, and tolerance of medication regimen.  Therapeutic Interventions: 1 to 1 sessions, Unit Group sessions and Medication administration.  Evaluation of Outcomes: Progressing  Physician Treatment Plan for Secondary Diagnosis: Principal Problem:   Major depressive disorder, recurrent episode, severe ([place]) Active Problems:  Pelvic fracture ([facility])   Inferior pubic ramus fracture, left, closed, initial encounter ([place])  Long Term Goal(s): Improvement in symptoms so as ready for discharge   Short Term Goals: Ability to identify changes in lifestyle to reduce recurrence of condition will improve Ability to verbalize feelings will improve Ability to disclose and discuss suicidal ideas Ability to demonstrate self-control will improve Ability to identify and develop effective coping behaviors will improve Ability to maintain clinical measurements within normal limits will improve Compliance with prescribed medications will improve Ability to identify triggers associated with substance abuse/mental health issues will improve     Medication Management: Evaluate patient's response, side effects, and tolerance of medication regimen.  Therapeutic Interventions: 1 to 1 sessions, Unit Group sessions and Medication administration.  Evaluation of Outcomes: Progressing   RN Treatment Plan for Primary Diagnosis: Major depressive disorder, recurrent episode, severe ([place]) Long Term Goal(s): Knowledge of disease and therapeutic regimen to maintain health will improve  Short Term Goals: Ability to demonstrate  self-control, Ability to participate in decision making will improve, Ability to verbalize feelings will improve, Ability to disclose and discuss suicidal ideas, Ability to identify and develop effective coping behaviors will improve, and Compliance with prescribed medications will improve  Medication Management: RN will administer medications as ordered by provider, will assess and evaluate patient's response and provide education to patient for prescribed medication. RN will report any adverse and/or side effects to prescribing provider.  Therapeutic Interventions: 1 on 1 counseling sessions, Psychoeducation, Medication administration, Evaluate responses to treatment, Monitor vital signs and CBGs as ordered, Perform/monitor CIWA, COWS, AIMS and Fall Risk screenings as ordered, Perform wound care treatments as ordered.  Evaluation of Outcomes: Progressing   LCSW Treatment Plan for Primary Diagnosis: Major depressive disorder, recurrent episode, severe ([place]) Long Term Goal(s): Safe transition to appropriate next level of care at discharge, Engage patient in therapeutic group addressing interpersonal concerns.  Short Term Goals: Engage patient in aftercare planning with referrals and resources, Increase social support, Increase ability to appropriately verbalize feelings, Increase emotional regulation, Facilitate acceptance of mental health diagnosis and concerns, and Increase skills for wellness and recovery  Therapeutic Interventions: Assess for all discharge needs, 1 to 1 time with Social worker, Explore available resources and support systems, Assess for adequacy in community support network, Educate family and significant other(s) on suicide prevention, Complete Psychosocial Assessment, Interpersonal group therapy.  Evaluation of Outcomes: Progressing   Progress in Treatment: Attending groups: Yes. Participating in groups: Yes. Taking medication as prescribed: Yes. Toleration medication:  Yes. Family/Significant other contact made: Yes, individual(s) contacted:  SPE completed with the patients son Patient understands diagnosis: Yes. Discussing patient identified problems/goals with staff: Yes. Medical problems stabilized or resolved: Yes. Denies suicidal/homicidal ideation: Yes. Issues/concerns per patient self-inventory: No. Other: none  New problem(s) identified: No, Describe:  None   New Short Term/Long Term Goal(s): Patient to work towards elimination of symptoms of psychosis, medication management for mood stabilization; development of comprehensive mental wellness plan.  Update 07/11/2022:  No changes at this time.   New Short Term/Long Term Goal(s): Patient to work towards medication management for mood stabilization; elimination of SI thoughts; development of comprehensive mental wellness plan. Update 06/30/22: No changes at this time. Update 07/05/22: No changes at this time  Update 07/11/2022:  No changes at this time.   Patient Goals: No additional goals identified at this time. Patient to continue to work towards original goals identified in initial treatment team meeting. CSW will remain available to patient  should they voice additional treatment goals. Update 06/30/22: No changes at this time. Update 07/05/22: No changes at this time Update 07/11/2022:  No changes at this time.   Discharge Plan or Barriers: No psychosocial barriers identified at this time, patient to return to place of residence when appropriate for discharge. Update 06/30/22: PT/OT recommended pt discharge to SNF, CSW spoke with pt's son who stated he was interested in taking pt to his home instead of SNF at discharge and finding care for pt from there. Update 07/05/22: Patient had a fall. SNF recommended. Referral for SNF facilities sent, pt discharge pending placement and provider statement of readiness. Update 07/11/2022:  Patient has been recommended for SNF at this time.  CSW continues to look for  appropriate placement. Should appropriate bed not be found CSW to assist with development of alternative plan.   Reason for Continuation of Hospitalization: Depression Medication stabilization   Estimated Length of Stay: TBD Update 07/11/2022:  TBD  Last 3 [place] Suicide Severity Risk Score: Flowsheet Row Admission (Current) from 06/14/2022 in [facility] from 06/10/2022 in [facility] Admission (Discharged) from 09/03/2020 in [facility] No Risk No Risk No Risk       Last PHQ 2/9 Scores:    08/10/2020    9:35 AM 01/13/2020    3:59 PM 07/16/2019    9:36 AM  Depression screen PHQ 2/9  Decreased Interest 0 0 0  Down, Depressed, Hopeless 0 0 0  PHQ - 2 Score 0 0 0    Scribe for Treatment Team: [name], Marlinda Mike 07/11/2022 9:31 AM

## 2022-07-11 NOTE — Progress Notes (Signed)
Patient denies SI, HI, and AVH. She says her depression is improving. She does have some anxiety over "knowing what is real and what is not".  However, she says she does not feel she has had any hallucinations today. She reports 6/10 pain in her left pelvis. Percocet given with scheduled medications. Patient is compliant with scheduled medications. Patient remains on 1:1 for safety.

## 2022-07-11 NOTE — Group Note (Signed)
LCSW Group Therapy Note  Group Date: 07/11/2022 Start Time: 8184 End Time: 1350   Type of Therapy and Topic:  Group Therapy - How To Cope with Nervousness about Discharge   Participation Level:  Active   Description of Group This process group involved identification of patients' feelings about discharge. Some of them are scheduled to be discharged soon, while others are new admissions, but each of them was asked to share thoughts and feelings surrounding discharge from the hospital. One common theme was that they are excited at the prospect of going home, while another was that many of them are apprehensive about sharing why they were hospitalized. Patients were given the opportunity to discuss these feelings with their peers in preparation for discharge.  Therapeutic Goals  Patient will identify their overall feelings about pending discharge. Patient will think about how they might proactively address issues that they believe will once again arise once they get home (i.e. with parents). Patients will participate in discussion about having hope for change.   Summary of Patient Progress:  Patient was very active throughout the session. Patient demonstrated good insight into the subject matter, and proved open to input from peers and feedback from [place]. Patient was respectful of peers and participated throughout the entire session.   Therapeutic Modalities Cognitive Behavioral Therapy   Maryjane Hurter 07/11/2022  1:51 PM

## 2022-07-11 NOTE — Progress Notes (Signed)
Physical Therapy Treatment Patient Details Name: Cathy [name] MRN: [id number] DOB: 1946/01/28 Today's Date: 07/11/2022   History of Present Illness Pt is a 77 y.o. female presenting "voluntarily to [facility] reporting worsening depressive symptoms and suicidal ideation" 06/10/22; transferred to [facility] 06/14/22.  Unwitnessed fall 06/18/22 hitting head. Pt now with fall 07/01/22 and diagnosed with left inferior superior pubic ramus fracture and is WBAT per ortho consult note on 1/20.   PMH includes htn, vision changes, L TKA, lymphoma, depression, anxiety.    PT Comments    Pt was pleasant and motivated to participate during the session and put forth good effort throughout. Pt demonstrated grossly improved static and dynamic standing balance compared to prior session but continued to require occasional min A during ambulation and during below balance exercises to prevent LOB.  Pt remains at an elevated risk for falling and will benefit from PT services in a SNF setting upon discharge to safely address deficits listed in patient problem list for decreased caregiver assistance and eventual return to PLOF.     Recommendations for follow up therapy are one component of a multi-disciplinary discharge planning process, led by the attending physician.  Recommendations may be updated based on patient status, additional functional criteria and insurance authorization.  Follow Up Recommendations  Skilled nursing-short term rehab (<3 hours/day) Can patient physically be transported by private vehicle: Yes   Assistance Recommended at Discharge Frequent or constant Supervision/Assistance  Patient can return home with the following A little help with walking and/or transfers;A little help with bathing/dressing/bathroom;Assistance with cooking/housework;Direct supervision/assist for medications management;Direct supervision/assist for financial management;Assist for transportation;Help with stairs or ramp for  entrance   Equipment Recommendations  Rolling walker (2 wheels)    Recommendations for Other Services       Precautions / Restrictions Precautions Precautions: Fall Restrictions Weight Bearing Restrictions: Yes LLE Weight Bearing: Weight bearing as tolerated     Mobility  Bed Mobility               General bed mobility comments: NT, pt in recliner    Transfers Overall transfer level: Needs assistance Equipment used: Rolling walker (2 wheels) Transfers: Sit to/from Stand Sit to Stand: Supervision           General transfer comment: Min verbal cues for hand placement with fair eccentric and concentric control    Ambulation/Gait Ambulation/Gait assistance: Min assist Gait Distance (Feet): 100 Feet x 2 Assistive device: Rolling walker (2 wheels) Gait Pattern/deviations: Step-through pattern, Antalgic, Decreased stance time - left Gait velocity: decreased     General Gait Details: Mildly antalgic gait on the LLE with one instance of min A to prevent posterior LOB during a 90 deg turn; min to mod verbal cues for staying closer/within the RW during slow sharp turns   Marine scientist [place] (Stroke Patients Only)       Balance Overall balance assessment: Needs assistance, History of Falls Sitting-balance support: No upper extremity supported, Feet supported Sitting balance-Leahy Scale: Normal     Standing balance support: During functional activity, No upper extremity supported, Bilateral upper extremity supported Standing balance-Leahy Scale: Poor                              Cognition Arousal/Alertness: Awake/alert Behavior During Therapy: WFL for tasks assessed/performed Overall Cognitive Status: Within Functional Limits for tasks  assessed                                          Exercises Other Exercises: Standing mini-squats 2 x 5 reps Other Exercises: Dynamic unsupported  standing balance training with reaching outside BOS with feet apart, together, and semi-tandem Other Exercises: Static unsupported standing balance training with feet apart, together, and semi-tandem with combinations of eyes open/closed and head still/head turns    General Comments        Pertinent Vitals/Pain Pain Assessment Pain Assessment: 0-10 Pain Score: 4  Pain Location: L hip Pain Descriptors / Indicators: Sore Pain Intervention(s): Premedicated before session, Monitored during session    Home Living                          Prior Function            PT Goals (current goals can now be found in the care plan section) Progress towards PT goals: Progressing toward goals    Frequency    Min 2X/week      PT Plan Current plan remains appropriate    Co-evaluation              AM-PAC PT "6 Clicks" Mobility   Outcome Measure  Help needed turning from your back to your side while in a flat bed without using bedrails?: A Little Help needed moving from lying on your back to sitting on the side of a flat bed without using bedrails?: A Little Help needed moving to and from a bed to a chair (including a wheelchair)?: A Little Help needed standing up from a chair using your arms (e.g., wheelchair or bedside chair)?: A Little Help needed to walk in hospital room?: A Little Help needed climbing 3-5 steps with a railing? : A Little 6 Click Score: 18    End of Session Equipment Utilized During Treatment: Gait belt Activity Tolerance: Patient tolerated treatment well Patient left: in chair;with nursing/sitter in room Nurse Communication: Mobility status PT Visit Diagnosis: Unsteadiness on feet (R26.81);History of falling (Z91.81);Other abnormalities of gait and mobility (R26.89);Muscle weakness (generalized) (M62.81);Pain Pain - Right/Left: Left Pain - part of body: Hip     Time: 3710-6269 PT Time Calculation (min) (ACUTE ONLY): 27 min  Charges:  $Gait  Training: 8-22 mins $Therapeutic Exercise: 8-22 mins                     D. Scott Mae Denunzio PT, DPT 07/11/22, 11:52 AM

## 2022-07-11 NOTE — Plan of Care (Signed)
Pt endorses anxiety however denies depression at this time. Pt denies SI/HI/AVH or pain at this time. Pt is calm and cooperative. Pt is medication compliant. Pt provided with support and encouragement. Pt monitored q15 minutes for safety per unit policy. Plan of care ongoing.   Problem: Pain Managment: Goal: General experience of comfort will improve Outcome: Progressing   Problem: Coping: Goal: Level of anxiety will decrease Outcome: Not Progressing

## 2022-07-11 NOTE — Progress Notes (Signed)
[facility] MD Progress Note  07/11/2022 12:33 PM Cathy [name]  MRN:  [id number] Subjective: Cathy [name] is seen on rounds.  She had a good night last night.  She has been working with physical therapy also.  She has been compliant with medications.  No reports of delusions or hallucinations last evening.  I started her on a low-dose of Haldol 1 mg at 6:00.  It seems as though it might have helped.  No side effects.  Principal Problem: Major depressive disorder, recurrent episode, severe (HCC) Diagnosis: Principal Problem:   Major depressive disorder, recurrent episode, severe (HCC) Active Problems:   Pelvic fracture (HCC)   Inferior pubic ramus fracture, left, closed, initial encounter ([place])  Total Time spent with patient: 15 minutes  Past Psychiatric History: She has been on Lexapro and Trintellix,  prescribed by her PCP. Recent trial of Wellbutrin. No past psychiatric hospitalizations and no past suicide attempts.     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   Vision changes     Past Surgical History:  Procedure Laterality Date   CHOLECYSTECTOMY     IR IMAGING GUIDED PORT INSERTION  09/21/2020   LITHOTRIPSY  1984   LYMPH NODE BIOPSY     MASS BIOPSY Left 09/03/2020   Procedure: LEFT NECK JUGULAR NODE;  Surgeon: Rozetta Nunnery, MD;  Location: [place];  Service: ENT;  Laterality: Left;   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   Procedure:  LEFT TOTAL KNEE ARTHROPLASTY;  Surgeon: Gaynelle Arabian, MD;  Location: [facility];  Service: Orthopedics;  Laterality: Left;   Family History:  Family History  Problem Relation Age of Onset   Hypertension Mother    Stroke Mother    Heart attack Mother        CABG, 5 Stents   Dementia Mother    Cancer Father        Kidney   Asthma Sister    Diabetes Brother        Borderline   Alcohol abuse Brother    Cancer Maternal Aunt        stomach ca   Cancer Maternal Aunt        ovarian ca   Breast cancer Cousin    Colon cancer Neg Hx     Social History:  Social History   Substance and Sexual Activity  Alcohol Use No     Social History   Substance and Sexual Activity  Drug Use No    Social History   Socioeconomic History   Marital status: Widowed    Spouse name: Not on file   Number of children: Not on file   Years of education: Not on file   Highest education level: Not on file  Occupational History   Not on file  Tobacco Use   Smoking status: Never   Smokeless tobacco: Never  Substance and Sexual Activity   Alcohol use: No   Drug use: No   Sexual activity: Not Currently    Birth control/protection: Post-menopausal  Other Topics Concern   Not on file  Social History Narrative   Lives alone.     Son is [name].     Social Determinants of Health   Financial Resource Strain: Not on file  Food Insecurity: No Food Insecurity (06/14/2022)   Hunger Vital Sign    Worried About Running Out of Food in the Last Year: Never true    Ran Out of Food in the Last Year: Never true  Transportation Needs: No Transportation Needs (06/14/2022)   PRAPARE - Hydrologist (Medical): No    Lack of Transportation (Non-Medical): No  Physical Activity: Not on file  Stress: Not on file  Social Connections: Not on file   Additional Social History:                         Sleep: Good  Appetite:  Fair  Current Medications: Current  Facility-Administered Medications  Medication Dose Route Frequency Provider Last Rate Last Admin   alum & mag hydroxide-simeth (MAALOX/MYLANTA) 200-200-20 MG/5ML suspension 30 mL  30 mL Oral Q4H PRN Parks Ranger, DO       aspirin EC tablet 81 mg  81 mg Oral Daily Parks Ranger, DO   81 mg at 07/11/22 7591   atorvastatin (LIPITOR) tablet 20 mg  20 mg Oral Daily Parks Ranger, DO   20 mg at 07/11/22 6384   cholecalciferol (VITAMIN D3) 25 MCG (1000 UNIT) tablet 1,000 Units  1,000 Units Oral Daily Parks Ranger, DO   1,000 Units at 07/11/22 6659   cyanocobalamin (VITAMIN B12) tablet 1,000 mcg  1,000 mcg Oral Daily Parks Ranger, DO   1,000 mcg at 07/11/22 9357   feeding supplement (ENSURE ENLIVE / ENSURE PLUS) liquid 237 mL  237 mL Oral TID BM Parks Ranger, DO   237 mL at 07/11/22 0177   haloperidol (HALDOL) tablet 1 mg  1 mg Oral QPC supper Parks Ranger, DO   1 mg at 07/10/22 1704   ibuprofen (ADVIL) tablet 400 mg  400 mg Oral Q6H PRN [name], [name], MD   400 mg at 07/10/22 1307   lip balm (BLISTEX) ointment   Topical PRN Parks Ranger, DO       LORazepam (ATIVAN) tablet 0.5 mg  0.5 mg Oral Q6H PRN Parks Ranger, DO   0.5 mg at 07/08/22 1432   magnesium hydroxide (MILK OF MAGNESIA) suspension 30 mL  30 mL Oral Daily PRN Parks Ranger, DO   30 mL at 06/24/22 1006   mirtazapine (REMERON) tablet 7.5 mg  7.5 mg Oral QHS Parks Ranger, DO   7.5 mg at 07/10/22 2157   modafinil (PROVIGIL) tablet 100 mg  100 mg Oral Daily Parks Ranger, DO   100 mg at 07/11/22 9390   multivitamin with minerals tablet 1 tablet  1 tablet Oral Daily Parks Ranger, DO   1 tablet at 07/11/22 0907   OLANZapine (ZYPREXA) tablet 5 mg  5 mg Oral QHS Parks Ranger, DO   5 mg at 07/10/22 2157   oxyCODONE-acetaminophen (PERCOCET) 7.5-325 MG per tablet 1 tablet  1 tablet Oral Q6H PRN [name], [name], MD   1 tablet at 07/11/22 0112   pantoprazole (PROTONIX)  EC tablet 40 mg  40 mg Oral Daily Parks Ranger, DO   40 mg at 07/11/22 8185   polyethylene glycol (MIRALAX / GLYCOLAX) packet 17 g  17 g Oral Daily Parks Ranger, DO   17 g at 07/11/22 6314   traZODone (DESYREL) tablet 25 mg  25 mg Oral QHS PRN Parks Ranger, DO       venlafaxine XR (EFFEXOR-XR) 24 hr capsule 300 mg  300 mg Oral Q breakfast Parks Ranger, DO   300 mg at 07/11/22 9702    Lab Results: No results found for this or any previous visit (from the past 48 hour(s)).  Blood Alcohol level:  Lab Results  Component Value Date   ETH <10 63/78/5885    Metabolic Disorder Labs: Lab Results  Component Value Date   HGBA1C 5.5 06/10/2022   MPG 111.15 06/10/2022   MPG 105 04/05/2021   No results found for: "PROLACTIN" Lab Results  Component Value Date   CHOL 246 (H) 06/10/2022   TRIG 94 06/10/2022   HDL 57 06/10/2022   CHOLHDL 4.3 06/10/2022   VLDL 19 06/10/2022   LDLCALC 170 (H) 06/10/2022   LDLCALC 67 04/05/2021    Physical Findings: AIMS:  , ,  ,  ,    CIWA:    COWS:     Musculoskeletal: Strength & Muscle Tone: within normal limits Gait & Station: unsteady Patient leans: N/A  Psychiatric Specialty Exam:  Presentation  General Appearance:  Appropriate for Environment; Casual  Eye Contact: Good  Speech: Clear and Coherent; Normal Rate  Speech Volume: Normal  Handedness: Right   Mood and Affect  Mood: Dysphoric  Affect: Congruent   Thought Process  Thought Processes: Coherent  Descriptions of Associations:Intact  Orientation:Full (Time, Place and Person)  Thought Content:Logical  History of Schizophrenia/Schizoaffective disorder:No  Duration of Psychotic Symptoms:No data recorded Hallucinations:No data recorded Ideas of Reference:None  Suicidal Thoughts:No data recorded Homicidal Thoughts:No data recorded  Sensorium   Memory: Immediate Good; Recent Good; Remote Good  Judgment: Good  Insight: Good   Executive Functions  Concentration: Good  Attention Span: Good  Recall: Good  Fund of Knowledge: Good  Language: Good   Psychomotor Activity  Psychomotor Activity:No data recorded  Assets  Assets: Physical Health; Resilience; Social Support; Catering manager; Housing   Sleep  Sleep:No data recorded   Physical Exam: Physical Exam Vitals and nursing note reviewed.  Constitutional:      Appearance: Normal appearance. She is normal weight.  Neurological:     General: No focal deficit present.     Mental Status: She is alert and oriented to person, place, and time.  Psychiatric:        Mood and Affect: Mood is depressed. Affect is flat.        Speech: Speech normal.        Behavior: Behavior normal. Behavior is cooperative.        Thought Content: Thought content normal.        Cognition and Memory: Cognition and memory normal.        Judgment: Judgment normal.    Review of Systems  Constitutional: Negative.   HENT: Negative.    Eyes: Negative.   Respiratory: Negative.    Cardiovascular: Negative.   Gastrointestinal: Negative.   Genitourinary: Negative.   Musculoskeletal: Negative.   Skin: Negative.   Neurological: Negative.   Endo/Heme/Allergies: Negative.   Psychiatric/Behavioral:  Positive for depression.    Blood pressure 128/63, pulse 84, temperature (!)  97.5 F (36.4 C), resp. rate 17, height '5\' 2"'$  (1.575 m), SpO2 100 %. Body mass index is 20.01 kg/m.   Treatment Plan Summary: Daily contact with patient to assess and evaluate symptoms and progress in treatment, Medication management, and Plan continue current medications.  Parks Ranger, DO 07/11/2022, 12:33 PM

## 2022-07-11 NOTE — Progress Notes (Signed)
1:1 Hourly Rounding  0730: Pt in bedroom sleeping, raise and fall of chx can be observed, breathing is unlabored and regular, sitter at side.  0830: Pt in dayroom calm and composed with sitter at side.  0930: Pt in dayroom calm and composed with sitter at side.  1030: Pt in dayroom calm and composed with sitter at side.  1130: Pt in dayroom calm and composed with sitter at side.  1230: Pt in dayroom calm and composed with sitter at side.  1330: Pt in dayroom calm and composed with sitter at side.  1430: Pt in bedroom calm and composed with sitter at side.  1530: Pt in bedroom calm and composed with sitter at side.  1630: Pt in dayroom calm and composed with sitter at side.  1730: Pt in dayroom calm and composed with sitter at side.  1830: Pt in dayroom calm and composed with sitter at side.  1900: Pt in dayroom with visitor visiting, calm and composed, with sitter present.

## 2022-07-11 NOTE — Progress Notes (Incomplete)
1:1 hourly rounding  7pm: Patient is sitting in the dayroom talking to a visitor.  830pm: Patient is sitting in the dayroom watching TV and eating snack.   920pm: Patient is in bed awake. Patient requests pain medication before bedtime. 1:1 sitter at bedside.  1030pm: Patient is in bed sleeping. 1:1 sitter at bedside.  11pm: Patient is in bed sleeping. 1:1 sitter at bedside.  12am: Patient is in bed sleeping. 1:1 sitter at bedside.  1am: Patient is in bed sleeping. 1:1 sitter at bedside.  215am: Patient is in bed sleeping. 1:1 sitter at bedside.  320am: Patient is in bed sleeping. 1:1 sitter at bedside.  4am:Patient is in bed sleeping. 1:1 sitter at bedside.  5am: Patient is in bed sleeping. 1:1 sitter at bedside.  6am: Patient is in bed sleeping. 1:1 sitter at bedside.  640am: Patient is sitting in the dayroom watching TV. 1:1 sitter at bedside

## 2022-07-11 NOTE — Final Progress Note (Signed)
Patient in bed asleep, no sign of discomfort.

## 2022-07-11 NOTE — Progress Notes (Signed)
Patient has remained asleep, eyes closed, respirations WNL. No sign of distress noted. Safety precautions maintained on 1:1.

## 2022-07-11 NOTE — Progress Notes (Signed)
Patient in bed asleep. Remains on 1:1 for safety.

## 2022-07-11 NOTE — Progress Notes (Signed)
Patient currently in bed sleeping  with no sign of distress. Safety monitored on 1:1

## 2022-07-12 DIAGNOSIS — F332 Major depressive disorder, recurrent severe without psychotic features: Secondary | ICD-10-CM | POA: Diagnosis not present

## 2022-07-12 MED ORDER — MODAFINIL 100 MG PO TABS
100.0000 mg | ORAL_TABLET | Freq: Once | ORAL | Status: AC
  Administered 2022-07-12: 100 mg via ORAL
  Filled 2022-07-12: qty 1

## 2022-07-12 MED ORDER — MODAFINIL 100 MG PO TABS
200.0000 mg | ORAL_TABLET | Freq: Every day | ORAL | Status: DC
  Administered 2022-07-13 – 2022-07-27 (×15): 200 mg via ORAL
  Filled 2022-07-12 (×15): qty 2

## 2022-07-12 NOTE — Progress Notes (Incomplete)
Patient denies SI, HI, and AVH. Patient is having delusions that her sister is taking her items. During visit with her sister, patient is tearful and thinks that she is going to be taken away from the hospital and transferred somewhere else. Patient needed to be reassured by staff. She said she did not have a good day because she was having so much anxiety. She says, "why can't I get right." Support and encouragement given. Patient is compliant with scheduled medications. Patient remains safe on the unit at this time with 1:1 sitter for safety.   1:1 hourly rounding  730pm: Patient is in the dayroom, anxious and tearful. She believes she is going to get transferred to another facility. Patient is reassured that she will be staying in the hospital tonight.   8pm: Patient is sitting in the dayroom eating snack.   915pm: Patient reports feeling better. She is sitting in the dayroom watching TV in a geri chair.   10pm: Patient was brought to the bathroom by this writer and NT.   11pm: Patient is in bed awake. 1:1 sitter at side.  12am: Patient is in bed asleep. 1:1 sitter at side.  1am: Patient is in bed asleep.Marland Kitchen 1:1 sitter at side.  2am: Patient is in bed asleep.Marland Kitchen 1:1 sitter at side.  3am: Patient is in bed asleep.Marland Kitchen 1:1 sitter at side.  4am: Patient is in bed asleep.Marland Kitchen 1:1 sitter at side.  5am: Patient is in bed asleep. 1:1 sitter at side.  6am: Patient is in bed asleep. 1:1 sitter at side.

## 2022-07-12 NOTE — Progress Notes (Signed)
1:1   7am- patient in the dayroom 8am- patient in the dayroom, patient 3 bites of eggs 9am- patient in the dayroom 10am- patient in the dayroom; patient in the dayroom asleep  11am- patient in the dayroom asleep 12pm- patient in the dayroom  1pm- Patient in the dayroom 2pm- patient in the dayroom 3pm- patient in the dayroom 4pm- patient in the dayroom  5pm- patient in the dayroom 6pm- patient in the dayroom 7pm- patient in the dayroom with vistor

## 2022-07-12 NOTE — [facility] Group Notes (Signed)
Patient attended group. Patient was engaged in group discussion.

## 2022-07-12 NOTE — Plan of Care (Signed)
D- Patient alert and oriented x 3. Patient presents with a pleasant mood and affect. Patient smiling and conversing with this Probation officer.  Patient denies SI, HI, AVH, and pain. Patient ate 5% of breakfast, 30% of lunch, 30% of dinner.   A- Scheduled medications administered to patient, per MD orders. Support and encouragement provided.  Routine safety checks conducted every 15 minutes.  Patient informed to notify staff with problems or concerns.  R- No adverse drug reactions noted. Patient contracts for safety at this time. Patient compliant with medications and treatment plan. Patient receptive, calm, and cooperative. Patient interacts well with others on the unit.  Patient remains safe at this time.  Problem: Education: Goal: Knowledge of General Education information will improve Description: Including pain rating scale, medication(s)/side effects and non-pharmacologic comfort measures Outcome: Not Progressing   Problem: Clinical Measurements: Goal: Ability to maintain clinical measurements within normal limits will improve Outcome: Progressing Goal: Will remain free from infection Outcome: Progressing Goal: Diagnostic test results will improve Outcome: Progressing Goal: Respiratory complications will improve Outcome: Progressing Goal: Cardiovascular complication will be avoided Outcome: Progressing   Problem: Activity: Goal: Risk for activity intolerance will decrease Outcome: Progressing   Problem: Nutrition: Goal: Adequate nutrition will be maintained Outcome: Not Progressing   Problem: Elimination: Goal: Will not experience complications related to bowel motility Outcome: Progressing

## 2022-07-12 NOTE — Progress Notes (Signed)
[facility] MD Progress Note  07/12/2022 11:16 AM Cathy [name]  MRN:  [id number] Subjective: Cathy [name] is seen on rounds.  She is able to smile when I talked to her.  She says that she lacks energy and concentration.  Nurses tell me that she has not been eating much and refuses her Ensure.  I talked to her about Ensure and encouraged her to take it and she says she would.  I spoke with Dr. Weber Cooks about evaluating her for ECT and he was going to do that.  In the meantime, medical up on her Provigil to 200 mg/day.  Social work continues to look for SNF.  Nurses report that she slept well and did not have any episodes of delusions or hallucinations.  Principal Problem: Major depressive disorder, recurrent episode, severe (HCC) Diagnosis: Principal Problem:   Major depressive disorder, recurrent episode, severe (HCC) Active Problems:   Pelvic fracture (HCC)   Inferior pubic ramus fracture, left, closed, initial encounter ([place])  Total Time spent with patient: 15 minutes  Past Psychiatric History: She has been on Lexapro and Trintellix,  prescribed by her PCP. Recent trial of Wellbutrin. No past psychiatric hospitalizations and no past suicide attempts.      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   Vision changes     Past Surgical History:  Procedure Laterality Date   CHOLECYSTECTOMY     IR IMAGING GUIDED PORT INSERTION  09/21/2020   LITHOTRIPSY  1984   LYMPH NODE  BIOPSY     MASS BIOPSY Left 09/03/2020   Procedure: LEFT NECK JUGULAR NODE;  Surgeon: Rozetta Nunnery, MD;  Location: [place];  Service: ENT;  Laterality: Left;   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   Procedure: LEFT TOTAL KNEE ARTHROPLASTY;  Surgeon: Gaynelle Arabian, MD;  Location: [facility];  Service: Orthopedics;  Laterality: Left;   Family History:  Family History  Problem Relation Age of Onset   Hypertension Mother    Stroke Mother    Heart attack Mother        CABG, 5 Stents   Dementia Mother    Cancer Father        Kidney   Asthma Sister    Diabetes Brother        Borderline   Alcohol abuse Brother    Cancer Maternal Aunt        stomach ca   Cancer Maternal Aunt        ovarian ca   Breast cancer Cousin    Colon cancer Neg Hx    Family Psychiatric  History: Unremarkable Social History:  Social History   Substance and Sexual Activity  Alcohol Use No     Social History   Substance and Sexual Activity  Drug Use No    Social History   Socioeconomic History   Marital status:  Widowed    Spouse name: Not on file   Number of children: Not on file   Years of education: Not on file   Highest education level: Not on file  Occupational History   Not on file  Tobacco Use   Smoking status: Never   Smokeless tobacco: Never  Substance and Sexual Activity   Alcohol use: No   Drug use: No   Sexual activity: Not Currently    Birth control/protection: Post-menopausal  Other Topics Concern   Not on file  Social History Narrative   Lives alone.     Son is [name].     Social Determinants of Health   Financial Resource Strain: Not on file  Food Insecurity: No Food Insecurity (06/14/2022)   Hunger Vital Sign    Worried About Running Out of Food in the Last Year: Never true    Ran Out of Food in the Last Year: Never true  Transportation Needs: No Transportation Needs (06/14/2022)   PRAPARE - Hydrologist  (Medical): No    Lack of Transportation (Non-Medical): No  Physical Activity: Not on file  Stress: Not on file  Social Connections: Not on file   Additional Social History:                         Sleep: Good  Appetite:  Poor  Current Medications: Current Facility-Administered Medications  Medication Dose Route Frequency Provider Last Rate Last Admin   alum & mag hydroxide-simeth (MAALOX/MYLANTA) 200-200-20 MG/5ML suspension 30 mL  30 mL Oral Q4H PRN Parks Ranger, DO       aspirin EC tablet 81 mg  81 mg Oral Daily Parks Ranger, DO   81 mg at 07/12/22 0900   atorvastatin (LIPITOR) tablet 20 mg  20 mg Oral Daily Parks Ranger, DO   20 mg at 07/12/22 0900   cholecalciferol (VITAMIN D3) 25 MCG (1000 UNIT) tablet 1,000 Units  1,000 Units Oral Daily Parks Ranger, DO   1,000 Units at 07/12/22 0900   cyanocobalamin (VITAMIN B12) tablet 1,000 mcg  1,000 mcg Oral Daily Parks Ranger, DO   1,000 mcg at 07/12/22 0900   feeding supplement (ENSURE ENLIVE / ENSURE PLUS) liquid 237 mL  237 mL Oral TID BM Parks Ranger, DO   237 mL at 07/12/22 1779   haloperidol (HALDOL) tablet 1 mg  1 mg Oral QPC supper Parks Ranger, DO   1 mg at 07/11/22 1715   ibuprofen (ADVIL) tablet 400 mg  400 mg Oral Q6H PRN [name], [name], MD   400 mg at 07/10/22 1307   lip balm (BLISTEX) ointment   Topical PRN Parks Ranger, DO       LORazepam (ATIVAN) tablet 0.5 mg  0.5 mg Oral Q6H PRN Parks Ranger, DO   0.5 mg at 07/08/22 1432   magnesium hydroxide (MILK OF MAGNESIA) suspension 30 mL  30 mL Oral Daily PRN Parks Ranger, DO   30 mL at 06/24/22 1006   mirtazapine (REMERON) tablet 7.5 mg  7.5 mg Oral QHS Parks Ranger, DO   7.5 mg at 07/11/22 2127   modafinil (PROVIGIL) tablet 100 mg  100 mg Oral Once Parks Ranger, DO       [START ON 07/13/2022] modafinil (PROVIGIL) tablet 200 mg  200 mg Oral  Daily Parks Ranger, DO       multivitamin  with minerals tablet 1 tablet  1 tablet Oral Daily Parks Ranger, DO   1 tablet at 07/12/22 0900   OLANZapine (ZYPREXA) tablet 5 mg  5 mg Oral QHS Parks Ranger, DO   5 mg at 07/11/22 2127   oxyCODONE-acetaminophen (PERCOCET) 7.5-325 MG per tablet 1 tablet  1 tablet Oral Q6H PRN [name], [name], MD   1 tablet at 07/11/22 2127   pantoprazole (PROTONIX) EC tablet 40 mg  40 mg Oral Daily Parks Ranger, DO   40 mg at 07/12/22 0900   polyethylene glycol (MIRALAX / GLYCOLAX) packet 17 g  17 g Oral Daily Parks Ranger, DO   17 g at 07/12/22 0901   traZODone (DESYREL) tablet 25 mg  25 mg Oral QHS PRN Parks Ranger, DO       venlafaxine XR (EFFEXOR-XR) 24 hr capsule 300 mg  300 mg Oral Q breakfast Parks Ranger, DO   300 mg at 07/12/22 0900    Lab Results: No results found for this or any previous visit (from the past 48 hour(s)).  Blood Alcohol level:  Lab Results  Component Value Date   ETH <10 34/19/6222    Metabolic Disorder Labs: Lab Results  Component Value Date   HGBA1C 5.5 06/10/2022   MPG 111.15 06/10/2022   MPG 105 04/05/2021   No results found for: "PROLACTIN" Lab Results  Component Value Date   CHOL 246 (H) 06/10/2022   TRIG 94 06/10/2022   HDL 57 06/10/2022   CHOLHDL 4.3 06/10/2022   VLDL 19 06/10/2022   LDLCALC 170 (H) 06/10/2022   LDLCALC 67 04/05/2021    Physical Findings: AIMS:  , ,  ,  ,    CIWA:    COWS:     Musculoskeletal: Strength & Muscle Tone: within normal limits Gait & Station: unsteady, unable to stand Patient leans: N/A  Psychiatric Specialty Exam:  Presentation  General Appearance:  Appropriate for Environment; Casual  Eye Contact: Good  Speech: Clear and Coherent; Normal Rate  Speech Volume: Normal  Handedness: Right   Mood and Affect  Mood: Dysphoric  Affect: Congruent   Thought Process  Thought  Processes: Coherent  Descriptions of Associations:Intact  Orientation:Full (Time, Place and Person)  Thought Content:Logical  History of Schizophrenia/Schizoaffective disorder:No  Duration of Psychotic Symptoms:No data recorded Hallucinations:No data recorded Ideas of Reference:None  Suicidal Thoughts:No data recorded Homicidal Thoughts:No data recorded  Sensorium  Memory: Immediate Good; Recent Good; Remote Good  Judgment: Good  Insight: Good   Executive Functions  Concentration: Good  Attention Span: Good  Recall: Good  Fund of Knowledge: Good  Language: Good   Psychomotor Activity  Psychomotor Activity:No data recorded  Assets  Assets: Physical Health; Resilience; Social Support; Catering manager; Housing   Sleep  Sleep:No data recorded   Physical Exam: Physical Exam Vitals and nursing note reviewed.  Constitutional:      Appearance: Normal appearance. She is normal weight.  Neurological:     General: No focal deficit present.     Mental Status: She is alert and oriented to person, place, and time.  Psychiatric:        Attention and Perception: Attention and perception normal.        Mood and Affect: Mood is depressed. Affect is flat.        Speech: Speech normal.        Behavior: Behavior normal. Behavior is cooperative.        Thought Content: Thought content  normal.        Cognition and Memory: Cognition and memory normal.        Judgment: Judgment normal.    Review of Systems  Constitutional: Negative.   HENT: Negative.    Eyes: Negative.   Respiratory: Negative.    Cardiovascular: Negative.   Gastrointestinal: Negative.   Genitourinary: Negative.   Musculoskeletal: Negative.   Skin: Negative.   Neurological: Negative.   Endo/Heme/Allergies: Negative.   Psychiatric/Behavioral:  Positive for depression.    Blood pressure (!) 108/59, pulse 87, temperature 98.8 F (37.1 C), temperature source Oral, resp. rate  17, height '5\' 2"'$  (1.575 m), SpO2 100 %. Body mass index is 20.01 kg/m.   Treatment Plan Summary: Daily contact with patient to assess and evaluate symptoms and progress in treatment, Medication management, and Plan ECT consult.  Increase Provigil to 200 mg/day.  Parks Ranger, DO 07/12/2022, 11:16 AM

## 2022-07-13 DIAGNOSIS — F332 Major depressive disorder, recurrent severe without psychotic features: Secondary | ICD-10-CM | POA: Diagnosis not present

## 2022-07-13 MED ORDER — PROSOURCE PLUS PO LIQD
30.0000 mL | Freq: Three times a day (TID) | ORAL | Status: DC
  Administered 2022-07-13 – 2022-07-17 (×7): 30 mL via ORAL

## 2022-07-13 NOTE — B[facility] Counselor (Signed)
CSW attempted to contact Tonya at [facility], [place] [phone].  CSW to attempt again.  CSW unable to leave voicemail as mailbox was full.  Assunta Curtis, MSW, LCSW 07/13/2022 2:02 PM

## 2022-07-13 NOTE — Group Note (Signed)
[facility] LCSW Group Therapy Note   Group Date: 07/13/2022 Start Time: 1330 End Time: 1430   Type of Therapy/Topic:  Group Therapy:  Balance in Life  Participation Level:  Active   Description of Group:    This group will address the concept of balance and how it feels and looks when one is unbalanced. Patients will be encouraged to process areas in their lives that are out of balance, and identify reasons for remaining unbalanced. Facilitators will guide patients utilizing problem- solving interventions to address and correct the stressor making their life unbalanced. Understanding and applying boundaries will be explored and addressed for obtaining  and maintaining a balanced life. Patients will be encouraged to explore ways to assertively make their unbalanced needs known to significant others in their lives, using other group members and facilitator for support and feedback.  Therapeutic Goals: Patient will identify two or more emotions or situations they have that consume much of in their lives. Patient will identify signs/triggers that life has become out of balance:  Patient will identify two ways to set boundaries in order to achieve balance in their lives:  Patient will demonstrate ability to communicate their needs through discussion and/or role plays  Summary of Patient Progress: Patient was present in group.  Patient was an active participant.  Patient was supportive of others.  Patient was engaged and displayed fair insight.    Therapeutic Modalities:   Cognitive Behavioral Therapy Solution-Focused Therapy Assertiveness Training   [name], LCSW

## 2022-07-13 NOTE — Progress Notes (Signed)
Recreation Therapy   07/13/2022         Time: 1300      Group Topic/Focus: Reminisce/Music Therapy    Participation Level: Active  Participation Quality: Appropriate and Attentive  Affect: Appropriate  Cognitive: Appropriate, Oriented, and Alert   Additional Comments: Patient actively engaged in group and suggested a song that she had listed to in her past. She identified the song " Go rest high on that mountain" by Earlie Lou. She shared that this song reminded her of her best friend and attending the funeral of that best friends husband about 10 years ago. Patient explained that her best friends husband passed from cancer and that he had fought the disease for many years.    Kazuo Durnil E Liane Tribbey LRT/CTRS  07/13/2022 2:13 PM

## 2022-07-13 NOTE — Progress Notes (Signed)
1:1   7am- patient in dayroom 8am- patient in dayroom  9am- patient in dayroom asleep 10am- patient in the dayroom asleep 11am- patient in dayroom  12pm- patient in the dayroom sleep; patient at 5% of her lunch 1pm- patient in the dayroom 2pm- patient in the dayroom in group 3pm- patient in the dayroom 4pm- patient in the dayrrom 5pm- patient in the dayroom 6pm- patient in the dayroom 7pm- patient in dayroom with visitor

## 2022-07-13 NOTE — Progress Notes (Signed)
[facility] MD Progress Note  07/13/2022 10:00 AM Cathy [name]  MRN:  [id number] Subjective: Cathy [name] is seen on rounds.  She is participating in physical therapy.  She is still depressed but is very pleasant and cooperative smiles at times.  Nurses report that she had some delusions last night.  They do come and go.  We continue to look for skilled nursing so she can receive physical therapy on a regular basis.  In talking to Dr. Weber Cooks about maybe ECT.  It might be worth a try.  Increased her Provigil yesterday.  She is still not eating very well.  I told the nurses to continue to encourage oral intake. Principal Problem: Major depressive disorder, recurrent episode, severe (HCC) Diagnosis: Principal Problem:   Major depressive disorder, recurrent episode, severe (HCC) Active Problems:   Pelvic fracture (HCC)   Inferior pubic ramus fracture, left, closed, initial encounter ([place])  Total Time spent with patient: 15 minutes  Past Psychiatric History: She has been on Lexapro and Trintellix,  prescribed by her PCP. Recent trial of Wellbutrin. No past psychiatric hospitalizations and no past suicide attempts.      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   Vision changes     Past Surgical History:  Procedure Laterality Date   CHOLECYSTECTOMY     IR IMAGING GUIDED PORT INSERTION  09/21/2020   LITHOTRIPSY  1984   LYMPH NODE BIOPSY     MASS BIOPSY Left 09/03/2020    Procedure: LEFT NECK JUGULAR NODE;  Surgeon: Rozetta Nunnery, MD;  Location: [place];  Service: ENT;  Laterality: Left;   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   Procedure: LEFT TOTAL KNEE ARTHROPLASTY;  Surgeon: Gaynelle Arabian, MD;  Location: [facility];  Service: Orthopedics;  Laterality: Left;   Family History:  Family History  Problem Relation Age of Onset   Hypertension Mother    Stroke Mother    Heart attack Mother        CABG, 5 Stents   Dementia Mother    Cancer Father        Kidney   Asthma Sister    Diabetes Brother        Borderline   Alcohol abuse Brother    Cancer Maternal Aunt        stomach ca   Cancer Maternal Aunt        ovarian ca   Breast cancer Cousin    Colon cancer Neg Hx    Family Psychiatric  History: Unremarkable Social History:  Social History   Substance and Sexual Activity  Alcohol Use No     Social History   Substance and Sexual Activity  Drug Use No    Social History   Socioeconomic History   Marital status: Widowed    Spouse name: Not on file   Number of children:  Not on file   Years of education: Not on file   Highest education level: Not on file  Occupational History   Not on file  Tobacco Use   Smoking status: Never   Smokeless tobacco: Never  Substance and Sexual Activity   Alcohol use: No   Drug use: No   Sexual activity: Not Currently    Birth control/protection: Post-menopausal  Other Topics Concern   Not on file  Social History Narrative   Lives alone.     Son is [name].     Social Determinants of Health   Financial Resource Strain: Not on file  Food Insecurity: No Food Insecurity (06/14/2022)   Hunger Vital Sign    Worried About Running Out of Food in the Last Year: Never true    Ran Out of Food in the Last Year: Never true  Transportation Needs: No Transportation Needs (06/14/2022)   PRAPARE - Hydrologist (Medical): No    Lack of Transportation  (Non-Medical): No  Physical Activity: Not on file  Stress: Not on file  Social Connections: Not on file   Additional Social History:                         Sleep: Fair  Appetite:  Poor  Current Medications: Current Facility-Administered Medications  Medication Dose Route Frequency Provider Last Rate Last Admin   (feeding supplement) PROSource Plus liquid 30 mL  30 mL Oral TID BM Parks Ranger, DO       alum & mag hydroxide-simeth (MAALOX/MYLANTA) 200-200-20 MG/5ML suspension 30 mL  30 mL Oral Q4H PRN Parks Ranger, DO       aspirin EC tablet 81 mg  81 mg Oral Daily Parks Ranger, DO   81 mg at 07/13/22 0859   atorvastatin (LIPITOR) tablet 20 mg  20 mg Oral Daily Parks Ranger, DO   20 mg at 07/13/22 3419   cholecalciferol (VITAMIN D3) 25 MCG (1000 UNIT) tablet 1,000 Units  1,000 Units Oral Daily Parks Ranger, DO   1,000 Units at 07/12/22 0900   cyanocobalamin (VITAMIN B12) tablet 1,000 mcg  1,000 mcg Oral Daily Parks Ranger, DO   1,000 mcg at 07/13/22 0859   feeding supplement (ENSURE ENLIVE / ENSURE PLUS) liquid 237 mL  237 mL Oral TID BM Parks Ranger, DO   237 mL at 07/12/22 3790   haloperidol (HALDOL) tablet 1 mg  1 mg Oral QPC supper Parks Ranger, DO   1 mg at 07/12/22 1703   ibuprofen (ADVIL) tablet 400 mg  400 mg Oral Q6H PRN [name], [name], MD   400 mg at 07/10/22 1307   lip balm (BLISTEX) ointment   Topical PRN Parks Ranger, DO       LORazepam (ATIVAN) tablet 0.5 mg  0.5 mg Oral Q6H PRN Parks Ranger, DO   0.5 mg at 07/08/22 1432   magnesium hydroxide (MILK OF MAGNESIA) suspension 30 mL  30 mL Oral Daily PRN Parks Ranger, DO   30 mL at 06/24/22 1006   mirtazapine (REMERON) tablet 7.5 mg  7.5 mg Oral QHS Parks Ranger, DO   7.5 mg at 07/12/22 2115   modafinil (PROVIGIL) tablet 200 mg  200 mg Oral Daily Parks Ranger, DO   200 mg at  07/13/22 0900   multivitamin with minerals tablet 1 tablet  1 tablet Oral Daily Louis Meckel,  Nunzio Cory, DO   1 tablet at 07/13/22 0859   OLANZapine (ZYPREXA) tablet 5 mg  5 mg Oral QHS Parks Ranger, DO   5 mg at 07/12/22 2115   oxyCODONE-acetaminophen (PERCOCET) 7.5-325 MG per tablet 1 tablet  1 tablet Oral Q6H PRN [name], [name], MD   1 tablet at 07/13/22 0859   pantoprazole (PROTONIX) EC tablet 40 mg  40 mg Oral Daily Parks Ranger, DO   40 mg at 07/13/22 0859   polyethylene glycol (MIRALAX / GLYCOLAX) packet 17 g  17 g Oral Daily Parks Ranger, DO   17 g at 07/13/22 0901   traZODone (DESYREL) tablet 25 mg  25 mg Oral QHS PRN Parks Ranger, DO       venlafaxine XR (EFFEXOR-XR) 24 hr capsule 300 mg  300 mg Oral Q breakfast Parks Ranger, DO   300 mg at 07/13/22 9518    Lab Results: No results found for this or any previous visit (from the past 48 hour(s)).  Blood Alcohol level:  Lab Results  Component Value Date   ETH <10 84/16/6063    Metabolic Disorder Labs: Lab Results  Component Value Date   HGBA1C 5.5 06/10/2022   MPG 111.15 06/10/2022   MPG 105 04/05/2021   No results found for: "PROLACTIN" Lab Results  Component Value Date   CHOL 246 (H) 06/10/2022   TRIG 94 06/10/2022   HDL 57 06/10/2022   CHOLHDL 4.3 06/10/2022   VLDL 19 06/10/2022   LDLCALC 170 (H) 06/10/2022   LDLCALC 67 04/05/2021    Physical Findings: AIMS:  , ,  ,  ,    CIWA:    COWS:     Musculoskeletal: Strength & Muscle Tone: within normal limits Gait & Station: unsteady Patient leans: N/A  Psychiatric Specialty Exam:  Presentation  General Appearance:  Appropriate for Environment; Casual  Eye Contact: Good  Speech: Clear and Coherent; Normal Rate  Speech Volume: Normal  Handedness: Right   Mood and Affect  Mood: Dysphoric  Affect: Congruent   Thought Process  Thought Processes: Coherent  Descriptions of  Associations:Intact  Orientation:Full (Time, Place and Person)  Thought Content:Logical  History of Schizophrenia/Schizoaffective disorder:No  Duration of Psychotic Symptoms:No data recorded Hallucinations:No data recorded Ideas of Reference:None  Suicidal Thoughts:No data recorded Homicidal Thoughts:No data recorded  Sensorium  Memory: Immediate Good; Recent Good; Remote Good  Judgment: Good  Insight: Good   Executive Functions  Concentration: Good  Attention Span: Good  Recall: Good  Fund of Knowledge: Good  Language: Good   Psychomotor Activity  Psychomotor Activity:No data recorded  Assets  Assets: Physical Health; Resilience; Social Support; Catering manager; Housing   Sleep  Sleep:No data recorded   Physical Exam: Physical Exam Vitals and nursing note reviewed.  Constitutional:      Appearance: Normal appearance. She is normal weight.  Neurological:     General: No focal deficit present.     Mental Status: She is alert and oriented to person, place, and time.  Psychiatric:        Attention and Perception: Attention and perception normal.        Mood and Affect: Mood is depressed. Affect is flat.        Speech: Speech normal.        Behavior: Behavior normal. Behavior is cooperative.        Thought Content: Thought content normal.        Cognition and Memory: Memory normal. Cognition  is impaired.        Judgment: Judgment is inappropriate.    Review of Systems  Constitutional: Negative.   HENT: Negative.    Eyes: Negative.   Respiratory: Negative.    Cardiovascular: Negative.   Gastrointestinal: Negative.   Genitourinary: Negative.   Musculoskeletal: Negative.   Skin: Negative.   Neurological: Negative.   Endo/Heme/Allergies: Negative.   Psychiatric/Behavioral: Negative.     Blood pressure 113/66, pulse 99, temperature 98.7 F (37.1 C), temperature source Oral, resp. rate 16, height '5\' 2"'$  (1.575 m), SpO2 98 %.  Body mass index is 20.01 kg/m.   Treatment Plan Summary: Daily contact with patient to assess and evaluate symptoms and progress in treatment, Medication management, and Plan continue current medications.  Encourage oral intake.  ECT consult.  [place], DO 07/13/2022, 10:00 AM

## 2022-07-13 NOTE — Progress Notes (Signed)
NUTRITION ASSESSMENT  Pt identified as at risk on the Malnutrition Screen Tool  INTERVENTION:  -MVI with minerals daily -Continue regular diet -Continue Ensure Enlive po TID, each supplement provides 350 kcal and 20 grams of protein -30 ml Prosource Plus TID, each supplement provides 100 kcals and 15 grams protein -Mighty Shake TID with meals, each supplement provides 220 kcals and 6 grams protein -Magic cup TID with meals, each supplement provides 290 kcal and 9 grams of protein   NUTRITION DIAGNOSIS: Unintentional weight loss related to sub-optimal intake as evidenced by pt report.   Goal: Pt to meet >/= 90% of their estimated nutrition needs.  Monitor:  PO intake  Assessment:  Pt was involuntarily admitted to inpatient psychiatry for worsening depression and suicidal ideation.   Per psychiatry notes, plan to evaluate for ECT.   Pt on a regular diet, but not eating much. Per RN, she ate 3 bites of egg yesterday. Pt also refusing Ensure supplements, however, agreeable to take them when encouraged by MD. Pt refused last two doses of Ensure.   No new current wt. Reviewed weight history; pt has experienced a 9.3% wt loss from 02/2022-05/2022, which is significant for time frame.   Medications reviewed and include vitamin D3, vitamin B-12, remeron, miralax, and effexor.   Labs reviewed: CBGS: 25.   77 y.o. female  Height: Ht Readings from Last 1 Encounters:  06/14/22 '5\' 2"'$  (1.575 m)    Weight: Wt Readings from Last 1 Encounters:  05/24/22 49.6 kg    Weight Hx: Wt Readings from Last 10 Encounters:  05/24/22 49.6 kg  04/08/22 50.3 kg  03/23/22 49.9 kg  02/17/22 54.7 kg  01/04/22 58.3 kg  11/05/21 59.4 kg  10/26/21 59.9 kg  09/27/21 59.9 kg  08/12/21 62.1 kg  07/12/21 62.1 kg    BMI:  Body mass index is 20.01 kg/m. BMI WDL.   Estimated Nutritional Needs: Kcal: 30-35 kcal/kg Protein: > 1.5 gram protein/kg Fluid: 1 ml/kcal  Diet Order:  Diet Order              Diet regular Room service appropriate? Yes; Fluid consistency: Thin  Diet effective now                  Pt is also offered choice of unit snacks mid-morning and mid-afternoon.  Pt is eating as desired.   Lab results and medications reviewed.   Loistine Chance, RD, LDN, [place] Registered Dietitian II Certified Diabetes Care and Education Specialist Please refer to [facility] for RD and/or RD on-call/weekend/after hours pager

## 2022-07-13 NOTE — Progress Notes (Signed)
Physical Therapy Treatment Patient Details Name: Cathy [name] MRN: [id number] DOB: [birth year] Today's Date: 07/13/2022   History of Present Illness Pt is a 77 y.o. female presenting "voluntarily to [facility] reporting worsening depressive symptoms and suicidal ideation" 06/10/22; transferred to [facility] 06/14/22.  Unwitnessed fall 06/18/22 hitting head. Pt now with fall 07/01/22 and diagnosed with left inferior superior pubic ramus fracture and is WBAT per ortho consult note on 1/20.   PMH includes htn, vision changes, L TKA, lymphoma, depression, anxiety.    PT Comments    Pt was pleasant and motivated to participate during the session and put forth good effort throughout. Pt demonstrated good control and stability with transfer training with cues for sequencing but continued to require physical assistance to prevent posterior LOB with gait and balance training.  Pt remains at an elevated risk for falls with limited assistance available at discharge. Pt will benefit from PT services in a SNF setting upon discharge to safely address deficits listed in patient problem list for decreased caregiver assistance and eventual return to PLOF.     Recommendations for follow up therapy are one component of a multi-disciplinary discharge planning process, led by the attending physician.  Recommendations may be updated based on patient status, additional functional criteria and insurance authorization.  Follow Up Recommendations  Skilled nursing-short term rehab (<3 hours/day) Can patient physically be transported by private vehicle: Yes   Assistance Recommended at Discharge Frequent or constant Supervision/Assistance  Patient can return home with the following A little help with walking and/or transfers;A little help with bathing/dressing/bathroom;Assistance with cooking/housework;Direct supervision/assist for medications management;Direct supervision/assist for financial management;Assist for transportation;Help  with stairs or ramp for entrance   Equipment Recommendations  Rolling walker (2 wheels)    Recommendations for Other Services       Precautions / Restrictions Precautions Precautions: Fall Restrictions Weight Bearing Restrictions: Yes LLE Weight Bearing: Weight bearing as tolerated     Mobility  Bed Mobility               General bed mobility comments: NT, pt in recliner    Transfers Overall transfer level: Needs assistance Equipment used: Rolling walker (2 wheels) Transfers: Sit to/from Stand Sit to Stand: Supervision           General transfer comment: Min verbal cues for hand placement with fair eccentric and concentric control    Ambulation/Gait Ambulation/Gait assistance: Min assist Gait Distance (Feet): 100 Feet Assistive device: Rolling walker (2 wheels) Gait Pattern/deviations: Step-through pattern, Antalgic, Decreased stance time - left Gait velocity: decreased     General Gait Details: Mildly antalgic gait on the LLE with occasional min A to prevent posterior LOB   Stairs             Wheelchair Mobility    Modified Rankin (Stroke Patients Only)       Balance Overall balance assessment: Needs assistance, History of Falls Sitting-balance support: No upper extremity supported, Feet supported Sitting balance-Leahy Scale: Normal     Standing balance support: During functional activity, No upper extremity supported, Bilateral upper extremity supported Standing balance-Leahy Scale: Poor Standing balance comment: Frequent posterior LOB during standing balance training                            Cognition Arousal/Alertness: Awake/alert Behavior During Therapy: WFL for tasks assessed/performed Overall Cognitive Status: Within Functional Limits for tasks assessed  Exercises Total Joint Exercises Long Arc Quad: Strengthening, Both, 10 reps (with manual  resistance) Knee Flexion: Strengthening, Both, 10 reps (with manual resistance) Other Exercises Other Exercises: Dynamic unsupported standing balance training with reaching outside BOS with feet apart, together, and semi-tandem Other Exercises: Static unsupported standing balance training with feet apart, together, and semi-tandem with combinations of eyes open/closed and head still/head turns    General Comments        Pertinent Vitals/Pain Pain Assessment Pain Assessment: 0-10 Pain Score: 7  Pain Location: L hip Pain Descriptors / Indicators: Sore Pain Intervention(s): Repositioned, Premedicated before session, Monitored during session    Home Living                          Prior Function            PT Goals (current goals can now be found in the care plan section) Progress towards PT goals: Progressing toward goals    Frequency    Min 2X/week      PT Plan Current plan remains appropriate    Co-evaluation              AM-PAC PT "6 Clicks" Mobility   Outcome Measure  Help needed turning from your back to your side while in a flat bed without using bedrails?: A Little Help needed moving from lying on your back to sitting on the side of a flat bed without using bedrails?: A Little Help needed moving to and from a bed to a chair (including a wheelchair)?: A Little Help needed standing up from a chair using your arms (e.g., wheelchair or bedside chair)?: A Little Help needed to walk in hospital room?: A Little Help needed climbing 3-5 steps with a railing? : A Little 6 Click Score: 18    End of Session Equipment Utilized During Treatment: Gait belt Activity Tolerance: Patient tolerated treatment well Patient left: in chair;with nursing/sitter in room Nurse Communication: Mobility status PT Visit Diagnosis: Unsteadiness on feet (R26.81);History of falling (Z91.81);Other abnormalities of gait and mobility (R26.89);Muscle weakness (generalized)  (M62.81);Pain Pain - Right/Left: Left Pain - part of body: Hip     Time: 5320-2334 PT Time Calculation (min) (ACUTE ONLY): 24 min  Charges:  $Gait Training: 8-22 mins $Therapeutic Exercise: 8-22 mins                     D. Scott Rameen Gohlke PT, DPT 07/13/22, 12:02 PM

## 2022-07-13 NOTE — Plan of Care (Signed)
D- Patient alert and oriented x 4. Patient presents with a  pleasant mood and affect. Patient smiling and laughing in the dayroom with this Probation officer. Patient  denies SI, HI, AVH, but endorses pain. PRN pain medication administered.  Patient asking about being transferred off the unit. This Probation officer encouraged patient that she would not be transferred today.  A- Scheduled medications administered to patient, per MD orders. Support and encouragement provided.  Routine safety checks conducted every 15 minutes.  Patient informed to notify staff with problems or concerns.   R- No adverse drug reactions noted. Patient contracts for safety at this time. Patient compliant with medications and treatment plan. Patient receptive, calm, and cooperative. Patient interacts well with others on the unit.  Patient participated in group. Patient remains safe at this time.   Problem: Education: Goal: Knowledge of General Education information will improve Description: Including pain rating scale, medication(s)/side effects and non-pharmacologic comfort measures Outcome: Progressing   Problem: Health Behavior/Discharge Planning: Goal: Ability to manage health-related needs will improve Outcome: Not Progressing   Problem: Clinical Measurements: Goal: Ability to maintain clinical measurements within normal limits will improve Outcome: Progressing Goal: Will remain free from infection Outcome: Progressing Goal: Diagnostic test results will improve Outcome: Progressing Goal: Respiratory complications will improve Outcome: Progressing Goal: Cardiovascular complication will be avoided Outcome: Progressing   Problem: Activity: Goal: Risk for activity intolerance will decrease Outcome: Progressing   Problem: Nutrition: Goal: Adequate nutrition will be maintained Outcome: Not Progressing   Problem: Elimination: Goal: Will not experience complications related to urinary retention Outcome: Progressing

## 2022-07-13 NOTE — [facility] Group Notes (Signed)
Patient attended group and engaged in group discussion

## 2022-07-13 NOTE — Progress Notes (Signed)
Patient not eating or drinking much. Encouragement and prompting provided to the patient to and drink. Patient still not eating of drinking much.Patient states that she most of her breakfast. This writer noted that a spoon amount of cream of wheat was eaten. MD made aware. Ensure and Protein Prosourse supplement provided to patient at 11:02 am. Patient did not drink either.  Patient has not drink her cup of water from 10 am either.   Lunch provided to patient. This Probation officer notes patient moving food around on her plate, but is not eating. This Probation officer encouraged patient to eat her lunch and drink fluids. This Probation officer will continue to monitor patient for intake and provide the necessary reminders and encouragement for food and fluid intake.

## 2022-07-14 DIAGNOSIS — F332 Major depressive disorder, recurrent severe without psychotic features: Secondary | ICD-10-CM | POA: Diagnosis not present

## 2022-07-14 NOTE — Plan of Care (Signed)
D- Patient alert and oriented x 3. Patient disoriented to full situation. Patient  Denies SI, HI, AVH, anxiety, depression, but endorses pain. PRN pain medication given. This Probation officer notes patient is a little anxious about being transferred to another facility. Patient states her goal is to eat more so that she can go home.   A- Scheduled medications administered to patient, per MD orders. Support and encouragement provided.  Routine safety checks conducted every 15 minutes.  Patient informed to notify staff with problems or concerns.  R- No adverse drug reactions noted. Patient contracts for safety at this time. Patient compliant with medications and treatment plan. Patient receptive, calm, and cooperative. Patient interacts well with others on the unit. Patient participated in group. Patient remains safe at this time.  Problem: Education: Goal: Knowledge of General Education information will improve Description: Including pain rating scale, medication(s)/side effects and non-pharmacologic comfort measures Outcome: Not Progressing   Problem: Health Behavior/Discharge Planning: Goal: Ability to manage health-related needs will improve Outcome: Not Progressing   Problem: Clinical Measurements: Goal: Ability to maintain clinical measurements within normal limits will improve Outcome: Not Progressing Goal: Will remain free from infection Outcome: Progressing Goal: Diagnostic test results will improve Outcome: Progressing Goal: Respiratory complications will improve Outcome: Progressing Goal: Cardiovascular complication will be avoided Outcome: Progressing   Problem: Activity: Goal: Risk for activity intolerance will decrease Outcome: Progressing   Problem: Nutrition: Goal: Adequate nutrition will be maintained Outcome: Progressing   Problem: Coping: Goal: Level of anxiety will decrease Outcome: Progressing   Problem: Elimination: Goal: Will not experience complications related to  bowel motility Outcome: Progressing Goal: Will not experience complications related to urinary retention Outcome: Progressing

## 2022-07-14 NOTE — Progress Notes (Signed)
1:1 Hourly Rounding  7am- patient in the dayroom 8am-patient in the dayroom 9am-patient in the dayroom, patient taken to bathroom by this writer 10am-patient in dayroom  11am- patient in her room taking a shower 11:30am- patient in the dayroom for lunch. Patient ate 50% 12pm-patient in the dayroom 1pm-patient in the dayroom 2pm-patient in the dayroom 3pm-patient in the dayroom 4pm-patient in the dayroom 5pm-patient in the dayroom 6pm-patient in the dayroom 7pm-patient in the dayroom with visitor

## 2022-07-14 NOTE — Progress Notes (Signed)
Chair alarm placed under patient for safety.

## 2022-07-14 NOTE — Progress Notes (Signed)
1:1 observation  2000: Patient monitored on 1:1 Patient very anxious requesting if she was going to be send to another place. Patient reoriented to Place and situation. Denies SI/HI/A/VH and verbally contracted for safety.   2100: Patient tearful about not wanting to leave stated she has been thinking about being told to leave and not sure where she will go. Support and encouragement provided.   2200 Patient continued on 1:1 compliant with medication assisted to the Bathroom ambulating with front wheel walker and all fall protocol in place. Patient walking with unsteady gait. Voided x 1 in the bathroom.  2300: Patient continued to monitor 1:1 in bed tosing and turning required pillows for her hip.   0000: Patient in bed sleeping respirations noted. 1:1 sitter in room  0001:  Patient in bed asleep respirations noted 1:1 sitter in room.  0200 Patient awake assisted to ambulate to the bathroom by 1:1 Staff. Patient void and remains safe  0300 Patient in bed sleeping respirations noted 1:1 sitter by bedside.  0400 Patient in bed sleeping respirations noted no S/S of distress Sitter in room with Patient.  0500 Patient awake assisted to the bathroom using walker and gait belt by 1:1 Sitter.  0600 Patient in bed sleeping respirations noted.   0700 Patient in bed sleeping respirations noted. Report given to oncoming Staff. Patient remains safe.

## 2022-07-14 NOTE — [facility] Group Notes (Addendum)
07/14/2022         Time: 1400      Group Topic/Focus: Expressive Art- Painting a Peaceful Place   Participation Level: Active  Participation Quality: Appropriate  Affect: Appropriate  Cognitive: Appropriate, Oriented, and Alert   Additional Comments: Patient attended expressive art group. Patient identified "a big field with flowers" as her peaceful place and used the colors green, blue and pink.   Vilma Prader LRT/CTRS  07/14/2022 2:47 PM

## 2022-07-14 NOTE — Progress Notes (Signed)
[facility] MD Progress Note  07/14/2022 11:37 AM Cathy [name]  MRN:  [id number] Subjective: Cathy [name] is seen on rounds.  She is able to smile and speak appropriately.  She needs a lot of encouragement for oral intake.  She has been compliant with medications.  She seems a little less depressed today.  Physical therapy is still working with her and still recommends a SNF.  I think that would be wise.  She denies any side effects from her medications.  Principal Problem: Major depressive disorder, recurrent episode, severe (HCC) Diagnosis: Principal Problem:   Major depressive disorder, recurrent episode, severe (HCC) Active Problems:   Pelvic fracture (HCC)   Inferior pubic ramus fracture, left, closed, initial encounter ([place])  Total Time spent with patient: 15 min   Past Psychiatric History: She has been on Lexapro and Trintellix,  prescribed by her PCP. Recent trial of Wellbutrin. No past psychiatric hospitalizations and no past suicide attempts.       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   Vision changes     Past Surgical History:  Procedure Laterality Date   CHOLECYSTECTOMY     IR IMAGING GUIDED PORT INSERTION  09/21/2020   LITHOTRIPSY  1984   LYMPH NODE BIOPSY     MASS BIOPSY Left 09/03/2020   Procedure: LEFT NECK JUGULAR NODE;  Surgeon: Rozetta Nunnery, MD;  Location: [facility];  Service: ENT;  Laterality: Left;   TOTAL KNEE  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   Procedure: LEFT TOTAL KNEE ARTHROPLASTY;  Surgeon: Gaynelle Arabian, MD;  Location: [facility];  Service: Orthopedics;  Laterality: Left;   Family History:  Family History  Problem Relation Age of Onset   Hypertension Mother    Stroke Mother    Heart attack Mother        CABG, 5 Stents   Dementia Mother    Cancer Father        Kidney   Asthma Sister    Diabetes Brother        Borderline   Alcohol abuse Brother    Cancer Maternal Aunt        stomach ca   Cancer Maternal Aunt        ovarian ca   Breast cancer Cousin    Colon cancer Neg Hx    Family Psychiatric  History: Unremarkable Social History:  Social History   Substance and Sexual Activity  Alcohol Use No     Social History   Substance and Sexual Activity  Drug Use No    Social History   Socioeconomic History   Marital status: Widowed    Spouse name: Not on file   Number of children: Not on file   Years of education: Not on file   Highest education level: Not on file  Occupational History   Not on file  Tobacco  Use   Smoking status: Never   Smokeless tobacco: Never  Substance and Sexual Activity   Alcohol use: No   Drug use: No   Sexual activity: Not Currently    Birth control/protection: Post-menopausal  Other Topics Concern   Not on file  Social History Narrative   Lives alone.     Son is [name].     Social Determinants of Health   Financial Resource Strain: Not on file  Food Insecurity: No Food Insecurity (06/14/2022)   Hunger Vital Sign    Worried About Running Out of Food in the Last Year: Never true    Ran Out of Food in the Last Year: Never true  Transportation Needs: No Transportation Needs (06/14/2022)   PRAPARE - Hydrologist (Medical): No    Lack of Transportation (Non-Medical): No  Physical Activity: Not on file  Stress: Not on file  Social Connections: Not on file   Additional Social History:                          Sleep: Good  Appetite:  Fair  Current Medications: Current Facility-Administered Medications  Medication Dose Route Frequency Provider Last Rate Last Admin   (feeding supplement) PROSource Plus liquid 30 mL  30 mL Oral TID BM Parks Ranger, DO   30 mL at 07/14/22 0851   alum & mag hydroxide-simeth (MAALOX/MYLANTA) 200-200-20 MG/5ML suspension 30 mL  30 mL Oral Q4H PRN Parks Ranger, DO       aspirin EC tablet 81 mg  81 mg Oral Daily Parks Ranger, DO   81 mg at 07/14/22 0851   atorvastatin (LIPITOR) tablet 20 mg  20 mg Oral Daily Parks Ranger, DO   20 mg at 07/14/22 6269   cholecalciferol (VITAMIN D3) 25 MCG (1000 UNIT) tablet 1,000 Units  1,000 Units Oral Daily Parks Ranger, DO   1,000 Units at 07/14/22 4854   cyanocobalamin (VITAMIN B12) tablet 1,000 mcg  1,000 mcg Oral Daily Parks Ranger, DO   1,000 mcg at 07/14/22 6270   feeding supplement (ENSURE ENLIVE / ENSURE PLUS) liquid 237 mL  237 mL Oral TID BM Parks Ranger, DO   237 mL at 07/14/22 0850   haloperidol (HALDOL) tablet 1 mg  1 mg Oral QPC supper Parks Ranger, DO   1 mg at 07/13/22 1742   ibuprofen (ADVIL) tablet 400 mg  400 mg Oral Q6H PRN [name], [name], MD   400 mg at 07/13/22 1637   lip balm (BLISTEX) ointment   Topical PRN Parks Ranger, DO       LORazepam (ATIVAN) tablet 0.5 mg  0.5 mg Oral Q6H PRN Parks Ranger, DO   0.5 mg at 07/08/22 1432   magnesium hydroxide (MILK OF MAGNESIA) suspension 30 mL  30 mL Oral Daily PRN Parks Ranger, DO   30 mL at 06/24/22 1006   mirtazapine (REMERON) tablet 7.5 mg  7.5 mg Oral QHS Parks Ranger, DO   7.5 mg at 07/13/22 2058   modafinil (PROVIGIL) tablet 200 mg  200 mg Oral Daily Parks Ranger, DO   200 mg at 07/14/22 3500   multivitamin with minerals tablet 1 tablet  1 tablet Oral Daily Parks Ranger, DO   1 tablet at 07/14/22 0851    OLANZapine (ZYPREXA) tablet 5 mg  5 mg Oral QHS Parks Ranger, DO  5 mg at 07/13/22 2058   oxyCODONE-acetaminophen (PERCOCET) 7.5-325 MG per tablet 1 tablet  1 tablet Oral Q6H PRN [name], [name], MD   1 tablet at 07/14/22 0851   pantoprazole (PROTONIX) EC tablet 40 mg  40 mg Oral Daily Parks Ranger, DO   40 mg at 07/14/22 1028   polyethylene glycol (MIRALAX / GLYCOLAX) packet 17 g  17 g Oral Daily Parks Ranger, DO   17 g at 07/14/22 1028   traZODone (DESYREL) tablet 25 mg  25 mg Oral QHS PRN Parks Ranger, DO       venlafaxine XR (EFFEXOR-XR) 24 hr capsule 300 mg  300 mg Oral Q breakfast Parks Ranger, DO   300 mg at 07/14/22 9147    Lab Results: No results found for this or any previous visit (from the past 48 hour(s)).  Blood Alcohol level:  Lab Results  Component Value Date   ETH <10 82/95/6213    Metabolic Disorder Labs: Lab Results  Component Value Date   HGBA1C 5.5 06/10/2022   MPG 111.15 06/10/2022   MPG 105 04/05/2021   No results found for: "PROLACTIN" Lab Results  Component Value Date   CHOL 246 (H) 06/10/2022   TRIG 94 06/10/2022   HDL 57 06/10/2022   CHOLHDL 4.3 06/10/2022   VLDL 19 06/10/2022   LDLCALC 170 (H) 06/10/2022   LDLCALC 67 04/05/2021    Physical Findings: AIMS:  , ,  ,  ,    CIWA:    COWS:     Musculoskeletal: Strength & Muscle Tone: within normal limits Gait & Station: unsteady Patient leans: N/A  Psychiatric Specialty Exam:  Presentation  General Appearance:  Appropriate for Environment; Casual  Eye Contact: Good  Speech: Clear and Coherent; Normal Rate  Speech Volume: Normal  Handedness: Right   Mood and Affect  Mood: Dysphoric  Affect: Congruent   Thought Process  Thought Processes: Coherent  Descriptions of Associations:Intact  Orientation:Full (Time, Place and Person)  Thought Content:Logical  History of Schizophrenia/Schizoaffective  disorder:No  Duration of Psychotic Symptoms:No data recorded Hallucinations:No data recorded Ideas of Reference:None  Suicidal Thoughts:No data recorded Homicidal Thoughts:No data recorded  Sensorium  Memory: Immediate Good; Recent Good; Remote Good  Judgment: Good  Insight: Good   Executive Functions  Concentration: Good  Attention Span: Good  Recall: Good  Fund of Knowledge: Good  Language: Good   Psychomotor Activity  Psychomotor Activity:No data recorded  Assets  Assets: Physical Health; Resilience; Social Support; Catering manager; Housing   Sleep  Sleep:No data recorded   Physical Exam: Physical Exam Vitals and nursing note reviewed.  Constitutional:      Appearance: Normal appearance. She is normal weight.  Neurological:     General: No focal deficit present.     Mental Status: She is alert and oriented to person, place, and time.  Psychiatric:        Attention and Perception: Attention and perception normal.        Mood and Affect: Mood is depressed. Affect is flat.        Speech: Speech normal.        Behavior: Behavior normal. Behavior is cooperative.        Thought Content: Thought content normal.        Cognition and Memory: Memory normal. Cognition is impaired.        Judgment: Judgment normal.    Review of Systems  Constitutional: Negative.   HENT: Negative.  Eyes: Negative.   Respiratory: Negative.    Cardiovascular: Negative.   Gastrointestinal: Negative.   Genitourinary: Negative.   Musculoskeletal: Negative.   Skin: Negative.   Neurological: Negative.   Endo/Heme/Allergies: Negative.   Psychiatric/Behavioral:  Positive for depression.    Blood pressure (!) 110/57, pulse 92, temperature 97.8 F (36.6 C), resp. rate 18, height '5\' 2"'$  (1.575 m), SpO2 97 %. Body mass index is 20.01 kg/m.   Treatment Plan Summary: Daily contact with patient to assess and evaluate symptoms and progress in treatment,  Medication management, and Plan continue current medications.  Parks Ranger, DO 07/14/2022, 11:37 AM

## 2022-07-15 DIAGNOSIS — F332 Major depressive disorder, recurrent severe without psychotic features: Secondary | ICD-10-CM | POA: Diagnosis not present

## 2022-07-15 NOTE — BHH Counselor (Deleted)
CSW attempted to contact the patient's potential group home.  CSW left HIPAA compliant voicemail requesting return call.   Assunta Curtis, MSW, LCSW 07/15/2022 9:29 AM

## 2022-07-15 NOTE — Progress Notes (Signed)
1:1 Hourly Rounding  1900 Pt in dayroom visiting family 2000 Pt in dayroom with sitter 2100 Pt in bedroom with sitter 2115 Pt had an assisted fall while ambulating with the walker past the bed.  Pt denies injury with the exception of a superficial abrasion on right inner forearm which required no treatment. 2200 Pt in bed, Sitter at bedside 2300 Pt in bed, sitter at bedside 0000 Pt in bed, sitter at bedside 0100 Pt  in bed, sitter at bedside 0200 Pt in bed, sitter at bedside 0300 Pt  in bed, sitter at bedside 0400 Pt  in bed, sitter at bedside 0500 Pt  in bed, sitter at bedside 0600 Pt in bed, sitter at bedside.

## 2022-07-15 NOTE — Plan of Care (Signed)
Pt had a visit from her son today during visitation time.  Pt stated that she is very proud of her son and his accomplishments.  Pt denied complaints or pain at this time. Pt denies SI Hi AVH.  Around 9:10 PM Pt was accompanied by sitter and ambulating in her room with her walker.  From what the patient can recall the walker did not move forward as it had come in contact with the bed and pt lost her balance.  The patient's 1:1 aide recognized the situation and was able to assist the fall by helping support the patient with the front of her legs/knees.    The patient denied injury with the exception of a superficial 2" lengthwise abrasion on her right proximal forearm just below the bend of the elbow.  The patient denied any new onset pain, numbness or tingling.  The patient was able to stand up with assistant and continue ambulating with assistance to the bed.    Vital signs were obtained and the patient was resting in bed within 15 minutes after the fall.  The provider on call was notified and no new orders were given.  We continued to monitor via 1:1 observation.

## 2022-07-15 NOTE — Group Note (Signed)
LCSW Group Therapy Note  Group Date: 07/15/2022 Start Time: 1330 End Time: 1430   Type of Therapy and Topic:  Group Therapy - Healthy vs Unhealthy Coping Skills  Participation Level:  Active   Description of Group The focus of this group was to determine what unhealthy coping techniques typically are used by group members and what healthy coping techniques would be helpful in coping with various problems. Patients were guided in becoming aware of the differences between healthy and unhealthy coping techniques. Patients were asked to identify 2-3 healthy coping skills they would like to learn to use more effectively.  Therapeutic Goals Patients learned that coping is what human beings do all day long to deal with various situations in their lives Patients defined and discussed healthy vs unhealthy coping techniques Patients identified their preferred coping techniques and identified whether these were healthy or unhealthy Patients determined 2-3 healthy coping skills they would like to become more familiar with and use more often. Patients provided support and ideas to each other   Summary of Patient Progress: Patient proved open to input from peers and feedback from [place]. Patient demonstrated fair insight into the subject matter, was respectful of peers, and participated throughout the entire session.  Patient was somewhat preoccupied with remaining the hospital, however, was redirectable.    Therapeutic Modalities Cognitive Behavioral Therapy Motivational Interviewing  [name], [place] 07/15/2022  3:01 PM

## 2022-07-15 NOTE — BHH Counselor (Signed)
CSW called the following to follow up on referral status:  Destination  Service Provider Request Status Address Phone Fax Responses  [facility] Preferred SNF  Considering Still reviewing [address] [place] [address] [phone] Patient remains under review.  Requested that H&P be sent as well.  CSW has sent that document.  [facility] Preferred SNF  Pending - Request Sent [address], [place] [address] [phone] Line rang with no pickup or option to leave a message  [facility] [address] [place] [address] [phone] Number listed is for [facility] not SNF.  Transferred to extension 100.  HIPAA compliant voicemail left.  [facility] SN[facility]ALF  Pending - Request Sent [address], [place] [address] [phone] Same as below.  [facility]  Pending - Request Sent [phone], [place] [address] [phone] CSW left HIPAA compliant with Margarita Grizzle.  Voicemail indicates that current beds are being utilized [facility].  [facility] SN[facility]ALF  Pending - Request Sent [address] [place] [address] [phone] Transferred to several different people as the number listed in EPIC is incorrect.  Transferred to [place], who is not in on Fridays.  Left HIPAA compliant voicemail.  Voicemail indicates that can reach [place] at [phone]  or Nurse Manager at [phone].  CSW also left message with Tim Lair.  [facility] Preferred SNF  Pending - Request Sent [address] [place] [address] [phone] Number not in service.  [facility]  Pending - Request Sent [address], [place] [address] [phone]  [phone] CSW left HIPAA compliant voicemail.   [facility]  Pending - Request Sent [address] [place] [address] [phone] Line rang with no pickup or option to leave a message  [facility] Preferred [facility]  Pending - Request Sent [address] [employer], [place] [address] [phone] HIPAA compliant voicemail left.  [facility]  Pending - Request Sent [place], [place] [address] [phone] [facility] not a SNF

## 2022-07-15 NOTE — Plan of Care (Signed)
Pt denies anxiety/depression at this time. Pt denies SI/HI/AVH or pain at this time. Pt is calm and cooperative. Pt is medication compliant. Pt provided with support and encouragement. Pt monitored q15 minutes for safety per unit policy. Plan of care ongoing.   Pt voices concern about no one wanting her (placement wise). Pt believes she will be here for a long time. Pt also believes the MD is making her take the protein supplement out of spite and that he must not like her because it taste awful. This Probation officer explained why she need the ensures and the Prosource. This Probation officer also explained the importance of her nutritional intake. Patient reports she will try to eat better if it means she will not have to take that awful tasting stuff.   Problem: Education: Goal: Knowledge of General Education information will improve Description: Including pain rating scale, medication(s)/side effects and non-pharmacologic comfort measures Outcome: Progressing   Problem: Nutrition: Goal: Adequate nutrition will be maintained Outcome: Not Progressing

## 2022-07-15 NOTE — Progress Notes (Signed)
1:1 Hourly Rounding  0730: Pt in dayroom calm and composed with sitter at side.  0830: Pt in dayroom calm and composed with sitter at side.  0930: Pt in dayroom calm and composed with sitter at side.  1030: Pt in dayroom calm and composed with sitter at side.  1130: Pt in dayroom calm and composed with sitter at side.  1230: Pt in dayroom calm and composed with sitter at side.  1330: Pt in dayroom calm and composed with sitter at side.  1430: Pt in dayroom calm and composed with sitter at side.  1530: Pt in dayroom calm and composed with sitter at side.  1630: Pt in dayroom calm and composed with sitter at side.  1730: Pt in dayroom calm and composed with sitter at side.  1830: Pt in dayroom calm and composed with sitter at side.  1900: Pt in dayroom calm and composed visiting with son.

## 2022-07-15 NOTE — Progress Notes (Signed)
[facility] MD Progress Note  07/15/2022 11:47 AM Cathy [name]  MRN:  [id number] Subjective: Cathy [name] is seen on rounds.  She seems to be doing a little bit better.  Provigil seems to be helping with her mood.  She is more animated and seems to be happier.  She had an incident last night where she tripped but there was no injury.  She is being compliant with medications.  Biggest problem with her right now is her appetite.  She has been drinking Ensure with my encouragement. Principal Problem: Major depressive disorder, recurrent episode, severe (HCC) Diagnosis: Principal Problem:   Major depressive disorder, recurrent episode, severe (HCC) Active Problems:   Pelvic fracture (HCC)   Inferior pubic ramus fracture, left, closed, initial encounter ([place])  Total Time spent with patient: 15 minutes  Past Psychiatric History:  She has been on Lexapro and Trintellix,  prescribed by her PCP. Recent trial of Wellbutrin. No past psychiatric hospitalizations and no past suicide attempts.        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   Vision changes     Past Surgical History:  Procedure Laterality Date   CHOLECYSTECTOMY     IR IMAGING GUIDED PORT INSERTION  09/21/2020   LITHOTRIPSY  1984   LYMPH NODE BIOPSY     MASS BIOPSY Left 09/03/2020   Procedure: LEFT NECK JUGULAR NODE;  Surgeon: Rozetta Nunnery, MD;  Location: [place];  Service: ENT;  Laterality:  Left;   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   Procedure: LEFT TOTAL KNEE ARTHROPLASTY;  Surgeon: Gaynelle Arabian, MD;  Location: [facility];  Service: Orthopedics;  Laterality: Left;   Family History:  Family History  Problem Relation Age of Onset   Hypertension Mother    Stroke Mother    Heart attack Mother        CABG, 5 Stents   Dementia Mother    Cancer Father        Kidney   Asthma Sister    Diabetes Brother        Borderline   Alcohol abuse Brother    Cancer Maternal Aunt        stomach ca   Cancer Maternal Aunt        ovarian ca   Breast cancer Cousin    Colon cancer Neg Hx    Family Psychiatric  History: Unremarkable Social History:  Social History   Substance and Sexual Activity  Alcohol Use No     Social History   Substance and Sexual Activity  Drug Use No    Social History   Socioeconomic History   Marital status: Widowed    Spouse name: Not on file   Number of children: Not on file   Years of education: Not on file   Highest education level: Not on file  Occupational History  Not on file  Tobacco Use   Smoking status: Never   Smokeless tobacco: Never  Substance and Sexual Activity   Alcohol use: No   Drug use: No   Sexual activity: Not Currently    Birth control/protection: Post-menopausal  Other Topics Concern   Not on file  Social History Narrative   Lives alone.     Son is [name].     Social Determinants of Health   Financial Resource Strain: Not on file  Food Insecurity: No Food Insecurity (06/14/2022)   Hunger Vital Sign    Worried About Running Out of Food in the Last Year: Never true    Ran Out of Food in the Last Year: Never true  Transportation Needs: No Transportation Needs (06/14/2022)   PRAPARE - Hydrologist (Medical): No    Lack of Transportation (Non-Medical): No  Physical Activity: Not on file  Stress: Not on file  Social Connections: Not on file   Additional Social History:                          Sleep: Good  Appetite:  Good  Current Medications: Current Facility-Administered Medications  Medication Dose Route Frequency Provider Last Rate Last Admin   (feeding supplement) PROSource Plus liquid 30 mL  30 mL Oral TID BM Parks Ranger, DO   30 mL at 07/15/22 0932   alum & mag hydroxide-simeth (MAALOX/MYLANTA) 200-200-20 MG/5ML suspension 30 mL  30 mL Oral Q4H PRN Parks Ranger, DO       aspirin EC tablet 81 mg  81 mg Oral Daily Parks Ranger, DO   81 mg at 07/15/22 4008   atorvastatin (LIPITOR) tablet 20 mg  20 mg Oral Daily Parks Ranger, DO   20 mg at 07/15/22 6761   cholecalciferol (VITAMIN D3) 25 MCG (1000 UNIT) tablet 1,000 Units  1,000 Units Oral Daily Parks Ranger, DO   1,000 Units at 07/15/22 9509   cyanocobalamin (VITAMIN B12) tablet 1,000 mcg  1,000 mcg Oral Daily Parks Ranger, DO   1,000 mcg at 07/15/22 3267   feeding supplement (ENSURE ENLIVE / ENSURE PLUS) liquid 237 mL  237 mL Oral TID BM Parks Ranger, DO   237 mL at 07/15/22 0933   haloperidol (HALDOL) tablet 1 mg  1 mg Oral QPC supper Parks Ranger, DO   1 mg at 07/14/22 1805   ibuprofen (ADVIL) tablet 400 mg  400 mg Oral Q6H PRN [name], [name], MD   400 mg at 07/14/22 1805   lip balm (BLISTEX) ointment   Topical PRN Parks Ranger, DO       LORazepam (ATIVAN) tablet 0.5 mg  0.5 mg Oral Q6H PRN Parks Ranger, DO   0.5 mg at 07/08/22 1432   magnesium hydroxide (MILK OF MAGNESIA) suspension 30 mL  30 mL Oral Daily PRN Parks Ranger, DO   30 mL at 06/24/22 1006   mirtazapine (REMERON) tablet 7.5 mg  7.5 mg Oral QHS Parks Ranger, DO   7.5 mg at 07/14/22 2102   modafinil (PROVIGIL) tablet 200 mg  200 mg Oral Daily Parks Ranger, DO   200 mg at 07/15/22 1245   multivitamin with minerals tablet 1 tablet  1 tablet Oral Daily Parks Ranger, DO   1 tablet at  07/15/22 0903   OLANZapine (ZYPREXA) tablet 5 mg  5 mg Oral  QHS Parks Ranger, DO   5 mg at 07/14/22 2101   oxyCODONE-acetaminophen (PERCOCET) 7.5-325 MG per tablet 1 tablet  1 tablet Oral Q6H PRN [name], [name], MD   1 tablet at 07/14/22 0851   pantoprazole (PROTONIX) EC tablet 40 mg  40 mg Oral Daily Parks Ranger, DO   40 mg at 07/15/22 3716   polyethylene glycol (MIRALAX / GLYCOLAX) packet 17 g  17 g Oral Daily Parks Ranger, DO   17 g at 07/15/22 9678   traZODone (DESYREL) tablet 25 mg  25 mg Oral QHS PRN Parks Ranger, DO       venlafaxine XR (EFFEXOR-XR) 24 hr capsule 300 mg  300 mg Oral Q breakfast Parks Ranger, DO   300 mg at 07/15/22 9381    Lab Results: No results found for this or any previous visit (from the past 48 hour(s)).  Blood Alcohol level:  Lab Results  Component Value Date   ETH <10 01/75/1025    Metabolic Disorder Labs: Lab Results  Component Value Date   HGBA1C 5.5 06/10/2022   MPG 111.15 06/10/2022   MPG 105 04/05/2021   No results found for: "PROLACTIN" Lab Results  Component Value Date   CHOL 246 (H) 06/10/2022   TRIG 94 06/10/2022   HDL 57 06/10/2022   CHOLHDL 4.3 06/10/2022   VLDL 19 06/10/2022   LDLCALC 170 (H) 06/10/2022   LDLCALC 67 04/05/2021    Physical Findings: AIMS:  , ,  ,  ,    CIWA:    COWS:     Musculoskeletal: Strength & Muscle Tone: within normal limits Gait & Station: unsteady Patient leans: N/A  Psychiatric Specialty Exam:  Presentation  General Appearance:  Appropriate for Environment; Casual  Eye Contact: Good  Speech: Clear and Coherent; Normal Rate  Speech Volume: Normal  Handedness: Right   Mood and Affect  Mood: Dysphoric  Affect: Congruent   Thought Process  Thought Processes: Coherent  Descriptions of Associations:Intact  Orientation:Full (Time, Place and Person)  Thought Content:Logical  History of Schizophrenia/Schizoaffective  disorder:No  Duration of Psychotic Symptoms:No data recorded Hallucinations:No data recorded Ideas of Reference:None  Suicidal Thoughts:No data recorded Homicidal Thoughts:No data recorded  Sensorium  Memory: Immediate Good; Recent Good; Remote Good  Judgment: Good  Insight: Good   Executive Functions  Concentration: Good  Attention Span: Good  Recall: Good  Fund of Knowledge: Good  Language: Good   Psychomotor Activity  Psychomotor Activity:No data recorded  Assets  Assets: Physical Health; Resilience; Social Support; Catering manager; Housing   Sleep  Sleep:No data recorded   Physical Exam: Physical Exam Vitals and nursing note reviewed.  Constitutional:      Appearance: Normal appearance. She is normal weight.  Neurological:     General: No focal deficit present.     Mental Status: She is alert and oriented to person, place, and time.  Psychiatric:        Attention and Perception: Attention and perception normal.        Mood and Affect: Mood is depressed. Affect is flat.        Speech: Speech normal.        Behavior: Behavior normal. Behavior is cooperative.        Thought Content: Thought content normal.        Cognition and Memory: Cognition and memory normal.        Judgment: Judgment normal.    Review of Systems  Constitutional: Negative.  HENT: Negative.    Eyes: Negative.   Respiratory: Negative.    Cardiovascular: Negative.   Gastrointestinal: Negative.   Genitourinary: Negative.   Musculoskeletal: Negative.   Skin: Negative.   Neurological: Negative.   Endo/Heme/Allergies: Negative.   Psychiatric/Behavioral:  Positive for depression.    Blood pressure (!) 120/58, pulse 89, temperature 98.6 F (37 C), temperature source Oral, resp. rate 16, height '5\' 2"'$  (1.575 m), SpO2 97 %. Body mass index is 20.01 kg/m.   Treatment Plan Summary: Daily contact with patient to assess and evaluate symptoms and progress in  treatment, Medication management, and Plan continue current medications.  Parks Ranger, DO 07/15/2022, 11:47 AM

## 2022-07-15 NOTE — Progress Notes (Signed)
Around 9:10 PM Pt was accompanied by sitter and ambulating in her room with her walker.  From what the patient can recall the walker did not move forward as it had come in contact with the bed and pt lost her balance.  The patient's 1:1 aide recognized the situation and was able to assist the fall by helping support the patient with the front of her legs/knees.     The patient denied injury with the exception of a superficial 2" lengthwise abrasion on her right proximal forearm just below the bend of the elbow.  The patient denied any new onset pain, numbness or tingling.  The patient was able to stand up with assistant and continue ambulating with assistance to the bed.     Vital signs were obtained and the patient was resting in bed within 15 minutes after the fall.  The provider on call was notified and no new orders were given.  We continued to monitor via 1:1 observation.

## 2022-07-16 DIAGNOSIS — F332 Major depressive disorder, recurrent severe without psychotic features: Secondary | ICD-10-CM | POA: Diagnosis not present

## 2022-07-16 NOTE — Plan of Care (Signed)
  Problem: Coping: Goal: Level of anxiety will decrease Outcome: Progressing   Problem: Coping: Goal: Coping ability will improve Outcome: Progressing Goal: Will verbalize feelings Outcome: Progressing   Problem: Skin Integrity: Goal: Risk for impaired skin integrity will decrease Outcome: Not Progressing

## 2022-07-16 NOTE — Progress Notes (Signed)
The patient was cooperative with treatment on shift, she remains on 1:1 observation for falls and safety. She had a visit with her son on the shift, she was bright on approach and she was cooperative with medications.  She seemed to sleep well through out the night.

## 2022-07-16 NOTE — Progress Notes (Signed)
[facility] MD Progress Note  07/16/2022 12:16 PM Cathy [name]  MRN:  [id number] Subjective: Follow-up patient with major depression.  77 year old woman with depression and lengthy hospitalization complicated by a fall with pelvic fracture.  Patient seen and chart reviewed.  Vital signs stable.  No new labs.  Patient continues to be on a one-to-one because of falls risk but her ambulation is improving.  She is still not eating very much.  Did not eat breakfast today and ate only a little bit of lunch.  Patient smiles but when asked acknowledges that she feels down much of the time and even at times feels like giving up.  No evidence psychosis. Principal Problem: Major depressive disorder, recurrent episode, severe ([place]) Diagnosis: Principal Problem:   Major depressive disorder, recurrent episode, severe (HCC) Active Problems:   Pelvic fracture ([place])   Inferior pubic ramus fracture, left, closed, initial encounter ([place])  Total Time spent with patient: 30 minutes  Past Psychiatric History: Past history of depression  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   Vision changes     Past Surgical History:  Procedure Laterality Date   CHOLECYSTECTOMY     IR IMAGING GUIDED PORT INSERTION  09/21/2020   LITHOTRIPSY  1984   LYMPH NODE BIOPSY     MASS BIOPSY Left 09/03/2020   Procedure: LEFT NECK JUGULAR NODE;  Surgeon: Rozetta Nunnery, MD;  Location: [place];  Service: ENT;  Laterality: Left;   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   Procedure: LEFT TOTAL KNEE ARTHROPLASTY;  Surgeon: Gaynelle Arabian, MD;  Location: [facility];  Service: Orthopedics;  Laterality: Left;   Family History:  Family History  Problem Relation Age of Onset   Hypertension Mother    Stroke Mother    Heart attack Mother        CABG, 5 Stents   Dementia Mother    Cancer Father        Kidney   Asthma Sister    Diabetes Brother        Borderline   Alcohol abuse Brother    Cancer Maternal Aunt        stomach ca   Cancer Maternal Aunt        ovarian ca   Breast cancer Cousin    Colon cancer Neg Hx    Family Psychiatric  History: See previous Social History:  Social History   Substance and Sexual Activity  Alcohol Use No     Social History   Substance and Sexual Activity  Drug Use No    Social History   Socioeconomic History   Marital status: Widowed    Spouse name: Not on file   Number of children: Not on file   Years of education: Not on file   Highest education level: Not on file  Occupational History  Not on file  Tobacco Use   Smoking status: Never   Smokeless tobacco: Never  Substance and Sexual Activity   Alcohol use: No   Drug use: No   Sexual activity: Not Currently    Birth control/protection: Post-menopausal  Other Topics Concern   Not on file  Social History Narrative   Lives alone.     Son is [name].     Social Determinants of Health   Financial Resource Strain: Not on file  Food Insecurity: No Food Insecurity (06/14/2022)   Hunger Vital Sign    Worried About Running Out of Food in the Last Year: Never true    Ran Out of Food in the Last Year: Never true  Transportation Needs: No Transportation Needs (06/14/2022)   PRAPARE - Hydrologist (Medical): No    Lack of Transportation (Non-Medical): No  Physical Activity: Not on file  Stress: Not on file  Social Connections: Not on  file   Additional Social History:                         Sleep: Fair  Appetite:  Poor  Current Medications: Current Facility-Administered Medications  Medication Dose Route Frequency Provider Last Rate Last Admin   (feeding supplement) PROSource Plus liquid 30 mL  30 mL Oral TID BM Parks Ranger, DO   30 mL at 07/15/22 1603   alum & mag hydroxide-simeth (MAALOX/MYLANTA) 200-200-20 MG/5ML suspension 30 mL  30 mL Oral Q4H PRN Parks Ranger, DO       aspirin EC tablet 81 mg  81 mg Oral Daily Parks Ranger, DO   81 mg at 07/16/22 1149   atorvastatin (LIPITOR) tablet 20 mg  20 mg Oral Daily Parks Ranger, DO   20 mg at 07/16/22 1148   cholecalciferol (VITAMIN D3) 25 MCG (1000 UNIT) tablet 1,000 Units  1,000 Units Oral Daily Parks Ranger, DO   1,000 Units at 07/16/22 1149   cyanocobalamin (VITAMIN B12) tablet 1,000 mcg  1,000 mcg Oral Daily Parks Ranger, DO   1,000 mcg at 07/16/22 1149   feeding supplement (ENSURE ENLIVE / ENSURE PLUS) liquid 237 mL  237 mL Oral TID BM Parks Ranger, DO   237 mL at 07/15/22 1603   haloperidol (HALDOL) tablet 1 mg  1 mg Oral QPC supper Parks Ranger, DO   1 mg at 07/15/22 1725   ibuprofen (ADVIL) tablet 400 mg  400 mg Oral Q6H PRN Dalen Hennessee T, MD   400 mg at 07/14/22 1805   lip balm (BLISTEX) ointment   Topical PRN Parks Ranger, DO       LORazepam (ATIVAN) tablet 0.5 mg  0.5 mg Oral Q6H PRN Parks Ranger, DO   0.5 mg at 07/08/22 1432   magnesium hydroxide (MILK OF MAGNESIA) suspension 30 mL  30 mL Oral Daily PRN Parks Ranger, DO   30 mL at 06/24/22 1006   mirtazapine (REMERON) tablet 7.5 mg  7.5 mg Oral QHS Parks Ranger, DO   7.5 mg at 07/15/22 2107   modafinil (PROVIGIL) tablet 200 mg  200 mg Oral Daily Parks Ranger, DO   200 mg at 07/16/22 1148   multivitamin with minerals tablet 1 tablet  1 tablet Oral Daily  Parks Ranger, DO   1 tablet at 07/16/22 1148   OLANZapine (ZYPREXA) tablet 5 mg  5 mg Oral  QHS Parks Ranger, DO   5 mg at 07/15/22 2108   oxyCODONE-acetaminophen (PERCOCET) 7.5-325 MG per tablet 1 tablet  1 tablet Oral Q6H PRN Shiori Adcox, [name], MD   1 tablet at 07/15/22 2201   pantoprazole (PROTONIX) EC tablet 40 mg  40 mg Oral Daily Parks Ranger, DO   40 mg at 07/16/22 1148   polyethylene glycol (MIRALAX / GLYCOLAX) packet 17 g  17 g Oral Daily Parks Ranger, DO   17 g at 07/16/22 1146   traZODone (DESYREL) tablet 25 mg  25 mg Oral QHS PRN Parks Ranger, DO       venlafaxine XR (EFFEXOR-XR) 24 hr capsule 300 mg  300 mg Oral Q breakfast Parks Ranger, DO   300 mg at 07/16/22 1159    Lab Results: No results found for this or any previous visit (from the past 48 hour(s)).  Blood Alcohol level:  Lab Results  Component Value Date   ETH <10 97/67/3419    Metabolic Disorder Labs: Lab Results  Component Value Date   HGBA1C 5.5 06/10/2022   MPG 111.15 06/10/2022   MPG 105 04/05/2021   No results found for: "PROLACTIN" Lab Results  Component Value Date   CHOL 246 (H) 06/10/2022   TRIG 94 06/10/2022   HDL 57 06/10/2022   CHOLHDL 4.3 06/10/2022   VLDL 19 06/10/2022   LDLCALC 170 (H) 06/10/2022   LDLCALC 67 04/05/2021    Physical Findings: AIMS:  , ,  ,  ,    CIWA:    COWS:     Musculoskeletal: Strength & Muscle Tone: decreased Gait & Station: unsteady Patient leans: Front  Psychiatric Specialty Exam:  Presentation  General Appearance:  Appropriate for Environment; Casual  Eye Contact: Good  Speech: Clear and Coherent; Normal Rate  Speech Volume: Normal  Handedness: Right   Mood and Affect  Mood: Dysphoric  Affect: Congruent   Thought Process  Thought Processes: Coherent  Descriptions of Associations:Intact  Orientation:Full (Time, Place and Person)  Thought Content:Logical  History of  Schizophrenia/Schizoaffective disorder:No  Duration of Psychotic Symptoms:No data recorded Hallucinations:No data recorded Ideas of Reference:None  Suicidal Thoughts:No data recorded Homicidal Thoughts:No data recorded  Sensorium  Memory: Immediate Good; Recent Good; Remote Good  Judgment: Good  Insight: Good   Executive Functions  Concentration: Good  Attention Span: Good  Recall: Good  Fund of Knowledge: Good  Language: Good   Psychomotor Activity  Psychomotor Activity:No data recorded  Assets  Assets: Physical Health; Resilience; Social Support; Catering manager; Housing   Sleep  Sleep:No data recorded   Physical Exam: Physical Exam Vitals and nursing note reviewed.  Constitutional:      Appearance: Normal appearance.  HENT:     Head: Normocephalic and atraumatic.     Mouth/Throat:     Pharynx: Oropharynx is clear.  Eyes:     Pupils: Pupils are equal, round, and reactive to light.  Cardiovascular:     Rate and Rhythm: Normal rate and regular rhythm.  Pulmonary:     Effort: Pulmonary effort is normal.     Breath sounds: Normal breath sounds.  Abdominal:     General: Abdomen is flat.     Palpations: Abdomen is soft.  Musculoskeletal:        General: Normal range of motion.     Comments: Continues to be impaired with ambulation due to the pelvic fracture.  Skin:    General: Skin is warm and dry.  Neurological:  General: No focal deficit present.     Mental Status: She is alert. Mental status is at baseline.  Psychiatric:        Attention and Perception: Attention normal.        Mood and Affect: Mood normal. Affect is blunt.        Speech: Speech is delayed.        Behavior: Behavior is slowed.        Thought Content: Thought content normal.        Cognition and Memory: Cognition is impaired.    Review of Systems  Constitutional: Negative.   HENT: Negative.    Eyes: Negative.   Respiratory: Negative.     Cardiovascular: Negative.   Gastrointestinal: Negative.   Musculoskeletal: Negative.   Skin: Negative.   Neurological: Negative.   Psychiatric/Behavioral:  Positive for depression. The patient is nervous/anxious.    Blood pressure (!) 123/55, pulse 88, temperature 98.8 F (37.1 C), resp. rate 15, height '5\' 2"'$  (1.575 m), SpO2 94 %. Body mass index is 20.01 kg/m.   Treatment Plan Summary: Medication management and Plan patient is on full dose antidepressant plus augmentation and has had therapeutic trial.  Continues to be depressed with poor appetite limited activity little that she is looking forward to.  No active suicidal intent identified but still seems hopeless.  I have been asked to discuss electroconvulsive therapy with this patient.  I think she would be a reasonable candidate if she would be agreeable.  I informed her of the consult and the nature of ECT described the procedure as it is done here and described potential risks and benefits.  Patient had only a couple of questions but seemed uncertain.  We will discuss it tomorrow.  Meanwhile no change to treatment at the moment.  Alethia Berthold, MD 07/16/2022, 12:16 PM

## 2022-07-16 NOTE — Consult Note (Signed)
ECT: See more detail in today's progress note.  77 year old woman with major depression who has had an extended hospitalization.  Has been on full dose therapeutic trial of antidepressant with minimal to partial improvement.  Continues to be dysphoric hopeless very poor appetite passive suicidal thoughts.  Occasional tearfulness.  Patient has no medical issue that would be a strict contraindication to ECT.  Head CT done last month largely unremarkable.  Labs and vital stable.  Patient would be a reasonable candidate for ECT.  I informed her of this and described risks and benefits.  Patient had a couple of minor questions but seemed a little confused.  We will discuss it again tomorrow.

## 2022-07-16 NOTE — Progress Notes (Signed)
1:1 Rounding:  8403 - Patient awake briefly for vitals and assessment. Did not eat breakfast. Sitter at side.  0830 - Patient in bed. Sitter at side.  0930 - Patient in bed. Sitter at side.  1030 - Patient in bed. Respirations even/unlabored. Sitter at side.  1130 - Patient awake getting ready for lunch.  1230 - Patient doing crossword puzzles in the dayroom. Sitter at side.  1330 - Patient in bed. Sitter at side.  1430 - Patient in bed. Sitter at side.  1530 - Patient in bed. Sitter at side. No distress noted.  1630 - Patient is consuming dinner. Sitter at side.  1730 - Patient in the dayroom interacting with sitter.  1830 - Patient in the dayroom getting ready for a visitor.  Patient is A+O x3. Appears to be disoriented by time. She denies SI/HI/AVH. Denies pain. Appetite fair. Medication compliant. Patient says that she had a good day.

## 2022-07-16 NOTE — BH Assessment (Signed)
1900 Received patient sitting ain the dayroom visiting with her son. She is smiling and appear to be enjoying the visit. She remains a one on one safety risk and one on one staff is in place. Will continue to  monitor patient for safety.  1945 Patient offered a snack and she decline but accepted water to drink.   2130 Patient has been alert and oriented x 4, she is currently denying SI/HI, A/V hallucinations, anxiety, and pain. She is able to ambulate with supervision.   0015 Patient is resting quietly in bed. One on one safety staff at bedside and patient has remained safe thus far on this shift.  0430 Patient continues to rest quietly in bed with one on one staff at bedside. Will continue to monitor for safety.  0630 Patient has remained safe throughout this shift. Will continue to monitor for safety.

## 2022-07-16 NOTE — [facility] Group Notes (Signed)
[facility] Group Notes:  (Nursing/MHT/Case Management/Adjunct)  Date:  07/16/2022  Time:  5:25 PM  Type of Therapy:  Group Therapy  Participation Level:  Active  Participation Quality:  Appropriate  Affect:  Appropriate  Cognitive:  Alert  Insight:  Good  Engagement in Group:  Engaged  Modes of Intervention:  Discussion  Summary of Progress/Problems: List 2 things that you have learned during your stay...   Ileene Musa 07/16/2022, 5:25 PM

## 2022-07-17 DIAGNOSIS — F332 Major depressive disorder, recurrent severe without psychotic features: Secondary | ICD-10-CM | POA: Diagnosis not present

## 2022-07-17 NOTE — Group Note (Signed)
[facility] LCSW Group Therapy Note   Group Date: 07/17/2022 Start Time: 1100 End Time: 1200   Type of Therapy and Topic: Group Therapy: Avoiding Self-Sabotaging and Enabling Behaviors  Participation Level: None  Mood:  Description of Group:  In this group, patients will learn how to identify obstacles, self-sabotaging and enabling behaviors, as well as: what are they, why do we do them and what needs these behaviors meet. Discuss unhealthy relationships and how to have positive healthy boundaries with those that sabotage and enable. Explore aspects of self-sabotage and enabling in yourself and how to limit these self-destructive behaviors in everyday life.   Therapeutic Goals: 1. Patient will identify one obstacle that relates to self-sabotage and enabling behaviors 2. Patient will identify one personal self-sabotaging or enabling behavior they did prior to admission 3. Patient will state a plan to change the above identified behavior 4. Patient will demonstrate ability to communicate their needs through discussion and/or role play.    Summary of Patient Progress:   Patient was not appropriate for group   Therapeutic Modalities:  Cognitive Behavioral Therapy Person-Centered Therapy Motivational Interviewing    Lubertha South, LCSW

## 2022-07-17 NOTE — Group Note (Signed)
LCSW Group Therapy Note  Group Date: 07/16/2022 Start Time: 1000 End Time: 1100   Type of Therapy and Topic:  Group Therapy: Positive Affirmations  Participation Level:  None   Description of Group:   This group addressed positive affirmation towards self and others.  Patients went around the room and identified two positive things about themselves and two positive things about a peer in the room.  Patients reflected on how it felt to share something positive with others, to identify positive things about themselves, and to hear positive things from others/ Patients were encouraged to have a daily reflection of positive characteristics or circumstances.   Therapeutic Goals: Patients will verbalize two of their positive qualities Patients will demonstrate empathy for others by stating two positive qualities about a peer in the group Patients will verbalize their feelings when voicing positive self affirmations and when voicing positive affirmations of others Patients will discuss the potential positive impact on their wellness/recovery of focusing on positive traits of self and others.  Summary of Patient Progress:  was not appropriate  Therapeutic Modalities:   Cognitive [place], [place] 07/17/2022  3:43 PM

## 2022-07-17 NOTE — Tx Team (Signed)
Interdisciplinary Treatment and Diagnostic Plan Update  07/17/2022 Time of Session: 9:00 RAIVYN KABLER MRN: [id number]  Principal Diagnosis: Major depressive disorder, recurrent episode, severe ([place])  Secondary Diagnoses: Principal Problem:   Major depressive disorder, recurrent episode, severe ([place]) Active Problems:   Pelvic fracture ([place])   Inferior pubic ramus fracture, left, closed, initial encounter ([place])   Current Medications:  Current Facility-Administered Medications  Medication Dose Route Frequency Provider Last Rate Last Admin   (feeding supplement) PROSource Plus liquid 30 mL  30 mL Oral TID BM Parks Ranger, DO   30 mL at 07/17/22 0917   alum & mag hydroxide-simeth (MAALOX/MYLANTA) 200-200-20 MG/5ML suspension 30 mL  30 mL Oral Q4H PRN Parks Ranger, DO       aspirin EC tablet 81 mg  81 mg Oral Daily Parks Ranger, DO   81 mg at 07/17/22 4132   atorvastatin (LIPITOR) tablet 20 mg  20 mg Oral Daily Parks Ranger, DO   20 mg at 07/17/22 0915   cholecalciferol (VITAMIN D3) 25 MCG (1000 UNIT) tablet 1,000 Units  1,000 Units Oral Daily Parks Ranger, DO   1,000 Units at 07/17/22 4401   cyanocobalamin (VITAMIN B12) tablet 1,000 mcg  1,000 mcg Oral Daily Parks Ranger, DO   1,000 mcg at 07/17/22 0272   feeding supplement (ENSURE ENLIVE / ENSURE PLUS) liquid 237 mL  237 mL Oral TID BM Parks Ranger, DO   237 mL at 07/15/22 1603   haloperidol (HALDOL) tablet 1 mg  1 mg Oral QPC supper Parks Ranger, DO   1 mg at 07/16/22 1710   ibuprofen (ADVIL) tablet 400 mg  400 mg Oral Q6H PRN [name], [name], MD   400 mg at 07/14/22 1805   lip balm (BLISTEX) ointment   Topical PRN Parks Ranger, DO       LORazepam (ATIVAN) tablet 0.5 mg  0.5 mg Oral Q6H PRN Parks Ranger, DO   0.5 mg at 07/08/22 1432   magnesium hydroxide (MILK OF MAGNESIA) suspension 30 mL  30 mL Oral Daily PRN Parks Ranger, DO   30 mL at 06/24/22 1006   mirtazapine (REMERON) tablet 7.5 mg  7.5 mg Oral QHS Parks Ranger, DO   7.5 mg at 07/16/22 2040   modafinil (PROVIGIL) tablet 200 mg  200 mg Oral Daily Parks Ranger, DO   200 mg at 07/17/22 5366   multivitamin with minerals tablet 1 tablet  1 tablet Oral Daily Parks Ranger, DO   1 tablet at 07/17/22 0915   OLANZapine (ZYPREXA) tablet 5 mg  5 mg Oral QHS Parks Ranger, DO   5 mg at 07/16/22 2041   oxyCODONE-acetaminophen (PERCOCET) 7.5-325 MG per tablet 1 tablet  1 tablet Oral Q6H PRN [name], [name], MD   1 tablet at 07/15/22 2201   pantoprazole (PROTONIX) EC tablet 40 mg  40 mg Oral Daily Parks Ranger, DO   40 mg at 07/17/22 0916   polyethylene glycol (MIRALAX / GLYCOLAX) packet 17 g  17 g Oral Daily Parks Ranger, DO   17 g at 07/17/22 0915   traZODone (DESYREL) tablet 25 mg  25 mg Oral QHS PRN Parks Ranger, DO       venlafaxine XR (EFFEXOR-XR) 24 hr capsule 300 mg  300 mg Oral Q breakfast Parks Ranger, DO   300 mg at 07/17/22 0915   PTA Medications: Medications Prior to Admission  Medication Sig Dispense Refill Last Dose   albuterol (VENTOLIN HFA) 108 (90 Base) MCG/ACT inhaler Inhale 2 puffs into the lungs every 6 (six) hours as needed for wheezing or shortness of breath. 8 g 2    aspirin EC 81 MG tablet Take 81 mg by mouth daily.      atorvastatin (LIPITOR) 20 MG tablet Take 1 tablet (20 mg total) by mouth daily. 90 tablet 1    cetirizine (ZYRTEC) 10 MG tablet Take 1 tablet (10 mg total) by mouth daily. 90 tablet 1    cholecalciferol (CHOLECALCIFEROL) 25 MCG tablet Take 1 tablet (1,000 Units total) by mouth daily.      cyanocobalamin (VITAMIN B12) 1000 MCG tablet Take 1 tablet (1,000 mcg total) by mouth daily.      denosumab (PROLIA) 60 MG/ML SOSY injection Inject 60 mg into the skin every 6 (six) months.      estradiol (ESTRACE) 0.1 MG/GM vaginal cream Discard plastic  applicator. Insert blueberry size amount of cream on finger in vagina daily x1 week then 2x per week.      fluticasone furoate-vilanterol (BREO ELLIPTA) 100-25 MCG/ACT AEPB Inhale 1 puff into the lungs daily. (Patient taking differently: Inhale 1 puff into the lungs daily as needed.) 60 each 5    glucose blood (ACCU-CHEK AVIVA PLUS) test strip Use to Monitor blood sugars daily. DX. E11.9 100 each 5    hydrochlorothiazide (MICROZIDE) 12.5 MG capsule Take 1 capsule (12.5 mg total) by mouth daily. 90 capsule 1    hydroxypropyl methylcellulose / hypromellose (ISOPTO TEARS / GONIOVISC) 2.5 % ophthalmic solution Place 2 drops into both eyes 3 (three) times daily as needed for dry eyes.      lidocaine-prilocaine (EMLA) cream Apply to affected area once (Patient taking differently: 1 Application as needed. Apply to affected area to port as needed) 30 g 3    losartan (COZAAR) 100 MG tablet TAKE 1 TABLET BY MOUTH ONCE A DAY 90 tablet 1    metoprolol succinate (TOPROL-XL) 50 MG 24 hr tablet Take 1 tablet (50 mg total) by mouth daily. Take with or immediately following a meal. 90 tablet 1    Multiple Vitamin (MULTIVITAMIN) capsule Take 1 capsule by mouth daily.      omeprazole (PRILOSEC) 20 MG capsule Take 1 capsule (20 mg total) by mouth daily. 90 capsule 1    venlafaxine XR (EFFEXOR XR) 75 MG 24 hr capsule Take 2 capsules (150 mg total) by mouth daily with breakfast. (Patient taking differently: Take 75 mg by mouth daily with breakfast.)       Patient Stressors:    Patient Strengths:    Treatment Modalities: Medication Management, Group therapy, Case management,  1 to 1 session with clinician, Psychoeducation, Recreational therapy.   Physician Treatment Plan for Primary Diagnosis: Major depressive disorder, recurrent episode, severe ([place]) Long Term Goal(s): Improvement in symptoms so as ready for discharge   Short Term Goals: Ability to identify changes in lifestyle to reduce recurrence of condition  will improve Ability to verbalize feelings will improve Ability to disclose and discuss suicidal ideas Ability to demonstrate self-control will improve Ability to identify and develop effective coping behaviors will improve Ability to maintain clinical measurements within normal limits will improve Compliance with prescribed medications will improve Ability to identify triggers associated with substance abuse/mental health issues will improve  Medication Management: Evaluate patient's response, side effects, and tolerance of medication regimen.  Therapeutic Interventions: 1 to 1 sessions, Unit Group sessions and Medication administration.  Evaluation of Outcomes: Not Met  Physician Treatment Plan for Secondary Diagnosis: Principal Problem:   Major depressive disorder, recurrent episode, severe ([place]) Active Problems:   Pelvic fracture ([facility])   Inferior pubic ramus fracture, left, closed, initial encounter ([place])  Long Term Goal(s): Improvement in symptoms so as ready for discharge   Short Term Goals: Ability to identify changes in lifestyle to reduce recurrence of condition will improve Ability to verbalize feelings will improve Ability to disclose and discuss suicidal ideas Ability to demonstrate self-control will improve Ability to identify and develop effective coping behaviors will improve Ability to maintain clinical measurements within normal limits will improve Compliance with prescribed medications will improve Ability to identify triggers associated with substance abuse/mental health issues will improve     Medication Management: Evaluate patient's response, side effects, and tolerance of medication regimen.  Therapeutic Interventions: 1 to 1 sessions, Unit Group sessions and Medication administration.  Evaluation of Outcomes: Not Met   RN Treatment Plan for Primary Diagnosis: Major depressive disorder, recurrent episode, severe ([place]) Long Term Goal(s): Knowledge of  disease and therapeutic regimen to maintain health will improve  Short Term Goals: Ability to verbalize feelings will improve, Ability to identify and develop effective coping behaviors will improve, and Compliance with prescribed medications will improve  Medication Management: RN will administer medications as ordered by provider, will assess and evaluate patient's response and provide education to patient for prescribed medication. RN will report any adverse and/or side effects to prescribing provider.  Therapeutic Interventions: 1 on 1 counseling sessions, Psychoeducation, Medication administration, Evaluate responses to treatment, Monitor vital signs and CBGs as ordered, Perform/monitor CIWA, COWS, AIMS and Fall Risk screenings as ordered, Perform wound care treatments as ordered.  Evaluation of Outcomes: Not Met   LCSW Treatment Plan for Primary Diagnosis: Major depressive disorder, recurrent episode, severe ([place]) Long Term Goal(s): Safe transition to appropriate next level of care at discharge, Engage patient in therapeutic group addressing interpersonal concerns.  Short Term Goals: Engage patient in aftercare planning with referrals and resources and Facilitate patient progression through stages of change regarding substance use diagnoses and concerns  Therapeutic Interventions: Assess for all discharge needs, 1 to 1 time with Social worker, Explore available resources and support systems, Assess for adequacy in community support network, Educate family and significant other(s) on suicide prevention, Complete Psychosocial Assessment, Interpersonal group therapy.  Evaluation of Outcomes: Not Met   Progress in Treatment: Attending groups: No. Participating in groups: Yes. Taking medication as prescribed: Yes. Toleration medication: Yes. Family/Significant other contact made: No, will contact:    Patient understands diagnosis: No. Discussing patient identified problems/goals with  staff: Yes. Medical problems stabilized or resolved: Yes. Denies suicidal/homicidal ideation: Yes. Issues/concerns per patient self-inventory: No. Other:   New problem(s) identified: No, Describe:  not at this time  New Short Term/Long Term Goal(s):  Patient Goals:  stabilization as to discharge when appropriate  Discharge Plan or Barriers:   Reason for Continuation of Hospitalization: Other; describe remains depressed  Estimated Length of Stay:unknown  Last 3 [place] Suicide Severity Risk Score: Flowsheet Row Admission (Current) from 06/14/2022 in [place] ED from 06/10/2022 in [facility] Admission (Discharged) from 09/03/2020 in [place] High Risk No Risk No Risk       Last PHQ 2/9 Scores:    08/10/2020    9:35 AM 01/13/2020    3:59 PM 07/16/2019    9:36 AM  Depression screen PHQ 2/9  Decreased  Interest 0 0 0  Down, Depressed, Hopeless 0 0 0  PHQ - 2 Score 0 0 0    Scribe for Treatment Team: Lubertha South, Marlinda Mike 07/17/2022 3:47 PM

## 2022-07-17 NOTE — Progress Notes (Signed)
[facility] MD Progress Note  07/17/2022 2:04 PM Cathy [name]  MRN:  [id number] Subjective: Patient seen for follow-up.  76 year old woman with major depression.  Patient was in bed but awake and reading.  Continues to be able to discuss how she is anxious and depressed and feels somewhat hopeless at times.  No active suicidal intent but vague hopelessness.  Patient remembered having a conversation about ECT yesterday.  She told me she did not feel comfortable with that she was frightened.  I offered to answer questions as much as possible for her. Principal Problem: Major depressive disorder, recurrent episode, severe ([place]) Diagnosis: Principal Problem:   Major depressive disorder, recurrent episode, severe ([facility]) Active Problems:   Pelvic fracture ([place])   Inferior pubic ramus fracture, left, closed, initial encounter ([place])  Total Time spent with patient: 30 minutes  Past Psychiatric History: Past history of recurrent depression and anxiety  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   Vision changes     Past Surgical History:  Procedure Laterality Date   CHOLECYSTECTOMY     IR IMAGING GUIDED PORT INSERTION  09/21/2020   LITHOTRIPSY  1984   LYMPH NODE BIOPSY     MASS BIOPSY Left 09/03/2020   Procedure: LEFT NECK JUGULAR NODE;  Surgeon: Rozetta Nunnery, MD;  Location: [facility];  Service: ENT;  Laterality: Left;   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   Procedure: LEFT TOTAL KNEE ARTHROPLASTY;  Surgeon: Gaynelle Arabian, MD;  Location: [facility];  Service: Orthopedics;  Laterality: Left;   Family History:  Family History  Problem Relation Age of Onset   Hypertension Mother    Stroke Mother    Heart attack Mother        CABG, 5 Stents   Dementia Mother    Cancer Father        Kidney   Asthma Sister    Diabetes Brother        Borderline   Alcohol abuse Brother    Cancer Maternal Aunt        stomach ca   Cancer Maternal Aunt        ovarian ca   Breast cancer Cousin    Colon cancer Neg Hx    Family Psychiatric  History: See previous Social History:  Social History   Substance and Sexual Activity  Alcohol Use No     Social History   Substance and Sexual Activity  Drug Use No    Social History   Socioeconomic History   Marital status: Widowed    Spouse name: Not on file   Number of children: Not on file   Years of education: Not on file   Highest education level: Not on file  Occupational History   Not on file  Tobacco Use   Smoking status: Never   Smokeless tobacco: Never  Substance and Sexual Activity   Alcohol use: No   Drug use: No   Sexual activity: Not Currently    Birth control/protection: Post-menopausal  Other Topics Concern   Not on file  Social History Narrative   Lives alone.     Son is [name].     Social Determinants of Health   Financial Resource Strain: Not on file  Food Insecurity: No Food Insecurity (06/14/2022)   Hunger Vital Sign    Worried About Running Out of Food in the Last Year: Never true    Ran Out of Food in the Last Year: Never true  Transportation Needs: No Transportation Needs (06/14/2022)   PRAPARE - Hydrologist (Medical): No    Lack of Transportation (Non-Medical): No  Physical Activity: Not on file  Stress: Not on file  Social Connections: Not on file   Additional Social History:                          Sleep: Fair  Appetite:  Poor  Current Medications: Current Facility-Administered Medications  Medication Dose Route Frequency Provider Last Rate Last Admin   (feeding supplement) PROSource Plus liquid 30 mL  30 mL Oral TID BM Parks Ranger, DO   30 mL at 07/17/22 0917   alum & mag hydroxide-simeth (MAALOX/MYLANTA) 200-200-20 MG/5ML suspension 30 mL  30 mL Oral Q4H PRN Parks Ranger, DO       aspirin EC tablet 81 mg  81 mg Oral Daily Parks Ranger, DO   81 mg at 07/17/22 0916   atorvastatin (LIPITOR) tablet 20 mg  20 mg Oral Daily Parks Ranger, DO   20 mg at 07/17/22 0915   cholecalciferol (VITAMIN D3) 25 MCG (1000 UNIT) tablet 1,000 Units  1,000 Units Oral Daily Parks Ranger, DO   1,000 Units at 07/17/22 8657   cyanocobalamin (VITAMIN B12) tablet 1,000 mcg  1,000 mcg Oral Daily Parks Ranger, DO   1,000 mcg at 07/17/22 8469   feeding supplement (ENSURE ENLIVE / ENSURE PLUS) liquid 237 mL  237 mL Oral TID BM Parks Ranger, DO   237 mL at 07/15/22 1603   haloperidol (HALDOL) tablet 1 mg  1 mg Oral QPC supper Parks Ranger, DO   1 mg at 07/16/22 1710   ibuprofen (ADVIL) tablet 400 mg  400 mg Oral Q6H PRN Estefana Taylor T, MD   400 mg at 07/14/22 1805   lip balm (BLISTEX) ointment   Topical PRN Parks Ranger, DO       LORazepam (ATIVAN) tablet 0.5 mg  0.5 mg Oral Q6H PRN Parks Ranger, DO   0.5 mg at 07/08/22 1432   magnesium hydroxide (MILK OF MAGNESIA) suspension 30 mL  30 mL Oral Daily PRN Parks Ranger, DO   30 mL at 06/24/22 1006   mirtazapine (REMERON) tablet 7.5 mg  7.5 mg Oral QHS Parks Ranger, DO   7.5 mg at 07/16/22 2040   modafinil (PROVIGIL) tablet 200 mg  200 mg Oral Daily Parks Ranger, DO   200 mg at 07/17/22 6295   multivitamin with minerals tablet 1 tablet  1 tablet Oral Daily Parks Ranger, DO   1 tablet at 07/17/22 0915   OLANZapine  (ZYPREXA) tablet 5 mg  5 mg Oral QHS Parks Ranger, DO   5 mg at 07/16/22 2041   oxyCODONE-acetaminophen (PERCOCET) 7.5-325  MG per tablet 1 tablet  1 tablet Oral Q6H PRN Chasity Outten, [name], MD   1 tablet at 07/15/22 2201   pantoprazole (PROTONIX) EC tablet 40 mg  40 mg Oral Daily Parks Ranger, DO   40 mg at 07/17/22 8841   polyethylene glycol (MIRALAX / GLYCOLAX) packet 17 g  17 g Oral Daily Parks Ranger, DO   17 g at 07/17/22 0915   traZODone (DESYREL) tablet 25 mg  25 mg Oral QHS PRN Parks Ranger, DO       venlafaxine XR (EFFEXOR-XR) 24 hr capsule 300 mg  300 mg Oral Q breakfast Parks Ranger, DO   300 mg at 07/17/22 6606    Lab Results: No results found for this or any previous visit (from the past 58 hour(s)).  Blood Alcohol level:  Lab Results  Component Value Date   ETH <10 30/16/0109    Metabolic Disorder Labs: Lab Results  Component Value Date   HGBA1C 5.5 06/10/2022   MPG 111.15 06/10/2022   MPG 105 04/05/2021   No results found for: "PROLACTIN" Lab Results  Component Value Date   CHOL 246 (H) 06/10/2022   TRIG 94 06/10/2022   HDL 57 06/10/2022   CHOLHDL 4.3 06/10/2022   VLDL 19 06/10/2022   LDLCALC 170 (H) 06/10/2022   LDLCALC 67 04/05/2021    Physical Findings: AIMS:  , ,  ,  ,    CIWA:    COWS:     Musculoskeletal: Strength & Muscle Tone: within normal limits Gait & Station: normal Patient leans: N/A  Psychiatric Specialty Exam:  Presentation  General Appearance:  Appropriate for Environment; Casual  Eye Contact: Good  Speech: Clear and Coherent; Normal Rate  Speech Volume: Normal  Handedness: Right   Mood and Affect  Mood: Dysphoric  Affect: Congruent   Thought Process  Thought Processes: Coherent  Descriptions of Associations:Intact  Orientation:Full (Time, Place and Person)  Thought Content:Logical  History of Schizophrenia/Schizoaffective disorder:No  Duration of  Psychotic Symptoms:No data recorded Hallucinations:No data recorded Ideas of Reference:None  Suicidal Thoughts:No data recorded Homicidal Thoughts:No data recorded  Sensorium  Memory: Immediate Good; Recent Good; Remote Good  Judgment: Good  Insight: Good   Executive Functions  Concentration: Good  Attention Span: Good  Recall: Good  Fund of Knowledge: Good  Language: Good   Psychomotor Activity  Psychomotor Activity:No data recorded  Assets  Assets: Physical Health; Resilience; Social Support; Catering manager; Housing   Sleep  Sleep:No data recorded   Physical Exam: Physical Exam Vitals and nursing note reviewed.  Constitutional:      Appearance: Normal appearance.  HENT:     Head: Normocephalic and atraumatic.     Mouth/Throat:     Pharynx: Oropharynx is clear.  Eyes:     Pupils: Pupils are equal, round, and reactive to light.  Cardiovascular:     Rate and Rhythm: Normal rate and regular rhythm.  Pulmonary:     Effort: Pulmonary effort is normal.     Breath sounds: Normal breath sounds.  Abdominal:     General: Abdomen is flat.     Palpations: Abdomen is soft.  Musculoskeletal:        General: Normal range of motion.  Skin:    General: Skin is warm and dry.  Neurological:     General: No focal deficit present.     Mental Status: She is alert. Mental status is at baseline.  Psychiatric:  Attention and Perception: Attention normal.        Mood and Affect: Mood is anxious and depressed.        Speech: Speech normal.        Behavior: Behavior is cooperative.        Thought Content: Thought content normal.        Cognition and Memory: Cognition normal.    Review of Systems  Constitutional: Negative.   HENT: Negative.    Eyes: Negative.   Respiratory: Negative.    Cardiovascular: Negative.   Gastrointestinal: Negative.   Musculoskeletal: Negative.   Skin: Negative.   Neurological: Negative.    Psychiatric/Behavioral:  Positive for depression. Negative for hallucinations, substance abuse and suicidal ideas. The patient is nervous/anxious.    Blood pressure 113/67, pulse 92, temperature 98.8 F (37.1 C), temperature source Oral, resp. rate 18, height '5\' 2"'$  (1.575 m), SpO2 96 %. Body mass index is 20.01 kg/m.   Treatment Plan Summary: Medication management and Plan no change to medication.  Patient is still on one-to-one because of concern for falls given her recent fall and fracture of her pelvis.  Patient is not willing to give consent for ECT tomorrow so she wants to "wait a little bit".  I reassured her that was okay and that I will continue to stay in touch with her treatment team and we can continue to discuss it later.  Alethia Berthold, MD 07/17/2022, 2:04 PM

## 2022-07-17 NOTE — Plan of Care (Signed)
  Problem: Education: Goal: Knowledge of General Education information will improve Description: Including pain rating scale, medication(s)/side effects and non-pharmacologic comfort measures Outcome: Progressing   Problem: Health Behavior/Discharge Planning: Goal: Ability to manage health-related needs will improve Outcome: Progressing   Problem: Coping: Goal: Level of anxiety will decrease Outcome: Progressing   Problem: Skin Integrity: Goal: Risk for impaired skin integrity will decrease Outcome: Not Progressing

## 2022-07-17 NOTE — Progress Notes (Signed)
Patient is alert and disoriented by time. She denies SI/HI/AVH. Apprehensive affect. Sullen facial expression. Engagement in active listening. Pain observed this morning but patient did not want to take a PRN med. Appetite fair. She spent time in her room reading magazines.  Patient remains on a 1-1 with sitter.

## 2022-07-18 DIAGNOSIS — F332 Major depressive disorder, recurrent severe without psychotic features: Secondary | ICD-10-CM | POA: Diagnosis not present

## 2022-07-18 NOTE — Progress Notes (Signed)
2045 - D: Pt alert and oriented. Pt denies experiencing any anxiety/depression at this time. Pt denies experiencing any pain at this time. Pt denies experiencing any SI/HI, or AVH at this time.    A: Scheduled medications administered as prescribed. Support and encouragement provided. Assigned 1:1 staff present at all times. Routine safety checks conducted q15 minutes. 0600 - Hygiene completed w/ assigned staff assist.  R: No adverse drug reactions noted. Pt is agreeable to notifying staff with any safety need. Pt complaint with medications. Pt interacts minimally with others on the unit. Pt remains safe at this time, will continue plan of care.

## 2022-07-18 NOTE — BHH Counselor (Signed)
CSW called and spoke with [place] at [employer].  CSW asked for Kenney Houseman to reconsider patient.  CSW pointed out that patient does not have behaviors.  CSW reported that pt is medication compliant, redirectable and has been overall pleasant on the unit.    CSW resent the requested information.  Assunta Curtis, MSW, LCSW 07/18/2022 10:41 AM

## 2022-07-18 NOTE — BHH Counselor (Addendum)
[facility] reports no available beds at this time.  Assunta Curtis, MSW, LCSW 07/18/2022 2:10 PM     CSW re-sent information via the [place].  CSW awaiting responses.  Destination  Service Provider Request Status Selected Services Address Phone Fax Patient Preferred  [facility]  Pending - Request Sent N/A [address] [place] [address] [phone] --  [facility]  Pending - Request Sent N/A [address], [place] [address] [phone] --  [facility]  Pending - Request Sent N/A [address], [place] [address] [phone] --  [facility]  Pending - Request Sent N/A [address], [place] [address] [phone] --  [facility]  Pending - Request Sent N/A [address], [place] [address] [phone] --  [facility]  Pending - Request Sent N/A [address], [place] [address] [phone] --  [facility]  Pending - Request Sent N/A [address], [place] [address] [phone] --  [facility]  Pending - Request Sent N/A [address] [place] [address] [phone] --  [facility]erred [facility]  Pending - Request Sent N/A [address] [place], [place] [address] [phone] --  [facility], MSW, [place] 07/18/2022 10:39 AM

## 2022-07-18 NOTE — Progress Notes (Signed)
Physical Therapy Treatment Patient Details Name: Cathy [name] MRN: [id number] DOB: 11-24-1945 Today's Date: 07/18/2022   History of Present Illness Pt is a 77 y.o. female presenting "voluntarily to [facility] reporting worsening depressive symptoms and suicidal ideation" 06/10/22; transferred to [facility] 06/14/22.  Unwitnessed fall 06/18/22 hitting head. Pt now with fall 07/01/22 and diagnosed with left inferior superior pubic ramus fracture and is WBAT per ortho consult note on 1/20.   PMH includes htn, vision changes, L TKA, lymphoma, depression, anxiety.    PT Comments    Pt was pleasant and motivated to participate during the session and put forth good effort throughout. Goals assessed and updated this session.  Pt continued to present with posterior LOB during below balance training activities most notably during activities with eyes closed. Pt did present with grossly improved stability in standing after anterior weight shifting activities.  Pt generally steady with gait with a RW except for during sharp turns or stepping backwards where she required min A for stability.  Pt will benefit from PT services in a SNF setting upon discharge to safely address deficits listed in patient problem list for decreased caregiver assistance and eventual return to PLOF.     Recommendations for follow up therapy are one component of a multi-disciplinary discharge planning process, led by the attending physician.  Recommendations may be updated based on patient status, additional functional criteria and insurance authorization.  Follow Up Recommendations  Skilled nursing-short term rehab (<3 hours/day) Can patient physically be transported by private vehicle: Yes   Assistance Recommended at Discharge Frequent or constant Supervision/Assistance  Patient can return home with the following A little help with walking and/or transfers;A little help with bathing/dressing/bathroom;Assistance with cooking/housework;Direct  supervision/assist for medications management;Direct supervision/assist for financial management;Assist for transportation;Help with stairs or ramp for entrance   Equipment Recommendations  Rolling walker (2 wheels)    Recommendations for Other Services       Precautions / Restrictions Precautions Precautions: Fall Restrictions Weight Bearing Restrictions: Yes LLE Weight Bearing: Weight bearing as tolerated     Mobility  Bed Mobility               General bed mobility comments: NT, pt in recliner    Transfers Overall transfer level: Needs assistance Equipment used: Rolling walker (2 wheels) Transfers: Sit to/from Stand Sit to Stand: Supervision           General transfer comment: Min verbal cues for hand placement with fair eccentric and concentric control    Ambulation/Gait Ambulation/Gait assistance: Min assist Gait Distance (Feet): 100 Feet Assistive device: Rolling walker (2 wheels) Gait Pattern/deviations: Step-through pattern, Antalgic, Decreased stance time - left, Decreased step length - right Gait velocity: decreased     General Gait Details: Mildly antalgic gait on the LLE with min A to prevent posterior LOB x 1   Stairs             Wheelchair Mobility    Modified Rankin (Stroke Patients Only)       Balance Overall balance assessment: Needs assistance, History of Falls Sitting-balance support: No upper extremity supported, Feet supported Sitting balance-Leahy Scale: Normal     Standing balance support: During functional activity, No upper extremity supported, Bilateral upper extremity supported Standing balance-Leahy Scale: Poor Standing balance comment: Frequent posterior LOB during standing balance training, occasional min LOB during ambulation  Cognition Arousal/Alertness: Awake/alert Behavior During Therapy: WFL for tasks assessed/performed Overall Cognitive Status: Within Functional  Limits for tasks assessed                                          Exercises Total Joint Exercises Long Arc Quad: Strengthening, Both, 10 reps (with manual resistance) Knee Flexion: Strengthening, Both, 10 reps Other Exercises: anterior weight shifting activities in standing to address posterior instability  Other Exercises: Dynamic unsupported standing balance training with reaching outside BOS with feet apart, together, and semi-tandem Other Exercises: Static unsupported standing balance training with feet apart, together, and semi-tandem with combinations of eyes open/closed and head still/head turns Other Exercises: 5 time sit to stand with controlled eccentric phase    General Comments        Pertinent Vitals/Pain Pain Assessment Pain Assessment: 0-10 Pain Score: 5  Pain Location: L hip Pain Descriptors / Indicators: Sore Pain Intervention(s): Repositioned, Monitored during session, Other (comment) (pt declined pain medication)    Home Living                          Prior Function            PT Goals (current goals can now be found in the care plan section) Progress towards PT goals: Progressing toward goals    Frequency    Min 2X/week      PT Plan Current plan remains appropriate    Co-evaluation              AM-PAC PT "6 Clicks" Mobility   Outcome Measure  Help needed turning from your back to your side while in a flat bed without using bedrails?: A Little Help needed moving from lying on your back to sitting on the side of a flat bed without using bedrails?: A Little Help needed moving to and from a bed to a chair (including a wheelchair)?: A Little Help needed standing up from a chair using your arms (e.g., wheelchair or bedside chair)?: A Little Help needed to walk in hospital room?: A Little Help needed climbing 3-5 steps with a railing? : A Little 6 Click Score: 18    End of Session Equipment Utilized During  Treatment: Gait belt Activity Tolerance: Patient tolerated treatment well Patient left: in chair;with nursing/sitter in room Nurse Communication: Mobility status PT Visit Diagnosis: Unsteadiness on feet (R26.81);History of falling (Z91.81);Other abnormalities of gait and mobility (R26.89);Muscle weakness (generalized) (M62.81);Pain Pain - Right/Left: Left Pain - part of body: Hip     Time: 2010-0712 PT Time Calculation (min) (ACUTE ONLY): 23 min  Charges:  $Gait Training: 8-22 mins $Therapeutic Exercise: 8-22 mins                     D. Scott Montrelle Eddings PT, DPT 07/18/22, 12:05 PM

## 2022-07-18 NOTE — Group Note (Signed)
[facility] LCSW Group Therapy Note    Group Date: 07/18/2022 Start Time: 1300 End Time: 1400  Type of Therapy and Topic:  Group Therapy:  Overcoming Obstacles  Participation Level:  BHH PARTICIPATION LEVEL: Active  Mood:  Description of Group:   In this group patients will be encouraged to explore what they see as obstacles to their own wellness and recovery. They will be guided to discuss their thoughts, feelings, and behaviors related to these obstacles. The group will process together ways to cope with barriers, with attention given to specific choices patients can make. Each patient will be challenged to identify changes they are motivated to make in order to overcome their obstacles. This group will be process-oriented, with patients participating in exploration of their own experiences as well as giving and receiving support and challenge from other group members.  Therapeutic Goals: 1. Patient will identify personal and current obstacles as they relate to admission. 2. Patient will identify barriers that currently interfere with their wellness or overcoming obstacles.  3. Patient will identify feelings, thought process and behaviors related to these barriers. 4. Patient will identify two changes they are willing to make to overcome these obstacles:    Summary of Patient Progress Patient was active and attentive.  Patient was anxious throughout group.  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  CSW and RN were able to help support patient and encourage her to use coping skills.   Therapeutic Modalities:   Cognitive Behavioral Therapy Solution Focused Therapy Motivational Interviewing Relapse Prevention Therapy   [name], LCSW

## 2022-07-18 NOTE — Progress Notes (Signed)
Patient is A+O x 3 sometimes 4. She denies SI/HI/AVH. She denies depression and anxiety but does admit to wanting to go home. Appetite good. Denies pain. She takes medications whole without issues.  Patient requested to speak with the MD. Shortly after the meeting, patient began experiencing panic. Patient had a frightened affect, worried facial expression, and arms rising in the air. Patient was moved near nurses station and support provided. Music played and active listening involved. Ativan 0.'5mg'$  adm at 1323. Upon follow up to the Ativan, patient states, "this is the best I've felt in awhile." Spiritual consult placed. A chaplain did visit and provided emotional care.  Patient remains on 1-1 sitter care.

## 2022-07-18 NOTE — BHH Group Notes (Signed)
Patient attended and engaged in group tonight

## 2022-07-18 NOTE — Progress Notes (Signed)
   07/18/22 1400  Spiritual Encounters  Type of Visit Initial  Care provided to: Patient  Conversation partners present during encounter Nurse  Referral source Patient request  Reason for visit Routine spiritual support  OnCall Visit No  Spiritual Framework  Presenting Themes Courage hope and growth;Rituals and practive;Community and relationships  Community/Connection Family  Patient Stress Factors Family relationships  Family Stress Factors Major life changes  Interventions  Spiritual Care Interventions Made Compassionate presence;Established relationship of care and support;Reflective listening;Normalization of emotions  Intervention Outcomes  Outcomes Connection to spiritual care;Reduced isolation;Connected to [place] Issues Still Outstanding No further spiritual care needs at this time (see row info)   Chap responded to a Consult to Spiritual and emotional care. Pt was requesting prayer for a Son who was undergoing serious life changes. Melven Sartorius offered a reflective listening as well as a intercessory prayer for Pt Son and the patients own recovery efforts. Pt very appreciative of the visit.

## 2022-07-18 NOTE — Progress Notes (Signed)
[facility] MD Progress Note  07/18/2022 10:21 AM Cathy [name]  MRN:  [id number] Subjective: Cathy [name] is seen on rounds.  Nurses informed me that she ate breakfast this morning.  She says that she did not sleep very well last night.  She has been compliant with medications and denies any side effects.  She is refusing her Prosource supplement so I will discontinue it.  Continue with Ensure.  Dr. Weber Cooks talked to her about ECT but she says that she does not want to do it.  Her mood is improved so I told her if she goes backward we will talk about it and she understands.  Social work continues to look for SNF but she has been turned down numerous times.  They do not come in and see her , they just go by her initial presentation. Principal Problem: Major depressive disorder, recurrent episode, severe (HCC) Diagnosis: Principal Problem:   Major depressive disorder, recurrent episode, severe (HCC) Active Problems:   Pelvic fracture (HCC)   Inferior pubic ramus fracture, left, closed, initial encounter ([place])  Total Time spent with patient: 15 minutes  Past Psychiatric History: Recurrent depression and anxiety.  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   Vision changes     Past Surgical History:  Procedure Laterality Date   CHOLECYSTECTOMY     IR IMAGING GUIDED PORT INSERTION  09/21/2020   LITHOTRIPSY  1984   LYMPH NODE BIOPSY     MASS BIOPSY Left 09/03/2020   Procedure:  LEFT NECK JUGULAR NODE;  Surgeon: Rozetta Nunnery, MD;  Location: [facility];  Service: ENT;  Laterality: Left;   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   Procedure: LEFT TOTAL KNEE ARTHROPLASTY;  Surgeon: Gaynelle Arabian, MD;  Location: [facility];  Service: Orthopedics;  Laterality: Left;   Family History:  Family History  Problem Relation Age of Onset   Hypertension Mother    Stroke Mother    Heart attack Mother        CABG, 5 Stents   Dementia Mother    Cancer Father        Kidney   Asthma Sister    Diabetes Brother        Borderline   Alcohol abuse Brother    Cancer Maternal Aunt        stomach ca   Cancer Maternal Aunt        ovarian ca   Breast cancer Cousin    Colon cancer Neg Hx    Family Psychiatric  History: Unremarkable Social History:  Social History   Substance and Sexual Activity  Alcohol Use No     Social History   Substance and Sexual Activity  Drug Use No    Social History   Socioeconomic History   Marital status: Widowed    Spouse name: Not on file  Number of children: Not on file   Years of education: Not on file   Highest education level: Not on file  Occupational History   Not on file  Tobacco Use   Smoking status: Never   Smokeless tobacco: Never  Substance and Sexual Activity   Alcohol use: No   Drug use: No   Sexual activity: Not Currently    Birth control/protection: Post-menopausal  Other Topics Concern   Not on file  Social History Narrative   Lives alone.     Son is [name].     Social Determinants of Health   Financial Resource Strain: Not on file  Food Insecurity: No Food Insecurity (06/14/2022)   Hunger Vital Sign    Worried About Running Out of Food in the Last Year: Never true    Ran Out of Food in the Last Year: Never true  Transportation Needs: No Transportation Needs (06/14/2022)   PRAPARE - Hydrologist (Medical): No    Lack of Transportation (Non-Medical): No   Physical Activity: Not on file  Stress: Not on file  Social Connections: Not on file   Additional Social History:                         Sleep: Fair  Appetite:  Fair  Current Medications: Current Facility-Administered Medications  Medication Dose Route Frequency Provider Last Rate Last Admin   (feeding supplement) PROSource Plus liquid 30 mL  30 mL Oral TID BM Parks Ranger, DO   30 mL at 07/17/22 0917   alum & mag hydroxide-simeth (MAALOX/MYLANTA) 200-200-20 MG/5ML suspension 30 mL  30 mL Oral Q4H PRN Parks Ranger, DO       aspirin EC tablet 81 mg  81 mg Oral Daily Parks Ranger, DO   81 mg at 07/18/22 0948   atorvastatin (LIPITOR) tablet 20 mg  20 mg Oral Daily Parks Ranger, DO   20 mg at 07/18/22 8850   cholecalciferol (VITAMIN D3) 25 MCG (1000 UNIT) tablet 1,000 Units  1,000 Units Oral Daily Parks Ranger, DO   1,000 Units at 07/18/22 2774   cyanocobalamin (VITAMIN B12) tablet 1,000 mcg  1,000 mcg Oral Daily Parks Ranger, DO   1,000 mcg at 07/18/22 1287   feeding supplement (ENSURE ENLIVE / ENSURE PLUS) liquid 237 mL  237 mL Oral TID BM Parks Ranger, DO   237 mL at 07/17/22 2126   haloperidol (HALDOL) tablet 1 mg  1 mg Oral QPC supper Parks Ranger, DO   1 mg at 07/17/22 1745   ibuprofen (ADVIL) tablet 400 mg  400 mg Oral Q6H PRN [name], [name], MD   400 mg at 07/14/22 1805   lip balm (BLISTEX) ointment   Topical PRN Parks Ranger, DO       LORazepam (ATIVAN) tablet 0.5 mg  0.5 mg Oral Q6H PRN Parks Ranger, DO   0.5 mg at 07/08/22 1432   magnesium hydroxide (MILK OF MAGNESIA) suspension 30 mL  30 mL Oral Daily PRN Parks Ranger, DO   30 mL at 06/24/22 1006   mirtazapine (REMERON) tablet 7.5 mg  7.5 mg Oral QHS Parks Ranger, DO   7.5 mg at 07/17/22 2126   modafinil (PROVIGIL) tablet 200 mg  200 mg Oral Daily Parks Ranger, DO   200 mg at  07/18/22 0948   multivitamin with minerals tablet 1 tablet  1 tablet Oral Daily Parks Ranger, DO   1 tablet at 07/18/22 0948   OLANZapine (ZYPREXA) tablet 5 mg  5 mg Oral QHS Parks Ranger, DO   5 mg at 07/17/22 2127   oxyCODONE-acetaminophen (PERCOCET) 7.5-325 MG per tablet 1 tablet  1 tablet Oral Q6H PRN [name], [name], MD   1 tablet at 07/15/22 2201   pantoprazole (PROTONIX) EC tablet 40 mg  40 mg Oral Daily Parks Ranger, DO   40 mg at 07/18/22 9470   polyethylene glycol (MIRALAX / GLYCOLAX) packet 17 g  17 g Oral Daily Parks Ranger, DO   17 g at 07/18/22 9628   traZODone (DESYREL) tablet 25 mg  25 mg Oral QHS PRN Parks Ranger, DO       venlafaxine XR (EFFEXOR-XR) 24 hr capsule 300 mg  300 mg Oral Q breakfast Parks Ranger, DO   300 mg at 07/18/22 3662    Lab Results: No results found for this or any previous visit (from the past 48 hour(s)).  Blood Alcohol level:  Lab Results  Component Value Date   ETH <10 94/76/5465    Metabolic Disorder Labs: Lab Results  Component Value Date   HGBA1C 5.5 06/10/2022   MPG 111.15 06/10/2022   MPG 105 04/05/2021   No results found for: "PROLACTIN" Lab Results  Component Value Date   CHOL 246 (H) 06/10/2022   TRIG 94 06/10/2022   HDL 57 06/10/2022   CHOLHDL 4.3 06/10/2022   VLDL 19 06/10/2022   LDLCALC 170 (H) 06/10/2022   LDLCALC 67 04/05/2021    Physical Findings: AIMS:  , ,  ,  ,    CIWA:    COWS:     Musculoskeletal: Strength & Muscle Tone: within normal limits Gait & Station: unsteady Patient leans: N/A  Psychiatric Specialty Exam:  Presentation  General Appearance:  Appropriate for Environment; Casual  Eye Contact: Good  Speech: Clear and Coherent; Normal Rate  Speech Volume: Normal  Handedness: Right   Mood and Affect  Mood: Dysphoric  Affect: Congruent   Thought Process  Thought Processes: Coherent  Descriptions of  Associations:Intact  Orientation:Full (Time, Place and Person)  Thought Content:Logical  History of Schizophrenia/Schizoaffective disorder:No  Duration of Psychotic Symptoms:No data recorded Hallucinations:No data recorded Ideas of Reference:None  Suicidal Thoughts:No data recorded Homicidal Thoughts:No data recorded  Sensorium  Memory: Immediate Good; Recent Good; Remote Good  Judgment: Good  Insight: Good   Executive Functions  Concentration: Good  Attention Span: Good  Recall: Good  Fund of Knowledge: Good  Language: Good   Psychomotor Activity  Psychomotor Activity:No data recorded  Assets  Assets: Physical Health; Resilience; Social Support; Catering manager; Housing   Sleep  Sleep:No data recorded   Physical Exam: Physical Exam Vitals and nursing note reviewed.  Constitutional:      Appearance: Normal appearance. She is normal weight.  Neurological:     General: No focal deficit present.     Mental Status: She is alert and oriented to person, place, and time.  Psychiatric:        Attention and Perception: Attention and perception normal.        Mood and Affect: Mood is depressed. Affect is flat.        Speech: Speech normal.        Behavior: Behavior normal. Behavior is cooperative.        Thought Content: Thought content normal.        Cognition  and Memory: Memory normal. Cognition is impaired.        Judgment: Judgment normal.    Review of Systems  Constitutional: Negative.   HENT: Negative.    Eyes: Negative.   Respiratory: Negative.    Cardiovascular: Negative.   Gastrointestinal: Negative.   Genitourinary: Negative.   Musculoskeletal: Negative.   Skin: Negative.   Neurological: Negative.   Endo/Heme/Allergies: Negative.   Psychiatric/Behavioral:  Positive for depression. The patient has insomnia.    Blood pressure 117/69, pulse 88, temperature 98.1 F (36.7 C), resp. rate 14, height '5\' 2"'$  (1.575 m), SpO2  98 %. Body mass index is 20.01 kg/m.   Treatment Plan Summary: Daily contact with patient to assess and evaluate symptoms and progress in treatment, Medication management, and Plan discontinue Prosource.  Continue current medications.  Continue physical therapy.  Parks Ranger, DO 07/18/2022, 10:21 AM

## 2022-07-18 NOTE — Plan of Care (Signed)
  Problem: Nutrition: Goal: Adequate nutrition will be maintained Outcome: Progressing   Problem: Coping: Goal: Level of anxiety will decrease Outcome: Progressing   Problem: Elimination: Goal: Will not experience complications related to bowel motility Outcome: Progressing Goal: Will not experience complications related to urinary retention Outcome: Progressing   

## 2022-07-18 NOTE — BH IP Treatment Plan (Signed)
Interdisciplinary Treatment and Diagnostic Plan Update  07/18/2022 Time of Session: 9:20AM SHERAY GRIST MRN: [id number]  Principal Diagnosis: Major depressive disorder, recurrent episode, severe ([place])  Secondary Diagnoses: Principal Problem:   Major depressive disorder, recurrent episode, severe ([place]) Active Problems:   Pelvic fracture ([place])   Inferior pubic ramus fracture, left, closed, initial encounter ([place])   Current Medications:  Current Facility-Administered Medications  Medication Dose Route Frequency Provider Last Rate Last Admin   (feeding supplement) PROSource Plus liquid 30 mL  30 mL Oral TID BM Parks Ranger, DO   30 mL at 07/17/22 0917   alum & mag hydroxide-simeth (MAALOX/MYLANTA) 200-200-20 MG/5ML suspension 30 mL  30 mL Oral Q4H PRN Parks Ranger, DO       aspirin EC tablet 81 mg  81 mg Oral Daily Parks Ranger, DO   81 mg at 07/17/22 0272   atorvastatin (LIPITOR) tablet 20 mg  20 mg Oral Daily Parks Ranger, DO   20 mg at 07/17/22 0915   cholecalciferol (VITAMIN D3) 25 MCG (1000 UNIT) tablet 1,000 Units  1,000 Units Oral Daily Parks Ranger, DO   1,000 Units at 07/17/22 5366   cyanocobalamin (VITAMIN B12) tablet 1,000 mcg  1,000 mcg Oral Daily Parks Ranger, DO   1,000 mcg at 07/17/22 4403   feeding supplement (ENSURE ENLIVE / ENSURE PLUS) liquid 237 mL  237 mL Oral TID BM Parks Ranger, DO   237 mL at 07/17/22 2126   haloperidol (HALDOL) tablet 1 mg  1 mg Oral QPC supper Parks Ranger, DO   1 mg at 07/17/22 1745   ibuprofen (ADVIL) tablet 400 mg  400 mg Oral Q6H PRN [name], [name], MD   400 mg at 07/14/22 1805   lip balm (BLISTEX) ointment   Topical PRN Parks Ranger, DO       LORazepam (ATIVAN) tablet 0.5 mg  0.5 mg Oral Q6H PRN Parks Ranger, DO   0.5 mg at 07/08/22 1432   magnesium hydroxide (MILK OF MAGNESIA) suspension 30 mL  30 mL Oral Daily PRN Parks Ranger, DO   30 mL at 06/24/22 1006   mirtazapine (REMERON) tablet 7.5 mg  7.5 mg Oral QHS Parks Ranger, DO   7.5 mg at 07/17/22 2126   modafinil (PROVIGIL) tablet 200 mg  200 mg Oral Daily Parks Ranger, DO   200 mg at 07/17/22 4742   multivitamin with minerals tablet 1 tablet  1 tablet Oral Daily Parks Ranger, DO   1 tablet at 07/17/22 0915   OLANZapine (ZYPREXA) tablet 5 mg  5 mg Oral QHS Parks Ranger, DO   5 mg at 07/17/22 2127   oxyCODONE-acetaminophen (PERCOCET) 7.5-325 MG per tablet 1 tablet  1 tablet Oral Q6H PRN [name], [name], MD   1 tablet at 07/15/22 2201   pantoprazole (PROTONIX) EC tablet 40 mg  40 mg Oral Daily Parks Ranger, DO   40 mg at 07/17/22 5956   polyethylene glycol (MIRALAX / GLYCOLAX) packet 17 g  17 g Oral Daily Parks Ranger, DO   17 g at 07/17/22 0915   traZODone (DESYREL) tablet 25 mg  25 mg Oral QHS PRN Parks Ranger, DO       venlafaxine XR (EFFEXOR-XR) 24 hr capsule 300 mg  300 mg Oral Q breakfast Parks Ranger, DO   300 mg at 07/17/22 0915   PTA Medications: Medications Prior to Admission  Medication Sig Dispense Refill Last Dose   albuterol (VENTOLIN HFA) 108 (90 Base) MCG/ACT inhaler Inhale 2 puffs into the lungs every 6 (six) hours as needed for wheezing or shortness of breath. 8 g 2    aspirin EC 81 MG tablet Take 81 mg by mouth daily.      atorvastatin (LIPITOR) 20 MG tablet Take 1 tablet (20 mg total) by mouth daily. 90 tablet 1    cetirizine (ZYRTEC) 10 MG tablet Take 1 tablet (10 mg total) by mouth daily. 90 tablet 1    cholecalciferol (CHOLECALCIFEROL) 25 MCG tablet Take 1 tablet (1,000 Units total) by mouth daily.      cyanocobalamin (VITAMIN B12) 1000 MCG tablet Take 1 tablet (1,000 mcg total) by mouth daily.      denosumab (PROLIA) 60 MG/ML SOSY injection Inject 60 mg into the skin every 6 (six) months.      estradiol (ESTRACE) 0.1 MG/GM vaginal cream Discard  plastic applicator. Insert blueberry size amount of cream on finger in vagina daily x1 week then 2x per week.      fluticasone furoate-vilanterol (BREO ELLIPTA) 100-25 MCG/ACT AEPB Inhale 1 puff into the lungs daily. (Patient taking differently: Inhale 1 puff into the lungs daily as needed.) 60 each 5    glucose blood (ACCU-CHEK AVIVA PLUS) test strip Use to Monitor blood sugars daily. DX. E11.9 100 each 5    hydrochlorothiazide (MICROZIDE) 12.5 MG capsule Take 1 capsule (12.5 mg total) by mouth daily. 90 capsule 1    hydroxypropyl methylcellulose / hypromellose (ISOPTO TEARS / GONIOVISC) 2.5 % ophthalmic solution Place 2 drops into both eyes 3 (three) times daily as needed for dry eyes.      lidocaine-prilocaine (EMLA) cream Apply to affected area once (Patient taking differently: 1 Application as needed. Apply to affected area to port as needed) 30 g 3    losartan (COZAAR) 100 MG tablet TAKE 1 TABLET BY MOUTH ONCE A DAY 90 tablet 1    metoprolol succinate (TOPROL-XL) 50 MG 24 hr tablet Take 1 tablet (50 mg total) by mouth daily. Take with or immediately following a meal. 90 tablet 1    Multiple Vitamin (MULTIVITAMIN) capsule Take 1 capsule by mouth daily.      omeprazole (PRILOSEC) 20 MG capsule Take 1 capsule (20 mg total) by mouth daily. 90 capsule 1    venlafaxine XR (EFFEXOR XR) 75 MG 24 hr capsule Take 2 capsules (150 mg total) by mouth daily with breakfast. (Patient taking differently: Take 75 mg by mouth daily with breakfast.)       Patient Stressors:    Patient Strengths:    Treatment Modalities: Medication Management, Group therapy, Case management,  1 to 1 session with clinician, Psychoeducation, Recreational therapy.   Physician Treatment Plan for Primary Diagnosis: Major depressive disorder, recurrent episode, severe ([place]) Long Term Goal(s): Improvement in symptoms so as ready for discharge   Short Term Goals: Ability to identify changes in lifestyle to reduce recurrence of  condition will improve Ability to verbalize feelings will improve Ability to disclose and discuss suicidal ideas Ability to demonstrate self-control will improve Ability to identify and develop effective coping behaviors will improve Ability to maintain clinical measurements within normal limits will improve Compliance with prescribed medications will improve Ability to identify triggers associated with substance abuse/mental health issues will improve  Medication Management: Evaluate patient's response, side effects, and tolerance of medication regimen.  Therapeutic Interventions: 1 to 1 sessions, Unit Group sessions and Medication administration.  Evaluation of Outcomes: Progressing  Physician Treatment Plan for Secondary Diagnosis: Principal Problem:   Major depressive disorder, recurrent episode, severe ([place]) Active Problems:   Pelvic fracture ([facility])   Inferior pubic ramus fracture, left, closed, initial encounter ([place])  Long Term Goal(s): Improvement in symptoms so as ready for discharge   Short Term Goals: Ability to identify changes in lifestyle to reduce recurrence of condition will improve Ability to verbalize feelings will improve Ability to disclose and discuss suicidal ideas Ability to demonstrate self-control will improve Ability to identify and develop effective coping behaviors will improve Ability to maintain clinical measurements within normal limits will improve Compliance with prescribed medications will improve Ability to identify triggers associated with substance abuse/mental health issues will improve     Medication Management: Evaluate patient's response, side effects, and tolerance of medication regimen.  Therapeutic Interventions: 1 to 1 sessions, Unit Group sessions and Medication administration.  Evaluation of Outcomes: Progressing   RN Treatment Plan for Primary Diagnosis: Major depressive disorder, recurrent episode, severe ([place]) Long Term Goal(s):  Knowledge of disease and therapeutic regimen to maintain health will improve  Short Term Goals: Ability to demonstrate self-control, Ability to participate in decision making will improve, Ability to verbalize feelings will improve, Ability to disclose and discuss suicidal ideas, Ability to identify and develop effective coping behaviors will improve, and Compliance with prescribed medications will improve  Medication Management: RN will administer medications as ordered by provider, will assess and evaluate patient's response and provide education to patient for prescribed medication. RN will report any adverse and/or side effects to prescribing provider.  Therapeutic Interventions: 1 on 1 counseling sessions, Psychoeducation, Medication administration, Evaluate responses to treatment, Monitor vital signs and CBGs as ordered, Perform/monitor CIWA, COWS, AIMS and Fall Risk screenings as ordered, Perform wound care treatments as ordered.  Evaluation of Outcomes: Progressing   LCSW Treatment Plan for Primary Diagnosis: Major depressive disorder, recurrent episode, severe ([place]) Long Term Goal(s): Safe transition to appropriate next level of care at discharge, Engage patient in therapeutic group addressing interpersonal concerns.  Short Term Goals: Engage patient in aftercare planning with referrals and resources, Increase social support, Increase ability to appropriately verbalize feelings, Increase emotional regulation, Facilitate acceptance of mental health diagnosis and concerns, and Increase skills for wellness and recovery  Therapeutic Interventions: Assess for all discharge needs, 1 to 1 time with Social worker, Explore available resources and support systems, Assess for adequacy in community support network, Educate family and significant other(s) on suicide prevention, Complete Psychosocial Assessment, Interpersonal group therapy.  Evaluation of Outcomes: Progressing   Progress in  Treatment: Attending groups: Yes. Participating in groups: Yes. Taking medication as prescribed: Yes. Toleration medication: Yes. Family/Significant other contact made: Yes, individual(s) contacted:  SPE completed with the patient's son.  Patient understands diagnosis: Yes. Discussing patient identified problems/goals with staff: Yes. Medical problems stabilized or resolved: Yes. Denies suicidal/homicidal ideation: Yes. Issues/concerns per patient self-inventory: No. Other: none  New problem(s) identified: No, Describe:  None   New Short Term/Long Term Goal(s): Patient to work towards elimination of symptoms of psychosis, medication management for mood stabilization; development of comprehensive mental wellness plan.  Update 07/11/2022:  No changes at this time.  Update 07/18/2022:  No changes at this time.    New Short Term/Long Term Goal(s): Patient to work towards medication management for mood stabilization; elimination of SI thoughts; development of comprehensive mental wellness plan. Update 06/30/22: No changes at this time. Update 07/05/22: No changes at this time  Update 07/11/2022:  No changes at this time.   Update 07/18/2022:  No changes at this time.    Patient Goals: No additional goals identified at this time. Patient to continue to work towards original goals identified in initial treatment team meeting. CSW will remain available to patient should they voice additional treatment goals. Update 06/30/22: No changes at this time. Update 07/05/22: No changes at this time Update 07/11/2022:  No changes at this time. Update 07/18/2022:  No changes at this time.    Discharge Plan or Barriers: No psychosocial barriers identified at this time, patient to return to place of residence when appropriate for discharge. Update 06/30/22: PT/OT recommended pt discharge to SNF, CSW spoke with pt's son who stated he was interested in taking pt to his home instead of SNF at discharge and finding care for pt from  there. Update 07/05/22: Patient had a fall. SNF recommended. Referral for SNF facilities sent, pt discharge pending placement and provider statement of readiness. Update 07/11/2022:  Patient has been recommended for SNF at this time.  CSW continues to look for appropriate placement. Should appropriate bed not be found CSW to assist with development of alternative plan.   Update 07/18/2022:  No updates at this time.  Patient remains on the unit waiting placement at SNF.  At this time the patient has been declined from several SNFs at this time.    Last 3 [place] Suicide Severity Risk Score: Flowsheet Row Admission (Current) from 06/14/2022 in [facility] ED from 06/10/2022 in [facility] Admission (Discharged) from 09/03/2020 in [facility] High Risk No Risk No Risk       Last PHQ 2/9 Scores:    08/10/2020    9:35 AM 01/13/2020    3:59 PM 07/16/2019    9:36 AM  Depression screen PHQ 2/9  Decreased Interest 0 0 0  Down, Depressed, Hopeless 0 0 0  PHQ - 2 Score 0 0 0    Scribe for Treatment Team: [name], LCSW 07/18/2022 9:20 AM

## 2022-07-19 DIAGNOSIS — F332 Major depressive disorder, recurrent severe without psychotic features: Secondary | ICD-10-CM | POA: Diagnosis not present

## 2022-07-19 NOTE — Progress Notes (Signed)
1:1 Note  0715 - Patient is sleeping. Sitter at side.   0815 - Patient is sleeping. Sitter at side.   0915 - Patient is awake in the dayroom. Sitter at side. Attempting to eat breakfast.  1015 - Patient is in the dayroom. Sitter at side.  1115 - Patient is sleeping. Sitter at side. She was unable to awaken for breakfast.  1215 - Patient is sleeping. Sitter at side.   1315 - Patient is sleeping. Sitter at side. No distress noted.  1415 - Patient is sleeping. Sitter at side.   1515 - Patient is awake and worked with PT .  1615 - Patient is sleeping. Sitter at side. She is unable to be awaken for dinner. Vitals WDL.  1715 - Patient is sleeping. Sitter at side.   1815 - Patient is sleeping. Sitter at side. Patient does not appear to be in any pain or distress.  Patient was in a lethargic state for the majority of the day. 1800 Haldol held. Patient did attend and was active in morning group. She also had an interview with [employer] today. See CSW note at 1104.

## 2022-07-19 NOTE — Progress Notes (Signed)
[facility] MD Progress Note  07/19/2022 9:53 AM Cathy [name]  MRN:  [id number] Subjective: Cathy [name] is seen on rounds.  She has been sleeping this morning but generally doing well at night except for continues to have fixed delusions that her son has sold her house or that I am keeping her in jail.  She has been compliant with medications and no side effects.  Her appetite has mildly improved.  Overall, her mood seems to be improved at times but does worsen in the evening.  That is when she gets more delusional and paranoid.  She has been more interactive with staff and peers and smiling.  Social work continues to look for skilled nursing facility which is what she needs.  She has been seen to respond insular stimuli at times. 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   Vision changes     Past Surgical History:  Procedure Laterality Date   CHOLECYSTECTOMY     IR IMAGING GUIDED PORT INSERTION  09/21/2020    LITHOTRIPSY  1984   LYMPH NODE BIOPSY     MASS BIOPSY Left 09/03/2020   Procedure: LEFT NECK JUGULAR NODE;  Surgeon: Rozetta Nunnery, MD;  Location: [facility];  Service: ENT;  Laterality: Left;   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   Procedure: LEFT TOTAL KNEE ARTHROPLASTY;  Surgeon: Gaynelle Arabian, MD;  Location: [facility];  Service: Orthopedics;  Laterality: Left;   Family History:  Family History  Problem Relation Age of Onset   Hypertension Mother    Stroke Mother    Heart attack Mother        CABG, 5 Stents   Dementia Mother    Cancer Father        [facility]   Asthma Sister    Diabetes Brother        Borderline   Alcohol abuse Brother    Cancer Maternal Aunt        stomach ca   Cancer Maternal Aunt        ovarian ca   Breast cancer Cousin    Colon cancer Neg Hx    Family Psychiatric  History: Unremarkable Social History:  Social History   Substance and Sexual Activity  Alcohol Use No     Social History   Substance and Sexual Activity  Drug Use No    Social History   Socioeconomic History   Marital status: Widowed    Spouse name: Not on file  Number of children: Not on file   Years of education: Not on file   Highest education level: Not on file  Occupational History   Not on file  Tobacco Use   Smoking status: Never   Smokeless tobacco: Never  Substance and Sexual Activity   Alcohol use: No   Drug use: No   Sexual activity: Not Currently    Birth control/protection: Post-menopausal  Other Topics Concern   Not on file  Social History Narrative   Lives alone.     Son is [name].     Social Determinants of Health   Financial Resource Strain: Not on file  Food Insecurity: No Food Insecurity (06/14/2022)   Hunger Vital Sign    Worried About Running Out of Food in the Last Year: Never true    Ran Out of Food in the Last Year: Never true  Transportation Needs: No Transportation Needs (06/14/2022)   PRAPARE - Armed forces logistics/support/administrative officer (Medical): No    Lack of Transportation (Non-Medical): No  Physical Activity: Not on file  Stress: Not on file  Social Connections: Not on file   Additional Social History:                         Sleep: Good  Appetite:  Fair  Current Medications: Current Facility-Administered Medications  Medication Dose Route Frequency Provider Last Rate Last Admin   alum & mag hydroxide-simeth (MAALOX/MYLANTA) 200-200-20 MG/5ML suspension 30 mL  30 mL Oral Q4H PRN Parks Ranger, DO       aspirin EC tablet 81 mg  81 mg Oral Daily Parks Ranger, DO   81 mg at 07/18/22 0948   atorvastatin (LIPITOR) tablet 20 mg  20 mg Oral Daily Parks Ranger, DO   20 mg at 07/18/22 0017   cholecalciferol (VITAMIN D3) 25 MCG (1000 UNIT) tablet 1,000 Units  1,000 Units Oral Daily Parks Ranger, DO   1,000 Units at 07/18/22 4944   cyanocobalamin (VITAMIN B12) tablet 1,000 mcg  1,000 mcg Oral Daily Parks Ranger, DO   1,000 mcg at 07/18/22 9675   feeding supplement (ENSURE ENLIVE / ENSURE PLUS) liquid 237 mL  237 mL Oral TID BM Parks Ranger, DO   237 mL at 07/18/22 2042   haloperidol (HALDOL) tablet 1 mg  1 mg Oral QPC supper Parks Ranger, DO   1 mg at 07/18/22 1718   ibuprofen (ADVIL) tablet 400 mg  400 mg Oral Q6H PRN [name], [name], MD   400 mg at 07/14/22 1805   lip balm (BLISTEX) ointment   Topical PRN Parks Ranger, DO       LORazepam (ATIVAN) tablet 0.5 mg  0.5 mg Oral Q6H PRN Parks Ranger, DO   0.5 mg at 07/19/22 0109   magnesium hydroxide (MILK OF MAGNESIA) suspension 30 mL  30 mL Oral Daily PRN Parks Ranger, DO   30 mL at 06/24/22 1006   mirtazapine (REMERON) tablet 7.5 mg  7.5 mg Oral QHS Parks Ranger, DO   7.5 mg at 07/18/22 2104   modafinil (PROVIGIL) tablet 200 mg  200 mg Oral Daily Parks Ranger, DO   200 mg at 07/18/22 9163   multivitamin with  minerals tablet 1 tablet  1 tablet Oral Daily Parks Ranger, DO   1 tablet at 07/18/22 0948   OLANZapine (ZYPREXA) tablet 5 mg  5 mg  Oral QHS Parks Ranger, DO   5 mg at 07/18/22 2105   oxyCODONE-acetaminophen (PERCOCET) 7.5-325 MG per tablet 1 tablet  1 tablet Oral Q6H PRN [name], [name], MD   1 tablet at 07/19/22 0008   pantoprazole (PROTONIX) EC tablet 40 mg  40 mg Oral Daily Parks Ranger, DO   40 mg at 07/18/22 3151   polyethylene glycol (MIRALAX / GLYCOLAX) packet 17 g  17 g Oral Daily Parks Ranger, DO   17 g at 07/18/22 7616   traZODone (DESYREL) tablet 25 mg  25 mg Oral QHS PRN Parks Ranger, DO       venlafaxine XR (EFFEXOR-XR) 24 hr capsule 300 mg  300 mg Oral Q breakfast Parks Ranger, DO   300 mg at 07/18/22 0737    Lab Results: No results found for this or any previous visit (from the past 48 hour(s)).  Blood Alcohol level:  Lab Results  Component Value Date   ETH <10 10/62/6948    Metabolic Disorder Labs: Lab Results  Component Value Date   HGBA1C 5.5 06/10/2022   MPG 111.15 06/10/2022   MPG 105 04/05/2021   No results found for: "PROLACTIN" Lab Results  Component Value Date   CHOL 246 (H) 06/10/2022   TRIG 94 06/10/2022   HDL 57 06/10/2022   CHOLHDL 4.3 06/10/2022   VLDL 19 06/10/2022   LDLCALC 170 (H) 06/10/2022   LDLCALC 67 04/05/2021    Physical Findings: AIMS:  , ,  ,  ,    CIWA:    COWS:     Musculoskeletal: Strength & Muscle Tone: within normal limits Gait & Station: unsteady Patient leans: N/A  Psychiatric Specialty Exam:  Presentation  General Appearance:  Appropriate for Environment; Casual  Eye Contact: Good  Speech: Clear and Coherent; Normal Rate  Speech Volume: Normal  Handedness: Right   Mood and Affect  Mood: Dysphoric  Affect: Congruent   Thought Process  Thought Processes: Coherent  Descriptions of Associations:Intact  Orientation:Full (Time,  Place and Person)  Thought Content:Logical  History of Schizophrenia/Schizoaffective disorder:No  Duration of Psychotic Symptoms:No data recorded Hallucinations:No data recorded Ideas of Reference:None  Suicidal Thoughts:No data recorded Homicidal Thoughts:No data recorded  Sensorium  Memory: Immediate Good; Recent Good; Remote Good  Judgment: Good  Insight: Good   Executive Functions  Concentration: Good  Attention Span: Good  Recall: Good  Fund of Knowledge: Good  Language: Good   Psychomotor Activity  Psychomotor Activity:No data recorded  Assets  Assets: Physical Health; Resilience; Social Support; Catering manager; Housing   Sleep  Sleep:No data recorded   Physical Exam: Physical Exam Vitals and nursing note reviewed.  Constitutional:      Appearance: Normal appearance. She is normal weight.  Neurological:     General: No focal deficit present.     Mental Status: She is alert and oriented to person, place, and time.  Psychiatric:        Attention and Perception: Attention normal.        Mood and Affect: Mood normal.        Speech: Speech normal.        Behavior: Behavior normal. Behavior is cooperative.        Thought Content: Thought content is paranoid and delusional.        Cognition and Memory: Cognition is impaired. Memory is impaired.        Judgment: Judgment is inappropriate.    Review of Systems  Constitutional: Negative.  HENT: Negative.    Eyes: Negative.   Respiratory: Negative.    Cardiovascular: Negative.   Gastrointestinal: Negative.   Genitourinary: Negative.   Musculoskeletal: Negative.   Skin: Negative.   Neurological: Negative.   Endo/Heme/Allergies: Negative.   Psychiatric/Behavioral:  Positive for depression. The patient is nervous/anxious.    Blood pressure (!) 118/46, pulse 79, temperature 98.6 F (37 C), resp. rate 16, height '5\' 2"'$  (1.575 m), SpO2 99 %. Body mass index is 20.01  kg/m.   Treatment Plan Summary: Daily contact with patient to assess and evaluate symptoms and progress in treatment, Medication management, and Plan continue current medications.  Parks Ranger, DO 07/19/2022, 9:53 AM

## 2022-07-19 NOTE — [facility] Counselor (Signed)
CSW assisted patient with a meeting with [place] at [facility].   Kenney Houseman reports that she is interested in the patient but would need to staff patient with the nurses at her facility.  She reports that she would like to re-interview patient when she is more awake and aware and off 1:1.  Situation is ongoing.  Assunta Curtis, MSW, LCSW 07/19/2022 11:07 AM

## 2022-07-19 NOTE — BHH Group Notes (Signed)
PsychoEducational Group Note- Patients were given poem by Denice Paradise Camacho-psychotherapist to read, titled '' the owl and the chimpanzee ''  Poem uses techniques of cognitive behavioral therapy to identify ways in which we react with fear and anxiety,versus staying calm and using judgement to help situations.  Patient attended and shared '' I was afraid of everything happening to me before I came here. I was afraid I would lose my house and my freedom. ''  Pt was very drowsy and could not keep eyes open during group but did participate.

## 2022-07-19 NOTE — BHH Counselor (Addendum)
CSW relayed message to doctor during morning progression.  Assunta Curtis, MSW, LCSW 07/19/2022 3:14 PM   CSW spoke with the patient's son, Dr. Dennard Schaumann.  He reports that he would like a phone call from the physician.  He reports that the family has received a letter from the patient's insurance company indicating that the patient's stay has been denied/not covered due to coding.   CSW updated patient's son on upcoming interview with [employer].  Son expressed understanding and stated no concerns.   CSW followed up on reports from patient that she can return to her home with her sister assisting her with her daily activities.  Son reports that this is a delusion of the patient.  This is not a viable option at this time.   Assunta Curtis, MSW, LCSW 07/19/2022 9:14 AM

## 2022-07-19 NOTE — Progress Notes (Signed)
Physical Therapy Treatment Patient Details Name: Cathy [name] MRN: [id number] DOB: May 24, 1946 Today's Date: 07/19/2022   History of Present Illness Pt is a 77 y.o. female presenting "voluntarily to [facility] reporting worsening depressive symptoms and suicidal ideation" 06/10/22; transferred to [facility] 06/14/22.  Unwitnessed fall 06/18/22 hitting head. Pt now with fall 07/01/22 and diagnosed with left inferior superior pubic ramus fracture and is WBAT per ortho consult note on 1/20.   PMH includes htn, vision changes, L TKA, lymphoma, depression, anxiety.    PT Comments    Pt able to walk 200, 130' with RW and min guard for self selected gait distances.  Overall improved balance with gait today with only min guard and no interventions needed for imbalances with gait.  She does continue to need +1 for safety.  She does need mod vc's for hand placements for sit to stand transfers.  Min a x 1 and cues for imbalances with transition.  Participated in exercises as described below.  Overall tolerated session well today.  Continues to need +1 assist for mobility for pt safety due to balance and safety deficits.   Recommendations for follow up therapy are one component of a multi-disciplinary discharge planning process, led by the attending physician.  Recommendations may be updated based on patient status, additional functional criteria and insurance authorization.  Follow Up Recommendations  Skilled nursing-short term rehab (<3 hours/day) Can patient physically be transported by private vehicle: Yes   Assistance Recommended at Discharge Frequent or constant Supervision/Assistance  Patient can return home with the following A little help with walking and/or transfers;A little help with bathing/dressing/bathroom;Assistance with cooking/housework;Direct supervision/assist for medications management;Direct supervision/assist for financial management;Assist for transportation;Help with stairs or ramp for  entrance   Equipment Recommendations  Rolling walker (2 wheels)    Recommendations for Other Services       Precautions / Restrictions Precautions Precautions: Fall Restrictions Weight Bearing Restrictions: Yes LLE Weight Bearing: Weight bearing as tolerated     Mobility  Bed Mobility               General bed mobility comments: NT, pt in recliner    Transfers Overall transfer level: Needs assistance Equipment used: Rolling walker (2 wheels), None Transfers: Sit to/from Stand Sit to Stand: Supervision, Min assist           General transfer comment: cues for hand placements.  does well with proper placements but unsteady if she tries to stand without rails or pulling on walker.    Ambulation/Gait Ambulation/Gait assistance: Min assist Gait Distance (Feet): 200 Feet Assistive device: Rolling walker (2 wheels) Gait Pattern/deviations: Step-through pattern, Antalgic, Decreased stance time - left, Decreased step length - right Gait velocity: decreased     General Gait Details: 200' 130'   Stairs             Wheelchair Mobility    Modified Rankin (Stroke Patients Only)       Balance Overall balance assessment: Needs assistance Sitting-balance support: No upper extremity supported, Feet supported Sitting balance-Leahy Scale: Normal     Standing balance support: During functional activity, No upper extremity supported, Bilateral upper extremity supported Standing balance-Leahy Scale: Fair Standing balance comment: improved today with less interventions for balance needed.                            Cognition Arousal/Alertness: Awake/alert Behavior During Therapy: WFL for tasks assessed/performed, Flat affect Overall Cognitive Status: Within  Functional Limits for tasks assessed                                 General Comments: Pt is alert and cooperative. Presents with flat affect but appropriate responds to  questions        Exercises Other Exercises Other Exercises: standing AROM for heel raises, SLR, marches and mini-squats.  5 x sit to stand with emphasis on hand placements    General Comments        Pertinent Vitals/Pain Pain Assessment Pain Assessment: No/denies pain    Home Living                          Prior Function            PT Goals (current goals can now be found in the care plan section) Progress towards PT goals: Progressing toward goals    Frequency    Min 2X/week      PT Plan Current plan remains appropriate    Co-evaluation              AM-PAC PT "6 Clicks" Mobility   Outcome Measure  Help needed turning from your back to your side while in a flat bed without using bedrails?: A Little Help needed moving from lying on your back to sitting on the side of a flat bed without using bedrails?: A Little Help needed moving to and from a bed to a chair (including a wheelchair)?: A Little Help needed standing up from a chair using your arms (e.g., wheelchair or bedside chair)?: A Little Help needed to walk in hospital room?: A Little Help needed climbing 3-5 steps with a railing? : A Little 6 Click Score: 18    End of Session Equipment Utilized During Treatment: Gait belt Activity Tolerance: Patient tolerated treatment well Patient left: in chair;with nursing/sitter in room Nurse Communication: Mobility status PT Visit Diagnosis: Unsteadiness on feet (R26.81);History of falling (Z91.81);Other abnormalities of gait and mobility (R26.89);Muscle weakness (generalized) (M62.81);Pain Pain - Right/Left: Left Pain - part of body: Hip     Time: 4656-8127 PT Time Calculation (min) (ACUTE ONLY): 14 min  Charges:  $Gait Training: 8-22 mins                    Chesley Noon, PTA 07/19/22, 3:37 PM

## 2022-07-19 NOTE — Plan of Care (Signed)
  Problem: Pain Managment: Goal: General experience of comfort will improve Outcome: Progressing   Problem: Safety: Goal: Ability to remain free from injury will improve Outcome: Progressing   Problem: Coping: Goal: Level of anxiety will decrease Outcome: Not Progressing

## 2022-07-19 NOTE — BHH Group Notes (Signed)
Community meeting/Goals group  Pt attended. She participated in daily stretches and shares that '' I am feeling just sleepy and tired today '' denies any SI or HI or concerns for MD. Pt very sleepy and drowsy in group, unable to keep eyes open very long.

## 2022-07-19 NOTE — Group Note (Signed)
LCSW Group Therapy Note  Group Date: 07/19/2022 Start Time: 1330 End Time: 1345   Type of Therapy and Topic:  Group Therapy - How To Cope with Nervousness about Discharge   Participation Level:  Did Not Attend   Description of Group This process group involved identification of patients' feelings about discharge. Some of them are scheduled to be discharged soon, while others are new admissions, but each of them was asked to share thoughts and feelings surrounding discharge from the hospital. One common theme was that they are excited at the prospect of going home, while another was that many of them are apprehensive about sharing why they were hospitalized. Patients were given the opportunity to discuss these feelings with their peers in preparation for discharge.  Therapeutic Goals  Patient will identify their overall feelings about pending discharge. Patient will think about how they might proactively address issues that they believe will once again arise once they get home (i.e. with parents). Patients will participate in discussion about having hope for change.   Summary of Patient Progress:   CSW attempted to invite patient to group.  Patient was asleep.  CSW did not wake patient.    Therapeutic Modalities Cognitive Behavioral Therapy   Maryjane Hurter 07/19/2022  2:03 PM

## 2022-07-20 DIAGNOSIS — F332 Major depressive disorder, recurrent severe without psychotic features: Secondary | ICD-10-CM | POA: Diagnosis not present

## 2022-07-20 LAB — COMPREHENSIVE METABOLIC PANEL
ALT: 19 U/L (ref 0–44)
AST: 21 U/L (ref 15–41)
Albumin: 3.4 g/dL — ABNORMAL LOW (ref 3.5–5.0)
Alkaline Phosphatase: 123 U/L (ref 38–126)
Anion gap: 7 (ref 5–15)
BUN: 14 mg/dL (ref 8–23)
CO2: 24 mmol/L (ref 22–32)
Calcium: 8 mg/dL — ABNORMAL LOW (ref 8.9–10.3)
Chloride: 108 mmol/L (ref 98–111)
Creatinine, Ser: 0.55 mg/dL (ref 0.44–1.00)
GFR, Estimated: >60 mL/min (ref >60)
Glucose, Bld: 96 mg/dL (ref 70–99)
Potassium: 4.4 mmol/L (ref 3.5–5.1)
Sodium: 139 mmol/L (ref 135–145)
Total Bilirubin: 0.7 mg/dL (ref 0.3–1.2)
Total Protein: 6.4 g/dL — ABNORMAL LOW (ref 6.5–8.1)

## 2022-07-20 LAB — CBC WITH DIFFERENTIAL/PLATELET
Abs Immature Granulocytes: 0.03 10*3/uL (ref 0.00–0.07)
Basophils Absolute: 0.1 10*3/uL (ref 0.0–0.1)
Basophils Relative: 1 %
Eosinophils Absolute: 0.2 10*3/uL (ref 0.0–0.5)
Eosinophils Relative: 2 %
HCT: 38.3 % (ref 36.0–46.0)
Hemoglobin: 12.1 g/dL (ref 12.0–15.0)
Immature Granulocytes: 0 %
Lymphocytes Relative: 14 %
Lymphs Abs: 1.2 10*3/uL (ref 0.7–4.0)
MCH: 29.5 pg (ref 26.0–34.0)
MCHC: 31.6 g/dL (ref 30.0–36.0)
MCV: 93.4 fL (ref 80.0–100.0)
Monocytes Absolute: 0.7 10*3/uL (ref 0.1–1.0)
Monocytes Relative: 8 %
Neutro Abs: 6.7 10*3/uL (ref 1.7–7.7)
Neutrophils Relative %: 75 %
Platelets: 365 10*3/uL (ref 150–400)
RBC: 4.1 MIL/uL (ref 3.87–5.11)
RDW: 13.8 % (ref 11.5–15.5)
WBC: 8.9 10*3/uL (ref 4.0–10.5)
nRBC: 0 % (ref 0.0–0.2)

## 2022-07-20 LAB — URINALYSIS, ROUTINE W REFLEX MICROSCOPIC
Bilirubin Urine: NEGATIVE
Glucose, UA: NEGATIVE mg/dL
Hgb urine dipstick: NEGATIVE
Ketones, ur: 5 mg/dL — AB
Leukocytes,Ua: NEGATIVE
Nitrite: NEGATIVE
Protein, ur: NEGATIVE mg/dL
Specific Gravity, Urine: 1.019 (ref 1.005–1.030)
pH: 6 (ref 5.0–8.0)

## 2022-07-20 NOTE — Progress Notes (Signed)
Patient denies SI, HI, and AVH. She presents with a flat affect and interacts minimally with others. Patient asks to go back to her room at 8:30pm, after snack and group. Patient is compliant with scheduled medications. She remains safe on the unit at this time.

## 2022-07-20 NOTE — Progress Notes (Signed)
   07/20/22 1300  Spiritual Encounters  Type of Visit Follow up  Care provided to: Patient  Conversation partners present during encounter Nurse  Referral source Chaplain assessment  Reason for visit Routine spiritual support  OnCall Visit No  Spiritual Framework  Presenting Themes Values and beliefs;Meaning/purpose/sources of inspiration;Courage hope and growth;Rituals and practive  Community/Connection Family;Faith community  Patient Stress Factors Family relationships  Interventions  Spiritual Care Interventions Made Compassionate presence;Established relationship of care and support;Prayer  Intervention Outcomes  Outcomes Connected to spiritual community;Reduced isolation;Awareness around self/spiritual resourses  Spiritual Care Plan  Spiritual Care Issues Still Outstanding No further spiritual care needs at this time (see row info)   Chap visited patient while on routine rounding. Patient voiced a concern about her only son, she requested a prayer for him noting that he is a very spiritual person. Chap listened attentively as the patient reflected on her life journey. Pt and nurses where pleased for the visit.

## 2022-07-20 NOTE — BHH Group Notes (Signed)
Patient attended and engaged in group discussion

## 2022-07-20 NOTE — Plan of Care (Signed)
Pt denies anxiety/depression at this time. Pt denies SI/HI/AVH or pain at this time. Pt is calm and cooperative. Pt is medication compliant. Pt provided with support and encouragement. Pt monitored q15 minutes for safety per unit policy. Plan of care ongoing.   Pt has walked on unit with use of gait belt, walker, and presence of this RN. Pt ate all of her cereal, some of her omelet, and drank all of her ensure for breakfast. Pt also ate well for lunch and drank most of her afternoon ensure.   Pt spoke with the chaplin today, has been pleasant, calm, cooperative, alert, and oriented. Pt has not had any incontinent episodes.   Problem: Nutrition: Goal: Adequate nutrition will be maintained Outcome: Progressing   Problem: Coping: Goal: Level of anxiety will decrease Outcome: Progressing

## 2022-07-20 NOTE — Progress Notes (Signed)
[facility] MD Progress Note  07/20/2022 1:00 PM Cathy [name]  MRN:  [id number] Subjective: Cathy [name] is seen on rounds.  She has been eating better today.  She is more alert today.  She seems to be in a better mood.  Nurses informed me that she ate breakfast and she also ate lunch.  She slept through the night.  She still delusional about certain subjects.  Still waiting placement.  Physical therapy continues to work with her.  She received Ativan and Percocet yesterday and I think it carried over for a long period of time which made her more sedated. Principal Problem: Major depressive disorder, recurrent episode, severe with mood-congruent psychotic features ([place]) Diagnosis: Principal Problem:   Major depressive disorder, recurrent episode, severe with mood-congruent psychotic features ([place]) Active Problems:   Pelvic fracture ([place])   Inferior pubic ramus fracture, left, closed, initial encounter ([place])  Total Time spent with patient: 15 minutes  Past Psychiatric History: Recurrent depression and anxiety.  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   Vision changes     Past Surgical History:  Procedure Laterality Date   CHOLECYSTECTOMY     IR IMAGING GUIDED PORT INSERTION  09/21/2020   LITHOTRIPSY  1984   LYMPH NODE BIOPSY     MASS BIOPSY Left 09/03/2020   Procedure: LEFT NECK JUGULAR NODE;  Surgeon: Rozetta Nunnery, MD;  Location: [facility];   Service: ENT;  Laterality: Left;   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   Procedure: LEFT TOTAL KNEE ARTHROPLASTY;  Surgeon: Gaynelle Arabian, MD;  Location: [facility];  Service: Orthopedics;  Laterality: Left;   Family History:  Family History  Problem Relation Age of Onset   Hypertension Mother    Stroke Mother    Heart attack Mother        CABG, 5 Stents   Dementia Mother    Cancer Father        Kidney   Asthma Sister    Diabetes Brother        Borderline   Alcohol abuse Brother    Cancer Maternal Aunt        stomach ca   Cancer Maternal Aunt        ovarian ca   Breast cancer Cousin    Colon cancer Neg Hx    Family Psychiatric  History: Unremarkable Social History:  Social History   Substance and Sexual Activity  Alcohol Use No     Social History   Substance and Sexual Activity  Drug Use No    Social History   Socioeconomic History   Marital status: Widowed    Spouse name: Not on file   Number of children: Not on file   Years of education: Not on file   Highest education level: Not on file  Occupational History   Not on file  Tobacco Use   Smoking status: Never   Smokeless tobacco: Never  Substance and Sexual Activity   Alcohol use: No   Drug use: No   Sexual activity: Not Currently    Birth control/protection: Post-menopausal  Other Topics Concern   Not on file  Social History Narrative   Lives alone.     Son is [name].     Social Determinants of Health   Financial Resource Strain: Not on file  Food Insecurity: No Food Insecurity (06/14/2022)   Hunger Vital Sign    Worried About Running Out of Food in the Last Year: Never true    Ran Out of Food in the Last Year: Never true  Transportation Needs: No Transportation Needs (06/14/2022)   PRAPARE - Hydrologist (Medical): No    Lack of Transportation (Non-Medical): No  Physical Activity: Not on file  Stress: Not on file  Social Connections: Not on file    Additional Social History:                         Sleep: Good  Appetite:  Fair  Current Medications: Current Facility-Administered Medications  Medication Dose Route Frequency Provider Last Rate Last Admin   alum & mag hydroxide-simeth (MAALOX/MYLANTA) 200-200-20 MG/5ML suspension 30 mL  30 mL Oral Q4H PRN Parks Ranger, DO       aspirin EC tablet 81 mg  81 mg Oral Daily Parks Ranger, DO   81 mg at 07/20/22 0911   atorvastatin (LIPITOR) tablet 20 mg  20 mg Oral Daily Parks Ranger, DO   20 mg at 07/20/22 0912   cholecalciferol (VITAMIN D3) 25 MCG (1000 UNIT) tablet 1,000 Units  1,000 Units Oral Daily Parks Ranger, DO   1,000 Units at 07/20/22 0912   cyanocobalamin (VITAMIN B12) tablet 1,000 mcg  1,000 mcg Oral Daily Parks Ranger, DO   1,000 mcg at 07/20/22 0911   feeding supplement (ENSURE ENLIVE / ENSURE PLUS) liquid 237 mL  237 mL Oral TID BM Parks Ranger, DO   237 mL at 07/20/22 0912   haloperidol (HALDOL) tablet 1 mg  1 mg Oral QPC supper Parks Ranger, DO   1 mg at 07/18/22 1718   ibuprofen (ADVIL) tablet 400 mg  400 mg Oral Q6H PRN [name], [name], MD   400 mg at 07/14/22 1805   lip balm (BLISTEX) ointment   Topical PRN Parks Ranger, DO       LORazepam (ATIVAN) tablet 0.5 mg  0.5 mg Oral Q6H PRN Parks Ranger, DO   0.5 mg at 07/19/22 0109   magnesium hydroxide (MILK OF MAGNESIA) suspension 30 mL  30 mL Oral Daily PRN Parks Ranger, DO   30 mL at 06/24/22 1006   mirtazapine (REMERON) tablet 7.5 mg  7.5 mg Oral QHS Parks Ranger, DO   7.5 mg at 07/18/22 2104   modafinil (PROVIGIL) tablet 200 mg  200 mg Oral Daily Parks Ranger, DO   200 mg at 07/20/22 3419   multivitamin with minerals tablet 1 tablet  1 tablet Oral Daily Parks Ranger, DO   1 tablet at 07/20/22 0912   OLANZapine (ZYPREXA) tablet 5 mg  5 mg Oral QHS Parks Ranger,  DO   5 mg at 07/18/22 2105   oxyCODONE-acetaminophen (PERCOCET) 7.5-325 MG per tablet 1 tablet  1 tablet Oral Q6H PRN [name], John  T, MD   1 tablet at 07/19/22 0008   pantoprazole (PROTONIX) EC tablet 40 mg  40 mg Oral Daily Parks Ranger, DO   40 mg at 07/20/22 0911   polyethylene glycol (MIRALAX / GLYCOLAX) packet 17 g  17 g Oral Daily Parks Ranger, DO   17 g at 07/20/22 4235   traZODone (DESYREL) tablet 25 mg  25 mg Oral QHS PRN Parks Ranger, DO       venlafaxine XR (EFFEXOR-XR) 24 hr capsule 300 mg  300 mg Oral Q breakfast Parks Ranger, DO   300 mg at 07/20/22 3614    Lab Results:  Results for orders placed or performed during the hospital encounter of 06/14/22 (from the past 48 hour(s))  CBC with Differential/Platelet     Status: None   Collection Time: 07/20/22  8:34 AM  Result Value Ref Range   WBC 8.9 4.0 - 10.5 K/uL   RBC 4.10 3.87 - 5.11 MIL/uL   Hemoglobin 12.1 12.0 - 15.0 g/dL   HCT 38.3 36.0 - 46.0 %   MCV 93.4 80.0 - 100.0 fL   MCH 29.5 26.0 - 34.0 pg   MCHC 31.6 30.0 - 36.0 g/dL   RDW 13.8 11.5 - 15.5 %   Platelets 365 150 - 400 K/uL   nRBC 0.0 0.0 - 0.2 %   Neutrophils Relative % 75 %   Neutro Abs 6.7 1.7 - 7.7 K/uL   Lymphocytes Relative 14 %   Lymphs Abs 1.2 0.7 - 4.0 K/uL   Monocytes Relative 8 %   Monocytes Absolute 0.7 0.1 - 1.0 K/uL   Eosinophils Relative 2 %   Eosinophils Absolute 0.2 0.0 - 0.5 K/uL   Basophils Relative 1 %   Basophils Absolute 0.1 0.0 - 0.1 K/uL   Immature Granulocytes 0 %   Abs Immature Granulocytes 0.03 0.00 - 0.07 K/uL    Comment: Performed at [facility], [address] [place], [place] [address]  Comprehensive metabolic panel     Status: Abnormal   Collection Time: 07/20/22  8:34 AM  Result Value Ref Range   Sodium 139 135 - 145 mmol/L   Potassium 4.4 3.5 - 5.1 mmol/L   Chloride 108 98 - 111 mmol/L   CO2 24 22 - 32 mmol/L   Glucose, Bld 96 70 - 99 mg/dL    Comment: Glucose  reference range applies only to samples taken after fasting for at least 8 hours.   BUN 14 8 - 23 mg/dL   Creatinine, Ser 0.55 0.44 - 1.00 mg/dL   Calcium 8.0 (L) 8.9 - 10.3 mg/dL   Total Protein 6.4 (L) 6.5 - 8.1 g/dL   Albumin 3.4 (L) 3.5 - 5.0 g/dL   AST 21 15 - 41 U/L   ALT 19 0 - 44 U/L   Alkaline Phosphatase 123 38 - 126 U/L   Total Bilirubin 0.7 0.3 - 1.2 mg/dL   GFR, Estimated >60 >60 mL/min    Comment: (NOTE) Calculated using the CKD-EPI Creatinine Equation (2021)    Anion gap 7 5 - 15    Comment: Performed at [facility], [address] [place], [place] [address]    Blood Alcohol level:  Lab Results  Component Value Date   [facility] <10 61/95/0932    Metabolic Disorder Labs: Lab Results  Component Value Date   HGBA1C 5.5 06/10/2022   MPG 111.15 06/10/2022   MPG 105 04/05/2021   No results found for: "PROLACTIN"  Lab Results  Component Value Date   CHOL 246 (H) 06/10/2022   TRIG 94 06/10/2022   HDL 57 06/10/2022   CHOLHDL 4.3 06/10/2022   VLDL 19 06/10/2022   LDLCALC 170 (H) 06/10/2022   LDLCALC 67 04/05/2021    Physical Findings: AIMS:  , ,  ,  ,    CIWA:    COWS:     Musculoskeletal: Strength & Muscle Tone: within normal limits Gait & Station: unsteady, unable to stand Patient leans: N/A  Psychiatric Specialty Exam:  Presentation  General Appearance:  Appropriate for Environment; Casual  Eye Contact: Good  Speech: Clear and Coherent; Normal Rate  Speech Volume: Normal  Handedness: Right   Mood and Affect  Mood: Dysphoric  Affect: Congruent   Thought Process  Thought Processes: Coherent  Descriptions of Associations:Intact  Orientation:Full (Time, Place and Person)  Thought Content:Logical  History of Schizophrenia/Schizoaffective disorder:No  Duration of Psychotic Symptoms:No data recorded Hallucinations:No data recorded Ideas of Reference:None  Suicidal Thoughts:No data recorded Homicidal Thoughts:No  data recorded  Sensorium  Memory: Immediate Good; Recent Good; Remote Good  Judgment: Good  Insight: Good   Executive Functions  Concentration: Good  Attention Span: Good  Recall: Good  Fund of Knowledge: Good  Language: Good   Psychomotor Activity  Psychomotor Activity:No data recorded  Assets  Assets: Physical Health; Resilience; Social Support; Catering manager; Housing   Sleep  Sleep:No data recorded   Physical Exam: Physical Exam Vitals and nursing note reviewed.  Constitutional:      Appearance: Normal appearance. She is normal weight.  Neurological:     General: No focal deficit present.     Mental Status: She is alert and oriented to person, place, and time.  Psychiatric:        Attention and Perception: Attention and perception normal.        Mood and Affect: Mood is depressed. Affect is flat.        Speech: Speech normal.        Behavior: Behavior normal. Behavior is cooperative.        Thought Content: Thought content is paranoid and delusional.        Cognition and Memory: Memory normal. Cognition is impaired.        Judgment: Judgment is inappropriate.    Review of Systems  Constitutional: Negative.   HENT: Negative.    Eyes: Negative.   Respiratory: Negative.    Cardiovascular: Negative.   Gastrointestinal: Negative.   Genitourinary: Negative.   Musculoskeletal: Negative.   Skin: Negative.   Neurological: Negative.   Endo/Heme/Allergies: Negative.   Psychiatric/Behavioral:  Positive for depression.    Blood pressure 124/69, pulse 85, temperature 98 F (36.7 C), temperature source Oral, resp. rate 16, height '5\' 2"'$  (1.575 m), SpO2 99 %. Body mass index is 20.01 kg/m.   Treatment Plan Summary: Daily contact with patient to assess and evaluate symptoms and progress in treatment, Medication management, and Plan continue current medications.  [place], DO 07/20/2022, 1:00 PM

## 2022-07-20 NOTE — Group Note (Signed)
LCSW Group Therapy Note  Group Date: 07/20/2022 Start Time: 1305 End Time: 1315   Type of Therapy and Topic:  Group Therapy - Healthy vs Unhealthy Coping Skills  Participation Level:  Did Not Attend   Description of Group The focus of this group was to determine what unhealthy coping techniques typically are used by group members and what healthy coping techniques would be helpful in coping with various problems. Patients were guided in becoming aware of the differences between healthy and unhealthy coping techniques. Patients were asked to identify 2-3 healthy coping skills they would like to learn to use more effectively.  Therapeutic Goals Patients learned that coping is what human beings do all day long to deal with various situations in their lives Patients defined and discussed healthy vs unhealthy coping techniques Patients identified their preferred coping techniques and identified whether these were healthy or unhealthy Patients determined 2-3 healthy coping skills they would like to become more familiar with and use more often. Patients provided support and ideas to each other   Summary of Patient Progress:    X  Therapeutic Modalities Cognitive Behavioral Therapy Motivational Interviewing  [place] A [place], [place] 07/20/2022  1:19 PM

## 2022-07-20 NOTE — Progress Notes (Signed)
Patient appeared lethargic. Evening medications held. Sitter at the bed side for safety. Will continue to monitor and update as needed.

## 2022-07-21 DIAGNOSIS — F332 Major depressive disorder, recurrent severe without psychotic features: Secondary | ICD-10-CM | POA: Diagnosis not present

## 2022-07-21 NOTE — Progress Notes (Signed)
Patient is alert and oriented times 3. Mood and affect sad/sullen. Patient c/o pain to left pelvis but declines pain medication. Heat pack in place. She denies SI, HI, and AVH. Also denies feelings of anxiety and depression at this time. States she slept good last night. Morning meds given whole by mouth W/O difficulty. Patient consumed breakfast in the dayroom- appetite good. Patient remains on unit with Q15 minute checks in place

## 2022-07-21 NOTE — Progress Notes (Signed)
Physical Therapy Treatment Patient Details Name: Cathy [name] MRN: [id number] DOB: 1945/10/07 Today's Date: 07/21/2022   History of Present Illness Pt is a 77 y.o. female presenting "voluntarily to [facility] reporting worsening depressive symptoms and suicidal ideation" 06/10/22; transferred to [facility] 06/14/22.  Unwitnessed fall 06/18/22 hitting head. Pt now with fall 07/01/22 and diagnosed with left inferior superior pubic ramus fracture and is WBAT per ortho consult note on 1/20.   PMH includes htn, vision changes, L TKA, lymphoma, depression, anxiety.    PT Comments    Pt was pleasant and motivated to participate during the session and put forth good effort throughout. Berg Balance Test performed with pt scoring 32/56 indicating a high risk for falls.  Pt required assistance to prevent falls on multiple occasions during the test.  Pt did demonstrate grossly improved stability with transfers and gait with the RW this session along with increased stance time on her LLE with gait.  Pt will benefit from PT services in a SNF setting upon discharge to safely address deficits listed in patient problem list for decreased caregiver assistance and eventual return to PLOF.     Recommendations for follow up therapy are one component of a multi-disciplinary discharge planning process, led by the attending physician.  Recommendations may be updated based on patient status, additional functional criteria and insurance authorization.  Follow Up Recommendations  Skilled nursing-short term rehab (<3 hours/day) Can patient physically be transported by private vehicle: Yes   Assistance Recommended at Discharge Frequent or constant Supervision/Assistance  Patient can return home with the following A little help with walking and/or transfers;A little help with bathing/dressing/bathroom;Assistance with cooking/housework;Direct supervision/assist for medications management;Direct supervision/assist for financial  management;Assist for transportation;Help with stairs or ramp for entrance   Equipment Recommendations  Rolling walker (2 wheels)    Recommendations for Other Services       Precautions / Restrictions Precautions Precautions: Fall Restrictions Weight Bearing Restrictions: Yes LLE Weight Bearing: Weight bearing as tolerated     Mobility  Bed Mobility               General bed mobility comments: NT, pt in recliner    Transfers Overall transfer level: Needs assistance Equipment used: Rolling walker (2 wheels) Transfers: Sit to/from Stand Sit to Stand: Supervision           General transfer comment: Min increased effort with transfers but steady with no LOB this session    Ambulation/Gait Ambulation/Gait assistance: Min guard Gait Distance (Feet): 150 Feet x 2 Assistive device: Rolling walker (2 wheels) Gait Pattern/deviations: Step-through pattern, Antalgic, Decreased stance time - left, Decreased step length - right Gait velocity: decreased     General Gait Details: Mildly antalgic on the LLE with decreased LLE stance time but no LOB   Stairs             Wheelchair Mobility    Modified Rankin (Stroke Patients Only)       Balance Overall balance assessment: Needs assistance   Sitting balance-Leahy Scale: Normal     Standing balance support: During functional activity, No upper extremity supported, Bilateral upper extremity supported, Reliant on assistive device for balance Standing balance-Leahy Scale: Poor                   Standardized Balance Assessment Standardized Balance Assessment : Berg Balance Test Berg Balance Test Sit to Stand: Able to stand  independently using hands Standing Unsupported: Able to stand safely 2 minutes Sitting with Back  Unsupported but Feet Supported on Floor or Stool: Able to sit safely and securely 2 minutes Stand to Sit: Controls descent by using hands Transfers: Able to transfer safely, minor use  of hands Standing Unsupported with Eyes Closed: Able to stand 10 seconds with supervision Standing Ubsupported with Feet Together: Able to place feet together independently and stand for 1 minute with supervision From Standing, Reach Forward with Outstretched Arm: Can reach forward >12 cm safely (5") From Standing Position, Pick up Object from Floor: Able to pick up shoe, needs supervision From Standing Position, Turn to Look Behind Over each Shoulder: Turn sideways only but maintains balance Turn 360 Degrees: Needs assistance while turning Standing Unsupported, Alternately Place Feet on Step/Stool: Needs assistance to keep from falling or unable to try Standing Unsupported, One Foot in Front: Loses balance while stepping or standing Standing on One Leg: Unable to try or needs assist to prevent fall Total Score: 32        Cognition Arousal/Alertness: Awake/alert Behavior During Therapy: WFL for tasks assessed/performed, Flat affect Overall Cognitive Status: Within Functional Limits for tasks assessed                                          Exercises Other Exercises Other Exercises: Dynamic unsupported standing balance training with feet apart, together, and semi-tandem with reaching outside BOS Other Exercises: Static unsupported standing balance training with feet apart, together, and semi-tandem with combinations of eyes open/closed and head still/head turns    General Comments        Pertinent Vitals/Pain Pain Assessment Pain Assessment: 0-10 Pain Score: 4  Pain Location: L hip Pain Descriptors / Indicators: Sore Pain Intervention(s): Premedicated before session, Monitored during session    Home Living                          Prior Function            PT Goals (current goals can now be found in the care plan section) Progress towards PT goals: Progressing toward goals    Frequency    Min 2X/week      PT Plan Current plan remains  appropriate    Co-evaluation              AM-PAC PT "6 Clicks" Mobility   Outcome Measure  Help needed turning from your back to your side while in a flat bed without using bedrails?: A Little Help needed moving from lying on your back to sitting on the side of a flat bed without using bedrails?: A Little Help needed moving to and from a bed to a chair (including a wheelchair)?: A Little Help needed standing up from a chair using your arms (e.g., wheelchair or bedside chair)?: A Little Help needed to walk in hospital room?: A Little Help needed climbing 3-5 steps with a railing? : A Little 6 Click Score: 18    End of Session Equipment Utilized During Treatment: Gait belt Activity Tolerance: Patient tolerated treatment well Patient left: in chair;with nursing/sitter in room Nurse Communication: Mobility status PT Visit Diagnosis: Unsteadiness on feet (R26.81);History of falling (Z91.81);Other abnormalities of gait and mobility (R26.89);Muscle weakness (generalized) (M62.81);Pain Pain - Right/Left: Left Pain - part of body: Hip     Time: 1638-4536 PT Time Calculation (min) (ACUTE ONLY): 24 min  Charges:  $Gait Training: 8-22 mins $  Therapeutic Exercise: 8-22 mins                    D. Royetta Asal PT, DPT 07/21/22, 10:42 AM

## 2022-07-21 NOTE — [facility] Group Notes (Signed)
[facility] Group Notes:  (Nursing/MHT/Case Management/Adjunct)  Date:  07/21/2022  Time:  6:21 PM  Type of Therapy:   Music Group  Participation Level:  Active  Participation Quality:  Appropriate  Affect:  Appropriate  Cognitive:  Appropriate  Insight:  Appropriate  Engagement in Group:  Engaged  Modes of Intervention:  Activity  Summary of Progress/Problems:  Cathy [name] [facility] 07/21/2022, 6:21 PM

## 2022-07-21 NOTE — Group Note (Signed)
LCSW Group Therapy Note  Group Date: 07/21/2022 Start Time: 3614 End Time: 1400   Type of Therapy and Topic:  Group Therapy - How To Cope with Nervousness about Discharge   Participation Level:  Active   Description of Group This process group involved identification of patients' feelings about discharge. Some of them are scheduled to be discharged soon, while others are new admissions, but each of them was asked to share thoughts and feelings surrounding discharge from the hospital. One common theme was that they are excited at the prospect of going home, while another was that many of them are apprehensive about sharing why they were hospitalized. Patients were given the opportunity to discuss these feelings with their peers in preparation for discharge.  Therapeutic Goals  Patient will identify their overall feelings about pending discharge. Patient will think about how they might proactively address issues that they believe will once again arise once they get home (i.e. with parents). Patients will participate in discussion about having hope for change.   Summary of Patient Progress:   She was very active throughout the session. She demonstrated fair insight into the subject matter and feedback from [place]. She was respectful of peers and participated throughout the entire session. She said was smiling but her affect was low. She said "I just want her to go home." She said she felt that her mental health diagnosis was preventing SNF placement. CSW explained that there are significant factors contributing and it is not about her mental health diagnosis. She said was feeling defeated and looked forward to going soon.    Therapeutic Modalities Cognitive Behavioral Therapy   Czar Ysaguirre A [place], LCSWA 07/21/2022  2:08 PM

## 2022-07-21 NOTE — Progress Notes (Signed)
   07/21/22 0747  Psych Admission Type (Psych Patients Only)  Admission Status Voluntary  Psychosocial Assessment  Patient Complaints None  Eye Contact Fair  Facial Expression Sad  Affect Depressed;Sad  Speech Logical/coherent  Interaction Minimal  Motor Activity Slow  Appearance/Hygiene Unremarkable  Behavior Characteristics Cooperative  Mood Sullen;Pleasant  Thought Process  Coherency WDL  Content WDL  Delusions None reported or observed  Perception WDL  Hallucination None reported or observed  Judgment WDL  Confusion WDL  Danger to Self  Current suicidal ideation? Denies  Danger to Others  Danger to Others None reported or observed

## 2022-07-21 NOTE — Progress Notes (Signed)
[facility] MD Progress Note  07/21/2022 1:57 PM Cathy [name]  MRN:  [id number] Subjective: Cathy [name] is seen on rounds.  She ate breakfast and lunch.  She has been compliant with medications and no side effects.  She continues to be paranoid and delusional especially when she discusses her home.  She is more alert today.  She has not had any pain medications.  Social work continues to work for [facility] facility.  She has been pleasant and cooperative.  Labs came back within normal limits including urinalysis.  She has ketones in her urine and a low protein which coincides with her lack of appetite. Principal Problem: Major depressive disorder, recurrent episode, severe with mood-congruent psychotic features ([place]) Diagnosis: Principal Problem:   Major depressive disorder, recurrent episode, severe with mood-congruent psychotic features ([place]) Active Problems:   Pelvic fracture ([place])   Inferior pubic ramus fracture, left, closed, initial encounter ([place])  Total Time spent with patient: 15 minutes  Past Psychiatric History: Recurrent depression and anxiety.  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   Vision changes     Past Surgical History:  Procedure Laterality Date   CHOLECYSTECTOMY     IR IMAGING GUIDED PORT INSERTION  09/21/2020   LITHOTRIPSY  1984   LYMPH NODE BIOPSY     MASS BIOPSY Left 09/03/2020   Procedure: LEFT NECK JUGULAR NODE;  Surgeon: Rozetta Nunnery, MD;  Location: [facility];  Service: ENT;  Laterality: Left;   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   Procedure: LEFT TOTAL KNEE ARTHROPLASTY;  Surgeon: Gaynelle Arabian, MD;  Location: [facility];  Service: Orthopedics;  Laterality: Left;   Family History:  Family History  Problem Relation Age of Onset   Hypertension Mother    Stroke Mother    Heart attack Mother        CABG, 5 Stents   Dementia Mother    Cancer Father        Kidney   Asthma Sister    Diabetes Brother        Borderline   Alcohol abuse Brother    Cancer Maternal Aunt        stomach ca   Cancer Maternal Aunt        ovarian ca   Breast cancer Cousin    Colon cancer Neg Hx    Family Psychiatric  History: Unremarkable Social History:  Social History   Substance and Sexual Activity  Alcohol Use No     Social History   Substance and Sexual Activity  Drug Use No    Social History   Socioeconomic History   Marital status: Widowed    Spouse name: Not on file   Number of children: Not on file   Years of education: Not on file   Highest education level: Not on file  Occupational History  Not on file  Tobacco Use   Smoking status: Never   Smokeless tobacco: Never  Substance and Sexual Activity   Alcohol use: No   Drug use: No   Sexual activity: Not Currently    Birth control/protection: Post-menopausal  Other Topics Concern   Not on file  Social History Narrative   Lives alone.     Son is [name].     Social Determinants of Health   Financial Resource Strain: Not on file  Food Insecurity: No Food Insecurity (06/14/2022)   Hunger Vital Sign    Worried About Running Out of Food in the Last Year: Never true    Ran Out of Food in the Last Year: Never true  Transportation Needs: No Transportation Needs (06/14/2022)   PRAPARE - Hydrologist (Medical): No    Lack of Transportation (Non-Medical): No  Physical Activity: Not on file  Stress:  Not on file  Social Connections: Not on file   Additional Social History:                         Sleep: Good  Appetite:  Fair  Current Medications: Current Facility-Administered Medications  Medication Dose Route Frequency Provider Last Rate Last Admin   alum & mag hydroxide-simeth (MAALOX/MYLANTA) 200-200-20 MG/5ML suspension 30 mL  30 mL Oral Q4H PRN Parks Ranger, DO       aspirin EC tablet 81 mg  81 mg Oral Daily Parks Ranger, DO   81 mg at 07/21/22 6045   atorvastatin (LIPITOR) tablet 20 mg  20 mg Oral Daily Parks Ranger, DO   20 mg at 07/21/22 4098   cholecalciferol (VITAMIN D3) 25 MCG (1000 UNIT) tablet 1,000 Units  1,000 Units Oral Daily Parks Ranger, DO   1,000 Units at 07/21/22 1191   cyanocobalamin (VITAMIN B12) tablet 1,000 mcg  1,000 mcg Oral Daily Parks Ranger, DO   1,000 mcg at 07/21/22 4782   feeding supplement (ENSURE ENLIVE / ENSURE PLUS) liquid 237 mL  237 mL Oral TID BM Parks Ranger, DO   237 mL at 07/21/22 1052   haloperidol (HALDOL) tablet 1 mg  1 mg Oral QPC supper Parks Ranger, DO   1 mg at 07/20/22 1704   ibuprofen (ADVIL) tablet 400 mg  400 mg Oral Q6H PRN [name], [name], MD   400 mg at 07/14/22 1805   lip balm (BLISTEX) ointment   Topical PRN Parks Ranger, DO       LORazepam (ATIVAN) tablet 0.5 mg  0.5 mg Oral Q6H PRN Parks Ranger, DO   0.5 mg at 07/19/22 0109   magnesium hydroxide (MILK OF MAGNESIA) suspension 30 mL  30 mL Oral Daily PRN Parks Ranger, DO   30 mL at 06/24/22 1006   mirtazapine (REMERON) tablet 7.5 mg  7.5 mg Oral QHS Parks Ranger, DO   7.5 mg at 07/20/22 2113   modafinil (PROVIGIL) tablet 200 mg  200 mg Oral Daily Parks Ranger, DO   200 mg at 07/21/22 9562   multivitamin with minerals tablet 1 tablet  1 tablet Oral Daily Parks Ranger, DO   1 tablet at 07/21/22 0923   OLANZapine (ZYPREXA) tablet  5 mg  5 mg Oral QHS Parks Ranger, DO   5 mg at 07/20/22 2113   oxyCODONE-acetaminophen (PERCOCET) 7.5-325 MG per tablet 1 tablet  1 tablet Oral  Q6H PRN [name], [name], MD   1 tablet at 07/19/22 0008   pantoprazole (PROTONIX) EC tablet 40 mg  40 mg Oral Daily Parks Ranger, DO   40 mg at 07/21/22 0263   polyethylene glycol (MIRALAX / GLYCOLAX) packet 17 g  17 g Oral Daily Parks Ranger, DO   17 g at 07/21/22 7858   traZODone (DESYREL) tablet 25 mg  25 mg Oral QHS PRN Parks Ranger, DO       venlafaxine XR (EFFEXOR-XR) 24 hr capsule 300 mg  300 mg Oral Q breakfast Parks Ranger, DO   300 mg at 07/21/22 8502    Lab Results:  Results for orders placed or performed during the hospital encounter of 06/14/22 (from the past 48 hour(s))  CBC with Differential/Platelet     Status: None   Collection Time: 07/20/22  8:34 AM  Result Value Ref Range   WBC 8.9 4.0 - 10.5 K/uL   RBC 4.10 3.87 - 5.11 MIL/uL   Hemoglobin 12.1 12.0 - 15.0 g/dL   HCT 38.3 36.0 - 46.0 %   MCV 93.4 80.0 - 100.0 fL   MCH 29.5 26.0 - 34.0 pg   MCHC 31.6 30.0 - 36.0 g/dL   RDW 13.8 11.5 - 15.5 %   Platelets 365 150 - 400 K/uL   nRBC 0.0 0.0 - 0.2 %   Neutrophils Relative % 75 %   Neutro Abs 6.7 1.7 - 7.7 K/uL   Lymphocytes Relative 14 %   Lymphs Abs 1.2 0.7 - 4.0 K/uL   Monocytes Relative 8 %   Monocytes Absolute 0.7 0.1 - 1.0 K/uL   Eosinophils Relative 2 %   Eosinophils Absolute 0.2 0.0 - 0.5 K/uL   Basophils Relative 1 %   Basophils Absolute 0.1 0.0 - 0.1 K/uL   Immature Granulocytes 0 %   Abs Immature Granulocytes 0.03 0.00 - 0.07 K/uL    Comment: Performed at [facility], [address] [place], [place] [address]  Comprehensive metabolic panel     Status: Abnormal   Collection Time: 07/20/22  8:34 AM  Result Value Ref Range   Sodium 139 135 - 145 mmol/L   Potassium 4.4 3.5 - 5.1 mmol/L   Chloride 108 98 - 111 mmol/L   CO2 24 22 - 32 mmol/L    Glucose, Bld 96 70 - 99 mg/dL    Comment: Glucose reference range applies only to samples taken after fasting for at least 8 hours.   BUN 14 8 - 23 mg/dL   Creatinine, Ser 0.55 0.44 - 1.00 mg/dL   Calcium 8.0 (L) 8.9 - 10.3 mg/dL   Total Protein 6.4 (L) 6.5 - 8.1 g/dL   Albumin 3.4 (L) 3.5 - 5.0 g/dL   AST 21 15 - 41 U/L   ALT 19 0 - 44 U/L   Alkaline Phosphatase 123 38 - 126 U/L   Total Bilirubin 0.7 0.3 - 1.2 mg/dL   GFR, Estimated >60 >60 mL/min    Comment: (NOTE) Calculated using the CKD-EPI Creatinine Equation (2021)    Anion gap 7 5 - 15    Comment: Performed at [facility], [address] [place], [place] [address]  Urinalysis, Routine w reflex microscopic -Urine, Clean Catch     Status: Abnormal   Collection Time: 07/20/22 12:30 PM  Result Value Ref Range   Color, Urine YELLOW (A) YELLOW   APPearance HAZY (A) CLEAR   Specific Gravity, Urine 1.019 1.005 -  1.030   pH 6.0 5.0 - 8.0   Glucose, UA NEGATIVE NEGATIVE mg/dL   Hgb urine dipstick NEGATIVE NEGATIVE   Bilirubin Urine NEGATIVE NEGATIVE   Ketones, ur 5 (A) NEGATIVE mg/dL   Protein, ur NEGATIVE NEGATIVE mg/dL   Nitrite NEGATIVE NEGATIVE   Leukocytes,Ua NEGATIVE NEGATIVE    Comment: Performed at [facility], [address] [place], [place] [address]    Blood Alcohol level:  Lab Results  Component Value Date   [facility] <10 73/22/0254    Metabolic Disorder Labs: Lab Results  Component Value Date   HGBA1C 5.5 06/10/2022   MPG 111.15 06/10/2022   MPG 105 04/05/2021   No results found for: "PROLACTIN" Lab Results  Component Value Date   CHOL 246 (H) 06/10/2022   TRIG 94 06/10/2022   HDL 57 06/10/2022   CHOLHDL 4.3 06/10/2022   VLDL 19 06/10/2022   LDLCALC 170 (H) 06/10/2022   LDLCALC 67 04/05/2021    Physical Findings: AIMS:  , ,  ,  ,    CIWA:    COWS:     Musculoskeletal: Strength & Muscle Tone: within normal limits Gait & Station: unsteady, unable to stand Patient leans:  N/A  Psychiatric Specialty Exam:  Presentation  General Appearance:  Appropriate for Environment; Casual  Eye Contact: Good  Speech: Clear and Coherent; Normal Rate  Speech Volume: Normal  Handedness: Right   Mood and Affect  Mood: Dysphoric  Affect: Congruent   Thought Process  Thought Processes: Coherent  Descriptions of Associations:Intact  Orientation:Full (Time, Place and Person)  Thought Content:Logical  History of Schizophrenia/Schizoaffective disorder:No  Duration of Psychotic Symptoms:No data recorded Hallucinations:No data recorded Ideas of Reference:None  Suicidal Thoughts:No data recorded Homicidal Thoughts:No data recorded  Sensorium  Memory: Immediate Good; Recent Good; Remote Good  Judgment: Good  Insight: Good   Executive Functions  Concentration: Good  Attention Span: Good  Recall: Good  Fund of Knowledge: Good  Language: Good   Psychomotor Activity  Psychomotor Activity:No data recorded  Assets  Assets: Physical Health; Resilience; Social Support; Catering manager; Housing   Sleep  Sleep:No data recorded   Physical Exam: Physical Exam Vitals and nursing note reviewed.  Constitutional:      Appearance: Normal appearance. She is normal weight.  Neurological:     General: No focal deficit present.     Mental Status: She is alert and oriented to person, place, and time.  Psychiatric:        Attention and Perception: Attention and perception normal.        Mood and Affect: Mood is depressed. Affect is flat.        Speech: Speech normal.        Behavior: Behavior normal. Behavior is cooperative.        Thought Content: Thought content is delusional.        Cognition and Memory: Memory normal. Cognition is impaired.        Judgment: Judgment normal.    Review of Systems  Constitutional: Negative.   HENT: Negative.    Eyes: Negative.   Respiratory: Negative.    Cardiovascular:  Negative.   Gastrointestinal: Negative.   Genitourinary: Negative.   Musculoskeletal: Negative.   Skin: Negative.   Neurological: Negative.   Endo/Heme/Allergies: Negative.   Psychiatric/Behavioral:  Positive for depression.    Blood pressure 118/67, pulse 89, temperature 97.8 F (36.6 C), temperature source Oral, resp. rate 16, height '5\' 2"'$  (1.575 m), SpO2 100 %. Body mass index is  20.01 kg/m.   Treatment Plan Summary: Daily contact with patient to assess and evaluate symptoms and progress in treatment, Medication management, and Plan continue current medications.  Parks Ranger, DO 07/21/2022, 1:57 PM

## 2022-07-21 NOTE — BH Assessment (Signed)
1905 Received patient sitting In the dayroom watching TV with other patients. She is alert one on one staffing is within arms length for safety.  63 Patient's daughter is here visiting and she appears to be enjoying the visit.  2030 Patient  is alert and oriented x 3 continues to be medication compliant.had her snack and has remained safe. She is medication compliant with medications. She is currently denying SI/HI, A/V hallucination, depression, anxiety and pain.   0030 Patient is rested quietly in bed with one on one at bedside. No unsafe behavior noted thus far.   0430 Patient continue to rest quietly in bed  with one on one at bedside.  0630 Patient have remained safe with one on one at the bedside.

## 2022-07-21 NOTE — BHH Counselor (Signed)
CSW called the following to follow up on referral status for patient:   CSW will continue to follow up with facilities until discharge plan has been finalized.    [facility] SNF/ALF  Pending - Request Sent [address] [place] [address] [phone] [place] Tim Lair at [phone], currently out of office until Monday. Contacted [phone] per VM instructions, no answer, unable to leave VM. Contacted Midwife at [phone]. Stated beds available typically for current residents only. Provided social worker Butch Penny, [phone] contact to reach out regarding referral.   CSW left VM with Butch Penny regarding referral.    [facility]  Pending - Request Sent [address] [place] [address] [phone] Forwarded to [place] in admissions, left VM with contact information.   [facility]erred [facility]  Pending - Request Sent [address] [employer], [place] [address] [phone] HIPAA compliant voicemail left.   [facility] Preferred SNF  Pending - Request Sent [address], [place] [address] [phone] Line rang with no pickup or option to leave a message  [facility] [place] [address], [place] [address] [phone] Patient's insurance out of network.     Jesusita Jocelyn Martinique, MSW, [place] 2/8/20244:15 PM      Destination  Service Provider Request Status Address Phone Fax Responses  [facility] Preferred SNF  Considering Still reviewing [address], [place] [address] [phone] Patient remains under review.  Requested that H&P be sent as well.  CSW has sent that document.  [facility] Preferred SNF  Pending - Request Sent [address] [place] [address] [phone] Line rang  with no pickup or option to leave a message  [facility] [place] [address], [place] [address] [phone] Number listed is for [place] not SNF.  Transferred to extension 100.  HIPAA compliant voicemail left.  [facility]  Pending - Request Sent [address], [place] [address] [phone] Same as below.  [facility]  Pending - Request Sent [phone], [place] [address] [phone] CSW left HIPAA compliant with Margarita Grizzle.  Voicemail indicates that current beds [facility].  [facility] RETIREMENT COMMUNITY SNF/ALF  Pending - Request Sent [address] [place] [address] [phone] Transferred to several different people as the number listed in EPIC is incorrect.  Transferred to [place], who is not in on Fridays.  Left HIPAA compliant voicemail.  Voicemail indicates that can reach [place] at [phone]  or Nurse Manager at [phone].  CSW also left message with Tim Lair.  [facility]erred [facility]  Pending - Request Sent [address], [place] [address] [phone] Number not in service.  [facility]  Pending - Request Sent [address], [place] [address] [phone] CSW left HIPAA compliant voicemail.   [facility] SN[facility]  Pending - Request Sent [address], [place] [address] [phone] Line rang with no pickup or option to leave a message  [facility]  Pending - Request Sent [address] [employer], [place] [address] [phone] HIPAA compliant voicemail left.  [facility]  Pending -  Request Sent [place], [place]  [address] [phone] [place] not a SNF

## 2022-07-21 NOTE — BHH Counselor (Signed)
CSW contacted [place] at [facility], [phone]. She was not available. CSW spoke with Sharyn Lull, another staff member at the facility.   She stated that unfortunately, pt does not have any beds available at this time, due to their corporate office capping their admissions.   She stated she would follow up with CSW if bed availability becomes available in the future.  She stated she would inform Kenney Houseman that CSW called regarding pt regarding placement.   Mathius Birkeland Martinique, MSW, LCSW-A 2/8/202411:00 AM

## 2022-07-22 DIAGNOSIS — F332 Major depressive disorder, recurrent severe without psychotic features: Secondary | ICD-10-CM | POA: Diagnosis not present

## 2022-07-22 NOTE — Group Note (Signed)
[facility] LCSW Group Therapy Note   Group Date: 07/22/2022 Start Time: 1400 End Time: 1415   Type of Therapy/Topic:  Group Therapy:  Emotion Regulation  Participation Level:  Did Not Attend   Mood:  Description of Group:    The purpose of this group is to assist patients in learning to regulate negative emotions and experience positive emotions. Patients will be guided to discuss ways in which they have been vulnerable to their negative emotions. These vulnerabilities will be juxtaposed with experiences of positive emotions or situations, and patients challenged to use positive emotions to combat negative ones. Special emphasis will be placed on coping with negative emotions in conflict situations, and patients will process healthy conflict resolution skills.  Therapeutic Goals: Patient will identify two positive emotions or experiences to reflect on in order to balance out negative emotions:  Patient will label two or more emotions that they find the most difficult to experience:  Patient will be able to demonstrate positive conflict resolution skills through discussion or role plays:   Summary of Patient Progress:   X    Therapeutic Modalities:   Cognitive Behavioral Therapy Feelings Identification Dialectical Behavioral Therapy   Cathy [name] A Martinique, LCSWA

## 2022-07-22 NOTE — Progress Notes (Signed)
[facility] MD Progress Note  07/22/2022 10:51 AM Cathy [name]  MRN:  [id number] Subjective: Cathy [name] is seen on rounds.  She has been pleasant and cooperative.  She does not seem to be as paranoid today.  She ate breakfast.  Nurses tell me that she has been eating better.  She slept through the night.  Compliant with medications and no side effects.  She is still depressed but smiles at times. Principal Problem: Major depressive disorder, recurrent episode, severe with mood-congruent psychotic features ([place]) Diagnosis: Principal Problem:   Major depressive disorder, recurrent episode, severe with mood-congruent psychotic features ([place]) Active Problems:   Pelvic fracture ([place])   Inferior pubic ramus fracture, left, closed, initial encounter ([place])  Total Time spent with patient: 15 minutes  Past Psychiatric History: Recurrent depression and anxiety for which she has been on numerous antidepressants in the past.  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   Vision changes     Past Surgical History:  Procedure Laterality Date   CHOLECYSTECTOMY     IR IMAGING GUIDED PORT INSERTION  09/21/2020   LITHOTRIPSY  1984   LYMPH NODE BIOPSY     MASS BIOPSY Left 09/03/2020   Procedure: LEFT NECK JUGULAR NODE;  Surgeon: Rozetta Nunnery, MD;  Location: [facility];  Service: ENT;  Laterality: Left;   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   Procedure: LEFT  TOTAL KNEE ARTHROPLASTY;  Surgeon: Gaynelle Arabian, MD;  Location: [facility];  Service: Orthopedics;  Laterality: Left;   Family History:  Family History  Problem Relation Age of Onset   Hypertension Mother    Stroke Mother    Heart attack Mother        CABG, 5 Stents   Dementia Mother    Cancer Father        Kidney   Asthma Sister    Diabetes Brother        Borderline   Alcohol abuse Brother    Cancer Maternal Aunt        stomach ca   Cancer Maternal Aunt        ovarian ca   Breast cancer Cousin    Colon cancer Neg Hx    Family Psychiatric  History: Unremarkable Social History:  Social History   Substance and Sexual Activity  Alcohol Use No     Social History   Substance and Sexual Activity  Drug Use No    Social History   Socioeconomic History   Marital status: Widowed    Spouse name: Not on file   Number of children: Not on file   Years of education: Not on file   Highest education level: Not on file  Occupational History   Not on file  Tobacco Use   Smoking status: Never   Smokeless tobacco: Never  Substance and Sexual Activity  Alcohol use: No   Drug use: No   Sexual activity: Not Currently    Birth control/protection: Post-menopausal  Other Topics Concern   Not on file  Social History Narrative   Lives alone.     Son is [name].     Social Determinants of Health   Financial Resource Strain: Not on file  Food Insecurity: No Food Insecurity (06/14/2022)   Hunger Vital Sign    Worried About Running Out of Food in the Last Year: Never true    Ran Out of Food in the Last Year: Never true  Transportation Needs: No Transportation Needs (06/14/2022)   PRAPARE - Hydrologist (Medical): No    Lack of Transportation (Non-Medical): No  Physical Activity: Not on file  Stress: Not on file  Social Connections: Not on file   Additional Social History:                         Sleep: Good  Appetite:   Fair  Current Medications: Current Facility-Administered Medications  Medication Dose Route Frequency Provider Last Rate Last Admin   alum & mag hydroxide-simeth (MAALOX/MYLANTA) 200-200-20 MG/5ML suspension 30 mL  30 mL Oral Q4H PRN Parks Ranger, DO       aspirin EC tablet 81 mg  81 mg Oral Daily Parks Ranger, DO   81 mg at 07/22/22 0854   atorvastatin (LIPITOR) tablet 20 mg  20 mg Oral Daily Parks Ranger, DO   20 mg at 07/22/22 0854   cholecalciferol (VITAMIN D3) 25 MCG (1000 UNIT) tablet 1,000 Units  1,000 Units Oral Daily Parks Ranger, DO   1,000 Units at 07/22/22 [id number]   cyanocobalamin (VITAMIN B12) tablet 1,000 mcg  1,000 mcg Oral Daily Parks Ranger, DO   1,000 mcg at 07/22/22 0854   feeding supplement (ENSURE ENLIVE / ENSURE PLUS) liquid 237 mL  237 mL Oral TID BM Parks Ranger, DO   237 mL at 07/21/22 1357   haloperidol (HALDOL) tablet 1 mg  1 mg Oral QPC supper Parks Ranger, DO   1 mg at 07/20/22 1704   ibuprofen (ADVIL) tablet 400 mg  400 mg Oral Q6H PRN [name], [name], MD   400 mg at 07/22/22 0854   lip balm (BLISTEX) ointment   Topical PRN Parks Ranger, DO       LORazepam (ATIVAN) tablet 0.5 mg  0.5 mg Oral Q6H PRN Parks Ranger, DO   0.5 mg at 07/19/22 0109   magnesium hydroxide (MILK OF MAGNESIA) suspension 30 mL  30 mL Oral Daily PRN Parks Ranger, DO   30 mL at 06/24/22 1006   mirtazapine (REMERON) tablet 7.5 mg  7.5 mg Oral QHS Parks Ranger, DO   7.5 mg at 07/21/22 2139   modafinil (PROVIGIL) tablet 200 mg  200 mg Oral Daily Parks Ranger, DO   200 mg at 07/22/22 [id number]   multivitamin with minerals tablet 1 tablet  1 tablet Oral Daily Parks Ranger, DO   1 tablet at 07/22/22 0853   OLANZapine (ZYPREXA) tablet 5 mg  5 mg Oral QHS Parks Ranger, DO   5 mg at 07/21/22 2139   oxyCODONE-acetaminophen (PERCOCET) 7.5-325 MG per tablet 1 tablet   1 tablet Oral Q6H PRN [name], [name], MD   1 tablet at 07/19/22 0008   pantoprazole (PROTONIX) EC tablet 40 mg  40  mg Oral Daily Parks Ranger, DO   40 mg at 07/22/22 [id number]   polyethylene glycol (MIRALAX / GLYCOLAX) packet 17 g  17 g Oral Daily Parks Ranger, DO   17 g at 07/22/22 [id number]   traZODone (DESYREL) tablet 25 mg  25 mg Oral QHS PRN Parks Ranger, DO       venlafaxine XR (EFFEXOR-XR) 24 hr capsule 300 mg  300 mg Oral Q breakfast Parks Ranger, DO   300 mg at 07/22/22 [id number]    Lab Results:  Results for orders placed or performed during the hospital encounter of 06/14/22 (from the past 48 hour(s))  Urinalysis, Routine w reflex microscopic -Urine, Clean Catch     Status: Abnormal   Collection Time: 07/20/22 12:30 PM  Result Value Ref Range   Color, Urine YELLOW (A) YELLOW   APPearance HAZY (A) CLEAR   Specific Gravity, Urine 1.019 1.005 - 1.030   pH 6.0 5.0 - 8.0   Glucose, UA NEGATIVE NEGATIVE mg/dL   Hgb urine dipstick NEGATIVE NEGATIVE   Bilirubin Urine NEGATIVE NEGATIVE   Ketones, ur 5 (A) NEGATIVE mg/dL   Protein, ur NEGATIVE NEGATIVE mg/dL   Nitrite NEGATIVE NEGATIVE   Leukocytes,Ua NEGATIVE NEGATIVE    Comment: Performed at [facility], [address] [place], [place] [address]    Blood Alcohol level:  Lab Results  Component Value Date   [facility] <10 [id number]    Metabolic Disorder Labs: Lab Results  Component Value Date   HGBA1C 5.5 06/10/2022   MPG 111.15 06/10/2022   MPG 105 04/05/2021   No results found for: "PROLACTIN" Lab Results  Component Value Date   CHOL 246 (H) 06/10/2022   TRIG 94 06/10/2022   HDL 57 06/10/2022   CHOLHDL 4.3 06/10/2022   VLDL 19 06/10/2022   LDLCALC 170 (H) 06/10/2022   LDLCALC 67 04/05/2021    Physical Findings: AIMS:  , ,  ,  ,    CIWA:    COWS:     Musculoskeletal: Strength & Muscle Tone: within normal limits Gait & Station: unsteady, unable to stand Patient leans:  N/A  Psychiatric Specialty Exam:  Presentation  General Appearance:  Appropriate for Environment; Casual  Eye Contact: Good  Speech: Clear and Coherent; Normal Rate  Speech Volume: Normal  Handedness: Right   Mood and Affect  Mood: Dysphoric  Affect: Congruent   Thought Process  Thought Processes: Coherent  Descriptions of Associations:Intact  Orientation:Full (Time, Place and Person)  Thought Content:Logical  History of Schizophrenia/Schizoaffective disorder:No  Duration of Psychotic Symptoms:No data recorded Hallucinations:No data recorded Ideas of Reference:None  Suicidal Thoughts:No data recorded Homicidal Thoughts:No data recorded  Sensorium  Memory: Immediate Good; Recent Good; Remote Good  Judgment: Good  Insight: Good   Executive Functions  Concentration: Good  Attention Span: Good  Recall: Good  Fund of Knowledge: Good  Language: Good   Psychomotor Activity  Psychomotor Activity:No data recorded  Assets  Assets: Physical Health; Resilience; Social Support; Catering manager; Housing   Sleep  Sleep:No data recorded   Physical Exam: Physical Exam Vitals and nursing note reviewed.  Constitutional:      Appearance: Normal appearance. She is normal weight.  Neurological:     General: No focal deficit present.     Mental Status: She is alert and oriented to person, place, and time.  Psychiatric:        Attention and Perception: Attention normal.        Mood and Affect:  Mood is depressed. Affect is flat.        Speech: Speech normal.        Behavior: Behavior normal. Behavior is cooperative.        Thought Content: Thought content is paranoid and delusional.        Cognition and Memory: Memory normal. Cognition is impaired.        Judgment: Judgment is inappropriate.    Review of Systems  Constitutional: Negative.   HENT: Negative.    Eyes: Negative.   Respiratory: Negative.    Cardiovascular:  Negative.   Gastrointestinal: Negative.   Genitourinary: Negative.   Musculoskeletal: Negative.   Skin: Negative.   Neurological: Negative.   Endo/Heme/Allergies: Negative.   Psychiatric/Behavioral:  Positive for depression and memory loss. The patient is nervous/anxious.    Blood pressure (!) 142/74, pulse 97, temperature 98.6 F (37 C), temperature source Oral, resp. rate 16, height 5' 2"$  (1.575 m), SpO2 98 %. Body mass index is 20.01 kg/m.   Treatment Plan Summary: Daily contact with patient to assess and evaluate symptoms and progress in treatment, Medication management, and Plan continue current medications.  Parks Ranger, DO 07/22/2022, 10:51 AM

## 2022-07-22 NOTE — BHH Counselor (Signed)
CSW contacted the following places to assist with obtaining home health for pt.   [facility], [phone] -Was unable to provide care/declined.   [facility], [phone] -Stated he could provide home health assistance for pt with OT/PT but not nursing.    CSW will follow up when orders are complete and finalized discharge date established sometime early next week.   Meelah Tallo Martinique, MSW, LCSW-A 2/9/20243:05 PM

## 2022-07-22 NOTE — BH Assessment (Signed)
1905 Received patient sitting in the dayroom with

## 2022-07-22 NOTE — Plan of Care (Signed)
  Problem: Education: Goal: Knowledge of General Education information will improve Description: Including pain rating scale, medication(s)/side effects and non-pharmacologic comfort measures Outcome: Progressing   Problem: Health Behavior/Discharge Planning: Goal: Ability to manage health-related needs will improve Outcome: Progressing   Problem: Clinical Measurements: Goal: Ability to maintain clinical measurements within normal limits will improve Outcome: Progressing Goal: Will remain free from infection Outcome: Progressing Goal: Diagnostic test results will improve Outcome: Progressing Goal: Respiratory complications will improve Outcome: Progressing Goal: Cardiovascular complication will be avoided Outcome: Progressing   Problem: Activity: Goal: Risk for activity intolerance will decrease Outcome: Progressing   Problem: Nutrition: Goal: Adequate nutrition will be maintained Outcome: Progressing   Problem: Elimination: Goal: Will not experience complications related to bowel motility Outcome: Progressing Goal: Will not experience complications related to urinary retention Outcome: Progressing   Problem: Coping: Goal: Level of anxiety will decrease Outcome: Progressing   Problem: Pain Managment: Goal: General experience of comfort will improve Outcome: Progressing   Problem: Self-Concept: Goal: Will verbalize positive feelings about self Outcome: Progressing Goal: Level of anxiety will decrease Outcome: Progressing   Problem: Safety: Goal: Ability to disclose and discuss suicidal ideas will improve Outcome: Progressing Goal: Ability to identify and utilize support systems that promote safety will improve Outcome: Progressing

## 2022-07-22 NOTE — BHH Counselor (Signed)
CSW was contacted by Cathy [name], pt's son regarding updates on pt's discharge.   CSW informed him that CSW was contacted by [facility] and that the facility was not currently taking any patients due to administration restricting numbers. CSW also said that pending options have been contacted but no one has reached back out to CSW.   CSW stated that an alternative plan would be for pt to go home with a family member with home health. Pt's son was open and accepting of that plan.   CSW will inform provider and begin contacting home health facilities within the pt's insurance network and establish care.   Cathy [name], MSW, LCSW-A 2/9/20241:12 PM

## 2022-07-22 NOTE — Progress Notes (Signed)
   07/22/22 0900  Psych Admission Type (Psych Patients Only)  Admission Status Voluntary  Psychosocial Assessment  Patient Complaints None  Eye Contact Fair  Facial Expression Sad  Affect Depressed;Sad  Speech Logical/coherent  Interaction Minimal  Motor Activity Slow  Appearance/Hygiene Unremarkable  Behavior Characteristics Cooperative  Mood Pleasant  Thought Process  Coherency WDL  Content WDL  Delusions None reported or observed  Perception WDL  Hallucination None reported or observed  Judgment WDL  Confusion WDL  Danger to Self  Current suicidal ideation? Denies  Danger to Others  Danger to Others None reported or observed

## 2022-07-22 NOTE — BHH Counselor (Signed)
CSW contacted [facility] regarding pt's discharge date.   CSW stated that provider has set a tentative discharge date for Wednesday/Thursday of next week depending on pt's progress.  He said that he would be staying at pt's home and that home health could reach out to pt's home number at discharge as best contact number.   CSW will follow up with pt's family regarding exact date of discharge once established.   Shakayla Hickox [place], MSW, LCSW-A 2/9/20243:51 PM

## 2022-07-22 NOTE — Progress Notes (Signed)
Patient is alert and oriented times 3. Mood and affect sad/sullen with periodic smiles. Patient c/o mod pain to left pelvis with relief from Advil. She denies SI, HI, and AVH. Also denies feelings of anxiety and depression at this time. States she slept good last night. Morning meds given whole by mouth W/O difficulty. Patient consumed breakfast in the dayroom- appetite better. Patient remains on unit with Q15 minute checks and 1:1 sitter in place.

## 2022-07-22 NOTE — Progress Notes (Signed)
   07/22/22 1300  Spiritual Encounters  Type of Visit Follow up  Care provided to: Patient  Conversation partners present during encounter Nurse  Referral source Chaplain assessment  Reason for visit Routine spiritual support  OnCall Visit No  Spiritual Framework  Presenting Themes Goals in life/care;Courage hope and growth  Interventions  Spiritual Care Interventions Made Prayer;Encouragement  Intervention Outcomes  Outcomes Awareness of support;Reduced isolation  Spiritual Care Plan  Spiritual Care Issues Still Outstanding No further spiritual care needs at this time (see row info)   Chaplain visited pt during routine rounding. Pt  requested a prayer for family and a breakthrough in a finding a placement home for her. Pt was grateful for the visit.

## 2022-07-23 DIAGNOSIS — F332 Major depressive disorder, recurrent severe without psychotic features: Secondary | ICD-10-CM | POA: Diagnosis not present

## 2022-07-23 NOTE — Progress Notes (Signed)
   07/23/22 0800  Psych Admission Type (Psych Patients Only)  Admission Status Voluntary  Psychosocial Assessment  Patient Complaints None  Eye Contact Fair  Facial Expression Sad  Affect Depressed;Sad  Speech Logical/coherent  Interaction Minimal  Motor Activity Slow  Appearance/Hygiene Unremarkable  Behavior Characteristics Cooperative  Mood Pleasant  Thought Process  Coherency WDL  Content WDL  Delusions None reported or observed  Perception WDL  Hallucination None reported or observed  Judgment WDL  Confusion WDL  Danger to Self  Current suicidal ideation? Denies  Danger to Others  Danger to Others None reported or observed

## 2022-07-23 NOTE — Group Note (Signed)
Date:  07/23/2022 Time:  5:45 PM  Group Topic/Focus:  Rediscovering [name]:   The focus of this group is to explore various ways to relieve stress in a positive manner.    Participation Level:  Active  Participation Quality:  Appropriate  Affect:  Appropriate  Cognitive:  Alert  Insight: Appropriate  Engagement in Group:  Engaged  Modes of Intervention:  Discussion  Additional Comments:  NA  Torry Adamczak l Christol Thetford 07/23/2022, 5:45 PM

## 2022-07-23 NOTE — Progress Notes (Signed)
Patient is alert and oriented times 3. Mood and affect sad/sullen with periodic smiles. Patient c/o mod pain to left pelvis with relief from Advil. She denies SI, HI, and AVH. Also denies feelings of anxiety and depression at this time. States she slept good last night. Morning meds given whole by mouth W/O difficulty. Patient consumed breakfast in the dayroom- appetite better. Patient remains on unit with Q15 minute checks and 1:1 sitter in place.

## 2022-07-23 NOTE — Plan of Care (Signed)
  Problem: Education: Goal: Knowledge of General Education information will improve Description: Including pain rating scale, medication(s)/side effects and non-pharmacologic comfort measures Outcome: Progressing   Problem: Health Behavior/Discharge Planning: Goal: Ability to manage health-related needs will improve Outcome: Progressing   Problem: Clinical Measurements: Goal: Ability to maintain clinical measurements within normal limits will improve Outcome: Progressing Goal: Will remain free from infection Outcome: Progressing Goal: Diagnostic test results will improve Outcome: Progressing Goal: Respiratory complications will improve Outcome: Progressing Goal: Cardiovascular complication will be avoided Outcome: Progressing   Problem: Activity: Goal: Risk for activity intolerance will decrease Outcome: Progressing   Problem: Nutrition: Goal: Adequate nutrition will be maintained Outcome: Progressing   Problem: Coping: Goal: Level of anxiety will decrease Outcome: Progressing   Problem: Education: Goal: Utilization of techniques to improve thought processes will improve Outcome: Progressing Goal: Knowledge of the prescribed therapeutic regimen will improve Outcome: Progressing   Problem: Activity: Goal: Interest or engagement in leisure activities will improve Outcome: Progressing Goal: Imbalance in normal sleep/wake cycle will improve Outcome: Progressing   Problem: Coping: Goal: Coping ability will improve Outcome: Progressing Goal: Will verbalize feelings Outcome: Progressing   Problem: Self-Concept: Goal: Will verbalize positive feelings about self Outcome: Progressing Goal: Level of anxiety will decrease Outcome: Progressing

## 2022-07-23 NOTE — BH IP Treatment Plan (Signed)
Interdisciplinary Treatment and Diagnostic Plan Update  07/23/2022 Time of Session: [place] MRN: [id number]  Principal Diagnosis: Major depressive disorder, recurrent episode, severe with mood-congruent psychotic features ([place])  Secondary Diagnoses: Principal Problem:   Major depressive disorder, recurrent episode, severe with mood-congruent psychotic features ([place]) Active Problems:   Pelvic fracture ([place])   Inferior pubic ramus fracture, left, closed, initial encounter ([place])   Current Medications:  Current Facility-Administered Medications  Medication Dose Route Frequency Provider Last Rate Last Admin   alum & mag hydroxide-simeth (MAALOX/MYLANTA) 200-200-20 MG/5ML suspension 30 mL  30 mL Oral Q4H PRN Parks Ranger, DO       aspirin EC tablet 81 mg  81 mg Oral Daily Parks Ranger, DO   81 mg at 07/23/22 0830   atorvastatin (LIPITOR) tablet 20 mg  20 mg Oral Daily Parks Ranger, DO   20 mg at 07/23/22 0830   cholecalciferol (VITAMIN D3) 25 MCG (1000 UNIT) tablet 1,000 Units  1,000 Units Oral Daily Parks Ranger, DO   1,000 Units at 07/23/22 0830   cyanocobalamin (VITAMIN B12) tablet 1,000 mcg  1,000 mcg Oral Daily Parks Ranger, DO   1,000 mcg at 07/23/22 [id number]   feeding supplement (ENSURE ENLIVE / ENSURE PLUS) liquid 237 mL  237 mL Oral TID BM Parks Ranger, DO   237 mL at 07/21/22 1357   haloperidol (HALDOL) tablet 1 mg  1 mg Oral QPC supper Parks Ranger, DO   1 mg at 07/22/22 1753   ibuprofen (ADVIL) tablet 400 mg  400 mg Oral Q6H PRN [name], [name], MD   400 mg at 07/23/22 0830   lip balm (BLISTEX) ointment   Topical PRN Parks Ranger, DO       LORazepam (ATIVAN) tablet 0.5 mg  0.5 mg Oral Q6H PRN Parks Ranger, DO   0.5 mg at 07/19/22 0109   magnesium hydroxide (MILK OF MAGNESIA) suspension 30 mL  30 mL Oral Daily PRN Parks Ranger, DO   30 mL at 06/24/22 1006    mirtazapine (REMERON) tablet 7.5 mg  7.5 mg Oral QHS Parks Ranger, DO   7.5 mg at 07/22/22 2119   modafinil (PROVIGIL) tablet 200 mg  200 mg Oral Daily Parks Ranger, DO   200 mg at 07/23/22 0830   multivitamin with minerals tablet 1 tablet  1 tablet Oral Daily Parks Ranger, DO   1 tablet at 07/23/22 0830   OLANZapine (ZYPREXA) tablet 5 mg  5 mg Oral QHS Parks Ranger, DO   5 mg at 07/22/22 2119   oxyCODONE-acetaminophen (PERCOCET) 7.5-325 MG per tablet 1 tablet  1 tablet Oral Q6H PRN [name], [name], MD   1 tablet at 07/19/22 0008   pantoprazole (PROTONIX) EC tablet 40 mg  40 mg Oral Daily Parks Ranger, DO   40 mg at 07/23/22 0830   polyethylene glycol (MIRALAX / GLYCOLAX) packet 17 g  17 g Oral Daily Parks Ranger, DO   17 g at 07/23/22 [id number]   traZODone (DESYREL) tablet 25 mg  25 mg Oral QHS PRN Parks Ranger, DO       venlafaxine XR (EFFEXOR-XR) 24 hr capsule 300 mg  300 mg Oral Q breakfast Parks Ranger, DO   300 mg at 07/23/22 0830   PTA Medications: Medications Prior to Admission  Medication Sig Dispense Refill Last Dose   albuterol (VENTOLIN HFA) 108 (90 Base) MCG/ACT inhaler Inhale  2 puffs into the lungs every 6 (six) hours as needed for wheezing or shortness of breath. 8 g 2    aspirin EC 81 MG tablet Take 81 mg by mouth daily.      atorvastatin (LIPITOR) 20 MG tablet Take 1 tablet (20 mg total) by mouth daily. 90 tablet 1    cetirizine (ZYRTEC) 10 MG tablet Take 1 tablet (10 mg total) by mouth daily. 90 tablet 1    cholecalciferol (CHOLECALCIFEROL) 25 MCG tablet Take 1 tablet (1,000 Units total) by mouth daily.      cyanocobalamin (VITAMIN B12) 1000 MCG tablet Take 1 tablet (1,000 mcg total) by mouth daily.      denosumab (PROLIA) 60 MG/ML SOSY injection Inject 60 mg into the skin every 6 (six) months.      estradiol (ESTRACE) 0.1 MG/GM vaginal cream Discard plastic applicator. Insert blueberry size  amount of cream on finger in vagina daily x1 week then 2x per week.      fluticasone furoate-vilanterol (BREO ELLIPTA) 100-25 MCG/ACT AEPB Inhale 1 puff into the lungs daily. (Patient taking differently: Inhale 1 puff into the lungs daily as needed.) 60 each 5    glucose blood (ACCU-CHEK AVIVA PLUS) test strip Use to Monitor blood sugars daily. DX. E11.9 100 each 5    hydrochlorothiazide (MICROZIDE) 12.5 MG capsule Take 1 capsule (12.5 mg total) by mouth daily. 90 capsule 1    hydroxypropyl methylcellulose / hypromellose (ISOPTO TEARS / GONIOVISC) 2.5 % ophthalmic solution Place 2 drops into both eyes 3 (three) times daily as needed for dry eyes.      lidocaine-prilocaine (EMLA) cream Apply to affected area once (Patient taking differently: 1 Application as needed. Apply to affected area to port as needed) 30 g 3    losartan (COZAAR) 100 MG tablet TAKE 1 TABLET BY MOUTH ONCE A DAY 90 tablet 1    metoprolol succinate (TOPROL-XL) 50 MG 24 hr tablet Take 1 tablet (50 mg total) by mouth daily. Take with or immediately following a meal. 90 tablet 1    Multiple Vitamin (MULTIVITAMIN) capsule Take 1 capsule by mouth daily.      omeprazole (PRILOSEC) 20 MG capsule Take 1 capsule (20 mg total) by mouth daily. 90 capsule 1    venlafaxine XR (EFFEXOR XR) 75 MG 24 hr capsule Take 2 capsules (150 mg total) by mouth daily with breakfast. (Patient taking differently: Take 75 mg by mouth daily with breakfast.)       Patient Stressors:    Patient Strengths:    Treatment Modalities: Medication Management, Group therapy, Case management,  1 to 1 session with clinician, Psychoeducation, Recreational therapy.   Physician Treatment Plan for Primary Diagnosis: Major depressive disorder, recurrent episode, severe with mood-congruent psychotic features ([place]) Long Term Goal(s): Improvement in symptoms so as ready for discharge   Short Term Goals: Ability to identify changes in lifestyle to reduce recurrence of  condition will improve Ability to verbalize feelings will improve Ability to disclose and discuss suicidal ideas Ability to demonstrate self-control will improve Ability to identify and develop effective coping behaviors will improve Ability to maintain clinical measurements within normal limits will improve Compliance with prescribed medications will improve Ability to identify triggers associated with substance abuse/mental health issues will improve  Medication Management: Evaluate patient's response, side effects, and tolerance of medication regimen.  Therapeutic Interventions: 1 to 1 sessions, Unit Group sessions and Medication administration.  Evaluation of Outcomes: Progressing  Physician Treatment Plan for Secondary Diagnosis: Principal Problem:  Major depressive disorder, recurrent episode, severe with mood-congruent psychotic features ([place]) Active Problems:   Pelvic fracture ([place])   Inferior pubic ramus fracture, left, closed, initial encounter ([place])  Long Term Goal(s): Improvement in symptoms so as ready for discharge   Short Term Goals: Ability to identify changes in lifestyle to reduce recurrence of condition will improve Ability to verbalize feelings will improve Ability to disclose and discuss suicidal ideas Ability to demonstrate self-control will improve Ability to identify and develop effective coping behaviors will improve Ability to maintain clinical measurements within normal limits will improve Compliance with prescribed medications will improve Ability to identify triggers associated with substance abuse/mental health issues will improve     Medication Management: Evaluate patient's response, side effects, and tolerance of medication regimen.  Therapeutic Interventions: 1 to 1 sessions, Unit Group sessions and Medication administration.  Evaluation of Outcomes: Progressing   RN Treatment Plan for Primary Diagnosis: Major depressive disorder, recurrent  episode, severe with mood-congruent psychotic features ([employer]) Long Term Goal(s): Knowledge of disease and therapeutic regimen to maintain health will improve  Short Term Goals: Ability to identify and develop effective coping behaviors will improve and Compliance with prescribed medications will improve  Medication Management: RN will administer medications as ordered by provider, will assess and evaluate patient's response and provide education to patient for prescribed medication. RN will report any adverse and/or side effects to prescribing provider.  Therapeutic Interventions: 1 on 1 counseling sessions, Psychoeducation, Medication administration, Evaluate responses to treatment, Monitor vital signs and CBGs as ordered, Perform/monitor CIWA, COWS, AIMS and Fall Risk screenings as ordered, Perform wound care treatments as ordered.  Evaluation of Outcomes: Progressing   LCSW Treatment Plan for Primary Diagnosis: Major depressive disorder, recurrent episode, severe with mood-congruent psychotic features ([place]) Long Term Goal(s): Safe transition to appropriate next level of care at discharge, Engage patient in therapeutic group addressing interpersonal concerns.  Short Term Goals: Engage patient in aftercare planning with referrals and resources and Increase skills for wellness and recovery  Therapeutic Interventions: Assess for all discharge needs, 1 to 1 time with Social worker, Explore available resources and support systems, Assess for adequacy in community support network, Educate family and significant other(s) on suicide prevention, Complete Psychosocial Assessment, Interpersonal group therapy.  Evaluation of Outcomes: Progressing   Progress in Treatment: Attending groups: Yes. Participating in groups: Yes. Taking medication as prescribed: Yes. Toleration medication: Yes. Family/Significant other contact made: Yes, individual(s) contacted:  with son Tom Patient understands diagnosis:  Yes. Discussing patient identified problems/goals with staff: Yes. Medical problems stabilized or resolved: Yes. Denies suicidal/homicidal ideation: Yes. Issues/concerns per patient self-inventory: No. Other: none  New problem(s) identified: No, Describe:  none  New Short Term/Long Term Goal(s):  Patient Goals:    Discharge Plan or Barriers: outpt treatment [facility]  Reason for Continuation of Hospitalization: Medication stabilization  Estimated Length of Stay:2-4 days  Last 3 [place] Suicide Severity Risk Score: Flowsheet Row Admission (Current) from 06/14/2022 in [facility] from 06/10/2022 in [facility] Admission (Discharged) from 09/03/2020 in [facility] High Risk No Risk No Risk       Last PHQ 2/9 Scores:    08/10/2020    9:35 AM 01/13/2020    3:59 PM 07/16/2019    9:36 AM  Depression screen PHQ 2/9  Decreased Interest 0 0 0  Down, Depressed, Hopeless 0 0 0  PHQ - 2 Score 0 0 0    Scribe for Treatment Team:  Joanne Chars, LCSW 07/23/2022 11:16 AM

## 2022-07-23 NOTE — Progress Notes (Signed)
[facility] MD Progress Note  07/23/2022 11:44 AM Cathy [name]  MRN:  [id number] Subjective: Cathy [name] is seen on rounds.  Staff states that she is doing well.  She ate breakfast this morning.  She asked me when she can go home.  I told her soon.  I discussed with social work who had spoken to her son that we will plan on her going home probably Wednesday or Thursday when they can arrange supervision and rehab. Principal Problem: Major depressive disorder, recurrent episode, severe with mood-congruent psychotic features ([place]) Diagnosis: Principal Problem:   Major depressive disorder, recurrent episode, severe with mood-congruent psychotic features ([place]) Active Problems:   Pelvic fracture ([place])   Inferior pubic ramus fracture, left, closed, initial encounter ([place])  Total Time spent with patient: 15 minutes  Past Psychiatric History: History of depression and anxiety.  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   Vision changes     Past Surgical History:  Procedure Laterality Date   CHOLECYSTECTOMY     IR IMAGING GUIDED PORT INSERTION  09/21/2020   LITHOTRIPSY  1984   LYMPH NODE BIOPSY     MASS BIOPSY Left 09/03/2020   Procedure: LEFT NECK JUGULAR NODE;  Surgeon: Rozetta Nunnery, MD;  Location: [facility];  Service: ENT;  Laterality: Left;   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   Procedure: LEFT TOTAL KNEE ARTHROPLASTY;  Surgeon: Gaynelle Arabian,  MD;  Location: [facility];  Service: Orthopedics;  Laterality: Left;   Family History:  Family History  Problem Relation Age of Onset   Hypertension Mother    Stroke Mother    Heart attack Mother        CABG, 5 Stents   Dementia Mother    Cancer Father        Kidney   Asthma Sister    Diabetes Brother        Borderline   Alcohol abuse Brother    Cancer Maternal Aunt        stomach ca   Cancer Maternal Aunt        ovarian ca   Breast cancer Cousin    Colon cancer Neg Hx    Family Psychiatric  History: Unremarkable Social History:  Social History   Substance and Sexual Activity  Alcohol Use No     Social History   Substance and Sexual Activity  Drug Use No    Social History   Socioeconomic History   Marital status: Widowed    Spouse name: Not on file   Number of children: Not on file   Years of education: Not on file   Highest education level: Not on file  Occupational History   Not on file  Tobacco Use   Smoking status: Never   Smokeless tobacco: Never  Substance and Sexual Activity   Alcohol use: No  Drug use: No   Sexual activity: Not Currently    Birth control/protection: Post-menopausal  Other Topics Concern   Not on file  Social History Narrative   Lives alone.     Son is [name].     Social Determinants of Health   Financial Resource Strain: Not on file  Food Insecurity: No Food Insecurity (06/14/2022)   Hunger Vital Sign    Worried About Running Out of Food in the Last Year: Never true    Ran Out of Food in the Last Year: Never true  Transportation Needs: No Transportation Needs (06/14/2022)   PRAPARE - Hydrologist (Medical): No    Lack of Transportation (Non-Medical): No  Physical Activity: Not on file  Stress: Not on file  Social Connections: Not on file   Additional Social History:                         Sleep: Good  Appetite:  Good  Current Medications: Current Facility-Administered  Medications  Medication Dose Route Frequency Provider Last Rate Last Admin   alum & mag hydroxide-simeth (MAALOX/MYLANTA) 200-200-20 MG/5ML suspension 30 mL  30 mL Oral Q4H PRN Parks Ranger, DO       aspirin EC tablet 81 mg  81 mg Oral Daily Parks Ranger, DO   81 mg at 07/23/22 0830   atorvastatin (LIPITOR) tablet 20 mg  20 mg Oral Daily Parks Ranger, DO   20 mg at 07/23/22 0830   cholecalciferol (VITAMIN D3) 25 MCG (1000 UNIT) tablet 1,000 Units  1,000 Units Oral Daily Parks Ranger, DO   1,000 Units at 07/23/22 0830   cyanocobalamin (VITAMIN B12) tablet 1,000 mcg  1,000 mcg Oral Daily Parks Ranger, DO   1,000 mcg at 07/23/22 [id number]   feeding supplement (ENSURE ENLIVE / ENSURE PLUS) liquid 237 mL  237 mL Oral TID BM Parks Ranger, DO   237 mL at 07/21/22 1357   haloperidol (HALDOL) tablet 1 mg  1 mg Oral QPC supper Parks Ranger, DO   1 mg at 07/22/22 1753   ibuprofen (ADVIL) tablet 400 mg  400 mg Oral Q6H PRN [name], [name], MD   400 mg at 07/23/22 0830   lip balm (BLISTEX) ointment   Topical PRN Parks Ranger, DO       LORazepam (ATIVAN) tablet 0.5 mg  0.5 mg Oral Q6H PRN Parks Ranger, DO   0.5 mg at 07/19/22 0109   magnesium hydroxide (MILK OF MAGNESIA) suspension 30 mL  30 mL Oral Daily PRN Parks Ranger, DO   30 mL at 06/24/22 1006   mirtazapine (REMERON) tablet 7.5 mg  7.5 mg Oral QHS Parks Ranger, DO   7.5 mg at 07/22/22 2119   modafinil (PROVIGIL) tablet 200 mg  200 mg Oral Daily Parks Ranger, DO   200 mg at 07/23/22 0830   multivitamin with minerals tablet 1 tablet  1 tablet Oral Daily Parks Ranger, DO   1 tablet at 07/23/22 0830   OLANZapine (ZYPREXA) tablet 5 mg  5 mg Oral QHS Parks Ranger, DO   5 mg at 07/22/22 2119   oxyCODONE-acetaminophen (PERCOCET) 7.5-325 MG per tablet 1 tablet  1 tablet Oral Q6H PRN [name], [name], MD   1 tablet at  07/19/22 0008   pantoprazole (PROTONIX) EC tablet 40 mg  40 mg Oral Daily Caren Griffins  Edward, DO   40 mg at 07/23/22 0830   polyethylene glycol (MIRALAX / GLYCOLAX) packet 17 g  17 g Oral Daily Parks Ranger, DO   17 g at 07/23/22 [id number]   traZODone (DESYREL) tablet 25 mg  25 mg Oral QHS PRN Parks Ranger, DO       venlafaxine XR (EFFEXOR-XR) 24 hr capsule 300 mg  300 mg Oral Q breakfast Parks Ranger, DO   300 mg at 07/23/22 0830    Lab Results: No results found for this or any previous visit (from the past 27 hour(s)).  Blood Alcohol level:  Lab Results  Component Value Date   ETH <10 [id number]    Metabolic Disorder Labs: Lab Results  Component Value Date   HGBA1C 5.5 06/10/2022   MPG 111.15 06/10/2022   MPG 105 04/05/2021   No results found for: "PROLACTIN" Lab Results  Component Value Date   CHOL 246 (H) 06/10/2022   TRIG 94 06/10/2022   HDL 57 06/10/2022   CHOLHDL 4.3 06/10/2022   VLDL 19 06/10/2022   LDLCALC 170 (H) 06/10/2022   LDLCALC 67 04/05/2021    Physical Findings: AIMS:  , ,  ,  ,    CIWA:    COWS:     Musculoskeletal: Strength & Muscle Tone: within normal limits Gait & Station: unsteady, unable to stand Patient leans: N/A  Psychiatric Specialty Exam:  Presentation  General Appearance:  Appropriate for Environment; Casual  Eye Contact: Good  Speech: Clear and Coherent; Normal Rate  Speech Volume: Normal  Handedness: Right   Mood and Affect  Mood: Dysphoric  Affect: Congruent   Thought Process  Thought Processes: Coherent  Descriptions of Associations:Intact  Orientation:Full (Time, Place and Person)  Thought Content:Logical  History of Schizophrenia/Schizoaffective disorder:No  Duration of Psychotic Symptoms:No data recorded Hallucinations:No data recorded Ideas of Reference:None  Suicidal Thoughts:No data recorded Homicidal Thoughts:No data recorded  Sensorium   Memory: Immediate Good; Recent Good; Remote Good  Judgment: Good  Insight: Good   Executive Functions  Concentration: Good  Attention Span: Good  Recall: Good  Fund of Knowledge: Good  Language: Good   Psychomotor Activity  Psychomotor Activity:No data recorded  Assets  Assets: Physical Health; Resilience; Social Support; Catering manager; Housing   Sleep  Sleep:No data recorded   Blood pressure 121/65, pulse 96, temperature 97.7 F (36.5 C), temperature source Oral, resp. rate 19, height 5' 2"$  (1.575 m), SpO2 97 %. Body mass index is 20.01 kg/m.   Treatment Plan Summary: Daily contact with patient to assess and evaluate symptoms and progress in treatment, Medication management, and Plan continue current medications.  Parks Ranger, DO 07/23/2022, 11:44 AM

## 2022-07-24 DIAGNOSIS — F332 Major depressive disorder, recurrent severe without psychotic features: Secondary | ICD-10-CM | POA: Diagnosis not present

## 2022-07-24 NOTE — Progress Notes (Signed)
[facility] MD Progress Note  07/24/2022 11:59 AM Cathy [name]  MRN:  [id number] Subjective: Cathy [name] is seen on rounds.  She is smiling and asking when she can go home and I told her it would be sometime this week.  According to social work, the son is arranging for supervision and then we will get home health physical therapy.  Her mood and affect has improved.  She denies any suicidal ideation.  She is eating better and sleeping better. Principal Problem: Major depressive disorder, recurrent episode, severe with mood-congruent psychotic features ([place]) Diagnosis: Principal Problem:   Major depressive disorder, recurrent episode, severe with mood-congruent psychotic features ([place]) Active Problems:   Pelvic fracture ([place])   Inferior pubic ramus fracture, left, closed, initial encounter ([place])  Total Time spent with patient: 15 minutes  Past Psychiatric History: History of depression and anxiety  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   Vision changes     Past Surgical History:  Procedure Laterality Date   CHOLECYSTECTOMY     IR IMAGING GUIDED PORT INSERTION  09/21/2020   LITHOTRIPSY  1984   LYMPH NODE BIOPSY     MASS BIOPSY Left 09/03/2020   Procedure: LEFT NECK JUGULAR NODE;  Surgeon: Rozetta Nunnery, MD;  Location: [place];  Service: ENT;  Laterality: Left;   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   Procedure: LEFT TOTAL KNEE  ARTHROPLASTY;  Surgeon: Gaynelle Arabian, MD;  Location: [facility];  Service: Orthopedics;  Laterality: Left;   Family History:  Family History  Problem Relation Age of Onset   Hypertension Mother    Stroke Mother    Heart attack Mother        CABG, 5 Stents   Dementia Mother    Cancer Father        Kidney   Asthma Sister    Diabetes Brother        Borderline   Alcohol abuse Brother    Cancer Maternal Aunt        stomach ca   Cancer Maternal Aunt        ovarian ca   Breast cancer Cousin    Colon cancer Neg Hx    Family Psychiatric  History: Unremarkable Social History:  Social History   Substance and Sexual Activity  Alcohol Use No     Social History   Substance and Sexual Activity  Drug Use No    Social History   Socioeconomic History   Marital status: Widowed    Spouse name: Not on file   Number of children: Not on file   Years of education: Not on file   Highest education level: Not on file  Occupational History   Not on file  Tobacco Use   Smoking status: Never   Smokeless tobacco: Never  Substance and Sexual Activity   Alcohol use:  No   Drug use: No   Sexual activity: Not Currently    Birth control/protection: Post-menopausal  Other Topics Concern   Not on file  Social History Narrative   Lives alone.     Son is [name].     Social Determinants of Health   Financial Resource Strain: Not on file  Food Insecurity: No Food Insecurity (06/14/2022)   Hunger Vital Sign    Worried About Running Out of Food in the Last Year: Never true    Ran Out of Food in the Last Year: Never true  Transportation Needs: No Transportation Needs (06/14/2022)   PRAPARE - Hydrologist (Medical): No    Lack of Transportation (Non-Medical): No  Physical Activity: Not on file  Stress: Not on file  Social Connections: Not on file   Additional Social History:                         Sleep: Good  Appetite:  Fair  Current  Medications: Current Facility-Administered Medications  Medication Dose Route Frequency Provider Last Rate Last Admin   alum & mag hydroxide-simeth (MAALOX/MYLANTA) 200-200-20 MG/5ML suspension 30 mL  30 mL Oral Q4H PRN Parks Ranger, DO       aspirin EC tablet 81 mg  81 mg Oral Daily Parks Ranger, DO   81 mg at 07/24/22 0906   atorvastatin (LIPITOR) tablet 20 mg  20 mg Oral Daily Parks Ranger, DO   20 mg at 07/24/22 [id number]   cholecalciferol (VITAMIN D3) 25 MCG (1000 UNIT) tablet 1,000 Units  1,000 Units Oral Daily Parks Ranger, DO   1,000 Units at 07/24/22 [id number]   cyanocobalamin (VITAMIN B12) tablet 1,000 mcg  1,000 mcg Oral Daily Parks Ranger, DO   1,000 mcg at 07/24/22 [id number]   feeding supplement (ENSURE ENLIVE / ENSURE PLUS) liquid 237 mL  237 mL Oral TID BM Parks Ranger, DO   237 mL at 07/21/22 1357   haloperidol (HALDOL) tablet 1 mg  1 mg Oral QPC supper Parks Ranger, DO   1 mg at 07/23/22 1639   ibuprofen (ADVIL) tablet 400 mg  400 mg Oral Q6H PRN [name], [name], MD   400 mg at 07/23/22 0830   lip balm (BLISTEX) ointment   Topical PRN Parks Ranger, DO       LORazepam (ATIVAN) tablet 0.5 mg  0.5 mg Oral Q6H PRN Parks Ranger, DO   0.5 mg at 07/19/22 0109   magnesium hydroxide (MILK OF MAGNESIA) suspension 30 mL  30 mL Oral Daily PRN Parks Ranger, DO   30 mL at 06/24/22 1006   mirtazapine (REMERON) tablet 7.5 mg  7.5 mg Oral QHS Parks Ranger, DO   7.5 mg at 07/23/22 2056   modafinil (PROVIGIL) tablet 200 mg  200 mg Oral Daily Parks Ranger, DO   200 mg at 07/24/22 [id number]   multivitamin with minerals tablet 1 tablet  1 tablet Oral Daily Parks Ranger, DO   1 tablet at 07/24/22 0906   OLANZapine (ZYPREXA) tablet 5 mg  5 mg Oral QHS Parks Ranger, DO   5 mg at 07/23/22 2056   oxyCODONE-acetaminophen (PERCOCET) 7.5-325 MG per tablet 1 tablet  1 tablet Oral  Q6H PRN [name], [name], MD   1 tablet at 07/19/22 0008   pantoprazole (PROTONIX) EC tablet 40 mg  40 mg Oral  Daily Parks Ranger, DO   40 mg at 07/24/22 [id number]   polyethylene glycol (MIRALAX / GLYCOLAX) packet 17 g  17 g Oral Daily Parks Ranger, DO   17 g at 07/24/22 [id number]   traZODone (DESYREL) tablet 25 mg  25 mg Oral QHS PRN Parks Ranger, DO       venlafaxine XR (EFFEXOR-XR) 24 hr capsule 300 mg  300 mg Oral Q breakfast Parks Ranger, DO   300 mg at 07/24/22 [id number]    Lab Results: No results found for this or any previous visit (from the past 48 hour(s)).  Blood Alcohol level:  Lab Results  Component Value Date   ETH <10 [id number]    Metabolic Disorder Labs: Lab Results  Component Value Date   HGBA1C 5.5 06/10/2022   MPG 111.15 06/10/2022   MPG 105 04/05/2021   No results found for: "PROLACTIN" Lab Results  Component Value Date   CHOL 246 (H) 06/10/2022   TRIG 94 06/10/2022   HDL 57 06/10/2022   CHOLHDL 4.3 06/10/2022   VLDL 19 06/10/2022   LDLCALC 170 (H) 06/10/2022   LDLCALC 67 04/05/2021    Physical Findings: AIMS:  , ,  ,  ,    CIWA:    COWS:     Musculoskeletal: Strength & Muscle Tone: within normal limits Gait & Station: unsteady, unable to stand Patient leans: N/A  Psychiatric Specialty Exam:  Presentation  General Appearance:  Appropriate for Environment; Casual  Eye Contact: Good  Speech: Clear and Coherent; Normal Rate  Speech Volume: Normal  Handedness: Right   Mood and Affect  Mood: Dysphoric  Affect: Congruent   Thought Process  Thought Processes: Coherent  Descriptions of Associations:Intact  Orientation:Full (Time, Place and Person)  Thought Content:Logical  History of Schizophrenia/Schizoaffective disorder:No  Duration of Psychotic Symptoms:No data recorded Hallucinations:No data recorded Ideas of Reference:None  Suicidal Thoughts:No data recorded Homicidal Thoughts:No data  recorded  Sensorium  Memory: Immediate Good; Recent Good; Remote Good  Judgment: Good  Insight: Good   Executive Functions  Concentration: Good  Attention Span: Good  Recall: Good  Fund of Knowledge: Good  Language: Good   Psychomotor Activity  Psychomotor Activity:No data recorded  Assets  Assets: Physical Health; Resilience; Social Support; Catering manager; Housing   Sleep  Sleep:No data recorded    Blood pressure 108/66, pulse 92, temperature 98.6 F (37 C), temperature source Oral, resp. rate 17, height 5' 2"$  (1.575 m), SpO2 97 %. Body mass index is 20.01 kg/m.   Treatment Plan Summary: Daily contact with patient to assess and evaluate symptoms and progress in treatment, Medication management, and Plan continue current medications.  [place], DO 07/24/2022, 11:59 AM

## 2022-07-24 NOTE — Plan of Care (Signed)
Data: Patient is appropriate and cooperative to assessment. Patient  Denies SI / HI / AVH. Patient has no complaints at this time. Patient continues to be adherent with scheduled medications. No signs of distress or injury.   Action:  Q x 15 minute observation checks were completed for safety. Patient was provided with education on medications. Patient was offered support and encouragement. Patient was given scheduled medications. Patient  was encourage to attend groups, participate in unit activities and continue with plan of care.    Response:  Patient has no complaints at this time. Patient is receptive to treatment and safety maintained on unit.   Problem: Education: Goal: Knowledge of General Education information will improve Description: Including pain rating scale, medication(s)/side effects and non-pharmacologic comfort measures Outcome: Not Progressing   Problem: Health Behavior/Discharge Planning: Goal: Ability to manage health-related needs will improve Outcome: Not Progressing   Problem: Clinical Measurements: Goal: Ability to maintain clinical measurements within normal limits will improve Outcome: Not Progressing

## 2022-07-24 NOTE — BHH Group Notes (Signed)
[place] Group Notes:  (Nursing/MHT/Case Management/Adjunct)  Date:  07/24/2022  Time:  1:51 PM  Type of Therapy:  Group Therapy  Participation Level:  Active  Participation Quality:  Appropriate and Attentive  Affect:  Appropriate  Cognitive:  Alert and Appropriate  Insight:  Good  Engagement in Group:  Engaged  Modes of Intervention:  Activity and Discussion  Summary of Progress/Problems:  Cathy [name] 07/24/2022, 1:51 PM

## 2022-07-24 NOTE — Plan of Care (Signed)
  Problem: Nutrition: Goal: Adequate nutrition will be maintained Outcome: Progressing   Problem: Coping: Goal: Level of anxiety will decrease 07/24/2022 2207 by Ileene Musa, RN Outcome: Progressing 07/24/2022 2207 by Ileene Musa, RN Outcome: Progressing   Problem: Pain Managment: Goal: General experience of comfort will improve Outcome: Progressing   Problem: Safety: Goal: Ability to remain free from injury will improve Outcome: Progressing

## 2022-07-24 NOTE — BHH Group Notes (Signed)
Patient attended group and engaged in healthy support system group discussion [place] Group Notes:  (Nursing/MHT/Case Management/Adjunct)  Date:  07/24/2022  Time:  8:24 PM  Type of Therapy:  Group Therapy  Participation Level:  Active  Participation Quality:  Attentive  Affect:  Appropriate  Cognitive:  Alert  Insight:  Improving  Engagement in Group:  Engaged  Modes of Intervention:  Activity  Summary of Progress/Problems:  Bradd Canary 07/24/2022, 8:24 PM

## 2022-07-24 NOTE — BHH Group Notes (Signed)
LCSW Wellness Group Note   07/24/2022 2:00pm  Type of Group and Topic: Psychoeducational Group:  Wellness  Participation Level:  Active  Description of Group  Wellness group introduces the topic and its focus on developing healthy habits across the spectrum and its relationship to a decrease in hospital admissions.  Six areas of wellness are discussed: physical, social spiritual, intellectual, occupational, and emotional.  Patients are asked to consider their current wellness habits and to identify areas of wellness where they are interested and able to focus on improvements.    Therapeutic Goals Patients will understand components of wellness and how they can positively impact overall health.  Patients will identify areas of wellness where they have developed good habits. Patients will identify areas of wellness where they would like to make improvements.    Summary of Patient Progress: pt very active and appropriate during small, 2 member group.  Pt read aloud from the material, discussed her current status in different wellness areas.  Pt conclusion was that she was not doing all that well in any of the wellness areas currently.  Pt discussed starting by engaging in activities related to physical and social wellness, possibly reengaging in volunteering or meals on wheels.  Excellent participation today.      Therapeutic Modalities: Cognitive Behavioral Therapy Psychoeducation    Joanne Chars, LCSW

## 2022-07-24 NOTE — BH Assessment (Signed)
1910 Patient at bedside visiting with her brother. She is alert and appears to be enjoying her visit. One on one in place for safety. Will continue to monitor patient for safety.  2130 Patient up in day room for snack but went to bed directly after.   0045 One to one at bedside, she has rested with ends no signs of distress noted. Will continue to monitor for safety.  0300 Patient continues to rest quietly in bed with sitter in place her room to prevent falls.  0630 Patient remains in bed asleep with one on one sitter at bedside for safety. Patient has remained safe throughout this shift. Will continue to monitor for safety.

## 2022-07-25 DIAGNOSIS — F332 Major depressive disorder, recurrent severe without psychotic features: Secondary | ICD-10-CM | POA: Diagnosis not present

## 2022-07-25 NOTE — Group Note (Signed)
Recreation Therapy Group Note   Group Topic:Leisure Education  Group Date: 07/25/2022 Start Time: 1400 End Time: 1445 Facilitators: Vilma Prader, LRT, CTRS Location:  Dayroom  Group Description: Patients encouraged to name their favorite song(s) for LRT to play song through speaker for group to hear. Patient educated on the definition of leisure and the importance of having different leisure interests outside of the hospital. Group discussed how leisure activities can often be used as Radiographer, therapeutic and that listening to music is one example.   Affect/Mood: N/A   Participation Level: Did not attend    Clinical Observations/Individualized Feedback: [name] did not attend group due to being with Physical Therapy.   Plan: Continue to engage patient in RT group sessions 2-3x/week.   Vilma Prader, LRT, CTRS  07/25/2022 3:09 PM

## 2022-07-25 NOTE — Group Note (Deleted)
Date:  07/25/2022 Time:  12:43 AM  Group Topic/Focus:  Number of Participants: 3  Group Focus: anxiety Treatment Modality:  Art Therapy Interventions utilized were confrontation Purpose: regain self-worth     Participation Level:  {BHH PARTICIPATION HD:[id number]  Participation Quality:  {BHH PARTICIPATION QUALITY:22265}  Affect:  {BHH AFFECT:22266}  Cognitive:  {BHH COGNITIVE:22267}  Insight: {BHH Insight2:20797}  Engagement in Group:  {BHH ENGAGEMENT IN J[place]:[id number]  Modes of Intervention:  {BHH MODES OF INTERVENTION:22269}  Additional Comments:  ***  Ileene Musa 07/25/2022, 12:43 AM

## 2022-07-25 NOTE — Group Note (Signed)
Date:  07/24/22 Time:  2045  Group Topic/Focus:   Healthy Communication:   The focus of this group is to discuss communication, barriers to communication, as well as healthy ways to communicate with others. Identifying Needs:   The focus of this group is to help patients identify their personal needs that have been historically problematic and identify healthy behaviors to address their needs. Making Healthy Choices:   The focus of this group is to help patients identify negative/unhealthy choices they were using prior to admission and identify positive/healthier coping strategies to replace them upon discharge. Managing Feelings:   The focus of this group is to identify what feelings patients have difficulty handling and develop a plan to handle them in a healthier way upon discharge. Personal Choices and Values:   The focus of this group is to help patients assess and explore the importance of values in their lives, how their values affect their decisions, how they express their values and what opposes their expression. Self Care:   The focus of this group is to help patients understand the importance of self-care in order to improve or restore emotional, physical, spiritual, interpersonal, and financial health. Self Esteem Action Plan:   The focus of this group is to help patients create a plan to continue to build self-esteem after discharge.    Participation Level:  Active  Participation Quality:  Appropriate  Affect:  Appropriate  Cognitive:  Alert  Insight: Good  Engagement in Group:  Engaged  Modes of Intervention:  Discussion  Additional Comments:    Ileene Musa 07/25/2022, 12:30 AM

## 2022-07-25 NOTE — Plan of Care (Signed)
  Problem: Education: Goal: Knowledge of General Education information will improve Description: Including pain rating scale, medication(s)/side effects and non-pharmacologic comfort measures Outcome: Progressing   Problem: Health Behavior/Discharge Planning: Goal: Ability to manage health-related needs will improve Outcome: Progressing   Problem: Clinical Measurements: Goal: Ability to maintain clinical measurements within normal limits will improve Outcome: Progressing Goal: Will remain free from infection Outcome: Progressing Goal: Diagnostic test results will improve Outcome: Progressing Goal: Respiratory complications will improve Outcome: Progressing Goal: Cardiovascular complication will be avoided Outcome: Progressing   Problem: Activity: Goal: Risk for activity intolerance will decrease Outcome: Progressing   Problem: Nutrition: Goal: Adequate nutrition will be maintained Outcome: Progressing   Problem: Coping: Goal: Level of anxiety will decrease Outcome: Progressing   Problem: Elimination: Goal: Will not experience complications related to bowel motility Outcome: Progressing Goal: Will not experience complications related to urinary retention Outcome: Progressing   Problem: Self-Concept: Goal: Will verbalize positive feelings about self Outcome: Progressing Goal: Level of anxiety will decrease Outcome: Progressing   Problem: Safety: Goal: Ability to disclose and discuss suicidal ideas will improve Outcome: Progressing Goal: Ability to identify and utilize support systems that promote safety will improve Outcome: Progressing   Problem: Role Relationship: Goal: Will demonstrate positive changes in social behaviors and relationships Outcome: Progressing

## 2022-07-25 NOTE — Progress Notes (Signed)
Patient is alert and oriented times 3. Mood and affect sad/sullen. Patient denies pain. She denies SI, HI, and AVH. Also denies feelings of anxiety and depression at this time. States she slept good last night. Morning meds given whole by mouth W/O difficulty. Patient consumed meals in the dayroom- appetite fair. Patient remains on unit with Q15 minute checks and 1:1 sitter in place.

## 2022-07-25 NOTE — Progress Notes (Signed)
[facility] MD Progress Note  07/25/2022 11:07 AM VERTIS VALLIANT  MRN:  [id number] Subjective: Cathy [name] is seen on rounds.  She has been compliant with medications.  She is sleeping better and eating better.  She still needs a lot of prompting and assistance.  Social work and family are working on providing supervision upon discharge.  Her delusions are fixed but she does not talk about them. Principal Problem: Major depressive disorder, recurrent episode, severe with mood-congruent psychotic features ([place]) Diagnosis: Principal Problem:   Major depressive disorder, recurrent episode, severe with mood-congruent psychotic features ([place]) Active Problems:   Pelvic fracture ([place])   Inferior pubic ramus fracture, left, closed, initial encounter ([place])  Total Time spent with patient: 15 minutes  Past Psychiatric History: History of depression and anxiety  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   Vision changes     Past Surgical History:  Procedure Laterality Date   CHOLECYSTECTOMY     IR IMAGING GUIDED PORT INSERTION  09/21/2020   LITHOTRIPSY  1984   LYMPH NODE BIOPSY     MASS BIOPSY Left 09/03/2020   Procedure: LEFT NECK JUGULAR NODE;  Surgeon: Rozetta Nunnery, MD;  Location: [facility];  Service: ENT;  Laterality: Left;   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   Procedure: LEFT TOTAL KNEE ARTHROPLASTY;  Surgeon: Gaynelle Arabian, MD;  Location: [facility]  [facility];  Service: Orthopedics;  Laterality: Left;   Family History:  Family History  Problem Relation Age of Onset   Hypertension Mother    Stroke Mother    Heart attack Mother        CABG, 5 Stents   Dementia Mother    Cancer Father        Kidney   Asthma Sister    Diabetes Brother        Borderline   Alcohol abuse Brother    Cancer Maternal Aunt        stomach ca   Cancer Maternal Aunt        ovarian ca   Breast cancer Cousin    Colon cancer Neg Hx    Family Psychiatric  History: Unremarkable Social History:  Social History   Substance and Sexual Activity  Alcohol Use No     Social History   Substance and Sexual Activity  Drug Use No    Social History   Socioeconomic History   Marital status: Widowed    Spouse name: Not on file   Number of children: Not on file   Years of education: Not on file   Highest education level: Not on file  Occupational History   Not on file  Tobacco Use   Smoking status: Never   Smokeless tobacco: Never  Substance and Sexual Activity   Alcohol use: No   Drug use: No   Sexual activity: Not Currently  Birth control/protection: Post-menopausal  Other Topics Concern   Not on file  Social History Narrative   Lives alone.     Son is [name].     Social Determinants of Health   Financial Resource Strain: Not on file  Food Insecurity: No Food Insecurity (06/14/2022)   Hunger Vital Sign    Worried About Running Out of Food in the Last Year: Never true    Ran Out of Food in the Last Year: Never true  Transportation Needs: No Transportation Needs (06/14/2022)   PRAPARE - Hydrologist (Medical): No    Lack of Transportation (Non-Medical): No  Physical Activity: Not on file  Stress: Not on file  Social Connections: Not on file   Additional Social History:                         Sleep: Good  Appetite:  Fair  Current Medications: Current Facility-Administered Medications   Medication Dose Route Frequency Provider Last Rate Last Admin   alum & mag hydroxide-simeth (MAALOX/MYLANTA) 200-200-20 MG/5ML suspension 30 mL  30 mL Oral Q4H PRN Parks Ranger, DO   30 mL at 07/24/22 1948   aspirin EC tablet 81 mg  81 mg Oral Daily Parks Ranger, DO   81 mg at 07/25/22 [id number]   atorvastatin (LIPITOR) tablet 20 mg  20 mg Oral Daily Parks Ranger, DO   20 mg at 07/25/22 [id number]   cholecalciferol (VITAMIN D3) 25 MCG (1000 UNIT) tablet 1,000 Units  1,000 Units Oral Daily Parks Ranger, DO   1,000 Units at 07/25/22 [id number]   cyanocobalamin (VITAMIN B12) tablet 1,000 mcg  1,000 mcg Oral Daily Parks Ranger, DO   1,000 mcg at 07/25/22 [id number]   feeding supplement (ENSURE ENLIVE / ENSURE PLUS) liquid 237 mL  237 mL Oral TID BM Parks Ranger, DO   237 mL at 07/21/22 1357   haloperidol (HALDOL) tablet 1 mg  1 mg Oral QPC supper Parks Ranger, DO   1 mg at 07/24/22 1700   ibuprofen (ADVIL) tablet 400 mg  400 mg Oral Q6H PRN [name], [name], MD   400 mg at 07/23/22 0830   lip balm (BLISTEX) ointment   Topical PRN Parks Ranger, DO       LORazepam (ATIVAN) tablet 0.5 mg  0.5 mg Oral Q6H PRN Parks Ranger, DO   0.5 mg at 07/19/22 0109   magnesium hydroxide (MILK OF MAGNESIA) suspension 30 mL  30 mL Oral Daily PRN Parks Ranger, DO   30 mL at 06/24/22 1006   mirtazapine (REMERON) tablet 7.5 mg  7.5 mg Oral QHS Parks Ranger, DO   7.5 mg at 07/24/22 1947   modafinil (PROVIGIL) tablet 200 mg  200 mg Oral Daily Parks Ranger, DO   200 mg at 07/25/22 [id number]   multivitamin with minerals tablet 1 tablet  1 tablet Oral Daily Parks Ranger, DO   1 tablet at 07/25/22 0927   OLANZapine (ZYPREXA) tablet 5 mg  5 mg Oral QHS Parks Ranger, DO   5 mg at 07/24/22 1947   oxyCODONE-acetaminophen (PERCOCET) 7.5-325 MG per tablet 1 tablet  1 tablet Oral Q6H PRN [name], [name], MD   1  tablet at 07/19/22 0008   pantoprazole (PROTONIX) EC tablet 40 mg  40 mg Oral Daily Parks Ranger, DO   40 mg at 07/25/22 [phone]  polyethylene glycol (MIRALAX / GLYCOLAX) packet 17 g  17 g Oral Daily Parks Ranger, DO   17 g at 07/25/22 [id number]   traZODone (DESYREL) tablet 25 mg  25 mg Oral QHS PRN Parks Ranger, DO       venlafaxine XR (EFFEXOR-XR) 24 hr capsule 300 mg  300 mg Oral Q breakfast Parks Ranger, DO   300 mg at 07/25/22 [id number]    Lab Results: No results found for this or any previous visit (from the past 48 hour(s)).  Blood Alcohol level:  Lab Results  Component Value Date   ETH <10 [id number]    Metabolic Disorder Labs: Lab Results  Component Value Date   HGBA1C 5.5 06/10/2022   MPG 111.15 06/10/2022   MPG 105 04/05/2021   No results found for: "PROLACTIN" Lab Results  Component Value Date   CHOL 246 (H) 06/10/2022   TRIG 94 06/10/2022   HDL 57 06/10/2022   CHOLHDL 4.3 06/10/2022   VLDL 19 06/10/2022   LDLCALC 170 (H) 06/10/2022   LDLCALC 67 04/05/2021    Physical Findings: AIMS:  , ,  ,  ,    CIWA:    COWS:     Musculoskeletal: Strength & Muscle Tone: within normal limits Gait & Station: unsteady, unable to stand Patient leans: N/A  Psychiatric Specialty Exam:  Presentation  General Appearance:  Appropriate for Environment; Casual  Eye Contact: Good  Speech: Clear and Coherent; Normal Rate  Speech Volume: Normal  Handedness: Right   Mood and Affect  Mood: Dysphoric  Affect: Congruent   Thought Process  Thought Processes: Coherent  Descriptions of Associations:Intact  Orientation:Full (Time, Place and Person)  Thought Content:Logical  History of Schizophrenia/Schizoaffective disorder:No  Duration of Psychotic Symptoms:No data recorded Hallucinations:No data recorded Ideas of Reference:None  Suicidal Thoughts:No data recorded Homicidal Thoughts:No data recorded  Sensorium   Memory: Immediate Good; Recent Good; Remote Good  Judgment: Good  Insight: Good   Executive Functions  Concentration: Good  Attention Span: Good  Recall: Good  Fund of Knowledge: Good  Language: Good   Psychomotor Activity  Psychomotor Activity:No data recorded  Assets  Assets: Physical Health; Resilience; Social Support; Catering manager; Housing   Sleep  Sleep:No data recorded   Physical Exam: Physical Exam Vitals and nursing note reviewed.  Constitutional:      Appearance: Normal appearance. She is normal weight.  Neurological:     General: No focal deficit present.     Mental Status: She is alert and oriented to person, place, and time.  Psychiatric:        Attention and Perception: Attention and perception normal.        Mood and Affect: Mood is depressed. Affect is flat.        Speech: Speech normal.        Behavior: Behavior normal. Behavior is cooperative.        Thought Content: Thought content is paranoid and delusional.        Cognition and Memory: Memory normal. Cognition is impaired.        Judgment: Judgment is inappropriate.    Review of Systems  Constitutional: Negative.   HENT: Negative.    Eyes: Negative.   Respiratory: Negative.    Cardiovascular: Negative.   Gastrointestinal: Negative.   Genitourinary: Negative.   Musculoskeletal: Negative.   Skin: Negative.   Neurological: Negative.   Endo/Heme/Allergies: Negative.   Psychiatric/Behavioral:  Positive for depression.    Blood pressure 133/74, pulse 88, temperature  99.1 F (37.3 C), temperature source Oral, resp. rate 18, height 5' 2"$  (1.575 m), SpO2 97 %. Body mass index is 20.01 kg/m.   Treatment Plan Summary: Daily contact with patient to assess and evaluate symptoms and progress in treatment, Medication management, and Plan continue current medications.  [place], DO 07/25/2022, 11:07 AM

## 2022-07-25 NOTE — BHH Group Notes (Signed)
[facility] Group Notes:  (Nursing/MHT/Case Management/Adjunct)  Date:  07/25/2022  Time:  10:01 AM  Type of Therapy:   Coloring Group & card making.  Participation Level:  Active  Participation Quality:  Appropriate  Affect:  Appropriate  Cognitive:  Appropriate  Insight:  Appropriate  Engagement in Group:  Supportive  Modes of Intervention:  Activity  Summary of Progress/Problems:  Cathy [name] 07/25/2022, 10:01 AM

## 2022-07-25 NOTE — Progress Notes (Signed)
Patient is A+O x 3. She denies SI/HI/AVH. She denies anxiety and depression. Affect appropriate. Mood pleasant. Medication adm without any issues. She ate snack and then requested to rest in her bedroom.  Maalox adm at 1948 for heartburn/indigestion. Upon follow up, Maalox provided relief. She slept for 8+ hours.  Patient remains on a 1:1 sitter with Q15 minute unit checks in place.

## 2022-07-25 NOTE — Progress Notes (Signed)
   07/25/22 0957  Psych Admission Type (Psych Patients Only)  Admission Status Voluntary  Psychosocial Assessment  Patient Complaints None  Eye Contact Fair  Facial Expression Fixed smile  Affect Appropriate to circumstance  Speech Soft  Interaction Assertive  Motor Activity Unsteady;Slow  Appearance/Hygiene Unremarkable  Behavior Characteristics Cooperative;Calm  Mood Pleasant  Thought Process  Coherency WDL  Content WDL  Delusions None reported or observed  Perception WDL  Hallucination None reported or observed  Judgment WDL  Confusion WDL  Danger to Self  Current suicidal ideation? Denies  Danger to Others  Danger to Others None reported or observed

## 2022-07-25 NOTE — Group Note (Signed)
Date:  07/25/2022 Time:  8:50 PM  Group Topic/Focus:  Self Care:   The focus of this group is to help patients understand the importance of self-care in order to improve or restore emotional, physical, spiritual, interpersonal, and financial health.    Participation Level:  Active  Participation Quality:  Attentive  Affect:  Excited  Cognitive:  Appropriate  Insight: Improving  Engagement in Group:  Engaged  Modes of Intervention:  Discussion  Additional Comments:    Neville Route 07/25/2022, 8:50 PM

## 2022-07-25 NOTE — Progress Notes (Signed)
Physical Therapy Treatment Patient Details Name: Cathy [name] MRN: [id number] DOB: 1945-09-19 Today's Date: 07/25/2022   History of Present Illness Pt is a 77 y.o. female presenting "voluntarily to [facility] reporting worsening depressive symptoms and suicidal ideation" 06/10/22; transferred to [facility] 06/14/22.  Unwitnessed fall 06/18/22 hitting head. Pt now with fall 07/01/22 and diagnosed with left inferior superior pubic ramus fracture and is WBAT per ortho consult note on 1/20.   PMH includes htn, vision changes, L TKA, lymphoma, depression, anxiety.    PT Comments    Pt was pleasant and motivated to participate during the session and put forth good effort throughout. Pt continued to present with occasional posterior instability during balance training but is tolerating more intensity in her exercises.  Pt able to participate in dynamic ambulation balance training exercises without an AD per below with L hip pain remaining around 2/10 throughout.  Pt educated on corner balance training exercises and how to progress them and demonstrated good carryover during practice.  Pt educated to ensure she has a Nurse, learning disability exercises with pt understanding and agreeing.  Pt remains at an elevated risk for falls and will benefit from PT services in a SNF setting upon discharge to safely address deficits listed in patient problem list for decreased caregiver assistance and eventual return to PLOF.    Recommendations for follow up therapy are one component of a multi-disciplinary discharge planning process, led by the attending physician.  Recommendations may be updated based on patient status, additional functional criteria and insurance authorization.  Follow Up Recommendations  Skilled nursing-short term rehab (<3 hours/day) Can patient physically be transported by private vehicle: Yes   Assistance Recommended at Discharge Frequent or constant Supervision/Assistance  Patient can return home  with the following A little help with walking and/or transfers;A little help with bathing/dressing/bathroom;Assistance with cooking/housework;Direct supervision/assist for medications management;Direct supervision/assist for financial management;Assist for transportation;Help with stairs or ramp for entrance   Equipment Recommendations  Rolling walker (2 wheels)    Recommendations for Other Services       Precautions / Restrictions Precautions Precautions: Fall Restrictions Weight Bearing Restrictions: Yes LLE Weight Bearing: Weight bearing as tolerated     Mobility  Bed Mobility               General bed mobility comments: NT, pt in recliner    Transfers Overall transfer level: Needs assistance Equipment used: Rolling walker (2 wheels) Transfers: Sit to/from Stand Sit to Stand: Supervision           General transfer comment: Good eccentric and concentric control and stability with sit to/from stand transfers from various height surfaces    Ambulation/Gait Ambulation/Gait assistance: Supervision Gait Distance (Feet): 200 Feet Assistive device: Rolling walker (2 wheels) Gait Pattern/deviations: Step-through pattern, Antalgic, Decreased stance time - left, Decreased step length - right Gait velocity: decreased     General Gait Details: Minimally antalgic this session with no LOB with RW and with grossly improved LLE stance time   Stairs             Wheelchair Mobility    Modified Rankin (Stroke Patients Only)       Balance Overall balance assessment: Needs assistance   Sitting balance-Leahy Scale: Normal     Standing balance support: During functional activity, No upper extremity supported, Bilateral upper extremity supported, Reliant on assistive device for balance Standing balance-Leahy Scale: Poor  Cognition Arousal/Alertness: Awake/alert Behavior During Therapy: WFL for tasks  assessed/performed Overall Cognitive Status: Within Functional Limits for tasks assessed                                          Exercises Other Exercises Other Exercises: Corner balance training with training on safe set up with chair back in front of pt and spotter close by to assist; education provided on principles of progression with balance exercises Other Exercises: Dynamic balance training without UE support with side stepping, forwards stepping, and backwards stepping with random direction changes and start/stops Other Exercises: Posterior step reflex training    General Comments        Pertinent Vitals/Pain Pain Assessment Pain Assessment: 0-10 Pain Score: 2  Pain Location: L hip Pain Descriptors / Indicators: Sore Pain Intervention(s): Monitored during session    Home Living                          Prior Function            PT Goals (current goals can now be found in the care plan section) Progress towards PT goals: Progressing toward goals    Frequency    Min 2X/week      PT Plan Current plan remains appropriate    Co-evaluation              AM-PAC PT "6 Clicks" Mobility   Outcome Measure  Help needed turning from your back to your side while in a flat bed without using bedrails?: A Little Help needed moving from lying on your back to sitting on the side of a flat bed without using bedrails?: A Little Help needed moving to and from a bed to a chair (including a wheelchair)?: A Little Help needed standing up from a chair using your arms (e.g., wheelchair or bedside chair)?: A Little Help needed to walk in hospital room?: A Little Help needed climbing 3-5 steps with a railing? : A Little 6 Click Score: 18    End of Session Equipment Utilized During Treatment: Gait belt Activity Tolerance: Patient tolerated treatment well Patient left: in bed;with nursing/sitter in room Nurse Communication: Mobility status PT  Visit Diagnosis: Unsteadiness on feet (R26.81);History of falling (Z91.81);Other abnormalities of gait and mobility (R26.89);Muscle weakness (generalized) (M62.81);Pain Pain - Right/Left: Left Pain - part of body: Hip     Time: [id number] PT Time Calculation (min) (ACUTE ONLY): 24 min  Charges:  $Gait Training: 8-22 mins $Therapeutic Exercise: 8-22 mins                     D. Scott Kamuela Magos PT, DPT 07/25/22, 2:30 PM

## 2022-07-25 NOTE — BHH Counselor (Signed)
CSW contacted pt's son,Thomas Shomaker regarding follow up on date of discharge.   CSW was informed by provider that date of discharge would be scheduled for Wednesday and wanted to relay updated discharge information.   There was no answer, CSW left voicemail with contact information.   Inas [place], MSW, LCSW-A 2/12/20244:24 PM

## 2022-07-26 DIAGNOSIS — F332 Major depressive disorder, recurrent severe without psychotic features: Secondary | ICD-10-CM | POA: Diagnosis not present

## 2022-07-26 NOTE — Plan of Care (Signed)
  Problem: Education: Goal: Knowledge of General Education information will improve Description: Including pain rating scale, medication(s)/side effects and non-pharmacologic comfort measures Outcome: Progressing   Problem: Health Behavior/Discharge Planning: Goal: Ability to manage health-related needs will improve Outcome: Progressing   Problem: Clinical Measurements: Goal: Ability to maintain clinical measurements within normal limits will improve Outcome: Progressing Goal: Will remain free from infection Outcome: Progressing Goal: Diagnostic test results will improve Outcome: Progressing Goal: Respiratory complications will improve Outcome: Progressing Goal: Cardiovascular complication will be avoided Outcome: Progressing   Problem: Nutrition: Goal: Adequate nutrition will be maintained Outcome: Progressing   Problem: Coping: Goal: Level of anxiety will decrease Outcome: Progressing   Problem: Elimination: Goal: Will not experience complications related to bowel motility Outcome: Progressing Goal: Will not experience complications related to urinary retention Outcome: Progressing   Problem: Self-Concept: Goal: Will verbalize positive feelings about self Outcome: Progressing Goal: Level of anxiety will decrease Outcome: Progressing   Problem: Safety: Goal: Ability to disclose and discuss suicidal ideas will improve Outcome: Progressing Goal: Ability to identify and utilize support systems that promote safety will improve Outcome: Progressing   Problem: Role Relationship: Goal: Will demonstrate positive changes in social behaviors and relationships Outcome: Progressing

## 2022-07-26 NOTE — Progress Notes (Signed)
Physical Therapy Treatment Patient Details Name: Cathy [name] MRN: [id number] DOB: [birth year] Today's Date: 07/26/2022   History of Present Illness Pt is a 77 y.o. female presenting "voluntarily to [facility] reporting worsening depressive symptoms and suicidal ideation" 06/10/22; transferred to [facility] 06/14/22.  Unwitnessed fall 06/18/22 hitting head. Pt now with fall 07/01/22 and diagnosed with left inferior superior pubic ramus fracture and is WBAT per ortho consult note on 1/20.   PMH includes htn, vision changes, L TKA, lymphoma, depression, anxiety.    PT Comments    Pt was pleasant and motivated to participate during the session and put forth good effort throughout.  Pt steady during amb 250 feet with a RW including during sharp turns and start/stops with minimally antalgic gait pattern this session.  Pt required occasional min A to prevent posterior LOB during below balance training but grossly making progress towards goals.  Repeat Berg Balance Test to be administered next session.  Pt will benefit from PT services in a SNF setting upon discharge to safely address deficits listed in patient problem list for decreased caregiver assistance and eventual return to PLOF.     Recommendations for follow up therapy are one component of a multi-disciplinary discharge planning process, led by the attending physician.  Recommendations may be updated based on patient status, additional functional criteria and insurance authorization.  Follow Up Recommendations  Skilled nursing-short term rehab (<3 hours/day) Can patient physically be transported by private vehicle: Yes   Assistance Recommended at Discharge Frequent or constant Supervision/Assistance  Patient can return home with the following A little help with walking and/or transfers;A little help with bathing/dressing/bathroom;Assistance with cooking/housework;Direct supervision/assist for medications management;Direct supervision/assist for financial  management;Assist for transportation;Help with stairs or ramp for entrance   Equipment Recommendations  Rolling walker (2 wheels)    Recommendations for Other Services       Precautions / Restrictions Precautions Precautions: Fall Restrictions Weight Bearing Restrictions: Yes LLE Weight Bearing: Weight bearing as tolerated     Mobility  Bed Mobility               General bed mobility comments: NT, pt in recliner    Transfers Overall transfer level: Needs assistance Equipment used: Rolling walker (2 wheels) Transfers: Sit to/from Stand Sit to Stand: Supervision           General transfer comment: Good eccentric and concentric control and stability with sit to/from stand transfers from various height surfaces    Ambulation/Gait Ambulation/Gait assistance: Supervision Gait Distance (Feet): 250 Feet Assistive device: Rolling walker (2 wheels) Gait Pattern/deviations: Step-through pattern, Antalgic, Decreased stance time - left, Decreased step length - right Gait velocity: decreased     General Gait Details: Minimally reduced cadence and bilateral steps length but steady including during start/stops and sharp turns   Marine scientist Rankin (Stroke Patients Only)       Balance Overall balance assessment: Needs assistance Sitting-balance support: No upper extremity supported, Feet supported Sitting balance-Leahy Scale: Normal     Standing balance support: During functional activity, No upper extremity supported, Bilateral upper extremity supported, Reliant on assistive device for balance Standing balance-Leahy Scale: Fair                              Cognition Arousal/Alertness: Awake/alert Behavior During Therapy: WFL for tasks assessed/performed Overall Cognitive Status: Within  Functional Limits for tasks assessed                                          Exercises Other  Exercises Other Exercises: Static standing balance training with combinations of eye open/closed and head still/head turns with feet together and semi-tandem Other Exercises: Dynamic balance training without UE support with side stepping, forwards stepping, and backwards stepping with random direction changes and start/stops Other Exercises: Unsupported alternating toe taps 2 x 10 Other Exercises: Unsupported reaching outside BOS including picking up objects from the floor Reactive balance training inducing posterior instability     General Comments        Pertinent Vitals/Pain Pain Assessment Pain Assessment: 0-10 Pain Score: 2  Pain Location: L hip Pain Descriptors / Indicators: Sore Pain Intervention(s): Monitored during session    Home Living                          Prior Function            PT Goals (current goals can now be found in the care plan section) Progress towards PT goals: Progressing toward goals    Frequency    Min 2X/week      PT Plan Current plan remains appropriate    Co-evaluation              AM-PAC PT "6 Clicks" Mobility   Outcome Measure  Help needed turning from your back to your side while in a flat bed without using bedrails?: A Little Help needed moving from lying on your back to sitting on the side of a flat bed without using bedrails?: A Little Help needed moving to and from a bed to a chair (including a wheelchair)?: A Little Help needed standing up from a chair using your arms (e.g., wheelchair or bedside chair)?: A Little Help needed to walk in hospital room?: A Little Help needed climbing 3-5 steps with a railing? : A Little 6 Click Score: 18    End of Session Equipment Utilized During Treatment: Gait belt Activity Tolerance: Patient tolerated treatment well Patient left: in chair;with nursing/sitter in room Nurse Communication: Mobility status PT Visit Diagnosis: Unsteadiness on feet (R26.81);History of  falling (Z91.81);Other abnormalities of gait and mobility (R26.89);Muscle weakness (generalized) (M62.81);Pain Pain - Right/Left: Left Pain - part of body: Hip     Time: [id number] PT Time Calculation (min) (ACUTE ONLY): 24 min  Charges:  $Therapeutic Exercise: 23-37 mins                     D. Royetta Asal PT, DPT 07/26/22, 5:33 PM

## 2022-07-26 NOTE — BHH Group Notes (Signed)
[facility] Group Notes:  (Nursing/MHT/Case Management/Adjunct)  Date:  07/26/2022  Time:  2:58 PM  Type of Therapy:  Music Therapy  Participation Level:  Active  Participation Quality:  Appropriate  Affect:  Appropriate  Cognitive:  Appropriate  Insight:  Appropriate  Engagement in Group:  Engaged  Modes of Intervention:  Activity  Summary of Progress/Problems:  Cathy [name] 07/26/2022, 2:58 PM

## 2022-07-26 NOTE — BH Assessment (Signed)
1900 Received patient sitting in the dayroom visiting with her sitter. Patient appeared to be enjoying her visit. Will continue to monitor for safety  2000 Patient continues to be alert and oriented x 3. She denies having suicidal and homicidal  thoughts, visual and auditory hallucinations. Patient reports that she is having some anxiety about her discharge that is on tomorrow. She admits to being excited and hope that her new living situation goes well.   2150 Patient is medication compliant

## 2022-07-26 NOTE — Group Note (Signed)
[facility] LCSW Group Therapy Note   Group Date: 07/26/2022 Start Time: 1310 End Time: 1400   Type of Therapy/Topic:  Group Therapy:  Emotion Regulation  Participation Level:  Active   Mood:  Description of Group:    The purpose of this group is to assist patients in learning to regulate negative emotions and experience positive emotions. Patients will be guided to discuss ways in which they have been vulnerable to their negative emotions. These vulnerabilities will be juxtaposed with experiences of positive emotions or situations, and patients challenged to use positive emotions to combat negative ones. Special emphasis will be placed on coping with negative emotions in conflict situations, and patients will process healthy conflict resolution skills.  Therapeutic Goals: Patient will identify two positive emotions or experiences to reflect on in order to balance out negative emotions:  Patient will label two or more emotions that they find the most difficult to experience:  Patient will be able to demonstrate positive conflict resolution skills through discussion or role plays:   Summary of Patient Progress:   Patient was present for the entirety of the group session. Patient was an active listener and participated in the topic of discussion, provided helpful advice to others, and added nuance to topic of conversation. CSW led patient in meditation exercise to reduce anxiety and stress. She stated she felt very relaxed after the exercise but still felt worried and nothing was "fixed." She stated she is "upset with herself" because of her stay in the hospital "being unplanned" and is very worried about unplanned medical bills. She said that she was going to "do one payment at a time" but is concerned.     Therapeutic Modalities:   Cognitive Behavioral Therapy Feelings Identification Dialectical Behavioral Therapy   Noble Bodie A [place], LCSWA

## 2022-07-26 NOTE — Progress Notes (Signed)
1:1 Note  Patient compliant with medications denies SI/HI/A/VH and verbally contracted for safety Patient observed on 1:1 ambulating with front wheel walker all fall protocol in place. Patient remains safe. Support and encouragement provided.

## 2022-07-26 NOTE — Progress Notes (Signed)
   07/26/22 0738  Psych Admission Type (Psych Patients Only)  Admission Status Voluntary  Psychosocial Assessment  Patient Complaints None  Eye Contact Fair  Facial Expression Fixed smile  Affect Appropriate to circumstance  Speech Soft  Interaction Assertive  Motor Activity Unsteady;Slow  Appearance/Hygiene Unremarkable  Behavior Characteristics Cooperative;Calm  Mood Pleasant  Thought Process  Coherency WDL  Content WDL  Delusions None reported or observed  Perception WDL  Hallucination None reported or observed  Judgment WDL  Confusion WDL  Danger to Self  Current suicidal ideation? Denies  Danger to Others  Danger to Others None reported or observed

## 2022-07-26 NOTE — Progress Notes (Signed)
Cathy [name] [facility] MD Progress Note  07/26/2022 1:53 PM Cathy [name]  MRN:  [id number] Subjective: Cathy [name] is seen on rounds.  She is pleasant and cooperative.  She has been eating her breakfast and lunch.  She has been compliant with medications.  She has not been reacting to internal stimuli lately.  I think that her medications have been helpful.  She is very excited to go home.  Nurses report no issues. Principal Problem: Major depressive disorder, recurrent episode, severe with mood-congruent psychotic features ([place]) Diagnosis: Principal Problem:   Major depressive disorder, recurrent episode, severe with mood-congruent psychotic features ([place]) Active Problems:   Pelvic fracture ([place])   Inferior pubic ramus fracture, left, closed, initial encounter ([place])  Total Time spent with patient: 15 minutes  Past Psychiatric History: Long history of depression and anxiety.  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   Vision changes     Past Surgical History:  Procedure Laterality Date   CHOLECYSTECTOMY     IR IMAGING GUIDED PORT INSERTION  09/21/2020   LITHOTRIPSY  1984   LYMPH NODE BIOPSY     MASS BIOPSY Left 09/03/2020   Procedure: LEFT NECK JUGULAR NODE;  Surgeon: Rozetta Nunnery, MD;  Location: [facility];  Service: ENT;  Laterality: Left;   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   Procedure: LEFT TOTAL KNEE ARTHROPLASTY;  Surgeon: Gaynelle Arabian, MD;   Location: [facility];  Service: Orthopedics;  Laterality: Left;   Family History:  Family History  Problem Relation Age of Onset   Hypertension Mother    Stroke Mother    Heart attack Mother        CABG, 5 Stents   Dementia Mother    Cancer Father        Kidney   Asthma Sister    Diabetes Brother        Borderline   Alcohol abuse Brother    Cancer Maternal Aunt        stomach ca   Cancer Maternal Aunt        ovarian ca   Breast cancer Cousin    Colon cancer Neg Hx    Family Psychiatric  History: Unremarkable Social History:  Social History   Substance and Sexual Activity  Alcohol Use No     Social History   Substance and Sexual Activity  Drug Use No    Social History   Socioeconomic History   Marital status: Widowed    Spouse name: Not on file   Number of children: Not on file   Years of education: Not on file   Highest education level: Not on file  Occupational History   Not on file  Tobacco Use   Smoking status: Never   Smokeless tobacco: Never  Substance and Sexual Activity   Alcohol use: No   Drug use: No  Sexual activity: Not Currently    Birth control/protection: Post-menopausal  Other Topics Concern   Not on file  Social History Narrative   Lives alone.     Son is [name].     Social Determinants of Health   Financial Resource Strain: Not on file  Food Insecurity: No Food Insecurity (06/14/2022)   Hunger Vital Sign    Worried About Running Out of Food in the Last Year: Never true    Ran Out of Food in the Last Year: Never true  Transportation Needs: No Transportation Needs (06/14/2022)   PRAPARE - Hydrologist (Medical): No    Lack of Transportation (Non-Medical): No  Physical Activity: Not on file  Stress: Not on file  Social Connections: Not on file   Additional Social History:                         Sleep: Good  Appetite:  Fair  Current Medications: Current Facility-Administered  Medications  Medication Dose Route Frequency Provider Last Rate Last Admin   alum & mag hydroxide-simeth (MAALOX/MYLANTA) 200-200-20 MG/5ML suspension 30 mL  30 mL Oral Q4H PRN Parks Ranger, DO   30 mL at 07/24/22 1948   aspirin EC tablet 81 mg  81 mg Oral Daily Parks Ranger, DO   81 mg at 07/26/22 0853   atorvastatin (LIPITOR) tablet 20 mg  20 mg Oral Daily Parks Ranger, DO   20 mg at 07/26/22 [id number]   cholecalciferol (VITAMIN D3) 25 MCG (1000 UNIT) tablet 1,000 Units  1,000 Units Oral Daily Parks Ranger, DO   1,000 Units at 07/26/22 [id number]   cyanocobalamin (VITAMIN B12) tablet 1,000 mcg  1,000 mcg Oral Daily Parks Ranger, DO   1,000 mcg at 07/26/22 [id number]   feeding supplement (ENSURE ENLIVE / ENSURE PLUS) liquid 237 mL  237 mL Oral TID BM Parks Ranger, DO   237 mL at 07/21/22 1357   haloperidol (HALDOL) tablet 1 mg  1 mg Oral QPC supper Parks Ranger, DO   1 mg at 07/25/22 1654   ibuprofen (ADVIL) tablet 400 mg  400 mg Oral Q6H PRN [name], [name], MD   400 mg at 07/25/22 2107   lip balm (BLISTEX) ointment   Topical PRN Parks Ranger, DO       LORazepam (ATIVAN) tablet 0.5 mg  0.5 mg Oral Q6H PRN Parks Ranger, DO   0.5 mg at 07/19/22 0109   magnesium hydroxide (MILK OF MAGNESIA) suspension 30 mL  30 mL Oral Daily PRN Parks Ranger, DO   30 mL at 06/24/22 1006   mirtazapine (REMERON) tablet 7.5 mg  7.5 mg Oral QHS Parks Ranger, DO   7.5 mg at 07/25/22 2107   modafinil (PROVIGIL) tablet 200 mg  200 mg Oral Daily Parks Ranger, DO   200 mg at 07/26/22 [id number]   multivitamin with minerals tablet 1 tablet  1 tablet Oral Daily Parks Ranger, DO   1 tablet at 07/26/22 0853   OLANZapine (ZYPREXA) tablet 5 mg  5 mg Oral QHS Parks Ranger, DO   5 mg at 07/25/22 2107   oxyCODONE-acetaminophen (PERCOCET) 7.5-325 MG per tablet 1 tablet  1 tablet Oral Q6H PRN [name], [name], MD   1 tablet at 07/19/22 0008   pantoprazole (PROTONIX) EC tablet 40 mg  40 mg Oral Daily Parks Ranger, DO  40 mg at 07/26/22 0853   polyethylene glycol (MIRALAX / GLYCOLAX) packet 17 g  17 g Oral Daily Parks Ranger, DO   17 g at 07/26/22 [id number]   traZODone (DESYREL) tablet 25 mg  25 mg Oral QHS PRN Parks Ranger, DO       venlafaxine XR (EFFEXOR-XR) 24 hr capsule 300 mg  300 mg Oral Q breakfast Parks Ranger, DO   300 mg at 07/26/22 [id number]    Lab Results: No results found for this or any previous visit (from the past 48 hour(s)).  Blood Alcohol level:  Lab Results  Component Value Date   ETH <10 [id number]    Metabolic Disorder Labs: Lab Results  Component Value Date   HGBA1C 5.5 06/10/2022   MPG 111.15 06/10/2022   MPG 105 04/05/2021   No results found for: "PROLACTIN" Lab Results  Component Value Date   CHOL 246 (H) 06/10/2022   TRIG 94 06/10/2022   HDL 57 06/10/2022   CHOLHDL 4.3 06/10/2022   VLDL 19 06/10/2022   LDLCALC 170 (H) 06/10/2022   LDLCALC 67 04/05/2021    Physical Findings: AIMS:  , ,  ,  ,    CIWA:    COWS:     Musculoskeletal: Strength & Muscle Tone: within normal limits Gait & Station: unsteady, unable to stand Patient leans: N/A  Psychiatric Specialty Exam:  Presentation  General Appearance:  Appropriate for Environment; Casual  Eye Contact: Good  Speech: Clear and Coherent; Normal Rate  Speech Volume: Normal  Handedness: Right   Mood and Affect  Mood: Dysphoric  Affect: Congruent   Thought Process  Thought Processes: Coherent  Descriptions of Associations:Intact  Orientation:Full (Time, Place and Person)  Thought Content:Logical  History of Schizophrenia/Schizoaffective disorder:No  Duration of Psychotic Symptoms:No data recorded Hallucinations:No data recorded Ideas of Reference:None  Suicidal Thoughts:No data recorded Homicidal Thoughts:No data recorded  Sensorium   Memory: Immediate Good; Recent Good; Remote Good  Judgment: Good  Insight: Good   Executive Functions  Concentration: Good  Attention Span: Good  Recall: Good  Fund of Knowledge: Good  Language: Good   Psychomotor Activity  Psychomotor Activity:No data recorded  Assets  Assets: Physical Health; Resilience; Social Support; Catering manager; Housing   Sleep  Sleep:No data recorded   Physical Exam: Physical Exam Vitals and nursing note reviewed.  Constitutional:      Appearance: Normal appearance. She is normal weight.  Neurological:     General: No focal deficit present.     Mental Status: She is alert and oriented to person, place, and time.  Psychiatric:        Attention and Perception: Attention and perception normal.        Mood and Affect: Mood is depressed. Affect is flat.        Speech: Speech normal.        Behavior: Behavior normal. Behavior is cooperative.        Thought Content: Thought content normal.        Cognition and Memory: Cognition and memory normal.        Judgment: Judgment normal.    Review of Systems  Constitutional: Negative.   HENT: Negative.    Eyes: Negative.   Respiratory: Negative.    Cardiovascular: Negative.   Gastrointestinal: Negative.   Genitourinary: Negative.   Musculoskeletal: Negative.   Skin: Negative.   Neurological: Negative.   Endo/Heme/Allergies: Negative.   Psychiatric/Behavioral:  Positive for depression and memory loss.    Blood  pressure 123/69, pulse 88, temperature 98.9 F (37.2 C), temperature source Oral, resp. rate 18, height 5' 2"$  (1.575 m), SpO2 95 %. Body mass index is 20.01 kg/m.   Treatment Plan Summary: Daily contact with patient to assess and evaluate symptoms and progress in treatment, Medication management, and Plan continue current medications.  Parks Ranger, DO 07/26/2022, 1:53 PM

## 2022-07-26 NOTE — Group Note (Addendum)
Recreation Therapy Group Note   Group Topic:Health and Wellness  Group Date: 07/26/2022 Start Time: 1400 End Time: 1435 Facilitators: Vilma Prader, LRT, CTRS Location:  Dayroom  Group Description: Seated Exercise. LRT discussed the mental and physical benefits of exercise. LRT and group discussed how physical activity can be used as a coping skill. Pt's and LRT followed along to an exercise video on the TV screen that provided a visual representation and audio description of every exercise performed. Pt's encouraged to listen to their bodies and stop at any time if they experience feelings of discomfort or pain. LRT passed out water after session was over and encouraged pts do drink and stay hydrated.  Goal Area(s) Addressed: Ability to follow one-step directions Ability to follow multi-step directions Identification of a coping skill  Identification of a leisure interest  Affect/Mood: Appropriate and Flat   Participation Level: Active and Engaged   Participation Quality: Independent   Behavior: Alert and Appropriate   Speech/Thought Process: Coherent and Directed   Insight: Good   Judgement: Good   Modes of Intervention: Activity and Education   Patient Response to Interventions:  Engaged   Education Outcome:  Acknowledges education   Clinical Observations/Individualized Feedback: [name] was active in their participation of session activities and group discussion. Pt shared that the exercise felt "good" and that she was glad she came. Pt declined water after session, however LRT gave pt a cup. No pain noted.   Plan: Continue to engage patient in RT group sessions 2-3x/week.   Vilma Prader, LRT, CTRS 07/26/2022 2:58 PM

## 2022-07-26 NOTE — Group Note (Signed)
Date:  07/26/2022 Time:  9:58 PM  Group Topic/Focus:  Wrap-Up Group:   The focus of this group is to help patients review their daily goal of treatment and discuss progress on daily workbooks.    Participation Level:  Active  Participation Quality:  Attentive  Affect:  Excited  Cognitive:  Appropriate  Insight: Good  Engagement in Group:  Engaged  Modes of Intervention:  Discussion  Additional Comments:  Group Topic was "What are you thankful for?"  Neville Route 07/26/2022, 9:58 PM

## 2022-07-26 NOTE — Progress Notes (Signed)
Patient is alert and oriented times 3. Mood and affect sad/sullen. Patient denies pain. She denies SI, HI, and AVH. Also denies feelings of anxiety and depression at this time. States she slept good last night. Morning meds given whole by mouth W/O difficulty. Patient consumed meals in the dayroom- appetite fair. Patient remains on unit with Q15 minute checks and 1:1 sitter in place.

## 2022-07-27 ENCOUNTER — Telehealth: Payer: Self-pay | Admitting: Hematology and Oncology

## 2022-07-27 ENCOUNTER — Telehealth: Payer: Self-pay | Admitting: Family Medicine

## 2022-07-27 DIAGNOSIS — F332 Major depressive disorder, recurrent severe without psychotic features: Secondary | ICD-10-CM | POA: Diagnosis not present

## 2022-07-27 MED ORDER — LORAZEPAM 0.5 MG PO TABS: 0.5000 mg | ORAL_TABLET | Freq: Four times a day (QID) | ORAL | 0 refills | Status: DC | PRN

## 2022-07-27 MED ORDER — HALOPERIDOL 1 MG PO TABS: 1.0000 mg | ORAL_TABLET | Freq: Every day | ORAL | 3 refills | Status: AC

## 2022-07-27 MED ORDER — MODAFINIL 200 MG PO TABS: 200.0000 mg | ORAL_TABLET | Freq: Every day | ORAL | 3 refills | Status: DC

## 2022-07-27 MED ORDER — VENLAFAXINE HCL ER 150 MG PO CP24: 300.0000 mg | ORAL_CAPSULE | Freq: Every day | ORAL | 3 refills | Status: AC

## 2022-07-27 MED ORDER — MIRTAZAPINE 7.5 MG PO TABS: 7.5000 mg | ORAL_TABLET | Freq: Every day | ORAL | 3 refills | Status: AC

## 2022-07-27 MED ORDER — OLANZAPINE 5 MG PO TABS: 5.0000 mg | ORAL_TABLET | Freq: Every day | ORAL | 3 refills | Status: AC

## 2022-07-27 MED ORDER — OXYCODONE-ACETAMINOPHEN 7.5-325 MG PO TABS: 1.0000 | ORAL_TABLET | Freq: Four times a day (QID) | ORAL | 0 refills | Status: DC | PRN

## 2022-07-27 MED ORDER — ENSURE ENLIVE PO LIQD: 237.0000 mL | Freq: Three times a day (TID) | ORAL | 12 refills | Status: AC

## 2022-07-27 NOTE — Progress Notes (Signed)
1:1 Hourly Rounding  0730: Pt in dayroom calm and composed with sitter at side.  0830: Pt in dayroom calm and composed with sitter at side.  0930: Pt in dayroom calm and composed with sitter at side.  1030: Pt in dayroom calm and composed with sitter at side.  1130: Pt in dayroom calm and composed with sitter at side.  1230: Pt in dayroom calm and composed with sitter at side.  1252: Pt discharged to son. Staff took pt up to medical mall front entrance via wheelchair.

## 2022-07-27 NOTE — Telephone Encounter (Signed)
Called and talked to patient's son.  He is at the patient's house, with her.  She is doing well in the meantime since discharge.  I asked them to update me as needed.  I thank all involved.

## 2022-07-27 NOTE — Plan of Care (Signed)
Pt denies anxiety/depression at this time however, is anxious about not actually getting to discharge home today. Pt was provided with reassurance that she will be discharging home today with son. Pt denies SI/HI/AVH or pain at this time. Pt is calm and cooperative. Pt is medication compliant. Pt provided with support and encouragement. Pt monitored q15 minutes for safety per unit policy. Plan of care ongoing.   Problem: Activity: Goal: Risk for activity intolerance will decrease Outcome: Progressing   Problem: Nutrition: Goal: Adequate nutrition will be maintained Outcome: Progressing

## 2022-07-27 NOTE — BHH Suicide Risk Assessment (Signed)
[facility] Discharge Suicide Risk Assessment   Principal Problem: Major depressive disorder, recurrent episode, severe with mood-congruent psychotic features ([place]) Discharge Diagnoses: Principal Problem:   Major depressive disorder, recurrent episode, severe with mood-congruent psychotic features ([place]) Active Problems:   Pelvic fracture ([place])   Inferior pubic ramus fracture, left, closed, initial encounter ([place])   Total Time spent with patient: 1 hour  Musculoskeletal: Strength & Muscle Tone: within normal limits Gait & Station: unsteady Patient leans: N/A  Psychiatric Specialty Exam  Presentation  General Appearance:  Appropriate for Environment; Casual  Eye Contact: Good  Speech: Clear and Coherent; Normal Rate  Speech Volume: Normal  Handedness: Right   Mood and Affect  Mood: Dysphoric  Duration of Depression Symptoms: Greater than two weeks  Affect: Congruent   Thought Process  Thought Processes: Coherent  Descriptions of Associations:Intact  Orientation:Full (Time, Place and Person)  Thought Content:Logical  History of Schizophrenia/Schizoaffective disorder:No  Duration of Psychotic Symptoms:No data recorded Hallucinations:No data recorded Ideas of Reference:None  Suicidal Thoughts:No data recorded Homicidal Thoughts:No data recorded  Sensorium  Memory: Immediate Good; Recent Good; Remote Good  Judgment: Good  Insight: Good   Executive Functions  Concentration: Good  Attention Span: Good  Recall: Good  Fund of Knowledge: Good  Language: Good   Psychomotor Activity  Psychomotor Activity:No data recorded  Assets  Assets: Physical Health; Resilience; Social Support; Catering manager; Housing   Sleep  Sleep:No data recorded  Physical Exam: Physical Exam ROS Blood pressure 115/66, pulse 88, temperature 98 F (36.7 C), temperature source Oral, resp. rate 18, height 5' 2"$  (1.575 m), SpO2 99 %. Body mass  index is 20.01 kg/m.  Mental Status Per Nursing Assessment::   On Admission:  NA  Demographic Factors:  Age 20 or older and Caucasian  Loss Factors: Decline in physical health  Historical Factors: NA  Risk Reduction Factors:   Positive social support, Positive therapeutic relationship, and Positive coping skills or problem solving skills  Continued Clinical Symptoms:  Depression:   Anhedonia  Cognitive Features That Contribute To Risk:  Loss of executive function    Suicide Risk:  Minimal: No identifiable suicidal ideation.  Patients presenting with no risk factors but with morbid ruminations; may be classified as minimal risk based on the severity of the depressive symptoms   Follow-up [facility] Follow up on 08/08/2022.   Specialty: Behavioral Health Why: You have a follow up appointment scheduled for Monday, Februrary 26th at 10:30am. Please contact their office if needed to reschedule. Thanks! Contact information: [address] [place] [phone]                Plan Of Care/Follow-up recommendations: [place], DO 07/27/2022, 10:34 AM

## 2022-07-27 NOTE — Discharge Summary (Signed)
Physician Discharge Summary Note  Patient:  Cathy [name] is an 77 y.o., female MRN:  [id number] DOB:  1945-07-20 Patient phone:  [phone] (home)  Patient address:   [place] [id number],  Total Time spent with patient: 1 hour  Date of Admission:  06/14/2022 Date of Discharge: 07/27/2022  Reason for Admission:  [name] is a 77 year old white female who was involuntarily admitted to inpatient psychiatry for worsening depression and suicidal ideation.  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  She has been seeing her PCP who initially had her on Lexapro.  She did not feel like it was helping and eventually was changed to Trintellix.  She says that she developed bad thoughts and asked her neighbor for a firearm.  She says that she was joking but was having suicidal ideation.  She has never been psychiatrically hospitalized.  She has never seen a psychiatrist before.  She denies having depressed mood or suicide attempts in the past.  She was at the [facility] and started on Effexor XR which is at 150 mg/day.  She denies any side effects from it but states that she still feels depressed and anxious.  She describes her panic attacks as wanting to crawl out of her body.  She says that she is sleeping too much and she lacks motivation. She also has lost weight.  She currently lives in [place] alone.  She is a widow since 61.  She was a Education officer, museum for second and third grade.  She has a grown son that lives close who brought her to the emergency room.  Principal Problem: Major depressive disorder, recurrent episode, severe with mood-congruent psychotic features ([place]) Discharge Diagnoses: Principal Problem:   Major depressive disorder, recurrent episode, severe with mood-congruent psychotic features ([place]) Active Problems:   Pelvic fracture ([place])   Inferior pubic ramus fracture, left, closed, initial  encounter [facility])   Past Psychiatric History: Long history of depression and anxiety.  No past suicide attempts.  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   Vision changes     Past Surgical History:  Procedure Laterality Date   CHOLECYSTECTOMY     IR IMAGING GUIDED PORT INSERTION  09/21/2020   LITHOTRIPSY  1984   LYMPH NODE BIOPSY     MASS BIOPSY Left 09/03/2020   Procedure: LEFT NECK JUGULAR NODE;  Surgeon: Rozetta Nunnery, MD;  Location: [facility];  Service: ENT;  Laterality: Left;   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   Procedure: LEFT TOTAL KNEE ARTHROPLASTY;  Surgeon: Gaynelle Arabian, MD;  Location: [facility];  Service: Orthopedics;  Laterality: Left;   Family History:  Family History  Problem Relation Age of Onset   Hypertension Mother    Stroke Mother    Heart attack Mother  CABG, 5 Stents   Dementia Mother    Cancer Father        Kidney   Asthma Sister    Diabetes Brother        Borderline   Alcohol abuse Brother    Cancer Maternal Aunt        stomach ca   Cancer Maternal Aunt        ovarian ca   Breast cancer Cousin    Colon cancer Neg Hx    Family Psychiatric  History: Unremarkable Social History:  Social History   Substance and Sexual Activity  Alcohol Use No     Social History   Substance and Sexual Activity  Drug Use No    Social History   Socioeconomic History   Marital status: Widowed     Spouse name: Not on file   Number of children: Not on file   Years of education: Not on file   Highest education level: Not on file  Occupational History   Not on file  Tobacco Use   Smoking status: Never   Smokeless tobacco: Never  Substance and Sexual Activity   Alcohol use: No   Drug use: No   Sexual activity: Not Currently    Birth control/protection: Post-menopausal  Other Topics Concern   Not on file  Social History Narrative   Lives alone.     Son is [name].     Social Determinants of Health   Financial Resource Strain: Not on file  Food Insecurity: No Food Insecurity (06/14/2022)   Hunger Vital Sign    Worried About Running Out of Food in the Last Year: Never true    Ran Out of Food in the Last Year: Never true  Transportation Needs: No Transportation Needs (06/14/2022)   PRAPARE - Hydrologist (Medical): No    Lack of Transportation (Non-Medical): No  Physical Activity: Not on file  Stress: Not on file  Social Connections: Not on file    Hospital Course: [name] is a 78 year old white female who was voluntarily admitted to inpatient psychiatry for worsening depression and weight loss.  She currently lives alone but her son checks in on her and so does her sister.  She has become more despondent and not sleeping well or eating well.  While on the inpatient unit it was found that her blood pressure was low so we ended up discontinuing her blood pressure medications.  Her pressure normalized.  She was initiated on Atarax but that has made her more confused so we switched that to Ativan but sometimes it made her too sedated.  She had a protracted stay due to lack of her appetite and she had 3 falls for which she was placed on one-to-one.  She did break her pubic ramus and physical therapy worked with her.  Orthopedics said there was nothing for them to do.  Initially she was on Wellbutrin and Resporal that ended up being discontinued because she  did not respond.  ECT was discussed but she refused.  She was placed on Effexor XR which was titrated up during her hospitalization to 300 mg.  Provigil was started for daytime sleepiness and depression and that combination seemed to help.  Zyprexa 5 mg at bedtime and Remeron 7.5 mg at bedtime was initiated for sleep, delusions, psychosis at times, and appetite.  She did really well on this combination and it was continued.  She was pleasant and cooperative throughout her hospitalization.  She had episodes where she would respond to internal stimuli especially at night which milligram of Haldol was added.  She would have delusional thoughts about people selling her house or that her son did not like her etc.  Her appetite and sleep did improve but continues to need prompting.  She will require ongoing physical therapy.  Numerous referrals were made to skilled nursing facilities which were unable to take her.  Some said it was insurance, others said did not have a bed or they could not meet her needs.  Nonetheless, he definitely improved over time and her son arranged for home care.  Physical therapy will participate at home.  It was felt that she maximized hospitalization and she was discharged home.  On the day of discharge she denied suicidal ideation, homicidal ideation auditory or visual hallucinations.  Judgment and insight were good.  Her thought process had greatly improved and so did her mood.  Physical Findings: AIMS:  , ,  ,  ,    CIWA:    COWS:     Musculoskeletal: Strength & Muscle Tone: within normal limits Gait & Station: unsteady Patient leans: N/A   Psychiatric Specialty Exam:  Presentation  General Appearance:  Appropriate for Environment; Casual  Eye Contact: Good  Speech: Clear and Coherent; Normal Rate  Speech Volume: Normal  Handedness: Right   Mood and Affect  Mood: Dysphoric  Affect: Congruent   Thought Process  Thought  Processes: Coherent  Descriptions of Associations:Intact  Orientation:Full (Time, Place and Person)  Thought Content:Logical  History of Schizophrenia/Schizoaffective disorder:No  Duration of Psychotic Symptoms:No data recorded Hallucinations:No data recorded Ideas of Reference:None  Suicidal Thoughts:No data recorded Homicidal Thoughts:No data recorded  Sensorium  Memory: Immediate Good; Recent Good; Remote Good  Judgment: Good  Insight: Good   Executive Functions  Concentration: Good  Attention Span: Good  Recall: Good  Fund of Knowledge: Good  Language: Good   Psychomotor Activity  Psychomotor Activity:No data recorded  Assets  Assets: Physical Health; Resilience; Social Support; Catering manager; Housing   Sleep  Sleep:No data recorded   Physical Exam: Physical Exam Vitals and nursing note reviewed.  Constitutional:      Appearance: Normal appearance. She is normal weight.  Neurological:     General: No focal deficit present.     Mental Status: She is alert and oriented to person, place, and time.  Psychiatric:        Attention and Perception: Attention and perception normal.        Mood and Affect: Affect normal. Mood is depressed.        Speech: Speech normal.        Behavior: Behavior normal. Behavior is cooperative.        Thought Content: Thought content is delusional.        Cognition and Memory: Memory normal. Cognition is impaired.        Judgment: Judgment normal.    Review of Systems  Constitutional: Negative.   HENT: Negative.    Eyes: Negative.   Respiratory: Negative.    Cardiovascular: Negative.   Gastrointestinal: Negative.   Genitourinary: Negative.   Musculoskeletal: Negative.   Skin: Negative.   Neurological: Negative.   Endo/Heme/Allergies: Negative.   Psychiatric/Behavioral:  Positive for depression.    Blood pressure 115/66, pulse 88, temperature 98 F (36.7 C), temperature source Oral,  resp. rate 18, height 5' 2"$  (1.575 m), SpO2 99 %. Body mass index is 20.01 kg/m.   Social History  Tobacco Use  Smoking Status Never  Smokeless Tobacco Never   Tobacco Cessation:  N/A, patient does not currently use tobacco products   Blood Alcohol level:  Lab Results  Component Value Date   ETH <10 [id number]    Metabolic Disorder Labs:  Lab Results  Component Value Date   HGBA1C 5.5 06/10/2022   MPG 111.15 06/10/2022   MPG 105 04/05/2021   No results found for: "PROLACTIN" Lab Results  Component Value Date   CHOL 246 (H) 06/10/2022   TRIG 94 06/10/2022   HDL 57 06/10/2022   CHOLHDL 4.3 06/10/2022   VLDL 19 06/10/2022   LDLCALC 170 (H) 06/10/2022   [place] 67 04/05/2021    See Psychiatric Specialty Exam and Suicide Risk Assessment completed by Attending Physician prior to discharge.  Discharge destination:  Home  Is patient on multiple antipsychotic therapies at discharge:  No   Has Patient had three or more failed trials of antipsychotic monotherapy by history:  No  Recommended Plan for Multiple Antipsychotic Therapies: NA   Allergies as of 07/27/2022       Reactions   Codeine    Patients states when she was young, she was told she "acted weird" after being given codeine        Medication List     STOP taking these medications    hydrochlorothiazide 12.5 MG capsule Commonly known as: MICROZIDE   losartan 100 MG tablet Commonly known as: COZAAR   metoprolol succinate 50 MG 24 hr tablet Commonly known as: TOPROL-XL       TAKE these medications      Indication  Accu-Chek Aviva Plus test strip Generic drug: glucose blood Use to Monitor blood sugars daily. DX. E11.9    albuterol 108 (90 Base) MCG/ACT inhaler Commonly known as: VENTOLIN HFA Inhale 2 puffs into the lungs every 6 (six) hours as needed for wheezing or shortness of breath.    aspirin EC 81 MG tablet Take 81 mg by mouth daily.    atorvastatin 20 MG tablet Commonly known  as: LIPITOR Take 1 tablet (20 mg total) by mouth daily.    cetirizine 10 MG tablet Commonly known as: ZYRTEC Take 1 tablet (10 mg total) by mouth daily.    cyanocobalamin 1000 MCG tablet Commonly known as: VITAMIN B12 Take 1 tablet (1,000 mcg total) by mouth daily.    denosumab 60 MG/ML Sosy injection Commonly known as: PROLIA Inject 60 mg into the skin every 6 (six) months.    estradiol 0.1 MG/GM vaginal cream Commonly known as: ESTRACE Discard plastic applicator. Insert blueberry size amount of cream on finger in vagina daily x1 week then 2x per week.    feeding supplement Liqd Take 237 mLs by mouth 3 (three) times daily between meals.    fluticasone furoate-vilanterol 100-25 MCG/ACT Aepb Commonly known as: Breo Ellipta Inhale 1 puff into the lungs daily. What changed:  when to take this reasons to take this    haloperidol 1 MG tablet Commonly known as: HALDOL Take 1 tablet (1 mg total) by mouth daily after supper.  Indication: Agitated Movements Accompanied by Emotional Distress   hydroxypropyl methylcellulose / hypromellose 2.5 % ophthalmic solution Commonly known as: ISOPTO TEARS / GONIOVISC Place 2 drops into both eyes 3 (three) times daily as needed for dry eyes.    lidocaine-prilocaine cream Commonly known as: EMLA Apply to affected area once What changed:  how much to take when to take this reasons to take this additional instructions  LORazepam 0.5 MG tablet Commonly known as: ATIVAN Take 1 tablet (0.5 mg total) by mouth every 6 (six) hours as needed for anxiety.    mirtazapine 7.5 MG tablet Commonly known as: REMERON Take 1 tablet (7.5 mg total) by mouth at bedtime.  Indication: Major Depressive Disorder   modafinil 200 MG tablet Commonly known as: PROVIGIL Take 1 tablet (200 mg total) by mouth daily. Start taking on: July 28, 2022  Indication: Excessive Daytime Sleepiness, Major Depressive Disorder   multivitamin capsule Take 1 capsule  by mouth daily.    OLANZapine 5 MG tablet Commonly known as: ZYPREXA Take 1 tablet (5 mg total) by mouth at bedtime.  Indication: Anorexia Nervosa, Psychotic Depressive Illness   omeprazole 20 MG capsule Commonly known as: PRILOSEC Take 1 capsule (20 mg total) by mouth daily.    oxyCODONE-acetaminophen 7.5-325 MG tablet Commonly known as: PERCOCET Take 1 tablet by mouth every 6 (six) hours as needed for moderate pain.    venlafaxine XR 150 MG 24 hr capsule Commonly known as: Effexor XR Take 2 capsules (300 mg total) by mouth daily with breakfast. Start taking on: July 28, 2022 What changed:  medication strength how much to take  Indication: Generalized Anxiety Disorder, Major Depressive Disorder   vitamin D3 25 MCG (1000 UT) tablet Generic drug: Cholecalciferol Take 1 tablet (1,000 Units total) by mouth daily.                Durable Medical Equipment  (From admission, onward)           Start     Ordered   07/26/22 1421  For home use only DME Walker  Once       Question:  Patient needs a walker to treat with the following condition  Answer:  Fall   07/26/22 1420            Follow-up [facility] Follow up on 08/08/2022.   Specialty: Behavioral Health Why: You have a follow up appointment scheduled for Monday, Februrary 26th at 10:30am. Please contact their office if needed to reschedule. Thanks! Contact information: [place] [phone]                Follow-up recommendations:  [facility]    Signed: Parks Ranger, DO 07/27/2022, 10:44 AM

## 2022-07-27 NOTE — Progress Notes (Signed)
  [facility] Adult Case Management Discharge Plan :  Will you be returning to the same living situation after discharge:  Yes,  pt will be returning home At discharge, do you have transportation home?: Yes,  pt's son will be providing transportation Do you have the ability to pay for your medications: Yes,  pt has [facility]  Release of information consent forms completed and in the chart;  Patient's signature needed at discharge.  Patient to Follow up at:  Follow-up [facility] Follow up on 08/08/2022.   Specialty: Behavioral Health Why: You have a follow up appointment scheduled for Monday, Februrary 26th at 10:30am. Please contact their office if needed to reschedule. Thanks! Contact information: [address] [phone]                Next level of care provider has access to [place] and Suicide Prevention discussed: Yes,  SPE completed with pt's son     Has patient been referred to the Quitline?: N/A patient is not a smoker  Patient has been referred for addiction treatment: N/A  Cathy [name] A [place], [place] 07/27/2022, 9:24 AM

## 2022-07-27 NOTE — Progress Notes (Signed)
D: Pt alert and oriented. Pt denies experiencing any pain, SI/HI, or AVH at this time. Pt reports she will be able to keep herself safe when she returns home. Pt has completed a suicide safety plan and was given a survey to fill out. Discharge paperwork to include AVS and Transition Record was reviewed and given to the pt as well. Pt was also asked if they wish to receive a follow up call from the manager to which the patient replied no.  A: Pt received discharge and medication education/information. Pt belongings were returned and signed for at this time. Pt also received a front wheel walker for home use upon discharge.   R: Pt verbalized understanding of discharge and medication education/information.  Pt escorted via wheelchair by staff to [facility] front lobby where pt's son picked her up.

## 2022-07-27 NOTE — Telephone Encounter (Signed)
Spoke with patient confirming appointment change

## 2022-07-28 ENCOUNTER — Telehealth: Payer: Self-pay | Admitting: *Deleted

## 2022-07-28 DIAGNOSIS — E785 Hyperlipidemia, unspecified: Secondary | ICD-10-CM | POA: Diagnosis not present

## 2022-07-28 DIAGNOSIS — F419 Anxiety disorder, unspecified: Secondary | ICD-10-CM | POA: Diagnosis not present

## 2022-07-28 DIAGNOSIS — F332 Major depressive disorder, recurrent severe without psychotic features: Secondary | ICD-10-CM | POA: Diagnosis not present

## 2022-07-28 DIAGNOSIS — I10 Essential (primary) hypertension: Secondary | ICD-10-CM | POA: Diagnosis not present

## 2022-07-28 DIAGNOSIS — S32592D Other specified fracture of left pubis, subsequent encounter for fracture with routine healing: Secondary | ICD-10-CM | POA: Diagnosis not present

## 2022-07-28 DIAGNOSIS — M8580 Other specified disorders of bone density and structure, unspecified site: Secondary | ICD-10-CM | POA: Diagnosis not present

## 2022-07-28 DIAGNOSIS — J45909 Unspecified asthma, uncomplicated: Secondary | ICD-10-CM | POA: Diagnosis not present

## 2022-07-28 DIAGNOSIS — M4317 Spondylolisthesis, lumbosacral region: Secondary | ICD-10-CM | POA: Diagnosis not present

## 2022-07-28 DIAGNOSIS — K219 Gastro-esophageal reflux disease without esophagitis: Secondary | ICD-10-CM | POA: Diagnosis not present

## 2022-07-28 NOTE — Transitions of Care (Post Inpatient/ED Visit) (Signed)
   07/28/2022  Name: Cathy [name] MRN: [id number] DOB: 1945/08/31  Today's TOC FU Call Status: Today's TOC FU Call Status:: Successful TOC FU Call Competed TOC FU Call Complete Date: 07/28/22  Transition Care Management Follow-up Telephone Call Date of Discharge: 05/27/23 Discharge Facility: [facility] Type of Discharge: Inpatient Admission Primary Inpatient Discharge Diagnosis:: Depression How have you been since you were released from the hospital?: Better Any questions or concerns?: No  Items Reviewed: Did you receive and understand the discharge instructions provided?: Yes Medications obtained and verified?: Yes (Medications Reviewed) Any new allergies since your discharge?: No Dietary orders reviewed?: No Do you have support at home?: Yes People in Home: child(ren), adult Name of Support/Comfort Primary Source: [facility] and Equipment/Supplies: [place] Ordered?: No Any new equipment or medical supplies ordered?: Yes (walker) Name of Medical supply agency?: [employer] Were you able to get the equipment/medical supplies?: Yes Do you have any questions related to the use of the equipment/supplies?: No  Functional Questionnaire: Do you need assistance with bathing/showering or dressing?: No Do you need assistance with meal preparation?: No Do you need assistance with eating?: No Do you have difficulty maintaining continence: No Do you need assistance with getting out of bed/getting out of a chair/moving?: No Do you have difficulty managing or taking your medications?: No  Folllow up appointments reviewed: PCP Follow-up appointment confirmed?: NA MD Provider Line Number:[phone] Given: No Specialist Hospital Follow-up appointment confirmed?: Yes Date of Specialist follow-up appointment?: 08/08/22 Follow-Up Specialty Provider:: [facility]) Do you need transportation to your follow-up  appointment?: No Do you understand care options if your condition(s) worsen?: Yes-patient verbalized understanding  SDOH Interventions Today    Flowsheet Row Most Recent Value  SDOH Interventions   Food Insecurity Interventions Intervention Not Indicated  Housing Interventions Intervention Not Indicated  Transportation Interventions Intervention Not Indicated      Interventions Today    Flowsheet Row Most Recent Value  General Interventions   General Interventions Discussed/Reviewed General Interventions Discussed  [RN discussed the follow up appointment with Behavorial health]       [facility] [phone]

## 2022-07-28 NOTE — Care Management Important Message (Signed)
Important Message  Patient Details  Name: Cathy [name] MRN: [id number] Date of Birth: 07-23-1945   Medicare Important Message Given:  Yes     Colbie Sliker A [place], [place] 07/28/2022, 9:02 AM

## 2022-08-02 NOTE — Progress Notes (Signed)
Psychiatric Initial Adult Assessment   Patient Identification: Cathy [name] MRN:  [id number] Date of Evaluation:  08/08/2022 Referral Source: Tonia Ghent, MD  Chief Complaint:   Chief Complaint  Patient presents with   Establish Care   Visit Diagnosis:    ICD-10-CM   1. Major depressive disorder, single episode, moderate (HCC)  F32.1 EKG 12-Lead      History of Present Illness:   Cathy [name] is a 77 y.o. year old female with a history of depression, hodgkin lymphoma (diagnosed 09/2020) s/p chemotherapy, who is referred for after care after admission to [facility].   Per chart review, she was admitted to [facility]  1-07/2022 for severe depression.  "77 year old white female who was involuntarily admitted to inpatient psychiatry for worsening depression and suicidal ideation.  Cathy [name] was treated for lymphoma in the summer 2022 and developed depression and anxiety shortly after.  She has been seeing her PCP who initially had her on Lexapro."    She states that she was admitted to [facility], and this is a follow up visit.  She was feeling depressed last year.  She did not want to be around.  She talked with her son about her wish that she can just go to sleep.  Although she did not mean (SI,) she states that she may have (SI) that particular time.  She also states that she was asking a neighbor, who is a police how she can get a gun.  These are her out of her normal behavior.  She states that the stay was terrible, although she thinks it helped to adjust her medication.  She did not the length of time she stayed there as there was not much activity going on.  She thinks her mood has been better since discharge.  She does not feel depressed every day as she used to.  She cannot think of the reason she has been struggling with depression and anxiety, stating that she should not be.  She does not know where it is coming, stating that she should be grateful.  She denies any history of depression or  anxiety, stating that she is always happy and lucky.  Although she misses her husband, she does not think it is the reason.     Family-she reports good support from her sister, and her son who she talks every day.  She also has friends, who she goes out every day.   Physical-she underwent chemotherapy for Hodgkin's lymphoma.  She states that it was not as bad as she expected. She felt lucky that she got never sick through the process. She feels good to be cancer free, and denies any concern about relapse.  She denies any significant side effects when she underwent chemotherapy.  Even when she was diagnosed with Hodgkin's lymphoma, it did not bring acute distress as she thought somebody has something at certain point.   Depression- The patient has mood symptoms as in PHQ-9/GAD-7.  She sleeps up to 10 hours, and feeling refreshed.  She has low energy, anhedonia.  Although she used to enjoy going to [employer], or doing decoration for holidays, she is not interested in these.  She tends to be bothered by anything such as water filtration.  Although she used to have hallucinations and paranoia when she was on the unit, she denies any since discharge.    Her sister presents to the visit.  She states that [name] has been doing better.  She wonders if there is  any gut-mind interaction as Cathy [name] never had experienced this sever anxiety, although she has some anxiety at the baseline. She is giving, general person at her baseline. She thinks her mood has worsened a few weeks after completion of chemotherapy in Oct 2022.    Medication- venlafaxine 300 mg daily, mirtazapine 7.5 mg at night, modafinil 200 mg daily, olanzapine 5 mg at night, haloperidol 1 mg daily, lorazepam 0.5 mg q6hprn for anxiety (she takes up to bid prn)   Functional Status Instrumental Activities of Daily Living (IADLs):  Cathy [name] is independent in the following: managing finances, medications (in a pill box), cooking Requires  assistance with the following: driving (since admission)  Activities of Daily Living (ADLs):  Cathy [name] is independent in the following: bathing and hygiene, feeding, continence, grooming and toileting, walking    Support: sister, son Household: by herself Marital status:widow Number of children: 1 son, 2 grandchildren Employment: retired, [employer]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, since 2005 Education:   Last PCP / ongoing medical evaluation:     Wt Readings from Last 3 Encounters:  08/08/22 117 lb (53.1 kg)  05/24/22 109 lb 6.4 oz (49.6 kg)  04/08/22 111 lb (50.3 kg)     Associated Signs/Symptoms: Depression Symptoms:  depressed mood, anhedonia, insomnia, fatigue, (Hypo) Manic Symptoms:   denies decreased need for sleep, euphoria Anxiety Symptoms:   occasional panic attacks Psychotic Symptoms:   denies AH, VH, paranoia PTSD Symptoms: NA  Past Psychiatric History:  Outpatient:  Psychiatry admission:  Previous suicide attempt: denies  Past trials of medication: lexapro (did not like) History of violence:  History of head injury:   Previous Psychotropic Medications: Yes   Substance Abuse History in the last 12 months:  No.  Consequences of Substance Abuse: NA  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   Vision changes     Past Surgical History:  Procedure Laterality Date   CHOLECYSTECTOMY     IR IMAGING GUIDED PORT INSERTION   09/21/2020   LITHOTRIPSY  1984   LYMPH NODE BIOPSY     MASS BIOPSY Left 09/03/2020   Procedure: LEFT NECK JUGULAR NODE;  Surgeon: Rozetta Nunnery, MD;  Location: [facility];  Service: ENT;  Laterality: Left;   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   Procedure: LEFT TOTAL KNEE ARTHROPLASTY;  Surgeon: Gaynelle Arabian, MD;  Location: [facility];  Service: Orthopedics;  Laterality: Left;    Family Psychiatric History: as below  Family History:  Family History  Problem Relation Age of Onset   Hypertension Mother    Stroke Mother    Heart attack Mother        CABG, 5 Stents   Dementia Mother    Cancer Father        Kidney   Asthma Sister    Diabetes Brother  Borderline   Alcohol abuse Brother    Cancer Maternal Aunt        stomach ca   Cancer Maternal Aunt        ovarian ca   Breast cancer Cousin    Colon cancer Neg Hx     Social History:   Social History   Socioeconomic History   Marital status: Widowed    Spouse name: Not on file   Number of children: 1   Years of education: Not on file   Highest education level: Some college, no degree  Occupational History   Not on file  Tobacco Use   Smoking status: Never   Smokeless tobacco: Never  Substance and Sexual Activity   Alcohol use: No   Drug use: No   Sexual activity: Not Currently    Birth control/protection: Post-menopausal    Comment: not asked if sexually active  Other Topics Concern   Not on file  Social History Narrative   Lives alone.     Son is [name].     Social Determinants of Health   Financial Resource Strain: Not on file  Food Insecurity: No Food Insecurity (07/28/2022)   Hunger Vital Sign    Worried About Running Out of Food in the Last Year: Never true    Ran Out of Food in the Last Year: Never true  Transportation Needs: No Transportation Needs (07/28/2022)   PRAPARE - Hydrologist (Medical): No    Lack of Transportation  (Non-Medical): No  Physical Activity: Not on file  Stress: Not on file  Social Connections: Not on file    Additional Social History: as above  Allergies:   Allergies  Allergen Reactions   Codeine     Patients states when she was young, she was told she "acted weird" after being given codeine    Metabolic Disorder Labs: Lab Results  Component Value Date   HGBA1C 5.5 06/10/2022   MPG 111.15 06/10/2022   MPG 105 04/05/2021   No results found for: "PROLACTIN" Lab Results  Component Value Date   CHOL 246 (H) 06/10/2022   TRIG 94 06/10/2022   HDL 57 06/10/2022   CHOLHDL 4.3 06/10/2022   VLDL 19 06/10/2022   LDLCALC 170 (H) 06/10/2022   LDLCALC 67 04/05/2021   Lab Results  Component Value Date   TSH 1.797 06/10/2022    Therapeutic Level Labs: No results found for: "LITHIUM" No results found for: "CBMZ" No results found for: "VALPROATE"  Current Medications: Current Outpatient Medications  Medication Sig Dispense Refill   albuterol (VENTOLIN HFA) 108 (90 Base) MCG/ACT inhaler Inhale 2 puffs into the lungs every 6 (six) hours as needed for wheezing or shortness of breath. 8 g 2   aspirin EC 81 MG tablet Take 81 mg by mouth daily.     atorvastatin (LIPITOR) 20 MG tablet Take 1 tablet (20 mg total) by mouth daily. 90 tablet 1   cetirizine (ZYRTEC) 10 MG tablet Take 1 tablet (10 mg total) by mouth daily. 90 tablet 1   cholecalciferol (CHOLECALCIFEROL) 25 MCG tablet Take 1 tablet (1,000 Units total) by mouth daily.     cyanocobalamin (VITAMIN B12) 1000 MCG tablet Take 1 tablet (1,000 mcg total) by mouth daily.     denosumab (PROLIA) 60 MG/ML SOSY injection Inject 60 mg into the skin every 6 (six) months.     estradiol (ESTRACE) 0.1 MG/GM vaginal cream Discard plastic applicator. Insert blueberry size amount of  cream on finger in vagina daily x1 week then 2x per week.     feeding supplement (ENSURE ENLIVE / ENSURE PLUS) LIQD Take 237 mLs by mouth 3 (three) times daily between  meals. 237 mL 12   fluticasone furoate-vilanterol (BREO ELLIPTA) 100-25 MCG/ACT AEPB Inhale 1 puff into the lungs daily. (Patient taking differently: Inhale 1 puff into the lungs daily as needed.) 60 each 5   glucose blood (ACCU-CHEK AVIVA PLUS) test strip Use to Monitor blood sugars daily. DX. E11.9 100 each 5   haloperidol (HALDOL) 1 MG tablet Take 1 tablet (1 mg total) by mouth daily after supper. 30 tablet 3   hydroxypropyl methylcellulose / hypromellose (ISOPTO TEARS / GONIOVISC) 2.5 % ophthalmic solution Place 2 drops into both eyes 3 (three) times daily as needed for dry eyes.     lidocaine-prilocaine (EMLA) cream Apply to affected area once (Patient taking differently: 1 Application as needed. Apply to affected area to port as needed) 30 g 3   LORazepam (ATIVAN) 0.5 MG tablet Take 1 tablet (0.5 mg total) by mouth every 6 (six) hours as needed for anxiety. 30 tablet 0   mirtazapine (REMERON) 7.5 MG tablet Take 1 tablet (7.5 mg total) by mouth at bedtime. 30 tablet 3   modafinil (PROVIGIL) 200 MG tablet Take 1 tablet (200 mg total) by mouth daily. 30 tablet 3   Multiple Vitamin (MULTIVITAMIN) capsule Take 1 capsule by mouth daily.     OLANZapine (ZYPREXA) 5 MG tablet Take 1 tablet (5 mg total) by mouth at bedtime. 30 tablet 3   omeprazole (PRILOSEC) 20 MG capsule Take 1 capsule (20 mg total) by mouth daily. 90 capsule 1   venlafaxine XR (EFFEXOR XR) 150 MG 24 hr capsule Take 2 capsules (300 mg total) by mouth daily with breakfast. 60 capsule 3   No current facility-administered medications for this visit.    Musculoskeletal: Strength & Muscle Tone: within normal limits Gait & Station: normal Patient leans: N/A  Psychiatric Specialty Exam: Review of Systems  Blood pressure 126/73, pulse 88, temperature 97.9 F (36.6 C), temperature source Skin, height '5\' 2"'$  (1.575 m), weight 117 lb (53.1 kg).Body mass index is 21.4 kg/m.  General Appearance: Fairly Groomed  Eye Contact:  Good   Speech:  Clear and Coherent  Volume:  Normal  Mood:   better  Affect:  Appropriate, Congruent, and smile  Thought Process:  Coherent  Orientation:  Full (Time, Place, and Person)  Thought Content:  Logical  Suicidal Thoughts:  No  Homicidal Thoughts:  No  Memory:  Immediate;   Good  Judgement:  Good  Insight:  Good  Psychomotor Activity:  Normal, Normal tone, no rigidity, no resting/postural tremors, no tardive dyskinesia    Concentration:  Concentration: Good and Attention Span: Good  Recall:  Good  Fund of Knowledge:Good  Language: Good  Akathisia:  No  Handed:  Right  AIMS (if indicated):  0   Assets:  Communication Skills Desire for Improvement  ADL's:  Intact  Cognition: WNL  Sleep:  Good   Screenings: AUDIT    Flowsheet Row Admission (Discharged) from 06/14/2022 in [place]  Alcohol Use Disorder Identification Test Final Score (AUDIT) 0      GAD-7    Flowsheet Row Office Visit from 08/08/2022 in [place]  Total GAD-7 Score 6      PHQ2-9    [place] Office Visit from 08/08/2022 in [place]  Office Visit from 08/10/2020 in [place] Office Visit from 01/13/2020 in [place] Office Visit from 07/16/2019 in [place] Office Visit from 04/08/2019 in [place]  PHQ-2 Total Score 4 0 0 0 0  PHQ-9 Total Score 8 -- -- -- --      [place] Office Visit from 08/08/2022 in [place] Admission (Discharged) from 06/14/2022 in [place] ED from 06/10/2022 in [place] Error: Q3, 4, or 5 should not be populated when Q2 is No High Risk No Risk       Assessment and Plan:  Cathy [name] is a 77 y.o. year old female with a history of depression, hodgkin lymphoma  (diagnosed 09/2020) s/p chemotherapy, who is referred for after care after following admission to [facility].   1. Major depressive disorder, single episode, moderate (HCC) Acute stressors include:  Other stressors include: loss of her husband in 1995 from MI   History: no history of depression/anxiety until 2022,when there is worsening after completion of chemotherapy.  She was admitted to [facility] in Jan-Feb 2024 for depression, severe (with psychotic features.)  COVID in 2022, no long Covid Exam is notable for calm, pleasant demeanor, and she reports overall improvement in her mood symptoms since discharge.  She has good support from her sister, and her son.  She cannot identify any stressors proceeding to depression and denies any psychiatric history.  She does not have any significant cognitive impairment to be concerned this as neuropsychiatric symptoms.  Will continue to assess.  Will continue current medication regimen given she is recently discharged.  Will continue venlafaxine for depression.  She is informed that she is on the higher dose of venlafaxine than recommended dose.  Will continue mirtazapine to target depression and appetite loss.  Will continue modafinil at this time; this medication may be discontinued in the future due to its limited evidence.  Will continue olanzapine for hallucinations, and as adjunctive treatment for depression.  Discussed potential metabolic side effect, EPS, and QTc prolongation.  Although haloperidol will be continued at this time, both the patient and her sister are notified that we will try to taper off this medication in the future to avoid polypharmacy.  She is advised to take lorazepam for extreme anxiety only; the hope is to taper off this medication to avoid long-term side effect and minimize risk of falls.  Plan Continue venlafaxine 300 mg daily  Continue mirtazapine 7.5 mg at night Continue Modafinil 200 mg daily  Continue olanzapine 5 mg at night  Continue  haloperidol 1 mg daily  Obtain EKG Continue lorazepam 0.5 mg twice a day as needed for anxiety (she declined a refill this time) Next appointment: 4/23 at 1 pm for 30 mins IP  The patient demonstrates the following risk factors for suicide: Chronic risk factors for suicide include: psychiatric disorder of depression . Acute risk factors for suicide include: recent discharge from inpatient psychiatry. Protective factors for this patient include: positive social support, coping skills, and hope for the future. Considering these factors, the overall suicide risk at this point appears to be low. Patient is appropriate for outpatient follow up.   Collaboration of Care: Other reviewed notes in Epic  The duration of the time spent on the following activities on the date of the encounter was 50 minutes.   Preparing to see the patient (e.g., review of test,  records)  Obtaining and/or reviewing separately obtained history  Performing a medically necessary exam and/or evaluation  Counseling and educating the patient/family/caregiver  Ordering medications, tests, or procedures  Referring and communicating with other healthcare professionals (when not reported separately)  Documenting clinical information in the electronic or paper health record  Independently interpreting results of tests/labs and communication of results to the family or caregiver  Care coordination (when not reported separately)   Patient/Guardian was advised Release of Information must be obtained prior to any record release in order to collaborate their care with an outside provider. Patient/Guardian was advised if they have not already done so to contact the registration department to sign all necessary forms in order for [place] to release information regarding their care.   Consent: Patient/Guardian gives verbal consent for treatment and assignment of benefits for services provided during this visit. Patient/Guardian expressed  understanding and agreed to proceed.   Norman Clay, MD 2/26/202412:16 PM

## 2022-08-03 DIAGNOSIS — S32592D Other specified fracture of left pubis, subsequent encounter for fracture with routine healing: Secondary | ICD-10-CM | POA: Diagnosis not present

## 2022-08-03 DIAGNOSIS — J45909 Unspecified asthma, uncomplicated: Secondary | ICD-10-CM | POA: Diagnosis not present

## 2022-08-03 DIAGNOSIS — F332 Major depressive disorder, recurrent severe without psychotic features: Secondary | ICD-10-CM | POA: Diagnosis not present

## 2022-08-03 DIAGNOSIS — E785 Hyperlipidemia, unspecified: Secondary | ICD-10-CM | POA: Diagnosis not present

## 2022-08-03 DIAGNOSIS — I10 Essential (primary) hypertension: Secondary | ICD-10-CM | POA: Diagnosis not present

## 2022-08-03 DIAGNOSIS — M8580 Other specified disorders of bone density and structure, unspecified site: Secondary | ICD-10-CM | POA: Diagnosis not present

## 2022-08-03 DIAGNOSIS — K219 Gastro-esophageal reflux disease without esophagitis: Secondary | ICD-10-CM | POA: Diagnosis not present

## 2022-08-03 DIAGNOSIS — M4317 Spondylolisthesis, lumbosacral region: Secondary | ICD-10-CM | POA: Diagnosis not present

## 2022-08-03 DIAGNOSIS — F419 Anxiety disorder, unspecified: Secondary | ICD-10-CM | POA: Diagnosis not present

## 2022-08-08 ENCOUNTER — Ambulatory Visit (INDEPENDENT_AMBULATORY_CARE_PROVIDER_SITE_OTHER): Payer: Medicare PPO | Admitting: Psychiatry

## 2022-08-08 ENCOUNTER — Encounter: Payer: Self-pay | Admitting: Psychiatry

## 2022-08-08 VITALS — BP 126/73 | HR 88 | Temp 97.9°F | Ht 62.0 in | Wt 117.0 lb

## 2022-08-08 DIAGNOSIS — F321 Major depressive disorder, single episode, moderate: Secondary | ICD-10-CM | POA: Diagnosis not present

## 2022-08-08 NOTE — Patient Instructions (Signed)
Continue venlafaxine 300 mg daily  Continue mirtazapine 7.5 mg at night Continue Modafinil 200 mg daily  Continue olanzapine 5 mg at night  Continue haloperidol 1 mg daily  Obtain EKG Continue lorazepam 0.5 mg twice a day as needed for anxiety Next appointment: 4/23 at 1 pm

## 2022-08-09 DIAGNOSIS — J45909 Unspecified asthma, uncomplicated: Secondary | ICD-10-CM | POA: Diagnosis not present

## 2022-08-09 DIAGNOSIS — K219 Gastro-esophageal reflux disease without esophagitis: Secondary | ICD-10-CM | POA: Diagnosis not present

## 2022-08-09 DIAGNOSIS — M4317 Spondylolisthesis, lumbosacral region: Secondary | ICD-10-CM | POA: Diagnosis not present

## 2022-08-09 DIAGNOSIS — F419 Anxiety disorder, unspecified: Secondary | ICD-10-CM | POA: Diagnosis not present

## 2022-08-09 DIAGNOSIS — F332 Major depressive disorder, recurrent severe without psychotic features: Secondary | ICD-10-CM | POA: Diagnosis not present

## 2022-08-09 DIAGNOSIS — S32592D Other specified fracture of left pubis, subsequent encounter for fracture with routine healing: Secondary | ICD-10-CM | POA: Diagnosis not present

## 2022-08-09 DIAGNOSIS — M8580 Other specified disorders of bone density and structure, unspecified site: Secondary | ICD-10-CM | POA: Diagnosis not present

## 2022-08-09 DIAGNOSIS — I10 Essential (primary) hypertension: Secondary | ICD-10-CM | POA: Diagnosis not present

## 2022-08-09 DIAGNOSIS — E785 Hyperlipidemia, unspecified: Secondary | ICD-10-CM | POA: Diagnosis not present

## 2022-08-17 DIAGNOSIS — M8580 Other specified disorders of bone density and structure, unspecified site: Secondary | ICD-10-CM | POA: Diagnosis not present

## 2022-08-17 DIAGNOSIS — S32592D Other specified fracture of left pubis, subsequent encounter for fracture with routine healing: Secondary | ICD-10-CM | POA: Diagnosis not present

## 2022-08-17 DIAGNOSIS — F419 Anxiety disorder, unspecified: Secondary | ICD-10-CM | POA: Diagnosis not present

## 2022-08-17 DIAGNOSIS — I10 Essential (primary) hypertension: Secondary | ICD-10-CM | POA: Diagnosis not present

## 2022-08-17 DIAGNOSIS — F332 Major depressive disorder, recurrent severe without psychotic features: Secondary | ICD-10-CM | POA: Diagnosis not present

## 2022-08-17 DIAGNOSIS — M4317 Spondylolisthesis, lumbosacral region: Secondary | ICD-10-CM | POA: Diagnosis not present

## 2022-08-17 DIAGNOSIS — J45909 Unspecified asthma, uncomplicated: Secondary | ICD-10-CM | POA: Diagnosis not present

## 2022-08-17 DIAGNOSIS — K219 Gastro-esophageal reflux disease without esophagitis: Secondary | ICD-10-CM | POA: Diagnosis not present

## 2022-08-17 DIAGNOSIS — E785 Hyperlipidemia, unspecified: Secondary | ICD-10-CM | POA: Diagnosis not present

## 2022-08-23 ENCOUNTER — Inpatient Hospital Stay: Payer: Medicare PPO

## 2022-08-23 ENCOUNTER — Inpatient Hospital Stay: Payer: Medicare PPO | Admitting: Hematology and Oncology

## 2022-08-24 DIAGNOSIS — S32592D Other specified fracture of left pubis, subsequent encounter for fracture with routine healing: Secondary | ICD-10-CM | POA: Diagnosis not present

## 2022-08-24 DIAGNOSIS — M8580 Other specified disorders of bone density and structure, unspecified site: Secondary | ICD-10-CM | POA: Diagnosis not present

## 2022-08-24 DIAGNOSIS — E785 Hyperlipidemia, unspecified: Secondary | ICD-10-CM | POA: Diagnosis not present

## 2022-08-24 DIAGNOSIS — F332 Major depressive disorder, recurrent severe without psychotic features: Secondary | ICD-10-CM | POA: Diagnosis not present

## 2022-08-24 DIAGNOSIS — F419 Anxiety disorder, unspecified: Secondary | ICD-10-CM | POA: Diagnosis not present

## 2022-08-24 DIAGNOSIS — J45909 Unspecified asthma, uncomplicated: Secondary | ICD-10-CM | POA: Diagnosis not present

## 2022-08-24 DIAGNOSIS — K219 Gastro-esophageal reflux disease without esophagitis: Secondary | ICD-10-CM | POA: Diagnosis not present

## 2022-08-24 DIAGNOSIS — M4317 Spondylolisthesis, lumbosacral region: Secondary | ICD-10-CM | POA: Diagnosis not present

## 2022-08-24 DIAGNOSIS — I10 Essential (primary) hypertension: Secondary | ICD-10-CM | POA: Diagnosis not present

## 2022-08-25 ENCOUNTER — Inpatient Hospital Stay: Payer: Medicare PPO | Attending: Hematology and Oncology

## 2022-08-25 ENCOUNTER — Telehealth: Payer: Self-pay

## 2022-08-25 ENCOUNTER — Inpatient Hospital Stay: Payer: Medicare PPO | Admitting: Hematology and Oncology

## 2022-08-25 ENCOUNTER — Other Ambulatory Visit: Payer: Self-pay

## 2022-08-25 ENCOUNTER — Encounter: Payer: Self-pay | Admitting: Hematology and Oncology

## 2022-08-25 VITALS — BP 156/71 | HR 67 | Temp 97.6°F | Resp 16 | Ht 62.0 in | Wt 111.6 lb

## 2022-08-25 DIAGNOSIS — C8118 Nodular sclerosis classical Hodgkin lymphoma, lymph nodes of multiple sites: Secondary | ICD-10-CM | POA: Diagnosis not present

## 2022-08-25 DIAGNOSIS — F324 Major depressive disorder, single episode, in partial remission: Secondary | ICD-10-CM

## 2022-08-25 DIAGNOSIS — R634 Abnormal weight loss: Secondary | ICD-10-CM | POA: Insufficient documentation

## 2022-08-25 LAB — CBC WITH DIFFERENTIAL/PLATELET
Abs Immature Granulocytes: 0.02 10*3/uL (ref 0.00–0.07)
Basophils Absolute: 0.1 10*3/uL (ref 0.0–0.1)
Basophils Relative: 1 %
Eosinophils Absolute: 0.1 10*3/uL (ref 0.0–0.5)
Eosinophils Relative: 1 %
HCT: 41.8 % (ref 36.0–46.0)
Hemoglobin: 13.8 g/dL (ref 12.0–15.0)
Immature Granulocytes: 0 %
Lymphocytes Relative: 18 %
Lymphs Abs: 1.4 10*3/uL (ref 0.7–4.0)
MCH: 30.2 pg (ref 26.0–34.0)
MCHC: 33 g/dL (ref 30.0–36.0)
MCV: 91.5 fL (ref 80.0–100.0)
Monocytes Absolute: 0.6 10*3/uL (ref 0.1–1.0)
Monocytes Relative: 9 %
Neutro Abs: 5.4 10*3/uL (ref 1.7–7.7)
Neutrophils Relative %: 71 %
Platelets: 249 10*3/uL (ref 150–400)
RBC: 4.57 MIL/uL (ref 3.87–5.11)
RDW: 13.3 % (ref 11.5–15.5)
WBC: 7.5 10*3/uL (ref 4.0–10.5)
nRBC: 0 % (ref 0.0–0.2)

## 2022-08-25 LAB — COMPREHENSIVE METABOLIC PANEL
ALT: 14 U/L (ref 0–44)
AST: 18 U/L (ref 15–41)
Albumin: 4.3 g/dL (ref 3.5–5.0)
Alkaline Phosphatase: 83 U/L (ref 38–126)
Anion gap: 6 (ref 5–15)
BUN: 15 mg/dL (ref 8–23)
CO2: 28 mmol/L (ref 22–32)
Calcium: 8.5 mg/dL — ABNORMAL LOW (ref 8.9–10.3)
Chloride: 106 mmol/L (ref 98–111)
Creatinine, Ser: 0.61 mg/dL (ref 0.44–1.00)
GFR, Estimated: >60 mL/min (ref >60)
Glucose, Bld: 114 mg/dL — ABNORMAL HIGH (ref 70–99)
Potassium: 3.9 mmol/L (ref 3.5–5.1)
Sodium: 140 mmol/L (ref 135–145)
Total Bilirubin: 0.4 mg/dL (ref 0.3–1.2)
Total Protein: 6.9 g/dL (ref 6.5–8.1)

## 2022-08-25 MED ORDER — HEPARIN SOD (PORK) LOCK FLUSH 100 UNIT/ML IV SOLN
500.0000 [IU] | Freq: Once | INTRAVENOUS | Status: AC
  Administered 2022-08-25: 500 [IU]

## 2022-08-25 MED ORDER — SODIUM CHLORIDE 0.9% FLUSH
10.0000 mL | Freq: Once | INTRAVENOUS | Status: AC
  Administered 2022-08-25: 10 mL

## 2022-08-25 NOTE — Telephone Encounter (Signed)
Returned her call and move appt to see Dr. [name] to 4/5 at 1:30 pm. She is aware of appt.

## 2022-08-25 NOTE — Assessment & Plan Note (Signed)
She has no palpable LN However, I am extremely concerned about her weight loss I recommend repeat CT to rule out recurrence and she is in agreement

## 2022-08-25 NOTE — Assessment & Plan Note (Signed)
Could be due to depression As above, I recommend CT I recommend the patient to take remeron consistently

## 2022-08-25 NOTE — Progress Notes (Signed)
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    Answer:   [facility]    Order Specific Question:   Radiology Contrast Protocol - do NOT remove file path    Answer:   [email or web address]    All questions were answered. The patient knows to call the clinic with any problems, questions or concerns. The total time spent in the appointment was 25 minutes encounter with patients including review of chart and various tests results, discussions about plan of care and coordination of care plan   Heath Lark, MD 08/25/2022 4:33 PM  INTERVAL HISTORY: Please see below for problem oriented charting. she returns for surveillance follow-up  She denies recent infection She has lost a lot of weight since last summer  REVIEW OF SYSTEMS:   Constitutional: Denies fevers, chills  Eyes: Denies blurriness of vision Ears, nose, mouth, throat, and face: Denies mucositis or sore throat Respiratory: Denies cough, dyspnea or wheezes Cardiovascular: Denies palpitation, chest discomfort or lower extremity swelling Gastrointestinal:  Denies nausea, heartburn or change in bowel habits Skin: Denies abnormal  skin rashes Lymphatics: Denies new lymphadenopathy or easy bruising Neurological:Denies numbness, tingling or new weaknesses Behavioral/Psych: Mood is stable, no new changes  All other systems were reviewed with the patient and are negative.  I have reviewed the past medical history, past surgical history, social history and family history with the patient and they are unchanged from previous note.  ALLERGIES:  is allergic to codeine.  MEDICATIONS:  Current Outpatient Medications  Medication Sig Dispense Refill   albuterol (VENTOLIN HFA) 108 (90 Base) MCG/ACT inhaler Inhale 2 puffs into the lungs every 6 (six) hours as needed for wheezing or shortness of breath. 8 g 2   aspirin EC 81 MG tablet Take 81 mg by mouth daily.     atorvastatin (LIPITOR) 20 MG tablet Take 1 tablet (20 mg total) by mouth daily. 90 tablet 1   cetirizine (ZYRTEC) 10 MG tablet Take 1 tablet (10 mg total) by mouth daily. 90 tablet 1   cholecalciferol (CHOLECALCIFEROL) 25 MCG tablet Take 1 tablet (1,000 Units total) by mouth daily.     cyanocobalamin (VITAMIN B12) 1000 MCG tablet Take 1 tablet (1,000 mcg total) by mouth daily.     denosumab (PROLIA) 60 MG/ML SOSY injection Inject 60 mg into the skin every 6 (six) months.     estradiol (ESTRACE) 0.1 MG/GM vaginal cream Discard plastic applicator. Insert blueberry size amount of cream on finger in vagina daily x1 week then 2x per week.     feeding supplement (ENSURE ENLIVE / ENSURE PLUS) LIQD Take 237 mLs by mouth 3 (three) times daily between meals. 237 mL 12   fluticasone furoate-vilanterol (BREO  ELLIPTA) 100-25 MCG/ACT AEPB Inhale 1 puff into the lungs daily. (Patient taking differently: Inhale 1 puff into the lungs daily as needed.) 60 each 5   glucose blood (ACCU-CHEK AVIVA PLUS) test strip Use to Monitor blood sugars daily. DX. E11.9 100 each 5   haloperidol (HALDOL) 1 MG tablet Take 1 tablet (1 mg total) by mouth daily after supper. 30 tablet 3   hydroxypropyl  methylcellulose / hypromellose (ISOPTO TEARS / GONIOVISC) 2.5 % ophthalmic solution Place 2 drops into both eyes 3 (three) times daily as needed for dry eyes.     lidocaine-prilocaine (EMLA) cream Apply to affected area once (Patient taking differently: 1 Application as needed. Apply to affected area to port as needed) 30 g 3   LORazepam (ATIVAN) 0.5 MG tablet Take 1 tablet (0.5 mg total) by mouth every 6 (six) hours as needed for anxiety. 30 tablet 0   mirtazapine (REMERON) 7.5 MG tablet Take 1 tablet (7.5 mg total) by mouth at bedtime. 30 tablet 3   modafinil (PROVIGIL) 200 MG tablet Take 1 tablet (200 mg total) by mouth daily. 30 tablet 3   Multiple Vitamin (MULTIVITAMIN) capsule Take 1 capsule by mouth daily.     OLANZapine (ZYPREXA) 5 MG tablet Take 1 tablet (5 mg total) by mouth at bedtime. 30 tablet 3   omeprazole (PRILOSEC) 20 MG capsule Take 1 capsule (20 mg total) by mouth daily. 90 capsule 1   venlafaxine XR (EFFEXOR XR) 150 MG 24 hr capsule Take 2 capsules (300 mg total) by mouth daily with breakfast. 60 capsule 3   No current facility-administered medications for this visit.    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 Extensive hypermetabolic lymphadenopathy involving the neck, chest and abdomen as detailed above (Deauville 5). I do not see any pelvic disease. 2. Possible involvement of the spleen. 3. Osseous involvement is also noted.     09/03/2020 Pathology Results   A. LYMPH NODE, LEFT  NECK, EXCISION:  -Lymphoproliferative disorder consistent with classical Hodgkin lymphoma -See comment   COMMENT:   The sections show effacement of the lymph nodal architecture by a primarily nodular lymphoproliferative process.  In the more cellular areas, the nodules are composed of a mixture of small lymphocytes, plasma cells, abundant histiocytes in addition to atypical large mononuclear and multi-lobated lymphoid appearing cells with variably prominent nucleoli.  The nodules are surrounded by dense collagenous fibrosis.  In the less cellular areas, there appears to be a fibroblastic proliferation and a much more depleted cellular appearance but scattering of large atypical lymphoid appearing cells is still seen.   Flow cytometric analysis was performed [facility] [phone]) and shows predominance of T lymphocytes with nonspecific changes including relative abundance of CD8 positive cells and reversal of the CD4: CD8 ratio.  No monoclonal B-cell population identified.  A battery of immunohistochemical stains was performed and shows that the large atypical lymphoid appearing cells are positive for CD30, Mum-1, PAX 5 (weak), CD23 in addition to variable but generally weak positivity for CD79a and CD20.  Only scattered large  atypical lymphoid cells show weak Golgi positivity for CD15.  The large atypical lymphoid cells are negative for LCA, CD10, CD3, CD5, EMA, ALK protein and EBV in situ  hybridization.  The small lymphoid cells in the background show a mixture of T and B cells with predominance of T cells.  T cells show a mixture of CD4 and CD8 positive cells with slight predominance of the latter.  The overall findings are most consistent with classical Hodgkin lymphoma, nodular sclerosis subtype.      09/21/2020 Procedure   Placement of a subcutaneous port device. Catheter tip at the superior cavoatrial junction.   09/25/2020 Echocardiogram   1. Left ventricular ejection fraction, by estimation, is 60 to  65%. The left ventricle has normal function. The left ventricle has no regional wall motion abnormalities. There is mild concentric left ventricular hypertrophy. Left ventricular diastolic parameters are consistent with Grade I diastolic dysfunction (impaired relaxation). The average left ventricular global longitudinal strain is -19.6 %. The global longitudinal strain is normal.  2. Right ventricular systolic function is normal. The right ventricular size is normal.  3. A small pericardial effusion is present. The pericardial effusion is surrounding the apex and anterior to the right ventricle. There is no evidence of cardiac tamponade.  4. The mitral valve is normal in structure. No evidence of mitral valve regurgitation.  5. The aortic valve is tricuspid. Aortic valve regurgitation is not visualized. Mild aortic valve sclerosis is present, with no evidence of aortic valve stenosis.  6. The inferior vena cava is normal in size with greater than 50% respiratory variability, suggesting right atrial pressure of 3 mmHg.    Chemotherapy    Patient is on Treatment Plan: HODGKINS LYMPHOMA  A + AVD Q28D       11/23/2020 PET scan   1. Interval significant response to therapy. The previously demonstrated hypermetabolic adenopathy in the neck, chest and abdomen has nearly completely resolved. Deauville 1 and 2. Slightly greater residual metabolic activity within hilar lymph nodes is less specific and may be reactive. 2. Resolution of previously demonstrated focal hypermetabolic activity in the spleen and bones. Diffuse bone marrow activity attributed to treatment response. 3. Stable incidental findings as detailed above.   01/11/2021 Echocardiogram    1. Stable EF and GLS compared to 09/25/20. Left ventricular ejection fraction, by estimation, is 60 to 65%. The left ventricle has normal function. The left ventricle has no regional wall motion abnormalities. Left ventricular diastolic parameters are consistent  with Grade I diastolic dysfunction (impaired relaxation). The average left ventricular global longitudinal strain is -19.7 %. The global longitudinal strain is normal.  2. Right ventricular systolic function is normal. The right ventricular size is normal.  3. Left atrial size was mildly dilated.  4. The mitral valve is abnormal. Trivial mitral valve regurgitation. No evidence of mitral stenosis. Moderate mitral annular calcification.  5. The aortic valve is calcified. There is moderate calcification of the aortic valve. Aortic valve regurgitation is not visualized. Mild to moderate aortic valve sclerosis/calcification is present, without any evidence of aortic stenosis.  6. The inferior vena cava is normal in size with greater than 50% respiratory variability, suggesting right atrial pressure of 3 mmHg.     04/01/2021 PET scan   Resolution of bilateral hilar activity and decreased size of LEFT retroperitoneal lymph node with stable SUV value less than mediastinal blood pool.   Interval development of pulmonary nodules with increased metabolic activity. Findings are nonspecific. Nodule in the RIGHT upper  lobe has a branching pattern that could be seen in the setting of infectious or inflammatory changes. This is considered due to relatively rapid development. There is however moderate, relatively higher uptake in the LEFT lower lobe raising the question of neoplasm including lymphoma.   Three-vessel coronary artery disease.   Aortic Atherosclerosis (ICD10-I70.0).     07/01/2021 Imaging   1. Resolution of mediastinal and hilar lymphadenopathy. Minimal residual matted soft tissue density consistent with treated disease. No findings suspicious for residual or recurrent lymphoma. 2. Small branching nodular lesion in the right upper lobe and subpleural nodularity at the left lung base are stable. Likely benign process. Attention on future scans is suggested. 3. No acute pulmonary findings.   Aortic  Atherosclerosis (ICD10-I70.0).     01/03/2022 Imaging   Stable 8 mm left para-aortic lymph node. No new or progressive disease.   Stable subpleural nodularity in the posterior left lower lobe, most likely benign. Recommend continued attention on follow-up imaging.   Stable 4.8 cm benign-appearing left adnexal cyst. Recommend continued attention on follow-up imaging.   Aortic Atherosclerosis (ICD10-I70.0).     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[place])   09/29/2020 - 03/02/2021 Chemotherapy   Patient is on Treatment Plan : HODGKINS LYMPHOMA  A + AVD q28d       PHYSICAL EXAMINATION: ECOG PERFORMANCE STATUS: 0 - Asymptomatic  Vitals:   08/25/22 0937  BP: (!) 156/71  Pulse: 67  Resp: 16  Temp: 97.6 F (36.4 C)  SpO2: 100%   Filed Weights   08/25/22 0937  Weight: 111 lb 9.6 oz (50.6 kg)    GENERAL:alert, no distress and comfortable SKIN: skin color, texture, turgor are normal, no rashes or significant lesions EYES: normal, Conjunctiva are pink and non-injected, sclera clear OROPHARYNX:no exudate, no erythema and lips, buccal mucosa, and tongue normal  NECK: supple, thyroid normal size, non-tender, without nodularity LYMPH:  no palpable lymphadenopathy in the cervical, axillary or inguinal LUNGS: clear to auscultation and percussion with normal breathing effort HEART: regular rate & rhythm and no murmurs and no lower extremity edema ABDOMEN:abdomen soft, non-tender and normal bowel sounds Musculoskeletal:no cyanosis of digits and no clubbing  NEURO: alert & oriented x 3 with fluent speech, no focal motor/sensory deficits  LABORATORY DATA:  I have reviewed the data as listed    Component Value Date/Time   NA 140 08/25/2022 0916   NA 143 08/15/2016 1037   K 3.9 08/25/2022 0916   K 4.8 08/15/2016 1037   CL 106 08/25/2022 0916   CL 103 07/16/2012 1304   CO2 28 08/25/2022 0916   CO2 27 08/15/2016  1037   GLUCOSE 114 (H) 08/25/2022 0916   GLUCOSE 111 08/15/2016 1037   GLUCOSE 112 (H) 07/16/2012 1304   BUN 15 08/25/2022 0916   BUN 12.7 08/15/2016 1037   CREATININE 0.61 08/25/2022 0916   CREATININE 0.60 05/24/2022 0858   CREATININE 0.95 (H) 08/10/2020 1041   CREATININE 0.9 08/15/2016 1037   CALCIUM 8.5 (L) 08/25/2022 0916   CALCIUM 10.3 08/13/2020 1117   CALCIUM 10.5 (H) 08/15/2016 1037   PROT 6.9 08/25/2022 0916   PROT 7.9 08/15/2016 1037   ALBUMIN 4.3 08/25/2022 0916   ALBUMIN 4.5 08/15/2016 1037   AST 18 08/25/2022 0916   AST 15 05/24/2022 0858   AST 30 08/15/2016 1037   ALT 14 08/25/2022 0916   ALT 8 05/24/2022 0858   ALT 29 08/15/2016 1037  ALKPHOS 83 08/25/2022 0916   ALKPHOS 67 08/15/2016 1037   BILITOT 0.4 08/25/2022 0916   BILITOT 0.4 05/24/2022 0858   BILITOT 0.80 08/15/2016 1037   GFRNONAA >60 08/25/2022 0916   GFRNONAA >60 05/24/2022 0858   GFRNONAA 62 12/05/2017 1000   GFRAA 72 12/05/2017 1000    No results found for: "SPEP", "UPEP"  Lab Results  Component Value Date   WBC 7.5 08/25/2022   NEUTROABS 5.4 08/25/2022   HGB 13.8 08/25/2022   HCT 41.8 08/25/2022   MCV 91.5 08/25/2022   PLT 249 08/25/2022      Chemistry      Component Value Date/Time   NA 140 08/25/2022 0916   NA 143 08/15/2016 1037   K 3.9 08/25/2022 0916   K 4.8 08/15/2016 1037   CL 106 08/25/2022 0916   CL 103 07/16/2012 1304   CO2 28 08/25/2022 0916   CO2 27 08/15/2016 1037   BUN 15 08/25/2022 0916   BUN 12.7 08/15/2016 1037   CREATININE 0.61 08/25/2022 0916   CREATININE 0.60 05/24/2022 0858   CREATININE 0.95 (H) 08/10/2020 1041   CREATININE 0.9 08/15/2016 1037      Component Value Date/Time   CALCIUM 8.5 (L) 08/25/2022 0916   CALCIUM 10.3 08/13/2020 1117   CALCIUM 10.5 (H) 08/15/2016 1037   ALKPHOS 83 08/25/2022 0916   ALKPHOS 67 08/15/2016 1037   AST 18 08/25/2022 0916   AST 15 05/24/2022 0858   AST 30 08/15/2016 1037   ALT 14 08/25/2022 0916   ALT 8  05/24/2022 0858   ALT 29 08/15/2016 1037   BILITOT 0.4 08/25/2022 0916   BILITOT 0.4 05/24/2022 0858   BILITOT 0.80 08/15/2016 1037

## 2022-09-02 ENCOUNTER — Ambulatory Visit: Payer: Medicare PPO | Admitting: Hematology and Oncology

## 2022-09-14 ENCOUNTER — Ambulatory Visit (HOSPITAL_COMMUNITY)
Admission: RE | Admit: 2022-09-14 | Discharge: 2022-09-14 | Disposition: A | Discharge status: Home / Self Care | Payer: Medicare PPO | Source: Ambulatory Visit | Attending: Hematology and Oncology | Admitting: Hematology and Oncology

## 2022-09-14 DIAGNOSIS — C8118 Nodular sclerosis classical Hodgkin lymphoma, lymph nodes of multiple sites: Secondary | ICD-10-CM

## 2022-09-14 DIAGNOSIS — J984 Other disorders of lung: Secondary | ICD-10-CM | POA: Diagnosis not present

## 2022-09-14 DIAGNOSIS — S32502A Unspecified fracture of left pubis, initial encounter for closed fracture: Secondary | ICD-10-CM | POA: Diagnosis not present

## 2022-09-14 MED ORDER — IOHEXOL 9 MG/ML PO SOLN
1000.0000 mL | ORAL | Status: AC
  Administered 2022-09-14: 1000 mL via ORAL

## 2022-09-14 MED ORDER — SODIUM CHLORIDE (PF) 0.9 % IJ SOLN
INTRAMUSCULAR | Status: AC
  Filled 2022-09-14: qty 50

## 2022-09-14 MED ORDER — HEPARIN SOD (PORK) LOCK FLUSH 100 UNIT/ML IV SOLN
500.0000 [IU] | Freq: Once | INTRAVENOUS | Status: AC
  Administered 2022-09-14: 500 [IU] via INTRAVENOUS

## 2022-09-14 MED ORDER — IOHEXOL 300 MG/ML  SOLN
75.0000 mL | Freq: Once | INTRAMUSCULAR | Status: AC | PRN
  Administered 2022-09-14: 75 mL via INTRAVENOUS

## 2022-09-14 MED ORDER — HEPARIN SOD (PORK) LOCK FLUSH 100 UNIT/ML IV SOLN
INTRAVENOUS | Status: AC
  Filled 2022-09-14: qty 5

## 2022-09-16 ENCOUNTER — Ambulatory Visit: Payer: Medicare PPO | Admitting: Hematology and Oncology

## 2022-09-16 ENCOUNTER — Other Ambulatory Visit: Payer: Self-pay

## 2022-09-16 ENCOUNTER — Inpatient Hospital Stay: Payer: Medicare PPO | Attending: Hematology and Oncology | Admitting: Hematology and Oncology

## 2022-09-16 VITALS — BP 128/72 | HR 90 | Temp 97.5°F | Resp 18 | Ht 62.0 in | Wt 111.8 lb

## 2022-09-16 DIAGNOSIS — R634 Abnormal weight loss: Secondary | ICD-10-CM | POA: Insufficient documentation

## 2022-09-16 DIAGNOSIS — I2510 Atherosclerotic heart disease of native coronary artery without angina pectoris: Secondary | ICD-10-CM | POA: Insufficient documentation

## 2022-09-16 DIAGNOSIS — Z7951 Long term (current) use of inhaled steroids: Secondary | ICD-10-CM | POA: Insufficient documentation

## 2022-09-16 DIAGNOSIS — Z79899 Other long term (current) drug therapy: Secondary | ICD-10-CM | POA: Insufficient documentation

## 2022-09-16 DIAGNOSIS — Z7982 Long term (current) use of aspirin: Secondary | ICD-10-CM | POA: Diagnosis not present

## 2022-09-16 DIAGNOSIS — I700 Atherosclerosis of aorta: Secondary | ICD-10-CM | POA: Diagnosis not present

## 2022-09-16 DIAGNOSIS — S32592A Other specified fracture of left pubis, initial encounter for closed fracture: Secondary | ICD-10-CM | POA: Diagnosis not present

## 2022-09-16 DIAGNOSIS — F39 Unspecified mood [affective] disorder: Secondary | ICD-10-CM | POA: Diagnosis not present

## 2022-09-16 DIAGNOSIS — C8118 Nodular sclerosis classical Hodgkin lymphoma, lymph nodes of multiple sites: Secondary | ICD-10-CM | POA: Diagnosis not present

## 2022-09-18 ENCOUNTER — Encounter: Payer: Self-pay | Admitting: Hematology and Oncology

## 2022-09-18 NOTE — Assessment & Plan Note (Signed)
She has no signs of cancer Her weight loss was caused by her mood disorder

## 2022-09-18 NOTE — Assessment & Plan Note (Signed)
Could be due to depression I recommend the patient to take remeron consistently and increase oral intake with frequent small meals

## 2022-09-18 NOTE — Progress Notes (Signed)
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   All questions were answered. The patient knows to call the clinic with any problems, questions or concerns. The total time spent in the appointment was 30 minutes encounter with patients including review of chart and various tests results, discussions about plan of care and coordination of care plan   Artis Delay, MD 09/18/2022 1:30 PM  INTERVAL HISTORY: Please see below for problem oriented charting. she returns for review of CT results  REVIEW OF SYSTEMS:   Constitutional: Denies fevers, chills or abnormal weight loss Eyes: Denies blurriness of vision Ears, nose, mouth, throat, and face: Denies mucositis or sore throat Respiratory: Denies cough, dyspnea or wheezes Cardiovascular: Denies palpitation, chest discomfort or lower extremity swelling Gastrointestinal:  Denies nausea, heartburn or change in bowel habits Skin: Denies abnormal skin rashes Lymphatics: Denies new lymphadenopathy or easy bruising Neurological:Denies numbness, tingling or new weaknesses Behavioral/Psych: Mood is stable, no new changes  All other systems were reviewed with the patient and are negative.  I have reviewed the past medical history, past surgical history, social history and family history with the patient and they are unchanged from previous note.  ALLERGIES:  is allergic to  codeine.  MEDICATIONS:  Current Outpatient Medications  Medication Sig Dispense Refill   albuterol (VENTOLIN HFA) 108 (90 Base) MCG/ACT inhaler Inhale 2 puffs into the lungs every 6 (six) hours as needed for wheezing or shortness of breath. 8 g 2   aspirin EC 81 MG tablet Take 81 mg by mouth daily.     atorvastatin (LIPITOR) 20 MG tablet Take 1 tablet (20 mg total) by mouth daily. 90 tablet 1   cetirizine (ZYRTEC) 10 MG tablet Take 1 tablet (10 mg total) by mouth daily. 90 tablet 1   cholecalciferol (CHOLECALCIFEROL) 25 MCG tablet Take 1 tablet (1,000 Units total) by mouth daily.     cyanocobalamin (VITAMIN B12) 1000 MCG tablet Take 1 tablet (1,000 mcg total) by mouth daily.     denosumab (PROLIA) 60 MG/ML SOSY injection Inject 60 mg into the skin every 6 (six) months.     estradiol (ESTRACE) 0.1 MG/GM vaginal cream Discard plastic applicator. Insert blueberry size amount of cream on finger in vagina daily x1 week then 2x per week.     feeding supplement (ENSURE ENLIVE / ENSURE PLUS) LIQD Take 237 mLs by mouth 3 (three) times daily between meals. 237 mL 12   fluticasone furoate-vilanterol (BREO ELLIPTA) 100-25 MCG/ACT AEPB Inhale 1 puff into the lungs daily. (Patient taking differently: Inhale 1 puff into the lungs daily as needed.) 60 each 5   glucose blood (ACCU-CHEK AVIVA PLUS) test strip Use to Monitor blood sugars daily. DX. E11.9 100 each 5   haloperidol (HALDOL) 1 MG tablet Take 1 tablet (1 mg total) by mouth daily after supper. 30 tablet 3   hydroxypropyl methylcellulose / hypromellose (ISOPTO TEARS / GONIOVISC) 2.5 % ophthalmic solution Place 2  drops into both eyes 3 (three) times daily as needed for dry eyes.     lidocaine-prilocaine (EMLA) cream Apply to affected area once (Patient taking differently: 1 Application as needed. Apply to affected area to port as needed) 30 g 3   LORazepam (ATIVAN) 0.5 MG tablet Take 1 tablet (0.5 mg total) by mouth every 6 (six) hours as needed for  anxiety. 30 tablet 0   mirtazapine (REMERON) 7.5 MG tablet Take 1 tablet (7.5 mg total) by mouth at bedtime. 30 tablet 3   modafinil (PROVIGIL) 200 MG tablet Take 1 tablet (200 mg total) by mouth daily. 30 tablet 3   Multiple Vitamin (MULTIVITAMIN) capsule Take 1 capsule by mouth daily.     OLANZapine (ZYPREXA) 5 MG tablet Take 1 tablet (5 mg total) by mouth at bedtime. 30 tablet 3   omeprazole (PRILOSEC) 20 MG capsule Take 1 capsule (20 mg total) by mouth daily. 90 capsule 1   venlafaxine XR (EFFEXOR XR) 150 MG 24 hr capsule Take 2 capsules (300 mg total) by mouth daily with breakfast. 60 capsule 3   No current facility-administered medications for this visit.    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 Extensive hypermetabolic lymphadenopathy involving the neck, chest and abdomen as detailed above (Deauville 5). I do not see any pelvic disease. 2. Possible involvement of the spleen. 3. Osseous involvement is also noted.     09/03/2020 Pathology Results   A. LYMPH NODE, LEFT NECK, EXCISION:  -Lymphoproliferative disorder consistent with classical Hodgkin lymphoma -See comment   COMMENT:   The sections show effacement of the lymph nodal architecture by a primarily nodular lymphoproliferative process.  In the more cellular areas, the nodules are composed of a mixture of small lymphocytes, plasma cells, abundant histiocytes in addition to atypical large mononuclear and multi-lobated lymphoid appearing cells  with variably prominent nucleoli.  The nodules are surrounded by dense collagenous fibrosis.  In the less cellular areas, there appears to be a fibroblastic proliferation and a much more depleted cellular appearance but scattering of large atypical lymphoid appearing cells is still seen.   Flow cytometric analysis was performed [facility] [phone]) and shows predominance of T lymphocytes with nonspecific changes including relative abundance of CD8 positive cells and reversal of the CD4: CD8 ratio.  No monoclonal B-cell population identified.  A battery of immunohistochemical stains was performed and shows that the large atypical lymphoid appearing cells are positive for CD30, Mum-1, PAX 5 (weak), CD23 in addition to variable but generally weak positivity for CD79a and CD20.  Only scattered large atypical lymphoid cells show weak Golgi positivity for CD15.  The large atypical lymphoid cells are negative for LCA, CD10, CD3, CD5, EMA, ALK protein and EBV in situ  hybridization.  The small lymphoid cells in the background show a mixture of T and B cells with predominance of T cells.  T cells show a mixture of CD4 and CD8 positive cells with slight predominance of the latter.  The overall findings are most consistent with classical Hodgkin lymphoma, nodular sclerosis subtype.  09/21/2020 Procedure   Placement of a subcutaneous port device. Catheter tip at the superior cavoatrial junction.   09/25/2020 Echocardiogram   1. Left ventricular ejection fraction, by estimation, is 60 to 65%. The left ventricle has normal function. The left ventricle has no regional wall motion abnormalities. There is mild concentric left ventricular hypertrophy. Left ventricular diastolic parameters are consistent with Grade I diastolic dysfunction (impaired relaxation). The average left ventricular global longitudinal strain is -19.6 %. The global longitudinal strain is normal.  2. Right ventricular systolic function is normal. The  right ventricular size is normal.  3. A small pericardial effusion is present. The pericardial effusion is surrounding the apex and anterior to the right ventricle. There is no evidence of cardiac tamponade.  4. The mitral valve is normal in structure. No evidence of mitral valve regurgitation.  5. The aortic valve is tricuspid. Aortic valve regurgitation is not visualized. Mild aortic valve sclerosis is present, with no evidence of aortic valve stenosis.  6. The inferior vena cava is normal in size with greater than 50% respiratory variability, suggesting right atrial pressure of 3 mmHg.    Chemotherapy    Patient is on Treatment Plan: HODGKINS LYMPHOMA  A + AVD Q28D       11/23/2020 PET scan   1. Interval significant response to therapy. The previously demonstrated hypermetabolic adenopathy in the neck, chest and abdomen has nearly completely resolved. Deauville 1 and 2. Slightly greater residual metabolic activity within hilar lymph nodes is less specific and may be reactive. 2. Resolution of previously demonstrated focal hypermetabolic activity in the spleen and bones. Diffuse bone marrow activity attributed to treatment response. 3. Stable incidental findings as detailed above.   01/11/2021 Echocardiogram    1. Stable EF and GLS compared to 09/25/20. Left ventricular ejection fraction, by estimation, is 60 to 65%. The left ventricle has normal function. The left ventricle has no regional wall motion abnormalities. Left ventricular diastolic parameters are consistent with Grade I diastolic dysfunction (impaired relaxation). The average left ventricular global longitudinal strain is -19.7 %. The global longitudinal strain is normal.  2. Right ventricular systolic function is normal. The right ventricular size is normal.  3. Left atrial size was mildly dilated.  4. The mitral valve is abnormal. Trivial mitral valve regurgitation. No evidence of mitral stenosis. Moderate mitral annular  calcification.  5. The aortic valve is calcified. There is moderate calcification of the aortic valve. Aortic valve regurgitation is not visualized. Mild to moderate aortic valve sclerosis/calcification is present, without any evidence of aortic stenosis.  6. The inferior vena cava is normal in size with greater than 50% respiratory variability, suggesting right atrial pressure of 3 mmHg.     04/01/2021 PET scan   Resolution of bilateral hilar activity and decreased size of LEFT retroperitoneal lymph node with stable SUV value less than mediastinal blood pool.   Interval development of pulmonary nodules with increased metabolic activity. Findings are nonspecific. Nodule in the RIGHT upper lobe has a branching pattern that could be seen in the setting of infectious or inflammatory changes. This is considered due to relatively rapid development. There is however moderate, relatively higher uptake in the LEFT lower lobe raising the question of neoplasm including lymphoma.   Three-vessel coronary artery disease.   Aortic Atherosclerosis (ICD10-I70.0).     07/01/2021 Imaging   1. Resolution of mediastinal and hilar lymphadenopathy. Minimal residual matted soft tissue density consistent with treated disease. No findings suspicious for residual or recurrent lymphoma. 2.  Small branching nodular lesion in the right upper lobe and subpleural nodularity at the left lung base are stable. Likely benign process. Attention on future scans is suggested. 3. No acute pulmonary findings.   Aortic Atherosclerosis (ICD10-I70.0).     01/03/2022 Imaging   Stable 8 mm left para-aortic lymph node. No new or progressive disease.   Stable subpleural nodularity in the posterior left lower lobe, most likely benign. Recommend continued attention on follow-up imaging.   Stable 4.8 cm benign-appearing left adnexal cyst. Recommend continued attention on follow-up imaging.   Aortic Atherosclerosis (ICD10-I70.0).      09/14/2022 Imaging   CT CHEST ABDOMEN PELVIS W CONTRAST  Result Date: 09/14/2022 CLINICAL DATA:  Severe weight loss. History of non-Hodgkin's lymphoma. * Tracking Code: BO * EXAM: CT CHEST, ABDOMEN, AND PELVIS WITH CONTRAST TECHNIQUE: Multidetector CT imaging of the chest, abdomen and pelvis was performed following the standard protocol during bolus administration of intravenous contrast. RADIATION DOSE REDUCTION: This exam was performed according to the departmental dose-optimization program which includes automated exposure control, adjustment of the mA and/or kV according to patient size and/or use of iterative reconstruction technique. CONTRAST:  75mL OMNIPAQUE IOHEXOL 300 MG/ML  SOLN COMPARISON:  CT 01/03/2022, radiograph 07/02/2022. FINDINGS: CT CHEST FINDINGS Cardiovascular: No significant vascular findings. Normal heart size. No pericardial effusion. Coronary artery calcification and aortic atherosclerotic calcification. Mediastinum/Nodes: No axillary or supraclavicular adenopathy. No mediastinal or hilar adenopathy. No pericardial fluid. Esophagus normal. Port in the anterior chest wall with tip in distal SVC. Lungs/Pleura: No suspicious pulmonary nodules. Normal pleural. Airways normal. Focus of subpleural nodular thickening in the LEFT lower lobe is unchanged prior (image 119/4. Musculoskeletal: No aggressive osseous lesion. CT ABDOMEN AND PELVIS FINDINGS Hepatobiliary: No focal hepatic lesion. Postcholecystectomy. Common bile duct is prominent following cholecystectomy. No obstructing lesion identified. Pancreas: Pancreas is normal. No ductal dilatation. No pancreatic inflammation. Spleen: Normal spleen Adrenals/urinary tract: Adrenal glands and kidneys are normal. The ureters and bladder normal. Stomach/Bowel: Stomach, small bowel, appendix, and cecum are normal. The colon and rectosigmoid colon are normal. Vascular/Lymphatic: Abdominal aorta is normal caliber. There is no retroperitoneal or  periportal lymphadenopathy. No pelvic lymphadenopathy. Reproductive: Uterus normal. Homogeneous ovoid low-density simple fluid attenuation lesion in LEFT adnexal region measures 51 mm x 37 mm. Lesion unchanged from comparison CT. Other: No free fluid. Healing LEFT inferior and superior pubic ramus fractures is new from comparison CT 01/03/2022. Fracture through the LEFT iliac bone along the SI joint is also new from comparison exam. The fracture plane appears corticated along the margins also indicating remote fracture (image 74/2). Pubic ramus fracture identified on radiograph 07/02/2022. IMPRESSION: CHEST IMPRESSION: 1. No evidence of lymphoma recurrence in the thorax. 2. No lymphadenopathy. PELVIS IMPRESSION: 1. No evidence of lymphoma recurrence in the abdomen pelvis. 2. Healing fractures of the LEFT superior and inferior pubic rami and LEFT iliac bone are new from comparison CT 01/03/2022. LEFT pubic ramus fracture noted on comparison radiograph (07/02/22) 3. Stable benign-appearing cystic lesion in the LEFT adnexa. 4.  Aortic Atherosclerosis (ICD10-I70.0). Electronically Signed   By: Genevive Bi M.D.   On: 09/14/2022 16:38      Hodgkin lymphoma, nodular sclerosis  09/14/2020 Initial Diagnosis   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HCC)   09/29/2020 - 03/02/2021 Chemotherapy   Patient is on Treatment Plan : HODGKINS LYMPHOMA  A + AVD q28d       PHYSICAL EXAMINATION: ECOG PERFORMANCE STATUS: 0 - Asymptomatic  Vitals:   09/16/22 1325  BP: 128/72  Pulse: 90  Resp: 18  Temp: (!) 97.5 F (36.4 C)  SpO2: 100%   Filed Weights   09/16/22 1325  Weight: 111 lb 12.8 oz (50.7 kg)    GENERAL:alert, no distress and comfortable  NEURO: alert & oriented x 3 with fluent speech, no focal motor/sensory deficits  LABORATORY DATA:  I have reviewed the data as listed    Component Value Date/Time   NA 140 08/25/2022 0916   NA 143 08/15/2016 1037   K 3.9 08/25/2022 0916   K 4.8 08/15/2016 1037    CL 106 08/25/2022 0916   CL 103 07/16/2012 1304   CO2 28 08/25/2022 0916   CO2 27 08/15/2016 1037   GLUCOSE 114 (H) 08/25/2022 0916   GLUCOSE 111 08/15/2016 1037   GLUCOSE 112 (H) 07/16/2012 1304   BUN 15 08/25/2022 0916   BUN 12.7 08/15/2016 1037   CREATININE 0.61 08/25/2022 0916   CREATININE 0.60 05/24/2022 0858   CREATININE 0.95 (H) 08/10/2020 1041   CREATININE 0.9 08/15/2016 1037   CALCIUM 8.5 (L) 08/25/2022 0916   CALCIUM 10.3 08/13/2020 1117   CALCIUM 10.5 (H) 08/15/2016 1037   PROT 6.9 08/25/2022 0916   PROT 7.9 08/15/2016 1037   ALBUMIN 4.3 08/25/2022 0916   ALBUMIN 4.5 08/15/2016 1037   AST 18 08/25/2022 0916   AST 15 05/24/2022 0858   AST 30 08/15/2016 1037   ALT 14 08/25/2022 0916   ALT 8 05/24/2022 0858   ALT 29 08/15/2016 1037   ALKPHOS 83 08/25/2022 0916   ALKPHOS 67 08/15/2016 1037   BILITOT 0.4 08/25/2022 0916   BILITOT 0.4 05/24/2022 0858   BILITOT 0.80 08/15/2016 1037   GFRNONAA >60 08/25/2022 0916   GFRNONAA >60 05/24/2022 0858   GFRNONAA 62 12/05/2017 1000   GFRAA 72 12/05/2017 1000    No results found for: "SPEP", "UPEP"  Lab Results  Component Value Date   WBC 7.5 08/25/2022   NEUTROABS 5.4 08/25/2022   HGB 13.8 08/25/2022   HCT 41.8 08/25/2022   MCV 91.5 08/25/2022   PLT 249 08/25/2022      Chemistry      Component Value Date/Time   NA 140 08/25/2022 0916   NA 143 08/15/2016 1037   K 3.9 08/25/2022 0916   K 4.8 08/15/2016 1037   CL 106 08/25/2022 0916   CL 103 07/16/2012 1304   CO2 28 08/25/2022 0916   CO2 27 08/15/2016 1037   BUN 15 08/25/2022 0916   BUN 12.7 08/15/2016 1037   CREATININE 0.61 08/25/2022 0916   CREATININE 0.60 05/24/2022 0858   CREATININE 0.95 (H) 08/10/2020 1041   CREATININE 0.9 08/15/2016 1037      Component Value Date/Time   CALCIUM 8.5 (L) 08/25/2022 0916   CALCIUM 10.3 08/13/2020 1117   CALCIUM 10.5 (H) 08/15/2016 1037   ALKPHOS 83 08/25/2022 0916   ALKPHOS 67 08/15/2016 1037   AST 18 08/25/2022  0916   AST 15 05/24/2022 0858   AST 30 08/15/2016 1037   ALT 14 08/25/2022 0916   ALT 8 05/24/2022 0858   ALT 29 08/15/2016 1037   BILITOT 0.4 08/25/2022 0916   BILITOT 0.4 05/24/2022 0858   BILITOT 0.80 08/15/2016 1037       RADIOGRAPHIC STUDIES:I have reviewed multiple imaging studies with patient I have personally reviewed the radiological images as listed and agreed with the findings in the report. CT CHEST ABDOMEN PELVIS W CONTRAST  Result Date: 09/14/2022 CLINICAL DATA:  Severe weight loss. History of  non-Hodgkin's lymphoma. * Tracking Code: BO * EXAM: CT CHEST, ABDOMEN, AND PELVIS WITH CONTRAST TECHNIQUE: Multidetector CT imaging of the chest, abdomen and pelvis was performed following the standard protocol during bolus administration of intravenous contrast. RADIATION DOSE REDUCTION: This exam was performed according to the departmental dose-optimization program which includes automated exposure control, adjustment of the mA and/or kV according to patient size and/or use of iterative reconstruction technique. CONTRAST:  75mL OMNIPAQUE IOHEXOL 300 MG/ML  SOLN COMPARISON:  CT 01/03/2022, radiograph 07/02/2022. FINDINGS: CT CHEST FINDINGS Cardiovascular: No significant vascular findings. Normal heart size. No pericardial effusion. Coronary artery calcification and aortic atherosclerotic calcification. Mediastinum/Nodes: No axillary or supraclavicular adenopathy. No mediastinal or hilar adenopathy. No pericardial fluid. Esophagus normal. Port in the anterior chest wall with tip in distal SVC. Lungs/Pleura: No suspicious pulmonary nodules. Normal pleural. Airways normal. Focus of subpleural nodular thickening in the LEFT lower lobe is unchanged prior (image 119/4. Musculoskeletal: No aggressive osseous lesion. CT ABDOMEN AND PELVIS FINDINGS Hepatobiliary: No focal hepatic lesion. Postcholecystectomy. Common bile duct is prominent following cholecystectomy. No obstructing lesion identified.  Pancreas: Pancreas is normal. No ductal dilatation. No pancreatic inflammation. Spleen: Normal spleen Adrenals/urinary tract: Adrenal glands and kidneys are normal. The ureters and bladder normal. Stomach/Bowel: Stomach, small bowel, appendix, and cecum are normal. The colon and rectosigmoid colon are normal. Vascular/Lymphatic: Abdominal aorta is normal caliber. There is no retroperitoneal or periportal lymphadenopathy. No pelvic lymphadenopathy. Reproductive: Uterus normal. Homogeneous ovoid low-density simple fluid attenuation lesion in LEFT adnexal region measures 51 mm x 37 mm. Lesion unchanged from comparison CT. Other: No free fluid. Healing LEFT inferior and superior pubic ramus fractures is new from comparison CT 01/03/2022. Fracture through the LEFT iliac bone along the SI joint is also new from comparison exam. The fracture plane appears corticated along the margins also indicating remote fracture (image 74/2). Pubic ramus fracture identified on radiograph 07/02/2022. IMPRESSION: CHEST IMPRESSION: 1. No evidence of lymphoma recurrence in the thorax. 2. No lymphadenopathy. PELVIS IMPRESSION: 1. No evidence of lymphoma recurrence in the abdomen pelvis. 2. Healing fractures of the LEFT superior and inferior pubic rami and LEFT iliac bone are new from comparison CT 01/03/2022. LEFT pubic ramus fracture noted on comparison radiograph (07/02/22) 3. Stable benign-appearing cystic lesion in the LEFT adnexa. 4.  Aortic Atherosclerosis (ICD10-I70.0). Electronically Signed   By: Genevive BiStewart  Edmunds M.D.   On: 09/14/2022 16:38

## 2022-09-21 ENCOUNTER — Telehealth: Payer: Self-pay

## 2022-09-21 NOTE — Progress Notes (Signed)
This patient is appearing on the insurance-provided list for being at risk of failing the adherence measure for cholesterol and hypertension (ACEi/ARB) medications this calendar year.   Medication: atorvastatin (no fill history), losartan (DC'd after February discharge)   Outreached patient to discuss adherence and any barriers to adherence.  Patient states she has been taking atorvastatin without issue although there is no dispense history available. No intervention identified at this time.   Valeda Malm, Pharm.D. PGY-2 Ambulatory Care Pharmacy Resident

## 2022-10-02 NOTE — Progress Notes (Unsigned)
BH MD/PA/NP OP Progress Note  10/04/2022 3:02 PM Cathy [name]  MRN:  [id number]  Chief Complaint:  Chief Complaint  Patient presents with   Follow-up   HPI:  This is a follow-up appointment for depression, anxiety.   She states that she has been doing better, but feels anxious.  It tends to be worse especially at night.  She has thoughts such as "I've really messed up."  She wishes to go out more often.  She feels not as happy as she is unable to do things. Although she cannot tell the reason, she tends to back off from activities.  She also states that she is imagining things which is not happening.  When asked to elaborate, she expressed feeling as though somebody had taken her belongings, despite merely misplacing things.  Although she felt it was very real when she was in the hospital, she thinks it has been better.  She also feels that others are very supportive.  She agrees that she has some memory issues.  Although she thought she is not checking the mail, she received a bill again.  She thinks her appetite has been better, and feels good to help weight gain (there is a minimal weigh gain according to the number as below). She eats 3 meals, including [place] sand witch.The patient has mood symptoms as in PHQ-9/GAD-7.  She has occasional insomnia, and may take a nap during the day.  She enjoys working in the yard and gardening.  She went out with her cousin and her friend.  She sees her sister once or twice a week.  She denies SI.  She denies hallucinations.   Her sister, Cathy [name] presents to the visit with the patient consent.  Cathy [name] states that Ander Slade has been isolated, and appears to lose confidence due to lack of interaction with others.  She used to be always involved in the church, helping other people for rides.  She appears to be worried when she is with others.   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, since 2005 Education:   Last PCP / ongoing medical evaluation:   Wt Readings from Last 3 Encounters:  10/04/22 112 lb 9.6 oz (51.1 kg)  09/16/22 111 lb 12.8 oz (50.7 kg)  08/25/22 111 lb 9.6 oz (50.6 kg)     Functional Status Instrumental Activities of Daily Living (IADLs):  Cathy [name] is independent in the following: managing finances, medications (in a pill box), cooking (pre-packaged) Requires assistance with the following: driving (since admission)  Activities of Daily Living (ADLs):  Cathy [name] is independent in the following: bathing and hygiene, feeding, continence, grooming and toileting, walking   Visit Diagnosis:    ICD-10-CM   1. Major depressive disorder, single episode, moderate  F32.1     2. Generalized anxiety disorder  F41.1     3. Neurocognitive deficits  R29.818 Ambulatory referral to Occupational Therapy   R41.89       Past Psychiatric History: Please see initial evaluation for full details. I have reviewed the history. No updates at this time.     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    Grade 2   Vision changes     Past Surgical History:  Procedure Laterality Date   CHOLECYSTECTOMY     IR IMAGING GUIDED PORT INSERTION  09/21/2020   LITHOTRIPSY  1984   LYMPH NODE BIOPSY     MASS BIOPSY Left 09/03/2020   Procedure: LEFT NECK JUGULAR NODE;  Surgeon: Drema Halon,  MD;  Location:  SURGERY CENTER;  Service: ENT;  Laterality: Left;   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 I have reviewed the history. No updates at this time.     Family History:  Family History  Problem Relation Age of Onset   Hypertension Mother    Stroke Mother    Heart attack Mother        CABG, 5 Stents   Dementia Mother    Cancer Father        Kidney   Asthma Sister    Diabetes Brother        Borderline   Alcohol abuse Brother    Cancer Maternal Aunt        stomach ca   Cancer Maternal Aunt        ovarian ca   Breast cancer Cousin    Colon cancer Neg Hx     Social History:  Social History   Socioeconomic History   Marital status: Widowed    Spouse name: Not on file   Number of children: 1   Years of education: Not on file   Highest education level: Some college, no degree  Occupational History   Not on file  Tobacco Use   Smoking status: Never   Smokeless tobacco: Never  Substance and Sexual Activity   Alcohol use: No   Drug use: No   Sexual activity: Not Currently    Birth control/protection: Post-menopausal    Comment: not asked if sexually active  Other Topics Concern   Not on file  Social History Narrative   Lives alone.     Son is Dr. Gilmore Laroche.     Social Determinants of Health   Financial Resource Strain: Not on file  Food Insecurity: No Food Insecurity (07/28/2022)   Hunger Vital Sign    Worried About Running Out of Food in the Last Year: Never true    Ran Out of Food in the Last Year: Never true  Transportation Needs: No Transportation Needs (07/28/2022)   PRAPARE - Administrator, Civil Service (Medical): No    Lack of Transportation (Non-Medical): No  Physical Activity: Not on file  Stress: Not on  file  Social Connections: Not on file    Allergies:  Allergies  Allergen Reactions   Codeine     Patients states when she was young, she was told she "acted weird" after being given codeine    Metabolic Disorder Labs: Lab Results  Component Value Date   HGBA1C 5.5 06/10/2022   MPG 111.15 06/10/2022  MPG 105 04/05/2021   No results found for: "PROLACTIN" Lab Results  Component Value Date   CHOL 246 (H) 06/10/2022   TRIG 94 06/10/2022   HDL 57 06/10/2022   CHOLHDL 4.3 06/10/2022   VLDL 19 06/10/2022   LDLCALC 170 (H) 06/10/2022   LDLCALC 67 04/05/2021   Lab Results  Component Value Date   TSH 1.797 06/10/2022   TSH 1.02 02/17/2022    Therapeutic Level Labs: No results found for: "LITHIUM" No results found for: "VALPROATE" No results found for: "CBMZ"  Current Medications: Current Outpatient Medications  Medication Sig Dispense Refill   albuterol (VENTOLIN HFA) 108 (90 Base) MCG/ACT inhaler Inhale 2 puffs into the lungs every 6 (six) hours as needed for wheezing or shortness of breath. 8 g 2   aspirin EC 81 MG tablet Take 81 mg by mouth daily.     atorvastatin (LIPITOR) 20 MG tablet Take 1 tablet (20 mg total) by mouth daily. 90 tablet 1   cetirizine (ZYRTEC) 10 MG tablet Take 1 tablet (10 mg total) by mouth daily. 90 tablet 1   cholecalciferol (CHOLECALCIFEROL) 25 MCG tablet Take 1 tablet (1,000 Units total) by mouth daily.     cyanocobalamin (VITAMIN B12) 1000 MCG tablet Take 1 tablet (1,000 mcg total) by mouth daily.     denosumab (PROLIA) 60 MG/ML SOSY injection Inject 60 mg into the skin every 6 (six) months.     estradiol (ESTRACE) 0.1 MG/GM vaginal cream Discard plastic applicator. Insert blueberry size amount of cream on finger in vagina daily x1 week then 2x per week.     feeding supplement (ENSURE ENLIVE / ENSURE PLUS) LIQD Take 237 mLs by mouth 3 (three) times daily between meals. 237 mL 12   fluticasone furoate-vilanterol (BREO ELLIPTA) 100-25 MCG/ACT  AEPB Inhale 1 puff into the lungs daily. (Patient taking differently: Inhale 1 puff into the lungs daily as needed.) 60 each 5   glucose blood (ACCU-CHEK AVIVA PLUS) test strip Use to Monitor blood sugars daily. DX. E11.9 100 each 5   haloperidol (HALDOL) 1 MG tablet Take 1 tablet (1 mg total) by mouth daily after supper. 30 tablet 3   hydroxypropyl methylcellulose / hypromellose (ISOPTO TEARS / GONIOVISC) 2.5 % ophthalmic solution Place 2 drops into both eyes 3 (three) times daily as needed for dry eyes.     lidocaine-prilocaine (EMLA) cream Apply to affected area once (Patient taking differently: 1 Application as needed. Apply to affected area to port as needed) 30 g 3   mirtazapine (REMERON) 15 MG tablet Take 1 tablet (15 mg total) by mouth at bedtime. 30 tablet 2   mirtazapine (REMERON) 7.5 MG tablet Take 1 tablet (7.5 mg total) by mouth at bedtime. 30 tablet 3   Multiple Vitamin (MULTIVITAMIN) capsule Take 1 capsule by mouth daily.     OLANZapine (ZYPREXA) 5 MG tablet Take 1 tablet (5 mg total) by mouth at bedtime. 30 tablet 3   omeprazole (PRILOSEC) 20 MG capsule Take 1 capsule (20 mg total) by mouth daily. 90 capsule 1   venlafaxine XR (EFFEXOR XR) 150 MG 24 hr capsule Take 2 capsules (300 mg total) by mouth daily with breakfast. 60 capsule 3   No current facility-administered medications for this visit.     Musculoskeletal: Strength & Muscle Tone: within normal limits Gait & Station: normal Patient leans: N/A  Psychiatric Specialty Exam: Review of Systems  Psychiatric/Behavioral:  Positive for dysphoric mood and sleep disturbance. Negative for agitation, behavioral problems, confusion, decreased  concentration, hallucinations, self-injury and suicidal ideas. The patient is nervous/anxious. The patient is not hyperactive.   All other systems reviewed and are negative.   Blood pressure (!) 149/66, pulse 99, temperature 98 F (36.7 C), temperature source Skin, height  (1.575 m),  weight 112 lb 9.6 oz (51.1 kg).Body mass index is 20.59 kg/m.  General Appearance: Fairly Groomed  Eye Contact:  Good  Speech:  Clear and Coherent  Volume:  Normal  Mood:   better  Affect:  Appropriate, Congruent, and sad at times  Thought Process:  Coherent  Orientation:  Full (Time, Place, and Person)  Thought Content: Logical   Suicidal Thoughts:  No  Homicidal Thoughts:  No  Memory:  Immediate;   Good  Judgement:  Good  Insight:  Good  Psychomotor Activity:  Normal  Concentration:  Concentration: Good and Attention Span: Good  Recall:  Good  Fund of Knowledge: Good  Language: Good  Akathisia:  No  Handed:  Right  AIMS (if indicated): not done  Assets:  Communication Skills Desire for Improvement  ADL's:  Intact  Cognition: WNL  Sleep:   insomnia to hypersomnia   Screenings: AUDIT    Flowsheet Row Admission (Discharged) from 06/14/2022 in [facility]  Alcohol Use Disorder Identification Test Final Score (AUDIT) 0      GAD-7    Flowsheet Row Office Visit from 10/04/2022 in [facility] Office Visit from 08/08/2022 in [facility]  Total GAD-7 Score 7 6      PHQ2-9    Flowsheet Row Office Visit from 10/04/2022 in [facility] Office Visit from 08/08/2022 in [facility] Office Visit from 08/10/2020 in [facility] Office Visit from 01/13/2020 in [facility] Office Visit from 07/16/2019 in [facility]  PHQ-2 Total Score 2 4 0 0 0  PHQ-9 Total Score 8 8 -- -- --      Flowsheet Row Office Visit from 08/08/2022 in [facility] Admission (Discharged) from 06/14/2022 in [facility] ED from 06/10/2022 in [facility]  C-SSRS RISK CATEGORY Error: Q3, 4, or 5 should  not be populated when Q2 is No High Risk No Risk        Assessment and Plan:  PATRICK SOHM is a 77 y.o. year old female with a history of depression, hodgkin lymphoma (diagnosed 09/2020) s/p chemotherapy, who is referred for after care after following admission to [facility].   1. Major depressive disorder, single episode, moderate 2. Generalized anxiety disorder Acute stressors include:  Other stressors include: loss of her husband in 1995 from MI   History: no history of depression/anxiety until 2022,when there is worsening after completion of chemotherapy.  She was admitted to [facility] in Jan-Feb 2024 for depression, severe (with psychotic features.)  COVID in 2022, no long Covid Although there has been overall improvement in depressive symptoms and anxiety, both the patient and her sister reports limited social interaction despite she used to be very active prior to the episode.  Will taper down venlafaxine given her hypertension today, and the fact the dose was higher than the recommended dose, while uptitrating mirtazapine to address weight loss, depression and anxiety.  Discussed potential risk of drowsiness.  She self discontinued modafinil, and will hold this medication at this time due to its limited evidence.  Will continue olanzapine, Haldol  for hallucinations, paranoia.  Will consider trying monotherapy in the near future to avoid polypharmacy.  She was advised again to obtain EKG. although she will greatly benefit from CBT, she is not interested at this time.   3. Neurocognitive deficits She reports some difficulty in IADL.  Will make referral for KELS assessment and treatment if applicable.  The hope is that it would help to regain self-confidence and become more socially active.    Plan Decrease venlafaxine 225 mg daily (office will be contacted if she needs a refill) Increase mirtazapine 15 mg at night Continue olanzapine 5 mg at night (on this medication since discharge) Continue  haloperidol 1 mg daily (on this medication since discharge) Hold modafinl (was on 200 mg daily.she has not taken this since the last visit) Hold lorazepam (she has not taken this) Obtain EKG Next appointment: 7/8 at 9:30 for 30 mins IP Referral to OT    The patient demonstrates the following risk factors for suicide: Chronic risk factors for suicide include: psychiatric disorder of depression . Acute risk factors for suicide include: recent discharge from inpatient psychiatry. Protective factors for this patient include: positive social support, coping skills, and hope for the future. Considering these factors, the overall suicide risk at this point appears to be low. Patient is appropriate for outpatient follow up.      Collaboration of Care: Collaboration of Care: Other reviewed notes in Epic  Patient/Guardian was advised Release of Information must be obtained prior to any record release in order to collaborate their care with an outside provider. Patient/Guardian was advised if they have not already done so to contact the registration department to sign all necessary forms in order for [place] to release information regarding their care.   Consent: Patient/Guardian gives verbal consent for treatment and assignment of benefits for services provided during this visit. Patient/Guardian expressed understanding and agreed to proceed.    Neysa Hotter, MD 10/04/2022, 3:02 PM

## 2022-10-04 ENCOUNTER — Ambulatory Visit (INDEPENDENT_AMBULATORY_CARE_PROVIDER_SITE_OTHER): Payer: Medicare PPO | Admitting: Psychiatry

## 2022-10-04 ENCOUNTER — Encounter: Payer: Self-pay | Admitting: Psychiatry

## 2022-10-04 VITALS — BP 149/66 | HR 99 | Temp 98.0°F | Ht 62.0 in | Wt 112.6 lb

## 2022-10-04 DIAGNOSIS — R4189 Other symptoms and signs involving cognitive functions and awareness: Secondary | ICD-10-CM | POA: Diagnosis not present

## 2022-10-04 DIAGNOSIS — F321 Major depressive disorder, single episode, moderate: Secondary | ICD-10-CM | POA: Diagnosis not present

## 2022-10-04 DIAGNOSIS — F411 Generalized anxiety disorder: Secondary | ICD-10-CM | POA: Diagnosis not present

## 2022-10-04 DIAGNOSIS — R29818 Other symptoms and signs involving the nervous system: Secondary | ICD-10-CM | POA: Diagnosis not present

## 2022-10-04 MED ORDER — MIRTAZAPINE 15 MG PO TABS: 15.0000 mg | ORAL_TABLET | Freq: Every day | ORAL | 2 refills | Status: AC

## 2022-11-09 ENCOUNTER — Inpatient Hospital Stay: Payer: Medicare PPO | Attending: Hematology and Oncology

## 2022-11-21 ENCOUNTER — Other Ambulatory Visit (HOSPITAL_COMMUNITY): Payer: Self-pay

## 2022-11-21 NOTE — Telephone Encounter (Signed)
Pt ready for scheduling for Prolia on or after : 12/01/22  Out-of-pocket cost due at time of visit: $10  Primary: [employer] - Medicare Prolia co-insurance: 100% Admin fee co-insurance: $10  Secondary: N/A Prolia co-insurance:  Admin fee co-insurance:   Medical Benefit Details: Date Benefits were checked: 11/21/22 Deductible: no/ Coinsurance: 100%/ Admin Fee: $10  Prior Auth: approved PA# [id number] Expiration Date: 06/12/23   Pharmacy benefit: Copay $50 If patient wants fill through the pharmacy benefit please send prescription to:  [facility] , and include estimated need by date in rx notes. Pharmacy will ship medication directly to the office.  Patient NOT eligible for Prolia Copay Card. Copay Card can make patient's cost as little as $25. Link to apply: [email or web address]://[email or web address]  ** This summary of benefits is an estimation of the patient's out-of-pocket cost. Exact cost may very based on individual plan coverage.

## 2022-11-21 NOTE — Telephone Encounter (Signed)
Placed a call to [facility] at [phone] to check benefits for Prolia injection.   Per the representative, there is a $10 co-pay, 100% coinsurance, no deductible and a prior authorization is required.   Per test claim, $50 co-pay for Prolia at [facility].

## 2022-11-22 NOTE — Telephone Encounter (Signed)
Left message to return call to our office.  Needs to be set up for after 6/20 for labs and prolia

## 2022-11-23 NOTE — Telephone Encounter (Signed)
Left message to return call to our office.  

## 2022-11-29 NOTE — Telephone Encounter (Signed)
This is 3rd call to patient. Left message to call office. No my chart active. If no call back will send letter to home address and make pcp aware

## 2022-12-02 ENCOUNTER — Encounter: Payer: Self-pay | Admitting: Family Medicine

## 2022-12-02 NOTE — Telephone Encounter (Signed)
Have mailed letter to patient home address on file.

## 2022-12-14 NOTE — Progress Notes (Deleted)
BH MD/PA/NP OP Progress Note  12/14/2022 8:02 AM Cathy [name]  MRN:  [id number]  Chief Complaint: No chief complaint on file.  HPI: *** Visit Diagnosis: No diagnosis found.  Past Psychiatric History: Please see initial evaluation for full details. I have reviewed the history. No updates at this time.    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   Vision changes     Past Surgical History:  Procedure Laterality Date   CHOLECYSTECTOMY     IR IMAGING GUIDED PORT INSERTION  09/21/2020   LITHOTRIPSY  1984   LYMPH NODE BIOPSY     MASS BIOPSY Left 09/03/2020   Procedure: LEFT NECK JUGULAR NODE;  Surgeon: Drema Halon, MD;  Location: [facility];  Service: ENT;  Laterality: Left;   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 I have reviewed the history. No updates at this time.     Family History:  Family History  Problem Relation Age of Onset   Hypertension Mother    Stroke Mother    Heart attack Mother        CABG, 5 Stents   Dementia Mother    Cancer Father        Kidney   Asthma Sister    Diabetes Brother        Borderline   Alcohol abuse Brother    Cancer Maternal Aunt        stomach ca   Cancer Maternal Aunt         ovarian ca   Breast cancer Cousin    Colon cancer Neg Hx     Social History:  Social History   Socioeconomic History   Marital status: Widowed    Spouse name: Not on file   Number of children: 1   Years of education: Not on file   Highest education level: Some college, no degree  Occupational History   Not on file  Tobacco Use   Smoking status: Never   Smokeless tobacco: Never  Substance and Sexual Activity   Alcohol use: No   Drug use: No   Sexual activity: Not Currently    Birth control/protection: Post-menopausal    Comment: not asked if sexually active  Other Topics Concern   Not on file  Social History Narrative   Lives alone.     Son is Dr. Gilmore [name].     Social Determinants of Health   Financial Resource Strain: Not on file  Food Insecurity: No Food Insecurity (07/28/2022)   Hunger Vital Sign    Worried About Running Out of Food in the Last Year: Never true    Ran Out of Food  in the Last Year: Never true  Transportation Needs: No Transportation Needs (07/28/2022)   PRAPARE - Administrator, Civil Service (Medical): No    Lack of Transportation (Non-Medical): No  Physical Activity: Not on file  Stress: Not on file  Social Connections: Not on file    Allergies:  Allergies  Allergen Reactions   Codeine     Patients states when she was young, she was told she "acted weird" after being given codeine    Metabolic Disorder Labs: Lab Results  Component Value Date   HGBA1C 5.5 06/10/2022   MPG 111.15 06/10/2022   MPG 105 04/05/2021   No results found for: "PROLACTIN" Lab Results  Component Value Date   CHOL 246 (H) 06/10/2022   TRIG 94 06/10/2022   HDL 57 06/10/2022   CHOLHDL 4.3 06/10/2022   VLDL 19 06/10/2022   LDLCALC 170 (H) 06/10/2022   LDLCALC 67 04/05/2021   Lab Results  Component Value Date   TSH 1.797 06/10/2022   TSH 1.02 02/17/2022    Therapeutic Level Labs: No results found for: "LITHIUM" No results found for:  "VALPROATE" No results found for: "CBMZ"  Current Medications: Current Outpatient Medications  Medication Sig Dispense Refill   albuterol (VENTOLIN HFA) 108 (90 Base) MCG/ACT inhaler Inhale 2 puffs into the lungs every 6 (six) hours as needed for wheezing or shortness of breath. 8 g 2   aspirin EC 81 MG tablet Take 81 mg by mouth daily.     atorvastatin (LIPITOR) 20 MG tablet Take 1 tablet (20 mg total) by mouth daily. 90 tablet 1   cetirizine (ZYRTEC) 10 MG tablet Take 1 tablet (10 mg total) by mouth daily. 90 tablet 1   cholecalciferol (CHOLECALCIFEROL) 25 MCG tablet Take 1 tablet (1,000 Units total) by mouth daily.     cyanocobalamin (VITAMIN B12) 1000 MCG tablet Take 1 tablet (1,000 mcg total) by mouth daily.     denosumab (PROLIA) 60 MG/ML SOSY injection Inject 60 mg into the skin every 6 (six) months.     estradiol (ESTRACE) 0.1 MG/GM vaginal cream Discard plastic applicator. Insert blueberry size amount of cream on finger in vagina daily x1 week then 2x per week.     feeding supplement (ENSURE ENLIVE / ENSURE PLUS) LIQD Take 237 mLs by mouth 3 (three) times daily between meals. 237 mL 12   fluticasone furoate-vilanterol (BREO ELLIPTA) 100-25 MCG/ACT AEPB Inhale 1 puff into the lungs daily. (Patient taking differently: Inhale 1 puff into the lungs daily as needed.) 60 each 5   glucose blood (ACCU-CHEK AVIVA PLUS) test strip Use to Monitor blood sugars daily. DX. E11.9 100 each 5   haloperidol (HALDOL) 1 MG tablet Take 1 tablet (1 mg total) by mouth daily after supper. 30 tablet 3   hydroxypropyl methylcellulose / hypromellose (ISOPTO TEARS / GONIOVISC) 2.5 % ophthalmic solution Place 2 drops into both eyes 3 (three) times daily as needed for dry eyes.     lidocaine-prilocaine (EMLA) cream Apply to affected area once (Patient taking differently: 1 Application as needed. Apply to affected area to port as needed) 30 g 3   mirtazapine (REMERON) 15 MG tablet Take 1 tablet (15 mg total) by mouth  at bedtime. 30 tablet 2   mirtazapine (REMERON) 7.5 MG tablet Take 1 tablet (7.5 mg total) by mouth at bedtime. 30 tablet 3   Multiple Vitamin (MULTIVITAMIN) capsule Take 1 capsule by mouth daily.     OLANZapine (ZYPREXA) 5 MG tablet Take  1 tablet (5 mg total) by mouth at bedtime. 30 tablet 3   omeprazole (PRILOSEC) 20 MG capsule Take 1 capsule (20 mg total) by mouth daily. 90 capsule 1   venlafaxine XR (EFFEXOR XR) 150 MG 24 hr capsule Take 2 capsules (300 mg total) by mouth daily with breakfast. 60 capsule 3   No current facility-administered medications for this visit.     Musculoskeletal: Strength & Muscle Tone:  normal Gait & Station: normal Patient leans: N/A  Psychiatric Specialty Exam: Review of Systems  There were no vitals taken for this visit.There is no height or weight on file to calculate BMI.  General Appearance: {Appearance:22683}  Eye Contact:  {B[facility] EYE CONTACT:22684}  Speech:  Clear and Coherent  Volume:  Normal  Mood:  {BHH MOOD:22306}  Affect:  {Affect (PAA):22687}  Thought Process:  Coherent  Orientation:  Full (Time, Place, and Person)  Thought Content: Logical   Suicidal Thoughts:  {ST/HT (PAA):22692}  Homicidal Thoughts:  {ST/HT (PAA):22692}  Memory:  Immediate;   Good  Judgement:  {Judgement (PAA):22694}  Insight:  {Insight (PAA):22695}  Psychomotor Activity:  Normal  Concentration:  Concentration: Good and Attention Span: Good  Recall:  Good  Fund of Knowledge: Good  Language: Good  Akathisia:  No  Handed:  Right  AIMS (if indicated): not done  Assets:  Communication Skills Desire for Improvement  ADL's:  Intact  Cognition: WNL  Sleep:  {B[facility] GOOD/FAIR/POOR:22877}   Screenings: AUDIT    Flowsheet Row Admission (Discharged) from 06/14/2022 in [facility]  Alcohol Use Disorder Identification Test Final Score (AUDIT) 0      GAD-7    Flowsheet Row Office Visit from 10/04/2022 in [facility] Office Visit from 08/08/2022 in [facility]  Total GAD-7 Score 7 6      PHQ2-9    Flowsheet Row Office Visit from 10/04/2022 in [facility] Office Visit from 08/08/2022 in [facility] Office Visit from 08/10/2020 in [facility] Office Visit from 01/13/2020 in [facility] Office Visit from 07/16/2019 in [facility]  PHQ-2 Total Score 2 4 0 0 0  PHQ-9 Total Score 8 8 -- -- --      Flowsheet Row Office Visit from 08/08/2022 in [facility] Admission (Discharged) from 06/14/2022 in [facility] ED from 06/10/2022 in [facility]  C-SSRS RISK CATEGORY Error: Q3, 4, or 5 should not be populated when Q2 is No High Risk No Risk        Assessment and Plan:  Cathy MCCRITE is a 77 y.o. year old female with a history of depression, hodgkin lymphoma (diagnosed 09/2020) s/p chemotherapy, who is referred for after care after following admission to [facility].    1. Major depressive disorder, single episode, moderate 2. Generalized anxiety disorder Acute stressors include:  Other stressors include: loss of her husband in 1995 from MI   History: no history of depression/anxiety until 2022,when there is worsening after completion of chemotherapy.  She was admitted to [facility] in Jan-Feb 2024 for depression, severe (with psychotic features.)  COVID in 2022, no long Covid Although there has been overall improvement in depressive symptoms and anxiety, both the patient and her sister reports limited social interaction despite she used to be very active prior to the episode.  Will taper down venlafaxine given her hypertension  today, and the fact the dose was higher than the recommended dose, while uptitrating mirtazapine to address weight loss, depression  and anxiety.  Discussed potential risk of drowsiness.  She self discontinued modafinil, and will hold this medication at this time due to its limited evidence.  Will continue olanzapine, Haldol for hallucinations, paranoia.  Will consider trying monotherapy in the near future to avoid polypharmacy.  She was advised again to obtain EKG. although she will greatly benefit from CBT, she is not interested at this time.    3. Neurocognitive deficits She reports some difficulty in IADL.  Will make referral for KELS assessment and treatment if applicable.  The hope is that it would help to regain self-confidence and become more socially active.    Plan Decrease venlafaxine 225 mg daily (office will be contacted if she needs a refill) Increase mirtazapine 15 mg at night Continue olanzapine 5 mg at night (on this medication since discharge) Continue haloperidol 1 mg daily (on this medication since discharge) Hold modafinl (was on 200 mg daily.she has not taken this since the last visit) Hold lorazepam (she has not taken this) Obtain EKG Next appointment: 7/8 at 9:30 for 30 mins IP Referral to OT     The patient demonstrates the following risk factors for suicide: Chronic risk factors for suicide include: psychiatric disorder of depression . Acute risk factors for suicide include: recent discharge from inpatient psychiatry. Protective factors for this patient include: positive social support, coping skills, and hope for the future. Considering these factors, the overall suicide risk at this point appears to be low. Patient is appropriate for outpatient follow up.     Collaboration of Care: Collaboration of Care: {BH OP Collaboration of Care:21014065}  Patient/Guardian was advised Release of Information must be obtained prior to any record release in order to collaborate their care with an outside provider. Patient/Guardian was advised if they have not already done so to contact the registration department  to sign all necessary forms in order for [place] to release information regarding their care.   Consent: Patient/Guardian gives verbal consent for treatment and assignment of benefits for services provided during this visit. Patient/Guardian expressed understanding and agreed to proceed.    Neysa Hotter, MD 12/14/2022, 8:02 AM

## 2022-12-19 ENCOUNTER — Ambulatory Visit: Payer: Medicare PPO | Admitting: Psychiatry

## 2023-01-04 ENCOUNTER — Other Ambulatory Visit: Payer: Self-pay

## 2023-01-04 ENCOUNTER — Inpatient Hospital Stay: Payer: Medicare PPO | Attending: Hematology and Oncology

## 2023-01-04 DIAGNOSIS — C8118 Nodular sclerosis classical Hodgkin lymphoma, lymph nodes of multiple sites: Secondary | ICD-10-CM

## 2023-01-04 DIAGNOSIS — Z8571 Personal history of Hodgkin lymphoma: Secondary | ICD-10-CM | POA: Diagnosis not present

## 2023-01-04 MED ORDER — HEPARIN SOD (PORK) LOCK FLUSH 100 UNIT/ML IV SOLN
500.0000 [IU] | Freq: Once | INTRAVENOUS | Status: AC
  Administered 2023-01-04: 500 [IU]

## 2023-01-04 MED ORDER — SODIUM CHLORIDE 0.9% FLUSH
10.0000 mL | Freq: Once | INTRAVENOUS | Status: AC
  Administered 2023-01-04: 10 mL

## 2023-03-01 ENCOUNTER — Inpatient Hospital Stay: Payer: Medicare PPO | Attending: Hematology and Oncology

## 2023-03-23 ENCOUNTER — Telehealth: Payer: Self-pay | Admitting: Hematology and Oncology

## 2023-03-23 ENCOUNTER — Telehealth: Payer: Self-pay | Admitting: *Deleted

## 2023-03-23 ENCOUNTER — Inpatient Hospital Stay: Payer: Medicare PPO | Admitting: Hematology and Oncology

## 2023-03-23 ENCOUNTER — Inpatient Hospital Stay: Payer: Medicare PPO | Attending: Hematology and Oncology

## 2023-03-23 NOTE — Telephone Encounter (Signed)
Contacted patient as she missed appts today for port flush, labs and to see Dr. Bertis Ruddy. She said she forgot about the appts. Advised her that [facility] scheduler will contact her to r/s appts within the next 2-3 weeks. She is in agreement with plan to r/s. Schedule message sent

## 2023-03-23 NOTE — Telephone Encounter (Signed)
Patient is aware of rescheduled appointment times/dates due to missed appointment on 10/10

## 2023-04-14 ENCOUNTER — Inpatient Hospital Stay: Payer: Medicare PPO | Admitting: Hematology and Oncology

## 2023-04-14 ENCOUNTER — Inpatient Hospital Stay: Payer: Medicare PPO | Attending: Hematology and Oncology

## 2023-04-14 ENCOUNTER — Encounter: Payer: Self-pay | Admitting: Hematology and Oncology

## 2023-04-14 ENCOUNTER — Inpatient Hospital Stay: Payer: Medicare PPO

## 2023-04-14 VITALS — BP 142/83 | HR 99 | Temp 98.0°F | Resp 18 | Ht 62.0 in | Wt 110.6 lb

## 2023-04-14 DIAGNOSIS — S32592A Other specified fracture of left pubis, initial encounter for closed fracture: Secondary | ICD-10-CM | POA: Diagnosis not present

## 2023-04-14 DIAGNOSIS — R634 Abnormal weight loss: Secondary | ICD-10-CM

## 2023-04-14 DIAGNOSIS — Z7982 Long term (current) use of aspirin: Secondary | ICD-10-CM | POA: Insufficient documentation

## 2023-04-14 DIAGNOSIS — I2510 Atherosclerotic heart disease of native coronary artery without angina pectoris: Secondary | ICD-10-CM | POA: Diagnosis not present

## 2023-04-14 DIAGNOSIS — Z7951 Long term (current) use of inhaled steroids: Secondary | ICD-10-CM | POA: Diagnosis not present

## 2023-04-14 DIAGNOSIS — C8118 Nodular sclerosis classical Hodgkin lymphoma, lymph nodes of multiple sites: Secondary | ICD-10-CM

## 2023-04-14 DIAGNOSIS — I700 Atherosclerosis of aorta: Secondary | ICD-10-CM | POA: Insufficient documentation

## 2023-04-14 DIAGNOSIS — Z9221 Personal history of antineoplastic chemotherapy: Secondary | ICD-10-CM | POA: Diagnosis not present

## 2023-04-14 DIAGNOSIS — Z79899 Other long term (current) drug therapy: Secondary | ICD-10-CM | POA: Insufficient documentation

## 2023-04-14 DIAGNOSIS — I358 Other nonrheumatic aortic valve disorders: Secondary | ICD-10-CM | POA: Diagnosis not present

## 2023-04-14 DIAGNOSIS — F324 Major depressive disorder, single episode, in partial remission: Secondary | ICD-10-CM

## 2023-04-14 DIAGNOSIS — Z8571 Personal history of Hodgkin lymphoma: Secondary | ICD-10-CM | POA: Diagnosis not present

## 2023-04-14 LAB — CBC WITH DIFFERENTIAL/PLATELET
Abs Immature Granulocytes: 0.02 10*3/uL (ref 0.00–0.07)
Basophils Absolute: 0 10*3/uL (ref 0.0–0.1)
Basophils Relative: 0 %
Eosinophils Absolute: 0 10*3/uL (ref 0.0–0.5)
Eosinophils Relative: 0 %
HCT: 47.4 % — ABNORMAL HIGH (ref 36.0–46.0)
Hemoglobin: 16.1 g/dL — ABNORMAL HIGH (ref 12.0–15.0)
Immature Granulocytes: 0 %
Lymphocytes Relative: 17 %
Lymphs Abs: 1.2 10*3/uL (ref 0.7–4.0)
MCH: 29.9 pg (ref 26.0–34.0)
MCHC: 34 g/dL (ref 30.0–36.0)
MCV: 88.1 fL (ref 80.0–100.0)
Monocytes Absolute: 0.6 10*3/uL (ref 0.1–1.0)
Monocytes Relative: 8 %
Neutro Abs: 5.2 10*3/uL (ref 1.7–7.7)
Neutrophils Relative %: 75 %
Platelets: 278 10*3/uL (ref 150–400)
RBC: 5.38 MIL/uL — ABNORMAL HIGH (ref 3.87–5.11)
RDW: 12.9 % (ref 11.5–15.5)
WBC: 7.1 10*3/uL (ref 4.0–10.5)
nRBC: 0 % (ref 0.0–0.2)

## 2023-04-14 LAB — COMPREHENSIVE METABOLIC PANEL
ALT: 14 U/L (ref 0–44)
AST: 20 U/L (ref 15–41)
Albumin: 4.7 g/dL (ref 3.5–5.0)
Alkaline Phosphatase: 73 U/L (ref 38–126)
Anion gap: 9 (ref 5–15)
BUN: 26 mg/dL — ABNORMAL HIGH (ref 8–23)
CO2: 30 mmol/L (ref 22–32)
Calcium: 10.4 mg/dL — ABNORMAL HIGH (ref 8.9–10.3)
Chloride: 102 mmol/L (ref 98–111)
Creatinine, Ser: 0.92 mg/dL (ref 0.44–1.00)
GFR, Estimated: >60 mL/min (ref >60)
Glucose, Bld: 133 mg/dL — ABNORMAL HIGH (ref 70–99)
Potassium: 4.5 mmol/L (ref 3.5–5.1)
Sodium: 141 mmol/L (ref 135–145)
Total Bilirubin: 0.9 mg/dL (ref 0.3–1.2)
Total Protein: 7.6 g/dL (ref 6.5–8.1)

## 2023-04-14 MED ORDER — SODIUM CHLORIDE 0.9% FLUSH
10.0000 mL | Freq: Once | INTRAVENOUS | Status: AC
  Administered 2023-04-14: 10 mL

## 2023-04-14 MED ORDER — HEPARIN SOD (PORK) LOCK FLUSH 100 UNIT/ML IV SOLN
500.0000 [IU] | Freq: Once | INTRAVENOUS | Status: AC
  Administered 2023-04-14: 500 [IU]

## 2023-04-14 NOTE — Assessment & Plan Note (Signed)
Could be due to depression We discussed the role of imaging study but the patient declined

## 2023-04-14 NOTE — Progress Notes (Signed)
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 [facility]) I am concerned about her progressive weight loss The patient attributed that to her mental health situation We discussed the role of CT imaging before port removal but the patient declined Her examination is benign I will get her port removed I will see her back in a year for further follow-up  Unexplained weight loss Could be due to depression We discussed the role of imaging study but the patient declined  Hypercalcemia She has mild hypercalcemia and her hemoglobin is high I suspect there is an element of dehydration Observe closely for now  Orders Placed This Encounter  Procedures   IR REMOVAL TUN ACCESS W/ PORT W/O FL MOD SED    Standing Status:   Future    Standing Expiration Date:   04/13/2024    Order Specific Question:   Reason for exam:    Answer:   no need port    Order Specific Question:   Preferred Imaging Location?    Answer:   [facility]    All questions were answered. The patient knows to call the clinic with any problems, questions or concerns. The total time spent in the appointment was 30 minutes encounter with patients including review of chart and various tests results, discussions about plan of care and coordination of care plan   Artis Delay, MD 04/14/2023 1:04 PM  INTERVAL HISTORY: Please see below for problem oriented charting. she returns for surveillance follow-up with her family She missed her appointment today She looks thinner than the last visit She stated she has been eating well She feels well and does not think she had recurrent cancer Her port is not working today We discussed risk and benefits of imaging  study  REVIEW OF SYSTEMS:   Constitutional: Denies fevers, chills  Eyes: Denies blurriness of vision Ears, nose, mouth, throat, and face: Denies mucositis or sore throat Respiratory: Denies cough, dyspnea or wheezes Cardiovascular: Denies palpitation, chest discomfort or lower extremity swelling Gastrointestinal:  Denies nausea, heartburn or change in bowel habits Skin: Denies abnormal skin rashes Lymphatics: Denies new lymphadenopathy or easy bruising Neurological:Denies numbness, tingling or new weaknesses Behavioral/Psych: Mood is stable, no new changes  All other systems were reviewed with the patient and are negative.  I have reviewed the past medical history, past surgical history, social history and family history with the patient and they are unchanged from previous note.  ALLERGIES:  is allergic to codeine.  MEDICATIONS:  Current Outpatient Medications  Medication Sig Dispense Refill   albuterol (VENTOLIN HFA) 108 (90 Base) MCG/ACT inhaler Inhale 2 puffs into the lungs every 6 (six) hours as needed for wheezing or shortness of breath. 8 g 2   aspirin EC 81 MG tablet Take 81 mg by mouth daily.     atorvastatin (LIPITOR) 20 MG tablet Take 1 tablet (20 mg total) by mouth daily. 90 tablet 1   cetirizine (ZYRTEC) 10 MG tablet Take 1 tablet (10 mg total) by mouth daily. 90 tablet 1   cholecalciferol (CHOLECALCIFEROL) 25 MCG tablet Take 1 tablet (1,000 Units total) by mouth daily.     cyanocobalamin (VITAMIN B12) 1000 MCG tablet Take 1 tablet (1,000 mcg total) by mouth daily.  denosumab (PROLIA) 60 MG/ML SOSY injection Inject 60 mg into the skin every 6 (six) months.     estradiol (ESTRACE) 0.1 MG/GM vaginal cream Discard plastic applicator. Insert blueberry size amount of cream on finger in vagina daily x1 week then 2x per week.     feeding supplement (ENSURE ENLIVE / ENSURE PLUS) LIQD Take 237 mLs by mouth 3 (three) times daily between meals. 237 mL 12   fluticasone  furoate-vilanterol (BREO ELLIPTA) 100-25 MCG/ACT AEPB Inhale 1 puff into the lungs daily. (Patient taking differently: Inhale 1 puff into the lungs daily as needed.) 60 each 5   glucose blood (ACCU-CHEK AVIVA PLUS) test strip Use to Monitor blood sugars daily. DX. E11.9 100 each 5   haloperidol (HALDOL) 1 MG tablet Take 1 tablet (1 mg total) by mouth daily after supper. 30 tablet 3   hydroxypropyl methylcellulose / hypromellose (ISOPTO TEARS / GONIOVISC) 2.5 % ophthalmic solution Place 2 drops into both eyes 3 (three) times daily as needed for dry eyes.     lidocaine-prilocaine (EMLA) cream Apply to affected area once (Patient taking differently: 1 Application as needed. Apply to affected area to port as needed) 30 g 3   mirtazapine (REMERON) 15 MG tablet Take 1 tablet (15 mg total) by mouth at bedtime. 30 tablet 2   mirtazapine (REMERON) 7.5 MG tablet Take 1 tablet (7.5 mg total) by mouth at bedtime. 30 tablet 3   Multiple Vitamin (MULTIVITAMIN) capsule Take 1 capsule by mouth daily.     OLANZapine (ZYPREXA) 5 MG tablet Take 1 tablet (5 mg total) by mouth at bedtime. 30 tablet 3   omeprazole (PRILOSEC) 20 MG capsule Take 1 capsule (20 mg total) by mouth daily. 90 capsule 1   venlafaxine XR (EFFEXOR XR) 150 MG 24 hr capsule Take 2 capsules (300 mg total) by mouth daily with breakfast. 60 capsule 3   No current facility-administered medications for this visit.    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  Extensive hypermetabolic lymphadenopathy involving the neck, chest and abdomen as detailed above (Deauville 5). I do not see any pelvic disease. 2. Possible involvement of the spleen. 3. Osseous involvement is also noted.     09/03/2020 Pathology Results   A. LYMPH NODE, LEFT NECK, EXCISION:  -Lymphoproliferative disorder consistent with classical Hodgkin lymphoma -See comment   COMMENT:   The sections show effacement of the lymph nodal architecture by a primarily nodular lymphoproliferative process.  In the more cellular areas, the nodules are composed of a mixture of small lymphocytes, plasma cells, abundant histiocytes in addition to atypical large mononuclear and multi-lobated lymphoid appearing cells with variably prominent nucleoli.  The nodules are surrounded by dense collagenous fibrosis.  In the less cellular areas, there appears to be a fibroblastic proliferation and a much more depleted cellular appearance but scattering of large atypical lymphoid appearing cells is still seen.   Flow cytometric analysis was performed [facility] [id number]) and shows predominance of T lymphocytes with nonspecific changes including relative abundance of CD8 positive cells and reversal of  the CD4: CD8 ratio.  No monoclonal B-cell population identified.  A battery of immunohistochemical stains was performed and shows that the large atypical lymphoid appearing cells are positive for CD30, Mum-1, PAX 5 (weak), CD23 in addition to variable but generally weak positivity for CD79a and CD20.  Only scattered large atypical lymphoid cells show weak Golgi positivity for CD15.  The large atypical lymphoid cells are negative for LCA, CD10, CD3, CD5, EMA, ALK protein and EBV in situ  hybridization.  The small lymphoid cells in the background show a mixture of T and B cells with predominance of T cells.  T cells show a mixture of CD4 and CD8 positive cells with slight predominance of the latter.  The overall findings are most  consistent with classical Hodgkin lymphoma, nodular sclerosis subtype.      09/21/2020 Procedure   Placement of a subcutaneous port device. Catheter tip at the superior cavoatrial junction.   09/25/2020 Echocardiogram   1. Left ventricular ejection fraction, by estimation, is 60 to 65%. The left ventricle has normal function. The left ventricle has no regional wall motion abnormalities. There is mild concentric left ventricular hypertrophy. Left ventricular diastolic parameters are consistent with Grade I diastolic dysfunction (impaired relaxation). The average left ventricular global longitudinal strain is -19.6 %. The global longitudinal strain is normal.  2. Right ventricular systolic function is normal. The right ventricular size is normal.  3. A small pericardial effusion is present. The pericardial effusion is surrounding the apex and anterior to the right ventricle. There is no evidence of cardiac tamponade.  4. The mitral valve is normal in structure. No evidence of mitral valve regurgitation.  5. The aortic valve is tricuspid. Aortic valve regurgitation is not visualized. Mild aortic valve sclerosis is present, with no evidence of aortic valve stenosis.  6. The inferior vena cava is normal in size with greater than 50% respiratory variability, suggesting right atrial pressure of 3 mmHg.    Chemotherapy    Patient is on Treatment Plan: HODGKINS LYMPHOMA  A + AVD Q28D       11/23/2020 PET scan   1. Interval significant response to therapy. The previously demonstrated hypermetabolic adenopathy in the neck, chest and abdomen has nearly completely resolved. Deauville 1 and 2. Slightly greater residual metabolic activity within hilar lymph nodes is less specific and may be reactive. 2. Resolution of previously demonstrated focal hypermetabolic activity in the spleen and bones. Diffuse bone marrow activity attributed to treatment response. 3. Stable incidental findings as detailed above.    01/11/2021 Echocardiogram    1. Stable EF and GLS compared to 09/25/20. Left ventricular ejection fraction, by estimation, is 60 to 65%. The left ventricle has normal function. The left ventricle has no regional wall motion abnormalities. Left ventricular diastolic parameters are consistent with Grade I diastolic dysfunction (impaired relaxation). The average left ventricular global longitudinal strain is -19.7 %. The global longitudinal strain is normal.  2. Right ventricular systolic function is normal. The right ventricular size is normal.  3. Left atrial size was mildly dilated.  4. The mitral valve is abnormal. Trivial mitral valve regurgitation. No evidence of mitral stenosis. Moderate mitral annular calcification.  5. The aortic valve is calcified. There is moderate calcification of the aortic valve. Aortic valve regurgitation is not visualized. Mild to moderate aortic valve sclerosis/calcification is present, without any evidence of aortic stenosis.  6. The inferior vena cava is normal in size with greater than 50% respiratory variability, suggesting right atrial pressure of  3 mmHg.     04/01/2021 PET scan   Resolution of bilateral hilar activity and decreased size of LEFT retroperitoneal lymph node with stable SUV value less than mediastinal blood pool.   Interval development of pulmonary nodules with increased metabolic activity. Findings are nonspecific. Nodule in the RIGHT upper lobe has a branching pattern that could be seen in the setting of infectious or inflammatory changes. This is considered due to relatively rapid development. There is however moderate, relatively higher uptake in the LEFT lower lobe raising the question of neoplasm including lymphoma.   Three-vessel coronary artery disease.   Aortic Atherosclerosis (ICD10-I70.0).     07/01/2021 Imaging   1. Resolution of mediastinal and hilar lymphadenopathy. Minimal residual matted soft tissue density consistent with treated  disease. No findings suspicious for residual or recurrent lymphoma. 2. Small branching nodular lesion in the right upper lobe and subpleural nodularity at the left lung base are stable. Likely benign process. Attention on future scans is suggested. 3. No acute pulmonary findings.   Aortic Atherosclerosis (ICD10-I70.0).     01/03/2022 Imaging   Stable 8 mm left para-aortic lymph node. No new or progressive disease.   Stable subpleural nodularity in the posterior left lower lobe, most likely benign. Recommend continued attention on follow-up imaging.   Stable 4.8 cm benign-appearing left adnexal cyst. Recommend continued attention on follow-up imaging.   Aortic Atherosclerosis (ICD10-I70.0).     09/14/2022 Imaging   CT CHEST ABDOMEN PELVIS W CONTRAST  Result Date: 09/14/2022 CLINICAL DATA:  Severe weight loss. History of non-Hodgkin's lymphoma. * Tracking Code: BO * EXAM: CT CHEST, ABDOMEN, AND PELVIS WITH CONTRAST TECHNIQUE: Multidetector CT imaging of the chest, abdomen and pelvis was performed following the standard protocol during bolus administration of intravenous contrast. RADIATION DOSE REDUCTION: This exam was performed according to the departmental dose-optimization program which includes automated exposure control, adjustment of the mA and/or kV according to patient size and/or use of iterative reconstruction technique. CONTRAST:  75mL OMNIPAQUE IOHEXOL 300 MG/ML  SOLN COMPARISON:  CT 01/03/2022, radiograph 07/02/2022. FINDINGS: CT CHEST FINDINGS Cardiovascular: No significant vascular findings. Normal heart size. No pericardial effusion. Coronary artery calcification and aortic atherosclerotic calcification. Mediastinum/Nodes: No axillary or supraclavicular adenopathy. No mediastinal or hilar adenopathy. No pericardial fluid. Esophagus normal. Port in the anterior chest wall with tip in distal SVC. Lungs/Pleura: No suspicious pulmonary nodules. Normal pleural. Airways normal. Focus of  subpleural nodular thickening in the LEFT lower lobe is unchanged prior (image 119/4. Musculoskeletal: No aggressive osseous lesion. CT ABDOMEN AND PELVIS FINDINGS Hepatobiliary: No focal hepatic lesion. Postcholecystectomy. Common bile duct is prominent following cholecystectomy. No obstructing lesion identified. Pancreas: Pancreas is normal. No ductal dilatation. No pancreatic inflammation. Spleen: Normal spleen Adrenals/urinary tract: Adrenal glands and kidneys are normal. The ureters and bladder normal. Stomach/Bowel: Stomach, small bowel, appendix, and cecum are normal. The colon and rectosigmoid colon are normal. Vascular/Lymphatic: Abdominal aorta is normal caliber. There is no retroperitoneal or periportal lymphadenopathy. No pelvic lymphadenopathy. Reproductive: Uterus normal. Homogeneous ovoid low-density simple fluid attenuation lesion in LEFT adnexal region measures 51 mm x 37 mm. Lesion unchanged from comparison CT. Other: No free fluid. Healing LEFT inferior and superior pubic ramus fractures is new from comparison CT 01/03/2022. Fracture through the LEFT iliac bone along the SI joint is also new from comparison exam. The fracture plane appears corticated along the margins also indicating remote fracture (image 74/2). Pubic ramus fracture identified on radiograph 07/02/2022. IMPRESSION: CHEST IMPRESSION: 1. No evidence of lymphoma recurrence  in the thorax. 2. No lymphadenopathy. PELVIS IMPRESSION: 1. No evidence of lymphoma recurrence in the abdomen pelvis. 2. Healing fractures of the LEFT superior and inferior pubic rami and LEFT iliac bone are new from comparison CT 01/03/2022. LEFT pubic ramus fracture noted on comparison radiograph (07/02/22) 3. Stable benign-appearing cystic lesion in the LEFT adnexa. 4.  Aortic Atherosclerosis (ICD10-I70.0). Electronically Signed   By: Genevive Bi M.D.   On: 09/14/2022 16:38      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HCC)  09/14/2020 Initial Diagnosis    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HCC)   09/29/2020 - 03/02/2021 Chemotherapy   Patient is on Treatment Plan : HODGKINS LYMPHOMA  A + AVD q28d       PHYSICAL EXAMINATION: ECOG PERFORMANCE STATUS: 0 - Asymptomatic  Vitals:   04/14/23 1209  BP: (!) 142/83  Pulse: 99  Resp: 18  Temp: 98 F (36.7 C)  SpO2: 100%   Filed Weights   04/14/23 1209  Weight: 110 lb 9.6 oz (50.2 kg)    GENERAL:alert, no distress and comfortable.  She looks thin and cachectic SKIN: skin color, texture, turgor are normal, no rashes or significant lesions EYES: normal, Conjunctiva are pink and non-injected, sclera clear OROPHARYNX:no exudate, no erythema and lips, buccal mucosa, and tongue normal  NECK: supple, thyroid normal size, non-tender, without nodularity LYMPH:  no palpable lymphadenopathy in the cervical, axillary or inguinal LUNGS: clear to auscultation and percussion with normal breathing effort HEART: regular rate & rhythm and no murmurs and no lower extremity edema ABDOMEN:abdomen soft, non-tender and normal bowel sounds Musculoskeletal:no cyanosis of digits and no clubbing  NEURO: alert & oriented x 3 with fluent speech, no focal motor/sensory deficits  LABORATORY DATA:  I have reviewed the data as listed    Component Value Date/Time   NA 141 04/14/2023 1203   NA 143 08/15/2016 1037   K 4.5 04/14/2023 1203   K 4.8 08/15/2016 1037   CL 102 04/14/2023 1203   CL 103 07/16/2012 1304   CO2 30 04/14/2023 1203   CO2 27 08/15/2016 1037   GLUCOSE 133 (H) 04/14/2023 1203   GLUCOSE 111 08/15/2016 1037   GLUCOSE 112 (H) 07/16/2012 1304   BUN 26 (H) 04/14/2023 1203   BUN 12.7 08/15/2016 1037   CREATININE 0.92 04/14/2023 1203   CREATININE 0.60 05/24/2022 0858   CREATININE 0.95 (H) 08/10/2020 1041   CREATININE 0.9 08/15/2016 1037   CALCIUM 10.4 (H) 04/14/2023 1203   CALCIUM 10.3 08/13/2020 1117   CALCIUM 10.5 (H) 08/15/2016 1037   PROT 7.6 04/14/2023 1203   PROT 7.9 08/15/2016 1037    ALBUMIN 4.7 04/14/2023 1203   ALBUMIN 4.5 08/15/2016 1037   AST 20 04/14/2023 1203   AST 15 05/24/2022 0858   AST 30 08/15/2016 1037   ALT 14 04/14/2023 1203   ALT 8 05/24/2022 0858   ALT 29 08/15/2016 1037   ALKPHOS 73 04/14/2023 1203   ALKPHOS 67 08/15/2016 1037   BILITOT 0.9 04/14/2023 1203   BILITOT 0.4 05/24/2022 0858   BILITOT 0.80 08/15/2016 1037   GFRNONAA >60 04/14/2023 1203   GFRNONAA >60 05/24/2022 0858   GFRNONAA 62 12/05/2017 1000   GFRAA 72 12/05/2017 1000    No results found for: "SPEP", "UPEP"  Lab Results  Component Value Date   WBC 7.1 04/14/2023   NEUTROABS 5.2 04/14/2023   HGB 16.1 (H) 04/14/2023   HCT 47.4 (H) 04/14/2023   MCV 88.1 04/14/2023   PLT 278 04/14/2023  Chemistry      Component Value Date/Time   NA 141 04/14/2023 1203   NA 143 08/15/2016 1037   K 4.5 04/14/2023 1203   K 4.8 08/15/2016 1037   CL 102 04/14/2023 1203   CL 103 07/16/2012 1304   CO2 30 04/14/2023 1203   CO2 27 08/15/2016 1037   BUN 26 (H) 04/14/2023 1203   BUN 12.7 08/15/2016 1037   CREATININE 0.92 04/14/2023 1203   CREATININE 0.60 05/24/2022 0858   CREATININE 0.95 (H) 08/10/2020 1041   CREATININE 0.9 08/15/2016 1037      Component Value Date/Time   CALCIUM 10.4 (H) 04/14/2023 1203   CALCIUM 10.3 08/13/2020 1117   CALCIUM 10.5 (H) 08/15/2016 1037   ALKPHOS 73 04/14/2023 1203   ALKPHOS 67 08/15/2016 1037   AST 20 04/14/2023 1203   AST 15 05/24/2022 0858   AST 30 08/15/2016 1037   ALT 14 04/14/2023 1203   ALT 8 05/24/2022 0858   ALT 29 08/15/2016 1037   BILITOT 0.9 04/14/2023 1203   BILITOT 0.4 05/24/2022 0858   BILITOT 0.80 08/15/2016 1037

## 2023-04-14 NOTE — Assessment & Plan Note (Signed)
I am concerned about her progressive weight loss The patient attributed that to her mental health situation We discussed the role of CT imaging before port removal but the patient declined Her examination is benign I will get her port removed I will see her back in a year for further follow-up

## 2023-04-14 NOTE — Assessment & Plan Note (Signed)
She has mild hypercalcemia and her hemoglobin is high I suspect there is an element of dehydration Observe closely for now

## 2024-04-16 ENCOUNTER — Inpatient Hospital Stay: Payer: Medicare PPO | Attending: Family Medicine

## 2024-04-16 ENCOUNTER — Inpatient Hospital Stay: Payer: Medicare PPO | Admitting: Hematology and Oncology
